# Patient Record
Sex: Male | Born: 1940
Health system: Southern US, Community
[De-identification: ages and names within clinical notes are randomized; demographics above are authoritative.]

## PROBLEM LIST (undated history)

## (undated) DIAGNOSIS — N201 Calculus of ureter: Secondary | ICD-10-CM

## (undated) DIAGNOSIS — N529 Male erectile dysfunction, unspecified: Secondary | ICD-10-CM

## (undated) DIAGNOSIS — Z87442 Personal history of urinary calculi: Secondary | ICD-10-CM

## (undated) DIAGNOSIS — M1990 Unspecified osteoarthritis, unspecified site: Secondary | ICD-10-CM

## (undated) DIAGNOSIS — E785 Hyperlipidemia, unspecified: Secondary | ICD-10-CM

## (undated) DIAGNOSIS — Z8719 Personal history of other diseases of the digestive system: Secondary | ICD-10-CM

## (undated) DIAGNOSIS — N390 Urinary tract infection, site not specified: Secondary | ICD-10-CM

## (undated) DIAGNOSIS — Z8639 Personal history of other endocrine, nutritional and metabolic disease: Secondary | ICD-10-CM

## (undated) DIAGNOSIS — I10 Essential (primary) hypertension: Secondary | ICD-10-CM

## (undated) DIAGNOSIS — Z860100 Personal history of colon polyps, unspecified: Secondary | ICD-10-CM

## (undated) DIAGNOSIS — A419 Sepsis, unspecified organism: Secondary | ICD-10-CM

## (undated) DIAGNOSIS — K219 Gastro-esophageal reflux disease without esophagitis: Secondary | ICD-10-CM

## (undated) DIAGNOSIS — K567 Ileus, unspecified: Secondary | ICD-10-CM

## (undated) DIAGNOSIS — R7303 Prediabetes: Secondary | ICD-10-CM

## (undated) DIAGNOSIS — N401 Enlarged prostate with lower urinary tract symptoms: Secondary | ICD-10-CM

## (undated) HISTORY — DX: Urinary tract infection, site not specified: N39.0

## (undated) HISTORY — DX: Ileus, unspecified: K56.7

## (undated) HISTORY — DX: Unspecified osteoarthritis, unspecified site: M19.90

## (undated) HISTORY — DX: Hyperlipidemia, unspecified: E78.5

## (undated) HISTORY — DX: Essential (primary) hypertension: I10

## (undated) HISTORY — PX: OTHER SURGICAL HISTORY: SHX169

## (undated) HISTORY — PX: ESOPHAGOGASTRODUODENOSCOPY (EGD) WITH ESOPHAGEAL DILATION: SHX5812

## (undated) HISTORY — DX: Gastro-esophageal reflux disease without esophagitis: K21.9

## (undated) HISTORY — DX: Sepsis, unspecified organism: A41.9

## (undated) HISTORY — PX: INGUINAL HERNIA REPAIR: SUR1180

---

## 1999-03-05 ENCOUNTER — Encounter: Payer: Self-pay | Admitting: *Deleted

## 1999-03-05 ENCOUNTER — Ambulatory Visit (HOSPITAL_COMMUNITY): Admission: RE | Admit: 1999-03-05 | Discharge: 1999-03-05 | Payer: Self-pay | Admitting: Pediatrics

## 1999-03-10 ENCOUNTER — Encounter: Payer: Self-pay | Admitting: *Deleted

## 1999-03-10 ENCOUNTER — Ambulatory Visit (HOSPITAL_COMMUNITY): Admission: RE | Admit: 1999-03-10 | Discharge: 1999-03-10 | Payer: Self-pay | Admitting: *Deleted

## 1999-03-10 ENCOUNTER — Encounter (INDEPENDENT_AMBULATORY_CARE_PROVIDER_SITE_OTHER): Payer: Self-pay

## 1999-11-24 ENCOUNTER — Encounter: Payer: Self-pay | Admitting: Neurosurgery

## 1999-11-24 ENCOUNTER — Encounter: Admission: RE | Admit: 1999-11-24 | Discharge: 1999-11-24 | Payer: Self-pay | Admitting: Neurosurgery

## 2004-04-28 ENCOUNTER — Ambulatory Visit: Payer: Self-pay

## 2005-01-14 ENCOUNTER — Ambulatory Visit: Payer: Self-pay | Admitting: Gastroenterology

## 2005-01-26 ENCOUNTER — Ambulatory Visit (HOSPITAL_BASED_OUTPATIENT_CLINIC_OR_DEPARTMENT_OTHER): Admission: RE | Admit: 2005-01-26 | Discharge: 2005-01-26 | Payer: Self-pay | Admitting: Urology

## 2005-01-26 ENCOUNTER — Ambulatory Visit (HOSPITAL_COMMUNITY): Admission: RE | Admit: 2005-01-26 | Discharge: 2005-01-26 | Payer: Self-pay | Admitting: Urology

## 2005-01-26 HISTORY — PX: OTHER SURGICAL HISTORY: SHX169

## 2005-01-29 ENCOUNTER — Ambulatory Visit (HOSPITAL_COMMUNITY): Admission: RE | Admit: 2005-01-29 | Discharge: 2005-01-29 | Payer: Self-pay | Admitting: Urology

## 2005-02-16 ENCOUNTER — Ambulatory Visit (HOSPITAL_COMMUNITY): Admission: RE | Admit: 2005-02-16 | Discharge: 2005-02-16 | Payer: Self-pay | Admitting: Urology

## 2005-02-17 ENCOUNTER — Ambulatory Visit: Payer: Self-pay | Admitting: Gastroenterology

## 2005-02-17 ENCOUNTER — Encounter (INDEPENDENT_AMBULATORY_CARE_PROVIDER_SITE_OTHER): Payer: Self-pay | Admitting: Specialist

## 2005-02-17 HISTORY — PX: COLONOSCOPY: SHX174

## 2005-05-04 ENCOUNTER — Ambulatory Visit (HOSPITAL_BASED_OUTPATIENT_CLINIC_OR_DEPARTMENT_OTHER): Admission: RE | Admit: 2005-05-04 | Discharge: 2005-05-04 | Payer: Self-pay | Admitting: Urology

## 2005-05-04 HISTORY — PX: CYSTOSCOPY W/ URETEROSCOPY W/ LITHOTRIPSY: SUR380

## 2007-06-13 ENCOUNTER — Ambulatory Visit: Payer: Self-pay | Admitting: Gastroenterology

## 2007-07-01 ENCOUNTER — Encounter: Payer: Self-pay | Admitting: Gastroenterology

## 2007-07-01 ENCOUNTER — Ambulatory Visit: Payer: Self-pay | Admitting: Gastroenterology

## 2008-04-16 ENCOUNTER — Ambulatory Visit (HOSPITAL_COMMUNITY): Admission: RE | Admit: 2008-04-16 | Discharge: 2008-04-16 | Payer: Self-pay | Admitting: Internal Medicine

## 2008-09-18 ENCOUNTER — Encounter: Payer: Self-pay | Admitting: Gastroenterology

## 2008-10-18 ENCOUNTER — Ambulatory Visit: Payer: Self-pay | Admitting: Gastroenterology

## 2008-10-18 DIAGNOSIS — K222 Esophageal obstruction: Secondary | ICD-10-CM

## 2008-10-18 HISTORY — DX: Esophageal obstruction: K22.2

## 2008-11-07 ENCOUNTER — Ambulatory Visit: Payer: Self-pay | Admitting: Gastroenterology

## 2009-12-25 ENCOUNTER — Ambulatory Visit (HOSPITAL_COMMUNITY): Admission: RE | Admit: 2009-12-25 | Discharge: 2009-12-25 | Payer: Self-pay | Admitting: Internal Medicine

## 2010-01-28 ENCOUNTER — Encounter: Payer: Self-pay | Admitting: Gastroenterology

## 2010-04-01 NOTE — Procedures (Signed)
Summary: Colonoscopy   Colonoscopy  Procedure date:  02/17/2005  Findings:      Results: Diverticulosis.       Pathology:  Hyperplastic polyp.     Location:  [facility].    Comments:      Repeat colonoscopy in 5 years.  Patient Name: [name] [name], [name] [name]. MRN:  Procedure Procedures: Colonoscopy CPT: [phone].    with Hot Biopsy(s)CPT: [id number].  Colorectal cancer screening, average risk CPT: G0121.  Personnel: Endoscopist: Barbette Hair. Arlyce Dice, MD.  Patient Consent: Procedure, Alternatives, Risks and Benefits discussed, consent obtained, from patient.  Indications  Average Risk Screening Routine.  History  Current Medications: Patient is not currently taking Coumadin.  Pre-Exam Physical: Performed Feb 17, 2005. Cardio-pulmonary exam, HEENT exam , Abdominal exam, Mental status exam WNL.  Comments: Patient history reviewed/updated, physical performed prior to initiation of sedation? Exam Exam: Extent of exam reached: Cecum, extent intended: Cecum.  The cecum was identified by IC valve. Colon retroflexion performed. ASA Classification: II. Tolerance: good.  Monitoring: Pulse and BP monitoring, Oximetry used. Supplemental O2 given. at 2 Liters.  Colon Prep Used Miralax for colon prep. Prep results: good.  Sedation Meds: Patient assessed and found to be appropriate for moderate (conscious) sedation. Sedation was managed by the Endoscopist. Fentanyl 100 mcg. given IV. Versed 10 mg. given IV.  Findings - DIVERTICULOSIS: Descending Colon to Sigmoid Colon. ICD9: Diverticulosis: 562.10. Comments: Diffuse diverticulosis.  NORMAL EXAM: Cecum.  - NORMAL EXAM: Cecum to Descending Colon.  POLYP: Rectum, Maximum size: 2 mm. Distance from Anus 4 cm. Procedure:  hot biopsy, ICD9: Colon Polyps: 211.3.  NORMAL EXAM: Rectum.   Assessment Abnormal examination, see findings above.  Diagnoses: 562.10: Diverticulosis.  211.3: Colon Polyps.   Events  Unplanned  Interventions: No intervention was required.  Unplanned Events: There were no complications. Plans  Post Exam Instructions: Post sedation instructions given.  Patient Education: Patient given standard instructions for: Polyps. Diverticulosis.  Disposition: After procedure patient sent to recovery. After recovery patient sent home.  Scheduling/Referral: Colonoscopy, to Barbette Hair. Arlyce Dice, MD, If adenoma, around Feb 17, 2010.    This report was created from the original endoscopy report, which was reviewed and signed by the above listed endoscopist.    cc. Shela Nevin

## 2010-04-01 NOTE — Letter (Signed)
Summary: Colonoscopy Date Change Letter  [facility]  [address] [place], [place] [address]   Phone: [phone]  Fax: [phone]      January 28, 2010 MRN: [id number]   BRYCETON HANTZ [address] [place], [place]  [address]   Dear Mr. FIDALGO,   Previously you were recommended to have a repeat colonoscopy around this time. Your chart was recently reviewed by Dr. Arlyce Dice of [facility]. Follow up colonoscopy is now recommended in 01-2015. This revised recommendation is based on current, nationally recognized guidelines for colorectal cancer screening and polyp surveillance. These guidelines are endorsed by the American Cancer Society, [employer] on Colorectal Cancer as well as numerous other major medical organizations.  Please understand that our recommendation assumes that you do not have any new symptoms such as bleeding, a change in bowel habits, anemia, or significant abdominal discomfort. If you do have any concerning GI symptoms or want to discuss the guideline recommendations, please call to arrange an office visit at your earliest convenience. Otherwise we will keep you in our reminder system and contact you 1-2 months prior to the date listed above to schedule your next colonoscopy.  Thank you,  Barbette Hair. Arlyce Dice, M.D.  [facility] [phone]

## 2010-06-11 ENCOUNTER — Other Ambulatory Visit (HOSPITAL_COMMUNITY): Payer: Self-pay | Admitting: Internal Medicine

## 2010-06-23 ENCOUNTER — Encounter (HOSPITAL_COMMUNITY)
Admission: RE | Admit: 2010-06-23 | Discharge: 2010-06-23 | Disposition: A | Discharge status: Home / Self Care | Payer: Medicare Other | Source: Ambulatory Visit | Attending: Internal Medicine | Admitting: Internal Medicine

## 2010-06-23 MED ORDER — TECHNETIUM TC 99M MEDRONATE IV KIT
25.0000 | PACK | Freq: Once | INTRAVENOUS | Status: AC | PRN
  Administered 2010-06-23: 26.5 via INTRAVENOUS

## 2010-07-08 ENCOUNTER — Telehealth: Payer: Self-pay | Admitting: Gastroenterology

## 2010-07-08 NOTE — Telephone Encounter (Signed)
Pts wife states that the pt is having difficulty swallowing. She would like to schedule him for an EGD with dil. Does pt need an OV or just a direct procedure. Please advise.

## 2010-07-09 ENCOUNTER — Encounter: Payer: Self-pay | Admitting: Gastroenterology

## 2010-07-09 NOTE — Telephone Encounter (Signed)
If no intervening medical problems he can be directly scheduled

## 2010-07-09 NOTE — Telephone Encounter (Signed)
Pt scheduled for previsit for 07/11/10@3 :30pm, EGD with dil scheduled for 07/21/10@1 :30pm. Pt aware of appt dates and times.

## 2010-07-11 ENCOUNTER — Ambulatory Visit (AMBULATORY_SURGERY_CENTER): Payer: Medicare Other | Admitting: *Deleted

## 2010-07-11 ENCOUNTER — Encounter: Payer: Self-pay | Admitting: Gastroenterology

## 2010-07-11 DIAGNOSIS — R1310 Dysphagia, unspecified: Secondary | ICD-10-CM

## 2010-07-15 NOTE — Letter (Signed)
June 13, 2007    Lucky Cowboy, M.D.  [address]  [place], [place] [address]   RE:  [name], [name]  MRN:  [id number]  /  DOB:  1940/12/17   Dear Dr. Oneta Rack:   Upon your kind referral, I had the pleasure of evaluating your patient  and I am pleased to offer my findings.  I saw [name] [name] in the office  today.  Enclosed is a copy of my progress note that details my findings  and recommendations.   Thank you for the opportunity to participate in your patient's care.    Sincerely,      Barbette Hair. Arlyce Dice, MD,FACG  Electronically Signed    RDK/MedQ  DD: 06/13/2007  DT: 06/13/2007  Job #: [id number]

## 2010-07-15 NOTE — Assessment & Plan Note (Signed)
[facility]                         GASTROENTEROLOGY OFFICE NOTE   [name] [name], [name] [name]                        MRN:          [id number]  DATE:06/13/2007                            DOB:          07-Feb-1941    REFERRING PHYSICIAN:  Lucky Cowboy, M.D.   PROBLEM:  Dysphagia.   [name] [name] is a pleasant 70 year old white male referred through the  courtesy of Dr. Oneta Rack for evaluation.  He has a long history of mild  pyrosis.  Over the past 4-5 months, he has been complaining of  intermittent dysphagia to solids.  He denies odynophagia.  Symptoms are  improved with Protonix, though they remain.  He tends to have a cough in  the mornings.   PAST MEDICAL HISTORY:  1. Hypertension.  2. Hypercholesterolemia.  3. He has a history of kidney stones for which he has undergone      lithotripsy therapy.  4. He has a history of colon polyps and diverticulosis.   FAMILY HISTORY:  Pertinent for a father with heart disease and a sister  with diabetes.   MEDICATIONS:  Baby aspirin, atenolol, and Protonix.   HE IS ALLERGIC TO IVP DYE.   He does not smoke.  He drinks about 1 drink a day.  He is married and  works in [employer], political party.   REVIEW OF SYSTEMS:  Reviewed and is positive for back pain and vision  changes, otherwise it is negative.   PHYSICAL EXAMINATION:  Pulse 56, blood pressure 122/84, weight 260.  HEENT: EOMI.  PERRLA.  Sclerae are anicteric.  Conjunctivae are pink.  NECK:  Supple without thyromegaly, adenopathy or carotid bruits.  CHEST:  Clear to auscultation and percussion without adventitious  sounds.  CARDIAC:  Regular rhythm; normal S1 S2.  There are no murmurs, gallops  or rubs.  ABDOMEN:  Bowel sounds are normoactive.  Abdomen is soft, nontender and  nondistended.  There are no abdominal masses, tenderness, splenic  enlargement or hepatomegaly.  EXTREMITIES:  Full range of motion.  No cyanosis, clubbing or edema.  RECTAL:  Deferred.  SKULL:  Exam shows no deformities.  NEUROLOGIC:  No focal abnormalities.   IMPRESSION:  1. Dysphagia - rule out early esophageal stricture.  2. Gastroesophageal reflux disease.  3. History of colon polyps and diverticulosis.  4. Hypertension.   RECOMMENDATION:  Upper endoscopy with dilatation as indicated.     [name] [name]. Arlyce Dice, MD,FACG  Electronically Signed    RDK/MedQ  DD: 06/13/2007  DT: 06/13/2007  Job #: [id number]   cc:   Lucky Cowboy, M.D.

## 2010-07-18 NOTE — Op Note (Signed)
NAME:  [name] [name], [name] [name] NO.:  [id number]   MEDICAL RECORD NO.:  [id number]          PATIENT TYPE:  AMB   LOCATION:  [facility]                         FACILITY:  [facility]   PHYSICIAN:  Bertram Millard. Dahlstedt, M.D.DATE OF BIRTH:  1940-07-18   DATE OF PROCEDURE:  01/26/2005  DATE OF DISCHARGE:                                 OPERATIVE REPORT   PREOPERATIVE DIAGNOSIS:  Large left ureteropelvic junction stone.   POSTOPERATIVE DIAGNOSIS:  Large left ureteropelvic junction stone.   PRINCIPAL PROCEDURE:  Cystoscopy, left retrograde ureteral pyelogram, double-  J stent placement on the left.   SURGEON:  Bertram Millard. Dahlstedt, M.D.   ANESTHESIA:  General with LMA.   COMPLICATIONS:  None.   BRIEF HISTORY:  A 70 year old male with a history of hematuria. He was  recently evaluated and found to have a large left ureteral stone at the UPJ.  This was approximately 2 cm in length. It is a slender stone, however.   It was recommended when this diagnosis was made that the patient undergo  either cystoscopy with double-J stent followed by lithotripsy versus  lithotripsy in situ. I have recommended, due to the stone bulk, that he have  a double-J stent placed beforehand. He desires to proceed with this  recommendation.   DESCRIPTION OF PROCEDURE:  The patient was administered general anesthetic  using the LMA. He was placed in the dorsal lithotomy position. Genitalia and  perineum were prepped and draped. A 22-French panendoscope was advanced into  the bladder which was found to be normal. The left ureteral orifice was  cannulated with a 6-French open-end catheter and a retrograde ureteral  pyelogram performed.   The retrograde ureteral pyelogram revealed a normal ureter. There was a  filling defect in the proximal ureter. There was moderate hydronephrosis  proximal to this.   A guidewire was then advanced through the open-ended catheter and guided by  the stone. The catheter was  removed and a double-J stent was placed. The  stent size was 6 [place] x 26 cm. Good proximal and distal curls were seen  using fluoroscopic and cystoscopic guidance. The bladder was drained and the  scope was removed.   The patient tolerated the procedure well. A B&O suppository was placed.   He was discharged on Bactrim DS 1 p.o. b.i.d. for 3 days and oxybutynin 5 mg  1 p.o. q.8 h p.r.n. bladder frequency. He will follow-up with lithotripsy in  approximately 3 days.      Bertram Millard. Dahlstedt, M.D.  Electronically Signed     SMD/MEDQ  D:  01/26/2005  T:  01/26/2005  Job:  [id number]

## 2010-07-18 NOTE — Op Note (Signed)
NAME:  [name] [name], [name] [name] NO.:  [id number]   MEDICAL RECORD NO.:  [id number]          PATIENT TYPE:  AMB   LOCATION:  [facility]                         FACILITY:  [facility]   PHYSICIAN:  Bertram Millard. Dahlstedt, M.D.DATE OF BIRTH:  06/07/1940   DATE OF PROCEDURE:  05/04/2005  DATE OF DISCHARGE:                                 OPERATIVE REPORT   PREOPERATIVE DIAGNOSIS:  Ureteral calculi.   POSTOPERATIVE DIAGNOSIS:  Ureteral calculi.   PROCEDURES:  1.  Cytoscopy.  2.  Left ureteroscopy.  3.  Holmium laser of ureteral calculi.  4.  Extraction of left ureteral calculi.  5.  Double-J stent placement.   SURGEON:  Bertram Millard. Dahlstedt, M.D.   ANESTHESIA:  General.   COMPLICATIONS:  None.   SPECIMENS:  Ureteral stone.   BRIEF HISTORY:  This nice 70 year old gentleman is status post 2 prior  lithotripsies for an obstructing left upper ureteral stone. He has had  fragmentation, but insufficient passage of the calculi. He was recently  seen; and he has persistent stones in his left kidney, but does not have  significant symptomatology.   With the patient having these stones for several months, it was recommended  that he undergo left ureteroscopy and stone extraction, with the possible  use of the holmium laser. He accepts the procedures as well as the risks and  complications and desires to proceed.   DESCRIPTION OF PROCEDURE:  The patient was administered preoperative IV  antibiotics. He was identified; and the surgical side marked in the holding  area. He was brought to the operating room where a general anesthetic was  administered. He was placed in the dorsal lithotomy position. Genitalia and  the perineum were prepped and draped. A 22-French panendoscope was advanced  into his bladder. Prostate was not obstructive. Left ureteral orifice was  identified; and there was a stone peeking through it. A sensor tip guidewire  was then passed up into the left renal pelvis using  fluoroscopic guidance.   The 6-French, short ureteroscope was passed. The distal ureteral stone was  extracted. The ureteroscope was then, again, advanced up to the stones in  the mid ureter at the pelvic brim. The several small 3-4 mm fragments were  grasped with the nitinol basket and extracted. There was an approximate 8-10  mm stone that was farther up. This was fragmented using the holmium laser  into several smaller fragments. With some difficulty, these were then  basketed and extracted. Several minimal/dusk-like fragments were left. These  were not extracted. Altogether, approximately a dozen fragments were removed  from the left ureter.   No significant ureteral trauma was identified. I then placed a 26 cm x 5-  French Polaris stent over the guidewire which had been left indwelling. The  guidewire was removed. A good proximal curl was seen. The small fronds were  seen coming through the left ureteral orifice. The bladder was drained. The  scope was removed, and the procedure terminated.   The patient tolerated the procedure well. He was awakened, extubated, and  taken to PACU in stable condition.  Bertram Millard. Dahlstedt, M.D.  Electronically Signed     SMD/MEDQ  D:  05/04/2005  T:  05/04/2005  Job:  [id number]

## 2010-07-21 ENCOUNTER — Ambulatory Visit (AMBULATORY_SURGERY_CENTER): Payer: Medicare Other | Admitting: Gastroenterology

## 2010-07-21 ENCOUNTER — Encounter: Payer: Self-pay | Admitting: Gastroenterology

## 2010-07-21 VITALS — BP 131/87 | HR 69 | Temp 98.6°F | Resp 16 | Ht 71.0 in | Wt 260.0 lb

## 2010-07-21 DIAGNOSIS — K222 Esophageal obstruction: Secondary | ICD-10-CM

## 2010-07-21 DIAGNOSIS — R1310 Dysphagia, unspecified: Secondary | ICD-10-CM

## 2010-07-21 MED ORDER — SODIUM CHLORIDE 0.9 % IV SOLN: 500.0000 mL | INTRAVENOUS | Status: DC

## 2010-07-21 NOTE — Patient Instructions (Signed)
Dilation diet given to pt and care partner  Handout given on strictures  Continue medicines as directed  Call as needed for problems

## 2010-07-22 ENCOUNTER — Telehealth: Payer: Self-pay | Admitting: *Deleted

## 2010-07-22 NOTE — Telephone Encounter (Signed)

## 2011-06-01 ENCOUNTER — Other Ambulatory Visit: Payer: Self-pay | Admitting: Internal Medicine

## 2011-06-01 DIAGNOSIS — N6452 Nipple discharge: Secondary | ICD-10-CM

## 2011-06-04 ENCOUNTER — Ambulatory Visit
Admission: RE | Admit: 2011-06-04 | Discharge: 2011-06-04 | Disposition: A | Discharge status: Home / Self Care | Payer: Medicare Other | Source: Ambulatory Visit | Attending: Internal Medicine | Admitting: Internal Medicine

## 2011-06-04 DIAGNOSIS — N6452 Nipple discharge: Secondary | ICD-10-CM

## 2012-12-27 ENCOUNTER — Other Ambulatory Visit: Payer: Self-pay | Admitting: Emergency Medicine

## 2012-12-27 ENCOUNTER — Ambulatory Visit (HOSPITAL_COMMUNITY)
Admission: RE | Admit: 2012-12-27 | Discharge: 2012-12-27 | Disposition: A | Discharge status: Home / Self Care | Payer: Medicare Other | Source: Ambulatory Visit | Attending: Emergency Medicine | Admitting: Emergency Medicine

## 2012-12-27 DIAGNOSIS — IMO0001 Reserved for inherently not codable concepts without codable children: Secondary | ICD-10-CM | POA: Insufficient documentation

## 2012-12-27 DIAGNOSIS — M259 Joint disorder, unspecified: Secondary | ICD-10-CM | POA: Insufficient documentation

## 2012-12-27 DIAGNOSIS — M25552 Pain in left hip: Secondary | ICD-10-CM

## 2012-12-27 DIAGNOSIS — M79609 Pain in unspecified limb: Secondary | ICD-10-CM | POA: Insufficient documentation

## 2013-01-28 ENCOUNTER — Other Ambulatory Visit: Payer: Self-pay | Admitting: Internal Medicine

## 2013-02-19 ENCOUNTER — Other Ambulatory Visit: Payer: Self-pay | Admitting: Internal Medicine

## 2013-03-12 ENCOUNTER — Encounter: Payer: Self-pay | Admitting: *Deleted

## 2013-03-16 ENCOUNTER — Encounter: Payer: Self-pay | Admitting: Emergency Medicine

## 2013-03-16 ENCOUNTER — Ambulatory Visit (INDEPENDENT_AMBULATORY_CARE_PROVIDER_SITE_OTHER): Payer: Medicare Other | Admitting: Emergency Medicine

## 2013-03-16 VITALS — BP 124/62 | HR 64 | Temp 98.2°F | Resp 18 | Ht 71.5 in | Wt 264.0 lb

## 2013-03-16 DIAGNOSIS — R7309 Other abnormal glucose: Secondary | ICD-10-CM

## 2013-03-16 DIAGNOSIS — E669 Obesity, unspecified: Secondary | ICD-10-CM

## 2013-03-16 DIAGNOSIS — E785 Hyperlipidemia, unspecified: Secondary | ICD-10-CM

## 2013-03-16 DIAGNOSIS — N200 Calculus of kidney: Secondary | ICD-10-CM

## 2013-03-16 DIAGNOSIS — K219 Gastro-esophageal reflux disease without esophagitis: Secondary | ICD-10-CM

## 2013-03-16 DIAGNOSIS — M1990 Unspecified osteoarthritis, unspecified site: Secondary | ICD-10-CM

## 2013-03-16 DIAGNOSIS — E782 Mixed hyperlipidemia: Secondary | ICD-10-CM

## 2013-03-16 DIAGNOSIS — I10 Essential (primary) hypertension: Secondary | ICD-10-CM

## 2013-03-16 LAB — CBC WITH DIFFERENTIAL/PLATELET
Basophils Absolute: 0 10*3/uL (ref 0.0–0.1)
Basophils Relative: 1 % (ref 0–1)
Eosinophils Absolute: 0.5 10*3/uL (ref 0.0–0.7)
Eosinophils Relative: 6 % — ABNORMAL HIGH (ref 0–5)
HCT: 46.4 % (ref 39.0–52.0)
Hemoglobin: 16.4 g/dL (ref 13.0–17.0)
Lymphocytes Relative: 25 % (ref 12–46)
Lymphs Abs: 2.1 10*3/uL (ref 0.7–4.0)
MCH: 33.1 pg (ref 26.0–34.0)
MCHC: 35.3 g/dL (ref 30.0–36.0)
MCV: 93.5 fL (ref 78.0–100.0)
Monocytes Absolute: 0.8 10*3/uL (ref 0.1–1.0)
Monocytes Relative: 10 % (ref 3–12)
Neutro Abs: 4.8 10*3/uL (ref 1.7–7.7)
Neutrophils Relative %: 58 % (ref 43–77)
Platelets: 298 10*3/uL (ref 150–400)
RBC: 4.96 MIL/uL (ref 4.22–5.81)
RDW: 13.1 % (ref 11.5–15.5)
WBC: 8.1 10*3/uL (ref 4.0–10.5)

## 2013-03-16 NOTE — Patient Instructions (Signed)

## 2013-03-16 NOTE — Progress Notes (Signed)
Subjective:    Patient ID: [name] [name], male    DOB: Jan 30, 1941, 73 y.o.   MRN: [id number]  HPI Comments: 73 yo Obese male presents for 3 month F/U for HTN, Cholesterol, Pre-Dm, D. Deficient. He notes he eats healthy except for his wine intake. He is not exercising. He notes BP at home is good. LAST LABS T 188 TG 152 L 103 A1C 6.1  L 70 MAG 1.7 INSULIN 43 HE WANTS RECOMMENDATIONS ON PERSONAL TRAINERS TO HELP WITH WT LOSS.  He notes a long history of bilateral nipple sensitivity since falling in icy water. He notes he has had neg mammo of both Breasts. He notes symptoms are worse when cold outside and very hard to wear clothing due to pain. He notes no change in symptoms x several years.  Hypertension  Hyperlipidemia  Gastrophageal Reflux   Current Outpatient Prescriptions on File Prior to Visit  Medication Sig Dispense Refill  . aspirin 81 MG tablet Take 81 mg by mouth daily.        . B Complex Vitamins (VITAMIN-B COMPLEX PO) Take 1 capsule by mouth daily.        . bisoprolol-hydrochlorothiazide (ZIAC) 5-6.25 MG per tablet TAKE 1 TABLET BY MOUTH EVERY MORNING FOR BLOODPRESSURE  90 tablet  1  . fenofibrate micronized (LOFIBRA) 134 MG capsule TAKE ONE CAPSULE BY MOUTH EVERY DAY  30 capsule  6  . fish oil-omega-3 fatty acids 1000 MG capsule Take by mouth daily.        . Flaxseed, Linseed, (FLAXSEED OIL PO) Take 1 capsule by mouth daily.        Marland Kitchen losartan (COZAAR) 100 MG tablet Take 1 capsule by mouth Daily.      Marland Kitchen NEXIUM 40 MG capsule Take 40 mg by mouth Daily.       Current Facility-Administered Medications on File Prior to Visit  Medication Dose Route Frequency Provider Last Rate Last Dose  . 0.9 %  sodium chloride infusion  500 mL Intravenous Continuous Inda Castle, MD       ALLERGIES Iohexol and Ace inhibitors  Past Medical History  Diagnosis Date  . Hyperlipidemia   . Hypertension   . GERD (gastroesophageal reflux disease)   . Kidney stones   . Arthritis        Review of Systems  All other systems reviewed and are negative.   BP 124/62  Pulse 64  Temp(Src) 98.2 F (36.8 C) (Temporal)  Resp 18  Ht 5' 11.5" (1.816 m)  Wt 264 lb (119.75 kg)  BMI 36.31 kg/m2     Objective:   Physical Exam  Nursing note and vitals reviewed. Constitutional: He is oriented to person, place, and time. He appears well-developed and well-nourished.  Obese  HENT:  Head: Normocephalic and atraumatic.  Right Ear: External ear normal.  Left Ear: External ear normal.  Nose: Nose normal.  Eyes: Conjunctivae and EOM are normal.  Neck: Normal range of motion. Neck supple. No JVD present. No thyromegaly present.  Cardiovascular: Normal rate, regular rhythm, normal heart sounds and intact distal pulses.   Pulmonary/Chest: Effort normal and breath sounds normal.  Abdominal: Soft. Bowel sounds are normal. He exhibits no distension and no mass. There is no tenderness. There is no rebound and no guarding.  Genitourinary:  No obvious breast mass or tenderness  Musculoskeletal: Normal range of motion. He exhibits no edema and no tenderness.  Lymphadenopathy:    He has no cervical adenopathy.  Neurological: He is  alert and oriented to person, place, and time. He has normal reflexes. No cranial nerve deficit. Coordination normal.  Skin: Skin is warm and dry.  Psychiatric: He has a normal mood and affect. His behavior is normal. Judgment and thought content normal.          Assessment & Plan:  1.  3 month F/U for Obesity, HTN, Cholesterol, Pre-Dm, D. Deficient. Needs healthy diet, cardio QD and obtain healthy weight. Check Labs, Check BP if >130/80 call office. W/C WITH LIST OF PERSONAL TRAINERS FOR WT LOSS 2. Lyrica 50 SX#21  for ? Nipple sensitvity/ ?nerve damage. W/c with results

## 2013-03-17 LAB — BASIC METABOLIC PANEL WITH GFR
BUN: 17 mg/dL (ref 6–23)
CO2: 24 mEq/L (ref 19–32)
Calcium: 11.1 mg/dL — ABNORMAL HIGH (ref 8.4–10.5)
Chloride: 101 mEq/L (ref 96–112)
Creat: 1.25 mg/dL (ref 0.50–1.35)
GFR, Est African American: 67 mL/min
GFR, Est Non African American: 58 mL/min — ABNORMAL LOW
Glucose, Bld: 122 mg/dL — ABNORMAL HIGH (ref 70–99)
Potassium: 4.4 mEq/L (ref 3.5–5.3)
Sodium: 137 mEq/L (ref 135–145)

## 2013-03-17 LAB — LIPID PANEL
Cholesterol: 197 mg/dL (ref 0–200)
HDL: 49 mg/dL (ref >39)
LDL Cholesterol: 105 mg/dL — ABNORMAL HIGH (ref 0–99)
Total CHOL/HDL Ratio: 4 Ratio
Triglycerides: 213 mg/dL — ABNORMAL HIGH (ref <150)
VLDL: 43 mg/dL — ABNORMAL HIGH (ref 0–40)

## 2013-03-17 LAB — INSULIN, FASTING: Insulin fasting, serum: 90 u[IU]/mL — ABNORMAL HIGH (ref 3–28)

## 2013-03-17 LAB — HEPATIC FUNCTION PANEL
ALT: 35 U/L (ref 0–53)
AST: 31 U/L (ref 0–37)
Albumin: 4.2 g/dL (ref 3.5–5.2)
Alkaline Phosphatase: 45 U/L (ref 39–117)
Bilirubin, Direct: 0.2 mg/dL (ref 0.0–0.3)
Indirect Bilirubin: 0.6 mg/dL (ref 0.0–0.9)
Total Bilirubin: 0.8 mg/dL (ref 0.3–1.2)
Total Protein: 6.9 g/dL (ref 6.0–8.3)

## 2013-03-20 ENCOUNTER — Encounter: Payer: Self-pay | Admitting: Emergency Medicine

## 2013-03-20 ENCOUNTER — Telehealth: Payer: Self-pay | Admitting: *Deleted

## 2013-03-20 DIAGNOSIS — N200 Calculus of kidney: Secondary | ICD-10-CM | POA: Insufficient documentation

## 2013-03-20 DIAGNOSIS — E785 Hyperlipidemia, unspecified: Secondary | ICD-10-CM | POA: Insufficient documentation

## 2013-03-20 DIAGNOSIS — E669 Obesity, unspecified: Secondary | ICD-10-CM | POA: Insufficient documentation

## 2013-03-20 DIAGNOSIS — I10 Essential (primary) hypertension: Secondary | ICD-10-CM | POA: Insufficient documentation

## 2013-03-20 DIAGNOSIS — K219 Gastro-esophageal reflux disease without esophagitis: Secondary | ICD-10-CM | POA: Insufficient documentation

## 2013-03-20 NOTE — Telephone Encounter (Signed)
Message copied by Oneita Hurt on Mon Mar 20, 2013  2:59 PM ------      Message from: [place], [place] R      Created: Mon Mar 20, 2013  7:43 AM       Triglycerides, Cholesterol, Insulin  elevated need better/ healthier diet, cardio and weight loss. Needs decrease Carbohydrates including alcohol and fried foods. Add Fish oil 1000mg . Can add metformin 500 mg to help with insulin level and wt loss. Rx sent to pharmacy if desires.             ------

## 2013-03-23 NOTE — Telephone Encounter (Signed)
Spoke with patient about recent lab results and instructions.

## 2013-04-24 ENCOUNTER — Telehealth: Payer: Self-pay | Admitting: *Deleted

## 2013-04-24 NOTE — Telephone Encounter (Signed)
Spouse called regarding Lamisil RX .  Patient states he felt dizzy after taking med and asked about a different RX. Per DR Melford Aase, very unlikely due to med and suggested try it again.  Spouse aware.

## 2013-05-16 ENCOUNTER — Other Ambulatory Visit: Payer: Self-pay | Admitting: Internal Medicine

## 2013-06-02 ENCOUNTER — Encounter: Payer: Self-pay | Admitting: Physician Assistant

## 2013-06-02 ENCOUNTER — Ambulatory Visit (INDEPENDENT_AMBULATORY_CARE_PROVIDER_SITE_OTHER): Payer: Medicare Other | Admitting: Physician Assistant

## 2013-06-02 VITALS — BP 138/80 | HR 68 | Temp 98.4°F | Resp 16 | Ht 71.5 in | Wt 260.0 lb

## 2013-06-02 DIAGNOSIS — Z1331 Encounter for screening for depression: Secondary | ICD-10-CM

## 2013-06-02 DIAGNOSIS — E669 Obesity, unspecified: Secondary | ICD-10-CM

## 2013-06-02 DIAGNOSIS — Z0000 Encounter for general adult medical examination without abnormal findings: Secondary | ICD-10-CM

## 2013-06-02 DIAGNOSIS — J329 Chronic sinusitis, unspecified: Secondary | ICD-10-CM

## 2013-06-02 DIAGNOSIS — E785 Hyperlipidemia, unspecified: Secondary | ICD-10-CM

## 2013-06-02 MED ORDER — PREDNISONE 20 MG PO TABS: ORAL_TABLET | ORAL | Status: DC

## 2013-06-02 MED ORDER — AZITHROMYCIN 250 MG PO TABS: ORAL_TABLET | ORAL | Status: DC

## 2013-06-02 NOTE — Progress Notes (Signed)
Subjective:  [name] [name] is a 73 y.o. male who presents for Medicare Annual Wellness Visit and sick visit. Date of last medicare wellness visit was is unknown.  His blood pressure has been controlled at home, today their BP is BP: 138/80 mmHg He does workout, walks with his wife. He denies chest pain, shortness of breath, dizziness.  Last 1-2 weeks he has been having cough, states [place] with air [place] and has developed a cough, clear/yellow mucus, wheezing, denies fever, chills, PND, orthopnea, edema.   Names of Other Physician/Practitioners you currently use: 1. [facility] here for primary care 2. Dr. Katy Fitch, eye doctor, last visit 06/2012 3. Dr. Philipp Ovens, dentist, last visit 10/2012 Patient Care Team: Unk Pinto, MD as PCP - General (Internal Medicine) Newt Minion, MD as Consulting Physician (Orthopedic Surgery) Inda Castle, MD as Consulting Physician (Gastroenterology) Franchot Gallo, MD as Consulting Physician (Urology)  Medication Review: Current Outpatient Prescriptions on File Prior to Visit  Medication Sig Dispense Refill  . aspirin 81 MG tablet Take 81 mg by mouth daily.        . B Complex Vitamins (VITAMIN-B COMPLEX PO) Take 1 capsule by mouth daily.        . bisoprolol-hydrochlorothiazide (ZIAC) 5-6.25 MG per tablet TAKE 1 TABLET BY MOUTH EVERY MORNING FOR BLOODPRESSURE  90 tablet  1  . Cholecalciferol (VITAMIN D) 2000 UNITS tablet Take 10,000 Units by mouth daily.      . fenofibrate micronized (LOFIBRA) 134 MG capsule TAKE ONE CAPSULE BY MOUTH EVERY DAY  30 capsule  6  . fish oil-omega-3 fatty acids 1000 MG capsule Take by mouth daily.        . Flaxseed, Linseed, (FLAXSEED OIL PO) Take 1 capsule by mouth daily.        Marland Kitchen losartan (COZAAR) 100 MG tablet TAKE 1 TABLET BY MOUTH EVERY DAY FOR BLOOD PRESSURE  90 tablet  2  . Magnesium 250 MG TABS Take 250 mg by mouth daily.      Marland Kitchen NEXIUM 40 MG capsule  Take 40 mg by mouth Daily.       Current Facility-Administered Medications on File Prior to Visit  Medication Dose Route Frequency Provider Last Rate Last Dose  . 0.9 %  sodium chloride infusion  500 mL Intravenous Continuous Inda Castle, MD        Current Problems (verified) Patient Active Problem List   Diagnosis Date Noted  . Hyperlipidemia   . Hypertension   . GERD (gastroesophageal reflux disease)   . Kidney stones   . Arthritis   . Obese   . STRICTURE AND STENOSIS OF ESOPHAGUS 10/18/2008  . COUGH 10/18/2008    Screening Tests Health Maintenance  Topic Date Due  . Colonoscopy  02/07/1993  . Influenza Vaccine  09/30/2013  . Tetanus/tdap  03/03/2015  . Pneumococcal Polysaccharide Vaccine Age 77 And Over  Completed  . Zostavax  Completed    Immunization History  Administered Date(s) Administered  . Influenza Split 12/27/2012  . Pneumococcal Polysaccharide-23 03/02/2008  . Td 03/02/2005  . Zoster 03/02/2006    Preventative care: Last colonoscopy: 2008 due 5 years EGD- every 2 years, last time was 2012 with dilitation  Prior vaccinations: TD or Tdap: 2007  Influenza: 11/2012  Pneumococcal: 2010 Shingles/Zostavax: 2008  History reviewed: allergies, current medications, past family history, past medical history, past social history, past surgical history and problem list   Risk Factors: Tobacco History  Substance Use Topics  . Smoking status: Former Smoker    Quit date: 03/02/1974  . Smokeless tobacco: Not on file  . Alcohol Use: 4.2 oz/week    7 Glasses of wine per week   He does not smoke.  Patient is a former smoker. Are there smokers in your home (other than you)?  No  Alcohol Current alcohol use: glass of wine with dinner  Caffeine Current caffeine use: coffee 1-2 /day  Exercise Current exercise habits: walking  Current exercise: walking  Nutrition/Diet Current diet: in general, a "healthy" diet    Cardiac risk factors: advanced age  (older than 35 for men, 61 for women), dyslipidemia, family history of premature cardiovascular disease, hypertension, male gender, obesity (BMI >= 30 kg/m2) and sedentary lifestyle.  Depression Screen (Note: if answer to either of the following is "Yes", a more complete depression screening is indicated)   Q1: Over the past two weeks, have you felt down, depressed or hopeless? No  Q2: Over the past two weeks, have you felt little interest or pleasure in doing things? No  Have you lost interest or pleasure in daily life? No  Do you often feel hopeless? No  Do you cry easily over simple problems? No  Activities of Daily Living In your present state of health, do you have any difficulty performing the following activities?:  Driving? No Managing money?  No Feeding yourself? No Getting from bed to chair? No Climbing a flight of stairs? No Preparing food and eating?: No Bathing or showering? No Getting dressed: No Getting to the toilet? No Using the toilet:No Moving around from place to place: No In the past year have you fallen or had a near fall?:No   Are you sexually active?  No  Do you have more than one partner?  No  Vision Difficulties: No  Hearing Difficulties: No Do you often ask people to speak up or repeat themselves? No Do you experience ringing or noises in your ears? No Do you have difficulty understanding soft or whispered voices? No  Cognition  Do you feel that you have a problem with memory?No  Do you often misplace items? No  Do you feel safe at home?  Yes  Advanced directives Does patient have a Justice? Yes Does patient have a Living Will? Yes   Objective:   Blood pressure 138/80, pulse 68, temperature 98.4 F (36.9 C), resp. rate 16, height 5' 11.5" (1.816 m), weight 260 lb (117.935 kg). Body mass index is 35.76 kg/(m^2).  General appearance: alert, no distress, WD/WN, male Cognitive Testing  Alert? Yes  Normal  Appearance?Yes  Oriented to person? Yes  Place? Yes   Time? Yes  Recall of three objects?  Yes  Can perform simple calculations? Yes  Displays appropriate judgment?Yes  Can read the correct time from a watch face?Yes  HEENT: normocephalic, sclerae anicteric, TMs pearly, nares patent, no discharge or erythema, pharynx normal Oral cavity: MMM, no lesions Neck: supple, no lymphadenopathy, no thyromegaly, no masses Heart: RRR, normal S1, S2, no murmurs Lungs: CTA bilaterally, no wheezes, rhonchi, or rales Abdomen: +bs, soft, obese, non tender, non distended, no masses, no hepatomegaly, no splenomegaly Musculoskeletal: nontender, no swelling, no obvious deformity Extremities: no edema, no cyanosis, no clubbing Pulses: 2+ symmetric, upper and lower extremities, normal cap refill Neurological: alert, oriented x 3, CN2-12 intact, strength normal upper extremities and lower extremities, sensation normal throughout, DTRs 2+ throughout, no cerebellar signs, gait normal Psychiatric: normal affect,  behavior normal, pleasant   Assessment:   Bronchitis- zpak, prednisone 20mg  #10.  Obesity- discussed bringing in a diary, diet, exercise, discussed diet medications such as phentermine PreDM- will think about getting on Metformin, information given to him.   Plan:   During the course of the visit the patient was educated and counseled about appropriate screening and preventive services including:    Pneumococcal vaccine   Influenza vaccine  Td vaccine  Screening electrocardiogram  Colorectal cancer screening  Diabetes screening  Glaucoma screening  Nutrition counseling   Screening recommendations, referrals: Vaccinations: Tdap vaccine not indicated Influenza vaccine will get in Aug/Sept Pneumococcal vaccine not indicated Shingles vaccine not indicated Hep B vaccine not indicated  Nutrition assessed and recommended  Colonoscopy requested Recommended yearly ophthalmology/optometry  visit for glaucoma screening and checkup Recommended yearly dental visit for hygiene and checkup Advanced directives - has them done  Conditions/risks identified: BMI: Discussed weight loss, diet, and increase physical activity.  Increase physical activity: AHA recommends 150 minutes of physical activity a week.  Medications reviewed Diabetes is not at goal, ACE/ARB therapy: Yes. Urinary Incontinence is not an issue: discussed non pharmacology and pharmacology options.  Fall risk: low- discussed PT, home fall assessment, medications.    Medicare Attestation I have personally reviewed: The patient's medical and social history Their use of alcohol, tobacco or illicit drugs Their current medications and supplements The patient's functional ability including ADLs,fall risks, home safety risks, cognitive, and hearing and visual impairment Diet and physical activities Evidence for depression or mood disorders  The patient's weight, height, BMI, and visual acuity have been recorded in the chart.  I have made referrals, counseling, and provided education to the patient based on review of the above and I have provided the patient with a written personalized care plan for preventive services.     Vicie Mutters, PA-C   06/02/2013

## 2013-06-02 NOTE — Patient Instructions (Addendum)
Call Dr. [name]   Your A1C is in the prediabetic range at 6.1 with 6.5 or higher being diabetic but your insulin is also very high. This can make it hard to lose weight, crave carbs but when you eat carbs you crash.  Please make sure you decrease bad carbs like white bread, white rice, potatoes, corn, soft drinks, pasta, cereals, refined sugars, sweet tea, dried fruits, and fruit juice. Good carbs are okay to eat in moderation like sweet potatoes, brown rice, whole grain pasta/bread, most fruit (expect dried fruit) and you can eat as many veggies as you want. Also sometimes we will put patients on metformin for elevated insulin. We can discuss at next visit but here is some information about metformin. We start Metformin to prevent or treat diabetes. Metformin does not cause low blood sugars. In order to create energy your cells need insulin and sugar but sometime your cells do not accept the insulin and this can cause increased sugars and decreased energy. The Metformin helps your cells accept insulin and the sugar to give you more energy.   The two most common side effects are nausea and diarrhea, follow these rules to avoid it! You can take imodium per box instructions when starting metformin if needed.   Rules of metformin: 1) start out slow with only one pill daily. Our goal for you is 4 pills a day or 2000mg  total.  2) take with your largest meal. 3) Take with least amount of carbs.     Bad carbs also include fruit juice, alcohol, and sweet tea. These are empty calories that do not signal to your brain that you are full.   Please remember the good carbs are still carbs which convert into sugar. So please measure them out no more than 1/2-1 cup of rice, oatmeal, pasta, and beans.  Veggies are however free foods! Pile them on.   I like lean protein at every meal such as chicken, [place], pork chops, cottage cheese, etc. Just do not fry these meats and please center your meal around vegetable,  the meats should be a side dish.   No all fruit is created equal. Please see the list below, the fruit at the bottom is higher in sugars than the fruit at the top     The majority of colds are caused by viruses and do not require antibiotics. Please read the rest of this hand out to learn more about the common cold and what you can do to help yourself as well as help prevent the over use of antibiotics.   COMMON COLD SIGNS AND SYMPTOMS - The common cold usually causes nasal congestion, runny nose, and sneezing. A sore throat may be present on the first day but usually resolves quickly. If a cough occurs, it generally develops on about the fourth or fifth day of symptoms, typically when congestion and runny nose are resolving  COMMON COLD COMPLICATIONS - In most cases, colds do not cause serious illness or complications. Most colds last for three to seven days, although many people continue to have symptoms (coughing, sneezing, congestion) for up to two weeks.  One of the more common complications is sinusitis, which is usually caused by viruses and rarely (about 2 percent of the time) by bacteria. Having thick or yellow to green-colored nasal discharge does not mean that bacterial sinusitis has developed; discolored nasal discharge is a normal phase of the common cold.  Lower respiratory infections, such as pneumonia or bronchitis, may develop  following a cold.  Infection of the middle ear, or otitis media, can accompany or follow a cold.  COMMON COLD TREATMENT - There is no specific treatment for the viruses that cause the common cold. Most treatments are aimed at relieving some of the symptoms of the cold, but do not shorten or cure the cold. Antibiotics are not useful for treating the common cold; antibiotics are only used to treat illnesses caused by bacteria, not viruses. Unnecessary use of antibiotics for the treatment of the common cold can cause allergic reactions, diarrhea, or other  gastrointestinal symptoms in some patients.  The symptoms of a cold will resolve over time, even without any treatment. People with underlying medical conditions and those who use other over-the-counter or prescription medications should speak with their healthcare provider or pharmacist to ensure that it is safe to use these treatments. The following are treatments that may reduce the symptoms caused by the common cold.  Nasal congestion - Decongestants are good for nasal congestion- if you feel very stuffy but no mucus is coming out, this is the medication that will help you the most.  Pseudoephedrine is a decongestant that can improve nasal congestion. Although a prescription is not required, drugstores in the [place] keep pseudoephedrine behind the counter, so it must be requested from a pharmacist. If you have a heart condition or high blood pressure please use Coricidin BPH instead.   Runny nose - Antihistamines such as diphenhydramine (Benadryl), certazine (Zyrtec) which are best taking at night because they can make you tired OR loratadine (Claritin),  fexafinadine (Allegra) help with a runny nose.   Nasal sprays such an oxymetazoline (Afrin and others) may also give temporary relief of nasal congestion. However, these sprays should never be used for more than two to three days; use for more than three days use can worsen congestion.  Nasocort is now over the counter and can help decrease a runny nose. Please stop the medication if you have blurry vision or nose bleeds.   Sore throat and headache - Sore throat and headache are best treated with a mild pain reliever such as acetaminophen (Tylenol) or a non-steroidal anti-inflammatory agent such as ibuprofen or naproxen (Motrin or Aleve). These medications should be taken with food to prevent stomach problems. As well as gargling with warm water and salt.   Cough - Common cough medicine ingredients include guaifenesin and dextromethorphan;  these are often combined with other medications in over-the-counter cold formulas. Often a cough is worse at night or first in the morning due to post nasal drip from you nose. You can try to sleep at an angle to decrease a cough.   Alternative treatments - Heated, humidified air can improve symptoms of nasal congestion and runny nose, and causes few to no side effects. A number of alternative products, including vitamin C, doubling up on your vitamin D and herbal products such as echinacea, may help. Certain products, such as nasal gels that contain zinc (eg, Zicam), have been associated with a permanent loss of smell.  Antibiotics - Antibiotics should not be used to treat an uncomplicated common cold. As noted above, colds are caused by viruses. Antibiotics treat bacterial, not viral infections. Some viruses that cause the common cold can also depress the immune system or cause swelling in the lining of the nose or airways; this can, in turn, lead to a bacterial infection. Often you need to give your body 7 days to fight off a common cold while  treating the symptoms with the medications listed above. If after 7 days your symptoms are not improving, you are getting worse, you have shortness of breath, chest pain, a fever of over 103 you should seek medical help immediately.   PREVENTION IS THE BEST MEDICINE - Hand washing is an essential and highly effective way to prevent the spread of infection.  Alcohol-based hand rubs are a good alternative for disinfecting hands if a sink is not available.  Hands should be washed before preparing food and eating and after coughing, blowing the nose, or sneezing. While it is not always possible to limit contact with people who may be infected with a cold, touching the eyes, nose, or mouth after direct contact should be avoided when possible. Sneezing/coughing into the sleeve of one's clothing (at the inner elbow) is another means of containing sprays of saliva and  secretions and does not contaminate the hands.

## 2013-06-06 ENCOUNTER — Ambulatory Visit: Payer: Self-pay | Admitting: Physician Assistant

## 2013-06-09 ENCOUNTER — Telehealth: Payer: Self-pay

## 2013-06-09 NOTE — Telephone Encounter (Signed)
Pt's wife called and states pt has had no relief w/ ABX. Pt still has dry cough and sore throat. Pt is advised to come in for OV to recheck. Pt's wife aware.

## 2013-06-13 ENCOUNTER — Ambulatory Visit (INDEPENDENT_AMBULATORY_CARE_PROVIDER_SITE_OTHER): Payer: Medicare Other | Admitting: Internal Medicine

## 2013-06-13 ENCOUNTER — Encounter: Payer: Self-pay | Admitting: Internal Medicine

## 2013-06-13 VITALS — BP 128/70 | HR 60 | Temp 97.7°F | Resp 16 | Ht 71.75 in | Wt 258.6 lb

## 2013-06-13 DIAGNOSIS — I10 Essential (primary) hypertension: Secondary | ICD-10-CM

## 2013-06-13 DIAGNOSIS — R7309 Other abnormal glucose: Secondary | ICD-10-CM

## 2013-06-13 DIAGNOSIS — Z0000 Encounter for general adult medical examination without abnormal findings: Secondary | ICD-10-CM

## 2013-06-13 DIAGNOSIS — Z1331 Encounter for screening for depression: Secondary | ICD-10-CM

## 2013-06-13 DIAGNOSIS — Z1212 Encounter for screening for malignant neoplasm of rectum: Secondary | ICD-10-CM

## 2013-06-13 DIAGNOSIS — Z79899 Other long term (current) drug therapy: Secondary | ICD-10-CM

## 2013-06-13 DIAGNOSIS — Z125 Encounter for screening for malignant neoplasm of prostate: Secondary | ICD-10-CM

## 2013-06-13 DIAGNOSIS — R7303 Prediabetes: Secondary | ICD-10-CM

## 2013-06-13 DIAGNOSIS — E559 Vitamin D deficiency, unspecified: Secondary | ICD-10-CM

## 2013-06-13 DIAGNOSIS — Z789 Other specified health status: Secondary | ICD-10-CM

## 2013-06-13 DIAGNOSIS — E785 Hyperlipidemia, unspecified: Secondary | ICD-10-CM

## 2013-06-13 LAB — CBC WITH DIFFERENTIAL/PLATELET
Basophils Absolute: 0 10*3/uL (ref 0.0–0.1)
Basophils Relative: 0 % (ref 0–1)
Eosinophils Absolute: 0.3 10*3/uL (ref 0.0–0.7)
Eosinophils Relative: 3 % (ref 0–5)
HCT: 45 % (ref 39.0–52.0)
Hemoglobin: 16 g/dL (ref 13.0–17.0)
Lymphocytes Relative: 31 % (ref 12–46)
Lymphs Abs: 2.7 10*3/uL (ref 0.7–4.0)
MCH: 31.6 pg (ref 26.0–34.0)
MCHC: 35.6 g/dL (ref 30.0–36.0)
MCV: 88.8 fL (ref 78.0–100.0)
Monocytes Absolute: 0.9 10*3/uL (ref 0.1–1.0)
Monocytes Relative: 10 % (ref 3–12)
Neutro Abs: 4.9 10*3/uL (ref 1.7–7.7)
Neutrophils Relative %: 56 % (ref 43–77)
Platelets: 320 10*3/uL (ref 150–400)
RBC: 5.07 MIL/uL (ref 4.22–5.81)
RDW: 13 % (ref 11.5–15.5)
WBC: 8.7 10*3/uL (ref 4.0–10.5)

## 2013-06-13 NOTE — Patient Instructions (Signed)

## 2013-06-13 NOTE — Progress Notes (Signed)
Patient ID: [name] [name], male   DOB: [birth year], 73 y.o.   MRN: [id number]   Annual Screening Comprehensive Examination  This very nice 73 y.o.  MWM presents for complete physical.  Patient has been followed for HTN, Diabetes  Prediabetes, Hyperlipidemia, and Vitamin D Deficiency.   HTN predates since  2002. Patient's BP has been controlled at home.Today's BP: 128/70 mmHg. Patient denies any cardiac symptoms as chest pain, palpitations, shortness of breath, dizziness or ankle swelling.   Patient's hyperlipidemia is controlled with diet and medications. Patient denies myalgias or other medication SE's. Last cholesterol last visit was 197, triglycerides  213, HDL 49 and LDL 105 in Jan - near goal.     Patient has Morbid Obesity (BMI 38) and consequent prediabetes with  A1c 6.2% in Mar 2012 and with last A1c 6.4% and elevated insulin 124 in Sept 2014. Patient denies reactive hypoglycemic symptoms, visual blurring, diabetic polys, or paresthesias.    Finally, patient has history of Vitamin D Deficiency of 31 in 2009 with last vitamin D of 70 in Sept 2014.  Medication Sig  . aspirin 81 MG tablet Take 81 mg by mouth daily.    . B Complex Vitamins (VITAMIN-B COMPLEX PO) Take 1 capsule by mouth daily.    . bisoprolol-hydrochlorothiazide (ZIAC) 5-6.25  TAKE 1 TABLET BY MOUTH EVERY MORNING FOR BLOODPRESSURE  . Cholecalciferol (VITAMIN D) 2000 UNITS tablet Take 10,000 Units by mouth daily.  . fenofibrate micronized (LOFIBRA) 134 MG  TAKE ONE CAPSULE BY MOUTH EVERY DAY  . fish oil-omega-3 fatty acids 1000 MG capsule Take 1 g by mouth 2 (two) times daily.   . Flaxseed, Linseed, (FLAXSEED OIL PO) Take 1 capsule by mouth daily.    Marland Kitchen losartan (COZAAR) 100 MG tablet TAKE 1 TABLET BY MOUTH EVERY DAY FOR BLOOD PRESSURE  . Magnesium 250 MG TABS Take 250 mg by mouth daily.  Marland Kitchen NEXIUM 40 MG capsule Take 40 mg by mouth Daily.    Allergies  Allergen Reactions  . Iohexol Shortness Of Breath  . Ace Inhibitors      cough    Past Medical History  Diagnosis Date  . Hyperlipidemia   . Hypertension   . GERD (gastroesophageal reflux disease)   . Kidney stones   . Arthritis   . Obese     Past Surgical History  Procedure Laterality Date  . Tumor ear    . Cystoscopy w/ ureteroscopy w/ lithotripsy    . Inguinal hernia repair      left  . Colonoscopy    . Upper gastrointestinal endoscopy      No family history on file.  History   Social History  . Marital Status: Married    Spouse Name: N/A    Number of Children: N/A  . Years of Education: N/A   Occupational History  . Not on file.   Social History Main Topics  . Smoking status: Former Smoker    Quit date: 03/02/1974  . Smokeless tobacco: Not on file  . Alcohol Use: 4.2 oz/week    7 Glasses of wine per week  . Drug Use: No  . Sexual Activity: Not on file   Other Topics Concern  . Not on file   Social History Narrative  . No narrative on file    ROS Constitutional: Denies fever, chills, weight loss/gain, headaches, insomnia, fatigue, night sweats, and change in appetite. Eyes: Denies redness, blurred vision, diplopia, discharge, itchy, watery eyes.  ENT: Denies discharge, congestion, post  nasal drip, epistaxis, sore throat, earache, hearing loss, dental pain, Tinnitus, Vertigo, Sinus pain, snoring.  Cardio: Denies chest pain, palpitations, irregular heartbeat, syncope, dyspnea, diaphoresis, orthopnea, PND, claudication, edema Respiratory: denies cough, dyspnea, DOE, pleurisy, hoarseness, laryngitis, wheezing.  Gastrointestinal: Denies dysphagia, heartburn, reflux, water brash, pain, cramps, nausea, vomiting, bloating, diarrhea, constipation, hematemesis, melena, hematochezia, jaundice, hemorrhoids Genitourinary: Denies dysuria, frequency, urgency, nocturia, hesitancy, discharge, hematuria, flank pain Musculoskeletal: Denies arthralgia, myalgia, stiffness, Jt. Swelling, pain, limp, and strain/sprain. Skin: Denies puritis,  rash, hives, warts, acne, eczema, changing in skin lesion Neuro: No weakness, tremor, incoordination, spasms, paresthesia, pain Psychiatric: Denies confusion, memory loss, sensory loss Endocrine: Denies change in weight, skin, hair change, nocturia, and paresthesia, diabetic polys, visual blurring, hyper / hypo glycemic episodes.  Heme/Lymph: No excessive bleeding, bruising, or elarged lymph nodes.  Physical Exam  BP 128/70  Pulse 60  Temp 97.7 F   Resp 16  Ht 5' 11.75"  Wt 258 lb 9.6 oz   BMI 35.33 kg/m2  General Appearance: Well nourished, in no apparent distress. Eyes: PERRLA, EOMs, conjunctiva no swelling or erythema, normal fundi and vessels. Sinuses: No frontal/maxillary tenderness ENT/Mouth: EACs patent / TMs  nl. Nares clear without erythema, swelling, mucoid exudates. Oral hygiene is good. No erythema, swelling, or exudate. Tongue normal, non-obstructing. Tonsils not swollen or erythematous. Hearing normal.  Neck: Supple, thyroid normal. No bruits, nodes or JVD. Respiratory: Respiratory effort normal.  BS equal and clear bilateral without rales, rhonci, wheezing or stridor. Cardio: Heart sounds are normal with regular rate and rhythm and no murmurs, rubs or gallops. Peripheral pulses are normal and equal bilaterally without edema. No aortic or femoral bruits. Chest: symmetric with normal excursions and percussion.  Abdomen: Flat, soft, with bowl sounds. Nontender, no guarding, rebound, hernias, masses, or organomegaly.  Lymphatics: Non tender without lymphadenopathy.  Genitourinary: No hernias.Testes nl. DRE - prostate nl for age - smooth & firm w/o nodules. Musculoskeletal: Full ROM all peripheral extremities, joint stability, 5/5 strength, and normal gait. Skin: Warm and dry without rashes, lesions, cyanosis, clubbing or  ecchymosis.  Neuro: Cranial nerves intact, reflexes equal bilaterally. Normal muscle tone, no cerebellar symptoms. Sensation intact.  Pysch: Awake and  oriented X 3, normal affect, insight and judgment appropriate.   Assessment and Plan  1. Annual Screening Examination 2. Hypertension  3. Hyperlipidemia 4. Pre Diabetes 5. Vitamin D Deficiency 6. Morbid Obesity (BMI 38)  Continue prudent diet as discussed, weight control, BP monitoring, regular exercise, and medications as discussed.  Discussed med effects and SE's. Routine screening labs and tests as requested with regular follow-up as recommended.

## 2013-06-14 LAB — LIPID PANEL
Cholesterol: 179 mg/dL (ref 0–200)
HDL: 47 mg/dL (ref >39)
LDL Cholesterol: 106 mg/dL — ABNORMAL HIGH (ref 0–99)
Total CHOL/HDL Ratio: 3.8 Ratio
Triglycerides: 129 mg/dL (ref <150)
VLDL: 26 mg/dL (ref 0–40)

## 2013-06-14 LAB — HEMOGLOBIN A1C
Hgb A1c MFr Bld: 6 % — ABNORMAL HIGH (ref <5.7)
Mean Plasma Glucose: 126 mg/dL — ABNORMAL HIGH (ref <117)

## 2013-06-14 LAB — URINALYSIS, MICROSCOPIC ONLY
Bacteria, UA: NONE SEEN
Casts: NONE SEEN
Crystals: NONE SEEN
Squamous Epithelial / LPF: NONE SEEN

## 2013-06-14 LAB — HEPATIC FUNCTION PANEL
ALT: 28 U/L (ref 0–53)
AST: 26 U/L (ref 0–37)
Albumin: 4 g/dL (ref 3.5–5.2)
Alkaline Phosphatase: 39 U/L (ref 39–117)
Bilirubin, Direct: 0.2 mg/dL (ref 0.0–0.3)
Indirect Bilirubin: 0.5 mg/dL (ref 0.2–1.2)
Total Bilirubin: 0.7 mg/dL (ref 0.2–1.2)
Total Protein: 6.6 g/dL (ref 6.0–8.3)

## 2013-06-14 LAB — MICROALBUMIN / CREATININE URINE RATIO
Creatinine, Urine: 76.4 mg/dL
Microalb Creat Ratio: 32.9 mg/g — ABNORMAL HIGH (ref 0.0–30.0)
Microalb, Ur: 2.51 mg/dL — ABNORMAL HIGH (ref 0.00–1.89)

## 2013-06-14 LAB — MAGNESIUM: Magnesium: 1.7 mg/dL (ref 1.5–2.5)

## 2013-06-14 LAB — BASIC METABOLIC PANEL WITH GFR
BUN: 21 mg/dL (ref 6–23)
CO2: 28 mEq/L (ref 19–32)
Calcium: 10.8 mg/dL — ABNORMAL HIGH (ref 8.4–10.5)
Chloride: 102 mEq/L (ref 96–112)
Creat: 1.34 mg/dL (ref 0.50–1.35)
GFR, Est African American: 62 mL/min
GFR, Est Non African American: 53 mL/min — ABNORMAL LOW
Glucose, Bld: 83 mg/dL (ref 70–99)
Potassium: 4.6 mEq/L (ref 3.5–5.3)
Sodium: 137 mEq/L (ref 135–145)

## 2013-06-14 LAB — INSULIN, FASTING: Insulin fasting, serum: 14 u[IU]/mL (ref 3–28)

## 2013-06-14 LAB — VITAMIN D 25 HYDROXY (VIT D DEFICIENCY, FRACTURES): Vit D, 25-Hydroxy: 70 ng/mL (ref 30–89)

## 2013-06-14 LAB — TSH: TSH: 2.216 u[IU]/mL (ref 0.350–4.500)

## 2013-06-15 ENCOUNTER — Other Ambulatory Visit: Payer: Self-pay | Admitting: *Deleted

## 2013-06-15 ENCOUNTER — Telehealth: Payer: Self-pay | Admitting: *Deleted

## 2013-06-15 MED ORDER — BENZONATATE 100 MG PO CAPS: ORAL_CAPSULE | ORAL | Status: DC

## 2013-06-15 NOTE — Telephone Encounter (Signed)
Patient called. States cough no better on antibiotic and prednisone.  RX Tessalon Perles called to [facility] per Dr Melford Aase.  Left message to inform patient.

## 2013-07-05 ENCOUNTER — Other Ambulatory Visit (INDEPENDENT_AMBULATORY_CARE_PROVIDER_SITE_OTHER): Payer: Medicare Other | Admitting: *Deleted

## 2013-07-05 DIAGNOSIS — Z1212 Encounter for screening for malignant neoplasm of rectum: Secondary | ICD-10-CM

## 2013-07-05 LAB — POC HEMOCCULT BLD/STL (HOME/3-CARD/SCREEN)
Card #2 Fecal Occult Blod, POC: NEGATIVE
Card #3 Fecal Occult Blood, POC: NEGATIVE
Fecal Occult Blood, POC: NEGATIVE

## 2013-08-09 ENCOUNTER — Other Ambulatory Visit: Payer: Self-pay | Admitting: *Deleted

## 2013-08-09 MED ORDER — OMEPRAZOLE 40 MG PO CPDR: 40.0000 mg | DELAYED_RELEASE_CAPSULE | Freq: Every day | ORAL | Status: DC

## 2013-08-12 ENCOUNTER — Other Ambulatory Visit: Payer: Self-pay | Admitting: Physician Assistant

## 2013-08-14 ENCOUNTER — Other Ambulatory Visit: Payer: Self-pay | Admitting: *Deleted

## 2013-08-14 MED ORDER — MELOXICAM 15 MG PO TABS: 15.0000 mg | ORAL_TABLET | Freq: Every day | ORAL | Status: DC

## 2013-08-21 ENCOUNTER — Other Ambulatory Visit: Payer: Self-pay | Admitting: *Deleted

## 2013-08-21 MED ORDER — BISOPROLOL-HYDROCHLOROTHIAZIDE 5-6.25 MG PO TABS: ORAL_TABLET | ORAL | Status: DC

## 2013-09-04 ENCOUNTER — Other Ambulatory Visit: Payer: Self-pay | Admitting: Internal Medicine

## 2013-09-12 ENCOUNTER — Ambulatory Visit (INDEPENDENT_AMBULATORY_CARE_PROVIDER_SITE_OTHER): Payer: Medicare Other | Admitting: Physician Assistant

## 2013-09-12 ENCOUNTER — Encounter: Payer: Self-pay | Admitting: Physician Assistant

## 2013-09-12 VITALS — BP 110/78 | HR 60 | Temp 97.7°F | Resp 16 | Wt 245.0 lb

## 2013-09-12 DIAGNOSIS — E669 Obesity, unspecified: Secondary | ICD-10-CM

## 2013-09-12 DIAGNOSIS — E559 Vitamin D deficiency, unspecified: Secondary | ICD-10-CM

## 2013-09-12 DIAGNOSIS — Z79899 Other long term (current) drug therapy: Secondary | ICD-10-CM

## 2013-09-12 DIAGNOSIS — I10 Essential (primary) hypertension: Secondary | ICD-10-CM

## 2013-09-12 DIAGNOSIS — R7303 Prediabetes: Secondary | ICD-10-CM

## 2013-09-12 DIAGNOSIS — E785 Hyperlipidemia, unspecified: Secondary | ICD-10-CM

## 2013-09-12 DIAGNOSIS — R7309 Other abnormal glucose: Secondary | ICD-10-CM

## 2013-09-12 LAB — CBC WITH DIFFERENTIAL/PLATELET
Basophils Absolute: 0 10*3/uL (ref 0.0–0.1)
Basophils Relative: 0 % (ref 0–1)
Eosinophils Absolute: 0.2 10*3/uL (ref 0.0–0.7)
Eosinophils Relative: 3 % (ref 0–5)
HCT: 44.9 % (ref 39.0–52.0)
Hemoglobin: 15.8 g/dL (ref 13.0–17.0)
Lymphocytes Relative: 26 % (ref 12–46)
Lymphs Abs: 1.7 10*3/uL (ref 0.7–4.0)
MCH: 32 pg (ref 26.0–34.0)
MCHC: 35.2 g/dL (ref 30.0–36.0)
MCV: 90.9 fL (ref 78.0–100.0)
Monocytes Absolute: 0.8 10*3/uL (ref 0.1–1.0)
Monocytes Relative: 12 % (ref 3–12)
Neutro Abs: 4 10*3/uL (ref 1.7–7.7)
Neutrophils Relative %: 59 % (ref 43–77)
Platelets: 255 10*3/uL (ref 150–400)
RBC: 4.94 MIL/uL (ref 4.22–5.81)
RDW: 13 % (ref 11.5–15.5)
WBC: 6.7 10*3/uL (ref 4.0–10.5)

## 2013-09-12 LAB — HEMOGLOBIN A1C
Hgb A1c MFr Bld: 5.6 % (ref <5.7)
Mean Plasma Glucose: 114 mg/dL (ref <117)

## 2013-09-12 NOTE — Progress Notes (Signed)
Assessment and Plan:  Hypertension: Continue medication, monitor blood pressure at home. Continue DASH diet. Cholesterol: Continue diet and exercise. Check cholesterol.  Pre-diabetes-Continue diet and exercise. Check A1C Vitamin D Def- check level and continue medications.  Obesity with co morbidities- long discussion about weight loss, diet, and exercise   Continue diet and meds as discussed. Further disposition pending results of labs.  HPI 73 y.o. male  presents for 3 month follow up with hypertension, hyperlipidemia, prediabetes and vitamin D. His blood pressure has been controlled at home, today their BP is BP: 110/78 mmHg He does workout. He denies chest pain, shortness of breath, dizziness.  He is on cholesterol medication, fenofibrate and denies myalgias. His cholesterol is at goal. The cholesterol last visit was:   Lab Results  Component Value Date   CHOL 179 06/13/2013   HDL 47 06/13/2013   LDLCALC 106* 06/13/2013   TRIG 129 06/13/2013   CHOLHDL 3.8 06/13/2013   He has been working on diet and exercise for prediabetes, and denies nausea, paresthesia of the feet, polydipsia and polyuria. Last A1C in the office was:  Lab Results  Component Value Date   HGBA1C 6.0* 06/13/2013   Patient is on Vitamin D supplement.   Lab Results  Component Value Date   VD25OH 70 06/13/2013     He BMI is Body mass index is 33.48 kg/(m^2)., she is working on diet and exercise and has done well.  Wt Readings from Last 3 Encounters:  09/12/13 245 lb (111.131 kg)  06/13/13 258 lb 9.6 oz (117.3 kg)  06/02/13 260 lb (117.935 kg)    Current Medications:  Current Outpatient Prescriptions on File Prior to Visit  Medication Sig Dispense Refill  . aspirin 81 MG tablet Take 81 mg by mouth daily.        . B Complex Vitamins (VITAMIN-B COMPLEX PO) Take 1 capsule by mouth daily.        . benzonatate (TESSALON PERLES) 100 MG capsule 1-2 perles TID to prevent cough  90 capsule  0  .  bisoprolol-hydrochlorothiazide (ZIAC) 5-6.25 MG per tablet TAKE 1 TABLET BY MOUTH EVERY MORNING FOR BLOOD PRESSURE  90 tablet  3  . Cholecalciferol (VITAMIN D) 2000 UNITS tablet Take 10,000 Units by mouth daily.      . fenofibrate micronized (LOFIBRA) 134 MG capsule TAKE ONE CAPSULE BY MOUTH EVERY DAY  30 capsule  6  . fish oil-omega-3 fatty acids 1000 MG capsule Take 1 g by mouth 2 (two) times daily.       . Flaxseed, Linseed, (FLAXSEED OIL PO) Take 1 capsule by mouth daily.        Marland Kitchen losartan (COZAAR) 100 MG tablet TAKE 1 TABLET BY MOUTH EVERY DAY FOR BLOOD PRESSURE  90 tablet  2  . Magnesium 250 MG TABS Take 250 mg by mouth daily.      . meloxicam (MOBIC) 15 MG tablet Take 1 tablet (15 mg total) by mouth daily.  90 tablet  0  . omeprazole (PRILOSEC) 40 MG capsule Take 1 capsule (40 mg total) by mouth daily.  90 capsule  3   No current facility-administered medications on file prior to visit.   Medical History:  Past Medical History  Diagnosis Date  . Hyperlipidemia   . Hypertension   . GERD (gastroesophageal reflux disease)   . Kidney stones   . Arthritis   . Obese    Allergies:  Allergies  Allergen Reactions  . Iohexol Shortness Of Breath  .  Ace Inhibitors     cough     Review of Systems: [X]  = complains of  [ ]  = denies  General: Fatigue [ ]  Fever [ ]  Chills [ ]  Weakness [ ]   Insomnia [ ]  Eyes: Redness [ ]  Blurred vision [ ]  Diplopia [ ]   ENT: Congestion [ ]  Sinus Pain [ ]  Post Nasal Drip [ ]  Sore Throat [ ]  Earache [ ]   Cardiac: Chest pain/pressure [ ]  SOB [ ]  Orthopnea [ ]   Palpitations [ ]   Paroxysmal nocturnal dyspnea[ ]  Claudication [ ]  Edema [ ]   Pulmonary: Cough [ ]  Wheezing[ ]   SOB [ ]   Snoring [ ]   GI: Nausea [ ]  Vomiting[ ]  Dysphagia[ ]  Heartburn[ ]  Abdominal pain [ ]  Constipation [ ] ; Diarrhea [ ] ; BRBPR [ ]  Melena[ ]  GU: Hematuria[ ]  Dysuria [ ]  Nocturia[ ]  Urgency [ ]   Hesitancy [ ]  Discharge [ ]  Neuro: Headaches[ ]  Vertigo[ ]  Paresthesias[ ]  Spasm [ ]  Speech  changes [ ]  Incoordination [ ]   Ortho: Arthritis [ ]  Joint pain [ ]  Muscle pain [ ]  Joint swelling [ ]  Back Pain [ ]  Skin:  Rash [ ]   Pruritis [ ]  Change in skin lesion [ ]   Psych: Depression[ ]  Anxiety[ ]  Confusion [ ]  Memory loss [ ]   Heme/Lypmh: Bleeding [ ]  Bruising [ ]  Enlarged lymph nodes [ ]   Endocrine: Visual blurring [ ]  Paresthesia [ ]  Polyuria [ ]  Polydypsea [ ]    Heat/cold intolerance [ ]  Hypoglycemia [ ]   Family history- Review and unchanged Social history- Review and unchanged Physical Exam: BP 110/78  Pulse 60  Temp(Src) 97.7 F (36.5 C)  Resp 16  Wt 245 lb (111.131 kg) Wt Readings from Last 3 Encounters:  09/12/13 245 lb (111.131 kg)  06/13/13 258 lb 9.6 oz (117.3 kg)  06/02/13 260 lb (117.935 kg)  Body mass index is 33.48 kg/(m^2).  General Appearance: Well nourished, in no apparent distress. Eyes: PERRLA, EOMs, conjunctiva no swelling or erythema Sinuses: No Frontal/maxillary tenderness ENT/Mouth: Ext aud canals clear, TMs without erythema, bulging. No erythema, swelling, or exudate on post pharynx.  Tonsils not swollen or erythematous. Hearing normal.  Neck: Supple, thyroid normal.  Respiratory: Respiratory effort normal, BS equal bilaterally without rales, rhonchi, wheezing or stridor.  Cardio: RRR with no MRGs. Brisk peripheral pulses without edema.  Abdomen: Soft, + BS.  Non tender, no guarding, rebound, hernias, masses. Lymphatics: Non tender without lymphadenopathy.  Musculoskeletal: Full ROM, 5/5 strength, normal gait.  Skin: Warm, dry without rashes, lesions, ecchymosis.  Neuro: Cranial nerves intact. Normal muscle tone, no cerebellar symptoms. Sensation intact.  Psych: Awake and oriented X 3, normal affect, Insight and Judgment appropriate.    Vicie Mutters 1:39 PM

## 2013-09-12 NOTE — Patient Instructions (Addendum)
Increase water, 64 oz a day Try chobani 100, oikos triple zero, or lite and fit greek yogurt.  Try to add more veggies Bubba cup look it up  We want weight loss that will last so you should lose 1-2 pounds a week.  THAT IS IT! Please pick THREE things a month to change. Once it is a habit check off the item. Then pick another three items off the list to become habits.  If you are already doing a habit on the list GREAT!  Cross that item off! o Don't drink your calories. Ie, alcohol, soda, fruit juice, and sweet tea.  o Drink more water. Drink a glass when you feel hungry or before each meal.  o Eat breakfast - Complex carb and protein (likeDannon light and fit yogurt, oatmeal, fruit, eggs, [place] bacon). o Measure your cereal.  Eat no more than one cup a day. (ie [place]) o Eat an apple a day. o Add a vegetable a day. o Try a new vegetable a month. o Use Pam! Stop using oil or butter to cook. o Don't finish your plate or use smaller plates. o Share your dessert. o Eat sugar free Jello for dessert or frozen grapes. o Don't eat 2-3 hours before bed. o Switch to whole wheat bread, pasta, and brown rice. o Make healthier choices when you eat out. No fries! o Pick baked chicken, NOT fried. o Don't forget to SLOW DOWN when you eat. It is not going anywhere.  o Take the stairs. o Park far away in the parking lot o [employer] (or weights) for 10 minutes while watching TV. o Walk at work for 10 minutes during break. o Walk outside 1 time a week with your friend, kids, dog, or significant other. o Start a walking group at [place] the mall as much as you can tolerate.  o Keep a food diary. o Weigh yourself daily. o Walk for 15 minutes 3 days per week. o Cook at home more often and eat out less.  If life happens and you go back to old habits, it is okay.  Just start over. You can do it!   If you experience chest pain, get short of breath, or tired during the exercise, please stop  immediately and inform your doctor.

## 2013-09-13 LAB — HEPATIC FUNCTION PANEL
ALT: 25 U/L (ref 0–53)
AST: 26 U/L (ref 0–37)
Albumin: 4.6 g/dL (ref 3.5–5.2)
Alkaline Phosphatase: 42 U/L (ref 39–117)
Bilirubin, Direct: 0.2 mg/dL (ref 0.0–0.3)
Indirect Bilirubin: 0.7 mg/dL (ref 0.2–1.2)
Total Bilirubin: 0.9 mg/dL (ref 0.2–1.2)
Total Protein: 6.8 g/dL (ref 6.0–8.3)

## 2013-09-13 LAB — MAGNESIUM: Magnesium: 1.8 mg/dL (ref 1.5–2.5)

## 2013-09-13 LAB — BASIC METABOLIC PANEL WITH GFR
BUN: 21 mg/dL (ref 6–23)
CO2: 24 mEq/L (ref 19–32)
Calcium: 11.2 mg/dL — ABNORMAL HIGH (ref 8.4–10.5)
Chloride: 102 mEq/L (ref 96–112)
Creat: 1.3 mg/dL (ref 0.50–1.35)
GFR, Est African American: 64 mL/min
GFR, Est Non African American: 55 mL/min — ABNORMAL LOW
Glucose, Bld: 101 mg/dL — ABNORMAL HIGH (ref 70–99)
Potassium: 3.9 mEq/L (ref 3.5–5.3)
Sodium: 136 mEq/L (ref 135–145)

## 2013-09-13 LAB — INSULIN, FASTING: Insulin fasting, serum: 47 u[IU]/mL — ABNORMAL HIGH (ref 3–28)

## 2013-09-13 LAB — LIPID PANEL
Cholesterol: 173 mg/dL (ref 0–200)
HDL: 49 mg/dL (ref >39)
LDL Cholesterol: 90 mg/dL (ref 0–99)
Total CHOL/HDL Ratio: 3.5 Ratio
Triglycerides: 170 mg/dL — ABNORMAL HIGH (ref <150)
VLDL: 34 mg/dL (ref 0–40)

## 2013-09-13 LAB — VITAMIN D 25 HYDROXY (VIT D DEFICIENCY, FRACTURES): Vit D, 25-Hydroxy: 88 ng/mL (ref 30–89)

## 2013-09-13 LAB — TSH: TSH: 1.676 u[IU]/mL (ref 0.350–4.500)

## 2013-10-30 ENCOUNTER — Other Ambulatory Visit: Payer: Self-pay | Admitting: Emergency Medicine

## 2013-12-01 ENCOUNTER — Other Ambulatory Visit: Payer: Self-pay | Admitting: *Deleted

## 2013-12-01 MED ORDER — MELOXICAM 15 MG PO TABS: 15.0000 mg | ORAL_TABLET | Freq: Every day | ORAL | Status: DC

## 2013-12-14 ENCOUNTER — Ambulatory Visit: Payer: Self-pay | Admitting: Internal Medicine

## 2013-12-19 ENCOUNTER — Telehealth: Payer: Self-pay | Admitting: *Deleted

## 2013-12-19 MED ORDER — AZITHROMYCIN 250 MG PO TABS: ORAL_TABLET | ORAL | Status: DC

## 2013-12-19 NOTE — Telephone Encounter (Signed)
Spouse called and states patient is in [place] and has a cough and sore throat.  OK to send in a Z-pak per Dr Melford Aase. Z-pak  #1 called to [facility] in [place] [phone].

## 2014-01-01 ENCOUNTER — Other Ambulatory Visit: Payer: Self-pay | Admitting: Urology

## 2014-01-01 DIAGNOSIS — N200 Calculus of kidney: Secondary | ICD-10-CM

## 2014-01-15 ENCOUNTER — Other Ambulatory Visit: Payer: Self-pay

## 2014-01-15 MED ORDER — FENOFIBRATE MICRONIZED 134 MG PO CAPS: ORAL_CAPSULE | ORAL | Status: DC

## 2014-01-16 ENCOUNTER — Other Ambulatory Visit: Payer: Self-pay | Admitting: *Deleted

## 2014-01-16 MED ORDER — FENOFIBRATE MICRONIZED 134 MG PO CAPS: ORAL_CAPSULE | ORAL | Status: DC

## 2014-01-19 ENCOUNTER — Encounter (HOSPITAL_COMMUNITY)
Admission: RE | Admit: 2014-01-19 | Discharge: 2014-01-19 | Disposition: A | Discharge status: Home / Self Care | Payer: Medicare Other | Source: Ambulatory Visit | Attending: Urology | Admitting: Urology

## 2014-01-19 ENCOUNTER — Ambulatory Visit (HOSPITAL_COMMUNITY)
Admission: RE | Admit: 2014-01-19 | Discharge: 2014-01-19 | Disposition: A | Discharge status: Home / Self Care | Payer: Medicare Other | Source: Ambulatory Visit | Attending: Anesthesiology | Admitting: Anesthesiology

## 2014-01-19 ENCOUNTER — Encounter (HOSPITAL_COMMUNITY): Payer: Self-pay

## 2014-01-19 DIAGNOSIS — Z7952 Long term (current) use of systemic steroids: Secondary | ICD-10-CM | POA: Diagnosis not present

## 2014-01-19 DIAGNOSIS — Z87891 Personal history of nicotine dependence: Secondary | ICD-10-CM | POA: Diagnosis not present

## 2014-01-19 DIAGNOSIS — N200 Calculus of kidney: Secondary | ICD-10-CM | POA: Insufficient documentation

## 2014-01-19 DIAGNOSIS — Z01818 Encounter for other preprocedural examination: Secondary | ICD-10-CM | POA: Insufficient documentation

## 2014-01-19 DIAGNOSIS — M19071 Primary osteoarthritis, right ankle and foot: Secondary | ICD-10-CM | POA: Diagnosis not present

## 2014-01-19 DIAGNOSIS — K219 Gastro-esophageal reflux disease without esophagitis: Secondary | ICD-10-CM | POA: Diagnosis not present

## 2014-01-19 DIAGNOSIS — E669 Obesity, unspecified: Secondary | ICD-10-CM | POA: Diagnosis not present

## 2014-01-19 DIAGNOSIS — Z794 Long term (current) use of insulin: Secondary | ICD-10-CM | POA: Diagnosis not present

## 2014-01-19 DIAGNOSIS — Z888 Allergy status to other drugs, medicaments and biological substances status: Secondary | ICD-10-CM | POA: Diagnosis not present

## 2014-01-19 DIAGNOSIS — J449 Chronic obstructive pulmonary disease, unspecified: Secondary | ICD-10-CM | POA: Insufficient documentation

## 2014-01-19 DIAGNOSIS — I10 Essential (primary) hypertension: Secondary | ICD-10-CM | POA: Diagnosis not present

## 2014-01-19 DIAGNOSIS — Z87442 Personal history of urinary calculi: Secondary | ICD-10-CM | POA: Diagnosis not present

## 2014-01-19 DIAGNOSIS — E785 Hyperlipidemia, unspecified: Secondary | ICD-10-CM | POA: Diagnosis not present

## 2014-01-19 DIAGNOSIS — Z7982 Long term (current) use of aspirin: Secondary | ICD-10-CM | POA: Diagnosis not present

## 2014-01-19 HISTORY — DX: Personal history of urinary calculi: Z87.442

## 2014-01-19 LAB — BASIC METABOLIC PANEL
Anion gap: 11 (ref 5–15)
BUN: 21 mg/dL (ref 6–23)
CO2: 25 mEq/L (ref 19–32)
Calcium: 11.2 mg/dL — ABNORMAL HIGH (ref 8.4–10.5)
Chloride: 99 mEq/L (ref 96–112)
Creatinine, Ser: 1.22 mg/dL (ref 0.50–1.35)
GFR calc Af Amer: 68 mL/min — ABNORMAL LOW (ref >90)
GFR calc non Af Amer: 58 mL/min — ABNORMAL LOW (ref >90)
Glucose, Bld: 100 mg/dL — ABNORMAL HIGH (ref 70–99)
Potassium: 4.5 mEq/L (ref 3.7–5.3)
Sodium: 135 mEq/L — ABNORMAL LOW (ref 137–147)

## 2014-01-19 LAB — CBC
HCT: 45.9 % (ref 39.0–52.0)
Hemoglobin: 15.9 g/dL (ref 13.0–17.0)
MCH: 31.7 pg (ref 26.0–34.0)
MCHC: 34.6 g/dL (ref 30.0–36.0)
MCV: 91.4 fL (ref 78.0–100.0)
Platelets: 235 10*3/uL (ref 150–400)
RBC: 5.02 MIL/uL (ref 4.22–5.81)
RDW: 12.7 % (ref 11.5–15.5)
WBC: 7.1 10*3/uL (ref 4.0–10.5)

## 2014-01-19 NOTE — Pre-Procedure Instructions (Addendum)
01-19-14 EKG 4'15 Epic. CXR done today. 01-19-14 0830 AM call to "Tiffany" in IVR , states radiologist orders will be entered 2 days prior to procedure, Short Stay will do AM of.

## 2014-01-19 NOTE — Progress Notes (Signed)
Your Pt has screened with an elevated risk for obstructive sleep apnea using the Stop-Bang tool during a presurgical  Visit. A score of four or greater is an elevated risk. 

## 2014-01-19 NOTE — Patient Instructions (Addendum)
20 [name] [name]  01/19/2014   Your procedure is scheduled on:   02-01-2014 Thursday.  Enter through [facility] Entrance and follow signs to Radiology. Arrive at    0830    AM. Then you will be taken to [facility] for preparation of Interventional Radiology procedure..  Call this number if you have problems the morning of surgery: [phone]  Or Presurgical Testing [phone].   For Living Will and/or Health Care Power Attorney Forms: please provide copy for your medical record,may bring AM of surgery(Forms should be already notarized -we do not provide this service).(01-19-14 Yes/ will bring copy AM of procedure day ).  Remember: Follow any bowel prep instructions per MD office.    Do not eat food/ or drink: After Midnight.     Take these medicines the morning of surgery with A SIP OF WATER: Omeprazole. Fenofibrate. On arrival to Short Stay(they will give Bisoprolol 5mg  orally-).   Do not wear jewelry, make-up or nail polish.  Do not wear deodorant, lotions, powders, or perfumes.   Do not shave legs and under arms- 48 hours(2 days) prior to first CHG shower.(Shaving face and neck okay.)  Do not bring valuables to the hospital.(Hospital is not responsible for lost valuables).  Contacts, dentures or removable bridgework, body piercing, hair pins may not be worn into surgery.  Leave suitcase in the car. After surgery it may be brought to your room.  For patients admitted to the hospital, checkout time is 11:00 AM the day of discharge.(Restricted visitors-Any Persons displaying flu-like symptoms or illness).    Patients discharged the day of surgery will not be allowed to drive home. Must have responsible person with you x 24 hours once discharged.  Name and phone number of your driver:      Please read over the following fact sheets that you were given:  CHG(Chlorhexidine Gluconate 4% Surgical Soap) use. Remember : Type/Screen "Blue armbands" - may not be removed once  applied(would result in being retested AM of surgery, if removed).         [facility] - Preparing for Surgery Before surgery, you can play an important role.  Because skin is not sterile, your skin needs to be as free of germs as possible.  You can reduce the number of germs on your skin by washing with CHG (chlorahexidine gluconate) soap before surgery.  CHG is an antiseptic cleaner which kills germs and bonds with the skin to continue killing germs even after washing. Please DO NOT use if you have an allergy to CHG or antibacterial soaps.  If your skin becomes reddened/irritated stop using the CHG and inform your nurse when you arrive at Short Stay. Do not shave (including legs and underarms) for at least 48 hours prior to the first CHG shower.  You may shave your face/neck. Please follow these instructions carefully:  1.  Shower with CHG Soap the night before surgery and the  morning of Surgery.  2.  If you choose to wash your hair, wash your hair first as usual with your  normal  shampoo.  3.  After you shampoo, rinse your hair and body thoroughly to remove the  shampoo.                           4.  Use CHG as you would any other liquid soap.  You can apply chg directly  to the skin and wash  Gently with a scrungie or clean washcloth.  5.  Apply the CHG Soap to your body ONLY FROM THE NECK DOWN.   Do not use on face/ open                           Wound or open sores. Avoid contact with eyes, ears mouth and genitals (private parts).                       Wash face,  Genitals (private parts) with your normal soap.             6.  Wash thoroughly, paying special attention to the area where your surgery  will be performed.  7.  Thoroughly rinse your body with warm water from the neck down.  8.  DO NOT shower/wash with your normal soap after using and rinsing off  the CHG Soap.                9.  Pat yourself dry with a clean towel.            10.  Wear clean pajamas.             11.  Place clean sheets on your bed the night of your first shower and do not  sleep with pets. Day of Surgery : Do not apply any lotions/deodorants the morning of surgery.  Please wear clean clothes to the hospital/surgery center.  FAILURE TO FOLLOW THESE INSTRUCTIONS MAY RESULT IN THE CANCELLATION OF YOUR SURGERY PATIENT SIGNATURE_________________________________  NURSE SIGNATURE__________________________________  ________________________________________________________________________

## 2014-01-19 NOTE — Pre-Procedure Instructions (Signed)
01-19-14 Note to PCP, Stop/Bang score 5 with PAT visit today.

## 2014-01-31 ENCOUNTER — Ambulatory Visit (INDEPENDENT_AMBULATORY_CARE_PROVIDER_SITE_OTHER): Payer: Medicare Other | Admitting: *Deleted

## 2014-01-31 ENCOUNTER — Other Ambulatory Visit: Payer: Self-pay | Admitting: Radiology

## 2014-01-31 DIAGNOSIS — Z23 Encounter for immunization: Secondary | ICD-10-CM

## 2014-01-31 NOTE — Anesthesia Preprocedure Evaluation (Addendum)
Anesthesia Evaluation  Patient identified by MRN, date of birth, ID band Patient awake    Reviewed: Allergy & Precautions, H&P , NPO status , Patient's Chart, lab work & pertinent test results  History of Anesthesia Complications Negative for: history of anesthetic complications  Airway Mallampati: II  TM Distance: >3 FB Neck ROM: Full    Dental no notable dental hx. (+) Dental Advisory Given, Caps   Pulmonary asthma (reports asthma as a child) , former smoker,  breath sounds clear to auscultation  Pulmonary exam normal       Cardiovascular Exercise Tolerance: Good hypertension, Pt. on medications and Pt. on home beta blockers Rhythm:Regular Rate:Normal  Reports that he walks 4 miles a day without symptoms    Neuro/Psych negative neurological ROS  negative psych ROS   GI/Hepatic negative GI ROS, Neg liver ROS, GERD-  Medicated and Controlled,  Endo/Other  negative endocrine ROS  Renal/GU Renal disease  negative genitourinary   Musculoskeletal  (+) Arthritis -, Osteoarthritis,    Abdominal   Peds negative pediatric ROS (+)  Hematology negative hematology ROS (+)   Anesthesia Other Findings   Reproductive/Obstetrics negative OB ROS                            Anesthesia Physical Anesthesia Plan  ASA: II  Anesthesia Plan: General   Post-op Pain Management:    Induction: Intravenous  Airway Management Planned: Oral ETT  Additional Equipment:   Intra-op Plan:   Post-operative Plan: Extubation in OR  Informed Consent: I have reviewed the patients History and Physical, chart, labs and discussed the procedure including the risks, benefits and alternatives for the proposed anesthesia with the patient or authorized representative who has indicated his/her understanding and acceptance.   Dental advisory given  Plan Discussed with: CRNA  Anesthesia Plan Comments:          Anesthesia Quick Evaluation

## 2014-02-01 ENCOUNTER — Ambulatory Visit (HOSPITAL_COMMUNITY): Payer: Medicare Other | Admitting: Anesthesiology

## 2014-02-01 ENCOUNTER — Encounter (HOSPITAL_COMMUNITY): Payer: Self-pay | Admitting: *Deleted

## 2014-02-01 ENCOUNTER — Encounter (HOSPITAL_COMMUNITY): Payer: Self-pay

## 2014-02-01 ENCOUNTER — Ambulatory Visit (HOSPITAL_COMMUNITY): Payer: Medicare Other

## 2014-02-01 ENCOUNTER — Encounter (HOSPITAL_COMMUNITY)
Admission: RE | Disposition: A | Discharge status: Home / Self Care | Payer: Self-pay | Source: Ambulatory Visit | Attending: Urology

## 2014-02-01 ENCOUNTER — Observation Stay (HOSPITAL_COMMUNITY)
Admission: RE | Admit: 2014-02-01 | Discharge: 2014-02-02 | Disposition: A | Discharge status: Home / Self Care | Payer: Medicare Other | Source: Ambulatory Visit | Attending: Urology | Admitting: Urology

## 2014-02-01 ENCOUNTER — Ambulatory Visit (HOSPITAL_COMMUNITY)
Admission: RE | Admit: 2014-02-01 | Discharge: 2014-02-01 | Disposition: A | Discharge status: Home / Self Care | Payer: Medicare Other | Source: Ambulatory Visit | Attending: Urology | Admitting: Urology

## 2014-02-01 DIAGNOSIS — E785 Hyperlipidemia, unspecified: Secondary | ICD-10-CM | POA: Diagnosis not present

## 2014-02-01 DIAGNOSIS — K219 Gastro-esophageal reflux disease without esophagitis: Secondary | ICD-10-CM | POA: Insufficient documentation

## 2014-02-01 DIAGNOSIS — Z7952 Long term (current) use of systemic steroids: Secondary | ICD-10-CM | POA: Insufficient documentation

## 2014-02-01 DIAGNOSIS — Z794 Long term (current) use of insulin: Secondary | ICD-10-CM | POA: Insufficient documentation

## 2014-02-01 DIAGNOSIS — I10 Essential (primary) hypertension: Secondary | ICD-10-CM | POA: Diagnosis not present

## 2014-02-01 DIAGNOSIS — N200 Calculus of kidney: Secondary | ICD-10-CM | POA: Diagnosis not present

## 2014-02-01 DIAGNOSIS — Z888 Allergy status to other drugs, medicaments and biological substances status: Secondary | ICD-10-CM | POA: Insufficient documentation

## 2014-02-01 DIAGNOSIS — Z87442 Personal history of urinary calculi: Secondary | ICD-10-CM | POA: Insufficient documentation

## 2014-02-01 DIAGNOSIS — Z87891 Personal history of nicotine dependence: Secondary | ICD-10-CM | POA: Insufficient documentation

## 2014-02-01 DIAGNOSIS — Z7982 Long term (current) use of aspirin: Secondary | ICD-10-CM | POA: Insufficient documentation

## 2014-02-01 DIAGNOSIS — E669 Obesity, unspecified: Secondary | ICD-10-CM | POA: Insufficient documentation

## 2014-02-01 DIAGNOSIS — M19071 Primary osteoarthritis, right ankle and foot: Secondary | ICD-10-CM | POA: Insufficient documentation

## 2014-02-01 HISTORY — PX: NEPHROLITHOTOMY: SHX5134

## 2014-02-01 LAB — BASIC METABOLIC PANEL
Anion gap: 18 — ABNORMAL HIGH (ref 5–15)
BUN: 18 mg/dL (ref 6–23)
CO2: 21 mEq/L (ref 19–32)
Calcium: 11.2 mg/dL — ABNORMAL HIGH (ref 8.4–10.5)
Chloride: 99 mEq/L (ref 96–112)
Creatinine, Ser: 1.08 mg/dL (ref 0.50–1.35)
GFR calc Af Amer: 78 mL/min — ABNORMAL LOW (ref >90)
GFR calc non Af Amer: 68 mL/min — ABNORMAL LOW (ref >90)
Glucose, Bld: 142 mg/dL — ABNORMAL HIGH (ref 70–99)
Potassium: 4.2 mEq/L (ref 3.7–5.3)
Sodium: 138 mEq/L (ref 137–147)

## 2014-02-01 LAB — CBC WITH DIFFERENTIAL/PLATELET
Basophils Absolute: 0 10*3/uL (ref 0.0–0.1)
Basophils Relative: 0 % (ref 0–1)
Eosinophils Absolute: 0 10*3/uL (ref 0.0–0.7)
Eosinophils Relative: 0 % (ref 0–5)
HCT: 45 % (ref 39.0–52.0)
Hemoglobin: 16.1 g/dL (ref 13.0–17.0)
Lymphocytes Relative: 5 % — ABNORMAL LOW (ref 12–46)
Lymphs Abs: 0.6 10*3/uL — ABNORMAL LOW (ref 0.7–4.0)
MCH: 32.1 pg (ref 26.0–34.0)
MCHC: 35.8 g/dL (ref 30.0–36.0)
MCV: 89.6 fL (ref 78.0–100.0)
Monocytes Absolute: 0.3 10*3/uL (ref 0.1–1.0)
Monocytes Relative: 2 % — ABNORMAL LOW (ref 3–12)
Neutro Abs: 11.6 10*3/uL — ABNORMAL HIGH (ref 1.7–7.7)
Neutrophils Relative %: 93 % — ABNORMAL HIGH (ref 43–77)
Platelets: 260 10*3/uL (ref 150–400)
RBC: 5.02 MIL/uL (ref 4.22–5.81)
RDW: 12.4 % (ref 11.5–15.5)
WBC: 12.5 10*3/uL — ABNORMAL HIGH (ref 4.0–10.5)

## 2014-02-01 LAB — PROTIME-INR
INR: 1.04 (ref 0.00–1.49)
Prothrombin Time: 13.7 seconds (ref 11.6–15.2)

## 2014-02-01 LAB — APTT: aPTT: 23 seconds — ABNORMAL LOW (ref 24–37)

## 2014-02-01 SURGERY — NEPHROLITHOTOMY PERCUTANEOUS
Anesthesia: General | Laterality: Left

## 2014-02-01 MED ORDER — PROPOFOL 10 MG/ML IV BOLUS
INTRAVENOUS | Status: DC | PRN
  Administered 2014-02-01: 150 mg via INTRAVENOUS

## 2014-02-01 MED ORDER — BISOPROLOL-HYDROCHLOROTHIAZIDE 5-6.25 MG PO TABS: 1.0000 | ORAL_TABLET | Freq: Every day | ORAL | Status: DC

## 2014-02-01 MED ORDER — AZITHROMYCIN 250 MG PO TABS
250.0000 mg | ORAL_TABLET | Freq: Every day | ORAL | Status: DC
Start: 2014-02-01 — End: 2014-02-01

## 2014-02-01 MED ORDER — CEFAZOLIN SODIUM-DEXTROSE 2-3 GM-% IV SOLR
INTRAVENOUS | Status: AC
  Filled 2014-02-01: qty 50

## 2014-02-01 MED ORDER — GLYCOPYRROLATE 0.2 MG/ML IJ SOLN
INTRAMUSCULAR | Status: DC | PRN
  Administered 2014-02-01: .8 mg via INTRAVENOUS

## 2014-02-01 MED ORDER — SUCCINYLCHOLINE CHLORIDE 20 MG/ML IJ SOLN
INTRAMUSCULAR | Status: DC | PRN
  Administered 2014-02-01: 100 mg via INTRAVENOUS

## 2014-02-01 MED ORDER — IOHEXOL 300 MG/ML  SOLN
INTRAMUSCULAR | Status: DC | PRN
  Administered 2014-02-01: 10 mL

## 2014-02-01 MED ORDER — GLYCOPYRROLATE 0.2 MG/ML IJ SOLN
INTRAMUSCULAR | Status: AC
  Filled 2014-02-01: qty 3

## 2014-02-01 MED ORDER — PROPOFOL 10 MG/ML IV BOLUS
INTRAVENOUS | Status: AC
  Filled 2014-02-01: qty 20

## 2014-02-01 MED ORDER — LOSARTAN POTASSIUM 50 MG PO TABS
100.0000 mg | ORAL_TABLET | Freq: Every day | ORAL | Status: DC
  Administered 2014-02-02: 100 mg via ORAL
  Filled 2014-02-01: qty 2

## 2014-02-01 MED ORDER — ONDANSETRON HCL 4 MG/2ML IJ SOLN: 4.0000 mg | Freq: Once | INTRAMUSCULAR | Status: DC | PRN

## 2014-02-01 MED ORDER — CISATRACURIUM BESYLATE (PF) 10 MG/5ML IV SOLN
INTRAVENOUS | Status: DC | PRN
  Administered 2014-02-01: 2 mg via INTRAVENOUS
  Administered 2014-02-01: 6 mg via INTRAVENOUS

## 2014-02-01 MED ORDER — SODIUM CHLORIDE 0.9 % IR SOLN
Status: DC | PRN
  Administered 2014-02-01 (×3): 3000 mL

## 2014-02-01 MED ORDER — FENOFIBRATE 160 MG PO TABS
160.0000 mg | ORAL_TABLET | Freq: Every day | ORAL | Status: DC
  Administered 2014-02-02: 160 mg via ORAL
  Filled 2014-02-01: qty 1

## 2014-02-01 MED ORDER — DEXAMETHASONE SODIUM PHOSPHATE 10 MG/ML IJ SOLN
INTRAMUSCULAR | Status: AC
  Filled 2014-02-01: qty 1

## 2014-02-01 MED ORDER — IOHEXOL 300 MG/ML  SOLN
20.0000 mL | Freq: Once | INTRAMUSCULAR | Status: AC | PRN
  Administered 2014-02-01: 1 mL

## 2014-02-01 MED ORDER — ROCURONIUM BROMIDE 100 MG/10ML IV SOLN
INTRAVENOUS | Status: AC
  Filled 2014-02-01: qty 1

## 2014-02-01 MED ORDER — CEPHALEXIN 500 MG PO CAPS
500.0000 mg | ORAL_CAPSULE | Freq: Three times a day (TID) | ORAL | Status: DC
  Administered 2014-02-01 – 2014-02-02 (×3): 500 mg via ORAL
  Filled 2014-02-01 (×4): qty 1

## 2014-02-01 MED ORDER — FENTANYL CITRATE 0.05 MG/ML IJ SOLN: 25.0000 ug | INTRAMUSCULAR | Status: DC | PRN

## 2014-02-01 MED ORDER — MIDAZOLAM HCL 2 MG/2ML IJ SOLN
INTRAMUSCULAR | Status: AC
  Filled 2014-02-01: qty 2

## 2014-02-01 MED ORDER — PANTOPRAZOLE SODIUM 40 MG PO TBEC
40.0000 mg | DELAYED_RELEASE_TABLET | Freq: Every day | ORAL | Status: DC
  Administered 2014-02-02: 40 mg via ORAL
  Filled 2014-02-01: qty 1

## 2014-02-01 MED ORDER — ZOLPIDEM TARTRATE 5 MG PO TABS: 5.0000 mg | ORAL_TABLET | Freq: Every evening | ORAL | Status: DC | PRN

## 2014-02-01 MED ORDER — PREDNISONE 50 MG PO TABS
50.0000 mg | ORAL_TABLET | Freq: Two times a day (BID) | ORAL | Status: DC
  Administered 2014-02-01 – 2014-02-02 (×2): 50 mg via ORAL
  Filled 2014-02-01 (×3): qty 1

## 2014-02-01 MED ORDER — FENTANYL CITRATE 0.05 MG/ML IJ SOLN
INTRAMUSCULAR | Status: AC | PRN
  Administered 2014-02-01: 100 ug via INTRAVENOUS
  Administered 2014-02-01 (×2): 50 ug via INTRAVENOUS

## 2014-02-01 MED ORDER — GLYCOPYRROLATE 0.2 MG/ML IJ SOLN
INTRAMUSCULAR | Status: AC
  Filled 2014-02-01: qty 1

## 2014-02-01 MED ORDER — ONDANSETRON HCL 4 MG/2ML IJ SOLN: 4.0000 mg | INTRAMUSCULAR | Status: DC | PRN

## 2014-02-01 MED ORDER — DOCUSATE SODIUM 100 MG PO CAPS
100.0000 mg | ORAL_CAPSULE | Freq: Two times a day (BID) | ORAL | Status: DC
  Administered 2014-02-01 – 2014-02-02 (×2): 100 mg via ORAL
  Filled 2014-02-01 (×2): qty 1

## 2014-02-01 MED ORDER — LOSARTAN POTASSIUM 50 MG PO TABS: 100.0000 mg | ORAL_TABLET | Freq: Every morning | ORAL | Status: DC

## 2014-02-01 MED ORDER — ONDANSETRON HCL 4 MG/2ML IJ SOLN
INTRAMUSCULAR | Status: AC
  Filled 2014-02-01: qty 2

## 2014-02-01 MED ORDER — FENTANYL CITRATE 0.05 MG/ML IJ SOLN
INTRAMUSCULAR | Status: AC
  Filled 2014-02-01: qty 5

## 2014-02-01 MED ORDER — DEXAMETHASONE SODIUM PHOSPHATE 10 MG/ML IJ SOLN
INTRAMUSCULAR | Status: DC | PRN
  Administered 2014-02-01: 10 mg via INTRAVENOUS

## 2014-02-01 MED ORDER — MIDAZOLAM HCL 2 MG/2ML IJ SOLN
INTRAMUSCULAR | Status: AC
  Filled 2014-02-01: qty 6

## 2014-02-01 MED ORDER — FENOFIBRATE 54 MG PO TABS: 54.0000 mg | ORAL_TABLET | Freq: Every day | ORAL | Status: DC

## 2014-02-01 MED ORDER — 0.9 % SODIUM CHLORIDE (POUR BTL) OPTIME
TOPICAL | Status: DC | PRN
  Administered 2014-02-01: 1000 mL

## 2014-02-01 MED ORDER — SODIUM CHLORIDE 0.9 % IV SOLN
INTRAVENOUS | Status: DC
  Administered 2014-02-01: 09:00:00 via INTRAVENOUS

## 2014-02-01 MED ORDER — CIPROFLOXACIN IN D5W 400 MG/200ML IV SOLN: 400.0000 mg | INTRAVENOUS | Status: DC

## 2014-02-01 MED ORDER — LIDOCAINE HCL (CARDIAC) 20 MG/ML IV SOLN
INTRAVENOUS | Status: AC
  Filled 2014-02-01: qty 5

## 2014-02-01 MED ORDER — LACTATED RINGERS IV SOLN
INTRAVENOUS | Status: DC | PRN
  Administered 2014-02-01 (×2): via INTRAVENOUS

## 2014-02-01 MED ORDER — FENTANYL CITRATE 0.05 MG/ML IJ SOLN
INTRAMUSCULAR | Status: AC
  Filled 2014-02-01: qty 6

## 2014-02-01 MED ORDER — ONDANSETRON HCL 4 MG/2ML IJ SOLN
INTRAMUSCULAR | Status: DC | PRN
  Administered 2014-02-01: 4 mg via INTRAVENOUS

## 2014-02-01 MED ORDER — BENZONATATE 100 MG PO CAPS: 200.0000 mg | ORAL_CAPSULE | Freq: Three times a day (TID) | ORAL | Status: DC | PRN

## 2014-02-01 MED ORDER — BISOPROLOL FUMARATE 5 MG PO TABS
5.0000 mg | ORAL_TABLET | Freq: Once | ORAL | Status: AC
  Administered 2014-02-01: 5 mg via ORAL
  Filled 2014-02-01: qty 1

## 2014-02-01 MED ORDER — FENTANYL CITRATE 0.05 MG/ML IJ SOLN
INTRAMUSCULAR | Status: DC | PRN
  Administered 2014-02-01 (×2): 25 ug via INTRAVENOUS

## 2014-02-01 MED ORDER — HYDROMORPHONE HCL 1 MG/ML IJ SOLN
0.5000 mg | INTRAMUSCULAR | Status: DC | PRN
  Administered 2014-02-01 (×2): 1 mg via INTRAVENOUS
  Filled 2014-02-01 (×2): qty 1

## 2014-02-01 MED ORDER — CEFAZOLIN SODIUM-DEXTROSE 2-3 GM-% IV SOLR
2.0000 g | Freq: Once | INTRAVENOUS | Status: AC
  Administered 2014-02-01: 2 g via INTRAVENOUS

## 2014-02-01 MED ORDER — ACETAMINOPHEN 325 MG PO TABS: 650.0000 mg | ORAL_TABLET | ORAL | Status: DC | PRN

## 2014-02-01 MED ORDER — LIDOCAINE HCL 1 % IJ SOLN
INTRAMUSCULAR | Status: AC
  Filled 2014-02-01: qty 20

## 2014-02-01 MED ORDER — BISOPROLOL-HYDROCHLOROTHIAZIDE 5-6.25 MG PO TABS
1.0000 | ORAL_TABLET | Freq: Every day | ORAL | Status: DC
  Administered 2014-02-01: 1 via ORAL
  Filled 2014-02-01: qty 1

## 2014-02-01 MED ORDER — MIDAZOLAM HCL 2 MG/2ML IJ SOLN
INTRAMUSCULAR | Status: AC | PRN
  Administered 2014-02-01: 1 mg via INTRAVENOUS
  Administered 2014-02-01: 2 mg via INTRAVENOUS

## 2014-02-01 MED ORDER — DIPHENHYDRAMINE HCL 50 MG PO CAPS
50.0000 mg | ORAL_CAPSULE | Freq: Two times a day (BID) | ORAL | Status: DC
  Administered 2014-02-01: 50 mg via ORAL
  Filled 2014-02-01 (×4): qty 1

## 2014-02-01 MED ORDER — DEXTROSE-NACL 5-0.45 % IV SOLN
INTRAVENOUS | Status: DC
  Administered 2014-02-01 – 2014-02-02 (×2): via INTRAVENOUS

## 2014-02-01 MED ORDER — CEPHALEXIN 250 MG PO CAPS: 250.0000 mg | ORAL_CAPSULE | Freq: Two times a day (BID) | ORAL | Status: DC

## 2014-02-01 MED ORDER — NEOSTIGMINE METHYLSULFATE 10 MG/10ML IV SOLN
INTRAVENOUS | Status: DC | PRN
  Administered 2014-02-01: 4 mg via INTRAVENOUS

## 2014-02-01 MED ORDER — CISATRACURIUM BESYLATE 20 MG/10ML IV SOLN
INTRAVENOUS | Status: AC
  Filled 2014-02-01: qty 10

## 2014-02-01 MED ORDER — OXYCODONE HCL 5 MG PO TABS
5.0000 mg | ORAL_TABLET | ORAL | Status: DC | PRN
  Administered 2014-02-01 – 2014-02-02 (×4): 5 mg via ORAL
  Filled 2014-02-01 (×4): qty 1

## 2014-02-01 SURGICAL SUPPLY — 49 items
APL SKNCLS STERI-STRIP NONHPOA (GAUZE/BANDAGES/DRESSINGS) ×2
BAG URINE DRAINAGE (UROLOGICAL SUPPLIES) ×2 IMPLANT
BASKET STONE NCOMPASS (UROLOGICAL SUPPLIES) ×1 IMPLANT
BASKET ZERO TIP NITINOL 2.4FR (BASKET) ×2 IMPLANT
BENZOIN TINCTURE PRP APPL 2/3 (GAUZE/BANDAGES/DRESSINGS) ×4 IMPLANT
BLADE SURG 15 STRL LF DISP TIS (BLADE) ×1 IMPLANT
BLADE SURG 15 STRL SS (BLADE) ×2
BSKT STON RTRVL ZERO TP 2.4FR (BASKET) ×1
CARTRIDGE STONEBREAK CO2 KIDNE (ELECTROSURGICAL) ×1 IMPLANT
CATH FOLEY 2W COUNCIL 20FR 5CC (CATHETERS) IMPLANT
CATH FOLEY 2W COUNCIL 5CC 18FR (CATHETERS) ×1 IMPLANT
CATH ROBINSON RED A/P 20FR (CATHETERS) IMPLANT
CATH X-FORCE N30 NEPHROSTOMY (TUBING) ×2 IMPLANT
COVER SURGICAL LIGHT HANDLE (MISCELLANEOUS) ×2 IMPLANT
DRAPE C-ARM 42X120 X-RAY (DRAPES) ×2 IMPLANT
DRAPE CAMERA CLOSED 9X96 (DRAPES) ×2 IMPLANT
DRAPE LINGEMAN PERC (DRAPES) ×2 IMPLANT
DRAPE SURG IRRIG POUCH 19X23 (DRAPES) ×2 IMPLANT
DRSG PAD ABDOMINAL 8X10 ST (GAUZE/BANDAGES/DRESSINGS) ×4 IMPLANT
DRSG TEGADERM 8X12 (GAUZE/BANDAGES/DRESSINGS) ×2 IMPLANT
FIBER LASER FLEXIVA 1000 (UROLOGICAL SUPPLIES) ×1 IMPLANT
FIBER LASER FLEXIVA 200 (UROLOGICAL SUPPLIES) ×1 IMPLANT
FIBER LASER FLEXIVA 365 (UROLOGICAL SUPPLIES) ×1 IMPLANT
FIBER LASER FLEXIVA 550 (UROLOGICAL SUPPLIES) ×1 IMPLANT
FIBER LASER TRAC TIP (UROLOGICAL SUPPLIES) ×1 IMPLANT
GAUZE SPONGE 4X4 12PLY STRL (GAUZE/BANDAGES/DRESSINGS) ×2 IMPLANT
GLOVE BIOGEL M 8.0 STRL (GLOVE) ×2 IMPLANT
GOWN STRL REUS W/TWL XL LVL3 (GOWN DISPOSABLE) ×2 IMPLANT
GUIDEWIRE AMPLAZ .035X145 (WIRE) ×2 IMPLANT
KIT BASIN OR (CUSTOM PROCEDURE TRAY) ×2 IMPLANT
MANIFOLD NEPTUNE II (INSTRUMENTS) ×2 IMPLANT
NS IRRIG 1000ML POUR BTL (IV SOLUTION) ×2 IMPLANT
PACK BASIC VI WITH GOWN DISP (CUSTOM PROCEDURE TRAY) ×2 IMPLANT
PROBE KIDNEY STONEBRKR 2.0X425 (ELECTROSURGICAL) ×2 IMPLANT
PROBE LITHOCLAST ULTRA 3.8X403 (UROLOGICAL SUPPLIES) ×1 IMPLANT
PROBE PNEUMATIC 1.0MMX570MM (UROLOGICAL SUPPLIES) ×2 IMPLANT
SET IRRIG Y TYPE TUR BLADDER L (SET/KITS/TRAYS/PACK) ×2 IMPLANT
SET WARMING FLUID IRRIGATION (MISCELLANEOUS) ×1 IMPLANT
SPONGE LAP 4X18 X RAY DECT (DISPOSABLE) ×2 IMPLANT
STONE CATCHER W/TUBE ADAPTER (UROLOGICAL SUPPLIES) ×2 IMPLANT
SUT SILK 2 0 30  PSL (SUTURE) ×1
SUT SILK 2 0 30 PSL (SUTURE) ×1 IMPLANT
SYR 20CC LL (SYRINGE) ×4 IMPLANT
SYRINGE 10CC LL (SYRINGE) ×2 IMPLANT
TRAY FOLEY BAG SILVER LF 14FR (CATHETERS) ×2 IMPLANT
TRAY FOLEY CATH 14FRSI W/METER (CATHETERS) ×1 IMPLANT
TRAY FOLEY CATH 16FR SILVER (SET/KITS/TRAYS/PACK) ×1 IMPLANT
TUBING CONNECTING 10 (TUBING) ×6 IMPLANT
WATER STERILE IRR 1500ML POUR (IV SOLUTION) ×2 IMPLANT

## 2014-02-01 NOTE — Procedures (Signed)
Successful placement of left nephroureteral catheter from a lower pole approach.  Catheter advanced into urinary bladder.  Large staghorn calculus in renal pelvis with smaller stones in lower pole collecting system.  No immediate complication.   Patient will go to OR for stone removal.  See dictated Radiology report.

## 2014-02-01 NOTE — H&P (Signed)
  Urology History and Physical Exam  CC: Kidney stone  HPI: 73 year old male presents for percutaneous management of a 4 x 2.5 cm symptomatic left renal calculus. Because of the size, it was recommended patient undergo percutaneous management. The procedure, as well as risks and complications were discussed with the patient. He understands these and desires to proceed.  PMH: Past Medical History  Diagnosis Date  . Hyperlipidemia   . Hypertension   . GERD (gastroesophageal reflux disease)   . Obese   . History of kidney stones     hx. multiple kidney stones  . Asthma     childhood yrs  only  . Arthritis     right foot    PSH: Past Surgical History  Procedure Laterality Date  . Tumor ear Left     age 59 topical growth behind left ear.  . Cystoscopy w/ ureteroscopy w/ lithotripsy    . Inguinal hernia repair      left  . Colonoscopy    . Upper gastrointestinal endoscopy      Allergies: Allergies  Allergen Reactions  . Iohexol Shortness Of Breath  . Ace Inhibitors     cough    Medications: No prescriptions prior to admission     Social History: History   Social History  . Marital Status: Married    Spouse Name: N/A    Number of Children: N/A  . Years of Education: N/A   Occupational History  . Not on file.   Social History Main Topics  . Smoking status: Former Smoker    Quit date: 03/02/1974  . Smokeless tobacco: Not on file  . Alcohol Use: 4.2 oz/week    7 Glasses of wine per week  . Drug Use: No  . Sexual Activity: Yes   Other Topics Concern  . Not on file   Social History Narrative    Family History: No family history on file.  Review of Systems: Positive: Occasional flank pain Negative: A further 10 point review of systems was negative except what is listed in the HPI.                  Physical Exam: @VITALS2 @ General: No acute distress.  Awake. Head:  Normocephalic.  Atraumatic. ENT:  EOMI.  Mucous membranes moist Neck:  Supple.  No  lymphadenopathy. CV:  S1 present. S2 present. Regular rate. Pulmonary: Equal effort bilaterally.  Clear to auscultation bilaterally. Abdomen: Soft.  Non- tender to palpation. Skin:  Normal turgor.  No visible rash. Extremity: No gross deformity of bilateral upper extremities.  No gross deformity of                             lower extremities. Neurologic: Alert. Appropriate mood.  Penis:  circumcised.  No lesions. Urethra: Orthotopic meatus. Scrotum: No lesions.  No ecchymosis.  No erythema. Testicles: Descended bilaterally.  No masses bilaterally. Epididymis: Palpable bilaterally. Nontender to palpation.  Studies:  No results for input(s): HGB, WBC, PLT in the last 72 hours.  No results for input(s): NA, K, CL, CO2, BUN, CREATININE, CALCIUM, GFRNONAA, GFRAA in the last 72 hours.  Invalid input(s): MAGNESIUM   No results for input(s): INR, APTT in the last 72 hours.  Invalid input(s): PT   Invalid input(s): ABG    Assessment:  Large branched left renal stone A  Plan:  Left percutaneous nephrolithotomy

## 2014-02-01 NOTE — Plan of Care (Signed)
Problem: Consults Goal: General Surgical Patient Education (See Patient Education module for education specifics)  Outcome: Progressing  Problem: Phase I Progression Outcomes Goal: Pain controlled with appropriate interventions Outcome: Progressing Goal: OOB as tolerated unless otherwise ordered Outcome: Progressing Goal: Incision/dressings dry and intact Outcome: Completed/Met Date Met:  02/01/14 Goal: Sutures/staples intact Outcome: Completed/Met Date Met:  02/01/14 Goal: Tubes/drains patent Outcome: Completed/Met Date Met:  02/01/14 Goal: Initial discharge plan identified Outcome: Completed/Met Date Met:  02/01/14 Goal: Voiding-avoid urinary catheter unless indicated Outcome: Progressing Goal: Vital signs/hemodynamically stable Outcome: Completed/Met Date Met:  02/01/14 Goal: Other Phase I Outcomes/Goals Outcome: Not Applicable Date Met:  88/41/66  Problem: Phase II Progression Outcomes Goal: Progressing with IS, TCDB Outcome: Completed/Met Date Met:  02/01/14

## 2014-02-01 NOTE — Op Note (Signed)
Preoperative diagnosis: 4 cm left renal calculus  Postoperative diagnosis: Same   Procedure: Left percutaneous nephrolithotomy    Surgeon: Lillette Boxer. Etha Stambaugh, M.D.   Anesthesia: Gen.   Complications: None  Specimen(s): Stone fragments, the patient's family  Drain(s): 28 [place] council catheter  Indications: 73 year old male with a greater than 4 cm left renal stone. He has minimal symptomatology but occasional pain, more on the right than the left actually. He has been prepared for surgical management of this. I discussed management including lithotripsy, ureteroscopy with holmium laser and extraction as well as percutaneous management. Due to the size of the stone, I have strongly recommended we proceed with percutaneous management. Risks and complications of the procedure have been discussed with the patient. He understands these and desires to proceed.    Technique and findings: The patient was properly identified and marked in the holding area. He received IV antibiotics in the interventional radiology suite. He was then taken to the operating room where general tracheal anesthetic was administered. His bladder was catheterized with a 16 French Foley catheter and hooked to dependent drainage. The patient was then placed in the prone position. We took great care in positioning the patient, with minimal torsion on either arm or shoulder. All pressure points were padded appropriately. His left flank was then prepped and draped. Proper timeout was performed.  The patient had an indwelling Kumpe catheter. I passed a guidewire through this, and fluoroscopically guided it into the patient's bladder. The Kumpe catheter was removed, and I incised the patient's percutaneous site, for approximate 1/2 cm with a knife. I then, over the guidewire, passed a 12 French peel-away sheath over the guidewire. The core was removed, and a second guidewire was passed into the bladder using fluoroscopic guidance.  I then removed the peel-away sheath. A NephroMax balloon was then passed over the working guidewire, with the distal end guided up to the renal pelvic stone. The lumen was inflated to 16 atm of pressure. The nephrostomy working sheath was then passed over the nephrostomy balloon up to the stone using fluoroscopic guidance. The balloon was deflated, and the balloon apparatus was removed.  I then passed the nephroscope up to the stone, through the sheath. The stone was easily encountered. I first started fragmenting the stone with the [facility]. The stone was quite fragile, and easily fragmented. Of the easy fragments were removed with the graspers. I then passed the lithoclast, and using the ultrasonographic lithotrite and the suction, easily fragmented and aspirated all remaining stone. The pelvis was quite dilated, and it was quite easy to inspect the entire pelvis and upper pole calyces. Even the tiniest fragments were easily aspirated through the system. Following use of a lithotrite, I passed the flexible cystoscope through the nephrostomy sheath. I inspected the upper pole calyces as well as the pelvis. No further stone fragments were seen. Several small fragments were retrieved from the ureter, which was easily access with the cystoscope. Following extraction of all visible stones, the cystoscope was removed. I then passed an 34 [place] council tip catheter over the guidewire, through the nephrostomy sheath. This was positioned using fluoroscopic guidance, and injection of Omnipaque. The tip of the catheter was approximately at the UPJ. The balloon was filled with 10 mL of 50-50 solution of water and Omnipaque. Following filling of the balloon, the catheter was again injected. There was movement of the contrast distally down the ureter. No extravasation was seen. No filling defects were seen. At this point, the  nephrostomy sheath was removed, and both guidewires. The incision was then reapproximated using  2-0 silk placed in the vertical mattress fashion. The catheter was sewn with the same 2-0 silk. Dry sterile dressing was placed and the nephrostomy/council tip catheter placed to bag drainage.  The patient tolerated procedure well. Estimated blood loss was approximately 100 mL. He was taken to the PACU in stable condition.

## 2014-02-01 NOTE — Transfer of Care (Signed)
Immediate Anesthesia Transfer of Care Note  Patient: [name] [name]  Procedure(s) Performed: Procedure(s): NEPHROLITHOTOMY PERCUTANEOUS (Left)  Patient Location: PACU  Anesthesia Type:General  Level of Consciousness: awake and patient cooperative  Airway & Oxygen Therapy: Patient Spontanous Breathing and Patient connected to face mask oxygen  Post-op Assessment: Report given to PACU RN and Post -op Vital signs reviewed and stable  Post vital signs: Reviewed and stable  Complications: No apparent anesthesia complications

## 2014-02-01 NOTE — Anesthesia Postprocedure Evaluation (Signed)
  Anesthesia Post-op Note  Patient: [name] [name]  Procedure(s) Performed: Procedure(s) (LRB): NEPHROLITHOTOMY PERCUTANEOUS (Left)  Patient Location: PACU  Anesthesia Type: General  Level of Consciousness: awake and alert   Airway and Oxygen Therapy: Patient Spontanous Breathing  Post-op Pain: mild  Post-op Assessment: Post-op Vital signs reviewed, Patient's Cardiovascular Status Stable, Respiratory Function Stable, Patent Airway and No signs of Nausea or vomiting  Last Vitals:  Filed Vitals:   02/01/14 1400  BP: 125/75  Pulse: 79  Temp: 36.4 C  Resp: 7    Post-op Vital Signs: stable   Complications: No apparent anesthesia complications

## 2014-02-01 NOTE — H&P (Signed)
Chief Complaint: "I am here for a procedure for my kidney stone."  Referring Physician(s): Dahlstedt,Stephen M  History of Present Illness: [name] [name] is a 73 y.o. male with history of kidney stones. He now presents with a large left sided kidney stone and has been seen in the office by Dr. Diona Fanti. He is scheduled today for left PCN in IR and OR for PCNL to follow. He denies any chest pain, shortness of breath or palpitations. He denies any active signs of bleeding or excessive bruising. He denies any recent fever or chills. The patient denies any history of sleep apnea, but has not been tested. He has previously tolerated sedation without complications.    Past Medical History  Diagnosis Date  . Hyperlipidemia   . Hypertension   . GERD (gastroesophageal reflux disease)   . Obese   . History of kidney stones     hx. multiple kidney stones  . Asthma     childhood yrs  only  . Arthritis     right foot    Past Surgical History  Procedure Laterality Date  . Tumor ear Left     age 68 topical growth behind left ear.  . Cystoscopy w/ ureteroscopy w/ lithotripsy    . Inguinal hernia repair      left  . Colonoscopy    . Upper gastrointestinal endoscopy      Allergies: Iohexol and Ace inhibitors  Medications: Prior to Admission medications   Medication Sig Start Date End Date Taking? Authorizing Provider  aspirin 81 MG tablet Take 81 mg by mouth every evening.     Historical Provider, MD  azithromycin (ZITHROMAX) 250 MG tablet Take as directed Patient not taking: Reported on 01/19/2014 12/19/13   Unk Pinto, MD  B Complex Vitamins (VITAMIN-B COMPLEX PO) Take 1 capsule by mouth every evening. Vegetable formula    Historical Provider, MD  benzonatate (TESSALON PERLES) 100 MG capsule 1-2 perles TID to prevent cough Patient not taking: Reported on 01/19/2014 06/15/13   Unk Pinto, MD  bisoprolol-hydrochlorothiazide [facility]) 5-6.25 MG per tablet TAKE 1 TABLET BY  MOUTH EVERY MORNING FOR BLOOD PRESSURE 08/21/13   Unk Pinto, MD  bisoprolol-hydrochlorothiazide [facility]) 5-6.25 MG per tablet Take 1 tablet by mouth at bedtime.    Historical Provider, MD  Cholecalciferol (VITAMIN D) 2000 UNITS tablet Take 4,000-6,000 Units by mouth daily. Three capsules in the morning = 6000 units and two capsules in the evening= 4000 units for a total of 10,000 mg units.    Historical Provider, MD  diphenhydrAMINE (BENADRYL) 25 mg capsule Take 50 mg by mouth every 6 (six) hours as needed.    Historical Provider, MD  fenofibrate micronized (LOFIBRA) 134 MG capsule TAKE 1 CAPSULE BY MOUTH EVERY DAY. 01/16/14   Unk Pinto, MD  fenofibrate micronized (LOFIBRA) 134 MG capsule Take 134 mg by mouth daily before breakfast.    Historical Provider, MD  fish oil-omega-3 fatty acids 1000 MG capsule Take 1 g by mouth 2 (two) times daily. Total of 2400 mg daily.    Historical Provider, MD  Flaxseed, Linseed, (FLAXSEED OIL PO) Take 1 capsule by mouth every morning. 1000mg     Historical Provider, MD  losartan (COZAAR) 100 MG tablet TAKE 1 TABLET BY MOUTH EVERY DAY FOR BLOOD PRESSURE 09/04/13   Unk Pinto, MD  losartan (COZAAR) 100 MG tablet Take 100 mg by mouth every morning. Takes bedtime    Historical Provider, MD  Magnesium 250 MG TABS Take 250 mg  by mouth every morning.     Historical Provider, MD  meloxicam (MOBIC) 15 MG tablet Take 1 tablet (15 mg total) by mouth daily. Patient taking differently: Take 15 mg by mouth every morning.  12/01/13   Unk Pinto, MD  omeprazole (PRILOSEC) 40 MG capsule Take 1 capsule (40 mg total) by mouth daily. Patient taking differently: Take 40 mg by mouth every morning.  08/09/13   Unk Pinto, MD  predniSONE (DELTASONE) 50 MG tablet Take 50 mg by mouth daily with breakfast.    Historical Provider, MD    History reviewed. No pertinent family history.  History   Social History  . Marital Status: Married    Spouse Name: N/A    Number of  Children: N/A  . Years of Education: N/A   Social History Main Topics  . Smoking status: Former Smoker    Quit date: 03/02/1974  . Smokeless tobacco: None  . Alcohol Use: 4.2 oz/week    7 Glasses of wine per week  . Drug Use: No  . Sexual Activity: Yes   Other Topics Concern  . None   Social History Narrative    Review of Systems: A 12 point ROS discussed and pertinent positives are indicated in the HPI above.  All other systems are negative.  Review of Systems  Vital Signs: T: 97.27F, HR: 61bpm, BP: 144/88 mmHg, O2: 98% RA  Physical Exam  Constitutional: He is oriented to person, place, and time. No distress.  HENT:  Head: Normocephalic and atraumatic.  Neck: No tracheal deviation present.  Cardiovascular: Normal rate and regular rhythm.  Exam reveals no gallop and no friction rub.   No murmur heard. Pulmonary/Chest: Effort normal and breath sounds normal. No respiratory distress. He has no wheezes. He has no rales.  Abdominal: Soft. Bowel sounds are normal. He exhibits no distension. There is no tenderness.  Neurological: He is alert and oriented to person, place, and time.  Skin: He is not diaphoretic.  Psychiatric: He has a normal mood and affect. His behavior is normal. Thought content normal.   Imaging: Dg Chest 2 View  01/19/2014   CLINICAL DATA:  Preop nephrolithiasis  EXAM: CHEST  2 VIEW  COMPARISON:  12/25/2009  FINDINGS: COPD with pulmonary hyperinflation. Negative for pneumonia or mass. Negative for heart failure. Heart size is normal.  IMPRESSION: No active cardiopulmonary disease.   Electronically Signed   By: Franchot Gallo M.D.   On: 01/19/2014 11:32    Labs:  CBC:  Recent Labs  06/13/13 1504 09/12/13 1404 01/19/14 1000 02/01/14 0900  WBC 8.7 6.7 7.1 12.5*  HGB 16.0 15.8 15.9 16.1  HCT 45.0 44.9 45.9 45.0  PLT 320 255 235 260    COAGS:  Recent Labs  02/01/14 0900  INR 1.04  APTT 23*    BMP:  Recent Labs  03/16/13 1419  06/13/13 1504 09/12/13 1404 01/19/14 1000  NA 137 137 136 135*  K 4.4 4.6 3.9 4.5  CL 101 102 102 99  CO2 24 28 24 25   GLUCOSE 122* 83 101* 100*  BUN 17 21 21 21   CALCIUM 11.1* 10.8* 11.2* 11.2*  CREATININE 1.25 1.34 1.30 1.22  GFRNONAA 58* 53* 55* 58*  GFRAA 67 62 64 68*    LIVER FUNCTION TESTS:  Recent Labs  03/16/13 1419 06/13/13 1504 09/12/13 1404  BILITOT 0.8 0.7 0.9  AST 31 26 26   ALT 35 28 25  ALKPHOS 45 39 42  PROT 6.9 6.6 6.8  ALBUMIN  4.2 4.0 4.6   Assessment and Plan: Left nephrolithiasis Scheduled today for left PCN in IR with moderate sedation then OR for PCNL Patient has been NPO, no blood thinners taken, labs reviewed Risks and Benefits discussed with the patient. All of the patient's questions were answered, patient is agreeable to proceed. Consent signed and in chart.    SignedHedy Jacob 02/01/2014, 10:00 AM

## 2014-02-02 ENCOUNTER — Encounter (HOSPITAL_COMMUNITY): Payer: Self-pay | Admitting: Urology

## 2014-02-02 DIAGNOSIS — N200 Calculus of kidney: Secondary | ICD-10-CM | POA: Diagnosis not present

## 2014-02-02 LAB — HEMOGLOBIN AND HEMATOCRIT, BLOOD
HCT: 43.7 % (ref 39.0–52.0)
Hemoglobin: 14.9 g/dL (ref 13.0–17.0)

## 2014-02-02 MED ORDER — CEPHALEXIN 500 MG PO CAPS: 500.0000 mg | ORAL_CAPSULE | Freq: Two times a day (BID) | ORAL | Status: DC

## 2014-02-02 MED ORDER — OXYCODONE HCL 5 MG PO TABS: 5.0000 mg | ORAL_TABLET | ORAL | Status: DC | PRN

## 2014-02-02 NOTE — Progress Notes (Signed)
Went over all discharge information with pt; all questions answered. MD paged and made aware of slight swelling and hardness at site that drain was taken out.  MD unconcerned with the assessment findings.  Prescriptions given to pt.  VSS.  Pt wheeled out by NT.

## 2014-02-02 NOTE — Progress Notes (Signed)
02/02/14 D/C HOME NO NEEDS OR ORDERS.

## 2014-02-02 NOTE — Discharge Instructions (Signed)
DISCHARGE INSTRUCTIONS FOR PERCUTANEOUS STONE SURGERY °MEDICATIONS:  °1. DO NOT RESUME YOUR IBUPROFEN, or any other medicines like aspirin, motrin, excedrin, advil, aleve, vitamin E, fish oil as these can all cause bleeding x 10 days.  °2. Resume all your other meds from home - except do not take any other pain meds that you may have at home. ° °ACTIVITY °1. No strenuous activity, sexual activity, or lifting greater than 10 pounds for 2 weeks. °2. No driving while on narcotic pain medications °3. Drink plenty of water °4. Continue to walk at home - you can still get blood clots when you are at home, so keep active, but don't over do it. °5. May return to work in 1 week (but not heavy or strenuous activity).  ° °BATHING °You can shower.  Cover your wound with a dressing and remove the dressing immediately after the shower.  Do not submerge wound under water.  ° °WOUND CARE °Your wound will drain bloody fluid and may do so for 7-14 days. You have 2 options for dressings:  °1. You may use gauze and tape to dress your wound.  If you choose this method, then change the dressing as it becomes soaked.  Change it at least once daily until it stops draining. You may switch to a Band Aid once drainage stops. °2. If drainage is copious, you may use an ostomy device.  This is a bag with an andhesive circle.  The circle has a hole in the middle of it and you cut the hole to the size needed to fit the wound.  This will collect the drainage in the bag and allow you to drain the bag as needed.  ° °SIGNS/SYMPTOMS TO CALL: °1. Please call us if you have a fever greater than 101.5, uncontrolled nausea/vomiting, uncontrolled pain, dizziness, unable to urinate, bloody urine, chest pain, shortness of breath, leg swelling, leg pain, redness around wound, drainage from wound, or any other concerns or questions. °2. You can reach us at [phone]. °FOLLOW-UP ° ° °

## 2014-02-12 NOTE — Discharge Summary (Signed)
Patient ID: [name] [name] MRN: [id number] DOB/AGE: [birth year] 73 y.o.  Admit date: 02/01/2014 Discharge date: 02/12/2014  Primary Care Physician:  Alesia Richards, MD  Discharge Diagnoses:   Left renal stone  Consults:  None   Discharge Medications:   Medication List    STOP taking these medications        aspirin 81 MG tablet     diphenhydrAMINE 25 mg capsule  Commonly known as:  BENADRYL     predniSONE 50 MG tablet  Commonly known as:  DELTASONE      TAKE these medications        azithromycin 250 MG tablet  Commonly known as:  ZITHROMAX  Take as directed     benzonatate 100 MG capsule  Commonly known as:  TESSALON PERLES  1-2 perles TID to prevent cough     bisoprolol-hydrochlorothiazide 5-6.25 MG per tablet  Commonly known as:  ZIAC  Take 1 tablet by mouth at bedtime.     bisoprolol-hydrochlorothiazide 5-6.25 MG per tablet  Commonly known as:  ZIAC  TAKE 1 TABLET BY MOUTH EVERY MORNING FOR BLOOD PRESSURE     cephALEXin 250 MG capsule  Commonly known as:  KEFLEX  Take 1 capsule (250 mg total) by mouth 2 (two) times daily.     cephALEXin 500 MG capsule  Commonly known as:  KEFLEX  Take 1 capsule (500 mg total) by mouth 2 (two) times daily.     fenofibrate micronized 134 MG capsule  Commonly known as:  LOFIBRA  Take 134 mg by mouth daily before breakfast.     fenofibrate micronized 134 MG capsule  Commonly known as:  LOFIBRA  TAKE 1 CAPSULE BY MOUTH EVERY DAY.     fish oil-omega-3 fatty acids 1000 MG capsule  Take 1 g by mouth 2 (two) times daily. Total of 2400 mg daily.     FLAXSEED OIL PO  Take 1 capsule by mouth every morning. 1000mg      losartan 100 MG tablet  Commonly known as:  COZAAR  Take 100 mg by mouth every morning. Takes bedtime     losartan 100 MG tablet  Commonly known as:  COZAAR  TAKE 1 TABLET BY MOUTH EVERY DAY FOR BLOOD PRESSURE     Magnesium 250 MG Tabs  Take 250 mg by mouth every morning.     meloxicam 15 MG  tablet  Commonly known as:  MOBIC  Take 1 tablet (15 mg total) by mouth daily.     omeprazole 40 MG capsule  Commonly known as:  PRILOSEC  Take 1 capsule (40 mg total) by mouth daily.     oxyCODONE 5 MG immediate release tablet  Commonly known as:  Oxy IR/ROXICODONE  Take 1 tablet (5 mg total) by mouth every 4 (four) hours as needed for moderate pain.     Vitamin D 2000 UNITS tablet  Take 4,000-6,000 Units by mouth daily. Three capsules in the morning = 6000 units and two capsules in the evening= 4000 units for a total of 10,000 mg units.     VITAMIN-B COMPLEX PO  Take 1 capsule by mouth every evening. Vegetable formula         Significant Diagnostic Studies:  Dg C-arm 61-120 Min-no Report  02/01/2014   CLINICAL DATA: left Perc   C-ARM 61-120 MINUTES  Fluoroscopy was utilized by the requesting physician.  No radiographic  interpretation.    Ir Oris Drone Cath Perc Left  02/01/2014   CLINICAL DATA:  73 year old  with a left kidney staghorn calculus and scheduled for percutaneous nephrolithotomy procedure. Percutaneous access is needed.  EXAM: PLACEMENT OF LEFT NEPHROURETERAL CATHETER WITH FLUOROSCOPIC GUIDANCE  Physician: Stephan Minister. Henn, MD  FLUOROSCOPY TIME:  19 min and 24 seconds.  676 mGy  MEDICATIONS: 2 g Ancef. 1.5 mg Versed, 200 mcg fentanyl. A radiology nurse monitored the patient for moderate sedation.  ANESTHESIA/SEDATION: Moderate sedation time: 50 min  PROCEDURE: The procedure was explained to the patient. The risks and benefits of the procedure were discussed and the patient's questions were addressed. Informed consent was obtained from the patient. Patient was placed prone. Left flank was prepped and draped in sterile fashion. Left kidney was evaluated with ultrasound and no significant hydronephrosis. A large cyst in the left kidney was identified. 1% lidocaine was used for local anesthetic. A 22 gauge needle was directed onto a lower pole stone. Contrast injection confirmed  placement in the lower pole calyx. A 0.018 wire was eventually advanced into the renal collecting system and the tract was dilated. A 5 French Kumpe catheter was placed. A Glidewire was successfully manipulated into the renal pelvis and eventually the left ureter. Kumpe catheter was exchanged for 5 [place] Glide catheter. The Glide catheter was advanced into the urinary bladder. Catheter was sutured to the skin. Bandage was placed over the catheter.  FINDINGS: Large staghorn calculus involving the renal pelvis. Additional stones in the left lower pole collecting system. A stone within a lower pole calyx was targeted with fluoroscopy. Access was obtained from a lower pole calyx.  COMPLICATIONS: None  IMPRESSION: Successful placement of a left nephroureteral catheter with fluoroscopy.   Electronically Signed   By: Markus Daft M.D.   On: 02/01/2014 17:10    Brief H and P: For complete details please refer to admission H and P, but in brief the patient was admitted for percutaneous management of a large left renal calculus.  Hospital Course:  Active Problems:   Renal calculus or stone  The patient underwent an uncomplicated left or cutaneous nephrolithotomy. He had no intraoperative or postoperative complications. His nephrostomy tube was removed on postoperative day #1. At that time he was discharged to home. Day of Discharge BP 150/73 mmHg  Pulse 51  Temp(Src) 97.5 F (36.4 C) (Oral)  Resp 20  Ht 5\' 11"  (1.803 m)  Wt 107.4 kg (236 lb 12.4 oz)  BMI 33.04 kg/m2  SpO2 99%  No results found for this or any previous visit (from the past 24 hour(s)).  Physical Exam: General: Alert and awake oriented x3 not in any acute distress. HEENT: anicteric sclera, pupils reactive to light and accommodation CVS: S1-S2 clear no murmur rubs or gallops Chest: clear to auscultation bilaterally, no wheezing rales or rhonchi Abdomen: soft nontender, nondistended, normal bowel sounds, no organomegaly Extremities:  no cyanosis, clubbing or edema noted bilaterally Neuro: Cranial nerves II-XII intact, no focal neurological deficits  Disposition:  Home  Diet:  No restrictions  Activity:  Activities discussed with patient   Disposition and Follow-up:     Discharge Instructions    Discharge patient    Complete by:  As directed             Given the patient  TESTS THAT NEED FOLLOW-UP  None  DISCHARGE FOLLOW-UP Follow-up Information    Follow up with Jorja Loa, MD.   Specialty:  Urology   Why:  as scheduled   Contact information:   [address] [place] [address] [phone]  Time spent on Discharge:  10 minutes  Signed: Jorja Loa 02/12/2014, 10:10 AM

## 2014-03-19 DIAGNOSIS — N400 Enlarged prostate without lower urinary tract symptoms: Secondary | ICD-10-CM | POA: Diagnosis not present

## 2014-03-19 DIAGNOSIS — N200 Calculus of kidney: Secondary | ICD-10-CM | POA: Diagnosis not present

## 2014-03-19 DIAGNOSIS — Z125 Encounter for screening for malignant neoplasm of prostate: Secondary | ICD-10-CM | POA: Diagnosis not present

## 2014-03-20 ENCOUNTER — Other Ambulatory Visit: Payer: Self-pay | Admitting: Internal Medicine

## 2014-03-29 ENCOUNTER — Ambulatory Visit: Payer: Self-pay | Admitting: Internal Medicine

## 2014-04-25 ENCOUNTER — Encounter: Payer: Self-pay | Admitting: Internal Medicine

## 2014-04-25 ENCOUNTER — Ambulatory Visit (INDEPENDENT_AMBULATORY_CARE_PROVIDER_SITE_OTHER): Payer: Medicare Other | Admitting: Internal Medicine

## 2014-04-25 VITALS — BP 138/80 | HR 50 | Temp 98.2°F | Resp 16 | Ht 71.5 in | Wt 247.0 lb

## 2014-04-25 DIAGNOSIS — I10 Essential (primary) hypertension: Secondary | ICD-10-CM | POA: Diagnosis not present

## 2014-04-25 DIAGNOSIS — E559 Vitamin D deficiency, unspecified: Secondary | ICD-10-CM | POA: Diagnosis not present

## 2014-04-25 DIAGNOSIS — E669 Obesity, unspecified: Secondary | ICD-10-CM

## 2014-04-25 DIAGNOSIS — Z9181 History of falling: Secondary | ICD-10-CM

## 2014-04-25 DIAGNOSIS — N200 Calculus of kidney: Secondary | ICD-10-CM

## 2014-04-25 DIAGNOSIS — K222 Esophageal obstruction: Secondary | ICD-10-CM

## 2014-04-25 DIAGNOSIS — Z1331 Encounter for screening for depression: Secondary | ICD-10-CM

## 2014-04-25 DIAGNOSIS — R7309 Other abnormal glucose: Secondary | ICD-10-CM | POA: Diagnosis not present

## 2014-04-25 DIAGNOSIS — Z0000 Encounter for general adult medical examination without abnormal findings: Secondary | ICD-10-CM

## 2014-04-25 DIAGNOSIS — Z125 Encounter for screening for malignant neoplasm of prostate: Secondary | ICD-10-CM

## 2014-04-25 DIAGNOSIS — E785 Hyperlipidemia, unspecified: Secondary | ICD-10-CM

## 2014-04-25 DIAGNOSIS — Z79899 Other long term (current) drug therapy: Secondary | ICD-10-CM

## 2014-04-25 DIAGNOSIS — R7303 Prediabetes: Secondary | ICD-10-CM

## 2014-04-25 DIAGNOSIS — K219 Gastro-esophageal reflux disease without esophagitis: Secondary | ICD-10-CM

## 2014-04-25 DIAGNOSIS — Z1212 Encounter for screening for malignant neoplasm of rectum: Secondary | ICD-10-CM

## 2014-04-25 LAB — CBC WITH DIFFERENTIAL/PLATELET
Basophils Absolute: 0 10*3/uL (ref 0.0–0.1)
Basophils Relative: 0 % (ref 0–1)
Eosinophils Absolute: 0.2 10*3/uL (ref 0.0–0.7)
Eosinophils Relative: 3 % (ref 0–5)
HCT: 46.7 % (ref 39.0–52.0)
Hemoglobin: 16.3 g/dL (ref 13.0–17.0)
Lymphocytes Relative: 26 % (ref 12–46)
Lymphs Abs: 2 10*3/uL (ref 0.7–4.0)
MCH: 31.7 pg (ref 26.0–34.0)
MCHC: 34.9 g/dL (ref 30.0–36.0)
MCV: 90.9 fL (ref 78.0–100.0)
MPV: 10.3 fL (ref 8.6–12.4)
Monocytes Absolute: 0.9 10*3/uL (ref 0.1–1.0)
Monocytes Relative: 12 % (ref 3–12)
Neutro Abs: 4.5 10*3/uL (ref 1.7–7.7)
Neutrophils Relative %: 59 % (ref 43–77)
Platelets: 278 10*3/uL (ref 150–400)
RBC: 5.14 MIL/uL (ref 4.22–5.81)
RDW: 13.2 % (ref 11.5–15.5)
WBC: 7.6 10*3/uL (ref 4.0–10.5)

## 2014-04-25 NOTE — Patient Instructions (Signed)
Recommend the book "The END of DIETING" by Dr Excell Seltzer   & the book "The END of DIABETES " by Dr Excell Seltzer  At [email or web address] - get book & Audio CD's      Being diabetic has a  300% increased risk for heart attack, stroke, cancer, and alzheimer- type vascular dementia. It is very important that you work harder with diet by avoiding all foods that are white. Avoid white rice (brown & wild rice is OK), white potatoes (sweetpotatoes in moderation is OK), White bread or wheat bread or anything made out of white flour like bagels, donuts, rolls, buns, biscuits, cakes, pastries, cookies, pizza crust, and pasta (made from white flour & egg whites) - vegetarian pasta or spinach or wheat pasta is OK. Multigrain breads like Arnold's or [employer], or multigrain sandwich thins or flatbreads.  Diet, exercise and weight loss can reverse and cure diabetes in the early stages.  Diet, exercise and weight loss is very important in the control and prevention of complications of diabetes which affects every system in your body, ie. Brain - dementia/stroke, eyes - glaucoma/blindness, heart - heart attack/heart failure, kidneys - dialysis, stomach - gastric paralysis, intestines - malabsorption, nerves - severe painful neuritis, circulation - gangrene & loss of a leg(s), and finally cancer and Alzheimers.    I recommend avoid fried & greasy foods,  sweets/candy, white rice (brown or wild rice or Quinoa is OK), white potatoes (sweet potatoes are OK) - anything made from white flour - bagels, doughnuts, rolls, buns, biscuits,white and wheat breads, pizza crust and traditional pasta made of white flour & egg white(vegetarian pasta or spinach or wheat pasta is OK).  Multi-grain bread is OK - like multi-grain flat bread or sandwich thins. Avoid alcohol in excess. Exercise is also important.    Eat all the vegetables you want - avoid meat, especially red meat and dairy - especially cheese.  Cheese is the most  concentrated form of trans-fats which is the worst thing to clog up our arteries. Veggie cheese is OK which can be found in the fresh produce section at [employer] or [employer] or [employer]  Preventive Care for Adults A healthy lifestyle and preventive care can promote health and wellness. Preventive health guidelines for women include the following key practices.  A routine yearly physical is a good way to check with your health care provider about your health and preventive screening. It is a chance to share any concerns and updates on your health and to receive a thorough exam.  Visit your dentist for a routine exam and preventive care every 6 months. Brush your teeth twice a day and floss once a day. Good oral hygiene prevents tooth decay and gum disease.  The frequency of eye exams is based on your age, health, family medical history, use of contact lenses, and other factors. Follow your health care provider's recommendations for frequency of eye exams.  Eat a healthy diet. Foods like vegetables, fruits, whole grains, low-fat dairy products, and lean protein foods contain the nutrients you need without too many calories. Decrease your intake of foods high in solid fats, added sugars, and salt. Eat the right amount of calories for you.Get information about a proper diet from your health care provider, if necessary.  Regular physical exercise is one of the most important things you can do for your health. Most adults should get at least 150 minutes of moderate-intensity exercise (any activity that increases your heart rate and  causes you to sweat) each week. In addition, most adults need muscle-strengthening exercises on 2 or more days a week.  Maintain a healthy weight. The body mass index (BMI) is a screening tool to identify possible weight problems. It provides an estimate of body fat based on height and weight. Your health care provider can find your BMI and can help you achieve or  maintain a healthy weight.For adults 20 years and older:  A BMI below 18.5 is considered underweight.  A BMI of 18.5 to 24.9 is normal.  A BMI of 25 to 29.9 is considered overweight.  A BMI of 30 and above is considered obese.  Maintain normal blood lipids and cholesterol levels by exercising and minimizing your intake of saturated fat. Eat a balanced diet with plenty of fruit and vegetables. If your lipid or cholesterol levels are high, you are over 50, or you are at high risk for heart disease, you may need your cholesterol levels checked more frequently.Ongoing high lipid and cholesterol levels should be treated with medicines if diet and exercise are not working.  If you smoke, find out from your health care provider how to quit. If you do not use tobacco, do not start.  Lung cancer screening is recommended for adults aged 55-80 years who are at high risk for developing lung cancer because of a history of smoking. A yearly low-dose CT scan of the lungs is recommended for people who have at least a 30-pack-year history of smoking and are a current smoker or have quit within the past 15 years. A pack year of smoking is smoking an average of 1 pack of cigarettes a day for 1 year (for example: 1 pack a day for 30 years or 2 packs a day for 15 years). Yearly screening should continue until the smoker has stopped smoking for at least 15 years. Yearly screening should be stopped for people who develop a health problem that would prevent them from having lung cancer treatment.  Avoid use of street drugs. Do not share needles with anyone. Ask for help if you need support or instructions about stopping the use of drugs.  High blood pressure causes heart disease and increases the risk of stroke.  Ongoing high blood pressure should be treated with medicines if weight loss and exercise do not work.  If you are 55-79 years old, ask your health care provider if you should take aspirin to prevent  strokes.  Diabetes screening involves taking a blood sample to check your fasting blood sugar level. This should be done once every 3 years, after age 45, if you are within normal weight and without risk factors for diabetes. Testing should be considered at a younger age or be carried out more frequently if you are overweight and have at least 1 risk factor for diabetes.  Breast cancer screening is essential preventive care for women. You should practice "breast self-awareness." This means understanding the normal appearance and feel of your breasts and may include breast self-examination. Any changes detected, no matter how small, should be reported to a health care provider. Women in their 20s and 30s should have a clinical breast exam (CBE) by a health care provider as part of a regular health exam every 1 to 3 years. After age 40, women should have a CBE every year. Starting at age 40, women should consider having a mammogram (breast X-ray test) every year. Women who have a family history of breast cancer should talk to their health   care provider about genetic screening. Women at a high risk of breast cancer should talk to their health care providers about having an MRI and a mammogram every year.  Breast cancer gene (BRCA)-related cancer risk assessment is recommended for women who have family members with BRCA-related cancers. BRCA-related cancers include breast, ovarian, tubal, and peritoneal cancers. Having family members with these cancers may be associated with an increased risk for harmful changes (mutations) in the breast cancer genes BRCA1 and BRCA2. Results of the assessment will determine the need for genetic counseling and BRCA1 and BRCA2 testing.  Routine pelvic exams to screen for cancer are no longer recommended for nonpregnant women who are considered low risk for cancer of the pelvic organs (ovaries, uterus, and vagina) and who do not have symptoms. Ask your health care provider if a  screening pelvic exam is right for you.  If you have had past treatment for cervical cancer or a condition that could lead to cancer, you need Pap tests and screening for cancer for at least 20 years after your treatment. If Pap tests have been discontinued, your risk factors (such as having a new sexual partner) need to be reassessed to determine if screening should be resumed. Some women have medical problems that increase the chance of getting cervical cancer. In these cases, your health care provider may recommend more frequent screening and Pap tests.    Colorectal cancer can be detected and often prevented. Most routine colorectal cancer screening begins at the age of 100 years and continues through age 42 years. However, your health care provider may recommend screening at an earlier age if you have risk factors for colon cancer. On a yearly basis, your health care provider may provide home test kits to check for hidden blood in the stool. Use of a small camera at the end of a tube, to directly examine the colon (sigmoidoscopy or colonoscopy), can detect the earliest forms of colorectal cancer. Talk to your health care provider about this at age 8, when routine screening begins. Direct exam of the colon should be repeated every 5-10 years through age 30 years, unless early forms of pre-cancerous polyps or small growths are found.  Osteoporosis is a disease in which the bones lose minerals and strength with aging. This can result in serious bone fractures or breaks. The risk of osteoporosis can be identified using a bone density scan. Women ages 53 years and over and women at risk for fractures or osteoporosis should discuss screening with their health care providers. Ask your health care provider whether you should take a calcium supplement or vitamin D to reduce the rate of osteoporosis.  Menopause can be associated with physical symptoms and risks. Hormone replacement therapy is available to  decrease symptoms and risks. You should talk to your health care provider about whether hormone replacement therapy is right for you.  Use sunscreen. Apply sunscreen liberally and repeatedly throughout the day. You should seek shade when your shadow is shorter than you. Protect yourself by wearing long sleeves, pants, a wide-brimmed hat, and sunglasses year round, whenever you are outdoors.  Once a month, do a whole body skin exam, using a mirror to look at the skin on your back. Tell your health care provider of new moles, moles that have irregular borders, moles that are larger than a pencil eraser, or moles that have changed in shape or color.  Stay current with required vaccines (immunizations).  Influenza vaccine. All adults should be immunized every  year.  Tetanus, diphtheria, and acellular pertussis (Td, Tdap) vaccine. Pregnant women should receive 1 dose of Tdap vaccine during each pregnancy. The dose should be obtained regardless of the length of time since the last dose. Immunization is preferred during the 27th-36th week of gestation. An adult who has not previously received Tdap or who does not know her vaccine status should receive 1 dose of Tdap. This initial dose should be followed by tetanus and diphtheria toxoids (Td) booster doses every 10 years. Adults with an unknown or incomplete history of completing a 3-dose immunization series with Td-containing vaccines should begin or complete a primary immunization series including a Tdap dose. Adults should receive a Td booster every 10 years.    Zoster vaccine. One dose is recommended for adults aged 56 years or older unless certain conditions are present.    Pneumococcal 13-valent conjugate (PCV13) vaccine. When indicated, a person who is uncertain of her immunization history and has no record of immunization should receive the PCV13 vaccine. An adult aged 32 years or older who has certain medical conditions and has not been previously  immunized should receive 1 dose of PCV13 vaccine. This PCV13 should be followed with a dose of pneumococcal polysaccharide (PPSV23) vaccine. The PPSV23 vaccine dose should be obtained at least 8 weeks after the dose of PCV13 vaccine. An adult aged 42 years or older who has certain medical conditions and previously received 1 or more doses of PPSV23 vaccine should receive 1 dose of PCV13. The PCV13 vaccine dose should be obtained 1 or more years after the last PPSV23 vaccine dose.    Pneumococcal polysaccharide (PPSV23) vaccine. When PCV13 is also indicated, PCV13 should be obtained first. All adults aged 48 years and older should be immunized. An adult younger than age 59 years who has certain medical conditions should be immunized. Any person who resides in a nursing home or long-term care facility should be immunized. An adult smoker should be immunized. People with an immunocompromised condition and certain other conditions should receive both PCV13 and PPSV23 vaccines. People with human immunodeficiency virus (HIV) infection should be immunized as soon as possible after diagnosis. Immunization during chemotherapy or radiation therapy should be avoided. Routine use of PPSV23 vaccine is not recommended for American Indians, [place] Natives, or people younger than 65 years unless there are medical conditions that require PPSV23 vaccine. When indicated, people who have unknown immunization and have no record of immunization should receive PPSV23 vaccine. One-time revaccination 5 years after the first dose of PPSV23 is recommended for people aged 19-64 years who have chronic kidney failure, nephrotic syndrome, asplenia, or immunocompromised conditions. People who received 1-2 doses of PPSV23 before age 50 years should receive another dose of PPSV23 vaccine at age 76 years or later if at least 5 years have passed since the previous dose. Doses of PPSV23 are not needed for people immunized with PPSV23 at or after  age 46 years.   Preventive Services / Frequency  Ages 82 years and over  Blood pressure check.  Lipid and cholesterol check.  Lung cancer screening. / Every year if you are aged 21-80 years and have a 30-pack-year history of smoking and currently smoke or have quit within the past 15 years. Yearly screening is stopped once you have quit smoking for at least 15 years or develop a health problem that would prevent you from having lung cancer treatment.  Clinical breast exam.** / Every year after age 41 years.  BRCA-related cancer risk assessment.** /  For women who have family members with a BRCA-related cancer (breast, ovarian, tubal, or peritoneal cancers).  Mammogram.** / Every year beginning at age 40 years and continuing for as long as you are in good health. Consult with your health care provider.  Pap test.** / Every 3 years starting at age 30 years through age 65 or 70 years with 3 consecutive normal Pap tests. Testing can be stopped between 65 and 70 years with 3 consecutive normal Pap tests and no abnormal Pap or HPV tests in the past 10 years.  Fecal occult blood test (FOBT) of stool. / Every year beginning at age 50 years and continuing until age 75 years. You may not need to do this test if you get a colonoscopy every 10 years.  Flexible sigmoidoscopy or colonoscopy.** / Every 5 years for a flexible sigmoidoscopy or every 10 years for a colonoscopy beginning at age 50 years and continuing until age 75 years.  Hepatitis C blood test.** / For all people born from 1945 through 1965 and any individual with known risks for hepatitis C.  Osteoporosis screening.** / A one-time screening for women ages 65 years and over and women at risk for fractures or osteoporosis.  Skin self-exam. / Monthly.  Influenza vaccine. / Every year.  Tetanus, diphtheria, and acellular pertussis (Tdap/Td) vaccine.** / 1 dose of Td every 10 years.  Zoster vaccine.** / 1 dose for adults aged 60 years  or older.  Pneumococcal 13-valent conjugate (PCV13) vaccine.** / Consult your health care provider.  Pneumococcal polysaccharide (PPSV23) vaccine.** / 1 dose for all adults aged 65 years and older. Screening for abdominal aortic aneurysm (AAA)  by ultrasound is recommended for people who have history of high blood pressure or who are current or former smokers. 

## 2014-04-25 NOTE — Progress Notes (Signed)
Patient ID: [name] [name], male   DOB: Oct 28, 1940, 74 y.o.   MRN: [id number]  MEDICARE ANNUAL WELLNESS VISIT AND CPE  Assessment:   1. Essential hypertension  - Microalbumin / creatinine urine ratio - EKG 12-Lead - [place], RETROPERITNL ABD,  LTD - TSH  2. Hyperlipidemia  - Lipid panel  3. Obese   4. Prediabetes  - Hemoglobin A1c - Insulin, fasting  5. Vitamin D deficiency  - Vit D  25 hydroxy (rtn osteoporosis monitoring)  6. Gastroesophageal reflux disease, esophagitis presence not specified   7. Stricture and stenosis of esophagus   8. Kidney stones   9. Screening for rectal cancer  - POC Hemoccult Bld/Stl (3-Cd Home Screen); Future  10. Prostate cancer screening   11. At low risk for fall   12. Depression screen   13. Routine general medical examination at a health care facility   14. Medication management  - Urine Microscopic - CBC with Differential/Platelet - BASIC METABOLIC PANEL WITH GFR - Hepatic function panel - Magnesium  Plan:   During the course of the visit the patient was educated and counseled about appropriate screening and preventive services including:    Pneumococcal vaccine   Influenza vaccine  Td vaccine  Screening electrocardiogram  Bone densitometry screening  Colorectal cancer screening  Diabetes screening  Glaucoma screening  Nutrition counseling   Advanced directives: requested  Screening recommendations, referrals: Vaccinations: Immunization History  Administered Date(s) Administered  . Influenza Split 12/27/2012  . Influenza, High Dose Seasonal PF 01/31/2014  . Pneumococcal Polysaccharide-23 03/02/2008  . Td 03/02/2005  . Zoster 03/02/2006  Prevnar vaccine undecided Hep B vaccine not indicated  Nutrition assessed and recommended  Colonoscopy 2008 - recc 10 yr f/u - Dr [name] Recommended yearly ophthalmology/optometry visit for glaucoma screening and checkup Recommended yearly dental visit for  hygiene and checkup Advanced directives - yes  Conditions/risks identified: BMI: Discussed weight loss, diet, and increase physical activity.  Increase physical activity: AHA recommends 150 minutes of physical activity a week.  Medications reviewed PreDiabetes is at goal, ACE/ARB therapy: Yes. Urinary Incontinence is not an issue: discussed non pharmacology and pharmacology options.  Fall risk: low- discussed PT, home fall assessment, medications.   Subjective:  [name] [name]  presents for Medicare Annual Wellness Visit and complete physical.  Date of last medicare wellness visit was 06/02/2013. This very nice 74 y.o. MWM  has been followed for HTN,  Prediabetes, Hyperlipidemia, and Vitamin D Deficiency. Patient has recently seen Dr Diona Fanti for kidney stones and had retrograde stone extraction and also reports that he had a PSA checked and a DRE.    HTN predates since  2002. Patient's BP has been controlled at home.Today's BP is 138/80 . Patient denies any cardiac symptoms as chest pain, palpitations, shortness of breath, dizziness or ankle swelling.   Patient's hyperlipidemia is controlled with diet and medications. Patient denies myalgias or other medication SE's. Last lipids were at goal - Total  Chol 173; HDL 49; LDL 90; Trig 170 on 09/12/2013.   Patient has  prediabetes since Sept 2010 with A1c 6.0% and elevated insulin 124   and patient denies reactive hypoglycemic symptoms, visual blurring, diabetic polys or paresthesias. Last A1c was  5.6% on 09/12/2013.   Finally, patient has history of Vitamin D Deficiency of 36 in 2009 and last vitamin D was  88 on  09/12/2013.  Names of Other Physician/Practitioners you currently use: 1. [facility] here for primary care 2.  Dr Katy Fitch, eye doctor, last visit 06/2012 3. Dr Zachery Dakins, [place], dentist, last visit 10/2013  Patient Care Team: Unk Pinto, MD as PCP - General (Internal Medicine) Newt Minion, MD as  Consulting Physician (Orthopedic Surgery) Inda Castle, MD as Consulting Physician (Gastroenterology) Jorja Loa, MD as Consulting Physician (Urology)  Medication Review: Medication Sig  . B Complex Vitamins (VITAMIN-B COMPLEX PO) Take 1 capsule by mouth every evening. Vegetable formula  . bisoprolol-hydrochlorothiazide (ZIAC) 5-6.25 MG per tablet TAKE 1 TABLET BY MOUTH EVERY MORNING FOR BLOOD PRESSURE  . Cholecalciferol (VITAMIN D) 2000 UNITS tablet Take 4,000-6,000 Units by mouth daily. Three capsules in the morning = 6000 units and two capsules in the evening= 4000 units for a total of 10,000 mg units.  . fenofibrate (TRICOR) 145 MG tablet Take 1 tablet daily for triglycerides / blood fats  . fish oil-omega-3 fatty acids 1000 MG capsule Take 1 g by mouth 2 (two) times daily. Total of 2400 mg daily.  . Flaxseed, Linseed, (FLAXSEED OIL PO) Take 1 capsule by mouth every morning. 1000mg   . losartan (COZAAR) 100 MG tablet Take 100 mg by mouth every morning. Takes bedtime  . Magnesium 250 MG TABS Take 250 mg by mouth every morning.   . meloxicam (MOBIC) 15 MG tablet Take 1 tablet (15 mg total) by mouth daily. (Patient taking differently: Take 15 mg by mouth every morning. )  . omeprazole (PRILOSEC) 40 MG capsule Take 1 capsule (40 mg total) by mouth daily. (Patient taking differently: Take 40 mg by mouth every morning. )   Current Problems (verified) Patient Active Problem List   Diagnosis Date Noted  . Vitamin D deficiency 06/13/2013  . Prediabetes 06/13/2013  . Medication management 06/13/2013  . Hyperlipidemia   . Hypertension   . GERD (gastroesophageal reflux disease)   . Kidney stones   . Obese   . Stricture and stenosis of esophagus 10/18/2008   Screening Tests Health Maintenance  Topic Date Due  . COLONOSCOPY  02/07/1993  . INFLUENZA VACCINE  10/01/2014  . TETANUS/TDAP  03/03/2015  . PNEUMOCOCCAL POLYSACCHARIDE VACCINE AGE 36 AND OVER  Completed  . ZOSTAVAX   Completed   Immunization History  Administered Date(s) Administered  . Influenza Split 12/27/2012  . Influenza, High Dose Seasonal PF 01/31/2014  . Pneumococcal Polysaccharide-23 03/02/2008  . Td 03/02/2005  . Zoster 03/02/2006   Preventative care: Last colonoscopy:2008 - recc 10 yr f/u by Dr [name]  History reviewed: allergies, current medications, past family history, past medical history, past social history, past surgical history and problem list  Risk Factors: Tobacco History  Substance Use Topics  . Smoking status: Former Smoker    Quit date: 03/02/1974  . Smokeless tobacco: Not on file  . Alcohol Use: 4.2 oz/week    7 Glasses of wine per week   He does not smoke.  Patient is a former smoker. Are there smokers in your home (other than you)?  No  Alcohol Current alcohol use: glass of wine with dinner, 1 liquor drinks per day(s)  Caffeine Current caffeine use: coffee 1-2 cups /day  Exercise Current exercise: cardiovascular workout on exercise equipment and walking  Nutrition/Diet Current diet: in general, a "healthy" diet    Cardiac risk factors: advanced age (older than 27 for men, 65 for women), dyslipidemia, hypertension, male gender, obesity (BMI >= 30 kg/m2), sedentary lifestyle and smoking/ tobacco exposure.  Depression Screen (Note: if answer to either of the following is "Yes", a more complete depression  screening is indicated)   Q1: Over the past two weeks, have you felt down, depressed or hopeless? No  Q2: Over the past two weeks, have you felt little interest or pleasure in doing things? No  Have you lost interest or pleasure in daily life? No  Do you often feel hopeless? No  Do you cry easily over simple problems? No  Activities of Daily Living In your present state of health, do you have any difficulty performing the following activities?:  Driving? No Managing money?  No Feeding yourself? No Getting from bed to chair? No Climbing a flight of  stairs? No Preparing food and eating?: No Bathing or showering? No Getting dressed: No Getting to the toilet? No Using the toilet:No Moving around from place to place: No In the past year have you fallen or had a near fall?:No   Are you sexually active?  Yes  Do you have more than one partner?  No  Vision Difficulties: No  Hearing Difficulties: No Do you often ask people to speak up or repeat themselves? No Do you experience ringing or noises in your ears? No Do you have difficulty understanding soft or whispered voices? No  Cognition  Do you feel that you have a problem with memory?No  Do you often misplace items? No  Do you feel safe at home?  Yes  Advanced directives Does patient have a [place]? Yes Does patient have a Living Will? Yes  Past Medical History  Diagnosis Date  . Hyperlipidemia   . Hypertension   . GERD (gastroesophageal reflux disease)   . Obese   . History of kidney stones     hx. multiple kidney stones  . Asthma     childhood yrs  only  . Arthritis     right foot   Past Surgical History  Procedure Laterality Date  . Tumor ear Left     age 71 topical growth behind left ear.  . Cystoscopy w/ ureteroscopy w/ lithotripsy    . Inguinal hernia repair      left  . Colonoscopy    . Upper gastrointestinal endoscopy    . Nephrolithotomy Left 02/01/2014    Procedure: NEPHROLITHOTOMY PERCUTANEOUS;  Surgeon: Jorja Loa, MD;  Location: [facility];  Service: Urology;  Laterality: Left;   ROS: Constitutional: Denies fever, chills, weight loss/gain, headaches, insomnia, fatigue, night sweats or change in appetite. Eyes: Denies redness, blurred vision, diplopia, discharge, itchy or watery eyes.  ENT: Denies discharge, congestion, post nasal drip, epistaxis, sore throat, earache, hearing loss, dental pain, Tinnitus, Vertigo, Sinus pain or snoring.  Cardio: Denies chest pain, palpitations, irregular heartbeat, syncope, dyspnea,  diaphoresis, orthopnea, PND, claudication or edema Respiratory: denies cough, dyspnea, DOE, pleurisy, hoarseness, laryngitis or wheezing.  Gastrointestinal: Denies dysphagia, heartburn, reflux, water brash, pain, cramps, nausea, vomiting, bloating, diarrhea, constipation, hematemesis, melena, hematochezia, jaundice or hemorrhoids Genitourinary: Denies dysuria, frequency, urgency, nocturia, hesitancy, discharge, hematuria or flank pain Musculoskeletal: Denies arthralgia, myalgia, stiffness, Jt. Swelling, pain, limp or strain/sprain. Denies Falls. Skin: Denies puritis, rash, hives, warts, acne, eczema or change in skin lesion Neuro: No weakness, tremor, incoordination, spasms, paresthesia or pain Psychiatric: Denies confusion, memory loss or sensory loss. Denies Depression. Endocrine: Denies change in weight, skin, hair change, nocturia, and paresthesia, diabetic polys, visual blurring or hyper / hypo glycemic episodes.  Heme/Lymph: No excessive bleeding, bruising or enlarged lymph nodes.  Objective:     BP 138/80   Pulse 50  Temp 98.2 F  Resp 16  Ht 5' 11.5"   Wt 247 lb     BMI 33.97   General Appearance:  Alert  WD/WN, male , in no apparent distress. Eyes: PERRLA, EOMs, conjunctiva no swelling or erythema, normal fundi and vessels. Sinuses: No frontal/maxillary tenderness ENT/Mouth: EACs patent / TMs  nl. Nares clear without erythema, swelling, mucoid exudates. Oral hygiene is good. No erythema, swelling, or exudate. Tongue normal, non-obstructing. Tonsils not swollen or erythematous. Hearing normal.  Neck: Supple, thyroid normal. No bruits, nodes or JVD. Respiratory: Respiratory effort normal.  BS equal and clear bilateral without rales, rhonci, wheezing or stridor. Cardio: Heart sounds are normal with regular rate and rhythm and no murmurs, rubs or gallops. Peripheral pulses are normal and equal bilaterally without edema. No aortic or femoral bruits. Chest: symmetric with normal  excursions and percussion.  Abdomen: Flat, soft, with nl bowel sounds. Nontender, no guarding, rebound, hernias, masses, or organomegaly.  Lymphatics: Non tender without lymphadenopathy.  Genitourinary: No hernias.Testes nl. DRE - prostate nl for age - smooth & firm w/o nodules. Musculoskeletal: Full ROM all peripheral extremities, joint stability, 5/5 strength, and normal gait. Skin: Warm and dry without rashes, lesions, cyanosis, clubbing or  ecchymosis.  Neuro: Cranial nerves intact, reflexes equal bilaterally. Normal muscle tone, no cerebellar symptoms. Sensation intact.  Pysch: Awake and oriented X 3 with normal affect, insight and judgment appropriate.   Cognitive Testing  Alert? Yes  Normal Appearance? Yes  Oriented to person? Yes  Place? Yes   Time? Yes  Recall of three objects?  Yes  Can perform simple calculations? Yes  Displays appropriate judgment? Yes  Can read the correct time from a watch/clock? Yes  Medicare Attestation I have personally reviewed: The patient's medical and social history Their use of alcohol, tobacco or illicit drugs Their current medications and supplements The patient's functional ability including ADLs,fall risks, home safety risks, cognitive, and hearing and visual impairment Diet and physical activities Evidence for depression or mood disorders  The patient's weight, height, BMI, and visual acuity have been recorded in the chart.  I have made referrals, counseling, and provided education to the patient based on review of the above and I have provided the patient with a written personalized care plan for preventive services.    Caellum Mancil DAVID, MD   04/25/2014

## 2014-04-26 LAB — LIPID PANEL
Cholesterol: 196 mg/dL (ref 0–200)
HDL: 62 mg/dL (ref >=40)
LDL Cholesterol: 109 mg/dL — ABNORMAL HIGH (ref 0–99)
Total CHOL/HDL Ratio: 3.2 Ratio
Triglycerides: 124 mg/dL (ref <150)
VLDL: 25 mg/dL (ref 0–40)

## 2014-04-26 LAB — BASIC METABOLIC PANEL WITH GFR
BUN: 23 mg/dL (ref 6–23)
CO2: 24 mEq/L (ref 19–32)
Calcium: 11.4 mg/dL — ABNORMAL HIGH (ref 8.4–10.5)
Chloride: 104 mEq/L (ref 96–112)
Creat: 1.25 mg/dL (ref 0.50–1.35)
GFR, Est African American: 67 mL/min
GFR, Est Non African American: 58 mL/min — ABNORMAL LOW
Glucose, Bld: 77 mg/dL (ref 70–99)
Potassium: 4.4 mEq/L (ref 3.5–5.3)
Sodium: 138 mEq/L (ref 135–145)

## 2014-04-26 LAB — URINALYSIS, MICROSCOPIC ONLY
Casts: NONE SEEN
Crystals: NONE SEEN
Squamous Epithelial / LPF: NONE SEEN

## 2014-04-26 LAB — HEPATIC FUNCTION PANEL
ALT: 34 U/L (ref 0–53)
AST: 33 U/L (ref 0–37)
Albumin: 4.6 g/dL (ref 3.5–5.2)
Alkaline Phosphatase: 39 U/L (ref 39–117)
Bilirubin, Direct: 0.2 mg/dL (ref 0.0–0.3)
Indirect Bilirubin: 0.8 mg/dL (ref 0.2–1.2)
Total Bilirubin: 1 mg/dL (ref 0.2–1.2)
Total Protein: 7.1 g/dL (ref 6.0–8.3)

## 2014-04-26 LAB — HEMOGLOBIN A1C
Hgb A1c MFr Bld: 6 % — ABNORMAL HIGH (ref <5.7)
Mean Plasma Glucose: 126 mg/dL — ABNORMAL HIGH (ref <117)

## 2014-04-26 LAB — VITAMIN D 25 HYDROXY (VIT D DEFICIENCY, FRACTURES): Vit D, 25-Hydroxy: 59 ng/mL (ref 30–100)

## 2014-04-26 LAB — MICROALBUMIN / CREATININE URINE RATIO
Creatinine, Urine: 110.5 mg/dL
Microalb Creat Ratio: 5.4 mg/g (ref 0.0–30.0)
Microalb, Ur: 0.6 mg/dL (ref <2.0)

## 2014-04-26 LAB — MAGNESIUM: Magnesium: 1.7 mg/dL (ref 1.5–2.5)

## 2014-04-26 LAB — INSULIN, FASTING: Insulin fasting, serum: 21.6 u[IU]/mL — ABNORMAL HIGH (ref 2.0–19.6)

## 2014-04-26 LAB — TSH: TSH: 2.175 u[IU]/mL (ref 0.350–4.500)

## 2014-04-27 ENCOUNTER — Encounter: Payer: Self-pay | Admitting: Internal Medicine

## 2014-04-27 ENCOUNTER — Ambulatory Visit: Payer: Self-pay | Admitting: Internal Medicine

## 2014-04-27 ENCOUNTER — Ambulatory Visit (INDEPENDENT_AMBULATORY_CARE_PROVIDER_SITE_OTHER): Payer: Medicare Other | Admitting: Internal Medicine

## 2014-04-27 LAB — PHOSPHORUS: Phosphorus: 2 mg/dL — ABNORMAL LOW (ref 2.3–4.6)

## 2014-04-27 NOTE — Progress Notes (Signed)
Patient ID: RYKIN ROUTE, male   DOB: 11-19-1940, 74 y.o.   MRN: [id number]   HPI  [name] [name] is a 74 y.o.-year-old male, referred by Dr. Diona Fanti (urology), for evaluation for hypercalcemia.  Pt was dx with hypercalcemia in 03/2013. I reviewed pt's pertinent labs: 03/21/2014: Ca 10.8 (8.4-10.5); iPTH 47 Lab Results  Component Value Date   CALCIUM 11.4* 04/25/2014   CALCIUM 11.2* 02/01/2014   CALCIUM 11.2* 01/19/2014   CALCIUM 11.2* 09/12/2013   CALCIUM 10.8* 06/13/2013   CALCIUM 11.1* 03/16/2013   No fractures or falls.   Pt has a h/o multiple kidney stones over the years for which he is seeing urology. He had L percutaneous nephrolithotomy in 02/01/2014 for a 4 cm L renal stone (stag[place]). He has had lithotripsy in the past.  + h/o mild CKD. Last BUN/Cr: Lab Results  Component Value Date   BUN 23 04/25/2014   CREATININE 1.25 04/25/2014   Pt is on HCTZ 6.25 mg.  Magnesium levels normal: Component     Latest Ref Rng 06/13/2013 09/12/2013 04/25/2014  Magnesium     1.5 - 2.5 mg/dL 1.7 1.8 1.7   + h/o vitamin D deficiency. He is on vitamin D 10,000 IU daily. Last vit D level was: Component     Latest Ref Rng 06/13/2013 09/12/2013 04/25/2014  Vit D, 25-Hydroxy     30 - 100 ng/mL 70 88 59   Pt is not on calcium supplements; he eats dairy and green, leafy, vegetables.  He denies constipation, has some generalized pain (unclear if from statins or exercise), no AP, but has some dysphoria.  Pt does not have a FH of hypercalcemia, pituitary tumors, thyroid cancer, or osteoporosis.   I reviewed his chart and he also has a history of prediabetes, HL, GERD.  ROS: Constitutional: + weight loss (exercise), + fatigue, no subjective hyperthermia/hypothermia, + poor sleep Eyes: no blurry vision, no xerophthalmia ENT: no sore throat, no nodules palpated in throat, no dysphagia/odynophagia, no hoarseness Cardiovascular: no CP/SOB/palpitations/leg swelling Respiratory: + morning  cough/no SOB Gastrointestinal: no N/V/D/C/+ acid reflux Musculoskeletal:+ muscle/+ joint aches Skin: no rashes Neurological: no tremors/numbness/tingling/dizziness Psychiatric: no depression/anxiety + difficulty with erections  Past Medical History  Diagnosis Date  . Hyperlipidemia   . Hypertension   . GERD (gastroesophageal reflux disease)   . Obese   . History of kidney stones     hx. multiple kidney stones  . Asthma     childhood yrs  only  . Arthritis     right foot   Past Surgical History  Procedure Laterality Date  . Tumor ear Left     age 74 topical growth behind left ear.  . Cystoscopy w/ ureteroscopy w/ lithotripsy    . Inguinal hernia repair      left  . Colonoscopy    . Upper gastrointestinal endoscopy    . Nephrolithotomy Left 02/01/2014    Procedure: NEPHROLITHOTOMY PERCUTANEOUS;  Surgeon: Jorja Loa, MD;  Location: [facility];  Service: Urology;  Laterality: Left;   History   Social History  . Marital Status: Married    Spouse Name: N/A  . Number of Children: 2   Occupational History  . Real estate agent   Social History Main Topics  . Smoking status: Former Smoker    Quit date: 03/02/1974  . Smokeless tobacco: Not on file  . Alcohol Use: 4.2 oz/week    7 Glasses of wine per week  . Drug Use: No   Current  Outpatient Prescriptions on File Prior to Visit  Medication Sig Dispense Refill  . aspirin 81 MG tablet Take 81 mg by mouth daily.    . B Complex Vitamins (VITAMIN-B COMPLEX PO) Take 1 capsule by mouth every evening. Vegetable formula    . bisoprolol-hydrochlorothiazide (ZIAC) 5-6.25 MG per tablet TAKE 1 TABLET BY MOUTH EVERY MORNING FOR BLOOD PRESSURE 90 tablet 3  . Cholecalciferol (VITAMIN D) 2000 UNITS tablet Take 4,000-6,000 Units by mouth daily. Three capsules in the morning = 6000 units and two capsules in the evening= 4000 units for a total of 10,000 mg units.    . fenofibrate (TRICOR) 145 MG tablet Take 1 tablet daily for  triglycerides / blood fats 90 tablet 99  . fish oil-omega-3 fatty acids 1000 MG capsule Take 1 g by mouth 2 (two) times daily. Total of 2400 mg daily.    . Flaxseed, Linseed, (FLAXSEED OIL PO) Take 1 capsule by mouth every morning. 1000mg     . losartan (COZAAR) 100 MG tablet Take 100 mg by mouth every morning. Takes bedtime    . Magnesium 250 MG TABS Take 250 mg by mouth every morning.     . meloxicam (MOBIC) 15 MG tablet Take 1 tablet (15 mg total) by mouth daily. (Patient taking differently: Take 15 mg by mouth every morning. ) 90 tablet 11  . omeprazole (PRILOSEC) 40 MG capsule Take 1 capsule (40 mg total) by mouth daily. (Patient taking differently: Take 40 mg by mouth every morning. ) 90 capsule 3   No current facility-administered medications on file prior to visit.   Allergies  Allergen Reactions  . Iohexol Shortness Of Breath  . Ace Inhibitors     cough   FH: - DM in sister - also see HPI  PE: BP 112/64 mmHg  Pulse 76  Temp(Src) 97.8 F (36.6 C) (Oral)  Resp 12  Ht 5\' 11"  (1.803 m)  Wt 246 lb (111.585 kg)  BMI 34.33 kg/m2  SpO2 97% Wt Readings from Last 3 Encounters:  04/27/14 246 lb (111.585 kg)  04/25/14 247 lb (112.038 kg)  02/01/14 236 lb 12.4 oz (107.4 kg)   Constitutional: overweight, in NAD. No kyphosis. Eyes: PERRLA, EOMI, no exophthalmos ENT: moist mucous membranes, no thyromegaly, no cervical lymphadenopathy Cardiovascular: RRR, No MRG Respiratory: CTA B Gastrointestinal: abdomen soft, NT, ND, BS+ Musculoskeletal: no deformities, strength intact in all 4 Skin: moist, warm, no rashes Neurological: no tremor with outstretched hands, DTR normal in all 4  Assessment: 1. Hypercalcemia/hyperparathyroidism  Plan: Patient has had several instances of elevated calcium, with the highest level being at 11.4. An intact PTH level was not suppressed, at 47 (however, associated calcium was 10.8).  No vitamin D deficiency. No apparent complications from  hypercalcemia other than nephrolithiasis: no osteoporosis, no fractures. No abdominal pain, depression. - I discussed with the patient about the physiology of calcium and parathyroid hormone, and possible side effects from increased PTH, including kidney stones, osteoporosis, abdominal pain, etc.  - We discussed that we need to check whether his hyperparathyroidism is primary (Familial hypercalcemic hypocalciuria or parathyroid adenoma) or secondary (to conditions like: vitamin D deficiency, calcium malabsorption, hypercalciuria, renal insufficiency, etc.). - If he has a parathyroid adenoma, I will sugest surgery >> he agrees.  - We discussed possible consequences of hyperparathyroidism: ~1/3 pts will develop complications over 15 years (OP, nephrolithiasis - he already has this).  - criteria for parathyroid surgery are:  Increased calcium by more than 1 mg/dL above the upper  limit of normal  Kidney ds.  Osteoporosis (or Vb fx) Age <55 years old New (2013): High UCa >400 mg/d and increased stone risk by biochemical stone risk analysis Presence of nephrolithiasis or nephrocalcinosis Pt's preference!  - he does meet the criteria (nephrolithiasis). - I will also check: calcium level intact PTH ([facility]) Phosphorus vitamin D 1,25 HO  24h urinary calcium/creatinine ratio - pt given instructions for urine collection and the jug - of note, he is on a low dose of HCTZ - I will not stop this since it can also help with his kidney stones, and I doubt this will have a major effect on his tests - I will see the patient back in 4 months, I will discuss through my chart or by phone  - time spent with the patient: 1 hour, of which >50% was spent in obtaining information about his symptoms, reviewing hisw previous labs, evaluations, and treatments, counseling her about his condition (please see the discussed topics above), and developing a plan to further investigate it.   Office Visit on 04/27/2014   Component Date Value Ref Range Status  . Phosphorus 04/27/2014 2.0* 2.3 - 4.6 mg/dL Final  . Calcium 04/27/2014 11.6* 8.6 - 10.2 mg/dL Final  . PTH 04/27/2014 23  15 - 65 pg/mL Final  . PTH 04/27/2014 Comment   Final   Comment: Interpretation                 Intact PTH    Calcium                                 (pg/mL)      (mg/dL) Normal                          15 - 65     8.6 - 10.2 Primary Hyperparathyroidism         >65          >10.2 Secondary Hyperparathyroidism       >65          <10.2 Non-Parathyroid Hypercalcemia       <65          >10.2 Hypoparathyroidism                  <15          < 8.6 Non-Parathyroid Hypocalcemia    15 - 65          < 8.6    Component     Latest Ref Rng 04/30/2014  Calcium, Ur      22  Calcium, 24 hour urine     100 - 250 mg/day 352 (H)  Creatinine, Urine      122.6  Creatinine, 24H Ur     800 - 2000 mg/day 1961   High serum and urinary calcium with inappropriately normal PTH. Low phosphorus. I believe he has primary hyperparathyroidism and will refer to surgery.

## 2014-04-27 NOTE — Patient Instructions (Signed)
Please stop at the lab.  Patient information (Up-to-Date): Collection of a 24-hour urine specimen   - You should collect every drop of urine during each 24-hour period. It does not matter how much or little urine is passed each time, as long as every drop is collected. - Begin the urine collection in the morning after you wake up, after you have emptied your bladder for the first time. - Urinate (empty the bladder) for the first time and flush it down the toilet. Note the exact time (eg, 6:15 AM). You will begin the urine collection at this time. - Collect every drop of urine during the day and night in an empty collection bottle. Store the bottle at room temperature or in the refrigerator. - If you need to have a bowel movement, any urine passed with the bowel movement should be collected. Try not to include feces with the urine collection. If feces does get mixed in, do not try to remove the feces from the urine collection bottle. - Finish by collecting the first urine passed the next morning, adding it to the collection bottle. This should be within ten minutes before or after the time of the first morning void on the first day (which was flushed). In this example, you would try to void between 6:05 and 6:25 on the second day. - If you need to urinate one hour before the final collection time, drink a full glass of water so that you can void again at the appropriate time. If you have to urinate 20 minutes before, try to hold the urine until the proper time. - Please note the exact time of the final collection, even if it is not the same time as when collection began on day 1. - The bottle(s) may be kept at room temperature for a day or two, but should be kept cool or refrigerated for longer periods of time.  Please come back for a follow-up appointment in 4 months.

## 2014-04-28 LAB — PTH, INTACT AND CALCIUM
Calcium: 11.6 mg/dL — ABNORMAL HIGH (ref 8.6–10.2)
PTH: 23 pg/mL (ref 15–65)

## 2014-04-30 ENCOUNTER — Other Ambulatory Visit: Payer: Self-pay

## 2014-05-01 LAB — CALCIUM, URINE, 24 HOUR
Calcium, 24 hour urine: 352 mg/d — ABNORMAL HIGH (ref 100–250)
Calcium, Ur: 22 mg/dL

## 2014-05-01 LAB — CREATININE, URINE, 24 HOUR
Creatinine, 24H Ur: 1961 mg/d (ref 800–2000)
Creatinine, Urine: 122.6 mg/dL

## 2014-05-03 LAB — VITAMIN D 1,25 DIHYDROXY
Vitamin D 1, 25 (OH)2 Total: 67 pg/mL (ref 18–72)
Vitamin D2 1, 25 (OH)2: <8 pg/mL
Vitamin D3 1, 25 (OH)2: 67 pg/mL

## 2014-05-04 DIAGNOSIS — L821 Other seborrheic keratosis: Secondary | ICD-10-CM | POA: Diagnosis not present

## 2014-05-04 DIAGNOSIS — Z85828 Personal history of other malignant neoplasm of skin: Secondary | ICD-10-CM | POA: Diagnosis not present

## 2014-05-04 DIAGNOSIS — D485 Neoplasm of uncertain behavior of skin: Secondary | ICD-10-CM | POA: Diagnosis not present

## 2014-05-04 DIAGNOSIS — L918 Other hypertrophic disorders of the skin: Secondary | ICD-10-CM | POA: Diagnosis not present

## 2014-05-04 DIAGNOSIS — L570 Actinic keratosis: Secondary | ICD-10-CM | POA: Diagnosis not present

## 2014-05-04 DIAGNOSIS — C44619 Basal cell carcinoma of skin of left upper limb, including shoulder: Secondary | ICD-10-CM | POA: Diagnosis not present

## 2014-05-18 ENCOUNTER — Other Ambulatory Visit: Payer: Self-pay | Admitting: Surgery

## 2014-05-18 DIAGNOSIS — E210 Primary hyperparathyroidism: Secondary | ICD-10-CM | POA: Diagnosis not present

## 2014-05-20 ENCOUNTER — Encounter: Payer: Self-pay | Admitting: *Deleted

## 2014-05-21 ENCOUNTER — Other Ambulatory Visit: Payer: Self-pay | Admitting: Surgery

## 2014-05-22 ENCOUNTER — Other Ambulatory Visit: Payer: Self-pay | Admitting: Surgery

## 2014-06-05 ENCOUNTER — Encounter (HOSPITAL_COMMUNITY)
Admission: RE | Admit: 2014-06-05 | Discharge: 2014-06-05 | Disposition: A | Discharge status: Home / Self Care | Payer: Medicare Other | Source: Ambulatory Visit | Attending: Surgery | Admitting: Surgery

## 2014-06-05 ENCOUNTER — Ambulatory Visit (HOSPITAL_COMMUNITY)
Admission: RE | Admit: 2014-06-05 | Discharge: 2014-06-05 | Disposition: A | Discharge status: Home / Self Care | Payer: Medicare Other | Source: Ambulatory Visit | Attending: Surgery | Admitting: Surgery

## 2014-06-05 DIAGNOSIS — D351 Benign neoplasm of parathyroid gland: Secondary | ICD-10-CM | POA: Diagnosis not present

## 2014-06-05 MED ORDER — TECHNETIUM TC 99M SESTAMIBI GENERIC - CARDIOLITE: 25.0000 | Freq: Once | INTRAVENOUS | Status: AC | PRN

## 2014-06-07 ENCOUNTER — Other Ambulatory Visit (INDEPENDENT_AMBULATORY_CARE_PROVIDER_SITE_OTHER): Payer: Medicare Other

## 2014-06-07 DIAGNOSIS — Z1212 Encounter for screening for malignant neoplasm of rectum: Secondary | ICD-10-CM | POA: Diagnosis not present

## 2014-06-07 LAB — POC HEMOCCULT BLD/STL (HOME/3-CARD/SCREEN)
Card #2 Fecal Occult Blod, POC: NEGATIVE
Card #3 Fecal Occult Blood, POC: NEGATIVE
Fecal Occult Blood, POC: NEGATIVE

## 2014-06-11 ENCOUNTER — Ambulatory Visit: Payer: Self-pay | Admitting: Surgery

## 2014-06-14 ENCOUNTER — Encounter: Payer: Self-pay | Admitting: Internal Medicine

## 2014-06-19 DIAGNOSIS — B351 Tinea unguium: Secondary | ICD-10-CM | POA: Diagnosis not present

## 2014-07-02 ENCOUNTER — Encounter: Payer: Self-pay | Admitting: Internal Medicine

## 2014-07-02 ENCOUNTER — Ambulatory Visit (INDEPENDENT_AMBULATORY_CARE_PROVIDER_SITE_OTHER): Payer: Medicare Other | Admitting: Internal Medicine

## 2014-07-02 VITALS — BP 138/74 | HR 64 | Temp 98.0°F | Resp 18 | Ht 71.5 in | Wt 248.0 lb

## 2014-07-02 DIAGNOSIS — J069 Acute upper respiratory infection, unspecified: Secondary | ICD-10-CM | POA: Diagnosis not present

## 2014-07-02 MED ORDER — BENZONATATE 100 MG PO CAPS: 100.0000 mg | ORAL_CAPSULE | Freq: Four times a day (QID) | ORAL | Status: DC | PRN

## 2014-07-02 MED ORDER — AZITHROMYCIN 250 MG PO TABS: ORAL_TABLET | ORAL | Status: DC

## 2014-07-02 MED ORDER — PREDNISONE 20 MG PO TABS: ORAL_TABLET | ORAL | Status: DC

## 2014-07-02 NOTE — Progress Notes (Signed)
Patient ID: [name] [name], male   DOB: 12-29-1940, 74 y.o.   MRN: [id number]  HPI  Patient presents to the office for evaluation of cough.  It has been going on for 4 days.  Patient reports night > day, no apparent relationship to exercise, worse with lying down.  They also endorse change in voice, postnasal drip, wheezing and nasal congestion, rhinorrhea, sore throat, itchy watery eyes and sneezing.  .  They have tried antitussives or mucinex.  They report that nothing has worked.  They denies other sick contacts.  Review of Systems  Constitutional: Negative for fever, chills and malaise/fatigue.  HENT: Positive for congestion and sore throat. Negative for ear pain.   Respiratory: Positive for cough and wheezing. Negative for sputum production and shortness of breath.   Cardiovascular: Negative for chest pain, palpitations and leg swelling.  Neurological: Negative for headaches.    PE:  General:  Alert and non-toxic, WDWN, NAD HEENT: NCAT, PERLA, EOM normal, no occular discharge or erythema.  Nasal mucosal edema with sinus tenderness to palpation.  Oropharynx clear with minimal oropharyngeal edema and erythema.  Mucous membranes moist and pink. Neck:  Cervical adenopathy Chest:  RRR no MRGs.  Lungs clear to auscultation A&P with no wheezes rhonchi or rales.  Abdomen: +BS x 4 quadrants, soft, non-tender, no guarding, rigidity, or rebound. Skin: warm and dry no rash Neuro: A&Ox4, CN II-XII grossly intact  Filed Vitals:   07/02/14 1418  BP: 138/74  Pulse: 64  Temp: 98 F (36.7 C)  Resp: 18   Assessment and Plan:   1. Acute URI Likely allergic rhinitis vs. Viral uri.  Less likely bacterial infection.  Given upcoming surgery will give  Zpak.  Tessalon to help with coughing.  Suggesting claritin/zyrtec and nasal saline.  Prednisone given.  Will route note to surgeon.  Doubt CAP or bacterial infection given exam and vitals.    - benzonatate (TESSALON PERLES) 100 MG capsule; Take 1  capsule (100 mg total) by mouth every 6 (six) hours as needed for cough.  Dispense: 30 capsule; Refill: 1 - azithromycin (ZITHROMAX Z-PAK) 250 MG tablet; 2 po day one, then 1 daily x 4 days  Dispense: 5 tablet; Refill: 0 - predniSONE (DELTASONE) 20 MG tablet; 3 tabs po day one, then 2 tabs daily x 4 days  Dispense: 11 tablet; Refill: 0

## 2014-07-02 NOTE — Patient Instructions (Signed)

## 2014-07-03 ENCOUNTER — Encounter (HOSPITAL_COMMUNITY)
Admission: RE | Admit: 2014-07-03 | Discharge: 2014-07-03 | Disposition: A | Discharge status: Home / Self Care | Payer: Medicare Other | Source: Ambulatory Visit | Attending: Surgery | Admitting: Surgery

## 2014-07-03 ENCOUNTER — Encounter (HOSPITAL_COMMUNITY): Payer: Self-pay

## 2014-07-03 DIAGNOSIS — J449 Chronic obstructive pulmonary disease, unspecified: Secondary | ICD-10-CM | POA: Diagnosis not present

## 2014-07-03 DIAGNOSIS — Z87442 Personal history of urinary calculi: Secondary | ICD-10-CM | POA: Diagnosis not present

## 2014-07-03 DIAGNOSIS — K219 Gastro-esophageal reflux disease without esophagitis: Secondary | ICD-10-CM | POA: Diagnosis not present

## 2014-07-03 DIAGNOSIS — D351 Benign neoplasm of parathyroid gland: Secondary | ICD-10-CM | POA: Diagnosis not present

## 2014-07-03 DIAGNOSIS — E785 Hyperlipidemia, unspecified: Secondary | ICD-10-CM | POA: Diagnosis not present

## 2014-07-03 DIAGNOSIS — I10 Essential (primary) hypertension: Secondary | ICD-10-CM | POA: Diagnosis not present

## 2014-07-03 DIAGNOSIS — E669 Obesity, unspecified: Secondary | ICD-10-CM | POA: Diagnosis not present

## 2014-07-03 DIAGNOSIS — Z888 Allergy status to other drugs, medicaments and biological substances status: Secondary | ICD-10-CM | POA: Diagnosis not present

## 2014-07-03 DIAGNOSIS — E210 Primary hyperparathyroidism: Secondary | ICD-10-CM | POA: Diagnosis not present

## 2014-07-03 DIAGNOSIS — Z87891 Personal history of nicotine dependence: Secondary | ICD-10-CM | POA: Diagnosis not present

## 2014-07-03 DIAGNOSIS — Z7982 Long term (current) use of aspirin: Secondary | ICD-10-CM | POA: Diagnosis not present

## 2014-07-03 DIAGNOSIS — M1990 Unspecified osteoarthritis, unspecified site: Secondary | ICD-10-CM | POA: Diagnosis not present

## 2014-07-03 DIAGNOSIS — Z6833 Body mass index (BMI) 33.0-33.9, adult: Secondary | ICD-10-CM | POA: Diagnosis not present

## 2014-07-03 LAB — BASIC METABOLIC PANEL
Anion gap: 11 (ref 5–15)
BUN: 21 mg/dL — ABNORMAL HIGH (ref 6–20)
CO2: 24 mmol/L (ref 22–32)
Calcium: 11.4 mg/dL — ABNORMAL HIGH (ref 8.9–10.3)
Chloride: 99 mmol/L — ABNORMAL LOW (ref 101–111)
Creatinine, Ser: 1.42 mg/dL — ABNORMAL HIGH (ref 0.61–1.24)
GFR calc Af Amer: 56 mL/min — ABNORMAL LOW (ref >60)
GFR calc non Af Amer: 48 mL/min — ABNORMAL LOW (ref >60)
Glucose, Bld: 127 mg/dL — ABNORMAL HIGH (ref 70–99)
Potassium: 4 mmol/L (ref 3.5–5.1)
Sodium: 134 mmol/L — ABNORMAL LOW (ref 135–145)

## 2014-07-03 LAB — CBC
HCT: 46 % (ref 39.0–52.0)
Hemoglobin: 16.3 g/dL (ref 13.0–17.0)
MCH: 32.5 pg (ref 26.0–34.0)
MCHC: 35.4 g/dL (ref 30.0–36.0)
MCV: 91.6 fL (ref 78.0–100.0)
Platelets: 247 10*3/uL (ref 150–400)
RBC: 5.02 MIL/uL (ref 4.22–5.81)
RDW: 13 % (ref 11.5–15.5)
WBC: 13.9 10*3/uL — ABNORMAL HIGH (ref 4.0–10.5)

## 2014-07-03 NOTE — Progress Notes (Signed)
   07/03/14 1019  OBSTRUCTIVE SLEEP APNEA  Have you ever been diagnosed with sleep apnea through a sleep study? No  Do you snore loudly (loud enough to be heard through closed doors)?  0  Do you often feel tired, fatigued, or sleepy during the daytime? 0  Has anyone observed you stop breathing during your sleep? 0  Do you have, or are you being treated for high blood pressure? 1  BMI more than 35 kg/m2? 0  Age over 72 years old? 1  Neck circumference greater than 40 cm/16 inches? 1 (18)  Gender: 1

## 2014-07-03 NOTE — Progress Notes (Signed)
STOP-Bang score 4; results faxed to Dr Melford Aase

## 2014-07-03 NOTE — Progress Notes (Signed)
Quick Note:  These results are acceptable for scheduled surgery.  Sahana Boyland M. Mateusz Neilan, MD, FACS [facility] Office: [phone]   ______ 

## 2014-07-03 NOTE — Pre-Procedure Instructions (Signed)
[name] [name]  07/03/2014   Your procedure is scheduled on:  Tuesday, May 10 @ 0930 AM  Report to [facility] Admitting at 0730 AM.  Call this number if you have problems the morning of surgery: [phone]   Remember:   Do not take aspirin, ibuprofen, Mobic,   Herbal supplements or teas, fish oil 7 days before surgery.                     Do not eat food or drink liquids after midnight.    Take these medicines the morning of surgery with A SIP OF WATER: omeprazole (Prilosec), Ziac   Do not wear jewelry.  Do not wear lotions, powders, or perfumes. You may not wear deodorant.  Do not shave 48 hours prior to surgery. Men may shave face and neck.  Do not bring valuables to the hospital.  [facility] is not responsible   for any belongings or valuables.               Contacts, dentures or bridgework may not be worn into surgery.  Leave suitcase in the car. After surgery it may be brought to your room.  For patients admitted to the hospital, discharge time is determined by your    treatment team.               Patients discharged the day of surgery will not be allowed to drive  home.     Special Instructions: [place] - Preparing for Surgery  Before surgery, you can play an important role.  Because skin is not sterile, your skin needs to be as free of germs as possible.  You can reduce the number of germs on you skin by washing with CHG (chlorahexidine gluconate) soap before surgery.  CHG is an antiseptic cleaner which kills germs and bonds with the skin to continue killing germs even after washing.  Please DO NOT use if you have an allergy to CHG or antibacterial soaps.  If your skin becomes reddened/irritated stop using the CHG and inform your nurse when you arrive at Short Stay.  Do not shave (including legs and underarms) for at least 48 hours prior to the first CHG shower.  You may shave your face.  Please follow these instructions carefully:   1.  Shower with CHG  Soap the night before surgery and the    morning of Surgery.  2.  If you choose to wash your hair, wash your hair first as usual with your    normal shampoo.  3.  After you shampoo, rinse your hair and body thoroughly to remove the  Shampoo.  4.  Use CHG as you would any other liquid soap.  You can apply chg directly   to the skin and wash gently with scrungie or a clean washcloth.  5.  Apply the CHG Soap to your body ONLY FROM THE NECK DOWN.    Do not use on open wounds or open sores.  Avoid contact with your eyes,   ears, mouth and genitals (private parts).  Wash genitals (private parts)   with your normal soap.  6.  Wash thoroughly, paying special attention to the area where your surgery will be performed.  7.  Thoroughly rinse your body with warm water from the neck down.  8.  DO NOT shower/wash with your normal soap after using and rinsing off   the CHG Soap.  9.  Pat yourself dry  with a clean towel.            10.  Wear clean pajamas.            11.  Place clean sheets on your bed the night of your first shower and do not sleep with pets.  Day of Surgery  Do not apply any lotions/deoderants the morning of surgery.  Please wear clean clothes to the hospital/surgery center.     Please read over the following fact sheets that you were given: Pain Booklet, Coughing and Deep Breathing and Surgical Site Infection Prevention

## 2014-07-09 ENCOUNTER — Encounter (HOSPITAL_COMMUNITY): Payer: Self-pay | Admitting: Surgery

## 2014-07-09 DIAGNOSIS — E210 Primary hyperparathyroidism: Secondary | ICD-10-CM | POA: Diagnosis present

## 2014-07-09 MED ORDER — CEFAZOLIN SODIUM-DEXTROSE 2-3 GM-% IV SOLR
2.0000 g | INTRAVENOUS | Status: AC
  Administered 2014-07-10: 2 g via INTRAVENOUS
  Filled 2014-07-09: qty 50

## 2014-07-09 NOTE — H&P (Signed)
General Surgery [facility]  [name] [name]. [name] [name] DOB: 1940-07-06 Married / Language: English / Race: White Male  History of Present Illness Patient words: hypercalcemia.  The patient is a 74 year old male who presents with a parathyroid neoplasm. Patient is referred by Dr. Philemon [name] for evaluation of suspected primary hyperparathyroidism. Patient had been noted with hypercalcemia. He has a long history of nephrolithiasis. He was recently referred by his urologist, Dr. Preston [name], to endocrinology for evaluation. Laboratory studies show elevated serum calcium levels ranging from 10.8-11.4. Intact PTH level is inappropriately normal at 47. 25-hydroxy vitamin D level was normal at 59. 24-hour urine collection for calcium was moderately elevated at 352. Patient is now referred for evaluation for possible primary hyperparathyroidism. Patient has had no prior surgery on the head or neck. He does note chronic fatigue. He has had multiple kidney stones. There is no family history of parathyroid disease and no history of other endocrine neoplasms.   Other Problems  High blood pressure Hypercholesterolemia Inguinal Hernia Kidney Stone  Past Surgical History  Colon Polyp Removal - Colonoscopy Colon Polyp Removal - Open Oral Surgery Ventral / Umbilical Hernia Surgery Left.  Diagnostic Studies History Colonoscopy 5-10 years ago  Allergies Iohexol *DIAGNOSTIC PRODUCTS* ACE Inhibitors  Medication History Bisoprolol-Hydrochlorothiazide (5-6.25MG  Tablet, Oral) Active. Fenofibrate Micronized (134MG  Capsule, Oral) Active. Losartan Potassium (100MG  Tablet, Oral) Active. Meloxicam (15MG  Tablet, Oral) Active. Omeprazole (40MG  Capsule DR, Oral) Active. Complete Allergy Medicine (25MG  Capsule, Oral) Active. Flax Seed Oil (1000MG  Capsule, Oral) Active. Fish Oil Active. Terbinafine HCl (250MG  Tablet, Oral) Active. Medications  Reconciled  Social History  Alcohol use Occasional alcohol use. Caffeine use Coffee, Tea. No drug use Tobacco use Former smoker.  Family History Diabetes Mellitus Sister. Heart Disease Father. Hypertension Father. Prostate Cancer Father.  Review of Systems General Not Present- Appetite Loss, Chills, Fatigue, Fever, Night Sweats, Weight Gain and Weight Loss. Skin Not Present- Change in Wart/Mole, Dryness, Hives, Jaundice, New Lesions, Non-Healing Wounds, Rash and Ulcer. HEENT Present- Wears glasses/contact lenses. Not Present- Earache, Hearing Loss, Hoarseness, Nose Bleed, Oral Ulcers, Ringing in the Ears, Seasonal Allergies, Sinus Pain, Sore Throat, Visual Disturbances and Yellow Eyes. Respiratory Not Present- Bloody sputum, Chronic Cough, Difficulty Breathing, Snoring and Wheezing. Breast Present- Breast Pain. Not Present- Breast Mass, Nipple Discharge and Skin Changes. Cardiovascular Not Present- Chest Pain, Difficulty Breathing Lying Down, Leg Cramps, Palpitations, Rapid Heart Rate, Shortness of Breath and Swelling of Extremities. Gastrointestinal Not Present- Abdominal Pain, Bloating, Bloody Stool, Change in Bowel Habits, Chronic diarrhea, Constipation, Difficulty Swallowing, Excessive gas, Gets full quickly at meals, Hemorrhoids, Indigestion, Nausea, Rectal Pain and Vomiting. Male Genitourinary Present- Impotence and Nocturia. Not Present- Blood in Urine, Change in Urinary Stream, Frequency, Painful Urination, Urgency and Urine Leakage. Musculoskeletal Present- Joint Pain. Not Present- Back Pain, Joint Stiffness, Muscle Pain, Muscle Weakness and Swelling of Extremities. Neurological Not Present- Decreased Memory, Fainting, Headaches, Numbness, Seizures, Tingling, Tremor, Trouble walking and Weakness. Psychiatric Present- Change in Sleep Pattern. Not Present- Anxiety, Bipolar, Depression, Fearful and Frequent crying. Endocrine Not Present- Cold Intolerance, Excessive Hunger,  Hair Changes, Heat Intolerance, Hot flashes and New Diabetes. Hematology Not Present- Easy Bruising, Excessive bleeding, Gland problems, HIV and Persistent Infections.   Vitals Weight: 241 lb Height: 70in Body Surface Area: 2.32 m Body Mass Index: 34.58 kg/m Temp.: 97.41F(Temporal)  Pulse: 77 (Regular)  BP: 122/80 (Sitting, Left Arm, Standard)    Physical Exam  General - appears comfortable, no distress; not diaphorectic  HEENT -  normocephalic; sclerae clear, gaze conjugate; mucous membranes moist, dentition good; voice normal  Neck - symmetric on extension; no palpable anterior or posterior cervical adenopathy; no palpable masses in the thyroid bed  Chest - clear bilaterally with rhonchi, rales, or wheeze  Cor - regular rhythm with normal rate; no significant murmur  Ext - non-tender without significant edema or lymphedema  Neuro - grossly intact; no tremor    Assessment & Plan  PRIMARY HYPERPARATHYROIDISM (252.01  E21.0)  Patient has hypercalcemia worrisome for primary hyperparathyroidism. I provided him with written literature on parathyroid disease to review at home.  I have recommended proceeding with nuclear medicine parathyroid scan in hopes of identifying a parathyroid adenoma and confirming his diagnosis of primary hyperparathyroidism. The patient and I have discussed this diagnosis at length. We've discussed the scan. There is approximately 80% chance of successful identification of a parathyroid adenoma. If the scan is negative, we will consider high resolution ultrasound examination of the neck.  If the patient has a parathyroid adenoma identified, I believe he would be a good candidate for minimally invasive surgery. We have discussed the procedure. We have discussed the likelihood of success. We have discussed the 5% possibility of a second gland adenoma. He understands and wishes to proceed with further evaluation.  Nuclear med parathyroid scan  localizes an adenoma to the upper left position.  Recommend minimally invasive parathyroidectomy.  The risks and benefits of the procedure have been discussed at length with the patient.  The patient understands the proposed procedure, potential alternative treatments, and the course of recovery to be expected.  All of the patient's questions have been answered at this time.  The patient wishes to proceed with surgery.  Earnstine Regal, MD, [facility] Office: [phone]

## 2014-07-10 ENCOUNTER — Encounter (HOSPITAL_COMMUNITY): Payer: Self-pay | Admitting: *Deleted

## 2014-07-10 ENCOUNTER — Ambulatory Visit (HOSPITAL_COMMUNITY): Payer: Medicare Other | Admitting: Anesthesiology

## 2014-07-10 ENCOUNTER — Ambulatory Visit (HOSPITAL_COMMUNITY)
Admission: RE | Admit: 2014-07-10 | Discharge: 2014-07-10 | Disposition: A | Discharge status: Home / Self Care | Payer: Medicare Other | Source: Ambulatory Visit | Attending: Surgery | Admitting: Surgery

## 2014-07-10 ENCOUNTER — Encounter (HOSPITAL_COMMUNITY)
Admission: RE | Disposition: A | Discharge status: Home / Self Care | Payer: Self-pay | Source: Ambulatory Visit | Attending: Surgery

## 2014-07-10 DIAGNOSIS — M1990 Unspecified osteoarthritis, unspecified site: Secondary | ICD-10-CM | POA: Diagnosis not present

## 2014-07-10 DIAGNOSIS — E785 Hyperlipidemia, unspecified: Secondary | ICD-10-CM | POA: Diagnosis not present

## 2014-07-10 DIAGNOSIS — E669 Obesity, unspecified: Secondary | ICD-10-CM | POA: Diagnosis not present

## 2014-07-10 DIAGNOSIS — D351 Benign neoplasm of parathyroid gland: Secondary | ICD-10-CM | POA: Diagnosis not present

## 2014-07-10 DIAGNOSIS — J449 Chronic obstructive pulmonary disease, unspecified: Secondary | ICD-10-CM | POA: Diagnosis not present

## 2014-07-10 DIAGNOSIS — K219 Gastro-esophageal reflux disease without esophagitis: Secondary | ICD-10-CM | POA: Insufficient documentation

## 2014-07-10 DIAGNOSIS — E210 Primary hyperparathyroidism: Secondary | ICD-10-CM | POA: Diagnosis not present

## 2014-07-10 DIAGNOSIS — Z87891 Personal history of nicotine dependence: Secondary | ICD-10-CM | POA: Insufficient documentation

## 2014-07-10 DIAGNOSIS — Z7982 Long term (current) use of aspirin: Secondary | ICD-10-CM | POA: Diagnosis not present

## 2014-07-10 DIAGNOSIS — I10 Essential (primary) hypertension: Secondary | ICD-10-CM | POA: Diagnosis not present

## 2014-07-10 DIAGNOSIS — Z888 Allergy status to other drugs, medicaments and biological substances status: Secondary | ICD-10-CM | POA: Insufficient documentation

## 2014-07-10 DIAGNOSIS — Z87442 Personal history of urinary calculi: Secondary | ICD-10-CM | POA: Insufficient documentation

## 2014-07-10 DIAGNOSIS — Z6833 Body mass index (BMI) 33.0-33.9, adult: Secondary | ICD-10-CM | POA: Insufficient documentation

## 2014-07-10 HISTORY — PX: PARATHYROIDECTOMY: SHX19

## 2014-07-10 SURGERY — PARATHYROIDECTOMY
Anesthesia: General | Site: Neck | Laterality: Left

## 2014-07-10 MED ORDER — OXYCODONE HCL 5 MG PO TABS: 5.0000 mg | ORAL_TABLET | Freq: Once | ORAL | Status: DC | PRN

## 2014-07-10 MED ORDER — EPHEDRINE SULFATE 50 MG/ML IJ SOLN
INTRAMUSCULAR | Status: DC | PRN
  Administered 2014-07-10 (×3): 10 mg via INTRAVENOUS

## 2014-07-10 MED ORDER — FENTANYL CITRATE (PF) 250 MCG/5ML IJ SOLN
INTRAMUSCULAR | Status: AC
  Filled 2014-07-10: qty 5

## 2014-07-10 MED ORDER — BUPIVACAINE HCL (PF) 0.25 % IJ SOLN
INTRAMUSCULAR | Status: AC
  Filled 2014-07-10: qty 30

## 2014-07-10 MED ORDER — NEOSTIGMINE METHYLSULFATE 10 MG/10ML IV SOLN
INTRAVENOUS | Status: DC | PRN
  Administered 2014-07-10: 3 mg via INTRAVENOUS

## 2014-07-10 MED ORDER — FENTANYL CITRATE (PF) 100 MCG/2ML IJ SOLN
25.0000 ug | INTRAMUSCULAR | Status: DC | PRN
  Administered 2014-07-10: 50 ug via INTRAVENOUS

## 2014-07-10 MED ORDER — FENTANYL CITRATE (PF) 100 MCG/2ML IJ SOLN
INTRAMUSCULAR | Status: AC
  Filled 2014-07-10: qty 2

## 2014-07-10 MED ORDER — ONDANSETRON HCL 4 MG/2ML IJ SOLN
INTRAMUSCULAR | Status: AC
  Filled 2014-07-10: qty 2

## 2014-07-10 MED ORDER — ROCURONIUM BROMIDE 100 MG/10ML IV SOLN
INTRAVENOUS | Status: DC | PRN
  Administered 2014-07-10: 40 mg via INTRAVENOUS

## 2014-07-10 MED ORDER — OXYCODONE HCL 5 MG PO TABS: 5.0000 mg | ORAL_TABLET | ORAL | Status: DC | PRN

## 2014-07-10 MED ORDER — 0.9 % SODIUM CHLORIDE (POUR BTL) OPTIME
TOPICAL | Status: DC | PRN
  Administered 2014-07-10: 1000 mL

## 2014-07-10 MED ORDER — SODIUM CHLORIDE 0.9 % IV SOLN
10.0000 mg | INTRAVENOUS | Status: DC | PRN
  Administered 2014-07-10: 10 ug/min via INTRAVENOUS

## 2014-07-10 MED ORDER — LIDOCAINE HCL (CARDIAC) 20 MG/ML IV SOLN
INTRAVENOUS | Status: AC
  Filled 2014-07-10: qty 5

## 2014-07-10 MED ORDER — ROCURONIUM BROMIDE 50 MG/5ML IV SOLN
INTRAVENOUS | Status: AC
  Filled 2014-07-10: qty 1

## 2014-07-10 MED ORDER — MIDAZOLAM HCL 2 MG/2ML IJ SOLN
INTRAMUSCULAR | Status: AC
  Filled 2014-07-10: qty 2

## 2014-07-10 MED ORDER — FENTANYL CITRATE (PF) 100 MCG/2ML IJ SOLN
INTRAMUSCULAR | Status: DC | PRN
  Administered 2014-07-10: 50 ug via INTRAVENOUS
  Administered 2014-07-10: 100 ug via INTRAVENOUS

## 2014-07-10 MED ORDER — OXYCODONE HCL 5 MG/5ML PO SOLN: 5.0000 mg | Freq: Once | ORAL | Status: DC | PRN

## 2014-07-10 MED ORDER — LACTATED RINGERS IV SOLN
INTRAVENOUS | Status: DC
  Administered 2014-07-10: 08:00:00 via INTRAVENOUS

## 2014-07-10 MED ORDER — PROPOFOL 10 MG/ML IV BOLUS
INTRAVENOUS | Status: AC
  Filled 2014-07-10: qty 20

## 2014-07-10 MED ORDER — HEMOSTATIC AGENTS (NO CHARGE) OPTIME
TOPICAL | Status: DC | PRN
  Administered 2014-07-10: 1 via TOPICAL

## 2014-07-10 MED ORDER — MIDAZOLAM HCL 5 MG/5ML IJ SOLN
INTRAMUSCULAR | Status: DC | PRN
  Administered 2014-07-10: 2 mg via INTRAVENOUS

## 2014-07-10 MED ORDER — PROPOFOL 10 MG/ML IV BOLUS
INTRAVENOUS | Status: DC | PRN
  Administered 2014-07-10: 200 mg via INTRAVENOUS

## 2014-07-10 MED ORDER — ONDANSETRON HCL 4 MG/2ML IJ SOLN
INTRAMUSCULAR | Status: DC | PRN
  Administered 2014-07-10: 4 mg via INTRAVENOUS

## 2014-07-10 MED ORDER — LIDOCAINE HCL (CARDIAC) 20 MG/ML IV SOLN
INTRAVENOUS | Status: DC | PRN
  Administered 2014-07-10: 50 mg via INTRAVENOUS

## 2014-07-10 MED ORDER — ACETAMINOPHEN 325 MG PO TABS
325.0000 mg | ORAL_TABLET | ORAL | Status: DC | PRN
Start: 2014-07-10 — End: 2014-07-10

## 2014-07-10 MED ORDER — BUPIVACAINE HCL (PF) 0.25 % IJ SOLN
INTRAMUSCULAR | Status: DC | PRN
  Administered 2014-07-10: 10 mL

## 2014-07-10 MED ORDER — GLYCOPYRROLATE 0.2 MG/ML IJ SOLN
INTRAMUSCULAR | Status: DC | PRN
  Administered 2014-07-10: 0.4 mg via INTRAVENOUS

## 2014-07-10 MED ORDER — ACETAMINOPHEN 160 MG/5ML PO SOLN
325.0000 mg | ORAL | Status: DC | PRN
Start: 2014-07-10 — End: 2014-07-10
  Filled 2014-07-10: qty 20.3

## 2014-07-10 SURGICAL SUPPLY — 54 items
APL SKNCLS STERI-STRIP NONHPOA (GAUZE/BANDAGES/DRESSINGS) ×1
ATTRACTOMAT 16X20 MAGNETIC DRP (DRAPES) ×2 IMPLANT
BENZOIN TINCTURE PRP APPL 2/3 (GAUZE/BANDAGES/DRESSINGS) ×2 IMPLANT
BLADE SURG 10 STRL SS (BLADE) ×2 IMPLANT
BLADE SURG 15 STRL LF DISP TIS (BLADE) ×1 IMPLANT
BLADE SURG 15 STRL SS (BLADE) ×2
CANISTER SUCTION 2500CC (MISCELLANEOUS) ×2 IMPLANT
CHLORAPREP W/TINT 10.5 ML (MISCELLANEOUS) ×2 IMPLANT
CLIP TI MEDIUM 6 (CLIP) ×2 IMPLANT
CLIP TI WIDE RED SMALL 6 (CLIP) ×2 IMPLANT
CONT SPEC 4OZ CLIKSEAL STRL BL (MISCELLANEOUS) ×2 IMPLANT
COVER SURGICAL LIGHT HANDLE (MISCELLANEOUS) ×2 IMPLANT
DRAPE PED LAPAROTOMY (DRAPES) ×2 IMPLANT
DRAPE UTILITY XL STRL (DRAPES) ×4 IMPLANT
ELECT CAUTERY BLADE 6.4 (BLADE) ×2 IMPLANT
ELECT REM PT RETURN 9FT ADLT (ELECTROSURGICAL) ×2
ELECTRODE REM PT RTRN 9FT ADLT (ELECTROSURGICAL) ×1 IMPLANT
GAUZE SPONGE 2X2 8PLY STRL LF (GAUZE/BANDAGES/DRESSINGS) ×1 IMPLANT
GAUZE SPONGE 4X4 16PLY XRAY LF (GAUZE/BANDAGES/DRESSINGS) ×2 IMPLANT
GLOVE BIO SURGEON STRL SZ7.5 (GLOVE) ×1 IMPLANT
GLOVE BIOGEL PI IND STRL 7.0 (GLOVE) IMPLANT
GLOVE BIOGEL PI IND STRL 7.5 (GLOVE) IMPLANT
GLOVE BIOGEL PI INDICATOR 7.0 (GLOVE) ×1
GLOVE BIOGEL PI INDICATOR 7.5 (GLOVE) ×1
GLOVE SURG ORTHO 8.0 STRL STRW (GLOVE) ×2 IMPLANT
GLOVE SURG SS PI 7.0 STRL IVOR (GLOVE) ×1 IMPLANT
GLOVE SURG SS PI 7.5 STRL IVOR (GLOVE) ×1 IMPLANT
GOWN STRL REUS W/ TWL LRG LVL3 (GOWN DISPOSABLE) ×2 IMPLANT
GOWN STRL REUS W/ TWL XL LVL3 (GOWN DISPOSABLE) ×1 IMPLANT
GOWN STRL REUS W/TWL LRG LVL3 (GOWN DISPOSABLE) ×4
GOWN STRL REUS W/TWL XL LVL3 (GOWN DISPOSABLE) ×2
HEMOSTAT SURGICEL 2X4 FIBR (HEMOSTASIS) ×2 IMPLANT
KIT BASIN OR (CUSTOM PROCEDURE TRAY) ×2 IMPLANT
KIT ROOM TURNOVER OR (KITS) ×2 IMPLANT
NDL HYPO 25GX1X1/2 BEV (NEEDLE) IMPLANT
NEEDLE HYPO 25GX1X1/2 BEV (NEEDLE) IMPLANT
NS IRRIG 1000ML POUR BTL (IV SOLUTION) ×2 IMPLANT
PACK SURGICAL SETUP 50X90 (CUSTOM PROCEDURE TRAY) ×2 IMPLANT
PAD ARMBOARD 7.5X6 YLW CONV (MISCELLANEOUS) ×4 IMPLANT
PENCIL BUTTON HOLSTER BLD 10FT (ELECTRODE) ×2 IMPLANT
SPONGE GAUZE 2X2 STER 10/PKG (GAUZE/BANDAGES/DRESSINGS) ×1
SPONGE INTESTINAL PEANUT (DISPOSABLE) IMPLANT
STRIP CLOSURE SKIN 1/2X4 (GAUZE/BANDAGES/DRESSINGS) ×2 IMPLANT
SUT MNCRL AB 4-0 PS2 18 (SUTURE) ×2 IMPLANT
SUT SILK 2 0 (SUTURE)
SUT SILK 2-0 18XBRD TIE 12 (SUTURE) IMPLANT
SUT SILK 3 0 (SUTURE)
SUT SILK 3-0 18XBRD TIE 12 (SUTURE) IMPLANT
SUT VIC AB 3-0 SH 18 (SUTURE) ×2 IMPLANT
SYR BULB 3OZ (MISCELLANEOUS) ×2 IMPLANT
SYR CONTROL 10ML LL (SYRINGE) IMPLANT
TOWEL OR 17X24 6PK STRL BLUE (TOWEL DISPOSABLE) ×2 IMPLANT
TOWEL OR 17X26 10 PK STRL BLUE (TOWEL DISPOSABLE) ×2 IMPLANT
TUBE CONNECTING 12X1/4 (SUCTIONS) ×2 IMPLANT

## 2014-07-10 NOTE — Anesthesia Procedure Notes (Signed)
Procedure Name: Intubation Date/Time: 07/10/2014 9:21 AM Performed by: Kyung Rudd Pre-anesthesia Checklist: Patient identified, Emergency Drugs available, Suction available, Patient being monitored and Timeout performed Patient Re-evaluated:Patient Re-evaluated prior to inductionOxygen Delivery Method: Circle system utilized Preoxygenation: Pre-oxygenation with 100% oxygen Intubation Type: IV induction Ventilation: Mask ventilation without difficulty and Oral airway inserted - appropriate to patient size Laryngoscope Size: Mac and 4 Grade View: Grade II Tube type: Oral Tube size: 7.5 mm Placement Confirmation: ETT inserted through vocal cords under direct vision,  positive ETCO2 and breath sounds checked- equal and bilateral Secured at: 22 cm Tube secured with: Tape Dental Injury: Teeth and Oropharynx as per pre-operative assessment

## 2014-07-10 NOTE — Interval H&P Note (Signed)
History and Physical Interval Note:  07/10/2014 8:48 AM  [name] [name]  has presented today for surgery, with the diagnosis of PRIMARY HYPERPARATHYROIDISM.  The various methods of treatment have been discussed with the patient and family. After consideration of risks, benefits and other options for treatment, the patient has consented to    Procedure(s): PARATHYROIDECTOMY (N/A) as a surgical intervention .    The patient's history has been reviewed, patient examined, no change in status, stable for surgery.  I have reviewed the patient's chart and labs.  Questions were answered to the patient's satisfaction.    Earnstine Regal, MD, [facility] Office: [place]

## 2014-07-10 NOTE — Op Note (Signed)
OPERATIVE REPORT - PARATHYROIDECTOMY  Preoperative diagnosis: Primary hyperparathyroidism  Postop diagnosis: Same  Procedure: Left superior minimally invasive parathyroidectomy  Surgeon:  Earnstine Regal, MD, FACS  Anesthesia: Gen. endotracheal  Estimated blood loss: Minimal  Preparation: ChloraPrep  Indications: The patient is a 74 year old male who presents with a parathyroid neoplasm. Patient is referred by Dr. Philemon Kingdom for evaluation of suspected primary hyperparathyroidism. Patient had been noted with hypercalcemia. He has a long history of nephrolithiasis. He was recently referred by his urologist, Dr. Preston Fleeting, to endocrinology for evaluation. Laboratory studies show elevated serum calcium levels ranging from 10.8-11.4. Intact PTH level is inappropriately normal at 47. 25-hydroxy vitamin D level was normal at 59. 24-hour urine collection for calcium was moderately elevated at 352. Nuclear scan localizes an adenoma to the left superior position.  Procedure: Patient was prepared in the holding area. He was brought to operating room and placed in a supine position on the operating room table. Following administration of general anesthesia, the patient was positioned and then prepped and draped in the usual strict aseptic fashion. After ascertaining that an adequate level of anesthesia been achieved, a neck incision was made with a #15 blade. Dissection was carried through subcutaneous tissues and platysma. Hemostasis was obtained with the electrocautery. Skin flaps were developed circumferentially and a Weitlander retractor was placed for exposure.  Strap muscles were incised in the midline. Strap muscles were reflected exposing the thyroid lobe. With gentle blunt dissection the thyroid lobe was mobilized.  Dissection was carried through adipose tissue and an enlarged parathyroid gland was identified. It was gently mobilized. Vascular structures were divided between  small and medium ligaclips. Care was taken to avoid the recurrent laryngeal nerve and the esophagus. The parathyroid gland was completely excised. It was submitted to pathology where frozen section confirmed parathyroid tissue consistent with adenoma.  Neck was irrigated with warm saline and good hemostasis was noted. Fibrillar was placed in the operative field. Strap muscles were reapproximated in the midline with interrupted 3-0 Vicryl sutures. Platysma was closed with interrupted 3-0 Vicryl sutures. Skin was closed with a running 4-0 Monocryl subcuticular suture. Marcaine was infiltrated circumferentially. Wound was washed and dried and benzoin and Steri-Strips were applied. Sterile gauze dressings were applied. Patient was awakened from anesthesia and brought to the recovery room. The patient tolerated the procedure well.   Earnstine Regal, MD, [facility]

## 2014-07-10 NOTE — Anesthesia Preprocedure Evaluation (Addendum)
Anesthesia Evaluation  Patient identified by MRN, date of birth, ID band Patient awake    Reviewed: Allergy & Precautions, NPO status , Patient's Chart, lab work & pertinent test results  History of Anesthesia Complications Negative for: history of anesthetic complications  Airway Mallampati: II  TM Distance: >3 FB Neck ROM: Full    Dental  (+) Teeth Intact   Pulmonary neg shortness of breath, neg sleep apnea, neg COPDneg recent URI, former smoker,  breath sounds clear to auscultation        Cardiovascular hypertension, Pt. on medications - angina- Past MI and - CHF Rhythm:Regular     Neuro/Psych negative neurological ROS  negative psych ROS   GI/Hepatic Neg liver ROS, GERD-  Medicated and Controlled,  Endo/Other  negative endocrine ROS  Renal/GU Renal InsufficiencyRenal disease     Musculoskeletal  (+) Arthritis -,   Abdominal   Peds  Hematology negative hematology ROS (+)   Anesthesia Other Findings   Reproductive/Obstetrics                            Anesthesia Physical Anesthesia Plan  ASA: III  Anesthesia Plan: General   Post-op Pain Management:    Induction: Intravenous  Airway Management Planned: Oral ETT  Additional Equipment: None  Intra-op Plan:   Post-operative Plan: Extubation in OR  Informed Consent: I have reviewed the patients History and Physical, chart, labs and discussed the procedure including the risks, benefits and alternatives for the proposed anesthesia with the patient or authorized representative who has indicated his/her understanding and acceptance.   Dental advisory given  Plan Discussed with: CRNA and Surgeon  Anesthesia Plan Comments:        Anesthesia Quick Evaluation

## 2014-07-10 NOTE — Transfer of Care (Signed)
Immediate Anesthesia Transfer of Care Note  Patient: [name] [name]  Procedure(s) Performed: Procedure(s): LEFT SUPERIOR PARATHYROIDECTOMY (Left)  Patient Location: PACU  Anesthesia Type:General  Level of Consciousness: awake, alert  and oriented  Airway & Oxygen Therapy: Patient Spontanous Breathing and Patient connected to face mask oxygen  Post-op Assessment: Report given to RN, Post -op Vital signs reviewed and stable and Patient moving all extremities  Post vital signs: Reviewed and stable  Last Vitals:  Filed Vitals:   07/10/14 1020  BP:   Pulse:   Temp: 36.4 C  Resp:     Complications: No apparent anesthesia complications

## 2014-07-10 NOTE — Anesthesia Postprocedure Evaluation (Signed)
  Anesthesia Post-op Note  Patient: [name] [name]  Procedure(s) Performed: Procedure(s): LEFT SUPERIOR PARATHYROIDECTOMY (Left)  Patient Location: PACU  Anesthesia Type:General  Level of Consciousness: awake  Airway and Oxygen Therapy: Patient Spontanous Breathing  Post-op Pain: none  Post-op Assessment: Post-op Vital signs reviewed, Patient's Cardiovascular Status Stable, Respiratory Function Stable, Patent Airway, No signs of Nausea or vomiting and Pain level controlled  Post-op Vital Signs: Reviewed and stable  Last Vitals:  Filed Vitals:   07/10/14 1144  BP: 118/80  Pulse: 72  Temp:   Resp: 16    Complications: No apparent anesthesia complications

## 2014-07-11 ENCOUNTER — Encounter (HOSPITAL_COMMUNITY): Payer: Self-pay | Admitting: Surgery

## 2014-07-12 DIAGNOSIS — N9989 Other postprocedural complications and disorders of genitourinary system: Secondary | ICD-10-CM | POA: Diagnosis not present

## 2014-07-17 DIAGNOSIS — R339 Retention of urine, unspecified: Secondary | ICD-10-CM | POA: Diagnosis not present

## 2014-07-18 DIAGNOSIS — R339 Retention of urine, unspecified: Secondary | ICD-10-CM | POA: Diagnosis not present

## 2014-07-19 DIAGNOSIS — E210 Primary hyperparathyroidism: Secondary | ICD-10-CM | POA: Diagnosis not present

## 2014-07-25 DIAGNOSIS — R339 Retention of urine, unspecified: Secondary | ICD-10-CM | POA: Diagnosis not present

## 2014-07-26 ENCOUNTER — Ambulatory Visit: Payer: Self-pay | Admitting: Physician Assistant

## 2014-07-26 ENCOUNTER — Telehealth: Payer: Self-pay | Admitting: *Deleted

## 2014-07-26 NOTE — Telephone Encounter (Signed)
Patient called and states he had parathyroid surgery and is not able to urinate.  He has a foley catheter inserted and is seeing Dr Dahlstedt's office regarding the problem.  The patient requested an RX to reduce his prostate from Dr Melford Aase.  Per Dr Melford Aase, patient needs to discuss with his urologist.  Patient aware.

## 2014-08-02 DIAGNOSIS — R339 Retention of urine, unspecified: Secondary | ICD-10-CM | POA: Diagnosis not present

## 2014-08-02 DIAGNOSIS — N200 Calculus of kidney: Secondary | ICD-10-CM | POA: Diagnosis not present

## 2014-08-03 ENCOUNTER — Other Ambulatory Visit: Payer: Self-pay | Admitting: Internal Medicine

## 2014-09-03 ENCOUNTER — Encounter: Payer: Self-pay | Admitting: *Deleted

## 2014-09-10 ENCOUNTER — Encounter: Payer: Self-pay | Admitting: Physician Assistant

## 2014-09-10 ENCOUNTER — Ambulatory Visit (INDEPENDENT_AMBULATORY_CARE_PROVIDER_SITE_OTHER): Payer: Medicare Other | Admitting: Physician Assistant

## 2014-09-10 VITALS — BP 120/70 | HR 56 | Temp 97.7°F | Resp 16 | Ht 71.5 in | Wt 248.0 lb

## 2014-09-10 DIAGNOSIS — H6592 Unspecified nonsuppurative otitis media, left ear: Secondary | ICD-10-CM

## 2014-09-10 DIAGNOSIS — E669 Obesity, unspecified: Secondary | ICD-10-CM | POA: Diagnosis not present

## 2014-09-10 DIAGNOSIS — G4733 Obstructive sleep apnea (adult) (pediatric): Secondary | ICD-10-CM

## 2014-09-10 DIAGNOSIS — H9202 Otalgia, left ear: Secondary | ICD-10-CM | POA: Diagnosis not present

## 2014-09-10 MED ORDER — AZITHROMYCIN 250 MG PO TABS: ORAL_TABLET | ORAL | Status: AC

## 2014-09-10 MED ORDER — FLUTICASONE PROPIONATE 50 MCG/ACT NA SUSP
2.0000 | Freq: Every day | NASAL | Status: DC
Start: 2014-09-10 — End: 2014-11-27

## 2014-09-10 NOTE — Patient Instructions (Signed)
Your ears and sinuses are connected by the eustachian tube. When your sinuses are inflamed, this can close off the tube and cause fluid to collect in your middle ear. This can then cause dizziness, popping, clicking, ringing, and echoing in your ears. This is often NOT an infection and does NOT require antibiotics, it is caused by inflammation so the treatments help the inflammation. This can take a long time to get better so please be patient.  Here are things you can do to help with this: - Try the Flonase or Nasonex. Remember to spray each nostril twice towards the outer part of your eye.  Do not sniff but instead pinch your nose and tilt your head back to help the medicine get into your sinuses.  The best time to do this is at bedtime.Stop if you get blurred vision or nose bleeds.  -While drinking fluids, pinch and hold nose close and swallow, to help open eustachian tubes to drain fluid behind ear drums. -Please pick one of the over the counter allergy medications below and take it once daily for allergies.  It will also help with fluid behind ear drums. Claritin or loratadine cheapest but likely the weakest  Zyrtec or certizine at night because it can make you sleepy The strongest is allegra or fexafinadine  Cheapest at [employer], [employer], [employer] -can use decongestant over the counter, please do not use if you have high blood pressure or certain heart conditions.   if worsening HA, changes vision/speech, imbalance, weakness go to the ER   I think it is possible that you have sleep apnea. It can cause interrupted sleep, headaches, frequent awakenings, fatigue, dry mouth, fast/slow heart beats, memory issues, anxiety/depression, swelling, numbness tingling hands/feet, weight gain, shortness of breath, and the list goes on. Sleep apnea needs to be ruled out because if it is left untreated it does eventually lead to abnormal heart beats, lung failure or heart failure as well as increasing the risk of  heart attack and stroke. There are masks you can wear OR a mouth piece that I can give you information about. Often times though people feel MUCH better after getting treatment.   Sleep Apnea  Sleep apnea is a sleep disorder characterized by abnormal pauses in breathing while you sleep. When your breathing pauses, the level of oxygen in your blood decreases. This causes you to move out of deep sleep and into light sleep. As a result, your quality of sleep is poor, and the system that carries your blood throughout your body (cardiovascular system) experiences stress. If sleep apnea remains untreated, the following conditions can develop:  High blood pressure (hypertension).  Coronary artery disease.  Inability to achieve or maintain an erection (impotence).  Impairment of your thought process (cognitive dysfunction). There are three types of sleep apnea: 1. Obstructive sleep apnea--Pauses in breathing during sleep because of a blocked airway. 2. Central sleep apnea--Pauses in breathing during sleep because the area of the brain that controls your breathing does not send the correct signals to the muscles that control breathing. 3. Mixed sleep apnea--A combination of both obstructive and central sleep apnea.  RISK FACTORS The following risk factors can increase your risk of developing sleep apnea:  Being overweight.  Smoking.  Having narrow passages in your nose and throat.  Being of older age.  Being male.  Alcohol use.  Sedative and tranquilizer use.  Ethnicity. Among individuals younger than 35 years, African Americans are at increased risk of sleep apnea. SYMPTOMS  Difficulty staying asleep.  Daytime sleepiness and fatigue.  Loss of energy.  Irritability.  Loud, heavy snoring.  Morning headaches.  Trouble concentrating.  Forgetfulness.  Decreased interest in sex. DIAGNOSIS  In order to diagnose sleep apnea, your caregiver will perform a physical examination.  Your caregiver may suggest that you take a home sleep test. Your caregiver may also recommend that you spend the night in a sleep lab. In the sleep lab, several monitors record information about your heart, lungs, and brain while you sleep. Your leg and arm movements and blood oxygen level are also recorded. TREATMENT The following actions may help to resolve mild sleep apnea:  Sleeping on your side.   Using a decongestant if you have nasal congestion.   Avoiding the use of depressants, including alcohol, sedatives, and narcotics.   Losing weight and modifying your diet if you are overweight. There also are devices and treatments to help open your airway:  Oral appliances. These are custom-made mouthpieces that shift your lower jaw forward and slightly open your bite. This opens your airway.  Devices that create positive airway pressure. This positive pressure "splints" your airway open to help you breathe better during sleep. The following devices create positive airway pressure:  Continuous positive airway pressure (CPAP) device. The CPAP device creates a continuous level of air pressure with an air pump. The air is delivered to your airway through a mask while you sleep. This continuous pressure keeps your airway open.  Nasal expiratory positive airway pressure (EPAP) device. The EPAP device creates positive air pressure as you exhale. The device consists of single-use valves, which are inserted into each nostril and held in place by adhesive. The valves create very little resistance when you inhale but create much more resistance when you exhale. That increased resistance creates the positive airway pressure. This positive pressure while you exhale keeps your airway open, making it easier to breath when you inhale again.  Bilevel positive airway pressure (BPAP) device. The BPAP device is used mainly in patients with central sleep apnea. This device is similar to the CPAP device because it  also uses an air pump to deliver continuous air pressure through a mask. However, with the BPAP machine, the pressure is set at two different levels. The pressure when you exhale is lower than the pressure when you inhale.  Surgery. Typically, surgery is only done if you cannot comply with less invasive treatments or if the less invasive treatments do not improve your condition. Surgery involves removing excess tissue in your airway to create a wider passage way. Document Released: 02/06/2002 Document Revised: 06/13/2012 Document Reviewed: 06/25/2011 [facility] Patient Information 2015 [place], [place]. This information is not intended to replace advice given to you by your health care provider. Make sure you discuss any questions you have with your health care provider.    Can call Dr. Toy Cookey to get evaluated for dental sleep appliance. # [phone]  OR you can try Dr. Clearence Ped in [facility] # [phone]  We will send notes. Call and get price quote on both.

## 2014-09-10 NOTE — Progress Notes (Signed)
Subjective:    Patient ID: [name] [name], male    DOB: 1941-01-01, 74 y.o.   MRN: [id number]  HPI 74 y.o. obese WM with history of HTN, GERD, PreDM, hyperparathyroidism presents with left ear tinnitus and fullness x 1-2 weeks getting worse. Has some sinus post nasal drip, no fever, chills, headaches, dizziness.   Blood pressure 120/70, pulse 56, temperature 97.7 F (36.5 C), resp. rate 16, height 5' 11.5" (1.816 m), weight 248 lb (112.492 kg).  Current Outpatient Prescriptions on File Prior to Visit  Medication Sig Dispense Refill  . aspirin 81 MG tablet Take 81 mg by mouth daily.    . B Complex Vitamins (VITAMIN-B COMPLEX PO) Take 1 capsule by mouth every evening. Vegetable formula    . bisoprolol-hydrochlorothiazide (ZIAC) 5-6.25 MG per tablet TAKE 1 TABLET BY MOUTH EVERY MORNING FOR BLOOD PRESSURE 90 tablet 3  . Cholecalciferol (VITAMIN D) 2000 UNITS tablet Take 4,000-6,000 Units by mouth daily. Three capsules in the morning = 6000 units and two capsules in the evening= 4000 units for a total of 10,000 mg units.    . fenofibrate (TRICOR) 145 MG tablet Take 1 tablet daily for triglycerides / blood fats 90 tablet 99  . Flaxseed, Linseed, (FLAXSEED OIL PO) Take 1 capsule by mouth every morning. 1000mg     . losartan (COZAAR) 100 MG tablet TAKE 1 TABLET BY MOUTH DAILY FOR BLOOD PRESSURE. 90 tablet 1  . Magnesium 250 MG TABS Take 250 mg by mouth every morning.     . meloxicam (MOBIC) 15 MG tablet Take 1 tablet (15 mg total) by mouth daily. (Patient taking differently: Take 15 mg by mouth every morning. ) 90 tablet 11  . Omega-3 Fatty Acids (OMEGA-3 FISH OIL) 1200 MG CAPS Take 1,200 mg by mouth 2 (two) times daily.    Marland Kitchen omeprazole (PRILOSEC) 40 MG capsule Take 1 capsule (40 mg total) by mouth daily. (Patient taking differently: Take 40 mg by mouth every morning. ) 90 capsule 3  . oxyCODONE (OXY IR/ROXICODONE) 5 MG immediate release tablet Take 1-2 tablets (5-10 mg total) by mouth every 4 (four)  hours as needed for moderate pain. 20 tablet 0  . terbinafine (LAMISIL) 250 MG tablet Take 250 mg by mouth daily.     No current facility-administered medications on file prior to visit.   Past Medical History  Diagnosis Date  . Hyperlipidemia   . Hypertension   . GERD (gastroesophageal reflux disease)   . Obese   . History of kidney stones     hx. multiple kidney stones  . Asthma     childhood yrs  only  . Arthritis     right foot  . COPD (chronic obstructive pulmonary disease)   . Hypoparathyroidism      Review of Systems  Constitutional: Positive for fatigue. Negative for fever, chills, diaphoresis, activity change, appetite change and unexpected weight change.  HENT: Positive for congestion, ear pain, postnasal drip, rhinorrhea, sinus pressure and tinnitus. Negative for dental problem, drooling, ear discharge, facial swelling, hearing loss, mouth sores, nosebleeds, sneezing, sore throat, trouble swallowing and voice change.        + snoring  Respiratory: Negative.   Cardiovascular: Negative.   Gastrointestinal: Negative.   Genitourinary: Negative.   Neurological: Negative.   Hematological: Negative.        Objective:   Physical Exam  Constitutional: He appears well-developed and well-nourished.  HENT:  Right Ear: Hearing, external ear and ear canal normal. No mastoid tenderness.  Tympanic membrane is not perforated and not erythematous. A middle ear effusion is present.  Left Ear: Hearing, external ear and ear canal normal. No mastoid tenderness. Tympanic membrane is not perforated and not erythematous. A middle ear effusion is present.  Crowded mouth  Eyes: Conjunctivae are normal. Pupils are equal, round, and reactive to light.  Neck: Normal range of motion. Neck supple.  Cardiovascular: Normal rate and regular rhythm.   Pulmonary/Chest: Effort normal and breath sounds normal. He has no wheezes.  Lymphadenopathy:    He has no cervical adenopathy.      Assessment  & Plan:  Left ear pain- + otitis effusion-no fever, chills- zpak, flonase, autoinflation Snoring- Probable sleep apnea- get sleep study, weight loss advised.

## 2014-09-17 DIAGNOSIS — N200 Calculus of kidney: Secondary | ICD-10-CM | POA: Diagnosis not present

## 2014-09-17 DIAGNOSIS — N400 Enlarged prostate without lower urinary tract symptoms: Secondary | ICD-10-CM | POA: Diagnosis not present

## 2014-09-25 ENCOUNTER — Ambulatory Visit: Payer: Self-pay | Admitting: Internal Medicine

## 2014-09-27 ENCOUNTER — Other Ambulatory Visit: Payer: Self-pay | Admitting: Internal Medicine

## 2014-10-08 DIAGNOSIS — N200 Calculus of kidney: Secondary | ICD-10-CM | POA: Diagnosis not present

## 2014-10-24 ENCOUNTER — Other Ambulatory Visit: Payer: Self-pay | Admitting: Internal Medicine

## 2014-11-12 DIAGNOSIS — L570 Actinic keratosis: Secondary | ICD-10-CM | POA: Diagnosis not present

## 2014-11-12 DIAGNOSIS — Z85828 Personal history of other malignant neoplasm of skin: Secondary | ICD-10-CM | POA: Diagnosis not present

## 2014-11-12 DIAGNOSIS — L821 Other seborrheic keratosis: Secondary | ICD-10-CM | POA: Diagnosis not present

## 2014-11-12 DIAGNOSIS — L812 Freckles: Secondary | ICD-10-CM | POA: Diagnosis not present

## 2014-11-12 DIAGNOSIS — L738 Other specified follicular disorders: Secondary | ICD-10-CM | POA: Diagnosis not present

## 2014-11-27 ENCOUNTER — Ambulatory Visit (INDEPENDENT_AMBULATORY_CARE_PROVIDER_SITE_OTHER): Payer: Medicare Other | Admitting: Internal Medicine

## 2014-11-27 ENCOUNTER — Encounter: Payer: Self-pay | Admitting: Internal Medicine

## 2014-11-27 VITALS — BP 134/78 | HR 56 | Temp 97.7°F | Resp 16 | Ht 71.5 in | Wt 250.0 lb

## 2014-11-27 DIAGNOSIS — E785 Hyperlipidemia, unspecified: Secondary | ICD-10-CM | POA: Diagnosis not present

## 2014-11-27 DIAGNOSIS — K219 Gastro-esophageal reflux disease without esophagitis: Secondary | ICD-10-CM

## 2014-11-27 DIAGNOSIS — I10 Essential (primary) hypertension: Secondary | ICD-10-CM | POA: Diagnosis not present

## 2014-11-27 DIAGNOSIS — Z6834 Body mass index (BMI) 34.0-34.9, adult: Secondary | ICD-10-CM | POA: Diagnosis not present

## 2014-11-27 DIAGNOSIS — Z79899 Other long term (current) drug therapy: Secondary | ICD-10-CM | POA: Diagnosis not present

## 2014-11-27 DIAGNOSIS — E559 Vitamin D deficiency, unspecified: Secondary | ICD-10-CM

## 2014-11-27 DIAGNOSIS — R7309 Other abnormal glucose: Secondary | ICD-10-CM | POA: Diagnosis not present

## 2014-11-27 DIAGNOSIS — R7303 Prediabetes: Secondary | ICD-10-CM

## 2014-11-27 DIAGNOSIS — Z23 Encounter for immunization: Secondary | ICD-10-CM | POA: Diagnosis not present

## 2014-11-27 DIAGNOSIS — Z0000 Encounter for general adult medical examination without abnormal findings: Secondary | ICD-10-CM | POA: Insufficient documentation

## 2014-11-27 LAB — CBC WITH DIFFERENTIAL/PLATELET
Basophils Absolute: 0.1 10*3/uL (ref 0.0–0.1)
Basophils Relative: 1 % (ref 0–1)
Eosinophils Absolute: 0.3 10*3/uL (ref 0.0–0.7)
Eosinophils Relative: 5 % (ref 0–5)
HCT: 45.8 % (ref 39.0–52.0)
Hemoglobin: 16.1 g/dL (ref 13.0–17.0)
Lymphocytes Relative: 32 % (ref 12–46)
Lymphs Abs: 1.7 10*3/uL (ref 0.7–4.0)
MCH: 31.9 pg (ref 26.0–34.0)
MCHC: 35.2 g/dL (ref 30.0–36.0)
MCV: 90.9 fL (ref 78.0–100.0)
MPV: 10 fL (ref 8.6–12.4)
Monocytes Absolute: 0.5 10*3/uL (ref 0.1–1.0)
Monocytes Relative: 10 % (ref 3–12)
Neutro Abs: 2.8 10*3/uL (ref 1.7–7.7)
Neutrophils Relative %: 52 % (ref 43–77)
Platelets: 238 10*3/uL (ref 150–400)
RBC: 5.04 MIL/uL (ref 4.22–5.81)
RDW: 13.3 % (ref 11.5–15.5)
WBC: 5.3 10*3/uL (ref 4.0–10.5)

## 2014-11-27 LAB — BASIC METABOLIC PANEL WITH GFR
BUN: 22 mg/dL (ref 7–25)
CO2: 26 mmol/L (ref 20–31)
Calcium: 10.2 mg/dL (ref 8.6–10.3)
Chloride: 102 mmol/L (ref 98–110)
Creat: 1.22 mg/dL — ABNORMAL HIGH (ref 0.70–1.18)
GFR, Est African American: 69 mL/min (ref >=60)
GFR, Est Non African American: 59 mL/min — ABNORMAL LOW (ref >=60)
Glucose, Bld: 115 mg/dL — ABNORMAL HIGH (ref 65–99)
Potassium: 4.3 mmol/L (ref 3.5–5.3)
Sodium: 139 mmol/L (ref 135–146)

## 2014-11-27 LAB — LIPID PANEL
Cholesterol: 195 mg/dL (ref 125–200)
HDL: 52 mg/dL (ref >=40)
LDL Cholesterol: 119 mg/dL (ref <130)
Total CHOL/HDL Ratio: 3.8 Ratio (ref <=5.0)
Triglycerides: 120 mg/dL (ref <150)
VLDL: 24 mg/dL (ref <30)

## 2014-11-27 LAB — HEPATIC FUNCTION PANEL
ALT: 27 U/L (ref 9–46)
AST: 26 U/L (ref 10–35)
Albumin: 4.5 g/dL (ref 3.6–5.1)
Alkaline Phosphatase: 32 U/L — ABNORMAL LOW (ref 40–115)
Bilirubin, Direct: 0.2 mg/dL (ref <=0.2)
Indirect Bilirubin: 0.8 mg/dL (ref 0.2–1.2)
Total Bilirubin: 1 mg/dL (ref 0.2–1.2)
Total Protein: 7.2 g/dL (ref 6.1–8.1)

## 2014-11-27 LAB — MAGNESIUM: Magnesium: 1.7 mg/dL (ref 1.5–2.5)

## 2014-11-27 NOTE — Progress Notes (Signed)
Patient ID: [name] [name], male   DOB: 04-Apr-1940, 74 y.o.   MRN: [id number]     This very nice 74 y.o. MWM presents for 3 month follow up with Hypertension, Hyperlipidemia, Pre-Diabetes and Vitamin D Deficiency. Patient recently underwent parathyroidectomy for hyperparathyroidism / hypercalcemia with concomitant hx/o recurrent calcium kidney stones.      Patient is treated for HTN since 2002  BP has been controlled at home. Today's BP: 134/78 mmHg. Patient has had no complaints of any cardiac type chest pain, palpitations, dyspnea/orthopnea/PND, dizziness, claudication, or dependent edema.     Hyperlipidemia is controlled with diet & meds. Patient denies myalgias or other med SE's. Last Lipids were not at goal -  Cholesterol 196; HDL 62; LDL 109*; Triglycerides 124 on 04/25/2014.     Also, the patient has history of Morbid Obesity (BMI 34.39) and consequent  PreDiabetes since 2010 with A1c 6.4% & elevated insulin 124. He has had no symptoms of reactive hypoglycemia, diabetic polys, paresthesias or visual blurring.  Last A1c was  6.0% on 04/25/2014.     Further, the patient also has history of Vitamin D Deficiency of 32 in 2009  and supplements vitamin D without any suspected side-effects. Last vitamin D was  59 on 04/25/2014.  Medication Sig  . aspirin 81 MG tablet Take  daily.  Marland Kitchen VITAMIN-B COMPLEX  Take 1 cap every evening  . bisoprolol-hctz (ZIAC) 5-6.25 MG TAKE 1 TAB EVERY MORNING   . VITAMIN D 2000 UNITS  Take 10,000 mg units/da  . fenofibrate 145 MG tablet Take 1 tab daily for triglycerides / blood fats  . FLAXSEED OIL 1000mg  Take 1 cap every morning.   Marland Kitchen losartan100 MG tablet TAKE 1 TAB DAILY  . Magnesium 250 MG TABS Take every morning.   . meloxicam  15 MG tablet Take 1 tablet  daily  .  FISH OIL 1200 MG CAPS Take  2  times daily.  Marland Kitchen omeprazole  40 MG capsule TAKE 1 CAP DAILY.  Marland Kitchen terbinafine (LAMISIL) 250 MG tablet Take 250 mg  daily.  Marland Kitchen FLONASE nasal spray Place 2 sprays into both  nostrils at bedtime.   Allergies  Allergen Reactions  . Iohexol Shortness Of Breath  . Ace Inhibitors     cough   PMHx:   Past Medical History  Diagnosis Date  . Hyperlipidemia   . Hypertension   . GERD (gastroesophageal reflux disease)   . Obese   . History of kidney stones     hx. multiple kidney stones  . Asthma     childhood yrs  only  . Arthritis     right foot  . COPD (chronic obstructive pulmonary disease)   . Hypoparathyroidism    Immunization History  Administered Date(s) Administered  . Influenza Split 12/27/2012  . Influenza, High Dose Seasonal PF 01/31/2014, 11/27/2014  . Pneumococcal Polysaccharide-23 03/02/2008  . Td 03/02/2005  . Zoster 03/02/2006   Past Surgical History  Procedure Laterality Date  . Tumor ear Left     age 89 topical growth behind left ear.  . Cystoscopy w/ ureteroscopy w/ lithotripsy    . Inguinal hernia repair      left  . Colonoscopy    . Upper gastrointestinal endoscopy    . Nephrolithotomy Left 02/01/2014    Procedure: NEPHROLITHOTOMY PERCUTANEOUS;  Surgeon: Jorja Loa, MD;  Location: [facility];  Service: Urology;  Laterality: Left;  . Parathyroidectomy Left 07/10/2014    Procedure: LEFT SUPERIOR PARATHYROIDECTOMY;  Surgeon: Sherren Mocha  Gerkin, MD;  Location: [facility];  Service: General;  Laterality: Left;   FHx:    Reviewed / unchanged  SHx:    Reviewed / unchanged  Systems Review:  Constitutional: Denies fever, chills, wt changes, headaches, insomnia, fatigue, night sweats, change in appetite. Eyes: Denies redness, blurred vision, diplopia, discharge, itchy, watery eyes.  ENT: Denies discharge, congestion, post nasal drip, epistaxis, sore throat, earache, hearing loss, dental pain, tinnitus, vertigo, sinus pain, snoring.  CV: Denies chest pain, palpitations, irregular heartbeat, syncope, dyspnea, diaphoresis, orthopnea, PND, claudication or edema. Respiratory: denies cough, dyspnea, DOE, pleurisy, hoarseness, laryngitis, wheezing.   Gastrointestinal: Denies dysphagia, odynophagia, heartburn, reflux, water brash, abdominal pain or cramps, nausea, vomiting, bloating, diarrhea, constipation, hematemesis, melena, hematochezia  or hemorrhoids. Genitourinary: Denies dysuria, frequency, urgency, nocturia, hesitancy, discharge, hematuria or flank pain. Musculoskeletal: Denies arthralgias, myalgias, stiffness, jt. swelling, pain, limping or strain/sprain.  Skin: Denies pruritus, rash, hives, warts, acne, eczema or change in skin lesion(s). Neuro: No weakness, tremor, incoordination, spasms, paresthesia or pain. Psychiatric: Denies confusion, memory loss or sensory loss. Endo: Denies change in weight, skin or hair change.  Heme/Lymph: No excessive bleeding, bruising or enlarged lymph nodes.  Physical Exam  BP 134/78 mmHg  Pulse 56  Temp(Src) 97.7 F (36.5 C)  Resp 16  Ht 5' 11.5" (1.816 m)  Wt 250 lb (113.399 kg)  BMI 34.39 kg/m2  Appears well nourished and in no distress. Eyes: PERRLA, EOMs, conjunctiva no swelling or erythema. Sinuses: No frontal/maxillary tenderness ENT/Mouth: EAC's clear, TM's nl w/o erythema, bulging. Nares clear w/o erythema, swelling, exudates. Oropharynx clear without erythema or exudates. Oral hygiene is good. Tongue normal, non obstructing. Hearing intact.  Neck: Supple. Thyroid nl. Car 2+/2+ without bruits, nodes or JVD. Chest: Respirations nl with BS clear & equal w/o rales, rhonchi, wheezing or stridor.  Cor: Heart sounds normal w/ regular rate and rhythm without sig. murmurs, gallops, clicks, or rubs. Peripheral pulses normal and equal  without edema.  Abdomen: Soft & bowel sounds normal. Non-tender w/o guarding, rebound, hernias, masses, or organomegaly.  Lymphatics: Unremarkable.  Musculoskeletal: Full ROM all peripheral extremities, joint stability, 5/5 strength, and normal gait.  Skin: Warm, dry without exposed rashes, lesions or ecchymosis apparent.  Neuro: Cranial nerves intact,  reflexes equal bilaterally. Sensory-motor testing grossly intact. Tendon reflexes grossly intact.  Pysch: Alert & oriented x 3.  Insight and judgement nl & appropriate. No ideations.  Assessment and Plan:  1. Essential hypertension  - TSH  2. Hyperlipidemia  - Lipid panel  3. Prediabetes  - Hemoglobin A1c - Insulin, random  4. Vitamin D deficiency  - Vit D  25 hydroxy  5. Gastroesophageal reflux disease   6. Medication management  - CBC with Differential/Platelet - BASIC METABOLIC PANEL WITH GFR - Hepatic function panel - Magnesium  7. BMI 34.0-34.9,adult   8. Need for prophylactic vaccination and inoculation against influenza  - Flu vaccine HIGH DOSE PF (Fluzone High dose)   Recommended regular exercise, BP monitoring, weight control, and discussed med and SE's. Recommended labs to assess and monitor clinical status. Further disposition pending results of labs. Over 30 minutes of exam, counseling, chart review was performed

## 2014-11-27 NOTE — Patient Instructions (Signed)

## 2014-11-28 LAB — VITAMIN D 25 HYDROXY (VIT D DEFICIENCY, FRACTURES): Vit D, 25-Hydroxy: 64 ng/mL (ref 30–100)

## 2014-11-28 LAB — TSH: TSH: 2.205 u[IU]/mL (ref 0.350–4.500)

## 2014-11-28 LAB — HEMOGLOBIN A1C
Hgb A1c MFr Bld: 5.8 % — ABNORMAL HIGH (ref <5.7)
Mean Plasma Glucose: 120 mg/dL — ABNORMAL HIGH (ref <117)

## 2014-11-28 LAB — INSULIN, RANDOM: Insulin: 45.2 u[IU]/mL — ABNORMAL HIGH (ref 2.0–19.6)

## 2014-12-19 DIAGNOSIS — H2513 Age-related nuclear cataract, bilateral: Secondary | ICD-10-CM | POA: Diagnosis not present

## 2014-12-19 DIAGNOSIS — H0289 Other specified disorders of eyelid: Secondary | ICD-10-CM | POA: Diagnosis not present

## 2014-12-19 DIAGNOSIS — D3132 Benign neoplasm of left choroid: Secondary | ICD-10-CM | POA: Diagnosis not present

## 2015-01-03 ENCOUNTER — Other Ambulatory Visit (HOSPITAL_COMMUNITY): Payer: Medicare Other

## 2015-01-17 ENCOUNTER — Other Ambulatory Visit: Payer: Self-pay | Admitting: Internal Medicine

## 2015-03-04 ENCOUNTER — Other Ambulatory Visit: Payer: Self-pay | Admitting: Internal Medicine

## 2015-03-06 ENCOUNTER — Ambulatory Visit: Payer: Self-pay | Admitting: Physician Assistant

## 2015-03-18 DIAGNOSIS — N400 Enlarged prostate without lower urinary tract symptoms: Secondary | ICD-10-CM | POA: Diagnosis not present

## 2015-03-18 DIAGNOSIS — N200 Calculus of kidney: Secondary | ICD-10-CM | POA: Diagnosis not present

## 2015-03-18 DIAGNOSIS — Z0000 Encounter for general adult medical examination without abnormal findings: Secondary | ICD-10-CM | POA: Diagnosis not present

## 2015-03-25 ENCOUNTER — Encounter: Payer: Self-pay | Admitting: Physician Assistant

## 2015-03-25 ENCOUNTER — Ambulatory Visit (INDEPENDENT_AMBULATORY_CARE_PROVIDER_SITE_OTHER): Payer: Medicare Other | Admitting: Physician Assistant

## 2015-03-25 VITALS — BP 126/74 | HR 74 | Temp 98.6°F | Resp 16 | Ht 71.0 in | Wt 255.0 lb

## 2015-03-25 DIAGNOSIS — K219 Gastro-esophageal reflux disease without esophagitis: Secondary | ICD-10-CM

## 2015-03-25 DIAGNOSIS — R6889 Other general symptoms and signs: Secondary | ICD-10-CM | POA: Diagnosis not present

## 2015-03-25 DIAGNOSIS — Z0000 Encounter for general adult medical examination without abnormal findings: Secondary | ICD-10-CM

## 2015-03-25 DIAGNOSIS — R7309 Other abnormal glucose: Secondary | ICD-10-CM | POA: Diagnosis not present

## 2015-03-25 DIAGNOSIS — N200 Calculus of kidney: Secondary | ICD-10-CM

## 2015-03-25 DIAGNOSIS — I10 Essential (primary) hypertension: Secondary | ICD-10-CM | POA: Diagnosis not present

## 2015-03-25 DIAGNOSIS — N183 Chronic kidney disease, stage 3 unspecified: Secondary | ICD-10-CM

## 2015-03-25 DIAGNOSIS — Z23 Encounter for immunization: Secondary | ICD-10-CM

## 2015-03-25 DIAGNOSIS — E785 Hyperlipidemia, unspecified: Secondary | ICD-10-CM

## 2015-03-25 DIAGNOSIS — E559 Vitamin D deficiency, unspecified: Secondary | ICD-10-CM

## 2015-03-25 DIAGNOSIS — K222 Esophageal obstruction: Secondary | ICD-10-CM

## 2015-03-25 DIAGNOSIS — R0683 Snoring: Secondary | ICD-10-CM

## 2015-03-25 DIAGNOSIS — R7303 Prediabetes: Secondary | ICD-10-CM | POA: Diagnosis not present

## 2015-03-25 DIAGNOSIS — E210 Primary hyperparathyroidism: Secondary | ICD-10-CM

## 2015-03-25 DIAGNOSIS — Z0001 Encounter for general adult medical examination with abnormal findings: Secondary | ICD-10-CM | POA: Diagnosis not present

## 2015-03-25 DIAGNOSIS — Z79899 Other long term (current) drug therapy: Secondary | ICD-10-CM | POA: Diagnosis not present

## 2015-03-25 LAB — CBC WITH DIFFERENTIAL/PLATELET
Basophils Absolute: 0 10*3/uL (ref 0.0–0.1)
Basophils Relative: 0 % (ref 0–1)
Eosinophils Absolute: 0.5 10*3/uL (ref 0.0–0.7)
Eosinophils Relative: 7 % — ABNORMAL HIGH (ref 0–5)
HCT: 46.2 % (ref 39.0–52.0)
Hemoglobin: 15.8 g/dL (ref 13.0–17.0)
Lymphocytes Relative: 25 % (ref 12–46)
Lymphs Abs: 1.9 10*3/uL (ref 0.7–4.0)
MCH: 31.5 pg (ref 26.0–34.0)
MCHC: 34.2 g/dL (ref 30.0–36.0)
MCV: 92.2 fL (ref 78.0–100.0)
MPV: 10.1 fL (ref 8.6–12.4)
Monocytes Absolute: 0.9 10*3/uL (ref 0.1–1.0)
Monocytes Relative: 12 % (ref 3–12)
Neutro Abs: 4.2 10*3/uL (ref 1.7–7.7)
Neutrophils Relative %: 56 % (ref 43–77)
Platelets: 305 10*3/uL (ref 150–400)
RBC: 5.01 MIL/uL (ref 4.22–5.81)
RDW: 12.8 % (ref 11.5–15.5)
WBC: 7.5 10*3/uL (ref 4.0–10.5)

## 2015-03-25 LAB — HEMOGLOBIN A1C
Hgb A1c MFr Bld: 6 % — ABNORMAL HIGH (ref <5.7)
Mean Plasma Glucose: 126 mg/dL — ABNORMAL HIGH (ref <117)

## 2015-03-25 LAB — BASIC METABOLIC PANEL WITH GFR
BUN: 25 mg/dL (ref 7–25)
CO2: 23 mmol/L (ref 20–31)
Calcium: 9.8 mg/dL (ref 8.6–10.3)
Chloride: 101 mmol/L (ref 98–110)
Creat: 1.53 mg/dL — ABNORMAL HIGH (ref 0.70–1.18)
GFR, Est African American: 52 mL/min — ABNORMAL LOW (ref >=60)
GFR, Est Non African American: 45 mL/min — ABNORMAL LOW (ref >=60)
Glucose, Bld: 90 mg/dL (ref 65–99)
Potassium: 4.4 mmol/L (ref 3.5–5.3)
Sodium: 136 mmol/L (ref 135–146)

## 2015-03-25 LAB — HEPATIC FUNCTION PANEL
ALT: 24 U/L (ref 9–46)
AST: 23 U/L (ref 10–35)
Albumin: 4.4 g/dL (ref 3.6–5.1)
Alkaline Phosphatase: 25 U/L — ABNORMAL LOW (ref 40–115)
Bilirubin, Direct: 0.2 mg/dL (ref <=0.2)
Indirect Bilirubin: 0.5 mg/dL (ref 0.2–1.2)
Total Bilirubin: 0.7 mg/dL (ref 0.2–1.2)
Total Protein: 7.1 g/dL (ref 6.1–8.1)

## 2015-03-25 LAB — MAGNESIUM: Magnesium: 1.9 mg/dL (ref 1.5–2.5)

## 2015-03-25 LAB — LIPID PANEL
Cholesterol: 177 mg/dL (ref 125–200)
HDL: 50 mg/dL (ref >=40)
LDL Cholesterol: 101 mg/dL (ref <130)
Total CHOL/HDL Ratio: 3.5 Ratio (ref <=5.0)
Triglycerides: 132 mg/dL (ref <150)
VLDL: 26 mg/dL (ref <30)

## 2015-03-25 LAB — TSH: TSH: 2.863 u[IU]/mL (ref 0.350–4.500)

## 2015-03-25 NOTE — Progress Notes (Signed)
MEDICARE ANNUAL WELLNESS VISIT AND FOLLOW UP Assessment:   1. Essential hypertension - continue medications, DASH diet, exercise and monitor at home. Call if greater than 130/80.  - CBC with Differential/Platelet - BASIC METABOLIC PANEL WITH GFR - TSH - Hepatic function panel  2. Morbid obesity, unspecified obesity type ([place]) Obesity with co morbidities- long discussion about weight loss, diet, and exercise - Ambulatory referral to Sleep Studies  3. Hyperparathyroidism, primary ([place]) - BASIC METABOLIC PANEL WITH GFR  4. Prediabetes - Hemoglobin A1c  5. Hyperlipidemia - Lipid panel  6. Medicare annual wellness visit, subsequent Will need tetanus next OV  7. Medication management - Magnesium  8. Vitamin D deficiency - VITAMIN D 25 Hydroxy (Vit-D Deficiency, Fractures)  9. Kidney stones - BASIC METABOLIC PANEL WITH GFR  10. Stricture and stenosis of esophagus Continue PPI  11. Gastroesophageal reflux disease, esophagitis presence not specified Continue PPI  12. CKD (chronic kidney disease) stage 3, GFR 30-59 ml/min Increase fluids, avoid NSAIDS, monitor sugars, will monitor - BASIC METABOLIC PANEL WITH GFR  13. Snoring Obese, fatigue, snoring, likely sleep apnea, wants in [place] - Ambulatory referral to Sleep Studies  14. Need for prophylactic vaccination against Streptococcus pneumoniae (pneumococcus) - Pneumococcal conjugate vaccine 13-valent IM  Over 30 minutes of exam, counseling, chart review, and critical decision making was performed  Plan:   During the course of the visit the patient was educated and counseled about appropriate screening and preventive services including:    Pneumococcal vaccine   Influenza vaccine  Prevnar 13  Td vaccine  Screening electrocardiogram  Colorectal cancer screening  Diabetes screening  Glaucoma screening  Nutrition counseling   Conditions/risks identified: BMI: Discussed weight loss, diet, and increase  physical activity.  Increase physical activity: AHA recommends 150 minutes of physical activity a week.  Medications reviewed Diabetes is at goal, ACE/ARB therapy: No, Reason not on Ace Inhibitor/ARB therapy:  only PreDM Urinary Incontinence is not an issue: discussed non pharmacology and pharmacology options.  Fall risk: low- discussed PT, home fall assessment, medications.    Subjective:  [name] [name] is a 75 y.o. male who presents for Medicare Annual Wellness Visit and 3 month follow up for HTN, hyperlipidemia, prediabetes, and vitamin D Def.  Date of last medicare wellness visit was 04/2014  His blood pressure has been controlled at home, today their BP is BP: 126/74 mmHg He does not workout. He denies chest pain, shortness of breath, dizziness.  Has chronic GERD, on PPI, has had esophagus stretched 3 times.  He is on cholesterol medication and denies myalgias. His cholesterol is at goal. The cholesterol last visit was:   Lab Results  Component Value Date   CHOL 195 11/27/2014   HDL 52 11/27/2014   LDLCALC 119 11/27/2014   TRIG 120 11/27/2014   CHOLHDL 3.8 11/27/2014   He has been working on diet and exercise for prediabetes, and denies paresthesia of the feet, polydipsia, polyuria and visual disturbances. Last A1C in the office was:  Lab Results  Component Value Date   HGBA1C 5.8* 11/27/2014  Last GFR  Lab Results  Component Value Date   GFRNONAA 20* 11/27/2014  Patient is on Vitamin D supplement.   Lab Results  Component Value Date   VD25OH 64 11/27/2014     BMI is Body mass index is 35.58 kg/(m^2)., he is working on diet and exercise. Has fatigue in the morning, has snoring, wants to get sleep study in [place], will need sleep medication Wt Readings  from Last 3 Encounters:  03/25/15 255 lb (115.667 kg)  11/27/14 250 lb (113.399 kg)  09/10/14 248 lb (112.492 kg)     Medication Review: Current Outpatient Prescriptions on File Prior to Visit  Medication Sig Dispense  Refill  . aspirin 81 MG tablet Take 81 mg by mouth daily.    . B Complex Vitamins (VITAMIN-B COMPLEX PO) Take 1 capsule by mouth every evening. Vegetable formula    . bisoprolol-hydrochlorothiazide (ZIAC) 5-6.25 MG per tablet TAKE 1 TABLET BY MOUTH EVERY MORNING FOR BLOOD PRESSURE 90 tablet 3  . Cholecalciferol (VITAMIN D) 2000 UNITS tablet Take 4,000-6,000 Units by mouth daily. Three capsules in the morning = 6000 units and two capsules in the evening= 4000 units for a total of 10,000 mg units.    . Flaxseed, Linseed, (FLAXSEED OIL PO) Take 1 capsule by mouth every morning. 1000mg     . losartan (COZAAR) 100 MG tablet TAKE 1 TABLET BY MOUTH DAILY FOR BLOOD PRESSURE. 90 tablet 1  . Magnesium 250 MG TABS Take 250 mg by mouth every morning.     . meloxicam (MOBIC) 15 MG tablet TAKE 1 TABLET BY MOUTH DAILY. 90 tablet 11  . Omega-3 Fatty Acids (OMEGA-3 FISH OIL) 1200 MG CAPS Take 1,200 mg by mouth 2 (two) times daily.    Marland Kitchen omeprazole (PRILOSEC) 40 MG capsule TAKE 1 CAPSULE BY MOUTH DAILY. 90 capsule 3  . tamsulosin (FLOMAX) 0.4 MG CAPS capsule   3  . terbinafine (LAMISIL) 250 MG tablet Take 250 mg by mouth daily.    . fenofibrate (TRICOR) 145 MG tablet Take 1 tablet daily for triglycerides / blood fats 90 tablet 99   No current facility-administered medications on file prior to visit.    Current Problems (verified) Patient Active Problem List   Diagnosis Date Noted  . CKD (chronic kidney disease) stage 3, GFR 30-59 ml/min 03/25/2015  . Snoring 03/25/2015  . Medicare annual wellness visit, subsequent 11/27/2014  . Hyperparathyroidism, primary ([place]) 07/09/2014  . Vitamin D deficiency 06/13/2013  . Prediabetes 06/13/2013  . Medication management 06/13/2013  . Hyperlipidemia   . Hypertension   . GERD (gastroesophageal reflux disease)   . Kidney stones   . Morbidly obese (34.39)   . Stricture and stenosis of esophagus 10/18/2008    Screening Tests Immunization History  Administered  Date(s) Administered  . Influenza Split 12/27/2012  . Influenza, High Dose Seasonal PF 01/31/2014, 11/27/2014  . Pneumococcal Conjugate-13 03/25/2015  . Pneumococcal Polysaccharide-23 03/02/2008  . Td 03/02/2005  . Zoster 03/02/2006   Preventative care: Last colonoscopy: 2008  Prior vaccinations: TD or Tdap: 2007 DUE, get next OV  Influenza: 2016  Pneumococcal: 2010 Prevnar13: DUE Shingles/Zostavax: 2008  Names of Other Physician/Practitioners you currently use: 1. [facility] here for primary care 2. Dr. Katy Fitch, eye doctor, last visit 11/2014 3. Dr. Zachery Dakins, dentist, last visit 01/2015 Patient Care Team: Unk Pinto, MD as PCP - General (Internal Medicine) Newt Minion, MD as Consulting Physician (Orthopedic Surgery) Inda Castle, MD as Consulting Physician (Gastroenterology) Franchot Gallo, MD as Consulting Physician (Urology)  Past Surgical History  Procedure Laterality Date  . Tumor ear Left     age 46 topical growth behind left ear.  . Cystoscopy w/ ureteroscopy w/ lithotripsy    . Inguinal hernia repair      left  . Colonoscopy    . Upper gastrointestinal endoscopy    . Nephrolithotomy Left 02/01/2014    Procedure: NEPHROLITHOTOMY PERCUTANEOUS;  Surgeon: Jorja Loa, MD;  Location: [facility];  Service: Urology;  Laterality: Left;  . Parathyroidectomy Left 07/10/2014    Procedure: LEFT SUPERIOR PARATHYROIDECTOMY;  Surgeon: Armandina Gemma, MD;  Location: [facility];  Service: General;  Laterality: Left;   No family history on file. Social History  Substance Use Topics  . Smoking status: Former Smoker    Quit date: 03/02/1974  . Smokeless tobacco: None  . Alcohol Use: 4.2 oz/week    7 Glasses of wine per week    MEDICARE WELLNESS OBJECTIVES: Tobacco use: He does not smoke.  Patient is a former smoker. If yes, counseling given Alcohol Current alcohol use: social drinker Osteoporosis: low risk History of fracture in  the past year: no Fall risk: Low Risk Diet: in general, a "healthy" diet   Physical activity: Current Exercise Habits:: Home exercise routine, Type of exercise: walking, Time (Minutes): 20, Frequency (Times/Week): 2, Weekly Exercise (Minutes/Week): 40, Intensity: Mild Cardiac risk factors: Cardiac Risk Factors include: advanced age (>28men, >93 women);dyslipidemia;male gender;hypertension;obesity (BMI >30kg/m2);sedentary lifestyle Depression/mood screen:   Depression screen [facility] 2/9 03/25/2015  Decreased Interest 0  Down, Depressed, Hopeless 0  PHQ - 2 Score 0    ADLs:  In your present state of health, do you have any difficulty performing the following activities: 03/25/2015 07/03/2014  Hearing? N N  Vision? Y N  Difficulty concentrating or making decisions? N N  Walking or climbing stairs? N N  Dressing or bathing? N N  Doing errands, shopping? N N  Preparing Food and eating ? N -  Using the Toilet? N -  In the past six months, have you accidently leaked urine? N -  Do you have problems with loss of bowel control? N -  Managing your Medications? N -  Managing your Finances? N -  Housekeeping or managing your Housekeeping? N -     Cognitive Testing  Alert? Yes  Normal Appearance?Yes  Oriented to person? Yes  Place? Yes   Time? Yes  Recall of three objects?  Yes  Can perform simple calculations? Yes  Displays appropriate judgment?Yes  Can read the correct time from a watch face?Yes  EOL planning: Does patient have an advance directive?: Yes Type of Advance Directive: Healthcare Power of Attorney, Living will Does patient want to make changes to advanced directive?: No - Patient declined Copy of advanced directive(s) in chart?: No - copy requested   Objective:   Today's Vitals   03/25/15 1149  BP: 126/74  Pulse: 74  Temp: 98.6 F (37 C)  TempSrc: Temporal  Resp: 16  Height: 5\' 11"  (1.803 m)  Weight: 255 lb (115.667 kg)  SpO2: 94%  PainSc: 0-No pain   Body mass  index is 35.58 kg/(m^2).  General appearance: alert, no distress, WD/WN, male HEENT: normocephalic, sclerae anicteric, TMs pearly, nares patent, no discharge or erythema, pharynx normal Oral cavity: MMM, no lesions Neck: supple, no lymphadenopathy, no thyromegaly, no masses Heart: RRR, normal S1, S2, no murmurs Lungs: CTA bilaterally, no wheezes, rhonchi, or rales Abdomen: +bs, soft, non tender, non distended, no masses, no hepatomegaly, no splenomegaly Musculoskeletal: nontender, no swelling, no obvious deformity Extremities: no edema, no cyanosis, no clubbing Pulses: 2+ symmetric, upper and lower extremities, normal cap refill Neurological: alert, oriented x 3, CN2-12 intact, strength normal upper extremities and lower extremities, sensation normal throughout, DTRs 2+ throughout, no cerebellar signs, gait normal Psychiatric: normal affect, behavior normal, pleasant   Medicare Attestation I have personally reviewed: The patient's medical and  social history Their use of alcohol, tobacco or illicit drugs Their current medications and supplements The patient's functional ability including ADLs,fall risks, home safety risks, cognitive, and hearing and visual impairment Diet and physical activities Evidence for depression or mood disorders  The patient's weight, height, BMI, and visual acuity have been recorded in the chart.  I have made referrals, counseling, and provided education to the patient based on review of the above and I have provided the patient with a written personalized care plan for preventive services.     Vicie Mutters, PA-C   03/25/2015

## 2015-03-25 NOTE — Patient Instructions (Signed)
We want weight loss that will last so you should lose 1-2 pounds a week.  THAT IS IT! Please pick THREE things a month to change. Once it is a habit check off the item. Then pick another three items off the list to become habits.  If you are already doing a habit on the list GREAT!  Cross that item off! o Don't drink your calories. Ie, alcohol, soda, fruit juice, and sweet tea.  o Drink more water. Drink a glass when you feel hungry or before each meal.  o Eat breakfast - Complex carb and protein (likeDannon light and fit yogurt, oatmeal, fruit, eggs, [place] bacon).  Yogurt to try, dannon light and fit greek, oikos triple zero, chobani 100  o Measure your cereal.  Eat no more than one cup a day. (ie [place]) o Eat an apple a day. o Add a vegetable a day. o Try a new vegetable a month. o Use Pam! Stop using oil or butter to cook. o Don't finish your plate or use smaller plates. o Share your dessert. o Eat sugar free Jello for dessert or frozen grapes. o Don't eat 2-3 hours before bed. o Switch to whole wheat bread, pasta, and brown rice. o Make healthier choices when you eat out. No fries! o Pick baked chicken, NOT fried. o Don't forget to SLOW DOWN when you eat. It is not going anywhere.  o Take the stairs. o Park far away in the parking lot o [employer] (or weights) for 10 minutes while watching TV. o Walk at work for 10 minutes during break. o Walk outside 1 time a week with your friend, kids, dog, or significant other. o Start a walking group at [place] the mall as much as you can tolerate.  o Keep a food diary. o Weigh yourself daily. o Walk for 15 minutes 3 days per week. o Cook at home more often and eat out less.  If life happens and you go back to old habits, it is okay.  Just start over. You can do it!   If you experience chest pain, get short of breath, or tired during the exercise, please stop immediately and inform your doctor.    I think it is possible that  you have sleep apnea. It can cause interrupted sleep, headaches, frequent awakenings, fatigue, dry mouth, fast/slow heart beats, memory issues, anxiety/depression, swelling, numbness tingling hands/feet, weight gain, shortness of breath, and the list goes on. Sleep apnea needs to be ruled out because if it is left untreated it does eventually lead to abnormal heart beats, lung failure or heart failure as well as increasing the risk of heart attack and stroke. There are masks you can wear OR a mouth piece that I can give you information about. Often times though people feel MUCH better after getting treatment.   Sleep Apnea  Sleep apnea is a sleep disorder characterized by abnormal pauses in breathing while you sleep. When your breathing pauses, the level of oxygen in your blood decreases. This causes you to move out of deep sleep and into light sleep. As a result, your quality of sleep is poor, and the system that carries your blood throughout your body (cardiovascular system) experiences stress. If sleep apnea remains untreated, the following conditions can develop:  High blood pressure (hypertension).  Coronary artery disease.  Inability to achieve or maintain an erection (impotence).  Impairment of your thought process (cognitive dysfunction). There are three types of sleep  apnea: 1. Obstructive sleep apnea--Pauses in breathing during sleep because of a blocked airway. 2. Central sleep apnea--Pauses in breathing during sleep because the area of the brain that controls your breathing does not send the correct signals to the muscles that control breathing. 3. Mixed sleep apnea--A combination of both obstructive and central sleep apnea.  RISK FACTORS The following risk factors can increase your risk of developing sleep apnea:  Being overweight.  Smoking.  Having narrow passages in your nose and throat.  Being of older age.  Being male.  Alcohol use.  Sedative and tranquilizer use.   Ethnicity. Among individuals younger than 35 years, African Americans are at increased risk of sleep apnea. SYMPTOMS   Difficulty staying asleep.  Daytime sleepiness and fatigue.  Loss of energy.  Irritability.  Loud, heavy snoring.  Morning headaches.  Trouble concentrating.  Forgetfulness.  Decreased interest in sex. DIAGNOSIS  In order to diagnose sleep apnea, your caregiver will perform a physical examination. Your caregiver may suggest that you take a home sleep test. Your caregiver may also recommend that you spend the night in a sleep lab. In the sleep lab, several monitors record information about your heart, lungs, and brain while you sleep. Your leg and arm movements and blood oxygen level are also recorded. TREATMENT The following actions may help to resolve mild sleep apnea:  Sleeping on your side.   Using a decongestant if you have nasal congestion.   Avoiding the use of depressants, including alcohol, sedatives, and narcotics.   Losing weight and modifying your diet if you are overweight. There also are devices and treatments to help open your airway:  Oral appliances. These are custom-made mouthpieces that shift your lower jaw forward and slightly open your bite. This opens your airway.  Devices that create positive airway pressure. This positive pressure "splints" your airway open to help you breathe better during sleep. The following devices create positive airway pressure:  Continuous positive airway pressure (CPAP) device. The CPAP device creates a continuous level of air pressure with an air pump. The air is delivered to your airway through a mask while you sleep. This continuous pressure keeps your airway open.  Nasal expiratory positive airway pressure (EPAP) device. The EPAP device creates positive air pressure as you exhale. The device consists of single-use valves, which are inserted into each nostril and held in place by adhesive. The valves create  very little resistance when you inhale but create much more resistance when you exhale. That increased resistance creates the positive airway pressure. This positive pressure while you exhale keeps your airway open, making it easier to breath when you inhale again.  Bilevel positive airway pressure (BPAP) device. The BPAP device is used mainly in patients with central sleep apnea. This device is similar to the CPAP device because it also uses an air pump to deliver continuous air pressure through a mask. However, with the BPAP machine, the pressure is set at two different levels. The pressure when you exhale is lower than the pressure when you inhale.  Surgery. Typically, surgery is only done if you cannot comply with less invasive treatments or if the less invasive treatments do not improve your condition. Surgery involves removing excess tissue in your airway to create a wider passage way. Document Released: 02/06/2002 Document Revised: 06/13/2012 Document Reviewed: 06/25/2011 [facility] Patient Information 2015 [place], [place]. This information is not intended to replace advice given to you by your health care provider. Make sure you discuss any questions  you have with your health care provider.

## 2015-03-26 LAB — VITAMIN D 25 HYDROXY (VIT D DEFICIENCY, FRACTURES): Vit D, 25-Hydroxy: 69 ng/mL (ref 30–100)

## 2015-04-08 ENCOUNTER — Institutional Professional Consult (permissible substitution): Payer: Medicare Other | Admitting: Neurology

## 2015-04-22 ENCOUNTER — Other Ambulatory Visit: Payer: Self-pay | Admitting: Internal Medicine

## 2015-04-29 DIAGNOSIS — L821 Other seborrheic keratosis: Secondary | ICD-10-CM | POA: Diagnosis not present

## 2015-05-03 ENCOUNTER — Encounter: Payer: Self-pay | Admitting: Internal Medicine

## 2015-06-06 ENCOUNTER — Encounter: Payer: Self-pay | Admitting: Internal Medicine

## 2015-06-27 ENCOUNTER — Ambulatory Visit (INDEPENDENT_AMBULATORY_CARE_PROVIDER_SITE_OTHER): Payer: Medicare Other | Admitting: Internal Medicine

## 2015-06-27 ENCOUNTER — Encounter: Payer: Self-pay | Admitting: Internal Medicine

## 2015-06-27 VITALS — BP 132/74 | HR 60 | Temp 97.3°F | Resp 16 | Ht 71.0 in | Wt 254.2 lb

## 2015-06-27 DIAGNOSIS — R7303 Prediabetes: Secondary | ICD-10-CM

## 2015-06-27 DIAGNOSIS — Z0000 Encounter for general adult medical examination without abnormal findings: Secondary | ICD-10-CM | POA: Diagnosis not present

## 2015-06-27 DIAGNOSIS — K219 Gastro-esophageal reflux disease without esophagitis: Secondary | ICD-10-CM

## 2015-06-27 DIAGNOSIS — R7309 Other abnormal glucose: Secondary | ICD-10-CM | POA: Diagnosis not present

## 2015-06-27 DIAGNOSIS — Z136 Encounter for screening for cardiovascular disorders: Secondary | ICD-10-CM

## 2015-06-27 DIAGNOSIS — E559 Vitamin D deficiency, unspecified: Secondary | ICD-10-CM | POA: Diagnosis not present

## 2015-06-27 DIAGNOSIS — Z0001 Encounter for general adult medical examination with abnormal findings: Secondary | ICD-10-CM

## 2015-06-27 DIAGNOSIS — Z79899 Other long term (current) drug therapy: Secondary | ICD-10-CM | POA: Diagnosis not present

## 2015-06-27 DIAGNOSIS — I10 Essential (primary) hypertension: Secondary | ICD-10-CM

## 2015-06-27 DIAGNOSIS — Z125 Encounter for screening for malignant neoplasm of prostate: Secondary | ICD-10-CM

## 2015-06-27 DIAGNOSIS — E785 Hyperlipidemia, unspecified: Secondary | ICD-10-CM | POA: Diagnosis not present

## 2015-06-27 DIAGNOSIS — R6889 Other general symptoms and signs: Secondary | ICD-10-CM | POA: Diagnosis not present

## 2015-06-27 DIAGNOSIS — Z1212 Encounter for screening for malignant neoplasm of rectum: Secondary | ICD-10-CM

## 2015-06-27 LAB — CBC WITH DIFFERENTIAL/PLATELET
Basophils Absolute: 0 cells/uL (ref 0–200)
Basophils Relative: 0 %
Eosinophils Absolute: 225 cells/uL (ref 15–500)
Eosinophils Relative: 3 %
HCT: 47.1 % (ref 38.5–50.0)
Hemoglobin: 16.3 g/dL (ref 13.2–17.1)
Lymphocytes Relative: 26 %
Lymphs Abs: 1950 cells/uL (ref 850–3900)
MCH: 31.8 pg (ref 27.0–33.0)
MCHC: 34.6 g/dL (ref 32.0–36.0)
MCV: 92 fL (ref 80.0–100.0)
MPV: 10 fL (ref 7.5–12.5)
Monocytes Absolute: 675 cells/uL (ref 200–950)
Monocytes Relative: 9 %
Neutro Abs: 4650 cells/uL (ref 1500–7800)
Neutrophils Relative %: 62 %
Platelets: 261 10*3/uL (ref 140–400)
RBC: 5.12 MIL/uL (ref 4.20–5.80)
RDW: 13.2 % (ref 11.0–15.0)
WBC: 7.5 10*3/uL (ref 3.8–10.8)

## 2015-06-27 LAB — BASIC METABOLIC PANEL WITH GFR
BUN: 25 mg/dL (ref 7–25)
CO2: 23 mmol/L (ref 20–31)
Calcium: 10.1 mg/dL (ref 8.6–10.3)
Chloride: 102 mmol/L (ref 98–110)
Creat: 1.61 mg/dL — ABNORMAL HIGH (ref 0.70–1.18)
GFR, Est African American: 49 mL/min — ABNORMAL LOW (ref >=60)
GFR, Est Non African American: 42 mL/min — ABNORMAL LOW (ref >=60)
Glucose, Bld: 89 mg/dL (ref 65–99)
Potassium: 4.5 mmol/L (ref 3.5–5.3)
Sodium: 136 mmol/L (ref 135–146)

## 2015-06-27 LAB — HEPATIC FUNCTION PANEL
ALT: 30 U/L (ref 9–46)
AST: 30 U/L (ref 10–35)
Albumin: 4.5 g/dL (ref 3.6–5.1)
Alkaline Phosphatase: 25 U/L — ABNORMAL LOW (ref 40–115)
Bilirubin, Direct: 0.2 mg/dL (ref <=0.2)
Indirect Bilirubin: 0.7 mg/dL (ref 0.2–1.2)
Total Bilirubin: 0.9 mg/dL (ref 0.2–1.2)
Total Protein: 7.1 g/dL (ref 6.1–8.1)

## 2015-06-27 LAB — LIPID PANEL
Cholesterol: 193 mg/dL (ref 125–200)
HDL: 55 mg/dL (ref >=40)
LDL Cholesterol: 111 mg/dL (ref <130)
Total CHOL/HDL Ratio: 3.5 Ratio (ref <=5.0)
Triglycerides: 136 mg/dL (ref <150)
VLDL: 27 mg/dL (ref <30)

## 2015-06-27 LAB — HEMOGLOBIN A1C
Hgb A1c MFr Bld: 5.8 % — ABNORMAL HIGH (ref <5.7)
Mean Plasma Glucose: 120 mg/dL

## 2015-06-27 LAB — MAGNESIUM: Magnesium: 1.8 mg/dL (ref 1.5–2.5)

## 2015-06-27 LAB — TSH: TSH: 2.33 mIU/L (ref 0.40–4.50)

## 2015-06-27 NOTE — Patient Instructions (Signed)

## 2015-06-27 NOTE — Progress Notes (Signed)
Patient ID: [name] [name], male   DOB: Aug 09, 1940, 75 y.o.   MRN: [id number]  Annual  Screening/Preventative Visit And Comprehensive Evaluation & Examination  This very nice 75 y.o. MWM presents for a Wellness/Preventative Visit & comprehensive evaluation and management of multiple medical co-morbidities.  Patient has been followed for HTN, Prediabetes, Hyperlipidemia and Vitamin D Deficiency.   HTN predates since 2002. Patient's BP has been controlled at home.Today's BP: 132/74 mmHg. Patient denies any cardiac symptoms as chest pain, palpitations, shortness of breath, dizziness or ankle swelling. In May 2016, patient underwent a L Superior Parathyroidectomy.    Patient's hyperlipidemia is controlled with diet and medications. Patient denies myalgias or other medication SE's. Last lipids were at goal with Cholesterol 177; HDL 50; LDL 101; Triglycerides 132 on 03/25/2015.   Patient has Morbid Obesity (BMI 35+) and consequent prediabetes since 2010 with A1c 6.4% and elevated Insulin 124.  Patient denies reactive hypoglycemic symptoms, visual blurring, diabetic polys or paresthesias. Last A1c was 6.0% on 03/25/2015   Finally, patient has history of Vitamin D Deficiency of of "36" in 2009 and last vitamin D was 69 on 03/25/2015.  Medication Sig  . aspirin 81 MG tablet Take 81 mg by mouth daily.  Marland Kitchen VITAMIN-B COMPLEX  Take 1 capsule by mouth every evening. Vegetable formula  . bisoprolol-hctz 5-6.25 MG  TAKE 1 TABLET BY MOUTH EVERY MORNING FOR BLOOD PRESSURE  . VITAMIN D 2000 UNITS tablet Take 4,000-6,000 Units by mouth daily. Three capsules in the morning = 6000 units and two capsules in the evening= 4000 units for a total of 10,000 mg units.  . fenofibrate  145 MG tablet TAKE 1 TABLET DAILY FOR TRIGLYCERIDES / BLOOD FATS  . FLAXSEED OIL  Take 1 capsule by mouth every morning. 1000mg   . losartan  100 MG tablet TAKE 1 TABLET BY MOUTH DAILY FOR BLOOD PRESSURE.  . Magnesium 250 MG TABS Take 250 mg by mouth  every morning.   . meloxicam  15 MG tablet TAKE 1 TABLET BY MOUTH DAILY.  Marland Kitchen OMEGA-3 FISH OIL 1200 MG  Take 1,200 mg by mouth 2 (two) times daily.  Marland Kitchen omeprazole  40 MG capsule TAKE 1 CAPSULE BY MOUTH DAILY.  . tamsulosin  0.4 MG CAPS    . terbinafine  250 MG tablet Take 250 mg by mouth daily.   Allergies  Allergen Reactions  . Iohexol Shortness Of Breath  . Ace Inhibitors     cough   Past Medical History  Diagnosis Date  . Hyperlipidemia   . Hypertension   . GERD (gastroesophageal reflux disease)   . Obese   . History of kidney stones     hx. multiple kidney stones  . Asthma     childhood yrs  only  . Arthritis     right foot  . COPD (chronic obstructive pulmonary disease) ([place])   . Hypoparathyroidism [facility])    Health Maintenance  Topic Date Due  . TETANUS/TDAP  03/03/2015  . INFLUENZA VACCINE  10/01/2015  . COLONOSCOPY  12/15/2016  . ZOSTAVAX  Completed  . PNA vac Low Risk Adult  Completed   Immunization History  Administered Date(s) Administered  . Influenza Split 12/27/2012  . Influenza, High Dose Seasonal PF 01/31/2014, 11/27/2014  . Pneumococcal Conjugate-13 03/25/2015  . Pneumococcal Polysaccharide-23 03/02/2008  . Td 03/02/2005  . Zoster 03/02/2006   Past Surgical History  Procedure Laterality Date  . Tumor earage 4 topical growth behind left ear. Left   . Cystoscopy  w/ ureteroscopy w/ lithotripsy    . Inguinal hernia repair, left    . Colonoscopy    . Upper gastrointestinal endoscopy    . Nephrolithotomy Left 02/01/2014    Left; NEPHROLITHOTOMY PERCUTANEOUS;   Jorja Loa, MD   . Parathyroidectomy Left 07/10/2014     LEFT SUPERIOR PARATHYROIDECTOMY;   Armandina Gemma, MD   History reviewed. No pertinent family history.  Social History   Social History  . Marital Status: Married    Spouse Name: N/A  . Number of Children: N/A  . Years of Education: N/A   Occupational History  . Not on file.   Social History Main Topics  . Smoking status:  Former Smoker    Quit date: 03/02/1974  . Smokeless tobacco: Not on file  . Alcohol Use: 4.2 oz/week    7 Glasses of wine per week  . Drug Use: No  . Sexual Activity: Yes    ROS Constitutional: Denies fever, chills, weight loss/gain, headaches, insomnia,  night sweats or change in appetite. Does c/o fatigue. Eyes: Denies redness, blurred vision, diplopia, discharge, itchy or watery eyes.  ENT: Denies discharge, congestion, post nasal drip, epistaxis, sore throat, earache, hearing loss, dental pain, Tinnitus, Vertigo, Sinus pain or snoring.  Cardio: Denies chest pain, palpitations, irregular heartbeat, syncope, dyspnea, diaphoresis, orthopnea, PND, claudication or edema Respiratory: denies cough, dyspnea, DOE, pleurisy, hoarseness, laryngitis or wheezing.  Gastrointestinal: Denies dysphagia, heartburn, reflux, water brash, pain, cramps, nausea, vomiting, bloating, diarrhea, constipation, hematemesis, melena, hematochezia, jaundice or hemorrhoids Genitourinary: Denies dysuria, frequency, urgency, nocturia, hesitancy, discharge, hematuria or flank pain Musculoskeletal: Denies arthralgia, myalgia, stiffness, Jt. Swelling, pain, limp or strain/sprain. Denies Falls. Skin: Denies puritis, rash, hives, warts, acne, eczema or change in skin lesion Neuro: No weakness, tremor, incoordination, spasms, paresthesia or pain Psychiatric: Denies confusion, memory loss or sensory loss. Denies Depression. Endocrine: Denies change in weight, skin, hair change, nocturia, and paresthesia, diabetic polys, visual blurring or hyper / hypo glycemic episodes.  Heme/Lymph: No excessive bleeding, bruising or enlarged lymph nodes.  Physical Exam  BP 132/74 mmHg  Pulse 60  Temp(Src) 97.3 F (36.3 C)  Resp 16  Ht 5\' 11"  (1.803 m)  Wt 254 lb 3.2 oz (115.304 kg)  BMI 35.47 kg/m2  SpO2 96%  General Appearance: Well nourished, in no apparent distress. Eyes: PERRLA, EOMs, conjunctiva no swelling or erythema, normal  fundi and vessels. Sinuses: No frontal/maxillary tenderness ENT/Mouth: EACs patent / TMs  nl. Nares clear without erythema, swelling, mucoid exudates. Oral hygiene is good. No erythema, swelling, or exudate. Tongue normal, non-obstructing. Tonsils not swollen or erythematous. Hearing normal.  Neck: Supple, thyroid normal. No bruits, nodes or JVD. Respiratory: Respiratory effort normal.  BS equal and clear bilateral without rales, rhonci, wheezing or stridor. Cardio: Heart sounds are normal with regular rate and rhythm and no murmurs, rubs or gallops. Peripheral pulses are normal and equal bilaterally without edema. No aortic or femoral bruits. Chest: symmetric with normal excursions and percussion.  Abdomen: Soft, with Nl bowel sounds. Nontender, no guarding, rebound, hernias, masses, or organomegaly.  Lymphatics: Non tender without lymphadenopathy.  Genitourinary: Deferred to Dr Eulogio Ditch Musculoskeletal: Full ROM all peripheral extremities, joint stability, 5/5 strength, and normal gait. Skin: Warm and dry without rashes, lesions, cyanosis, clubbing or  ecchymosis.  Neuro: Cranial nerves intact, reflexes equal bilaterally. Normal muscle tone, no cerebellar symptoms. Sensation intact.  Pysch: Alert and oriented X 3 with normal affect, insight and judgment appropriate.   Assessment and Plan  1.  Annual Preventative/Screening Exam   - Microalbumin / creatinine urine ratio - EKG 12-Lead - [place], RETROPERITNL ABD,  LTD - POC Hemoccult Bld/Stl  - Urinalysis, Routine w reflex microscopic  - PSA - CBC with Differential/Platelet - BASIC METABOLIC PANEL WITH GFR - Hepatic function panel - Magnesium - Lipid panel - TSH - Hemoglobin A1c - Insulin, random - VITAMIN D 25 Hydroxy   2. Essential hypertension  - Microalbumin / creatinine urine ratio - EKG 12-Lead - [place], RETROPERITNL ABD,  LTD - TSH  3. Hyperlipidemia  - Lipid panel - TSH  4. Prediabetes  - Hemoglobin A1c - Insulin,  random  5. Vitamin D deficiency  - VITAMIN D 25 Hydroxy  6. Morbid obesity due to excess calories ([place])   7. Gastroesophageal reflux disease   8. Screening for rectal cancer  - POC Hemoccult Bld/Stl   9. Prostate cancer screening  - PSA  10. Medication management  - CBC with Differential/Platelet - BASIC METABOLIC PANEL WITH GFR - Hepatic function panel - Magnesium  11. Screening for AAA (aortic abdominal aneurysm)  - Ao U/S  12. Screening for ischemic heart disease  - EKG   Continue prudent diet as discussed, weight control, BP monitoring, regular exercise, and medications as discussed.  Discussed med effects and SE's. Routine screening labs and tests as requested with regular follow-up as recommended. Over 40 minutes of exam, counseling, chart review and high complex critical decision making was performed

## 2015-06-28 ENCOUNTER — Other Ambulatory Visit: Payer: Self-pay | Admitting: *Deleted

## 2015-06-28 DIAGNOSIS — H10413 Chronic giant papillary conjunctivitis, bilateral: Secondary | ICD-10-CM | POA: Diagnosis not present

## 2015-06-28 DIAGNOSIS — H01022 Squamous blepharitis right lower eyelid: Secondary | ICD-10-CM | POA: Diagnosis not present

## 2015-06-28 DIAGNOSIS — H01021 Squamous blepharitis right upper eyelid: Secondary | ICD-10-CM | POA: Diagnosis not present

## 2015-06-28 DIAGNOSIS — H01024 Squamous blepharitis left upper eyelid: Secondary | ICD-10-CM | POA: Diagnosis not present

## 2015-06-28 DIAGNOSIS — H0289 Other specified disorders of eyelid: Secondary | ICD-10-CM | POA: Diagnosis not present

## 2015-06-28 LAB — VITAMIN D 25 HYDROXY (VIT D DEFICIENCY, FRACTURES): Vit D, 25-Hydroxy: 64 ng/mL (ref 30–100)

## 2015-06-28 LAB — MICROALBUMIN / CREATININE URINE RATIO
Creatinine, Urine: 120 mg/dL (ref 20–370)
Microalb Creat Ratio: 3 mcg/mg creat (ref <30)
Microalb, Ur: 0.4 mg/dL

## 2015-06-28 LAB — INSULIN, RANDOM: Insulin: 10.5 u[IU]/mL (ref 2.0–19.6)

## 2015-06-28 LAB — PSA: PSA: 3.22 ng/mL (ref <=4.00)

## 2015-06-28 MED ORDER — ONETOUCH ULTRASOFT LANCETS MISC: Status: DC

## 2015-06-28 MED ORDER — ONETOUCH VERIO W/DEVICE KIT: 1.0000 | PACK | Freq: Every day | Status: DC

## 2015-06-28 MED ORDER — GLUCOSE BLOOD VI STRP: ORAL_STRIP | Status: DC

## 2015-08-05 ENCOUNTER — Other Ambulatory Visit: Payer: Self-pay | Admitting: *Deleted

## 2015-08-05 DIAGNOSIS — Z0001 Encounter for general adult medical examination with abnormal findings: Secondary | ICD-10-CM

## 2015-08-05 DIAGNOSIS — Z1212 Encounter for screening for malignant neoplasm of rectum: Secondary | ICD-10-CM

## 2015-08-05 LAB — POC HEMOCCULT BLD/STL (HOME/3-CARD/SCREEN)
Card #2 Fecal Occult Blod, POC: NEGATIVE
Card #3 Fecal Occult Blood, POC: NEGATIVE
Fecal Occult Blood, POC: NEGATIVE

## 2015-08-12 ENCOUNTER — Ambulatory Visit: Payer: Medicare Other | Admitting: *Deleted

## 2015-08-13 NOTE — Progress Notes (Signed)
Patient ID: [name] [name], male   DOB: 07-27-1940, 75 y.o.   MRN: [id number] Patient was scheduled as a NV in error.

## 2015-09-24 ENCOUNTER — Ambulatory Visit (INDEPENDENT_AMBULATORY_CARE_PROVIDER_SITE_OTHER): Payer: Medicare Other | Admitting: Internal Medicine

## 2015-09-24 ENCOUNTER — Encounter: Payer: Self-pay | Admitting: Internal Medicine

## 2015-09-24 VITALS — BP 116/72 | HR 60 | Temp 97.4°F | Resp 16 | Ht 71.0 in | Wt 254.0 lb

## 2015-09-24 DIAGNOSIS — M5442 Lumbago with sciatica, left side: Secondary | ICD-10-CM

## 2015-09-24 DIAGNOSIS — Z23 Encounter for immunization: Secondary | ICD-10-CM

## 2015-09-24 MED ORDER — PREDNISONE 20 MG PO TABS: ORAL_TABLET | ORAL | 0 refills | Status: DC

## 2015-09-24 NOTE — Patient Instructions (Signed)

## 2015-09-24 NOTE — Progress Notes (Signed)
Subjective:    Patient ID: [name] [name], male    DOB: [birth year], 75 y.o.   MRN: [id number]  HPI This nice 75 yo MWM presents with LBP asymmetric to the Left and Left sciatica Denies recent injury or changes w/bowel or bladder.   Medication Sig  . aspirin 81 MG tablet Take 81 mg by mouth daily.  . B Complex Vitamins (VITAMIN-B COMPLEX PO) Take 1 capsule by mouth every evening. Vegetable formula  . bisoprolol-hydrochlorothiazide (ZIAC) 5-6.25 MG per tablet TAKE 1 TABLET BY MOUTH EVERY MORNING FOR BLOOD PRESSURE  . Blood Glucose Monitoring Suppl (ONETOUCH VERIO) w/Device KIT 1 kit by Does not apply route daily. Check blood sugar 1 time  Daily-DX-R73.03  . Cholecalciferol (VITAMIN D) 2000 UNITS tablet Take 4,000-6,000 Units by mouth daily. Three capsules in the morning = 6000 units and two capsules in the evening= 4000 units for a total of 10,000 mg units.  . fenofibrate (TRICOR) 145 MG tablet TAKE 1 TABLET DAILY FOR TRIGLYCERIDES / BLOOD FATS  . Flaxseed, Linseed, (FLAXSEED OIL PO) Take 1 capsule by mouth every morning. 1046m  . glucose blood (ONETOUCH VERIO) test strip check blood sugar 1 time daily-DX-R73.03  . Lancets (ONETOUCH ULTRASOFT) lancets Check blood sugar 1 time daily-DX-R73.03  . losartan (COZAAR) 100 MG tablet TAKE 1 TABLET BY MOUTH DAILY FOR BLOOD PRESSURE.  . Magnesium 250 MG TABS Take 250 mg by mouth every morning.   . meloxicam (MOBIC) 15 MG tablet TAKE 1 TABLET BY MOUTH DAILY.  .Marland KitchenOmega-3 Fatty Acids (OMEGA-3 FISH OIL) 1200 MG CAPS Take 1,200 mg by mouth 2 (two) times daily.  .Marland Kitchenomeprazole (PRILOSEC) 40 MG capsule TAKE 1 CAPSULE BY MOUTH DAILY.  . tamsulosin (FLOMAX) 0.4 MG CAPS capsule    Allergies  Allergen Reactions  . Iohexol Shortness Of Breath  . Ace Inhibitors     cough   Past Medical History:  Diagnosis Date  . Arthritis    right foot  . Asthma    childhood yrs  only  . COPD (chronic obstructive pulmonary disease) ([place]   . GERD (gastroesophageal reflux  disease)   . History of kidney stones    hx. multiple kidney stones  . Hyperlipidemia   . Hypertension   . [place]   . Obese    Review of Systems  10 point systems review negative except as above.    Objective:   Physical Exam  BP 116/72   Pulse 60   Temp 97.4 F (36.3 C)   Resp 16   Ht 5' 11"  (1.803 m)   Wt 254 lb (115.2 kg)   BMI 35.43 kg/m   HEENT - Eac's patent. TM's Nl. EOM's full. PERRLA. NasoOroPharynx clear. Neck - supple. Nl Thyroid. Carotids 2+ & No bruits, nodes, JVD Chest - Clear equal BS w/o Rales, rhonchi, wheezes. Cor - Nl HS. RRR w/o sig MGR. PP 1(+). No edema. Abd - Soft , benign. MS- FROM w/o deformities. Muscle power, tone and bulk Nl. Gait Nl. (+) SLR on the Left.  Neuro - No obvious Cr N abnormalities. Sensory, motor and Cerebellar functions appear Nl w/o focal abnormalities.    Assessment & Plan:   1. Lumbago with sciatica, left side  - predniSONE (DELTASONE) 20 MG tablet; 1 tab 3 x day for 3 days, then 1 tab 2 x day for 3 days, then 1 tab 1 x day for 5 days  Dispense: 20 tablet; Refill: 0  - discussed meds/SE's and stretching exercises.  2. Need for prophylactic vaccination with tetanus-diphtheria (TD)  - Td : Tetanus/diphtheria >7yo Preservative  free

## 2015-10-09 ENCOUNTER — Encounter: Payer: Self-pay | Admitting: Internal Medicine

## 2015-10-09 ENCOUNTER — Ambulatory Visit (INDEPENDENT_AMBULATORY_CARE_PROVIDER_SITE_OTHER): Payer: Medicare Other | Admitting: Internal Medicine

## 2015-10-09 VITALS — BP 102/60 | HR 52 | Temp 98.2°F | Resp 16 | Ht 71.0 in | Wt 252.0 lb

## 2015-10-09 DIAGNOSIS — E559 Vitamin D deficiency, unspecified: Secondary | ICD-10-CM

## 2015-10-09 DIAGNOSIS — Z79899 Other long term (current) drug therapy: Secondary | ICD-10-CM | POA: Diagnosis not present

## 2015-10-09 DIAGNOSIS — N183 Chronic kidney disease, stage 3 unspecified: Secondary | ICD-10-CM

## 2015-10-09 DIAGNOSIS — I10 Essential (primary) hypertension: Secondary | ICD-10-CM | POA: Diagnosis not present

## 2015-10-09 DIAGNOSIS — E785 Hyperlipidemia, unspecified: Secondary | ICD-10-CM

## 2015-10-09 DIAGNOSIS — E210 Primary hyperparathyroidism: Secondary | ICD-10-CM | POA: Diagnosis not present

## 2015-10-09 DIAGNOSIS — R7303 Prediabetes: Secondary | ICD-10-CM

## 2015-10-09 DIAGNOSIS — M25552 Pain in left hip: Secondary | ICD-10-CM

## 2015-10-09 DIAGNOSIS — N200 Calculus of kidney: Secondary | ICD-10-CM | POA: Diagnosis not present

## 2015-10-09 LAB — CBC WITH DIFFERENTIAL/PLATELET
Basophils Absolute: 86 cells/uL (ref 0–200)
Basophils Relative: 1 %
Eosinophils Absolute: 344 cells/uL (ref 15–500)
Eosinophils Relative: 4 %
HCT: 47.1 % (ref 38.5–50.0)
Hemoglobin: 16.2 g/dL (ref 13.2–17.1)
Lymphocytes Relative: 17 %
Lymphs Abs: 1462 cells/uL (ref 850–3900)
MCH: 32 pg (ref 27.0–33.0)
MCHC: 34.4 g/dL (ref 32.0–36.0)
MCV: 93.1 fL (ref 80.0–100.0)
MPV: 10.3 fL (ref 7.5–12.5)
Monocytes Absolute: 946 cells/uL (ref 200–950)
Monocytes Relative: 11 %
Neutro Abs: 5762 cells/uL (ref 1500–7800)
Neutrophils Relative %: 67 %
Platelets: 234 10*3/uL (ref 140–400)
RBC: 5.06 MIL/uL (ref 4.20–5.80)
RDW: 13.3 % (ref 11.0–15.0)
WBC: 8.6 10*3/uL (ref 3.8–10.8)

## 2015-10-09 LAB — TSH: TSH: 1.98 mIU/L (ref 0.40–4.50)

## 2015-10-09 MED ORDER — GABAPENTIN 100 MG PO CAPS: 100.0000 mg | ORAL_CAPSULE | Freq: Three times a day (TID) | ORAL | 0 refills | Status: DC

## 2015-10-09 NOTE — Progress Notes (Signed)
Assessment and Plan:  Hypertension:  -Continue medication,  -monitor blood pressure at home.  -Continue DASH diet.   -Reminder to go to the ER if any CP, SOB, nausea, dizziness, severe HA, changes vision/speech, left arm numbness and tingling, and jaw pain.  Cholesterol: -Continue diet and exercise.  -Check cholesterol.   Pre-diabetes: -Continue diet and exercise.  -Check A1C  Vitamin D Def: -continue medications.   Morbid obesity -cont diet and exercise  Hyperparathyroidism -cont TSH -cont Vit D  Left Hip pain -gabapentin -cont mobic -cont likely hip arthritis -referral to ortho  Continue diet and meds as discussed. Further disposition pending results of labs. HPI 75 y.o. male  presents for 3 month follow up with hypertension, hyperlipidemia, prediabetes and vitamin D.   His blood pressure has been controlled at home, today their BP is BP: 102/60.   He does workout. He denies chest pain, shortness of breath, dizziness.     He is on cholesterol medication and denies myalgias. His cholesterol is at goal. The cholesterol last visit was:   Lab Results  Component Value Date   CHOL 193 06/27/2015   HDL 55 06/27/2015   LDLCALC 111 06/27/2015   TRIG 136 06/27/2015   CHOLHDL 3.5 06/27/2015     He has been working on diet and exercise for prediabetes, and denies foot ulcerations, hyperglycemia, hypoglycemia , increased appetite, nausea, paresthesia of the feet, polydipsia, polyuria, visual disturbances, vomiting and weight loss. Last A1C in the office was:   Lab Results  Component Value Date   HGBA1C 5.8 (H) 06/27/2015    Patient is on Vitamin D supplement.  Lab Results  Component Value Date   VD25OH 25 06/27/2015      He reports that he was recently here for sciatica.  He reports that he started taking prednisone and it ran his blood sugars up.  He reports that his left groin bothers him and radiates to her left buttock.  He does not have numbness or weakness.   Prednisone helped for 3 days but then wasn't helping anymore.    Current Medications:  Current Outpatient Prescriptions on File Prior to Visit  Medication Sig Dispense Refill  . aspirin 81 MG tablet Take 81 mg by mouth daily.    . B Complex Vitamins (VITAMIN-B COMPLEX PO) Take 1 capsule by mouth every evening. Vegetable formula    . bisoprolol-hydrochlorothiazide (ZIAC) 5-6.25 MG per tablet TAKE 1 TABLET BY MOUTH EVERY MORNING FOR BLOOD PRESSURE 90 tablet 3  . Blood Glucose Monitoring Suppl (ONETOUCH VERIO) w/Device KIT 1 kit by Does not apply route daily. Check blood sugar 1 time  Daily-DX-R73.03 1 kit 0  . Cholecalciferol (VITAMIN D) 2000 UNITS tablet Take 4,000-6,000 Units by mouth daily. Three capsules in the morning = 6000 units and two capsules in the evening= 4000 units for a total of 10,000 mg units.    . fenofibrate (TRICOR) 145 MG tablet TAKE 1 TABLET DAILY FOR TRIGLYCERIDES / BLOOD FATS 90 tablet 2  . Flaxseed, Linseed, (FLAXSEED OIL PO) Take 1 capsule by mouth every morning. 1011m    . glucose blood (ONETOUCH VERIO) test strip check blood sugar 1 time daily-DX-R73.03 100 each 6  . Lancets (ONETOUCH ULTRASOFT) lancets Check blood sugar 1 time daily-DX-R73.03 100 each 6  . losartan (COZAAR) 100 MG tablet TAKE 1 TABLET BY MOUTH DAILY FOR BLOOD PRESSURE. 90 tablet 1  . Magnesium 250 MG TABS Take 250 mg by mouth every morning.     . meloxicam (  MOBIC) 15 MG tablet TAKE 1 TABLET BY MOUTH DAILY. 90 tablet 11  . Omega-3 Fatty Acids (OMEGA-3 FISH OIL) 1200 MG CAPS Take 1,200 mg by mouth 2 (two) times daily.    Marland Kitchen omeprazole (PRILOSEC) 40 MG capsule TAKE 1 CAPSULE BY MOUTH DAILY. 90 capsule 3  . tamsulosin (FLOMAX) 0.4 MG CAPS capsule   3   No current facility-administered medications on file prior to visit.     Medical History:  Past Medical History:  Diagnosis Date  . Arthritis    right foot  . Asthma    childhood yrs  only  . COPD (chronic obstructive pulmonary disease) ([place])   .  GERD (gastroesophageal reflux disease)   . History of kidney stones    hx. multiple kidney stones  . Hyperlipidemia   . Hypertension   . Hypoparathyroidism ([place])   . Obese     Allergies:  Allergies  Allergen Reactions  . Iohexol Shortness Of Breath  . Ace Inhibitors     cough     Review of Systems:  Review of Systems  Constitutional: Negative for chills, fever and malaise/fatigue.  HENT: Negative for congestion, ear pain and sore throat.   Eyes: Negative.   Respiratory: Negative for cough, shortness of breath and wheezing.   Cardiovascular: Negative for chest pain, palpitations and leg swelling.  Gastrointestinal: Negative for abdominal pain, blood in stool, constipation, diarrhea, heartburn and melena.  Genitourinary: Negative.   Musculoskeletal: Positive for joint pain. Negative for back pain.  Skin: Negative.   Neurological: Negative for dizziness, sensory change, loss of consciousness and headaches.  Psychiatric/Behavioral: Negative for depression. The patient is not nervous/anxious and does not have insomnia.     Family history- Review and unchanged  Social history- Review and unchanged  Physical Exam: BP 102/60   Pulse (!) 52   Temp 98.2 F (36.8 C) (Temporal)   Resp 16   Ht _0  (1.803 m)   Wt 252 lb (114.3 kg)   BMI 35.15 kg/m  Wt Readings from Last 3 Encounters:  10/09/15 252 lb (114.3 kg)  09/24/15 254 lb (115.2 kg)  06/27/15 254 lb 3.2 oz (115.3 kg)    General Appearance: Well nourished well developed, in no apparent distress. Eyes: PERRLA, EOMs, conjunctiva no swelling or erythema ENT/Mouth: Ear canals normal without obstruction, swelling, erythma, discharge.  TMs normal bilaterally.  Oropharynx moist, clear, without exudate, or postoropharyngeal swelling. Neck: Supple, thyroid normal,no cervical adenopathy  Respiratory: Respiratory effort normal, Breath sounds clear A&P without rhonchi, wheeze, or rale.  No retractions, no accessory  usage. Cardio: RRR with no MRGs. Brisk peripheral pulses without edema.  Abdomen: Soft, + BS,  Non tender, no guarding, rebound, hernias, masses. Musculoskeletal: Full ROM, 5/5 strength, Normal gait irritability of the left hip.   Skin: Warm, dry without rashes, lesions, ecchymosis.  Neuro: Awake and oriented X 3, Cranial nerves intact. Normal muscle tone, no cerebellar symptoms. Psych: Normal affect, Insight and Judgment appropriate.    Starlyn Skeans, PA-C 10:52 AM [facility]

## 2015-10-09 NOTE — Patient Instructions (Signed)
Gabapentin capsules or tablets What is this medicine? GABAPENTIN (GA ba pen tin) is used to control partial seizures in adults with epilepsy. It is also used to treat certain types of nerve pain. This medicine may be used for other purposes; ask your health care provider or pharmacist if you have questions. What should I tell my health care provider before I take this medicine? They need to know if you have any of these conditions: -kidney disease -suicidal thoughts, plans, or attempt; a previous suicide attempt by you or a family member -an unusual or allergic reaction to gabapentin, other medicines, foods, dyes, or preservatives -pregnant or trying to get pregnant -breast-feeding How should I use this medicine? Take this medicine by mouth with a glass of water. Follow the directions on the prescription label. You can take it with or without food. If it upsets your stomach, take it with food.Take your medicine at regular intervals. Do not take it more often than directed. Do not stop taking except on your doctor's advice. If you are directed to break the 600 or 800 mg tablets in half as part of your dose, the extra half tablet should be used for the next dose. If you have not used the extra half tablet within 28 days, it should be thrown away. A special MedGuide will be given to you by the pharmacist with each prescription and refill. Be sure to read this information carefully each time. Talk to your pediatrician regarding the use of this medicine in children. Special care may be needed. Overdosage: If you think you have taken too much of this medicine contact a poison control center or emergency room at once. NOTE: This medicine is only for you. Do not share this medicine with others. What if I miss a dose? If you miss a dose, take it as soon as you can. If it is almost time for your next dose, take only that dose. Do not take double or extra doses. What may interact with this medicine? Do not  take this medicine with any of the following medications: -other gabapentin products This medicine may also interact with the following medications: -alcohol -antacids -antihistamines for allergy, cough and cold -certain medicines for anxiety or sleep -certain medicines for depression or psychotic disturbances -homatropine; hydrocodone -naproxen -narcotic medicines (opiates) for pain -phenothiazines like chlorpromazine, mesoridazine, prochlorperazine, thioridazine This list may not describe all possible interactions. Give your health care provider a list of all the medicines, herbs, non-prescription drugs, or dietary supplements you use. Also tell them if you smoke, drink alcohol, or use illegal drugs. Some items may interact with your medicine. What should I watch for while using this medicine? Visit your doctor or health care professional for regular checks on your progress. You may want to keep a record at home of how you feel your condition is responding to treatment. You may want to share this information with your doctor or health care professional at each visit. You should contact your doctor or health care professional if your seizures get worse or if you have any new types of seizures. Do not stop taking this medicine or any of your seizure medicines unless instructed by your doctor or health care professional. Stopping your medicine suddenly can increase your seizures or their severity. Wear a medical identification bracelet or chain if you are taking this medicine for seizures, and carry a card that lists all your medications. You may get drowsy, dizzy, or have blurred vision. Do not drive, use   machinery, or do anything that needs mental alertness until you know how this medicine affects you. To reduce dizzy or fainting spells, do not sit or stand up quickly, especially if you are an older patient. Alcohol can increase drowsiness and dizziness. Avoid alcoholic drinks. Your mouth may get  dry. Chewing sugarless gum or sucking hard candy, and drinking plenty of water will help. The use of this medicine may increase the chance of suicidal thoughts or actions. Pay special attention to how you are responding while on this medicine. Any worsening of mood, or thoughts of suicide or dying should be reported to your health care professional right away. Women who become pregnant while using this medicine may enroll in the [place] Pregnancy Registry by calling [phone]. This registry collects information about the safety of antiepileptic drug use during pregnancy. What side effects may I notice from receiving this medicine? Side effects that you should report to your doctor or health care professional as soon as possible: -allergic reactions like skin rash, itching or hives, swelling of the face, lips, or tongue -worsening of mood, thoughts or actions of suicide or dying Side effects that usually do not require medical attention (report to your doctor or health care professional if they continue or are bothersome): -constipation -difficulty walking or controlling muscle movements -dizziness -nausea -slurred speech -tiredness -tremors -weight gain This list may not describe all possible side effects. Call your doctor for medical advice about side effects. You may report side effects to FDA at 1-800-FDA-1088. Where should I keep my medicine? Keep out of reach of children. This medicine may cause accidental overdose and death if it taken by other adults, children, or pets. Mix any unused medicine with a substance like cat litter or coffee grounds. Then throw the medicine away in a sealed container like a sealed bag or a coffee can with a lid. Do not use the medicine after the expiration date. Store at room temperature between 15 and 30 degrees C (59 and 86 degrees F). NOTE: This sheet is a summary. It may not cover all possible information. If you have  questions about this medicine, talk to your doctor, pharmacist, or health care provider.    2016, Elsevier/Gold Standard. (2013-04-14 15:26:50)  Please cut your bisoprolol HCTZ in half.  Your target blood pressure range is 120-140/60-85.  If you are outside this range please call me and let me know.

## 2015-10-10 ENCOUNTER — Other Ambulatory Visit: Payer: Self-pay | Admitting: Urology

## 2015-10-10 LAB — LIPID PANEL
Cholesterol: 176 mg/dL (ref 125–200)
HDL: 60 mg/dL (ref >=40)
LDL Cholesterol: 88 mg/dL (ref <130)
Total CHOL/HDL Ratio: 2.9 Ratio (ref <=5.0)
Triglycerides: 141 mg/dL (ref <150)
VLDL: 28 mg/dL (ref <30)

## 2015-10-10 LAB — HEMOGLOBIN A1C
Hgb A1c MFr Bld: 5.7 % — ABNORMAL HIGH (ref <5.7)
Mean Plasma Glucose: 117 mg/dL

## 2015-10-10 LAB — BASIC METABOLIC PANEL WITH GFR
BUN: 25 mg/dL (ref 7–25)
CO2: 22 mmol/L (ref 20–31)
Calcium: 9.7 mg/dL (ref 8.6–10.3)
Chloride: 102 mmol/L (ref 98–110)
Creat: 1.58 mg/dL — ABNORMAL HIGH (ref 0.70–1.18)
GFR, Est African American: 50 mL/min — ABNORMAL LOW (ref >=60)
GFR, Est Non African American: 43 mL/min — ABNORMAL LOW (ref >=60)
Glucose, Bld: 89 mg/dL (ref 65–99)
Potassium: 4.5 mmol/L (ref 3.5–5.3)
Sodium: 137 mmol/L (ref 135–146)

## 2015-10-10 LAB — HEPATIC FUNCTION PANEL
ALT: 25 U/L (ref 9–46)
AST: 23 U/L (ref 10–35)
Albumin: 4.2 g/dL (ref 3.6–5.1)
Alkaline Phosphatase: 27 U/L — ABNORMAL LOW (ref 40–115)
Bilirubin, Direct: 0.2 mg/dL (ref <=0.2)
Indirect Bilirubin: 0.9 mg/dL (ref 0.2–1.2)
Total Bilirubin: 1.1 mg/dL (ref 0.2–1.2)
Total Protein: 6.6 g/dL (ref 6.1–8.1)

## 2015-10-11 DIAGNOSIS — M25552 Pain in left hip: Secondary | ICD-10-CM | POA: Diagnosis not present

## 2015-10-14 ENCOUNTER — Other Ambulatory Visit: Payer: Self-pay | Admitting: Urology

## 2015-10-19 ENCOUNTER — Other Ambulatory Visit: Payer: Self-pay | Admitting: Internal Medicine

## 2015-10-21 DIAGNOSIS — M1612 Unilateral primary osteoarthritis, left hip: Secondary | ICD-10-CM | POA: Diagnosis not present

## 2015-10-22 ENCOUNTER — Ambulatory Visit (INDEPENDENT_AMBULATORY_CARE_PROVIDER_SITE_OTHER): Payer: Medicare Other | Admitting: Internal Medicine

## 2015-10-22 ENCOUNTER — Encounter: Payer: Self-pay | Admitting: Internal Medicine

## 2015-10-22 VITALS — BP 118/66 | HR 54 | Temp 98.2°F | Resp 18 | Ht 71.0 in | Wt 253.0 lb

## 2015-10-22 DIAGNOSIS — J069 Acute upper respiratory infection, unspecified: Secondary | ICD-10-CM | POA: Diagnosis not present

## 2015-10-22 MED ORDER — TRAMADOL HCL 50 MG PO TABS: 100.0000 mg | ORAL_TABLET | Freq: Four times a day (QID) | ORAL | 0 refills | Status: DC | PRN

## 2015-10-22 MED ORDER — FLUTICASONE PROPIONATE 50 MCG/ACT NA SUSP: 2.0000 | Freq: Every day | NASAL | 0 refills | Status: DC

## 2015-10-22 MED ORDER — ALBUTEROL SULFATE HFA 108 (90 BASE) MCG/ACT IN AERS: 2.0000 | INHALATION_SPRAY | RESPIRATORY_TRACT | 0 refills | Status: DC | PRN

## 2015-10-22 NOTE — Patient Instructions (Signed)
Please take the albuterol every 6 hours AS NEEDED for coughing spells and shortness of breath.    Please take claritin, zyrtec, or allegra once daily to help dry up congestion.  Please take norel AD up to 3 times per day as needed to help with congestion.  Please use the inhaler once daily and make sure your rinse your mouth out after using.    Please use flonase 2 sprays per nostril right before bedtime.  Please take the ultram 2 tablets up to every 6 hours as needed for hip pain.

## 2015-10-22 NOTE — Progress Notes (Signed)
HPI  Patient presents to the office for evaluation of cough and congestion.  It has been going on for 3 days.  Patient reports all the time, wet, clear production.  They also endorse change in voice, postnasal drip, shortness of breath, wheezing and sinus congestion, sore throat and fatigue. .  They have tried mucinex and advil cold and sinus.  They report that nothing has worked.  They admits to other sick contacts.  His wife has similar symptoms.     Review of Systems  Constitutional: Positive for malaise/fatigue. Negative for chills and fever.  HENT: Positive for congestion, ear pain, hearing loss and sore throat.   Respiratory: Positive for cough. Negative for sputum production, shortness of breath and wheezing.   Cardiovascular: Negative for chest pain, palpitations and leg swelling.  Neurological: Positive for headaches.    PE:  Vitals:   10/22/15 1517  BP: 118/66  Pulse: (!) 54  Resp: 18  Temp: 98.2 F (36.8 C)    General:  Alert and non-toxic, WDWN, NAD HEENT: NCAT, PERLA, EOM normal, no occular discharge or erythema.  Nasal mucosal edema with sinus tenderness to palpation.  Oropharynx clear with minimal oropharyngeal edema and erythema.  Mucous membranes moist and pink. Neck:  Cervical adenopathy Chest:  RRR no MRGs.  Lungs clear to auscultation A&P with no wheezes rhonchi or rales.   Abdomen: +BS x 4 quadrants, soft, non-tender, no guarding, rigidity, or rebound. Skin: warm and dry no rash Neuro: A&Ox4, CN II-XII grossly intact  Assessment and Plan:  -albuterol -symbicort -spacer given -antihistamine -flonase -ultram

## 2015-10-29 DIAGNOSIS — M1612 Unilateral primary osteoarthritis, left hip: Secondary | ICD-10-CM | POA: Diagnosis not present

## 2015-11-11 DIAGNOSIS — L820 Inflamed seborrheic keratosis: Secondary | ICD-10-CM | POA: Diagnosis not present

## 2015-11-11 DIAGNOSIS — L821 Other seborrheic keratosis: Secondary | ICD-10-CM | POA: Diagnosis not present

## 2015-11-11 DIAGNOSIS — D1801 Hemangioma of skin and subcutaneous tissue: Secondary | ICD-10-CM | POA: Diagnosis not present

## 2015-11-11 DIAGNOSIS — Z85828 Personal history of other malignant neoplasm of skin: Secondary | ICD-10-CM | POA: Diagnosis not present

## 2015-11-25 DIAGNOSIS — L820 Inflamed seborrheic keratosis: Secondary | ICD-10-CM | POA: Diagnosis not present

## 2015-11-25 DIAGNOSIS — L570 Actinic keratosis: Secondary | ICD-10-CM | POA: Diagnosis not present

## 2015-11-27 ENCOUNTER — Other Ambulatory Visit: Payer: Self-pay | Admitting: Internal Medicine

## 2015-12-13 DIAGNOSIS — M1612 Unilateral primary osteoarthritis, left hip: Secondary | ICD-10-CM | POA: Diagnosis not present

## 2015-12-24 ENCOUNTER — Encounter (HOSPITAL_BASED_OUTPATIENT_CLINIC_OR_DEPARTMENT_OTHER): Payer: Self-pay | Admitting: *Deleted

## 2015-12-24 NOTE — Progress Notes (Signed)
NPO AFTER MN.  ARRIVE AT 0715.  NEEDS ISTAT 8.  CURRENT EKG IN CHART AND EPIC.  WILL TAKE PRILOSEC AM DOS W/ SIPS OF WATER.

## 2015-12-25 NOTE — Progress Notes (Signed)
Pt called w question regarding herbal supplements.  Pt informed to stop all herbal supplements and mvi.  Ok to take vitamin b.  Reminded of prior instructions to take prilosec w sip of water DOS.  Pt verbalized his understanding.

## 2015-12-30 ENCOUNTER — Encounter (HOSPITAL_BASED_OUTPATIENT_CLINIC_OR_DEPARTMENT_OTHER): Payer: Self-pay

## 2015-12-30 ENCOUNTER — Encounter (HOSPITAL_BASED_OUTPATIENT_CLINIC_OR_DEPARTMENT_OTHER)
Admission: RE | Disposition: A | Discharge status: Home / Self Care | Payer: Self-pay | Source: Ambulatory Visit | Attending: Urology

## 2015-12-30 ENCOUNTER — Ambulatory Visit (HOSPITAL_BASED_OUTPATIENT_CLINIC_OR_DEPARTMENT_OTHER): Payer: Medicare Other | Admitting: Anesthesiology

## 2015-12-30 ENCOUNTER — Ambulatory Visit (HOSPITAL_BASED_OUTPATIENT_CLINIC_OR_DEPARTMENT_OTHER)
Admission: RE | Admit: 2015-12-30 | Discharge: 2015-12-30 | Disposition: A | Discharge status: Home / Self Care | Payer: Medicare Other | Source: Ambulatory Visit | Attending: Urology | Admitting: Urology

## 2015-12-30 DIAGNOSIS — I10 Essential (primary) hypertension: Secondary | ICD-10-CM | POA: Insufficient documentation

## 2015-12-30 DIAGNOSIS — Z791 Long term (current) use of non-steroidal anti-inflammatories (NSAID): Secondary | ICD-10-CM | POA: Diagnosis not present

## 2015-12-30 DIAGNOSIS — M1990 Unspecified osteoarthritis, unspecified site: Secondary | ICD-10-CM | POA: Insufficient documentation

## 2015-12-30 DIAGNOSIS — N202 Calculus of kidney with calculus of ureter: Secondary | ICD-10-CM | POA: Diagnosis not present

## 2015-12-30 DIAGNOSIS — K219 Gastro-esophageal reflux disease without esophagitis: Secondary | ICD-10-CM | POA: Insufficient documentation

## 2015-12-30 DIAGNOSIS — Z79899 Other long term (current) drug therapy: Secondary | ICD-10-CM | POA: Diagnosis not present

## 2015-12-30 DIAGNOSIS — Z7951 Long term (current) use of inhaled steroids: Secondary | ICD-10-CM | POA: Insufficient documentation

## 2015-12-30 DIAGNOSIS — E669 Obesity, unspecified: Secondary | ICD-10-CM | POA: Insufficient documentation

## 2015-12-30 DIAGNOSIS — N201 Calculus of ureter: Secondary | ICD-10-CM | POA: Diagnosis not present

## 2015-12-30 DIAGNOSIS — Z87891 Personal history of nicotine dependence: Secondary | ICD-10-CM | POA: Diagnosis not present

## 2015-12-30 DIAGNOSIS — J449 Chronic obstructive pulmonary disease, unspecified: Secondary | ICD-10-CM | POA: Insufficient documentation

## 2015-12-30 DIAGNOSIS — N134 Hydroureter: Secondary | ICD-10-CM | POA: Insufficient documentation

## 2015-12-30 DIAGNOSIS — N200 Calculus of kidney: Secondary | ICD-10-CM | POA: Diagnosis not present

## 2015-12-30 DIAGNOSIS — Z7982 Long term (current) use of aspirin: Secondary | ICD-10-CM | POA: Diagnosis not present

## 2015-12-30 DIAGNOSIS — Z6834 Body mass index (BMI) 34.0-34.9, adult: Secondary | ICD-10-CM | POA: Diagnosis not present

## 2015-12-30 HISTORY — DX: Personal history of other diseases of the digestive system: Z87.19

## 2015-12-30 HISTORY — DX: Benign prostatic hyperplasia with lower urinary tract symptoms: N40.1

## 2015-12-30 HISTORY — DX: Personal history of colonic polyps: Z86.010

## 2015-12-30 HISTORY — PX: CYSTOSCOPY WITH STENT PLACEMENT: SHX5790

## 2015-12-30 HISTORY — DX: Prediabetes: R73.03

## 2015-12-30 HISTORY — DX: Calculus of ureter: N20.1

## 2015-12-30 HISTORY — PX: HOLMIUM LASER APPLICATION: SHX5852

## 2015-12-30 HISTORY — DX: Personal history of colon polyps, unspecified: Z86.0100

## 2015-12-30 HISTORY — PX: CYSTOSCOPY/RETROGRADE/URETEROSCOPY/STONE EXTRACTION WITH BASKET: SHX5317

## 2015-12-30 HISTORY — DX: Personal history of other endocrine, nutritional and metabolic disease: Z86.39

## 2015-12-30 HISTORY — DX: Male erectile dysfunction, unspecified: N52.9

## 2015-12-30 LAB — POCT I-STAT, CHEM 8
BUN: 26 mg/dL — ABNORMAL HIGH (ref 6–20)
Calcium, Ion: 1.23 mmol/L (ref 1.15–1.40)
Chloride: 104 mmol/L (ref 101–111)
Creatinine, Ser: 1.3 mg/dL — ABNORMAL HIGH (ref 0.61–1.24)
Glucose, Bld: 122 mg/dL — ABNORMAL HIGH (ref 65–99)
HCT: 44 % (ref 39.0–52.0)
Hemoglobin: 15 g/dL (ref 13.0–17.0)
Potassium: 4 mmol/L (ref 3.5–5.1)
Sodium: 139 mmol/L (ref 135–145)
TCO2: 22 mmol/L (ref 0–100)

## 2015-12-30 SURGERY — CYSTOSCOPY, WITH CALCULUS REMOVAL USING BASKET
Anesthesia: General | Laterality: Left

## 2015-12-30 MED ORDER — 0.9 % SODIUM CHLORIDE (POUR BTL) OPTIME
TOPICAL | Status: DC | PRN
  Administered 2015-12-30: 500 mL

## 2015-12-30 MED ORDER — SODIUM CHLORIDE 0.9 % IR SOLN
Status: DC | PRN
  Administered 2015-12-30 (×3): 3000 mL

## 2015-12-30 MED ORDER — PHENYLEPHRINE HCL 10 MG/ML IJ SOLN
INTRAMUSCULAR | Status: AC
  Filled 2015-12-30: qty 1

## 2015-12-30 MED ORDER — PROMETHAZINE HCL 25 MG/ML IJ SOLN
6.2500 mg | INTRAMUSCULAR | Status: DC | PRN
  Filled 2015-12-30: qty 1

## 2015-12-30 MED ORDER — MIDAZOLAM HCL 2 MG/2ML IJ SOLN
INTRAMUSCULAR | Status: AC
  Filled 2015-12-30: qty 2

## 2015-12-30 MED ORDER — ONDANSETRON HCL 4 MG/2ML IJ SOLN
INTRAMUSCULAR | Status: DC | PRN
  Administered 2015-12-30: 4 mg via INTRAVENOUS

## 2015-12-30 MED ORDER — SULFAMETHOXAZOLE-TRIMETHOPRIM 800-160 MG PO TABS: 1.0000 | ORAL_TABLET | Freq: Two times a day (BID) | ORAL | 0 refills | Status: DC

## 2015-12-30 MED ORDER — CEFAZOLIN SODIUM-DEXTROSE 2-4 GM/100ML-% IV SOLN
2.0000 g | INTRAVENOUS | Status: AC
  Administered 2015-12-30: 2 g via INTRAVENOUS
  Filled 2015-12-30: qty 100

## 2015-12-30 MED ORDER — ONDANSETRON HCL 4 MG/2ML IJ SOLN
INTRAMUSCULAR | Status: AC
  Filled 2015-12-30: qty 2

## 2015-12-30 MED ORDER — FENTANYL CITRATE (PF) 100 MCG/2ML IJ SOLN
25.0000 ug | INTRAMUSCULAR | Status: DC | PRN
  Filled 2015-12-30: qty 1

## 2015-12-30 MED ORDER — OXYBUTYNIN CHLORIDE 5 MG PO TABS: 5.0000 mg | ORAL_TABLET | Freq: Three times a day (TID) | ORAL | 1 refills | Status: DC

## 2015-12-30 MED ORDER — SUGAMMADEX SODIUM 200 MG/2ML IV SOLN
INTRAVENOUS | Status: DC | PRN
  Administered 2015-12-30: 400 mg via INTRAVENOUS

## 2015-12-30 MED ORDER — CEFAZOLIN SODIUM-DEXTROSE 2-4 GM/100ML-% IV SOLN
INTRAVENOUS | Status: AC
  Filled 2015-12-30: qty 100

## 2015-12-30 MED ORDER — SUCCINYLCHOLINE CHLORIDE 200 MG/10ML IV SOSY
PREFILLED_SYRINGE | INTRAVENOUS | Status: DC | PRN
  Administered 2015-12-30: 185 mg via INTRAVENOUS

## 2015-12-30 MED ORDER — SUGAMMADEX SODIUM 200 MG/2ML IV SOLN
INTRAVENOUS | Status: AC
  Filled 2015-12-30: qty 2

## 2015-12-30 MED ORDER — FENTANYL CITRATE (PF) 100 MCG/2ML IJ SOLN
INTRAMUSCULAR | Status: DC | PRN
  Administered 2015-12-30: 50 ug via INTRAVENOUS

## 2015-12-30 MED ORDER — ROCURONIUM BROMIDE 100 MG/10ML IV SOLN
INTRAVENOUS | Status: DC | PRN
  Administered 2015-12-30: 35 mg via INTRAVENOUS
  Administered 2015-12-30: 15 mg via INTRAVENOUS

## 2015-12-30 MED ORDER — DEXAMETHASONE SODIUM PHOSPHATE 10 MG/ML IJ SOLN
INTRAMUSCULAR | Status: AC
  Filled 2015-12-30: qty 1

## 2015-12-30 MED ORDER — IOHEXOL 300 MG/ML  SOLN
INTRAMUSCULAR | Status: DC | PRN
  Administered 2015-12-30: 10 mL via INTRAVENOUS

## 2015-12-30 MED ORDER — FENTANYL CITRATE (PF) 100 MCG/2ML IJ SOLN
INTRAMUSCULAR | Status: AC
  Filled 2015-12-30: qty 2

## 2015-12-30 MED ORDER — PHENYLEPHRINE 40 MCG/ML (10ML) SYRINGE FOR IV PUSH (FOR BLOOD PRESSURE SUPPORT)
PREFILLED_SYRINGE | INTRAVENOUS | Status: AC
  Filled 2015-12-30: qty 10

## 2015-12-30 MED ORDER — BELLADONNA ALKALOIDS-OPIUM 16.2-60 MG RE SUPP
RECTAL | Status: DC | PRN
  Administered 2015-12-30: 1 via RECTAL

## 2015-12-30 MED ORDER — PHENYLEPHRINE HCL 10 MG/ML IJ SOLN
INTRAVENOUS | Status: DC | PRN
  Administered 2015-12-30: 100 ug/min via INTRAVENOUS

## 2015-12-30 MED ORDER — PHENYLEPHRINE HCL 10 MG/ML IJ SOLN
INTRAMUSCULAR | Status: DC | PRN
  Administered 2015-12-30 (×4): 80 ug via INTRAVENOUS

## 2015-12-30 MED ORDER — PROPOFOL 10 MG/ML IV BOLUS
INTRAVENOUS | Status: DC | PRN
  Administered 2015-12-30: 200 mg via INTRAVENOUS
  Administered 2015-12-30: 50 mg via INTRAVENOUS

## 2015-12-30 MED ORDER — SUCCINYLCHOLINE CHLORIDE 20 MG/ML IJ SOLN
INTRAMUSCULAR | Status: AC
  Filled 2015-12-30: qty 1

## 2015-12-30 MED ORDER — LIDOCAINE HCL 2 % EX GEL
CUTANEOUS | Status: AC
  Filled 2015-12-30: qty 5

## 2015-12-30 MED ORDER — BELLADONNA ALKALOIDS-OPIUM 16.2-60 MG RE SUPP
RECTAL | Status: AC
  Filled 2015-12-30: qty 1

## 2015-12-30 MED ORDER — CEFAZOLIN IN D5W 1 GM/50ML IV SOLN
1.0000 g | INTRAVENOUS | Status: DC
  Filled 2015-12-30: qty 50

## 2015-12-30 MED ORDER — STERILE WATER FOR IRRIGATION IR SOLN
Status: DC | PRN
  Administered 2015-12-30: 3000 mL

## 2015-12-30 MED ORDER — LACTATED RINGERS IV SOLN
INTRAVENOUS | Status: DC
  Administered 2015-12-30 (×2): via INTRAVENOUS
  Filled 2015-12-30: qty 1000

## 2015-12-30 MED ORDER — LIDOCAINE 2% (20 MG/ML) 5 ML SYRINGE
INTRAMUSCULAR | Status: DC | PRN
  Administered 2015-12-30: 100 mg via INTRAVENOUS

## 2015-12-30 MED ORDER — DEXAMETHASONE SODIUM PHOSPHATE 4 MG/ML IJ SOLN
INTRAMUSCULAR | Status: DC | PRN
  Administered 2015-12-30: 10 mg via INTRAVENOUS

## 2015-12-30 SURGICAL SUPPLY — 33 items
BAG DRAIN URO-CYSTO SKYTR STRL (DRAIN) ×2 IMPLANT
BAG DRN UROCATH (DRAIN) ×1
BASKET DAKOTA 1.9FR 11X120 (BASKET) ×2 IMPLANT
BASKET STNLS GEMINI 4WIRE 3FR (BASKET) IMPLANT
BASKET ZERO TIP NITINOL 2.4FR (BASKET) IMPLANT
BSKT STON RTRVL GEM 120X11 3FR (BASKET)
BSKT STON RTRVL ZERO TP 2.4FR (BASKET)
CATH INTERMIT  6FR 70CM (CATHETERS) IMPLANT
CATH URET 5FR 28IN CONE TIP (BALLOONS)
CATH URET 5FR 28IN OPEN ENDED (CATHETERS) IMPLANT
CATH URET 5FR 70CM CONE TIP (BALLOONS) IMPLANT
CLOTH BEACON ORANGE TIMEOUT ST (SAFETY) ×2 IMPLANT
FIBER LASER FLEXIVA 200 (UROLOGICAL SUPPLIES) ×1 IMPLANT
FIBER LASER FLEXIVA 365 (UROLOGICAL SUPPLIES) IMPLANT
FIBER LASER TRAC TIP (UROLOGICAL SUPPLIES) IMPLANT
GLOVE BIO SURGEON STRL SZ8 (GLOVE) ×2 IMPLANT
GOWN STRL REUS W/ TWL LRG LVL3 (GOWN DISPOSABLE) ×1 IMPLANT
GOWN STRL REUS W/ TWL XL LVL3 (GOWN DISPOSABLE) ×1 IMPLANT
GOWN STRL REUS W/TWL LRG LVL3 (GOWN DISPOSABLE) ×2
GOWN STRL REUS W/TWL XL LVL3 (GOWN DISPOSABLE) ×2
GUIDEWIRE 0.038 PTFE COATED (WIRE) IMPLANT
GUIDEWIRE ANG ZIPWIRE 038X150 (WIRE) IMPLANT
GUIDEWIRE STR DUAL SENSOR (WIRE) IMPLANT
IV NS IRRIG 3000ML ARTHROMATIC (IV SOLUTION) ×2 IMPLANT
KIT ROOM TURNOVER WOR (KITS) ×2 IMPLANT
LASER FIBER DISP (UROLOGICAL SUPPLIES) IMPLANT
MANIFOLD NEPTUNE II (INSTRUMENTS) IMPLANT
NS IRRIG 500ML POUR BTL (IV SOLUTION) IMPLANT
PACK CYSTO (CUSTOM PROCEDURE TRAY) ×2 IMPLANT
SHEATH ACCESS URETERAL 38CM (SHEATH) ×1 IMPLANT
STENT URET 6FRX24 CONTOUR (STENTS) ×1 IMPLANT
SYRINGE IRR TOOMEY STRL 70CC (SYRINGE) ×1 IMPLANT
TUBE CONNECTING 12X1/4 (SUCTIONS) IMPLANT

## 2015-12-30 NOTE — Anesthesia Procedure Notes (Signed)
Procedure Name: Intubation Date/Time: 12/30/2015 9:05 AM Performed by: Wanita Chamberlain Pre-anesthesia Checklist: Patient identified, Timeout performed, Emergency Drugs available, Suction available and Patient being monitored Patient Re-evaluated:Patient Re-evaluated prior to inductionOxygen Delivery Method: Circle system utilized Preoxygenation: Pre-oxygenation with 100% oxygen Intubation Type: IV induction and Cricoid Pressure applied Ventilation: Mask ventilation without difficulty Laryngoscope Size: Mac and 4 Grade View: Grade I Tube type: Oral Number of attempts: 2 Airway Equipment and Method: Stylet Placement Confirmation: ETT inserted through vocal cords under direct vision,  positive ETCO2 and breath sounds checked- equal and bilateral Secured at: 22 cm Tube secured with: Tape Dental Injury: Teeth and Oropharynx as per pre-operative assessment

## 2015-12-30 NOTE — Anesthesia Preprocedure Evaluation (Addendum)
Anesthesia Evaluation  Patient identified by MRN, date of birth, ID band Patient awake    Reviewed: Allergy & Precautions, NPO status , Patient's Chart, lab work & pertinent test results  Airway Mallampati: II  TM Distance: >3 FB Neck ROM: Full    Dental no notable dental hx.    Pulmonary former smoker,  Patient denies COPD. Does not use inhalers.   Pulmonary exam normal breath sounds clear to auscultation       Cardiovascular hypertension, negative cardio ROS Normal cardiovascular exam Rhythm:Regular Rate:Normal     Neuro/Psych negative neurological ROS  negative psych ROS   GI/Hepatic Neg liver ROS, GERD  ,  Endo/Other  negative endocrine ROS  Renal/GU Renal disease  negative genitourinary   Musculoskeletal  (+) Arthritis ,   Abdominal (+) + obese,   Peds negative pediatric ROS (+)  Hematology negative hematology ROS (+)   Anesthesia Other Findings   Reproductive/Obstetrics negative OB ROS                            Anesthesia Physical Anesthesia Plan  ASA: II  Anesthesia Plan: General   Post-op Pain Management:    Induction: Intravenous  Airway Management Planned: Oral ETT  Additional Equipment:   Intra-op Plan:   Post-operative Plan: Extubation in OR  Informed Consent: I have reviewed the patients History and Physical, chart, labs and discussed the procedure including the risks, benefits and alternatives for the proposed anesthesia with the patient or authorized representative who has indicated his/her understanding and acceptance.   Dental advisory given  Plan Discussed with: CRNA  Anesthesia Plan Comments:        Anesthesia Quick Evaluation

## 2015-12-30 NOTE — Anesthesia Postprocedure Evaluation (Signed)
Anesthesia Post Note  Patient: [name] [name]  Procedure(s) Performed: Procedure(s) (LRB): CYSTOSCOPY/RETROGRADE/URETEROSCOPY/STONE EXTRACTION WITH BASKET (Left) CYSTOSCOPY WITH STENT PLACEMENT (Left) HOLMIUM LASER APPLICATION (Left)  Patient location during evaluation: PACU Anesthesia Type: General Level of consciousness: awake and alert Pain management: pain level controlled Vital Signs Assessment: post-procedure vital signs reviewed and stable Respiratory status: spontaneous breathing, nonlabored ventilation, respiratory function stable and patient connected to nasal cannula oxygen Cardiovascular status: blood pressure returned to baseline and stable Postop Assessment: no signs of nausea or vomiting Anesthetic complications: no    Last Vitals:  Vitals:   12/30/15 1115 12/30/15 1130  BP: 115/64 136/83  Pulse: (!) 56 66  Resp: 14 16  Temp:      Last Pain:  Vitals:   12/30/15 1035  TempSrc:   PainSc: Asleep                 Santoria Chason J

## 2015-12-30 NOTE — Op Note (Signed)
Preoperative diagnosis: Left ureteral and renal calculi  Postoperative diagnosis: Same  Principal procedure: Cystoscopy, retrograde ureteropyelogram on left with fluoroscopic interpretation, semirigid and flexible ureterorenoscopy, holmium laser and extraction of multiple left ureteral and renal calculi, placement of 6 French by 24 centimeter contour double-J stent with string  Surgeon: Reeve Mallo  Anesthesia: Gen. with LMA  Complications: None  Specimens: Multiple stones and fragments, to the patient's wife  Drains: 6 [place] by 24 centimeter contour double-J stent with thread  Estimated blood loss: None  Indications: 75 year old male with recurrent urolithiasis, last seen a couple of months ago in the office with left flank and lower quadrant pain.  KUB revealed a 9 by 11 millimeter calcification in the area of the left mid ureter, as well as multiple fragments/stones in the left lower pole calyceal system.  The patient presents at this time for ureteroscopic management of these stones to render him stone free on the left side.  Procedure of ureteroscopy, holmium laser, extraction, double-J stent placement was discussed in detail with the patient.  Risks and complications were also reviewed-ureteral injury, infection, bleeding, anesthetic complications, ureteral strictures, among others.  The patient understands and desires to proceed.  Findings: Cystoscopy revealed a normal bladder with normal urothelium, ureteral orifices were normal in configuration and location.  Retrograde ureteropyelogram on the left revealed a filling defect in the lower ureter, just proximal to the UVJ.  This was consistent with a left distal ureteral stone.  Several other filling defects were seen just proximal to this.  There was minimal hydroureter.  Pyelocalyceal system was normal except for some filling defects in the lower pole calyx.  Description of procedure: The patient was properly identified in the holding  area and his left side was marked.  He received preoperative IV antibiotics.  He was taken to the operating room where general anesthetic was administered with the LMA.  He was placed in the dorsolithotomy position.  Genitalia and perineum were prepped and draped, time out was performed.  A 23 French panendoscope was advanced under direct vision through his urethra.  Urethra was normal without stricture.  Prostate was minimally obstructive with bilobar hypertrophy.  The bladder was inspected circumferentially.  It was found to be normal.  The left ureteral orifice was cannulated with a 6 [place] open-ended catheter.  Approximately 7 mL of Omnipaque was used to perform a left retrograde ureteropyelogram.  The findings were discussed above.  Following the retrograde ureteropyelogram, a 0.038 inch sensor-tip guidewire was advanced to the open-ended catheter.  This was advanced up into the upper pole calyceal system.  The cystoscope, and the open-ended catheter was then removed with the guidewire left in place.  The distal ureter was then dilated first with the inner core and then with the entire 12/14 ureteral access catheter.  Following this, the access catheter was removed over top the guidewire.  A 6 French dual-lumen ureteroscope was then advanced under direct vision through his urethra and up into the distal ureter with a large stone was encountered, with multiple, perhaps 6-7 smaller stones, just behind it.  The smaller stones were extracted using the engage basket.  These could be easily dropped into the bladder.  The larger stone would not pass with the basket, and was then fragmented using 200 micron fiber, with the powered eventually increased to 1 joule and 20 hertz.  Once this was adequately fragmented, the fragments were then removed and extracted into the bladder where there were dropped.  No further ureteral  calculi were noted.  There was minimal ureteral trauma.  The ureteroscope was then removed,  and the access sheath was advanced into the proximal ureter over the guidewire.  The guidewire was then removed, and the core of the access catheter removed as well.  I then passed the flexible ureteroscope through the ureteral access catheter and into the pelvis of the left kidney.  Upper pole and interpolar calyces were inspected and found to be stone free.  Several stones were identified in the lower pole calyceal system.  Most of these were grasped and extracted through the ureteral access catheter.  There was a large stone that was densely adherent to the papilla of the lower pole calyx.  This would not come free with significant traction on the stone.  I then used the laser fiber to apply laser energy and fragment the stone into multiple smaller stones which were then easily extracted.  1-2 small fragments were unable to the grasp in this lower pole calyx, despite significant torquing of the ureteroscope.  These were left alone, as a were quite small.  Remaining calyceal inspection was then performed, using the scope, looking into the lower pole calyces, interpolar and upper pole calyces.  No further stones were seen here in the renal pelvis.  At this point, the ureteroscope was removed.  I then advanced the sensor-tip guidewire through the ureteral access catheter into the upper pole calyces  The access catheter was then removed.  I then passed the cystoscope into the bladder.  All the stone fragments were then irrigated free and saved.  Once all stones were irrigated from the bladder, I passed a 24 centimeter by 6 French contour double-J stent, with the string left on.  This was deployed in the ureter with excellent proximal and distal Coil seen fluoroscopically and cystoscopically, respectively.  The string was left on and brought through the patient's urethra and taped to his penis.  The bladder was then drained and the scope removed.    The patient was then awakened and taken to PACU in stable  condition.  He tolerated the procedure well.

## 2015-12-30 NOTE — H&P (Signed)
Urology History and Physical Exam  CC: Left sided stones  HPI: 75 year old male presents for ureteroscopic mgmt of left ureteral and left lower pole stones. He presented 2 mos ago with left flank pain and radiographic evaluation revealed 1 9x10 mm left midurteral and left lower pole stones.  PMH: Past Medical History:  Diagnosis Date  . Arthritis   . COPD (chronic obstructive pulmonary disease) ([name] [name])   . ED (erectile dysfunction)   . Enlarged prostate with lower urinary tract symptoms (LUTS)   . GERD (gastroesophageal reflux disease)   . History of chronic gastritis   . History of colon polyps    hyperplastic 2006  . History of esophageal stricture    S/P  DILATATION 2009; 2010; 2010 2012  . History of kidney stones    hx. multiple kidney stones  . History of kidney stones   . History of primary hyperparathyroidism    s/p  left superior parathyroidectomy 07-10-2014  . Hyperlipidemia   . Hypertension   . Left ureteral stone   . Nephrolithiasis    left side   . Pre-diabetes     PSH: Past Surgical History:  Procedure Laterality Date  . COLONOSCOPY  02/17/2005  . CYSTO/  LEFT RETROGRADE PYELOGRAM/ STENT PLACEMENT  01/26/2005  . CYSTOSCOPY W/ URETEROSCOPY W/ LITHOTRIPSY Left 05/04/2005  . ESOPHAGOGASTRODUODENOSCOPY (EGD) WITH ESOPHAGEAL DILATION  x4  last one 07-21-2010  . INGUINAL HERNIA REPAIR Left yrs ago  . NEPHROLITHOTOMY Left 02/01/2014   Procedure: NEPHROLITHOTOMY PERCUTANEOUS;  Surgeon: [name] Loa, MD;  Location: [facility];  Service: Urology;  Laterality: Left;  . PARATHYROIDECTOMY Left 07/10/2014   Procedure: LEFT SUPERIOR PARATHYROIDECTOMY;  Surgeon: [name] Gemma, MD;  Location: [place];  Service: General;  Laterality: Left;  . tumor ear Left age 55    topical growth behind left ear.    Allergies: Allergies  Allergen Reactions  . Iohexol Shortness Of Breath  . Ace Inhibitors     cough    Medications: No prescriptions prior to admission.     Social  History: Social History   Social History  . Marital status: Married    Spouse name: N/A  . Number of children: N/A  . Years of education: N/A   Occupational History  . Not on file.   Social History Main Topics  . Smoking status: Former Smoker    Years: 8.00    Quit date: 03/02/1974  . Smokeless tobacco: Never Used  . Alcohol use 4.2 oz/week    7 Glasses of wine per week  . Drug use: No  . Sexual activity: Yes   Other Topics Concern  . Not on file   Social History Narrative  . No narrative on file    Family History: History reviewed. No pertinent family history.  Review of Systems: Positive: Left flank pain Negative:   A further 10 point review of systems was negative except what is listed in the HPI.                  Physical Exam: @VITALS2 @ General: No acute distress.  Awake. Head:  Normocephalic.  Atraumatic. ENT:  EOMI.  Mucous membranes moist Neck:  Supple.  No lymphadenopathy. CV:  S1 present. S2 present. Regular rate. Pulmonary: Equal effort bilaterally.  Clear to auscultation bilaterally. Abdomen: Soft.  nontender to palpation. Skin:  Normal turgor.  No visible rash. Extremity: No gross deformity of bilateral upper extremities.  No gross deformity of  lower extremities. Neurologic: Alert. Appropriate mood.   Studies:  No results for input(s): HGB, WBC, PLT in the last 72 hours.  No results for input(s): NA, K, CL, CO2, BUN, CREATININE, CALCIUM, GFRNONAA, GFRAA in the last 72 hours.  Invalid input(s): MAGNESIUM   No results for input(s): INR, APTT in the last 72 hours.  Invalid input(s): PT   Invalid input(s): ABG    Assessment:  Left ureteral and lower pole calculi  Plan: Cysto, left RGP, left ureteroscopy and HLL of stones, extraction and possible J2 stent

## 2015-12-30 NOTE — Transfer of Care (Signed)
Immediate Anesthesia Transfer of Care Note  Patient: [name] [name]  Procedure(s) Performed: Procedure(s): CYSTOSCOPY/RETROGRADE/URETEROSCOPY/STONE EXTRACTION WITH BASKET (Left) CYSTOSCOPY WITH STENT PLACEMENT (Left) HOLMIUM LASER APPLICATION (Left)  Patient Location: PACU  Anesthesia Type:General  Level of Consciousness: awake, alert , oriented and patient cooperative  Airway & Oxygen Therapy: Patient Spontanous Breathing and Patient connected to nasal cannula oxygen  Post-op Assessment: Report given to RN and Post -op Vital signs reviewed and stable  Post vital signs: Reviewed and stable  Last Vitals:  Vitals:   12/30/15 0716 12/30/15 1035  BP: 140/77   Pulse: 61 63  Resp: 16 17  Temp: 36.4 C 36.3 C    Last Pain:  Vitals:   12/30/15 0741  TempSrc:   PainSc: 4          Complications: No apparent anesthesia complications

## 2015-12-30 NOTE — Discharge Instructions (Signed)
Post Anesthesia Home Care Instructions  Activity: Get plenty of rest for the remainder of the day. A responsible adult should stay with you for 24 hours following the procedure.  For the next 24 hours, DO NOT: -Drive a car -Paediatric nurse -Drink alcoholic beverages -Take any medication unless instructed by your physician -Make any legal decisions or sign important papers.  Meals: Start with liquid foods such as gelatin or soup. Progress to regular foods as tolerated. Avoid greasy, spicy, heavy foods. If nausea and/or vomiting occur, drink only clear liquids until the nausea and/or vomiting subsides. Call your physician if vomiting continues.  Special Instructions/Symptoms: Your throat may feel dry or sore from the anesthesia or the breathing tube placed in your throat during surgery. If this causes discomfort, gargle with warm salt water. The discomfort should disappear within 24 hours.  If you had a scopolamine patch placed behind your ear for the management of post- operative nausea and/or vomiting:  1. The medication in the patch is effective for 72 hours, after which it should be removed.  Wrap patch in a tissue and discard in the trash. Wash hands thoroughly with soap and water. 2. You may remove the patch earlier than 72 hours if you experience unpleasant side effects which may include dry mouth, dizziness or visual disturbances. 3. Avoid touching the patch. Wash your hands with soap and water after contact with the patch.   POSTOPERATIVE CARE AFTER URETEROSCOPY  Stent management  *Stents are often left in after ureteroscopy and stone treatment. If left in, they often cause urinary frequency, urgency, occasional blood in the urine, as well as flank discomfort with urination. These are all expected issues, and should resolve after the stent is removed. *Often times, a small thread is left on the end of the stent, and brought out through the urethra. If so, this is used to remove  the stent, making it unnecessary to look in the bladder with a scope in the office to remove the stent. If a thread is left on, did not pull on it until instructed. * OK to pull string to remove the catheter on Thursday morning  Diet  Once you have adequately recovered from anesthesia, you may gradually advance your diet, as tolerated, to your regular diet.  Activities  You may gradually increase your activities to your normal unrestricted level the day following your procedure.  Medications  You should resume all preoperative medications. If you are on aspirin-like compounds, you should not resume these until the blood clears from your urine. If given an antibiotic by the surgeon, take these until they are completed. You may also be given, if you have a stent, medications to decrease the urinary frequency and urgency.  Pain  After ureteroscopy, there may be some pain on the side of the scope. Take your pain medicine for this. Usually, this pain resolves within a day or 2.  Fever  Please report any fever over 100 to the doctor.  GENERAL INSTRUCTIONS:  Despite the fact that no skin incisions were used, the area around the ureter and bladder is raw and irritated. The stent is a foreign body which will further irritate the bladder wall. This irritation is manifested by increased frequency of urination, both day and night, and by an increase in the urge to urinate. In some, the urge to urinate is present almost always. Sometimes the urge is strong enough that you may not be able to stop yourself from urinating. The only real cure  is to remove the stent and then give time for the bladder wall to heal which can't be done until the danger of the ureter swelling shut has passed, which varies.  You may see some blood in your urine while the stent is in place and a few days afterwards. Do not be alarmed, even if the urine was clear for a while. Get off your feet and drink lots of fluids until  clearing occurs. If you start to pass clots or don't improve, call us.  DIET: You may return to your normal diet immediately. Because of the raw surface of your bladder, alcohol, spicy foods, acid type foods and drinks with caffeine may cause irritation or frequency and should be used in moderation. To keep your urine flowing freely and to avoid constipation, drink plenty of fluids during the day ( 8-10 glasses ). Tip: Avoid cranberry juice because it is very acidic.  ACTIVITY: Your physical activity doesn't need to be restricted. However, if you are very active, you may see some blood in your urine. We suggest that you reduce your activity under these circumstances until the bleeding has stopped.  BOWELS: It is important to keep your bowels regular during the postoperative period. Straining with bowel movements can cause bleeding. A bowel movement every other day is reasonable. Use a mild laxative if needed, such as Milk of Magnesia 2-3 tablespoons, or 2 Dulcolax tablets. Call if you continue to have problems. If you have been taking narcotics for pain, before, during or after your surgery, you may be constipated. Take a laxative if necessary.   PROBLEMS YOU SHOULD REPORT TO [place]:  Fevers over 100 Fahrenheit.  Heavy bleeding, or clots ( See above notes about blood in urine ).  Inability to urinate.  Drug reactions ( hives, rash, nausea, vomiting, diarrhea ).  Severe burning or pain with urination that is not improving.

## 2015-12-31 ENCOUNTER — Encounter (HOSPITAL_BASED_OUTPATIENT_CLINIC_OR_DEPARTMENT_OTHER): Payer: Self-pay | Admitting: Urology

## 2016-01-01 ENCOUNTER — Other Ambulatory Visit: Payer: Self-pay | Admitting: Internal Medicine

## 2016-01-06 ENCOUNTER — Ambulatory Visit (INDEPENDENT_AMBULATORY_CARE_PROVIDER_SITE_OTHER): Payer: Medicare Other | Admitting: Internal Medicine

## 2016-01-06 ENCOUNTER — Encounter: Payer: Self-pay | Admitting: Internal Medicine

## 2016-01-06 VITALS — BP 110/72 | HR 60 | Temp 97.5°F | Resp 16 | Ht 71.0 in | Wt 256.6 lb

## 2016-01-06 DIAGNOSIS — E782 Mixed hyperlipidemia: Secondary | ICD-10-CM | POA: Diagnosis not present

## 2016-01-06 DIAGNOSIS — E559 Vitamin D deficiency, unspecified: Secondary | ICD-10-CM

## 2016-01-06 DIAGNOSIS — R7303 Prediabetes: Secondary | ICD-10-CM | POA: Diagnosis not present

## 2016-01-06 DIAGNOSIS — Z79899 Other long term (current) drug therapy: Secondary | ICD-10-CM | POA: Diagnosis not present

## 2016-01-06 DIAGNOSIS — I10 Essential (primary) hypertension: Secondary | ICD-10-CM | POA: Diagnosis not present

## 2016-01-06 DIAGNOSIS — K219 Gastro-esophageal reflux disease without esophagitis: Secondary | ICD-10-CM

## 2016-01-06 NOTE — Progress Notes (Signed)
[facility] Unk Pinto, M.D.        [name] [name]. Silverio Lay, P.A.-C       Starlyn Skeans, P.A.-C  [facility]                [address] [place] [id number] Telephone [phone] Telefax [phone] ______________________________________________________________________     This very nice 75 y.o. MWM presents for  6 month follow up with Hypertension, Hyperlipidemia, Pre-Diabetes and Vitamin D Deficiency. In May 2016, patient underwent a L Superior Parathyroidectomy. Recently he underwent lithotripsy for recurrent kidney stones.      Patient is treated for HTN (2002) & BP has been controlled at home. Today's BP is  110/72. Patient has had no complaints of any cardiac type chest pain, palpitations, dyspnea/orthopnea/PND, dizziness, claudication, or dependent edema.     Hyperlipidemia is controlled with diet & meds. Patient denies myalgias or other med SE's. Last Lipids were at goal: Lab Results  Component Value Date   CHOL 176 10/09/2015   HDL 60 10/09/2015   LDLCALC 88 10/09/2015   TRIG 141 10/09/2015   CHOLHDL 2.9 10/09/2015      Also, the patient has history of Morbid Obesity (BMI 35+) and PreDiabetes since 2010 wit A1c 6.4% and elevated insulin 124 and A1c 6.0% in Jan 2017 and has had no symptoms of reactive hypoglycemia, diabetic polys, paresthesias or visual blurring.  Last A1c was near goal: Lab Results  Component Value Date   HGBA1C 5.7 (H) 10/09/2015      Further, the patient also has history of Vitamin D Deficiency in 2009 of "36" and supplements vitamin D without any suspected side-effects. Last vitamin D was  At goal: Lab Results  Component Value Date   VD25OH 64 06/27/2015   Current Outpatient Prescriptions on File Prior to Visit  Medication Sig  . VITAMIN C 1000 MG  Take 1,000 mg by mouth daily.  Marland Kitchen aspirin 81 MG tablet Take 81 mg by mouth daily.  . B Complex Vitamins  Take 1  capsule by mouth every evening. Vegetable formula  . bisoprolol-hctz (Z 5-6.25  TAKE 1 TABLET BY MOUTH EVERY MORNING FOR BLOOD PRESSURE  . fenofibrate  145 MG  TAKE 1 TABLET DAILY FOR TRIGLYCERIDES / BLOOD FATS  . FLAXSEED OIL  Take 1 capsule by mouth every morning. 1000mg   . losartan  100 MG  TAKE 1 TABLET BY MOUTH DAILY FOR BLOOD PRESSURE.  . Magnesium 400 MG  Take 1 capsule by mouth daily.  . meloxicam  15 MG  TAKE 1 TABLET BY MOUTH DAILY.  Marland Kitchen Omega-3 FISH OIL 1200 MG  Take  2  times daily.  Marland Kitchen omeprazole  40 MG  TAKE 1 CAP DAILY.  . tamsulosin  0.4 MG  Take 0.4 mg by mouth daily after supper.    Allergies  Allergen Reactions  . Iohexol Shortness Of Breath  . Ace Inhibitors     cough   PMHx:   Past Medical History:  Diagnosis Date  . Arthritis   . COPD (chronic obstructive pulmonary disease) ([place])   . ED (erectile dysfunction)   . Enlarged prostate with lower urinary tract symptoms (LUTS)   . GERD (gastroesophageal reflux disease)   . History of chronic gastritis   . History of colon polyps    hyperplastic 2006  . History of esophageal stricture  S/P  DILATATION 2009; 2010; 2010 2012  . History of kidney stones    hx. multiple kidney stones  . History of kidney stones   . History of primary hyperparathyroidism    s/p  left superior parathyroidectomy 07-10-2014  . Hyperlipidemia   . Hypertension   . Left ureteral stone   . Nephrolithiasis    left side   . Pre-diabetes    Immunization History  Administered Date(s) Administered  . Influenza Split 12/27/2012  . Influenza, High Dose Seasonal PF 01/31/2014, 11/27/2014  . Pneumococcal Conjugate-13 03/25/2015  . Pneumococcal Polysaccharide-23 03/02/2008  . Td 03/02/2005, 09/24/2015  . Zoster 03/02/2006   Past Surgical History:  Procedure Laterality Date  . COLONOSCOPY  02/17/2005  . CYSTO/  LEFT RETROGRADE PYELOGRAM/ STENT PLACEMENT  01/26/2005  . CYSTOSCOPY W/ URETEROSCOPY W/ LITHOTRIPSY Left 05/04/2005  .  CYSTOSCOPY WITH STENT PLACEMENT Left 12/30/2015   Procedure: CYSTOSCOPY WITH STENT PLACEMENT;  Surgeon: Franchot Gallo, MD;  Location: [facility];  Service: Urology;  Laterality: Left;  . CYSTOSCOPY/RETROGRADE/URETEROSCOPY/STONE EXTRACTION WITH BASKET Left 12/30/2015   Procedure: CYSTOSCOPY/RETROGRADE/URETEROSCOPY/STONE EXTRACTION WITH BASKET;  Surgeon: Franchot Gallo, MD;  Location: [facility];  Service: Urology;  Laterality: Left;  . ESOPHAGOGASTRODUODENOSCOPY (EGD) WITH ESOPHAGEAL DILATION  x4  last one 07-21-2010  . HOLMIUM LASER APPLICATION Left [id number]   Procedure: HOLMIUM LASER APPLICATION;  Surgeon: Franchot Gallo, MD;  Location: [facility];  Service: Urology;  Laterality: Left;  . INGUINAL HERNIA REPAIR Left yrs ago  . NEPHROLITHOTOMY Left 02/01/2014   Procedure: NEPHROLITHOTOMY PERCUTANEOUS;  Surgeon: Jorja Loa, MD;  Location: [facility];  Service: Urology;  Laterality: Left;  . PARATHYROIDECTOMY Left 07/10/2014   Procedure: LEFT SUPERIOR PARATHYROIDECTOMY;  Surgeon: Armandina Gemma, MD;  Location: [facility];  Service: General;  Laterality: Left;  . tumor ear Left age 8    topical growth behind left ear.   FHx:    Reviewed / unchanged  SHx:    Reviewed / unchanged  Systems Review:  Constitutional: Denies fever, chills, wt changes, headaches, insomnia, fatigue, night sweats, change in appetite. Eyes: Denies redness, blurred vision, diplopia, discharge, itchy, watery eyes.  ENT: Denies discharge, congestion, post nasal drip, epistaxis, sore throat, earache, hearing loss, dental pain, tinnitus, vertigo, sinus pain, snoring.  CV: Denies chest pain, palpitations, irregular heartbeat, syncope, dyspnea, diaphoresis, orthopnea, PND, claudication or edema. Respiratory: denies cough, dyspnea, DOE, pleurisy, hoarseness, laryngitis, wheezing.  Gastrointestinal: Denies dysphagia, odynophagia, heartburn, reflux, water brash, abdominal pain  or cramps, nausea, vomiting, bloating, diarrhea, constipation, hematemesis, melena, hematochezia  or hemorrhoids. Genitourinary: Denies dysuria, frequency, urgency, nocturia, hesitancy, discharge, hematuria or flank pain. Musculoskeletal: Denies arthralgias, myalgias, stiffness, jt. swelling, pain, limping or strain/sprain.  Skin: Denies pruritus, rash, hives, warts, acne, eczema or change in skin lesion(s). Neuro: No weakness, tremor, incoordination, spasms, paresthesia or pain. Psychiatric: Denies confusion, memory loss or sensory loss. Endo: Denies change in weight, skin or hair change.  Heme/Lymph: No excessive bleeding, bruising or enlarged lymph nodes.  Physical Exam BP 110/72   Pulse 60   Temp 97.5 F (36.4 C)   Resp 16   Ht 5\' 11"  (1.803 m)   Wt 256 lb 9.6 oz (116.4 kg)   BMI 35.79 kg/m   Appears well nourished and in no distress.  Eyes: PERRLA, EOMs, conjunctiva no swelling or erythema. Sinuses: No frontal/maxillary tenderness ENT/Mouth: EAC's clear, TM's nl w/o erythema, bulging. Nares clear w/o erythema, swelling, exudates. Oropharynx clear without erythema or exudates. Oral  hygiene is good. Tongue normal, non obstructing. Hearing intact.  Neck: Supple. Thyroid nl. Car 2+/2+ without bruits, nodes or JVD. Chest: Respirations nl with BS clear & equal w/o rales, rhonchi, wheezing or stridor.  Cor: Heart sounds normal w/ regular rate and rhythm without sig. murmurs, gallops, clicks, or rubs. Peripheral pulses normal and equal  without edema.  Abdomen: Soft & bowel sounds normal. Non-tender w/o guarding, rebound, hernias, masses, or organomegaly.  Lymphatics: Unremarkable.  Musculoskeletal: Full ROM all peripheral extremities, joint stability, 5/5 strength, and normal gait.  Skin: Warm, dry without exposed rashes, lesions or ecchymosis apparent.  Neuro: Cranial nerves intact, reflexes equal bilaterally. Sensory-motor testing grossly intact. Tendon reflexes grossly intact.   Pysch: Alert & oriented x 3.  Insight and judgement nl & appropriate. No ideations.  Assessment and Plan:  1. Essential hypertension  - Continue medication, monitor blood pressure at home.  - Continue DASH diet.  - Reminder to go to the ER if any CP, SOB, nausea, dizziness,  severe HA, changes vision/speech, left arm numbness and tingling and jaw pain. - TSH  2. Mixed hyperlipidemia  - Continue diet/meds, exercise,& lifestyle modifications.  - Continue monitor periodic cholesterol/liver & renal functions  - Lipid panel - TSH  3. Prediabetes  - Continue diet, exercise, lifestyle modifications.  - Monitor appropriate labs. - Hemoglobin A1c - Insulin, random  4. Vitamin D deficiency  - Continue supplementation. - VITAMIN D 25 Hydroxy   5. Gastroesophageal reflux disease   6. Medication management  - CBC with Differential/Platelet - BASIC METABOLIC PANEL WITH GFR - Hepatic function panel - Magnesium      Recommended regular exercise, BP monitoring, weight control, and discussed med and SE's. Recommended labs to assess and monitor clinical status. Further disposition pending results of labs. Over 30 minutes of exam, counseling, chart review was performed

## 2016-01-06 NOTE — Patient Instructions (Signed)

## 2016-01-07 LAB — BASIC METABOLIC PANEL WITH GFR
BUN: 23 mg/dL (ref 7–25)
CO2: 22 mmol/L (ref 20–31)
Calcium: 10.1 mg/dL (ref 8.6–10.3)
Chloride: 101 mmol/L (ref 98–110)
Creat: 2.13 mg/dL — ABNORMAL HIGH (ref 0.70–1.18)
GFR, Est African American: 35 mL/min — ABNORMAL LOW (ref >=60)
GFR, Est Non African American: 30 mL/min — ABNORMAL LOW (ref >=60)
Glucose, Bld: 109 mg/dL — ABNORMAL HIGH (ref 65–99)
Potassium: 4.6 mmol/L (ref 3.5–5.3)
Sodium: 136 mmol/L (ref 135–146)

## 2016-01-07 LAB — HEMOGLOBIN A1C
Hgb A1c MFr Bld: 5.6 % (ref <5.7)
Mean Plasma Glucose: 114 mg/dL

## 2016-01-07 LAB — CBC WITH DIFFERENTIAL/PLATELET
Basophils Absolute: 0 cells/uL (ref 0–200)
Basophils Relative: 0 %
Eosinophils Absolute: 352 cells/uL (ref 15–500)
Eosinophils Relative: 4 %
HCT: 45.4 % (ref 38.5–50.0)
Hemoglobin: 15.8 g/dL (ref 13.2–17.1)
Lymphocytes Relative: 30 %
Lymphs Abs: 2640 cells/uL (ref 850–3900)
MCH: 32.2 pg (ref 27.0–33.0)
MCHC: 34.8 g/dL (ref 32.0–36.0)
MCV: 92.7 fL (ref 80.0–100.0)
MPV: 10.7 fL (ref 7.5–12.5)
Monocytes Absolute: 880 cells/uL (ref 200–950)
Monocytes Relative: 10 %
Neutro Abs: 4928 cells/uL (ref 1500–7800)
Neutrophils Relative %: 56 %
Platelets: 298 10*3/uL (ref 140–400)
RBC: 4.9 MIL/uL (ref 4.20–5.80)
RDW: 12.9 % (ref 11.0–15.0)
WBC: 8.8 10*3/uL (ref 3.8–10.8)

## 2016-01-07 LAB — HEPATIC FUNCTION PANEL
ALT: 23 U/L (ref 9–46)
AST: 24 U/L (ref 10–35)
Albumin: 4.4 g/dL (ref 3.6–5.1)
Alkaline Phosphatase: 25 U/L — ABNORMAL LOW (ref 40–115)
Bilirubin, Direct: 0.2 mg/dL (ref <=0.2)
Indirect Bilirubin: 0.6 mg/dL (ref 0.2–1.2)
Total Bilirubin: 0.8 mg/dL (ref 0.2–1.2)
Total Protein: 6.9 g/dL (ref 6.1–8.1)

## 2016-01-07 LAB — LIPID PANEL
Cholesterol: 199 mg/dL (ref <200)
HDL: 52 mg/dL (ref >40)
LDL Cholesterol: 108 mg/dL — ABNORMAL HIGH
Total CHOL/HDL Ratio: 3.8 Ratio (ref <5.0)
Triglycerides: 196 mg/dL — ABNORMAL HIGH (ref <150)
VLDL: 39 mg/dL — ABNORMAL HIGH (ref <30)

## 2016-01-07 LAB — INSULIN, RANDOM: Insulin: 41.9 u[IU]/mL — ABNORMAL HIGH (ref 2.0–19.6)

## 2016-01-07 LAB — VITAMIN D 25 HYDROXY (VIT D DEFICIENCY, FRACTURES): Vit D, 25-Hydroxy: 60 ng/mL (ref 30–100)

## 2016-01-07 LAB — MAGNESIUM: Magnesium: 2 mg/dL (ref 1.5–2.5)

## 2016-01-07 LAB — TSH: TSH: 2.85 mIU/L (ref 0.40–4.50)

## 2016-01-09 DIAGNOSIS — M1612 Unilateral primary osteoarthritis, left hip: Secondary | ICD-10-CM | POA: Diagnosis not present

## 2016-01-13 ENCOUNTER — Ambulatory Visit: Payer: Self-pay | Admitting: Internal Medicine

## 2016-01-13 DIAGNOSIS — N200 Calculus of kidney: Secondary | ICD-10-CM | POA: Diagnosis not present

## 2016-02-21 DIAGNOSIS — N1330 Unspecified hydronephrosis: Secondary | ICD-10-CM | POA: Diagnosis not present

## 2016-02-21 DIAGNOSIS — N200 Calculus of kidney: Secondary | ICD-10-CM | POA: Diagnosis not present

## 2016-02-26 ENCOUNTER — Other Ambulatory Visit: Payer: Self-pay | Admitting: Urology

## 2016-02-26 NOTE — Progress Notes (Signed)
Scheduling pre op appt- please add SURGICAL ORDERS IN EPIC  THANKS

## 2016-03-05 NOTE — Patient Instructions (Addendum)
[name] [name]  03/05/2016   Your procedure is scheduled on: 03-09-16  Report to [facility]  Entrance take [facility]  elevators to 3rd floor to  [place] at    0600 AM.  Call this number if you have problems the morning of surgery [phone]   Remember: ONLY 1 PERSON MAY GO WITH YOU TO SHORT STAY TO GET  READY MORNING OF [place].  Do not eat food or drink liquids :After Midnight.     Take these medicines the morning of surgery with A SIP OF WATER: Fenofibrate. Omeprazole. Tamsulosin.                                You may not have any metal on your body including hair pins and              piercings  Do not wear jewelry, make-up, lotions, powders or perfumes, deodorant             Do not wear nail polish.  Do not shave  48 hours prior to surgery.              Men may shave face and neck.   Do not bring valuables to the hospital. [place].  Contacts, dentures or bridgework may not be worn into surgery.  Leave suitcase in the car. After surgery it may be brought to your room.     Patients discharged the day of surgery will not be allowed to drive home.  Name and phone number of your driver: Helaine Chess (daughter) [phone] (m)              Please read over the following fact sheets you were given: _____________________________________________________________________             [facility] - Preparing for Surgery Before surgery, you can play an important role.  Because skin is not sterile, your skin needs to be as free of germs as possible.  You can reduce the number of germs on your skin by washing with CHG (chlorahexidine gluconate) soap before surgery.  CHG is an antiseptic cleaner which kills germs and bonds with the skin to continue killing germs even after washing. Please DO NOT use if you have an allergy to CHG or antibacterial soaps.  If your skin becomes reddened/irritated stop  using the CHG and inform your nurse when you arrive at Short Stay. Do not shave (including legs and underarms) for at least 48 hours prior to the first CHG shower.  You may shave your face/neck. Please follow these instructions carefully:  1.  Shower with CHG Soap the night before surgery and the  morning of Surgery.  2.  If you choose to wash your hair, wash your hair first as usual with your  normal  shampoo.  3.  After you shampoo, rinse your hair and body thoroughly to remove the  shampoo.                           4.  Use CHG as you would any other liquid soap.  You can apply chg directly  to the skin and wash  Gently with a scrungie or clean washcloth.  5.  Apply the CHG Soap to your body ONLY FROM THE NECK DOWN.   Do not use on face/ open                           Wound or open sores. Avoid contact with eyes, ears mouth and genitals (private parts).                       Wash face,  Genitals (private parts) with your normal soap.             6.  Wash thoroughly, paying special attention to the area where your surgery  will be performed.  7.  Thoroughly rinse your body with warm water from the neck down.  8.  DO NOT shower/wash with your normal soap after using and rinsing off  the CHG Soap.                9.  Pat yourself dry with a clean towel.            10.  Wear clean pajamas.            11.  Place clean sheets on your bed the night of your first shower and do not  sleep with pets. Day of Surgery : Do not apply any lotions/deodorants the morning of surgery.  Please wear clean clothes to the hospital/surgery center.  FAILURE TO FOLLOW THESE INSTRUCTIONS MAY RESULT IN THE CANCELLATION OF YOUR SURGERY PATIENT SIGNATURE_________________________________  NURSE SIGNATURE__________________________________  ________________________________________________________________________

## 2016-03-06 ENCOUNTER — Encounter (HOSPITAL_COMMUNITY)
Admission: RE | Admit: 2016-03-06 | Discharge: 2016-03-06 | Disposition: A | Discharge status: Home / Self Care | Payer: Medicare Other | Source: Ambulatory Visit | Attending: Urology | Admitting: Urology

## 2016-03-06 ENCOUNTER — Encounter (INDEPENDENT_AMBULATORY_CARE_PROVIDER_SITE_OTHER): Payer: Self-pay

## 2016-03-06 ENCOUNTER — Encounter (HOSPITAL_COMMUNITY): Payer: Self-pay

## 2016-03-06 DIAGNOSIS — Z791 Long term (current) use of non-steroidal anti-inflammatories (NSAID): Secondary | ICD-10-CM | POA: Diagnosis not present

## 2016-03-06 DIAGNOSIS — Z87891 Personal history of nicotine dependence: Secondary | ICD-10-CM | POA: Diagnosis not present

## 2016-03-06 DIAGNOSIS — I10 Essential (primary) hypertension: Secondary | ICD-10-CM | POA: Diagnosis not present

## 2016-03-06 DIAGNOSIS — Z7982 Long term (current) use of aspirin: Secondary | ICD-10-CM | POA: Diagnosis not present

## 2016-03-06 DIAGNOSIS — N132 Hydronephrosis with renal and ureteral calculous obstruction: Secondary | ICD-10-CM | POA: Diagnosis not present

## 2016-03-06 DIAGNOSIS — J449 Chronic obstructive pulmonary disease, unspecified: Secondary | ICD-10-CM | POA: Diagnosis not present

## 2016-03-06 DIAGNOSIS — N400 Enlarged prostate without lower urinary tract symptoms: Secondary | ICD-10-CM | POA: Diagnosis not present

## 2016-03-06 DIAGNOSIS — K219 Gastro-esophageal reflux disease without esophagitis: Secondary | ICD-10-CM | POA: Diagnosis not present

## 2016-03-06 DIAGNOSIS — E785 Hyperlipidemia, unspecified: Secondary | ICD-10-CM | POA: Diagnosis not present

## 2016-03-06 DIAGNOSIS — Z79899 Other long term (current) drug therapy: Secondary | ICD-10-CM | POA: Diagnosis not present

## 2016-03-06 LAB — BASIC METABOLIC PANEL
Anion gap: 10 (ref 5–15)
BUN: 29 mg/dL — ABNORMAL HIGH (ref 6–20)
CO2: 22 mmol/L (ref 22–32)
Calcium: 9.9 mg/dL (ref 8.9–10.3)
Chloride: 104 mmol/L (ref 101–111)
Creatinine, Ser: 2.14 mg/dL — ABNORMAL HIGH (ref 0.61–1.24)
GFR calc Af Amer: 34 mL/min — ABNORMAL LOW (ref >60)
GFR calc non Af Amer: 29 mL/min — ABNORMAL LOW (ref >60)
Glucose, Bld: 133 mg/dL — ABNORMAL HIGH (ref 65–99)
Potassium: 4.1 mmol/L (ref 3.5–5.1)
Sodium: 136 mmol/L (ref 135–145)

## 2016-03-06 LAB — CBC
HCT: 42.5 % (ref 39.0–52.0)
Hemoglobin: 15 g/dL (ref 13.0–17.0)
MCH: 32.2 pg (ref 26.0–34.0)
MCHC: 35.3 g/dL (ref 30.0–36.0)
MCV: 91.2 fL (ref 78.0–100.0)
Platelets: 285 10*3/uL (ref 150–400)
RBC: 4.66 MIL/uL (ref 4.22–5.81)
RDW: 12.4 % (ref 11.5–15.5)
WBC: 8.3 10*3/uL (ref 4.0–10.5)

## 2016-03-06 NOTE — Pre-Procedure Instructions (Signed)
EKG 06/27/15 epic

## 2016-03-06 NOTE — Progress Notes (Signed)
   03/06/16 0850  OBSTRUCTIVE SLEEP APNEA  Have you ever been diagnosed with sleep apnea through a sleep study? No  Do you snore loudly (loud enough to be heard through closed doors)?  1  Do you often feel tired, fatigued, or sleepy during the daytime (such as falling asleep during driving or talking to someone)? 0  Has anyone observed you stop breathing during your sleep? 0  Do you have, or are you being treated for high blood pressure? 1  BMI more than 35 kg/m2? 0  Age > 50 (1-yes) 1  Neck circumference greater than:Male 16 inches or larger, Male 17inches or larger? 1  Male Gender (Yes=1) 1  Obstructive Sleep Apnea Score 5

## 2016-03-07 LAB — HEMOGLOBIN A1C
Hgb A1c MFr Bld: 5.9 % — ABNORMAL HIGH (ref 4.8–5.6)
Mean Plasma Glucose: 123 mg/dL

## 2016-03-08 NOTE — Anesthesia Preprocedure Evaluation (Addendum)
Anesthesia Evaluation  Patient identified by MRN, date of birth, ID band Patient awake    Reviewed: Allergy & Precautions, NPO status , Patient's Chart, lab work & pertinent test results  History of Anesthesia Complications Negative for: history of anesthetic complications  Airway Mallampati: II  TM Distance: >3 FB Neck ROM: Full    Dental no notable dental hx. (+) Dental Advisory Given   Pulmonary COPD, former smoker,  Patient denies COPD. Does not use inhalers.   Pulmonary exam normal        Cardiovascular hypertension, negative cardio ROS Normal cardiovascular exam     Neuro/Psych negative neurological ROS  negative psych ROS   GI/Hepatic Neg liver ROS, GERD  ,  Endo/Other  negative endocrine ROS  Renal/GU Renal disease  negative genitourinary   Musculoskeletal  (+) Arthritis ,   Abdominal (+) + obese,   Peds negative pediatric ROS (+)  Hematology negative hematology ROS (+)   Anesthesia Other Findings   Reproductive/Obstetrics negative OB ROS                            Anesthesia Physical  Anesthesia Plan  ASA: III  Anesthesia Plan: General   Post-op Pain Management:    Induction: Intravenous  Airway Management Planned: LMA  Additional Equipment:   Intra-op Plan:   Post-operative Plan: Extubation in OR  Informed Consent: I have reviewed the patients History and Physical, chart, labs and discussed the procedure including the risks, benefits and alternatives for the proposed anesthesia with the patient or authorized representative who has indicated his/her understanding and acceptance.   Dental advisory given  Plan Discussed with: CRNA, Anesthesiologist and Surgeon  Anesthesia Plan Comments:        Anesthesia Quick Evaluation

## 2016-03-09 ENCOUNTER — Encounter (HOSPITAL_COMMUNITY)
Admission: RE | Disposition: A | Discharge status: Home / Self Care | Payer: Self-pay | Source: Ambulatory Visit | Attending: Urology

## 2016-03-09 ENCOUNTER — Ambulatory Visit (HOSPITAL_COMMUNITY): Payer: Medicare Other | Admitting: Anesthesiology

## 2016-03-09 ENCOUNTER — Encounter (HOSPITAL_COMMUNITY): Payer: Self-pay | Admitting: *Deleted

## 2016-03-09 ENCOUNTER — Ambulatory Visit (HOSPITAL_COMMUNITY)
Admission: RE | Admit: 2016-03-09 | Discharge: 2016-03-09 | Disposition: A | Discharge status: Home / Self Care | Payer: Medicare Other | Source: Ambulatory Visit | Attending: Urology | Admitting: Urology

## 2016-03-09 DIAGNOSIS — Z87891 Personal history of nicotine dependence: Secondary | ICD-10-CM | POA: Diagnosis not present

## 2016-03-09 DIAGNOSIS — N132 Hydronephrosis with renal and ureteral calculous obstruction: Secondary | ICD-10-CM | POA: Insufficient documentation

## 2016-03-09 DIAGNOSIS — J449 Chronic obstructive pulmonary disease, unspecified: Secondary | ICD-10-CM | POA: Diagnosis not present

## 2016-03-09 DIAGNOSIS — I10 Essential (primary) hypertension: Secondary | ICD-10-CM | POA: Diagnosis not present

## 2016-03-09 DIAGNOSIS — E785 Hyperlipidemia, unspecified: Secondary | ICD-10-CM | POA: Diagnosis not present

## 2016-03-09 DIAGNOSIS — Z79899 Other long term (current) drug therapy: Secondary | ICD-10-CM | POA: Insufficient documentation

## 2016-03-09 DIAGNOSIS — N400 Enlarged prostate without lower urinary tract symptoms: Secondary | ICD-10-CM | POA: Insufficient documentation

## 2016-03-09 DIAGNOSIS — K219 Gastro-esophageal reflux disease without esophagitis: Secondary | ICD-10-CM | POA: Insufficient documentation

## 2016-03-09 DIAGNOSIS — Z7982 Long term (current) use of aspirin: Secondary | ICD-10-CM | POA: Insufficient documentation

## 2016-03-09 DIAGNOSIS — Z791 Long term (current) use of non-steroidal anti-inflammatories (NSAID): Secondary | ICD-10-CM | POA: Diagnosis not present

## 2016-03-09 DIAGNOSIS — N201 Calculus of ureter: Secondary | ICD-10-CM | POA: Diagnosis not present

## 2016-03-09 DIAGNOSIS — N131 Hydronephrosis with ureteral stricture, not elsewhere classified: Secondary | ICD-10-CM | POA: Diagnosis not present

## 2016-03-09 HISTORY — PX: CYSTOSCOPY/URETEROSCOPY/HOLMIUM LASER/STENT PLACEMENT: SHX6546

## 2016-03-09 SURGERY — CYSTOSCOPY/URETEROSCOPY/HOLMIUM LASER/STENT PLACEMENT
Anesthesia: General | Laterality: Left

## 2016-03-09 MED ORDER — BISOPROLOL-HYDROCHLOROTHIAZIDE 5-6.25 MG PO TABS
1.0000 | ORAL_TABLET | Freq: Every day | ORAL | Status: DC
  Administered 2016-03-09: 1 via ORAL
  Filled 2016-03-09: qty 1

## 2016-03-09 MED ORDER — LACTATED RINGERS IV SOLN
INTRAVENOUS | Status: DC | PRN
  Administered 2016-03-09 (×2): via INTRAVENOUS

## 2016-03-09 MED ORDER — CEFAZOLIN SODIUM-DEXTROSE 2-4 GM/100ML-% IV SOLN
INTRAVENOUS | Status: AC
  Filled 2016-03-09: qty 100

## 2016-03-09 MED ORDER — EPHEDRINE SULFATE 50 MG/ML IJ SOLN
INTRAMUSCULAR | Status: DC | PRN
  Administered 2016-03-09: 5 mg via INTRAVENOUS

## 2016-03-09 MED ORDER — LIDOCAINE HCL (CARDIAC) 10 MG/ML IV SOLN
INTRAVENOUS | Status: DC | PRN
  Administered 2016-03-09: 100 mg via INTRAVENOUS

## 2016-03-09 MED ORDER — DEXAMETHASONE SODIUM PHOSPHATE 10 MG/ML IJ SOLN
INTRAMUSCULAR | Status: DC | PRN
  Administered 2016-03-09: 10 mg via INTRAVENOUS

## 2016-03-09 MED ORDER — MIDAZOLAM HCL 2 MG/2ML IJ SOLN
INTRAMUSCULAR | Status: AC
  Filled 2016-03-09: qty 2

## 2016-03-09 MED ORDER — CEPHALEXIN 500 MG PO CAPS: 500.0000 mg | ORAL_CAPSULE | Freq: Two times a day (BID) | ORAL | 0 refills | Status: DC

## 2016-03-09 MED ORDER — MIDAZOLAM HCL 5 MG/5ML IJ SOLN
INTRAMUSCULAR | Status: DC | PRN
  Administered 2016-03-09: 2 mg via INTRAVENOUS

## 2016-03-09 MED ORDER — FENTANYL CITRATE (PF) 100 MCG/2ML IJ SOLN
INTRAMUSCULAR | Status: AC
  Filled 2016-03-09: qty 2

## 2016-03-09 MED ORDER — DEXAMETHASONE SODIUM PHOSPHATE 10 MG/ML IJ SOLN
INTRAMUSCULAR | Status: AC
  Filled 2016-03-09: qty 1

## 2016-03-09 MED ORDER — ONDANSETRON HCL 4 MG/2ML IJ SOLN
INTRAMUSCULAR | Status: AC
  Filled 2016-03-09: qty 2

## 2016-03-09 MED ORDER — FENTANYL CITRATE (PF) 100 MCG/2ML IJ SOLN
INTRAMUSCULAR | Status: DC | PRN
  Administered 2016-03-09 (×2): 50 ug via INTRAVENOUS

## 2016-03-09 MED ORDER — PROMETHAZINE HCL 25 MG/ML IJ SOLN: 6.2500 mg | INTRAMUSCULAR | Status: DC | PRN

## 2016-03-09 MED ORDER — CEFAZOLIN SODIUM-DEXTROSE 2-4 GM/100ML-% IV SOLN
2.0000 g | INTRAVENOUS | Status: AC
  Administered 2016-03-09: 2 g via INTRAVENOUS

## 2016-03-09 MED ORDER — PHENYLEPHRINE HCL 10 MG/ML IJ SOLN
INTRAMUSCULAR | Status: AC
  Filled 2016-03-09: qty 1

## 2016-03-09 MED ORDER — OXYBUTYNIN CHLORIDE 5 MG PO TABS: 5.0000 mg | ORAL_TABLET | Freq: Three times a day (TID) | ORAL | 1 refills | Status: DC | PRN

## 2016-03-09 MED ORDER — PHENYLEPHRINE HCL 10 MG/ML IJ SOLN
INTRAVENOUS | Status: DC | PRN
  Administered 2016-03-09: 25 ug/min via INTRAVENOUS

## 2016-03-09 MED ORDER — ONDANSETRON HCL 4 MG/2ML IJ SOLN
INTRAMUSCULAR | Status: DC | PRN
Start: 2016-03-09 — End: 2016-03-09
  Administered 2016-03-09: 4 mg via INTRAVENOUS

## 2016-03-09 MED ORDER — SCOPOLAMINE 1 MG/3DAYS TD PT72
1.0000 | MEDICATED_PATCH | TRANSDERMAL | Status: DC
  Administered 2016-03-09: 1 via TRANSDERMAL

## 2016-03-09 MED ORDER — PHENYLEPHRINE HCL 10 MG/ML IJ SOLN
INTRAMUSCULAR | Status: DC | PRN
  Administered 2016-03-09 (×3): 120 ug via INTRAVENOUS

## 2016-03-09 MED ORDER — EPHEDRINE 5 MG/ML INJ
INTRAVENOUS | Status: AC
  Filled 2016-03-09: qty 10

## 2016-03-09 MED ORDER — IOPAMIDOL (ISOVUE-300) INJECTION 61%
INTRAVENOUS | Status: DC | PRN
  Administered 2016-03-09: 5 mL via URETHRAL

## 2016-03-09 MED ORDER — ACETAMINOPHEN 10 MG/ML IV SOLN
INTRAVENOUS | Status: DC | PRN
  Administered 2016-03-09: 1000 mg via INTRAVENOUS

## 2016-03-09 MED ORDER — ACETAMINOPHEN 10 MG/ML IV SOLN
INTRAVENOUS | Status: AC
  Filled 2016-03-09: qty 100

## 2016-03-09 MED ORDER — PHENYLEPHRINE 40 MCG/ML (10ML) SYRINGE FOR IV PUSH (FOR BLOOD PRESSURE SUPPORT)
PREFILLED_SYRINGE | INTRAVENOUS | Status: AC
  Filled 2016-03-09: qty 10

## 2016-03-09 MED ORDER — PROPOFOL 10 MG/ML IV BOLUS
INTRAVENOUS | Status: DC | PRN
  Administered 2016-03-09: 200 mg via INTRAVENOUS
  Administered 2016-03-09 (×2): 50 mg via INTRAVENOUS

## 2016-03-09 MED ORDER — HYDROMORPHONE HCL 1 MG/ML IJ SOLN: 0.2500 mg | INTRAMUSCULAR | Status: DC | PRN

## 2016-03-09 MED ORDER — OXYBUTYNIN CHLORIDE 5 MG PO TABS: 5.0000 mg | ORAL_TABLET | Freq: Three times a day (TID) | ORAL | Status: DC | PRN

## 2016-03-09 MED ORDER — SCOPOLAMINE 1 MG/3DAYS TD PT72
MEDICATED_PATCH | TRANSDERMAL | Status: AC
  Filled 2016-03-09: qty 1

## 2016-03-09 MED ORDER — PROPOFOL 10 MG/ML IV BOLUS
INTRAVENOUS | Status: AC
  Filled 2016-03-09: qty 40

## 2016-03-09 SURGICAL SUPPLY — 25 items
BAG URO CATCHER STRL LF (MISCELLANEOUS) ×2 IMPLANT
BASKET DAKOTA 1.9FR 11X120 (BASKET) ×1 IMPLANT
BASKET LASER NITINOL 1.9FR (BASKET) ×1 IMPLANT
BASKET ZERO TIP NITINOL 2.4FR (BASKET) IMPLANT
BSKT STON RTRVL 120 1.9FR (BASKET)
BSKT STON RTRVL ZERO TP 2.4FR (BASKET)
CATH INTERMIT  6FR 70CM (CATHETERS) IMPLANT
CLOTH BEACON ORANGE TIMEOUT ST (SAFETY) ×2 IMPLANT
FIBER LASER FLEXIVA 365 (UROLOGICAL SUPPLIES) IMPLANT
FIBER LASER TRAC TIP (UROLOGICAL SUPPLIES) IMPLANT
GLOVE BIOGEL M 8.0 STRL (GLOVE) ×2 IMPLANT
GOWN STRL REUS W/ TWL XL LVL3 (GOWN DISPOSABLE) ×1 IMPLANT
GOWN STRL REUS W/TWL LRG LVL3 (GOWN DISPOSABLE) ×2 IMPLANT
GOWN STRL REUS W/TWL XL LVL3 (GOWN DISPOSABLE) ×2
GUIDEWIRE ANG ZIPWIRE 038X150 (WIRE) ×2 IMPLANT
GUIDEWIRE STR DUAL SENSOR (WIRE) ×2 IMPLANT
IV NS 1000ML (IV SOLUTION) ×2
IV NS 1000ML BAXH (IV SOLUTION) ×1 IMPLANT
KIT BALLIN UROMAX 15FX10 (LABEL) IMPLANT
MANIFOLD NEPTUNE II (INSTRUMENTS) ×2 IMPLANT
PACK CYSTO (CUSTOM PROCEDURE TRAY) ×2 IMPLANT
SET HIGH PRES BAL DIL (LABEL) ×1
SHEATH ACCESS URETERAL 38CM (SHEATH) ×1 IMPLANT
STENT URET 6FRX24 CONTOUR (STENTS) ×1 IMPLANT
TUBING CONNECTING 10 (TUBING) ×2 IMPLANT

## 2016-03-09 NOTE — Discharge Instructions (Signed)
POSTOPERATIVE CARE AFTER URETEROSCOPY  Stent management  *Stents are often left in after ureteroscopy and stone treatment. If left in, they often cause urinary frequency, urgency, occasional blood in the urine, as well as flank discomfort with urination. These are all expected issues, and should resolve after the stent is removed.   Diet  Once you have adequately recovered from anesthesia, you may gradually advance your diet, as tolerated, to your regular diet.  Activities  You may gradually increase your activities to your normal unrestricted level the day following your procedure.  Medications  You should resume all preoperative medications. If you are on aspirin-like compounds, you should not resume these until the blood clears from your urine. If given an antibiotic by the surgeon, take these until they are completed. You may also be given, if you have a stent, medications to decrease the urinary frequency and urgency.  Pain  After ureteroscopy, there may be some pain on the side of the scope. Take your pain medicine for this. Usually, this pain resolves within a day or 2.  Fever  Please report any fever over 100 to the doctor.   General Anesthesia, Adult, Care After These instructions provide you with information about caring for yourself after your procedure. Your health care provider may also give you more specific instructions. Your treatment has been planned according to current medical practices, but problems sometimes occur. Call your health care provider if you have any problems or questions after your procedure. What can I expect after the procedure? After the procedure, it is common to have:  Vomiting.  A sore throat.  Mental slowness. It is common to feel:  Nauseous.  Cold or shivery.  Sleepy.  Tired.  Sore or achy, even in parts of your body where you did not have surgery. Follow these instructions at home: For at least 24 hours after the  procedure:  Do not:  Participate in activities where you could fall or become injured.  Drive.  Use heavy machinery.  Drink alcohol.  Take sleeping pills or medicines that cause drowsiness.  Make important decisions or sign legal documents.  Take care of children on your own.  Rest. Eating and drinking  If you vomit, drink water, juice, or soup when you can drink without vomiting.  Drink enough fluid to keep your urine clear or pale yellow.  Make sure you have little or no nausea before eating solid foods.  Follow the diet recommended by your health care provider. General instructions  Have a responsible adult stay with you until you are awake and alert.  Return to your normal activities as told by your health care provider. Ask your health care provider what activities are safe for you.  Take over-the-counter and prescription medicines only as told by your health care provider.  If you smoke, do not smoke without supervision.  Keep all follow-up visits as told by your health care provider. This is important. Contact a health care provider if:  You continue to have nausea or vomiting at home, and medicines are not helpful.  You cannot drink fluids or start eating again.  You cannot urinate after 8-12 hours.  You develop a skin rash.  You have fever.  You have increasing redness at the site of your procedure. Get help right away if:  You have difficulty breathing.  You have chest pain.  You have unexpected bleeding.  You feel that you are having a life-threatening or urgent problem. This information is not intended  to replace advice given to you by your health care provider. Make sure you discuss any questions you have with your health care provider. Document Released: 05/25/2000 Document Revised: 07/22/2015 Document Reviewed: 01/31/2015 Elsevier Interactive Patient Education  2017 [employer].

## 2016-03-09 NOTE — Transfer of Care (Signed)
Immediate Anesthesia Transfer of Care Note  Patient: ETHANMICHAEL FLORES  Procedure(s) Performed: Procedure(s): CYSTOSCOPY/RETROGRADE PYELOGRAM/URETEROSCOPY/BASKET STONE EXTRACTION/STENT PLACEMENT (Left)  Patient Location: PACU  Anesthesia Type:General  Level of Consciousness: awake, alert , oriented and patient cooperative  Airway & Oxygen Therapy: Patient Spontanous Breathing and Patient connected to face mask oxygen  Post-op Assessment: Report given to RN, Post -op Vital signs reviewed and stable and Patient moving all extremities X 4  Post vital signs: stable  Last Vitals:  Vitals:   03/09/16 0618 03/09/16 0900  BP: (!) 146/92 112/66  Pulse: 62 71  Resp: 20 (!) 21  Temp: 36.6 C     Last Pain:  Vitals:   03/09/16 0618  TempSrc: Oral      Patients Stated Pain Goal: 4 ([phone])  Complications: No apparent anesthesia complications

## 2016-03-09 NOTE — Anesthesia Postprocedure Evaluation (Signed)
Anesthesia Post Note  Patient: [name] [name]  Procedure(s) Performed: Procedure(s) (LRB): CYSTOSCOPY/RETROGRADE PYELOGRAM/URETEROSCOPY/BASKET STONE EXTRACTION/STENT PLACEMENT (Left)  Patient location during evaluation: PACU Anesthesia Type: General Level of consciousness: sedated Pain management: pain level controlled Vital Signs Assessment: post-procedure vital signs reviewed and stable Respiratory status: spontaneous breathing and respiratory function stable Cardiovascular status: stable Anesthetic complications: no       Last Vitals:  Vitals:   03/09/16 0941 03/09/16 1016  BP: 112/64 127/76  Pulse: (!) 58 61  Resp: 14 16  Temp: 36.4 C 36.5 C    Last Pain:  Vitals:   03/09/16 1016  TempSrc: Oral  PainSc: 5                  Dequita Schleicher DANIEL

## 2016-03-09 NOTE — H&P (Signed)
Urology History and Physical Exam  CC: Left sided stones, blockage  HPI: 76 year old male presents for ureteroscopic mgmt of left hydro and small ureteral stones seen on renal U/S and subsequent CT stone protocol from late Dec 2017. He underwent left URS with HLL and extraction of multiple left ureteral and renal calculi on 12/30/2015. He presents now for further treatment of 1 remaining lower pole stone as well as a 3x5 mm left mid/distal ureterrl stone.  PMH: Past Medical History:  Diagnosis Date  . Arthritis   . COPD (chronic obstructive pulmonary disease) ([place])   . ED (erectile dysfunction)   . Enlarged prostate with lower urinary tract symptoms (LUTS)   . GERD (gastroesophageal reflux disease)   . History of chronic gastritis   . History of colon polyps    hyperplastic 2006  . History of esophageal stricture    S/P  DILATATION 2009; 2010; 2010 2012  . History of kidney stones    hx. multiple kidney stones  . History of kidney stones   . History of primary hyperparathyroidism    s/p  left superior parathyroidectomy 07-10-2014  . Hyperlipidemia   . Hypertension   . Left ureteral stone   . Nephrolithiasis    left side   . Pre-diabetes     PSH: Past Surgical History:  Procedure Laterality Date  . COLONOSCOPY  02/17/2005  . CYSTO/  LEFT RETROGRADE PYELOGRAM/ STENT PLACEMENT  01/26/2005  . CYSTOSCOPY W/ URETEROSCOPY W/ LITHOTRIPSY Left 05/04/2005  . CYSTOSCOPY WITH STENT PLACEMENT Left 12/30/2015   Procedure: CYSTOSCOPY WITH STENT PLACEMENT;  Surgeon: Franchot Gallo, MD;  [facility];  Service: Urology;  Laterality: Left;  . CYSTOSCOPY/RETROGRADE/URETEROSCOPY/STONE EXTRACTION WITH BASKET Left 12/30/2015   Procedure: CYSTOSCOPY/RETROGRADE/URETEROSCOPY/STONE EXTRACTION WITH BASKET;  Surgeon: Franchot Gallo, MD;  Location: [facility];  Service: Urology;  Laterality: Left;  . ESOPHAGOGASTRODUODENOSCOPY (EGD) WITH ESOPHAGEAL DILATION   x4  last one 07-21-2010  . HOLMIUM LASER APPLICATION Left [id number]   Procedure: HOLMIUM LASER APPLICATION;  Surgeon: Franchot Gallo, MD;  Location: [facility];  Service: Urology;  Laterality: Left;  . INGUINAL HERNIA REPAIR Left yrs ago  . NEPHROLITHOTOMY Left 02/01/2014   Procedure: NEPHROLITHOTOMY PERCUTANEOUS;  Surgeon: Jorja Loa, MD;  Location: [facility];  Service: Urology;  Laterality: Left;  . PARATHYROIDECTOMY Left 07/10/2014   Procedure: LEFT SUPERIOR PARATHYROIDECTOMY;  Surgeon: Armandina Gemma, MD;  Location: [place];  Service: General;  Laterality: Left;  . tumor ear Left age 62    topical growth behind left ear.    Allergies: Allergies  Allergen Reactions  . Iohexol Shortness Of Breath  . Ace Inhibitors     cough    Medications: Prescriptions Prior to Admission  Medication Sig Dispense Refill Last Dose  . Ascorbic Acid (VITAMIN C) 1000 MG tablet Take 1,000 mg by mouth daily.   Taking  . aspirin 81 MG tablet Take 81 mg by mouth daily.   Taking  . B Complex Vitamins (VITAMIN-B COMPLEX PO) Take 1 capsule by mouth every evening. Vegetable formula   Taking  . bisoprolol-hydrochlorothiazide (ZIAC) 5-6.25 MG tablet TAKE 1 TABLET BY MOUTH EVERY MORNING FOR BLOOD PRESSURE 90 tablet 4 Taking  . Cholecalciferol (VITAMIN D-3) 5000 units TABS Take 5,000 Units by mouth 2 (two) times daily.     . fenofibrate (TRICOR) 145 MG tablet TAKE 1 TABLET DAILY FOR TRIGLYCERIDES / BLOOD FATS 90 tablet 2 Taking  . Flaxseed, Linseed, (FLAXSEED OIL PO) Take  1,000 mg by mouth every morning.    Taking  . ibuprofen (ADVIL,MOTRIN) 200 MG tablet Take 400 mg by mouth every 8 (eight) hours as needed (for pain.).     Marland Kitchen losartan (COZAAR) 100 MG tablet TAKE 1 TABLET BY MOUTH DAILY FOR BLOOD PRESSURE. 90 tablet 2 Taking  . magnesium oxide (MAG-OX) 400 MG tablet Take 400 mg by mouth daily.     . meloxicam (MOBIC) 15 MG tablet TAKE 1 TABLET BY MOUTH DAILY. (Patient taking differently: TAKE 1  TABLET (15 MG) DAILY IN THE MORNING.) 90 tablet 11 Taking  . Omega-3 Fatty Acids (OMEGA-3 FISH OIL) 1200 MG CAPS Take 1,200 mg by mouth 2 (two) times daily.   Taking  . omeprazole (PRILOSEC) 40 MG capsule TAKE 1 CAPSULE BY MOUTH DAILY. (Patient taking differently: TAKE 1 CAPSULE (40 MG) BY MOUTH DAILY IN THE MORNING.) 90 capsule 1 Taking  . tamsulosin (FLOMAX) 0.4 MG CAPS capsule Take 0.4 mg by mouth daily after supper.   3 Taking     Social History: Social History   Social History  . Marital status: Married    Spouse name: N/A  . Number of children: N/A  . Years of education: N/A   Occupational History  . Not on file.   Social History Main Topics  . Smoking status: Former Smoker    Years: 8.00    Quit date: 03/02/1974  . Smokeless tobacco: Never Used  . Alcohol use Yes     Comment: occasionally  . Drug use: No  . Sexual activity: Yes   Other Topics Concern  . Not on file   Social History Narrative  . No narrative on file    Family History: No family history on file.  Review of Systems: GU Review Male: Patient reports get up at night to urinate. Patient denies frequent urination, hard to postpone urination, burning/ pain with urination, leakage of urine, stream starts and stops, trouble starting your stream, have to strain to urinate , erection problems, and penile pain. Gastrointestinal (Upper): Patient denies nausea, vomiting, and indigestion/ heartburn. Gastrointestinal (Lower): Patient denies diarrhea and constipation. Constitutional: Patient denies fever, night sweats, weight loss, and fatigue. Skin: Patient denies skin rash/ lesion and itching. Eyes: Patient denies blurred vision and double vision. Ears/ Nose/ Throat: Patient denies sore throat and sinus problems. Hematologic/Lymphatic: Patient denies swollen glands and easy bruising. Cardiovascular: Patient denies leg swelling and chest pains. Respiratory: Patient reports cough. Patient denies shortness of breath.  Endocrine: Patient denies excessive thirst. Musculoskeletal: Patient reports back pain and joint pain. Neurological: Patient denies headaches and dizziness. Psychologic: Patient denies depression and anxiety                 Physical Exam: @VITALS2 @ General: No acute distress.  Awake. Head:  Normocephalic.  Atraumatic. ENT:  EOMI.  Mucous membranes moist Neck:  Supple.  No lymphadenopathy. CV:  S1 present. S2 present. Regular rate. Pulmonary: Equal effort bilaterally.  Clear to auscultation bilaterally. Abdomen: Soft.  Non tender to palpation. Obese. Skin:  Normal turgor.  No visible rash. Extremity: No gross deformity of bilateral upper extremities.  No gross deformity of                             lower extremities. Neurologic: Alert. Appropriate mood.    Studies:  Recent Labs     03/06/16  0903  HGB  15.0  WBC  8.3  PLT  285  Recent Labs     03/06/16  0903  NA  136  K  4.1  CL  104  CO2  22  BUN  29*  CREATININE  2.14*  CALCIUM  9.9  GFRNONAA  29*  GFRAA  34*     No results for input(s): INR, APTT in the last 72 hours.  Invalid input(s): PT   Invalid input(s): ABG    Assessment:  Left mid/dital ureteral, left lower pole stones, left hydronephrosis  Plan: Cysto, left RGP, left URS with extraction of renal/ureteral stones, stent placement

## 2016-03-09 NOTE — Op Note (Signed)
Preoperative diagnosis: Left ureteral stone, left ureteral stricture with hydronephrosis, left renal stone.  Postoperative diagnosis: Left ureteral stone, left ureteral stricture with hydronephrosis, no left renal stones seen.  Principal procedure: Cystoscopy, left retrograde ureteropyelogram with fluoroscopic interpretation, balloon dilation of left ureteral stricture, left ureteroscopy-rigid, flexible, left ureteral stone extraction, placement of 6 by 24 contour double-J stent (without tether) in left ureter  Surgeon: Rene Gonsoulin  Anesthesia: Gen. with LMA  Complications: None  Specimen: Small left ureteral stone, to the patient's wife  Drains: 24 centimeter by 6 French contour stent without tether  Estimated blood loss: None  Indications: 76 year old male status post left ureteroscopic stone extraction, left renal stone extraction in late October, 2017.  He had multiple left renal stones, as well as a long-standing left ureteral stone.  A stent was left in, and removed shortly after the procedure.  Follow-up in December of this year revealed left hydronephrosis with a small left ureteral stone just proximal to a probable left ureteral stricture.  There was a small left lower pole calculus seen.  Because of the patient's probable stricture, related to his left ureteral stone, it was recommended that he undergo cystoscopy, retrograde, possible balloon dilation of his left ureter, left ureteroscopic stone extraction, possible removal of the left renal stone.  The patient is aware of risks and complications of the procedure.  He has undergone prior ureteroscopy's.  He desires to proceed.  Findings: The prostate was minimally obstructive with trilobar hypertrophy.  Bladder revealed normal urothelium.  Minimal trabeculations present.  Ureteral orifices were normal.  The left ureter was evaluated with retrograde ureteropyelogram.  The distal 2-3 centimeters of the ureter was normal.  Proximal to that,  no contrast was able to be injected past the stricture.  Following balloon dilation, the proximal ureter was significantly widened but had no lesions.  One stone was seen in the distal ureter proximal to the stricture.  This was removed.  The kidney was normal with exception of significant pyelocaliectasis.  Randall's plaques were seen, but no discrete stones were seen in any of the calyces.  Description of procedure: The patient was properly identified and marked in the holding area.  He received preoperative IV antibiotics.  He was taken the operating room where general anesthesia was administered with the LMA.  He is placed in the dorsolithotomy position.  Genitalia and perineum were prepped and draped.  Proper timeout was performed.  A 21 French panendoscope was advanced to his urethra and into his bladder.  Above findings were noted.  The retrograde ureteropyelogram was performed using a 6 [place] open-ended catheter and Omnipaque.  There was a significant stricture, without passage of the contrast past 2-3 centimeters up the ureter.  Following retrograde, a guidewire was easily advanced past the stricture and up into the upper pole calyceal system.  I then advanced the open-ended catheter over top of the guidewire into the more proximal ureter.  The guidewire was removed.  Hydronephrotic drip was noted confirming adequate position of the guidewire and open-ended catheter.  The guidewire was replaced, and the open-ended catheter removed.  The cystoscope was also removed after the bladder was drained.  I advanced a 10 centimeter, 15 French balloon over top of the guidewire in the area of the distal ureter, both proximal and distal to the stricture.  I then inflated the balloon to 18 atmospheres of pressure for approximately 5 minutes.  Initially, there was a waist where the stricture was, which was easily dilated.  Once  the ureter had been dilated for approximately 5 minutes, the balloon was deflated and  removed over top the guidewire.  I then advanced a 6 French semirigid, dual-lumen ureteroscope up through the urethra and into the ureter.  The old dilated stricture was seen.  There was one tiny stone just proximal to this.  This was extracted using the engage basket.  No other ureteral calculi were visualized.  This go per was advanced up to the proximal ureter, at approximately the level of the UPJ.  No further stones were encountered.  The semirigid ureteroscope was then removed.  I then placed a 35 centimeter, 12/14 ureteral access catheter over top of the guidewire.  Once adequate position was seen fluoroscopically, the guidewire and the inner core of the sheath were removed.  I then passed the 6 French dual-lumen flexible ureteroscope through the access sheath and in the pyelo-calyceal system.  The entire pyelocalyceal system was carefully inspected, starting with the upper pole calyces, then sequentially down through the interpolar into the lower pole calyces.  Several Randall's plaques were seen.  Sediment/mucoid material was also seen floating within the calyceal systems, but I did not see any significant calculi that needed to be extracted.  Following careful inspection of the entire pyelocalyceal system 2, the flexible ureteroscope was removed.  I passed the sensor-tip guidewire through the access catheter, and remove the access catheter.  The guidewire was backloaded through the scope.  I then passed a 24 centimeter by 6 [place] contour double-J stent, with the string having been removed.  Once the guidewire was removed, the stent was deployed with excellent curl seen proximally and distally using fluoroscopic and cystoscopic visualization, respectively.  At this point, the bladder was drained.  The scope was removed.  The procedure was then terminated.  The patient was then awakened.  He was taken to the PACU in stable condition, having tolerated the procedure well.  There've the patient will  follow-up in approximately one week for urine check.  He was discharged on a 5 day course of Keflex as well as when necessary Ditropan.  The stent will be left in for approximately one month, at which time it can be removed and followed with ultrasound approximately 4-6 weeks later.

## 2016-03-09 NOTE — Anesthesia Procedure Notes (Signed)
Procedure Name: LMA Insertion Date/Time: 03/09/2016 8:10 AM Performed by: Enrigue Catena E Pre-anesthesia Checklist: Patient identified, Emergency Drugs available, Suction available and Patient being monitored Patient Re-evaluated:Patient Re-evaluated prior to inductionOxygen Delivery Method: Circle system utilized Preoxygenation: Pre-oxygenation with 100% oxygen Intubation Type: IV induction Ventilation: Mask ventilation without difficulty LMA: LMA with gastric port inserted LMA Size: 5.0 Tube type: Oral Number of attempts: 1 Airway Equipment and Method: Oral airway Placement Confirmation: positive ETCO2 Tube secured with: Tape Dental Injury: Teeth and Oropharynx as per pre-operative assessment

## 2016-03-13 DIAGNOSIS — L738 Other specified follicular disorders: Secondary | ICD-10-CM | POA: Diagnosis not present

## 2016-03-16 DIAGNOSIS — N200 Calculus of kidney: Secondary | ICD-10-CM | POA: Diagnosis not present

## 2016-03-25 ENCOUNTER — Other Ambulatory Visit: Payer: Self-pay | Admitting: Internal Medicine

## 2016-03-26 ENCOUNTER — Ambulatory Visit: Payer: Self-pay | Admitting: Orthopedic Surgery

## 2016-03-26 ENCOUNTER — Other Ambulatory Visit: Payer: Self-pay | Admitting: Internal Medicine

## 2016-03-26 NOTE — H&P (Signed)
[name] [name] DOB: [birth year] Married / Language: English / Race: White Male Date of Admission:  04/22/2016 CC:  Left Hip Pain History of Present Illness  The patient is a 76 year old male who comes in for a preoperative History and Physical. The patient is scheduled for a left total hip arthroplasty (anterior) to be performed by Dr. Dione [name]. [name], MD at [facility] on 04-22-2016. The patient is a 76 year old male who was seen for left hip problems including pain symptoms that have been present for 3 month(s). The symptoms began without any known injury. Symptoms reported include hip pain (lt buttock pain and lt thigh pain , he does have some low back pain too.), tenderness and catching The patient reports symptoms radiating to the: left groin, left thigh and lower back. The patient describes the hip problem as throbbing. Onset of symptoms was gradual.The symptoms are described as moderate in severity.The patient feels as if their symptoms are notes that they are unchanged. Symptoms are exacerbated by walking and lying on the affected side. The patient is not currently being treated for this problem. Prior to being seen the patient was previously evaluated by a primary physician (then [facility]). Previous workup for this problem has included hip x-rays. Previous treatment for this problem has included corticosteroid injection (in the lt groin with relief for 3-4 weeks) It hurts in the anterior groin around the side and occasionally into the buttock. It is worse with activities and better with rest. It is felt that he would benefit from ondergoing a left total hip replacement. They have been treated conservatively in the past for the above stated problem and despite conservative measures, they continue to have progressive pain and severe functional limitations and dysfunction. They have failed non-operative management including home exercise, medications, and injections. It is felt that  they would benefit from undergoing total joint replacement. Risks and benefits of the procedure have been discussed with the patient and they elect to proceed with surgery. There are no active contraindications to surgery such as ongoing infection or rapidly progressive neurological disease.  Problem List/Past Medical Primary localized osteoarthritis of left hip (M16.12)  Chronic Pain  Gastroesophageal Reflux Disease  High blood pressure  Kidney Stone  Asthma  childhood Parathyroid Tumor   Allergies Iodinated Contrast  Short of breath  Family History Diabetes Mellitus  Sister.  Social History  Current drinker  12/13/2015: Currently drinks wine less than 5 times per week Exercise  Exercises daily; does running / walking No history of drug/alcohol rehab  Not under pain contract  Number of flights of stairs before winded  2-3 Tobacco / smoke exposure  12/13/2015: no Tobacco use  Never smoker. 12/13/2015 Advance Directives  Living Will, Healthcare POA  Medication History  Omeprazole (40MG  Capsule DR, Oral) Active. Tamsulosin HCl (0.4MG  Capsule, Oral) Active. Meloxicam (15MG  Tablet, Oral) Active. Bisoprolol-Hydrochlorothiazide (5-6.25MG  Tablet, Oral) Active. Fenofibrate (145MG  Tablet, Oral) Active. Losartan Potassium (100MG  Tablet, Oral) Active. Fish Oil + D3 (Oral) Specific strength unknown - Active. Vit D-Vit E-Safflower Oil (External) Active. Magnesium (Oral) Specific strength unknown - Active. Vit Calcium Citrate + D (Oral) Specific strength unknown - Active. Flax Seed Oil (Oral) Specific strength unknown - Active. ASA Arthritis Strength/Antacid (Oral) Specific strength unknown - Active. Vit B6-Vit B12-FA-Ginger (Transdermal) Specific strength unknown - Active.  Past Surgical History  Sinus Surgery  Esophageal Dilatation  Parathyroid Surgery  Multiple Urology Procedures for Kidney Stones   Review of Systems General Not  Present- Chills,  Fatigue, Fever, Memory Loss, Night Sweats, Weight Gain and Weight Loss. Skin Not Present- Eczema, Hives, Itching, Lesions and Rash. HEENT Not Present- Dentures, Double Vision, Headache, Hearing Loss, Tinnitus and Visual Loss. Respiratory Present- Cough. Not Present- Allergies, Chronic Cough, Coughing up blood, Shortness of breath at rest and Shortness of breath with exertion. Cardiovascular Not Present- Chest Pain, Difficulty Breathing Lying Down, Murmur, Palpitations, Racing/skipping heartbeats and Swelling. Gastrointestinal Not Present- Abdominal Pain, Bloody Stool, Constipation, Diarrhea, Difficulty Swallowing, Heartburn, Jaundice, Loss of appetitie, Nausea and Vomiting. Male Genitourinary Present- Urinating at Night. Not Present- Blood in Urine, Discharge, Flank Pain, Incontinence, Painful Urination, Urgency, Urinary frequency, Urinary Retention and Weak urinary stream. Musculoskeletal Present- Back Pain, Joint Pain, Morning Stiffness and Muscle Pain. Not Present- Joint Swelling, Muscle Weakness and Spasms. Neurological Not Present- Blackout spells, Difficulty with balance, Dizziness, Paralysis, Tremor and Weakness. Psychiatric Not Present- Insomnia.  Vitals Weight: 253 lb Height: 71in Body Surface Area: 2.33 m Body Mass Index: 35.29 kg/m  Pulse: 56 (Regular)  BP: 116/58 (Sitting, Left Arm, Standard)  Physical Exam  General Mental Status -Alert, cooperative and good historian. General Appearance-pleasant, Not in acute distress. Orientation-Oriented X3. Build & Nutrition-Well nourished and Well developed.  Head and Neck Head-normocephalic, atraumatic . Neck Global Assessment - supple, no bruit auscultated on the right, no bruit auscultated on the left.  Eye Vision-Wears corrective lenses. Pupil - Bilateral-Regular and Round. Motion - Bilateral-EOMI.  Chest and Lung Exam Auscultation Breath sounds - clear at anterior chest wall and clear at posterior  chest wall. Adventitious sounds - No Adventitious sounds.  Cardiovascular Auscultation Rhythm - Regular rate and rhythm. Heart Sounds - S1 WNL and S2 WNL. Murmurs & Other Heart Sounds - Auscultation of the heart reveals - No Murmurs.  Abdomen Palpation/Percussion Tenderness - Abdomen is non-tender to palpation. Rigidity (guarding) - Abdomen is soft. Auscultation Auscultation of the abdomen reveals - Bowel sounds normal.  Male Genitourinary Note: Not done, not pertinent to present illness  Musculoskeletal Note: External rotation of 20 and internal rotation of 3 with pain in the groin. Sensation and circulation are intact.  RADIOGRAPHS X-rays reveal advanced osteoarthritic change with near bone-on-bone findings in the left hip, only early moderate changes in the right hip.  Assessment & Plan Primary localized osteoarthritis of left hip (M16.12)  Note:Surgical Plans: Left Total Hip Replacement - Anterior Approach  Disposition: Home with Wife  PCP: Dr. Anitra Lauth - Patient was given a verbal okay prior to surgery.  IV TXA  Anesthesia Issues: None  Patient was instructed on what medications to stop prior to surgery.  Signed electronically by Ok Edwards, III PA-C

## 2016-04-09 ENCOUNTER — Encounter: Payer: Self-pay | Admitting: Physician Assistant

## 2016-04-09 ENCOUNTER — Ambulatory Visit (INDEPENDENT_AMBULATORY_CARE_PROVIDER_SITE_OTHER): Payer: Medicare Other | Admitting: Physician Assistant

## 2016-04-09 VITALS — BP 118/76 | HR 60 | Temp 97.7°F | Resp 16 | Ht 71.0 in | Wt 254.4 lb

## 2016-04-09 DIAGNOSIS — R7303 Prediabetes: Secondary | ICD-10-CM

## 2016-04-09 DIAGNOSIS — Z0001 Encounter for general adult medical examination with abnormal findings: Secondary | ICD-10-CM | POA: Diagnosis not present

## 2016-04-09 DIAGNOSIS — R6889 Other general symptoms and signs: Secondary | ICD-10-CM

## 2016-04-09 DIAGNOSIS — K222 Esophageal obstruction: Secondary | ICD-10-CM | POA: Diagnosis not present

## 2016-04-09 DIAGNOSIS — N183 Chronic kidney disease, stage 3 unspecified: Secondary | ICD-10-CM

## 2016-04-09 DIAGNOSIS — N201 Calculus of ureter: Secondary | ICD-10-CM | POA: Diagnosis not present

## 2016-04-09 DIAGNOSIS — I10 Essential (primary) hypertension: Secondary | ICD-10-CM | POA: Diagnosis not present

## 2016-04-09 DIAGNOSIS — E210 Primary hyperparathyroidism: Secondary | ICD-10-CM | POA: Diagnosis not present

## 2016-04-09 DIAGNOSIS — N200 Calculus of kidney: Secondary | ICD-10-CM

## 2016-04-09 DIAGNOSIS — E559 Vitamin D deficiency, unspecified: Secondary | ICD-10-CM

## 2016-04-09 DIAGNOSIS — J069 Acute upper respiratory infection, unspecified: Secondary | ICD-10-CM

## 2016-04-09 DIAGNOSIS — K219 Gastro-esophageal reflux disease without esophagitis: Secondary | ICD-10-CM | POA: Diagnosis not present

## 2016-04-09 DIAGNOSIS — Z0000 Encounter for general adult medical examination without abnormal findings: Secondary | ICD-10-CM

## 2016-04-09 DIAGNOSIS — E782 Mixed hyperlipidemia: Secondary | ICD-10-CM

## 2016-04-09 DIAGNOSIS — Z79899 Other long term (current) drug therapy: Secondary | ICD-10-CM

## 2016-04-09 LAB — CBC WITH DIFFERENTIAL/PLATELET
Basophils Absolute: 0 cells/uL (ref 0–200)
Basophils Relative: 0 %
Eosinophils Absolute: 120 cells/uL (ref 15–500)
Eosinophils Relative: 2 %
HCT: 43.3 % (ref 38.5–50.0)
Hemoglobin: 14.7 g/dL (ref 13.2–17.1)
Lymphocytes Relative: 33 %
Lymphs Abs: 1980 cells/uL (ref 850–3900)
MCH: 31.1 pg (ref 27.0–33.0)
MCHC: 33.9 g/dL (ref 32.0–36.0)
MCV: 91.5 fL (ref 80.0–100.0)
MPV: 10.1 fL (ref 7.5–12.5)
Monocytes Absolute: 600 cells/uL (ref 200–950)
Monocytes Relative: 10 %
Neutro Abs: 3300 cells/uL (ref 1500–7800)
Neutrophils Relative %: 55 %
Platelets: 264 10*3/uL (ref 140–400)
RBC: 4.73 MIL/uL (ref 4.20–5.80)
RDW: 12.7 % (ref 11.0–15.0)
WBC: 6 10*3/uL (ref 3.8–10.8)

## 2016-04-09 LAB — BASIC METABOLIC PANEL WITH GFR
BUN: 31 mg/dL — ABNORMAL HIGH (ref 7–25)
CO2: 20 mmol/L (ref 20–31)
Calcium: 9.8 mg/dL (ref 8.6–10.3)
Chloride: 104 mmol/L (ref 98–110)
Creat: 1.67 mg/dL — ABNORMAL HIGH (ref 0.70–1.18)
GFR, Est African American: 46 mL/min — ABNORMAL LOW (ref >=60)
GFR, Est Non African American: 40 mL/min — ABNORMAL LOW (ref >=60)
Glucose, Bld: 118 mg/dL — ABNORMAL HIGH (ref 65–99)
Potassium: 4.1 mmol/L (ref 3.5–5.3)
Sodium: 138 mmol/L (ref 135–146)

## 2016-04-09 LAB — HEPATIC FUNCTION PANEL
ALT: 17 U/L (ref 9–46)
AST: 22 U/L (ref 10–35)
Albumin: 4.2 g/dL (ref 3.6–5.1)
Alkaline Phosphatase: 27 U/L — ABNORMAL LOW (ref 40–115)
Bilirubin, Direct: 0.2 mg/dL (ref <=0.2)
Indirect Bilirubin: 0.8 mg/dL (ref 0.2–1.2)
Total Bilirubin: 1 mg/dL (ref 0.2–1.2)
Total Protein: 7.1 g/dL (ref 6.1–8.1)

## 2016-04-09 LAB — LIPID PANEL
Cholesterol: 171 mg/dL (ref <200)
HDL: 41 mg/dL (ref >40)
LDL Cholesterol: 99 mg/dL (ref <100)
Total CHOL/HDL Ratio: 4.2 Ratio (ref <5.0)
Triglycerides: 156 mg/dL — ABNORMAL HIGH (ref <150)
VLDL: 31 mg/dL — ABNORMAL HIGH (ref <30)

## 2016-04-09 LAB — MAGNESIUM: Magnesium: 1.7 mg/dL (ref 1.5–2.5)

## 2016-04-09 LAB — TSH: TSH: 1.36 mIU/L (ref 0.40–4.50)

## 2016-04-09 NOTE — Patient Instructions (Addendum)
You are on a fenofibrate but you can stop for 1 week to see if this helps If it does help we can try you on Zetia.   Get on allergy pill over the counter Use nasal spray that courtney gave you at night And use Symbicort inhaler 2 x a day, wash your mouth afterwards to avoid yeast  Can do a steroid nasal spary 1-2 sparys at night each nostril. Remember to spray each nostril twice towards the outer part of your eye.  Do not sniff but instead pinch your nose and tilt your head back to help the medicine get into your sinuses.  The best time to do this is at bedtime. Stop if you get blurred vision or nose bleeds.   Please pick one of the over the counter allergy medications below and take it once daily for allergies.  Claritin or loratadine cheapest but likely the weakest  Zyrtec or certizine at night because it can make you sleepy The strongest is allegra or fexafinadine  Cheapest at [facility], [employer], [employer]  Can continue the tessalon pills to quiet cough and can use sugar free cough syrup  HOW TO TREAT VIRAL COUGH AND COLD SYMPTOMS:  -Symptoms usually last at least 1 week with the worst symptoms being around day 4.  - colds usually start with a sore throat and end with a cough, and the cough can take 2-4 weeks to get better.  -No antibiotics are needed for colds, flu, sore throats, cough, bronchitis UNLESS symptoms are longer than 7 days OR if you are getting better then get drastically worse.  -There are a lot of combination medications (Dayquil, Nyquil, Vicks 44, tyelnol cold and sinus, ETC). Please look at the ingredients on the back so that you are treating the correct symptoms and not doubling up on medications/ingredients.    Medicines you can use  Nasal congestion  - pseudoephedrine (Sudafed)- behind the counter, do not use if you have high blood pressure, medicine that have -D in them.  - phenylephrine (Sudafed PE) -Dextormethorphan + chlorpheniramine (Coridcidin HBP)- okay if you  have high blood pressure -Oxymetazoline (Afrin) nasal spray- LIMIT to 3 days -Saline nasal spray -Neti pot (used distilled or bottled water)  Ear pain/congestion  -pseudoephedrine (sudafed) - Nasonex/flonase nasal spray  Fever  -Acetaminophen (Tyelnol) -Ibuprofen (Advil, motrin, aleve)  Sore Throat  -Acetaminophen (Tyelnol) -Ibuprofen (Advil, motrin, aleve) -Drink a lot of water -Gargle with salt water - Rest your voice (don't talk) -Throat sprays -Cough drops  Body Aches  -Acetaminophen (Tyelnol) -Ibuprofen (Advil, motrin, aleve)  Headache  -Acetaminophen (Tyelnol) -Ibuprofen (Advil, motrin, aleve) - Exedrin, Exedrin Migraine  Allergy symptoms (cough, sneeze, runny nose, itchy eyes) -Claritin or loratadine cheapest but likely the weakest  -Zyrtec or certizine at night because it can make you sleepy -The strongest is allegra or fexafinadine  Cheapest at [employer], [employer], [employer]  Cough  -Dextromethorphan (Delsym)- medicine that has DM in it -Guafenesin (Mucinex/Robitussin) - cough drops - drink lots of water  Chest Congestion  -Guafenesin (Mucinex/Robitussin)  Red Itchy Eyes  - Naphcon-A  Upset Stomach  - Bland diet (nothing spicy, greasy, fried, and high acid foods like tomatoes, oranges, berries) -OKAY- cereal, bread, soup, crackers, rice -Eat smaller more frequent meals -reduce caffeine, no alcohol -Loperamide (Imodium-AD) if diarrhea -Prevacid for heart burn  General health when sick  -Hydration -wash your hands frequently -keep surfaces clean -change pillow cases and sheets often -Get fresh air but do not exercise strenuously -Vitamin D, double  up on it - Vitamin C -Zinc      Simple math prevails.    1st - exercise does not produce significant weight loss - at best one converts fat into muscle , "bulks up", loses inches, but usually stays "weight neutral"     2nd - think of your body weightas a check book: If you eat more calories than you  burn up - you save money or gain weight .... Or if you spend more money than you put in the check book, ie burn up more calories than you eat, then you lose weight     3rd - if you walk or run 1 mile, you burn up 100 calories - you have to burn up 3,500 calories to lose 1 pound, ie you have to walk/run 35 miles to lose 1 measly pound. So if you want to lose 10 #, then you have to walk/run 350 miles, so.... clearly exercise is not the solution.     4. So if you consume 1,500 calories, then you have to burn up the equivalent of 15 miles to stay weight neutral - It also stands to reason that if you consume 1,500 cal/day and don't lose weight, then you must be burning up about 1,500 cals/day to stay weight neutral.     5. If you really want to lose weight, you must cut your calorie intake 300 calories /day and at that rate you should lose about 1 # every 3 days.   6. Please purchase Dr Fara Olden Fuhrman's book(s) "The End of Dieting" & "Eat to Live" . It has some great concepts and recipes.

## 2016-04-09 NOTE — Progress Notes (Signed)
MEDICARE ANNUAL WELLNESS VISIT AND FOLLOW UP Assessment:    Essential hypertension - continue medications, DASH diet, exercise and monitor at home. Call if greater than 130/80.  - CBC with Differential/Platelet - BASIC METABOLIC PANEL WITH GFR - TSH - Hepatic function panel   Morbid obesity, unspecified obesity type ([place]) Obesity with co morbidities- long discussion about weight loss, diet, and exercise HAS NOT HAD SLEEP STUDY YET BUT VERY LIKELY HAS OSA GET SLEEP STUDY  Hyperparathyroidism, primary ([place]) - BASIC METABOLIC PANEL WITH GFR  Prediabetes - Hemoglobin A1c  Hyperlipidemia -continue medications, check lipids, decrease fatty foods, increase activity.  - Lipid panel   Medicare annual wellness visit, subsequent  Medication management - Magnesium  Vitamin D deficiency - VITAMIN D 25 Hydroxy (Vit-D Deficiency, Fractures)   Kidney stones - BASIC METABOLIC PANEL WITH GFR   Stricture and stenosis of esophagus Continue PPI  Gastroesophageal reflux disease, esophagitis presence not specified Continue PPI  CKD (chronic kidney disease) stage 3, GFR 30-59 ml/min Increase fluids, avoid NSAIDS, monitor sugars, will monitor - BASIC METABOLIC PANEL WITH GFR  Left hip OA preop No chest pain, some residual cough but no signs of infection, get CXR, take inhalers.  Surgical Clearance: We will send letter to the surgeon for surgical clearance.   Recommendations:  DVT prophylaxis, monitoring of blood sugars and blood pressure post operatively.  Patient will schedule an appointment in the office post operatively for follow up.  Acute URI Allergy pill, Symbicort, tessalon, albuterol, get CXR Patient states has preop and will get CXR with ortho Wants to "stay away from sick people" Call or go to Er if any SOB, CP  Over 30 minutes of exam, counseling, chart review, and critical decision making was performed  Future Appointments Date Time Provider [place]   07/28/2016 9:00 AM Unk Pinto, MD [facility] None     Plan:   During the course of the visit the patient was educated and counseled about appropriate screening and preventive services including:    Pneumococcal vaccine   Influenza vaccine  Prevnar 13  Td vaccine  Screening electrocardiogram  Colorectal cancer screening  Diabetes screening  Glaucoma screening  Nutrition counseling    Subjective:  [name] [name] is a 76 y.o. male who presents for Medicare Annual Wellness Visit and 3 month follow up for HTN, hyperlipidemia, prediabetes, and vitamin D Def.   His blood pressure has been controlled at home, today their BP is BP: 118/76 He does not workout. He denies chest pain, shortness of breath, dizziness.  Has chronic GERD, on PPI, has had esophagus stretched 3 times.  Had cystoscopy for stone/stent placement in Oct by Dr. Diona Fanti, then he had another procedure to remove the stent in the office but they were unable to retrieve it per patient and goes this afternoon for removal.  He is suppose to have left THR with Dr. Wynelle Link, 02/20 but he had URI last Wednesday, he is feeling better but he continues to have cough but denies fever, chills, SOB, CP.  He is on cholesterol medication and has some myalgias with fenofibrate. His cholesterol is at goal. The cholesterol last visit was:   Lab Results  Component Value Date   CHOL 199 01/06/2016   HDL 52 01/06/2016   LDLCALC 108 (H) 01/06/2016   TRIG 196 (H) 01/06/2016   CHOLHDL 3.8 01/06/2016   He has been working on diet and exercise for prediabetes, and denies paresthesia of the feet, polydipsia, polyuria and visual disturbances. Last  A1C in the office was:  Lab Results  Component Value Date   HGBA1C 5.9 (H) 03/06/2016  Last GFR  Lab Results  Component Value Date   GFRNONAA 29 (L) 03/06/2016  Patient is on Vitamin D supplement.   Lab Results  Component Value Date   VD25OH 60 01/06/2016     BMI is Body mass  index is 35.48 kg/m., he is working on diet and exercise.  Wt Readings from Last 3 Encounters:  04/09/16 254 lb 6.4 oz (115.4 kg)  03/09/16 250 lb (113.4 kg)  03/06/16 250 lb (113.4 kg)     Medication Review: Current Outpatient Prescriptions on File Prior to Visit  Medication Sig Dispense Refill  . Ascorbic Acid (VITAMIN C) 1000 MG tablet Take 1,000 mg by mouth daily.    Marland Kitchen aspirin 81 MG tablet Take 81 mg by mouth daily.    . B Complex Vitamins (VITAMIN-B COMPLEX PO) Take 1 capsule by mouth every evening. Vegetable formula    . bisoprolol-hydrochlorothiazide (ZIAC) 5-6.25 MG tablet TAKE 1 TABLET BY MOUTH EVERY MORNING FOR BLOOD PRESSURE 90 tablet 4  . Cholecalciferol (VITAMIN D-3) 5000 units TABS Take 5,000 Units by mouth 2 (two) times daily.    . fenofibrate (TRICOR) 145 MG tablet TAKE 1 TABLET DAILY FOR TRIGLYCERIDES / BLOOD FATS 90 tablet 2  . Flaxseed, Linseed, (FLAXSEED OIL PO) Take 1,000 mg by mouth every morning.     Marland Kitchen ibuprofen (ADVIL,MOTRIN) 200 MG tablet Take 400 mg by mouth every 8 (eight) hours as needed (for pain.).    Marland Kitchen losartan (COZAAR) 100 MG tablet TAKE 1 TABLET BY MOUTH DAILY FOR BLOOD PRESSURE. 90 tablet 2  . magnesium oxide (MAG-OX) 400 MG tablet Take 400 mg by mouth daily.    . meloxicam (MOBIC) 15 MG tablet TAKE 1 TABLET BY MOUTH DAILY. 90 tablet 1  . Omega-3 Fatty Acids (OMEGA-3 FISH OIL) 1200 MG CAPS Take 1,200 mg by mouth 2 (two) times daily.    Marland Kitchen omeprazole (PRILOSEC) 40 MG capsule TAKE 1 CAPSULE BY MOUTH DAILY. (Patient taking differently: TAKE 1 CAPSULE (40 MG) BY MOUTH DAILY IN THE MORNING.) 90 capsule 1  . oxybutynin (DITROPAN) 5 MG tablet Take 1 tablet (5 mg total) by mouth every 8 (eight) hours as needed for bladder spasms. 30 tablet 1  . tamsulosin (FLOMAX) 0.4 MG CAPS capsule Take 0.4 mg by mouth daily after supper.   3   No current facility-administered medications on file prior to visit.     Current Problems (verified) Patient Active Problem List    Diagnosis Date Noted  . CKD (chronic kidney disease) stage 3, GFR 30-59 ml/min 03/25/2015  . Snoring 03/25/2015  . Medicare annual wellness visit, subsequent 11/27/2014  . Hyperparathyroidism, primary ([place]) 07/09/2014  . Vitamin D deficiency 06/13/2013  . Prediabetes 06/13/2013  . Medication management 06/13/2013  . Hyperlipidemia   . Hypertension   . GERD (gastroesophageal reflux disease)   . Kidney stones   . Morbidly obese (34.39)   . Stricture and stenosis of esophagus 10/18/2008    Screening Tests Immunization History  Administered Date(s) Administered  . Influenza Split 12/27/2012  . Influenza, High Dose Seasonal PF 01/31/2014, 11/27/2014  . Pneumococcal Conjugate-13 03/25/2015  . Pneumococcal Polysaccharide-23 03/02/2008  . Td 03/02/2005, 09/24/2015  . Zoster 03/02/2006   Preventative care: Last colonoscopy: 2008 DUE THIS YEAR EGD 2012 [facility] 2013  Prior vaccinations: TD or Tdap: 2017 Influenza: 2017  Pneumococcal: 2010 Prevnar13: 2017 Shingles/Zostavax: 2008  Names of  Other Physician/Practitioners you currently use: 1. [facility] here for primary care 2. Dr. Katy Fitch, eye doctor, last visit 11/2014 3. Dr. Zachery Dakins, dentist, last visit 01/2016 Patient Care Team: Unk Pinto, MD as PCP - General (Internal Medicine) Newt Minion, MD as Consulting Physician (Orthopedic Surgery) Inda Castle, MD as Consulting Physician (Gastroenterology) Franchot Gallo, MD as Consulting Physician (Urology)  Allergies Allergies  Allergen Reactions  . Iohexol Shortness Of Breath  . Ace Inhibitors     cough    SURGICAL HISTORY He  has a past surgical history that includes tumor ear (Left, age 83); Cystoscopy w/ ureteroscopy w/ lithotripsy (Left, 05/04/2005); Colonoscopy (02/17/2005); Nephrolithotomy (Left, 02/01/2014); Parathyroidectomy (Left, 07/10/2014); CYSTO/  LEFT RETROGRADE PYELOGRAM/ STENT PLACEMENT (01/26/2005);  Esophagogastroduodenoscopy (egd) with esophageal dilation (x4  last one 07-21-2010); Inguinal hernia repair (Left, yrs ago); Cystoscopy/retrograde/ureteroscopy/stone extraction with basket (Left, 12/30/2015); Cystoscopy with stent placement (Left, 12/30/2015); Holmium laser application (Left, [id number]); and Cystoscopy/ureteroscopy/holmium laser/stent placement (Left, 03/09/2016). FAMILY HISTORY His family history is not on file. SOCIAL HISTORY He  reports that he quit smoking about 42 years ago. He quit after 8.00 years of use. He has never used smokeless tobacco. He reports that he drinks alcohol. He reports that he does not use drugs.  MEDICARE WELLNESS OBJECTIVES: Physical activity: Current Exercise Habits: The patient does not participate in regular exercise at present, Exercise limited by: orthopedic condition(s) (recent hip replacement) Cardiac risk factors: Cardiac Risk Factors include: advanced age (>43men, >75 women);dyslipidemia;hypertension;male gender;sedentary lifestyle;obesity (BMI >30kg/m2) Depression/mood screen:   Depression screen [facility] 2/9 04/09/2016  Decreased Interest 0  Down, Depressed, Hopeless 0  PHQ - 2 Score 0    ADLs:  In your present state of health, do you have any difficulty performing the following activities: 04/09/2016 03/06/2016  Hearing? N N  Vision? N N  Difficulty concentrating or making decisions? N N  Walking or climbing stairs? N N  Dressing or bathing? N N  Doing errands, shopping? N N  Some recent data might be hidden     Cognitive Testing  Alert? Yes  Normal Appearance?Yes  Oriented to person? Yes  Place? Yes   Time? Yes  Recall of three objects?  Yes  Can perform simple calculations? Yes  Displays appropriate judgment?Yes  Can read the correct time from a watch face?Yes  EOL planning: Does Patient Have a Medical Advance Directive?: Yes Type of Advance Directive: Healthcare Power of Attorney, Living will Copy of [place] in  Chart?: No - copy requested   Objective:   Today's Vitals   04/09/16 0838  BP: 118/76  Pulse: 60  Resp: 16  Temp: 97.7 F (36.5 C)  SpO2: 98%  Weight: 254 lb 6.4 oz (115.4 kg)  Height: 5\' 11"  (1.803 m)  PainSc: 0-No pain   Body mass index is 35.48 kg/m.  General appearance: alert, no distress, WD/WN, male HEENT: normocephalic, sclerae anicteric, TMs pearly, nares patent, no discharge or erythema, pharynx normal Oral cavity: MMM, no lesions, crowded mouth Neck: supple, large neck circumference, no lymphadenopathy, no thyromegaly, no masses Heart: RRR, normal S1, S2, no murmurs Lungs: CTA bilaterally, no wheezes, rhonchi, or rales Abdomen: +bs, soft, obese, non tender, non distended, no masses, no hepatomegaly, no splenomegaly Musculoskeletal: nontender, no swelling, no obvious deformity Extremities: no edema, no cyanosis, no clubbing Pulses: 2+ symmetric, upper and lower extremities, normal cap refill Neurological: alert, oriented x 3, CN2-12 intact, strength normal upper extremities and lower extremities, sensation normal throughout,  DTRs 2+ throughout, no cerebellar signs, gait antalgic Psychiatric: normal affect, behavior normal, pleasant   Medicare Attestation I have personally reviewed: The patient's medical and social history Their use of alcohol, tobacco or illicit drugs Their current medications and supplements The patient's functional ability including ADLs,fall risks, home safety risks, cognitive, and hearing and visual impairment Diet and physical activities Evidence for depression or mood disorders  The patient's weight, height, BMI, and visual acuity have been recorded in the chart.  I have made referrals, counseling, and provided education to the patient based on review of the above and I have provided the patient with a written personalized care plan for preventive services.     Vicie Mutters, PA-C   04/09/2016

## 2016-04-10 ENCOUNTER — Ambulatory Visit: Payer: Self-pay | Admitting: Orthopedic Surgery

## 2016-04-10 LAB — HEMOGLOBIN A1C
Hgb A1c MFr Bld: 6 % — ABNORMAL HIGH (ref <5.7)
Mean Plasma Glucose: 126 mg/dL

## 2016-04-10 LAB — VITAMIN D 25 HYDROXY (VIT D DEFICIENCY, FRACTURES): Vit D, 25-Hydroxy: 89 ng/mL (ref 30–100)

## 2016-04-10 NOTE — Progress Notes (Signed)
Surgery on 2/21.  Preop on 2/14.  Need orders in EPIC.  Thank You.

## 2016-04-13 NOTE — Progress Notes (Signed)
Pt's wife was made aware of lab results & voiced understanding of those results.

## 2016-04-15 ENCOUNTER — Inpatient Hospital Stay (HOSPITAL_COMMUNITY)
Admission: RE | Admit: 2016-04-15 | Discharge: 2016-04-15 | Disposition: A | Discharge status: Home / Self Care | Payer: Medicare Other | Source: Ambulatory Visit

## 2016-04-22 ENCOUNTER — Encounter (HOSPITAL_COMMUNITY): Admission: RE | Payer: Self-pay | Source: Ambulatory Visit

## 2016-04-22 ENCOUNTER — Inpatient Hospital Stay (HOSPITAL_COMMUNITY): Admission: RE | Admit: 2016-04-22 | Payer: Medicare Other | Source: Ambulatory Visit | Admitting: Orthopedic Surgery

## 2016-04-22 SURGERY — ARTHROPLASTY, HIP, TOTAL, ANTERIOR APPROACH
Anesthesia: Choice | Site: Hip | Laterality: Left

## 2016-04-23 ENCOUNTER — Ambulatory Visit: Payer: Self-pay | Admitting: Orthopedic Surgery

## 2016-04-28 ENCOUNTER — Other Ambulatory Visit: Payer: Self-pay | Admitting: Internal Medicine

## 2016-04-30 DIAGNOSIS — M1612 Unilateral primary osteoarthritis, left hip: Secondary | ICD-10-CM | POA: Diagnosis not present

## 2016-05-28 ENCOUNTER — Ambulatory Visit: Payer: Self-pay | Admitting: Orthopedic Surgery

## 2016-05-28 DIAGNOSIS — N1330 Unspecified hydronephrosis: Secondary | ICD-10-CM | POA: Diagnosis not present

## 2016-05-28 DIAGNOSIS — N200 Calculus of kidney: Secondary | ICD-10-CM | POA: Diagnosis not present

## 2016-05-28 NOTE — H&P (Signed)
[name] [name] DOB: [birth year] Married / Language: English / Race: White Male Date of Admission:  06/24/2016 CC:  Left hip pain History of Present Illness The patient is a 76 year old male who comes in for a preoperative History and Physical. The patient is scheduled for a left total hip arthroplasty (anterior) to be performed by Dr. Dione Plover. [name], MD at [facility] on 06-24-2016. The patient is a 76 year old male who was seen for left hip problems including pain symptoms that have been present for many month(s). The symptoms began without any known injury. Symptoms reported include hip pain (lt buttock pain and lt thigh pain , he does have some low back pain too.), tenderness and catching The patient reports symptoms radiating to the: left groin, left thigh and lower back. The patient describes the hip problem as throbbing. Onset of symptoms was gradual.The symptoms are described as moderate in severity.The patient feels as if their symptoms are notes that they are unchanged. Symptoms are exacerbated by walking and lying on the affected side. The patient is not currently being treated for this problem. Prior to being seen the patient was previously evaluated by a primary physician (then [facility]). Previous workup for this problem has included hip x-rays. Previous treatment for this problem has included corticosteroid injection (in the lt groin with relief for 3-4 weeks). It hurts in the anterior groin around the side and occasionally into the buttock. It is worse with activities and better with rest. It is felt that he would benefit from ondergoing a left total hip replacement. He was originally scheduled for hip replacement back in February of this year but contracted the flu. He has since recovered and is now ready to proceed with surgery at this time. They have been treated conservatively in the past for the above stated problem and despite conservative measures, they continue to have  progressive pain and severe functional limitations and dysfunction. They have failed non-operative management including home exercise, medications, and injections. It is felt that they would benefit from undergoing total joint replacement. Risks and benefits of the procedure have been discussed with the patient and they elect to proceed with surgery. There are no active contraindications to surgery such as ongoing infection or rapidly progressive neurological disease.  Problem List/Past Medical Primary localized osteoarthritis of left hip (M16.12)  Chronic Pain  Gastroesophageal Reflux Disease  High blood pressure  Kidney Stone  Asthma  childhood Parathyroid Tumor   Allergies  No Known Drug Allergies  Iodinated Contrast  Short of breath He IS able to use topical Betadine  Family History Diabetes Mellitus  Sister.  Social History  Marital status  Married. Children  2 Current occupation  Rental Property Current drinker  12/13/2015: Currently drinks wine less than 5 times per week Exercise  Exercises daily; does running / walking No history of drug/alcohol rehab  Not under pain contract  Number of flights of stairs before winded  2-3 Tobacco / smoke exposure  12/13/2015: no Tobacco use  Never smoker. 12/13/2015 Advance Directives  Living Will, Healthcare POA  Medication History Omeprazole (40MG  Capsule DR, Oral) Active. Tamsulosin HCl (0.4MG  Capsule, Oral) Active. Meloxicam (15MG  Tablet, Oral) Active. Bisoprolol-Hydrochlorothiazide (5-6.25MG  Tablet, Oral) Active. Fenofibrate (145MG  Tablet, Oral) Active. Losartan Potassium (100MG  Tablet, Oral) Active. Fish Oil + D3 (Oral) Specific strength unknown - Active. Vit D-Vit E-Safflower Oil (External) Active. Magnesium (Oral) Specific strength unknown - Active. Vit Calcium Citrate + D (Oral) Specific strength unknown -  Active. Flax Seed Oil (Oral) Specific strength unknown - Active. ASA Arthritis  Strength/Antacid (Oral) Specific strength unknown - Active. Vit B6-Vit B12-FA-Ginger (Transdermal) Specific strength unknown - Active.  Past Surgical History  Sinus Surgery  Esophageal Dilatation  Parathyroid Surgery  Multiple Urology Procedures for Kidney Stones    Review of Systems General Not Present- Chills, Fatigue, Fever, Memory Loss, Night Sweats, Weight Gain and Weight Loss. Skin Not Present- Eczema, Hives, Itching, Lesions and Rash. HEENT Not Present- Dentures, Double Vision, Headache, Hearing Loss, Tinnitus and Visual Loss. Respiratory Present- Cough. Not Present- Allergies, Chronic Cough, Coughing up blood, Shortness of breath at rest and Shortness of breath with exertion. Cardiovascular Not Present- Chest Pain, Difficulty Breathing Lying Down, Murmur, Palpitations, Racing/skipping heartbeats and Swelling. Gastrointestinal Not Present- Abdominal Pain, Bloody Stool, Constipation, Diarrhea, Difficulty Swallowing, Heartburn, Jaundice, Loss of appetitie, Nausea and Vomiting. Male Genitourinary Not Present- Blood in Urine, Discharge, Flank Pain, Incontinence, Painful Urination, Urgency, Urinary frequency, Urinary Retention, Urinating at Night and Weak urinary stream. Musculoskeletal Present- Back Pain and Morning Stiffness. Not Present- Joint Pain, Joint Swelling, Muscle Pain, Muscle Weakness and Spasms. Neurological Not Present- Blackout spells, Difficulty with balance, Dizziness, Paralysis, Tremor and Weakness. Psychiatric Not Present- Insomnia.  Vitals Weight: 250 lb Height: 71in Weight was reported by patient. Height was reported by patient. Body Surface Area: 2.32 m Body Mass Index: 34.87 kg/m  Pulse: 62 (Regular)  BP: 128/72 (Sitting, Right Arm, Standard)   Physical Exam General Mental Status -Alert, cooperative and good historian. General Appearance-pleasant, Not in acute distress. Orientation-Oriented X3. Build & Nutrition-Well nourished and  Well developed.  Head and Neck Head-normocephalic, atraumatic . Neck Global Assessment - supple, no bruit auscultated on the right, no bruit auscultated on the left.  Eye Vision-Wears corrective lenses. Pupil - Bilateral-Regular and Round. Motion - Bilateral-EOMI.  Chest and Lung Exam Auscultation Breath sounds - clear at anterior chest wall and clear at posterior chest wall. Adventitious sounds - No Adventitious sounds.  Cardiovascular Auscultation Rhythm - Regular rate and rhythm. Heart Sounds - S1 WNL and S2 WNL. Murmurs & Other Heart Sounds - Auscultation of the heart reveals - No Murmurs.  Abdomen Inspection Contour - Generalized mild distention. Palpation/Percussion Tenderness - Abdomen is non-tender to palpation. Rigidity (guarding) - Abdomen is soft. Auscultation Auscultation of the abdomen reveals - Bowel sounds normal.  Male Genitourinary Note: Not done, not pertinent to present illness   Musculoskeletal Note: External rotation of 20 and internal rotation of 30 with pain in the groin. Sensation and circulation are intact. Motor intact.  RADIOGRAPHS X-rays reveal advanced osteoarthritic change with near bone-on-bone findings in the left hip, only early moderate changes in the right hip.  Assessment & Plan  Primary localized osteoarthritis of left hip (M16.12)  Note:Surgical Plans: Left Total Hip Replacement - Anterior Approach  Disposition: Home  PCP: Dr. Unk Pinto - patient has been seen in the office and given a verbal okay to proceed.  IV TXA  Anesthesia Issues: None  Patient was instructed on what medications to stop prior to surgery.  Signed electronically by Joelene Millin, III PA-C

## 2016-05-28 NOTE — H&P (Signed)
Beverly Milch DOB: Feb 03, 1941 Married / Language: English / Race: White Male Date of Admission:  06/24/2016 CC:  Left hip pain History of Present Illness The patient is a 76 year old male who comes in for a preoperative History and Physical. The patient is scheduled for a left total hip arthroplasty (anterior) to be performed by Dr. Dione Plover. [name], MD at [facility] on 06-24-2016. The patient is a 76 year old male who was seen for left hip problems including pain symptoms that have been present for many month(s). The symptoms began without any known injury. Symptoms reported include hip pain (lt buttock pain and lt thigh pain , he does have some low back pain too.), tenderness and catching The patient reports symptoms radiating to the: left groin, left thigh and lower back. The patient describes the hip problem as throbbing. Onset of symptoms was gradual.The symptoms are described as moderate in severity.The patient feels as if their symptoms are notes that they are unchanged. Symptoms are exacerbated by walking and lying on the affected side. The patient is not currently being treated for this problem. Prior to being seen the patient was previously evaluated by a primary physician (then [facility]). Previous workup for this problem has included hip x-rays. Previous treatment for this problem has included corticosteroid injection (in the lt groin with relief for 3-4 weeks). It hurts in the anterior groin around the side and occasionally into the buttock. It is worse with activities and better with rest. It is felt that he would benefit from ondergoing a left total hip replacement. He was originally scheduled for hip replacement back in February of this year but contracted the flu. He has since recovered and is now ready to proceed with surgery at this time. They have been treated conservatively in the past for the above stated problem and despite conservative measures, they continue to have  progressive pain and severe functional limitations and dysfunction. They have failed non-operative management including home exercise, medications, and injections. It is felt that they would benefit from undergoing total joint replacement. Risks and benefits of the procedure have been discussed with the patient and they elect to proceed with surgery. There are no active contraindications to surgery such as ongoing infection or rapidly progressive neurological disease.  Problem List/Past Medical Primary localized osteoarthritis of left hip (M16.12)  Chronic Pain  Gastroesophageal Reflux Disease  High blood pressure  Kidney Stone  Asthma  childhood Parathyroid Tumor   Allergies  No Known Drug Allergies  Iodinated Contrast  Short of breath He IS able to use topical Betadine  Family History Diabetes Mellitus  Sister.  Social History  Marital status  Married. Children  2 Current occupation  Rental Property Current drinker  12/13/2015: Currently drinks wine less than 5 times per week Exercise  Exercises daily; does running / walking No history of drug/alcohol rehab  Not under pain contract  Number of flights of stairs before winded  2-3 Tobacco / smoke exposure  12/13/2015: no Tobacco use  Never smoker. 12/13/2015 Advance Directives  Living Will, Healthcare POA  Medication History Omeprazole (40MG  Capsule DR, Oral) Active. Tamsulosin HCl (0.4MG  Capsule, Oral) Active. Meloxicam (15MG  Tablet, Oral) Active. Bisoprolol-Hydrochlorothiazide (5-6.25MG  Tablet, Oral) Active. Fenofibrate (145MG  Tablet, Oral) Active. Losartan Potassium (100MG  Tablet, Oral) Active. Fish Oil + D3 (Oral) Specific strength unknown - Active. Vit D-Vit E-Safflower Oil (External) Active. Magnesium (Oral) Specific strength unknown - Active. Vit Calcium Citrate + D (Oral) Specific strength unknown -  Active. Flax Seed Oil (Oral) Specific strength unknown - Active. ASA Arthritis  Strength/Antacid (Oral) Specific strength unknown - Active. Vit B6-Vit B12-FA-Ginger (Transdermal) Specific strength unknown - Active.  Past Surgical History  Sinus Surgery  Esophageal Dilatation  Parathyroid Surgery  Multiple Urology Procedures for Kidney Stones    Review of Systems General Not Present- Chills, Fatigue, Fever, Memory Loss, Night Sweats, Weight Gain and Weight Loss. Skin Not Present- Eczema, Hives, Itching, Lesions and Rash. HEENT Not Present- Dentures, Double Vision, Headache, Hearing Loss, Tinnitus and Visual Loss. Respiratory Present- Cough. Not Present- Allergies, Chronic Cough, Coughing up blood, Shortness of breath at rest and Shortness of breath with exertion. Cardiovascular Not Present- Chest Pain, Difficulty Breathing Lying Down, Murmur, Palpitations, Racing/skipping heartbeats and Swelling. Gastrointestinal Not Present- Abdominal Pain, Bloody Stool, Constipation, Diarrhea, Difficulty Swallowing, Heartburn, Jaundice, Loss of appetitie, Nausea and Vomiting. Male Genitourinary Not Present- Blood in Urine, Discharge, Flank Pain, Incontinence, Painful Urination, Urgency, Urinary frequency, Urinary Retention, Urinating at Night and Weak urinary stream. Musculoskeletal Present- Back Pain and Morning Stiffness. Not Present- Joint Pain, Joint Swelling, Muscle Pain, Muscle Weakness and Spasms. Neurological Not Present- Blackout spells, Difficulty with balance, Dizziness, Paralysis, Tremor and Weakness. Psychiatric Not Present- Insomnia.  Vitals Weight: 250 lb Height: 71in Weight was reported by patient. Height was reported by patient. Body Surface Area: 2.32 m Body Mass Index: 34.87 kg/m  Pulse: 62 (Regular)  BP: 128/72 (Sitting, Right Arm, Standard)   Physical Exam General Mental Status -Alert, cooperative and good historian. General Appearance-pleasant, Not in acute distress. Orientation-Oriented X3. Build & Nutrition-Well  nourished and Well developed.  Head and Neck Head-normocephalic, atraumatic . Neck Global Assessment - supple, no bruit auscultated on the right, no bruit auscultated on the left.  Eye Vision-Wears corrective lenses. Pupil - Bilateral-Regular and Round. Motion - Bilateral-EOMI.  Chest and Lung Exam Auscultation Breath sounds - clear at anterior chest wall and clear at posterior chest wall. Adventitious sounds - No Adventitious sounds.  Cardiovascular Auscultation Rhythm - Regular rate and rhythm. Heart Sounds - S1 WNL and S2 WNL. Murmurs & Other Heart Sounds - Auscultation of the heart reveals - No Murmurs.  Abdomen Inspection Contour - Generalized mild distention. Palpation/Percussion Tenderness - Abdomen is non-tender to palpation. Rigidity (guarding) - Abdomen is soft. Auscultation Auscultation of the abdomen reveals - Bowel sounds normal.  Male Genitourinary Note: Not done, not pertinent to present illness   Musculoskeletal Note: External rotation of 20 and internal rotation of 30 with pain in the groin. Sensation and circulation are intact. Motor intact.  RADIOGRAPHS X-rays reveal advanced osteoarthritic change with near bone-on-bone findings in the left hip, only early moderate changes in the right hip.  Assessment & Plan  Primary localized osteoarthritis of left hip (M16.12)  Note:Surgical Plans: Left Total Hip Replacement - Anterior Approach  Disposition: Home  PCP: Dr. Unk Pinto - patient has been seen in the office and given a verbal okay to proceed.  IV TXA  Anesthesia Issues: None  Patient was instructed on what medications to stop prior to surgery.  Signed electronically by Joelene Millin, III PA-C

## 2016-06-01 ENCOUNTER — Other Ambulatory Visit: Payer: Self-pay | Admitting: Internal Medicine

## 2016-06-01 ENCOUNTER — Other Ambulatory Visit (HOSPITAL_COMMUNITY): Payer: Self-pay | Admitting: Emergency Medicine

## 2016-06-01 NOTE — Patient Instructions (Addendum)
[name] [name]  06/01/2016   Your procedure is scheduled on: 06-24-16  Report to [facility]  Entrance take [facility]  elevators to 3rd floor to  [place] at 3:30PM.  Call this number if you have problems the morning of surgery [phone]   Remember: ONLY 1 PERSON MAY GO WITH YOU TO SHORT STAY TO GET  READY MORNING OF [place].  Do not eat food After Midnight. YOU MAY HAVE CLEAR LIQUIDS FROM MIDNIGHT UNTIL 9:30AM DAY OF SURGERY. NOTHING BY MOUTH AFTER 9:30AM!!     Take these medicines the morning of surgery with A SIP OF WATER: FENOFIBRATE(TRICOR), OMEPRAZOLE(PRILOSEC), TAMSULOSIN(FLOMAX)                                You may not have any metal on your body including hair pins and              piercings  Do not wear jewelry, lotions, powders or perfumes, deodorant             Men may shave face and neck.   Do not bring valuables to the hospital. [place].  Contacts, dentures or bridgework may not be worn into surgery.  Leave suitcase in the car. After surgery it may be brought to your room.               Please read over the following fact sheets you were given: _____________________________________________________________________                CLEAR LIQUID DIET   Foods Allowed                                                                     Foods Excluded  Coffee and tea, regular and decaf                             liquids that you cannot  Plain Jell-O in any flavor                                             see through such as: Fruit ices (not with fruit pulp)                                     milk, soups, orange juice  Iced Popsicles                                    All solid food Carbonated beverages, regular and diet  Cranberry, grape and apple juices Sports drinks like Gatorade Lightly seasoned clear broth or consume(fat free) Sugar,  honey syrup  Sample Menu Breakfast                                Lunch                                     Supper Cranberry juice                    Beef broth                            Chicken broth Jell-O                                     Grape juice                           Apple juice Coffee or tea                        Jell-O                                      Popsicle                                                Coffee or tea                        Coffee or tea  _____________________________________________________________________  [facility] - Preparing for Surgery Before surgery, you can play an important role.  Because skin is not sterile, your skin needs to be as free of germs as possible.  You can reduce the number of germs on your skin by washing with CHG (chlorahexidine gluconate) soap before surgery.  CHG is an antiseptic cleaner which kills germs and bonds with the skin to continue killing germs even after washing. Please DO NOT use if you have an allergy to CHG or antibacterial soaps.  If your skin becomes reddened/irritated stop using the CHG and inform your nurse when you arrive at Short Stay. Do not shave (including legs and underarms) for at least 48 hours prior to the first CHG shower.  You may shave your face/neck. Please follow these instructions carefully:  1.  Shower with CHG Soap the night before surgery and the  morning of Surgery.  2.  If you choose to wash your hair, wash your hair first as usual with your  normal  shampoo.  3.  After you shampoo, rinse your hair and body thoroughly to remove the  shampoo.                           4.  Use CHG as you would any other liquid soap.  You can apply chg directly  to the skin and wash  Gently with a scrungie or clean washcloth.  5.  Apply the CHG Soap to your body ONLY FROM THE NECK DOWN.   Do not use on face/ open                           Wound or open sores. Avoid contact with eyes, ears  mouth and genitals (private parts).                       Wash face,  Genitals (private parts) with your normal soap.             6.  Wash thoroughly, paying special attention to the area where your surgery  will be performed.  7.  Thoroughly rinse your body with warm water from the neck down.  8.  DO NOT shower/wash with your normal soap after using and rinsing off  the CHG Soap.                9.  Pat yourself dry with a clean towel.            10.  Wear clean pajamas.            11.  Place clean sheets on your bed the night of your first shower and do not  sleep with pets. Day of Surgery : Do not apply any lotions/deodorants the morning of surgery.  Please wear clean clothes to the hospital/surgery center.  FAILURE TO FOLLOW THESE INSTRUCTIONS MAY RESULT IN THE CANCELLATION OF YOUR SURGERY PATIENT SIGNATURE_________________________________  NURSE SIGNATURE__________________________________  ________________________________________________________________________   [name] [name]  An incentive spirometer is a tool that can help keep your lungs clear and active. This tool measures how well you are filling your lungs with each breath. Taking long deep breaths may help reverse or decrease the chance of developing breathing (pulmonary) problems (especially infection) following:  A long period of time when you are unable to move or be active. BEFORE THE PROCEDURE   If the spirometer includes an indicator to show your best effort, your nurse or respiratory therapist will set it to a desired goal.  If possible, sit up straight or lean slightly forward. Try not to slouch.  Hold the incentive spirometer in an upright position. INSTRUCTIONS FOR USE  1. Sit on the edge of your bed if possible, or sit up as far as you can in bed or on a chair. 2. Hold the incentive spirometer in an upright position. 3. Breathe out normally. 4. Place the mouthpiece in your mouth and seal your lips  tightly around it. 5. Breathe in slowly and as deeply as possible, raising the piston or the ball toward the top of the column. 6. Hold your breath for 3-5 seconds or for as long as possible. Allow the piston or ball to fall to the bottom of the column. 7. Remove the mouthpiece from your mouth and breathe out normally. 8. Rest for a few seconds and repeat Steps 1 through 7 at least 10 times every 1-2 hours when you are awake. Take your time and take a few normal breaths between deep breaths. 9. The spirometer may include an indicator to show your best effort. Use the indicator as a goal to work toward during each repetition. 10. After each set of 10 deep breaths, practice coughing to be sure your lungs are clear. If you have an incision (the cut made at the time of  surgery), support your incision when coughing by placing a pillow or rolled up towels firmly against it. Once you are able to get out of bed, walk around indoors and cough well. You may stop using the incentive spirometer when instructed by your caregiver.  RISKS AND COMPLICATIONS  Take your time so you do not get dizzy or light-headed.  If you are in pain, you may need to take or ask for pain medication before doing incentive spirometry. It is harder to take a deep breath if you are having pain. AFTER USE  Rest and breathe slowly and easily.  It can be helpful to keep track of a log of your progress. Your caregiver can provide you with a simple table to help with this. If you are using the spirometer at home, follow these instructions: [place] IF:   You are having difficultly using the spirometer.  You have trouble using the spirometer as often as instructed.  Your pain medication is not giving enough relief while using the spirometer.  You develop fever of 100.5 F (38.1 C) or higher. SEEK IMMEDIATE MEDICAL CARE IF:   You cough up bloody sputum that had not been present before.  You develop fever of 102 F  (38.9 C) or greater.  You develop worsening pain at or near the incision site. MAKE SURE YOU:   Understand these instructions.  Will watch your condition.  Will get help right away if you are not doing well or get worse. Document Released: 06/29/2006 Document Revised: 05/11/2011 Document Reviewed: 08/30/2006 Exit[employer] Patient Information 2014 [employer], [place].   ________________________________________________________________________  WHAT IS A BLOOD TRANSFUSION? Blood Transfusion Information  A transfusion is the replacement of blood or some of its parts. Blood is made up of multiple cells which provide different functions.  Red blood cells carry oxygen and are used for blood loss replacement.  White blood cells fight against infection.  Platelets control bleeding.  Plasma helps clot blood.  Other blood products are available for specialized needs, such as hemophilia or other clotting disorders. BEFORE THE TRANSFUSION  Who gives blood for transfusions?   Healthy volunteers who are fully evaluated to make sure their blood is safe. This is blood bank blood. Transfusion therapy is the safest it has ever been in the practice of medicine. Before blood is taken from a donor, a complete history is taken to make sure that person has no history of diseases nor engages in risky social behavior (examples are intravenous drug use or sexual activity with multiple partners). The donor's travel history is screened to minimize risk of transmitting infections, such as malaria. The donated blood is tested for signs of infectious diseases, such as HIV and hepatitis. The blood is then tested to be sure it is compatible with you in order to minimize the chance of a transfusion reaction. If you or a relative donates blood, this is often done in anticipation of surgery and is not appropriate for emergency situations. It takes many days to process the donated blood. RISKS AND COMPLICATIONS Although  transfusion therapy is very safe and saves many lives, the main dangers of transfusion include:   Getting an infectious disease.  Developing a transfusion reaction. This is an allergic reaction to something in the blood you were given. Every precaution is taken to prevent this. The decision to have a blood transfusion has been considered carefully by your caregiver before blood is given. Blood is not given unless the benefits outweigh the risks. AFTER THE  TRANSFUSION  Right after receiving a blood transfusion, you will usually feel much better and more energetic. This is especially true if your red blood cells have gotten low (anemic). The transfusion raises the level of the red blood cells which carry oxygen, and this usually causes an energy increase.  The nurse administering the transfusion will monitor you carefully for complications. HOME CARE INSTRUCTIONS  No special instructions are needed after a transfusion. You may find your energy is better. Speak with your caregiver about any limitations on activity for underlying diseases you may have. SEEK MEDICAL CARE IF:   Your condition is not improving after your transfusion.  You develop redness or irritation at the intravenous (IV) site. SEEK IMMEDIATE MEDICAL CARE IF:  Any of the following symptoms occur over the next 12 hours:  Shaking chills.  You have a temperature by mouth above 102 F (38.9 C), not controlled by medicine.  Chest, back, or muscle pain.  People around you feel you are not acting correctly or are confused.  Shortness of breath or difficulty breathing.  Dizziness and fainting.  You get a rash or develop hives.  You have a decrease in urine output.  Your urine turns a dark color or changes to pink, red, or brown. Any of the following symptoms occur over the next 10 days:  You have a temperature by mouth above 102 F (38.9 C), not controlled by medicine.  Shortness of breath.  Weakness after normal  activity.  The white part of the eye turns yellow (jaundice).  You have a decrease in the amount of urine or are urinating less often.  Your urine turns a dark color or changes to pink, red, or brown. Document Released: 02/14/2000 Document Revised: 05/11/2011 Document Reviewed: 10/03/2007 [facility] Patient Information 2014 [place], [place].  _______________________________________________________________________

## 2016-06-01 NOTE — Progress Notes (Signed)
LOV Dr Melford Aase 01-06-16 epic     EKG 06-27-15  Epic

## 2016-06-02 ENCOUNTER — Encounter (HOSPITAL_COMMUNITY): Payer: Self-pay

## 2016-06-02 ENCOUNTER — Encounter (HOSPITAL_COMMUNITY)
Admission: RE | Admit: 2016-06-02 | Discharge: 2016-06-02 | Disposition: A | Discharge status: Home / Self Care | Payer: Medicare Other | Source: Ambulatory Visit | Attending: Orthopedic Surgery | Admitting: Orthopedic Surgery

## 2016-06-02 DIAGNOSIS — M1612 Unilateral primary osteoarthritis, left hip: Secondary | ICD-10-CM | POA: Insufficient documentation

## 2016-06-02 DIAGNOSIS — Z01812 Encounter for preprocedural laboratory examination: Secondary | ICD-10-CM | POA: Insufficient documentation

## 2016-06-02 LAB — COMPREHENSIVE METABOLIC PANEL
ALT: 20 U/L (ref 17–63)
AST: 31 U/L (ref 15–41)
Albumin: 4.2 g/dL (ref 3.5–5.0)
Alkaline Phosphatase: 28 U/L — ABNORMAL LOW (ref 38–126)
Anion gap: 8 (ref 5–15)
BUN: 24 mg/dL — ABNORMAL HIGH (ref 6–20)
CO2: 27 mmol/L (ref 22–32)
Calcium: 9.7 mg/dL (ref 8.9–10.3)
Chloride: 103 mmol/L (ref 101–111)
Creatinine, Ser: 1.64 mg/dL — ABNORMAL HIGH (ref 0.61–1.24)
GFR calc Af Amer: 46 mL/min — ABNORMAL LOW (ref >60)
GFR calc non Af Amer: 40 mL/min — ABNORMAL LOW (ref >60)
Glucose, Bld: 108 mg/dL — ABNORMAL HIGH (ref 65–99)
Potassium: 4.3 mmol/L (ref 3.5–5.1)
Sodium: 138 mmol/L (ref 135–145)
Total Bilirubin: 1 mg/dL (ref 0.3–1.2)
Total Protein: 7.1 g/dL (ref 6.5–8.1)

## 2016-06-02 LAB — CBC
HCT: 42 % (ref 39.0–52.0)
Hemoglobin: 14.5 g/dL (ref 13.0–17.0)
MCH: 30.9 pg (ref 26.0–34.0)
MCHC: 34.5 g/dL (ref 30.0–36.0)
MCV: 89.6 fL (ref 78.0–100.0)
Platelets: 266 10*3/uL (ref 150–400)
RBC: 4.69 MIL/uL (ref 4.22–5.81)
RDW: 12.8 % (ref 11.5–15.5)
WBC: 7.1 10*3/uL (ref 4.0–10.5)

## 2016-06-02 LAB — APTT: aPTT: 26 seconds (ref 24–36)

## 2016-06-02 LAB — SURGICAL PCR SCREEN
MRSA, PCR: NEGATIVE
Staphylococcus aureus: NEGATIVE

## 2016-06-02 LAB — PROTIME-INR
INR: 1.04
Prothrombin Time: 13.6 seconds (ref 11.4–15.2)

## 2016-06-02 NOTE — Progress Notes (Signed)
cmp results routed via epic to Dr Wynelle Link

## 2016-06-02 NOTE — Progress Notes (Signed)
MEDICAL CLEARANCE DR [name] 04-09-16 ON CHART

## 2016-06-03 LAB — HEMOGLOBIN A1C
Hgb A1c MFr Bld: 5.9 % — ABNORMAL HIGH (ref 4.8–5.6)
Mean Plasma Glucose: 123 mg/dL

## 2016-06-08 ENCOUNTER — Other Ambulatory Visit: Payer: Self-pay | Admitting: Orthopedic Surgery

## 2016-06-12 ENCOUNTER — Other Ambulatory Visit: Payer: Self-pay | Admitting: Orthopedic Surgery

## 2016-06-24 ENCOUNTER — Inpatient Hospital Stay (HOSPITAL_COMMUNITY): Payer: Medicare Other | Admitting: Certified Registered Nurse Anesthetist

## 2016-06-24 ENCOUNTER — Inpatient Hospital Stay (HOSPITAL_COMMUNITY): Payer: Medicare Other

## 2016-06-24 ENCOUNTER — Encounter (HOSPITAL_COMMUNITY)
Admission: RE | Disposition: A | Discharge status: Home / Self Care | Payer: Self-pay | Source: Ambulatory Visit | Attending: Orthopedic Surgery

## 2016-06-24 ENCOUNTER — Inpatient Hospital Stay (HOSPITAL_COMMUNITY)
Admission: RE | Admit: 2016-06-24 | Discharge: 2016-06-26 | DRG: 470 | Disposition: A | Discharge status: Home / Self Care | Payer: Medicare Other | Source: Ambulatory Visit | Attending: Orthopedic Surgery | Admitting: Orthopedic Surgery

## 2016-06-24 ENCOUNTER — Encounter (HOSPITAL_COMMUNITY): Payer: Self-pay | Admitting: Certified Registered Nurse Anesthetist

## 2016-06-24 DIAGNOSIS — Z96649 Presence of unspecified artificial hip joint: Secondary | ICD-10-CM

## 2016-06-24 DIAGNOSIS — Z86010 Personal history of colonic polyps: Secondary | ICD-10-CM

## 2016-06-24 DIAGNOSIS — N401 Enlarged prostate with lower urinary tract symptoms: Secondary | ICD-10-CM | POA: Diagnosis present

## 2016-06-24 DIAGNOSIS — R0683 Snoring: Secondary | ICD-10-CM | POA: Diagnosis not present

## 2016-06-24 DIAGNOSIS — Z79899 Other long term (current) drug therapy: Secondary | ICD-10-CM | POA: Diagnosis not present

## 2016-06-24 DIAGNOSIS — M1612 Unilateral primary osteoarthritis, left hip: Secondary | ICD-10-CM | POA: Diagnosis not present

## 2016-06-24 DIAGNOSIS — Z87891 Personal history of nicotine dependence: Secondary | ICD-10-CM | POA: Diagnosis not present

## 2016-06-24 DIAGNOSIS — Z91041 Radiographic dye allergy status: Secondary | ICD-10-CM

## 2016-06-24 DIAGNOSIS — E785 Hyperlipidemia, unspecified: Secondary | ICD-10-CM | POA: Diagnosis not present

## 2016-06-24 DIAGNOSIS — Z471 Aftercare following joint replacement surgery: Secondary | ICD-10-CM | POA: Diagnosis not present

## 2016-06-24 DIAGNOSIS — Z87442 Personal history of urinary calculi: Secondary | ICD-10-CM | POA: Diagnosis not present

## 2016-06-24 DIAGNOSIS — K219 Gastro-esophageal reflux disease without esophagitis: Secondary | ICD-10-CM | POA: Diagnosis present

## 2016-06-24 DIAGNOSIS — Z888 Allergy status to other drugs, medicaments and biological substances status: Secondary | ICD-10-CM | POA: Diagnosis not present

## 2016-06-24 DIAGNOSIS — M169 Osteoarthritis of hip, unspecified: Secondary | ICD-10-CM | POA: Diagnosis present

## 2016-06-24 DIAGNOSIS — I10 Essential (primary) hypertension: Secondary | ICD-10-CM | POA: Diagnosis not present

## 2016-06-24 DIAGNOSIS — Z96642 Presence of left artificial hip joint: Secondary | ICD-10-CM | POA: Diagnosis not present

## 2016-06-24 HISTORY — PX: TOTAL HIP ARTHROPLASTY: SHX124

## 2016-06-24 LAB — TYPE AND SCREEN
ABO/RH(D): A POS
Antibody Screen: NEGATIVE

## 2016-06-24 LAB — ABO/RH: ABO/RH(D): A POS

## 2016-06-24 SURGERY — ARTHROPLASTY, HIP, TOTAL, ANTERIOR APPROACH
Anesthesia: Spinal | Site: Hip | Laterality: Left

## 2016-06-24 MED ORDER — CEFAZOLIN SODIUM-DEXTROSE 2-4 GM/100ML-% IV SOLN
2.0000 g | INTRAVENOUS | Status: AC
  Administered 2016-06-24: 2 g via INTRAVENOUS

## 2016-06-24 MED ORDER — PHENOL 1.4 % MT LIQD: 1.0000 | OROMUCOSAL | Status: DC | PRN

## 2016-06-24 MED ORDER — OXYCODONE HCL 5 MG PO TABS
5.0000 mg | ORAL_TABLET | ORAL | Status: DC | PRN
  Administered 2016-06-24 (×2): 5 mg via ORAL
  Administered 2016-06-24 – 2016-06-26 (×8): 10 mg via ORAL
  Filled 2016-06-24: qty 1
  Filled 2016-06-24 (×6): qty 2
  Filled 2016-06-24 (×3): qty 1
  Filled 2016-06-24 (×2): qty 2

## 2016-06-24 MED ORDER — SODIUM CHLORIDE 0.9 % IV SOLN
INTRAVENOUS | Status: DC
  Administered 2016-06-24: 21:00:00 via INTRAVENOUS
  Administered 2016-06-24: 1000 mL via INTRAVENOUS

## 2016-06-24 MED ORDER — ONDANSETRON HCL 4 MG PO TABS: 4.0000 mg | ORAL_TABLET | Freq: Four times a day (QID) | ORAL | Status: DC | PRN

## 2016-06-24 MED ORDER — BUPIVACAINE HCL (PF) 0.5 % IJ SOLN
INTRAMUSCULAR | Status: DC | PRN
  Administered 2016-06-24: 3 mL via INTRATHECAL

## 2016-06-24 MED ORDER — LIDOCAINE 2% (20 MG/ML) 5 ML SYRINGE
INTRAMUSCULAR | Status: AC
  Filled 2016-06-24: qty 5

## 2016-06-24 MED ORDER — DIPHENHYDRAMINE HCL 12.5 MG/5ML PO ELIX: 12.5000 mg | ORAL_SOLUTION | ORAL | Status: DC | PRN

## 2016-06-24 MED ORDER — TRAMADOL HCL 50 MG PO TABS
50.0000 mg | ORAL_TABLET | Freq: Four times a day (QID) | ORAL | Status: DC | PRN
  Administered 2016-06-24: 18:00:00 100 mg via ORAL
  Filled 2016-06-24: qty 2

## 2016-06-24 MED ORDER — ONDANSETRON HCL 4 MG/2ML IJ SOLN
INTRAMUSCULAR | Status: AC
  Filled 2016-06-24: qty 2

## 2016-06-24 MED ORDER — ACETAMINOPHEN 10 MG/ML IV SOLN
INTRAVENOUS | Status: AC
  Filled 2016-06-24: qty 100

## 2016-06-24 MED ORDER — POLYETHYLENE GLYCOL 3350 17 G PO PACK: 17.0000 g | PACK | Freq: Every day | ORAL | Status: DC | PRN

## 2016-06-24 MED ORDER — ACETAMINOPHEN 325 MG PO TABS: 650.0000 mg | ORAL_TABLET | Freq: Four times a day (QID) | ORAL | Status: DC | PRN

## 2016-06-24 MED ORDER — METOCLOPRAMIDE HCL 5 MG/ML IJ SOLN: 5.0000 mg | Freq: Three times a day (TID) | INTRAMUSCULAR | Status: DC | PRN

## 2016-06-24 MED ORDER — PHENYLEPHRINE HCL 10 MG/ML IJ SOLN
INTRAVENOUS | Status: DC | PRN
  Administered 2016-06-24: 50 ug/min via INTRAVENOUS

## 2016-06-24 MED ORDER — PHENYLEPHRINE 40 MCG/ML (10ML) SYRINGE FOR IV PUSH (FOR BLOOD PRESSURE SUPPORT)
PREFILLED_SYRINGE | INTRAVENOUS | Status: AC
  Filled 2016-06-24: qty 10

## 2016-06-24 MED ORDER — DEXAMETHASONE SODIUM PHOSPHATE 10 MG/ML IJ SOLN: 10.0000 mg | Freq: Once | INTRAMUSCULAR | Status: DC

## 2016-06-24 MED ORDER — BISACODYL 10 MG RE SUPP: 10.0000 mg | Freq: Every day | RECTAL | Status: DC | PRN

## 2016-06-24 MED ORDER — DEXAMETHASONE SODIUM PHOSPHATE 4 MG/ML IJ SOLN
INTRAMUSCULAR | Status: DC | PRN
  Administered 2016-06-24: 10 mg via INTRAVENOUS

## 2016-06-24 MED ORDER — PHENYLEPHRINE HCL 10 MG/ML IJ SOLN
INTRAMUSCULAR | Status: DC | PRN
  Administered 2016-06-24 (×3): 80 ug via INTRAVENOUS

## 2016-06-24 MED ORDER — FENTANYL CITRATE (PF) 100 MCG/2ML IJ SOLN: 25.0000 ug | INTRAMUSCULAR | Status: DC | PRN

## 2016-06-24 MED ORDER — MIDAZOLAM HCL 2 MG/2ML IJ SOLN
INTRAMUSCULAR | Status: AC
  Filled 2016-06-24: qty 2

## 2016-06-24 MED ORDER — PANTOPRAZOLE SODIUM 40 MG PO TBEC: 80.0000 mg | DELAYED_RELEASE_TABLET | Freq: Every day | ORAL | Status: DC

## 2016-06-24 MED ORDER — EPHEDRINE 5 MG/ML INJ
INTRAVENOUS | Status: AC
  Filled 2016-06-24: qty 10

## 2016-06-24 MED ORDER — PROPOFOL 10 MG/ML IV BOLUS
INTRAVENOUS | Status: AC
  Filled 2016-06-24: qty 60

## 2016-06-24 MED ORDER — MIDAZOLAM HCL 5 MG/5ML IJ SOLN
INTRAMUSCULAR | Status: DC | PRN
  Administered 2016-06-24: 2 mg via INTRAVENOUS

## 2016-06-24 MED ORDER — MENTHOL 3 MG MT LOZG: 1.0000 | LOZENGE | OROMUCOSAL | Status: DC | PRN

## 2016-06-24 MED ORDER — OXYCODONE HCL 5 MG PO TABS: 5.0000 mg | ORAL_TABLET | Freq: Once | ORAL | Status: DC | PRN

## 2016-06-24 MED ORDER — EPHEDRINE SULFATE 50 MG/ML IJ SOLN
INTRAMUSCULAR | Status: DC | PRN
  Administered 2016-06-24 (×2): 10 mg via INTRAVENOUS

## 2016-06-24 MED ORDER — ACETAMINOPHEN 10 MG/ML IV SOLN
1000.0000 mg | Freq: Once | INTRAVENOUS | Status: AC
  Administered 2016-06-24: 1000 mg via INTRAVENOUS

## 2016-06-24 MED ORDER — DEXAMETHASONE SODIUM PHOSPHATE 10 MG/ML IJ SOLN
INTRAMUSCULAR | Status: AC
  Filled 2016-06-24: qty 1

## 2016-06-24 MED ORDER — DEXTROSE 5 % IV SOLN
500.0000 mg | Freq: Four times a day (QID) | INTRAVENOUS | Status: DC | PRN
  Filled 2016-06-24: qty 5

## 2016-06-24 MED ORDER — CEFAZOLIN SODIUM-DEXTROSE 2-4 GM/100ML-% IV SOLN
2.0000 g | Freq: Four times a day (QID) | INTRAVENOUS | Status: AC
  Administered 2016-06-24 (×2): 2 g via INTRAVENOUS
  Filled 2016-06-24 (×2): qty 100

## 2016-06-24 MED ORDER — ACETAMINOPHEN 500 MG PO TABS
1000.0000 mg | ORAL_TABLET | Freq: Four times a day (QID) | ORAL | Status: AC
  Administered 2016-06-24 – 2016-06-25 (×4): 1000 mg via ORAL
  Filled 2016-06-24 (×4): qty 2

## 2016-06-24 MED ORDER — DOCUSATE SODIUM 100 MG PO CAPS
100.0000 mg | ORAL_CAPSULE | Freq: Two times a day (BID) | ORAL | Status: DC
  Administered 2016-06-24 – 2016-06-26 (×4): 100 mg via ORAL
  Filled 2016-06-24 (×4): qty 1

## 2016-06-24 MED ORDER — CHLORHEXIDINE GLUCONATE 4 % EX LIQD: 60.0000 mL | Freq: Once | CUTANEOUS | Status: DC

## 2016-06-24 MED ORDER — ONDANSETRON HCL 4 MG/2ML IJ SOLN: 4.0000 mg | Freq: Four times a day (QID) | INTRAMUSCULAR | Status: DC | PRN

## 2016-06-24 MED ORDER — BUPIVACAINE HCL (PF) 0.25 % IJ SOLN
INTRAMUSCULAR | Status: DC | PRN
  Administered 2016-06-24: 30 mL

## 2016-06-24 MED ORDER — MAGNESIUM OXIDE 400 (241.3 MG) MG PO TABS
400.0000 mg | ORAL_TABLET | Freq: Two times a day (BID) | ORAL | Status: DC
  Administered 2016-06-24 – 2016-06-26 (×4): 400 mg via ORAL
  Filled 2016-06-24 (×4): qty 1

## 2016-06-24 MED ORDER — BISOPROLOL-HYDROCHLOROTHIAZIDE 5-6.25 MG PO TABS
1.0000 | ORAL_TABLET | Freq: Every day | ORAL | Status: DC
  Administered 2016-06-26: 1 via ORAL
  Filled 2016-06-24 (×2): qty 1

## 2016-06-24 MED ORDER — TRANEXAMIC ACID 1000 MG/10ML IV SOLN
1000.0000 mg | INTRAVENOUS | Status: AC
  Administered 2016-06-24: 1000 mg via INTRAVENOUS
  Filled 2016-06-24: qty 1100

## 2016-06-24 MED ORDER — CEFAZOLIN SODIUM-DEXTROSE 2-4 GM/100ML-% IV SOLN
INTRAVENOUS | Status: AC
  Filled 2016-06-24: qty 100

## 2016-06-24 MED ORDER — RIVAROXABAN 10 MG PO TABS
10.0000 mg | ORAL_TABLET | Freq: Every day | ORAL | Status: DC
  Administered 2016-06-25 – 2016-06-26 (×2): 10 mg via ORAL
  Filled 2016-06-24 (×2): qty 1

## 2016-06-24 MED ORDER — BUPIVACAINE HCL (PF) 0.25 % IJ SOLN
INTRAMUSCULAR | Status: AC
  Filled 2016-06-24: qty 30

## 2016-06-24 MED ORDER — DEXAMETHASONE SODIUM PHOSPHATE 10 MG/ML IJ SOLN
10.0000 mg | Freq: Once | INTRAMUSCULAR | Status: AC
  Administered 2016-06-25: 09:00:00 10 mg via INTRAVENOUS
  Filled 2016-06-24: qty 1

## 2016-06-24 MED ORDER — PHENYLEPHRINE HCL 10 MG/ML IJ SOLN
INTRAMUSCULAR | Status: AC
  Filled 2016-06-24: qty 1

## 2016-06-24 MED ORDER — FENTANYL CITRATE (PF) 100 MCG/2ML IJ SOLN
INTRAMUSCULAR | Status: AC
  Filled 2016-06-24: qty 2

## 2016-06-24 MED ORDER — ACETAMINOPHEN 650 MG RE SUPP: 650.0000 mg | Freq: Four times a day (QID) | RECTAL | Status: DC | PRN

## 2016-06-24 MED ORDER — LIDOCAINE HCL (CARDIAC) 20 MG/ML IV SOLN
INTRAVENOUS | Status: DC | PRN
  Administered 2016-06-24: 100 mg via INTRAVENOUS

## 2016-06-24 MED ORDER — TRANEXAMIC ACID 1000 MG/10ML IV SOLN
1000.0000 mg | Freq: Once | INTRAVENOUS | Status: AC
  Administered 2016-06-24: 14:00:00 1000 mg via INTRAVENOUS
  Filled 2016-06-24: qty 1100

## 2016-06-24 MED ORDER — ONDANSETRON HCL 4 MG/2ML IJ SOLN
INTRAMUSCULAR | Status: DC | PRN
  Administered 2016-06-24: 4 mg via INTRAVENOUS

## 2016-06-24 MED ORDER — LACTATED RINGERS IV SOLN
INTRAVENOUS | Status: DC
  Administered 2016-06-24: 07:00:00 via INTRAVENOUS

## 2016-06-24 MED ORDER — TAMSULOSIN HCL 0.4 MG PO CAPS
0.4000 mg | ORAL_CAPSULE | Freq: Every day | ORAL | Status: DC
  Administered 2016-06-25 – 2016-06-26 (×2): 0.4 mg via ORAL
  Filled 2016-06-24 (×2): qty 1

## 2016-06-24 MED ORDER — PROPOFOL 500 MG/50ML IV EMUL
INTRAVENOUS | Status: DC | PRN
  Administered 2016-06-24: 75 ug/kg/min via INTRAVENOUS

## 2016-06-24 MED ORDER — OXYCODONE HCL 5 MG/5ML PO SOLN
5.0000 mg | Freq: Once | ORAL | Status: DC | PRN
Start: 2016-06-24 — End: 2016-06-24

## 2016-06-24 MED ORDER — PROPOFOL 10 MG/ML IV BOLUS
INTRAVENOUS | Status: AC
  Filled 2016-06-24: qty 20

## 2016-06-24 MED ORDER — FENTANYL CITRATE (PF) 100 MCG/2ML IJ SOLN
INTRAMUSCULAR | Status: DC | PRN
  Administered 2016-06-24 (×2): 50 ug via INTRAVENOUS

## 2016-06-24 MED ORDER — GLYCOPYRROLATE 0.2 MG/ML IV SOSY
PREFILLED_SYRINGE | INTRAVENOUS | Status: AC
  Filled 2016-06-24: qty 5

## 2016-06-24 MED ORDER — METOCLOPRAMIDE HCL 5 MG PO TABS: 5.0000 mg | ORAL_TABLET | Freq: Three times a day (TID) | ORAL | Status: DC | PRN

## 2016-06-24 MED ORDER — LOSARTAN POTASSIUM 50 MG PO TABS
100.0000 mg | ORAL_TABLET | Freq: Every day | ORAL | Status: DC
  Administered 2016-06-26: 11:00:00 100 mg via ORAL
  Filled 2016-06-24: qty 2

## 2016-06-24 MED ORDER — GLYCOPYRROLATE 0.2 MG/ML IJ SOLN
INTRAMUSCULAR | Status: DC | PRN
  Administered 2016-06-24: 0.2 mg via INTRAVENOUS

## 2016-06-24 MED ORDER — FLEET ENEMA 7-19 GM/118ML RE ENEM: 1.0000 | ENEMA | Freq: Once | RECTAL | Status: DC | PRN

## 2016-06-24 MED ORDER — SODIUM CHLORIDE 0.9 % IR SOLN
Status: DC | PRN
  Administered 2016-06-24: 1000 mL

## 2016-06-24 MED ORDER — METHOCARBAMOL 500 MG PO TABS
500.0000 mg | ORAL_TABLET | Freq: Four times a day (QID) | ORAL | Status: DC | PRN
  Administered 2016-06-24 – 2016-06-26 (×4): 500 mg via ORAL
  Filled 2016-06-24 (×4): qty 1

## 2016-06-24 SURGICAL SUPPLY — 36 items
BAG DECANTER FOR FLEXI CONT (MISCELLANEOUS) ×2 IMPLANT
BAG SPEC THK2 15X12 ZIP CLS (MISCELLANEOUS)
BAG ZIPLOCK 12X15 (MISCELLANEOUS) IMPLANT
BLADE SAG 18X100X1.27 (BLADE) ×2 IMPLANT
CAPT HIP TOTAL 2 ×1 IMPLANT
CLOTH BEACON ORANGE TIMEOUT ST (SAFETY) ×2 IMPLANT
COVER PERINEAL POST (MISCELLANEOUS) ×2 IMPLANT
COVER SURGICAL LIGHT HANDLE (MISCELLANEOUS) ×2 IMPLANT
DECANTER SPIKE VIAL GLASS SM (MISCELLANEOUS) ×2 IMPLANT
DRAPE STERI IOBAN 125X83 (DRAPES) ×2 IMPLANT
DRAPE U-SHAPE 47X51 STRL (DRAPES) ×4 IMPLANT
DRSG ADAPTIC 3X8 NADH LF (GAUZE/BANDAGES/DRESSINGS) ×2 IMPLANT
DRSG MEPILEX BORDER 4X4 (GAUZE/BANDAGES/DRESSINGS) ×2 IMPLANT
DRSG MEPILEX BORDER 4X8 (GAUZE/BANDAGES/DRESSINGS) ×2 IMPLANT
DURAPREP 26ML APPLICATOR (WOUND CARE) ×2 IMPLANT
ELECT REM PT RETURN 15FT ADLT (MISCELLANEOUS) ×2 IMPLANT
EVACUATOR 1/8 PVC DRAIN (DRAIN) ×2 IMPLANT
GLOVE BIO SURGEON STRL SZ7.5 (GLOVE) ×2 IMPLANT
GLOVE BIO SURGEON STRL SZ8 (GLOVE) ×4 IMPLANT
GLOVE BIOGEL PI IND STRL 7.5 (GLOVE) IMPLANT
GLOVE BIOGEL PI IND STRL 8 (GLOVE) ×2 IMPLANT
GLOVE BIOGEL PI INDICATOR 7.5 (GLOVE) ×4
GLOVE BIOGEL PI INDICATOR 8 (GLOVE) ×2
GOWN STRL REUS W/TWL LRG LVL3 (GOWN DISPOSABLE) ×3 IMPLANT
GOWN STRL REUS W/TWL XL LVL3 (GOWN DISPOSABLE) ×3 IMPLANT
PACK ANTERIOR HIP CUSTOM (KITS) ×2 IMPLANT
STRIP CLOSURE SKIN 1/2X4 (GAUZE/BANDAGES/DRESSINGS) ×2 IMPLANT
SUT ETHIBOND NAB CT1 #1 30IN (SUTURE) ×2 IMPLANT
SUT MNCRL AB 4-0 PS2 18 (SUTURE) ×2 IMPLANT
SUT STRATAFIX 0 PDS 27 VIOLET (SUTURE) ×2
SUT VIC AB 2-0 CT1 27 (SUTURE) ×4
SUT VIC AB 2-0 CT1 TAPERPNT 27 (SUTURE) ×2 IMPLANT
SUTURE STRATFX 0 PDS 27 VIOLET (SUTURE) ×1 IMPLANT
SYR 50ML LL SCALE MARK (SYRINGE) IMPLANT
TRAY FOLEY W/METER SILVER 16FR (SET/KITS/TRAYS/PACK) ×2 IMPLANT
YANKAUER SUCT BULB TIP 10FT TU (MISCELLANEOUS) ×2 IMPLANT

## 2016-06-24 NOTE — Discharge Instructions (Addendum)
° °Dr. Frank [name] °Total Joint Specialist °[facility] °[address] °[place], [place] [address] °[phone] ° °ANTERIOR APPROACH TOTAL HIP REPLACEMENT POSTOPERATIVE DIRECTIONS ° ° °Hip Rehabilitation, Guidelines Following Surgery  °The results of a hip operation are greatly improved after range of motion and muscle strengthening exercises. Follow all safety measures which are given to protect your hip. If any of these exercises cause increased pain or swelling in your joint, decrease the amount until you are comfortable again. Then slowly increase the exercises. Call your caregiver if you have problems or questions.  ° °HOME CARE INSTRUCTIONS  °Remove items at home which could result in a fall. This includes throw rugs or furniture in walking pathways.  °· ICE to the affected hip every three hours for 30 minutes at a time and then as needed for pain and swelling.  Continue to use ice on the hip for pain and swelling from surgery. You may notice swelling that will progress down to the foot and ankle.  This is normal after surgery.  Elevate the leg when you are not up walking on it.   °· Continue to use the breathing machine which will help keep your temperature down.  It is common for your temperature to cycle up and down following surgery, especially at night when you are not up moving around and exerting yourself.  The breathing machine keeps your lungs expanded and your temperature down. ° ° °DIET °You may resume your previous home diet once your are discharged from the hospital. ° °DRESSING / WOUND CARE / SHOWERING °You may shower 3 days after surgery, but keep the wounds dry during showering.  You may use an occlusive plastic wrap (Press'n Seal for example), NO SOAKING/SUBMERGING IN THE BATHTUB.  If the bandage gets wet, change with a clean dry gauze.  If the incision gets wet, pat the wound dry with a clean towel. °You may start showering once you are discharged home but do not  submerge the incision under water. Just pat the incision dry and apply a dry gauze dressing on daily. °Change the surgical dressing daily and reapply a dry dressing each time. ° °ACTIVITY °Walk with your walker as instructed. °Use walker as long as suggested by your caregivers. °Avoid periods of inactivity such as sitting longer than an hour when not asleep. This helps prevent blood clots.  °You may resume a sexual relationship in one month or when given the OK by your doctor.  °You may return to work once you are cleared by your doctor.  °Do not drive a car for 6 weeks or until released by you surgeon.  °Do not drive while taking narcotics. ° °WEIGHT BEARING °Weight bearing as tolerated with assist device (walker, cane, etc) as directed, use it as long as suggested by your surgeon or therapist, typically at least 4-6 weeks. ° °POSTOPERATIVE CONSTIPATION PROTOCOL °Constipation - defined medically as fewer than three stools per week and severe constipation as less than one stool per week. ° °One of the most common issues patients have following surgery is constipation.  Even if you have a regular bowel pattern at home, your normal regimen is likely to be disrupted due to multiple reasons following surgery.  Combination of anesthesia, postoperative narcotics, change in appetite and fluid intake all can affect your bowels.  In order to avoid complications following surgery, here are some recommendations in order to help you during your recovery period. ° °Colace (docusate) - Pick up an over-the-counter   form of Colace or another stool softener and take twice a day as long as you are requiring postoperative pain medications.  Take with a full glass of water daily.  If you experience loose stools or diarrhea, hold the colace until you stool forms back up.  If your symptoms do not get better within 1 week or if they get worse, check with your doctor. ° °Dulcolax (bisacodyl) - Pick up over-the-counter and take as directed  by the product packaging as needed to assist with the movement of your bowels.  Take with a full glass of water.  Use this product as needed if not relieved by Colace only.  ° °MiraLax (polyethylene glycol) - Pick up over-the-counter to have on hand.  MiraLax is a solution that will increase the amount of water in your bowels to assist with bowel movements.  Take as directed and can mix with a glass of water, juice, soda, coffee, or tea.  Take if you go more than two days without a movement. °Do not use MiraLax more than once per day. Call your doctor if you are still constipated or irregular after using this medication for 7 days in a row. ° °If you continue to have problems with postoperative constipation, please contact the office for further assistance and recommendations.  If you experience "the worst abdominal pain ever" or develop nausea or vomiting, please contact the office immediatly for further recommendations for treatment. ° °ITCHING ° If you experience itching with your medications, try taking only a single pain pill, or even half a pain pill at a time.  You can also use Benadryl over the counter for itching or also to help with sleep.  ° °TED HOSE STOCKINGS °Wear the elastic stockings on both legs for three weeks following surgery during the day but you may remove then at night for sleeping. ° °MEDICATIONS °See your medication summary on the “After Visit Summary” that the nursing staff will review with you prior to discharge.  You may have some home medications which will be placed on hold until you complete the course of blood thinner medication.  It is important for you to complete the blood thinner medication as prescribed by your surgeon.  Continue your approved medications as instructed at time of discharge. ° °PRECAUTIONS °If you experience chest pain or shortness of breath - call 911 immediately for transfer to the hospital emergency department.  °If you develop a fever greater that 101 F,  purulent drainage from wound, increased redness or drainage from wound, foul odor from the wound/dressing, or calf pain - CONTACT YOUR SURGEON.   °                                                °FOLLOW-UP APPOINTMENTS °Make sure you keep all of your appointments after your operation with your surgeon and caregivers. You should call the office at the above phone number and make an appointment for approximately two weeks after the date of your surgery or on the date instructed by your surgeon outlined in the "After Visit Summary". ° °RANGE OF MOTION AND STRENGTHENING EXERCISES  °These exercises are designed to help you keep full movement of your hip joint. Follow your caregiver's or physical therapist's instructions. Perform all exercises about fifteen times, three times per day or as directed. Exercise both hips, even if you   have had only one joint replacement. These exercises can be done on a training (exercise) mat, on the floor, on a table or on a bed. Use whatever works the best and is most comfortable for you. Use music or television while you are exercising so that the exercises are a pleasant break in your day. This will make your life better with the exercises acting as a break in routine you can look forward to.  °Lying on your back, slowly slide your foot toward your buttocks, raising your knee up off the floor. Then slowly slide your foot back down until your leg is straight again.  °Lying on your back spread your legs as far apart as you can without causing discomfort.  °Lying on your side, raise your upper leg and foot straight up from the floor as far as is comfortable. Slowly lower the leg and repeat.  °Lying on your back, tighten up the muscle in the front of your thigh (quadriceps muscles). You can do this by keeping your leg straight and trying to raise your heel off the floor. This helps strengthen the largest muscle supporting your knee.  °Lying on your back, tighten up the muscles of your  buttocks both with the legs straight and with the knee bent at a comfortable angle while keeping your heel on the floor.  ° °IF YOU ARE TRANSFERRED TO A SKILLED REHAB FACILITY °If the patient is transferred to a skilled rehab facility following release from the hospital, a list of the current medications will be sent to the facility for the patient to continue.  When discharged from the skilled rehab facility, please have the facility set up the patient's Home Health Physical Therapy prior to being released. Also, the skilled facility will be responsible for providing the patient with their medications at time of release from the facility to include their pain medication, the muscle relaxants, and their blood thinner medication. If the patient is still at the rehab facility at time of the two week follow up appointment, the skilled rehab facility will also need to assist the patient in arranging follow up appointment in our office and any transportation needs. ° °MAKE SURE YOU:  °Understand these instructions.  °Get help right away if you are not doing well or get worse.  ° ° °Pick up stool softner and laxative for home use following surgery while on pain medications. °Do not submerge incision under water. °Please use good hand washing techniques while changing dressing each day. °May shower starting three days after surgery. °Please use a clean towel to pat the incision dry following showers. °Continue to use ice for pain and swelling after surgery. °Do not use any lotions or creams on the incision until instructed by your surgeon. ° °Take Xarelto for two and a half more weeks following discharge from the hospital, then discontinue Xarelto. °Once the patient has completed the Xarelto, they may resume the 81 mg Aspirin. ° ° ° °Information on my medicine - XARELTO® (Rivaroxaban) ° °This medication education was reviewed with me or my healthcare representative as part of my discharge preparation.  ° °Why was Xarelto®  prescribed for you? °Xarelto® was prescribed for you to reduce the risk of blood clots forming after orthopedic surgery. The medical term for these abnormal blood clots is venous thromboembolism (VTE). ° °What do you need to know about xarelto® ? °Take your Xarelto® ONCE DAILY at the same time every day. °You may take it either with or without food. ° °  If you have difficulty swallowing the tablet whole, you may crush it and mix in applesauce just prior to taking your dose. ° °Take Xarelto® exactly as prescribed by your doctor and DO NOT stop taking Xarelto® without talking to the doctor who prescribed the medication.  Stopping without other VTE prevention medication to take the place of Xarelto® may increase your risk of developing a clot. ° °After discharge, you should have regular check-up appointments with your healthcare provider that is prescribing your Xarelto®.   ° °What do you do if you miss a dose? °If you miss a dose, take it as soon as you remember on the same day then continue your regularly scheduled once daily regimen the next day. Do not take two doses of Xarelto® on the same day.  ° °Important Safety Information °A possible side effect of Xarelto® is bleeding. You should call your healthcare provider right away if you experience any of the following: °? Bleeding from an injury or your nose that does not stop. °? Unusual colored urine (red or dark brown) or unusual colored stools (red or black). °? Unusual bruising for unknown reasons. °? A serious fall or if you hit your head (even if there is no bleeding). ° °Some medicines may interact with Xarelto® and might increase your risk of bleeding while on Xarelto®. To help avoid this, consult your healthcare provider or pharmacist prior to using any new prescription or non-prescription medications, including herbals, vitamins, non-steroidal anti-inflammatory drugs (NSAIDs) and supplements. ° °This website has more information on Xarelto®:  [email or web address].xarelto.com. ° ° °

## 2016-06-24 NOTE — Evaluation (Signed)
Physical Therapy Evaluation Patient Details Name: [name] [name] MRN: [id number] DOB: May 22, 1940 Today's Date: 06/24/2016   History of Present Illness  Pt s/p L THR and with hx of lung lobectomy and fainting   Clinical Impression  Pt s/p L THR and presents with decreased L LE strength/ROM and post op pain limiting functional mobility.  Pt should progress to dc home with family assist.   Follow Up Recommendations Home health PT    Equipment Recommendations  None recommended by PT    Recommendations for Other Services OT consult     Precautions / Restrictions Precautions Precautions: Fall Restrictions Weight Bearing Restrictions: No Other Position/Activity Restrictions: WBAT      Mobility  Bed Mobility Overal bed mobility: Needs Assistance Bed Mobility: Supine to Sit     Supine to sit: Min assist     General bed mobility comments: cues for sequence and use of R LE to self assist  Transfers Overall transfer level: Needs assistance Equipment used: Rolling walker (2 wheeled) Transfers: Sit to/from Stand Sit to Stand: Min assist         General transfer comment: cues for LE management and use of UEs to self assist  Ambulation/Gait Ambulation/Gait assistance: Min assist Ambulation Distance (Feet): 25 Feet Assistive device: Rolling walker (2 wheeled) Gait Pattern/deviations: Step-to pattern;Decreased step length - right;Decreased step length - left;Shuffle;Trunk flexed Gait velocity: decr Gait velocity interpretation: Below normal speed for age/gender General Gait Details: cues for sequence, posture and position from [employer]            Wheelchair Mobility    Modified Rankin (Stroke Patients Only)       Balance                                             Pertinent Vitals/Pain Pain Assessment: 0-10 Pain Score: 7  Pain Location: L hip Pain Descriptors / Indicators: Aching;Sore;Burning Pain Intervention(s): Limited activity  within patient's tolerance;Monitored during session;Premedicated before session;Patient requesting pain meds-RN notified;Ice applied    Home Living Family/patient expects to be discharged to:: Private residence Living Arrangements: Spouse/significant other Available Help at Discharge: Family Type of Home: House Home Access: Stairs to enter Entrance Stairs-Rails: Right Entrance Stairs-Number of Steps: 4 Home Layout: [place]: Environmental consultant - 2 wheels;Cane - single point      Prior Function Level of Independence: Independent;Independent with assistive device(s)               Hand Dominance        Extremity/Trunk Assessment   Upper Extremity Assessment Upper Extremity Assessment: Overall WFL for tasks assessed    Lower Extremity Assessment Lower Extremity Assessment: LLE deficits/detail    Cervical / Trunk Assessment Cervical / Trunk Assessment: Normal  Communication   Communication: No difficulties  Cognition Arousal/Alertness: Awake/alert Behavior During Therapy: WFL for tasks assessed/performed Overall Cognitive Status: Within Functional Limits for tasks assessed                                        General Comments      Exercises Total Joint Exercises Ankle Circles/Pumps: AROM;Both;15 reps;Supine   Assessment/Plan    PT Assessment Patient needs continued PT services  PT Problem List Decreased strength;Decreased range of motion;Decreased activity tolerance;Decreased mobility;Decreased knowledge of  use of DME;Pain;Obesity       PT Treatment Interventions DME instruction;Gait training;Stair training;Functional mobility training;Therapeutic activities;Therapeutic exercise;Patient/family education    PT Goals (Current goals can be found in the Care Plan section)  Acute Rehab PT Goals Patient Stated Goal: Regain IND PT Goal Formulation: With patient Time For Goal Achievement: 06/27/16 Potential to Achieve Goals: Good     Frequency 7X/week   Barriers to discharge        Co-evaluation               End of Session Equipment Utilized During Treatment: Gait belt Activity Tolerance: Patient tolerated treatment well Patient left: in chair;with call bell/phone within reach Nurse Communication: Mobility status PT Visit Diagnosis: Unsteadiness on feet (R26.81);Difficulty in walking, not elsewhere classified (R26.2)    Time: 4037-5436 PT Time Calculation (min) (ACUTE ONLY): 26 min   Charges:   PT Evaluation $PT Eval Low Complexity: 1 Procedure PT Treatments $Gait Training: 8-22 mins   PT G Codes:        Pg [phone]   Letizia Hook 06/24/2016, 6:51 PM

## 2016-06-24 NOTE — Anesthesia Preprocedure Evaluation (Signed)
Anesthesia Evaluation  Patient identified by MRN, date of birth, ID band Patient awake    Reviewed: Allergy & Precautions, H&P , NPO status , Patient's Chart, lab work & pertinent test results  Airway Mallampati: II   Neck ROM: full    Dental   Pulmonary former smoker,    breath sounds clear to auscultation       Cardiovascular hypertension,  Rhythm:regular Rate:Normal     Neuro/Psych    GI/Hepatic GERD  ,  Endo/Other    Renal/GU Renal diseasestones     Musculoskeletal  (+) Arthritis ,   Abdominal   Peds  Hematology   Anesthesia Other Findings   Reproductive/Obstetrics                             Anesthesia Physical Anesthesia Plan  ASA: II  Anesthesia Plan: Spinal   Post-op Pain Management:    Induction: Intravenous  Airway Management Planned: Simple Face Mask  Additional Equipment:   Intra-op Plan:   Post-operative Plan:   Informed Consent: I have reviewed the patients History and Physical, chart, labs and discussed the procedure including the risks, benefits and alternatives for the proposed anesthesia with the patient or authorized representative who has indicated his/her understanding and acceptance.     Plan Discussed with: CRNA, Anesthesiologist and Surgeon  Anesthesia Plan Comments:         Anesthesia Quick Evaluation

## 2016-06-24 NOTE — Anesthesia Postprocedure Evaluation (Signed)
Anesthesia Post Note  Patient: [name] [name]  Procedure(s) Performed: Procedure(s) (LRB): LEFT TOTAL HIP ARTHROPLASTY ANTERIOR APPROACH (Left)  Patient location during evaluation: PACU Anesthesia Type: Spinal Level of consciousness: oriented and awake and alert Pain management: pain level controlled Vital Signs Assessment: post-procedure vital signs reviewed and stable Respiratory status: spontaneous breathing, respiratory function stable and patient connected to nasal cannula oxygen Cardiovascular status: blood pressure returned to baseline and stable Postop Assessment: no headache and no backache Anesthetic complications: no       Last Vitals:  Vitals:   06/24/16 1301 06/24/16 1408  BP: 130/84 111/66  Pulse: 61 78  Resp: 16 16  Temp: 36.3 C 36.4 C    Last Pain:  Vitals:   06/24/16 1408  TempSrc: Oral  PainSc:                  [name]

## 2016-06-24 NOTE — Transfer of Care (Signed)
Immediate Anesthesia Transfer of Care Note  Patient: [name] [name]  Procedure(s) Performed: Procedure(s): LEFT TOTAL HIP ARTHROPLASTY ANTERIOR APPROACH (Left)  Patient Location: PACU  Anesthesia Type:Spinal  Level of Consciousness:  sedated, patient cooperative and responds to stimulation  Airway & Oxygen Therapy:Patient Spontanous Breathing and Patient connected to face mask oxgen  Post-op Assessment:  Report given to PACU RN and Post -op Vital signs reviewed and stable  Post vital signs:  Reviewed and stable  Last Vitals:  Vitals:   06/24/16 0636  BP: (!) 157/94  Pulse: 65  Resp: 18  Temp: 75.1 C    Complications: No apparent anesthesia complications

## 2016-06-24 NOTE — Op Note (Signed)
OPERATIVE REPORT- TOTAL HIP ARTHROPLASTY   PREOPERATIVE DIAGNOSIS: Osteoarthritis of the Left hip.   POSTOPERATIVE DIAGNOSIS: Osteoarthritis of the Left  hip.   PROCEDURE: Left total hip arthroplasty, anterior approach.   SURGEON: Gaynelle Arabian, MD   ASSISTANT: Arlee Muslim, PA-C  ANESTHESIA:  Spinal  ESTIMATED BLOOD LOSS:-450 ml    DRAINS: Hemovac x1.   COMPLICATIONS: None   CONDITION: PACU - hemodynamically stable.   BRIEF CLINICAL NOTE: [name] [name] is a 76 y.o. male who has advanced end-  stage arthritis of their Left  hip with progressively worsening pain and  dysfunction.The patient has failed nonoperative management and presents for  total hip arthroplasty.   PROCEDURE IN DETAIL: After successful administration of spinal  anesthetic, the traction boots for the [facility] bed were placed on both  feet and the patient was placed onto the [facility] bed, boots placed into the leg  holders. The Left hip was then isolated from the perineum with plastic  drapes and prepped and draped in the usual sterile fashion. ASIS and  greater trochanter were marked and a oblique incision was made, starting  at about 1 cm lateral and 2 cm distal to the ASIS and coursing towards  the anterior cortex of the femur. The skin was cut with a 10 blade  through subcutaneous tissue to the level of the fascia overlying the  tensor fascia lata muscle. The fascia was then incised in line with the  incision at the junction of the anterior third and posterior 2/3rd. The  muscle was teased off the fascia and then the interval between the TFL  and the rectus was developed. The Hohmann retractor was then placed at  the top of the femoral neck over the capsule. The vessels overlying the  capsule were cauterized and the fat on top of the capsule was removed.  A Hohmann retractor was then placed anterior underneath the rectus  femoris to give exposure to the entire anterior capsule. A T-shaped  capsulotomy  was performed. The edges were tagged and the femoral head  was identified.       Osteophytes are removed off the superior acetabulum.  The femoral neck was then cut in situ with an oscillating saw. Traction  was then applied to the left lower extremity utilizing the [facility]  traction. The femoral head was then removed. Retractors were placed  around the acetabulum and then circumferential removal of the labrum was  performed. Osteophytes were also removed. Reaming starts at 47 mm to  medialize and  Increased in 2 mm increments to 53 mm. We reamed in  approximately 40 degrees of abduction, 20 degrees anteversion. A 54 mm  pinnacle acetabular shell was then impacted in anatomic position under  fluoroscopic guidance with excellent purchase. We did not need to place  any additional dome screws. A 36 mm Neutral + 4 marathon liner was then  placed into the acetabular shell.       The femoral lift was then placed along the lateral aspect of the femur  just distal to the vastus ridge. The leg was  externally rotated and capsule  was stripped off the inferior aspect of the femoral neck down to the  level of the lesser trochanter, this was done with electrocautery. The femur was lifted after this was performed. The  leg was then placed in an extended and adducted position essentially delivering the femur. We also removed the capsule superiorly and the piriformis from the piriformis  fossa to gain excellent exposure of the  proximal femur. Rongeur was used to remove some cancellous bone to get  into the lateral portion of the proximal femur for placement of the  initial starter reamer. The starter broaches was placed  the starter broach  and was shown to go down the center of the canal. Broaching  with the  Corail system was then performed starting at size 8, coursing  Up to size 11. A size 11 had excellent torsional and rotational  and axial stability. The trial high offset neck was then placed  with a 36  + 1.5 trial head. The hip was then reduced. We confirmed that  the stem was in the canal both on AP and lateral x-rays. It also has excellent sizing. The hip was reduced with outstanding stability through full extension and full external rotation.. AP pelvis was taken and the leg lengths were measured and found to be 5 mm short. Hip was then dislocated again and the femoral head and neck removed. The  femoral broach was removed. Size 11 Corail stem with a high offset collarless neck to maintain length was then impacted into the femur following native anteversion. Has  excellent purchase in the canal. Excellent torsional and rotational and  axial stability. It is confirmed to be in the canal on AP and lateral  fluoroscopic views. The 36 + 1.5 ceramic head was placed and the hip  reduced with outstanding stability. Again AP pelvis was taken and it  confirmed that the leg lengths were equal. The wound was then copiously  irrigated with saline solution and the capsule reattached and repaired  with Ethibond suture. 30 ml of .25% Bupivicaine was  injected into the capsule and into the edge of the tensor fascia lata as well as subcutaneous tissue. The fascia overlying the tensor fascia lata was then closed with a running #1 V-Loc. Subcu was closed with interrupted 2-0 Vicryl and subcuticular running 4-0 Monocryl. Incision was cleaned  and dried. Steri-Strips and a bulky sterile dressing applied. Hemovac  drain was hooked to suction and then the patient was awakened and transported to  recovery in stable condition.        Please note that a surgical assistant was a medical necessity for this procedure to perform it in a safe and expeditious manner. Assistant was necessary to provide appropriate retraction of vital neurovascular structures and to prevent femoral fracture and allow for anatomic placement of the prosthesis.  Gaynelle Arabian, M.D.

## 2016-06-24 NOTE — Interval H&P Note (Signed)
History and Physical Interval Note:  06/24/2016 7:05 AM  [name] [name]  has presented today for surgery, with the diagnosis of Osteoarthritis left hip  The various methods of treatment have been discussed with the patient and family. After consideration of risks, benefits and other options for treatment, the patient has consented to  Procedure(s): LEFT TOTAL HIP ARTHROPLASTY ANTERIOR APPROACH (Left) as a surgical intervention .  The patient's history has been reviewed, patient examined, no change in status, stable for surgery.  I have reviewed the patient's chart and labs.  Questions were answered to the patient's satisfaction.     [name]

## 2016-06-24 NOTE — Anesthesia Procedure Notes (Signed)
Spinal  Patient location during procedure: OR Start time: 06/24/2016 8:21 AM End time: 06/24/2016 8:26 AM Staffing Anesthesiologist: Marcie Bal, Cannie Muckle Performed: anesthesiologist  Preanesthetic Checklist Completed: patient identified, surgical consent, pre-op evaluation, timeout performed, IV checked, risks and benefits discussed and monitors and equipment checked Spinal Block Patient position: sitting Prep: DuraPrep Patient monitoring: cardiac monitor, continuous pulse ox and blood pressure Approach: midline Location: L3-4 Injection technique: single-shot Needle Needle type: Pencan  Needle gauge: 24 G Needle length: 9 cm Assessment Sensory level: T10 Additional Notes Functioning IV was confirmed and monitors were applied. Sterile prep and drape, including hand hygiene and sterile gloves were used. The patient was positioned and the spine was prepped. The skin was anesthetized with lidocaine.  Free flow of clear CSF was obtained prior to injecting local anesthetic into the CSF.  The spinal needle aspirated freely following injection.  The needle was carefully withdrawn.  The patient tolerated the procedure well.

## 2016-06-24 NOTE — Interval H&P Note (Signed)
History and Physical Interval Note:  06/24/2016 7:06 AM  [name] [name]  has presented today for surgery, with the diagnosis of Osteoarthritis left hip  The various methods of treatment have been discussed with the patient and family. After consideration of risks, benefits and other options for treatment, the patient has consented to  Procedure(s): LEFT TOTAL HIP ARTHROPLASTY ANTERIOR APPROACH (Left) as a surgical intervention .  The patient's history has been reviewed, patient examined, no change in status, stable for surgery.  I have reviewed the patient's chart and labs.  Questions were answered to the patient's satisfaction.     [name]

## 2016-06-24 NOTE — H&P (View-Only) (Signed)
[name] [name] DOB: September 13, 1940 Married / Language: English / Race: White Male Date of Admission:  06/24/2016 CC:  Left hip pain History of Present Illness The patient is a 76 year old male who comes in for a preoperative History and Physical. The patient is scheduled for a left total hip arthroplasty (anterior) to be performed by Dr. Dione Plover. [name], MD at [facility] on 06-24-2016. The patient is a 76 year old male who was seen for left hip problems including pain symptoms that have been present for many month(s). The symptoms began without any known injury. Symptoms reported include hip pain (lt buttock pain and lt thigh pain , he does have some low back pain too.), tenderness and catching The patient reports symptoms radiating to the: left groin, left thigh and lower back. The patient describes the hip problem as throbbing. Onset of symptoms was gradual.The symptoms are described as moderate in severity.The patient feels as if their symptoms are notes that they are unchanged. Symptoms are exacerbated by walking and lying on the affected side. The patient is not currently being treated for this problem. Prior to being seen the patient was previously evaluated by a primary physician (then [facility]). Previous workup for this problem has included hip x-rays. Previous treatment for this problem has included corticosteroid injection (in the lt groin with relief for 3-4 weeks). It hurts in the anterior groin around the side and occasionally into the buttock. It is worse with activities and better with rest. It is felt that he would benefit from ondergoing a left total hip replacement. He was originally scheduled for hip replacement back in February of this year but contracted the flu. He has since recovered and is now ready to proceed with surgery at this time. They have been treated conservatively in the past for the above stated problem and despite conservative measures, they continue to have  progressive pain and severe functional limitations and dysfunction. They have failed non-operative management including home exercise, medications, and injections. It is felt that they would benefit from undergoing total joint replacement. Risks and benefits of the procedure have been discussed with the patient and they elect to proceed with surgery. There are no active contraindications to surgery such as ongoing infection or rapidly progressive neurological disease.  Problem List/Past Medical Primary localized osteoarthritis of left hip (M16.12)  Chronic Pain  Gastroesophageal Reflux Disease  High blood pressure  Kidney Stone  Asthma  childhood Parathyroid Tumor   Allergies  No Known Drug Allergies  Iodinated Contrast  Short of breath He IS able to use topical Betadine  Family History Diabetes Mellitus  Sister.  Social History  Marital status  Married. Children  2 Current occupation  Rental Property Current drinker  12/13/2015: Currently drinks wine less than 5 times per week Exercise  Exercises daily; does running / walking No history of drug/alcohol rehab  Not under pain contract  Number of flights of stairs before winded  2-3 Tobacco / smoke exposure  12/13/2015: no Tobacco use  Never smoker. 12/13/2015 Advance Directives  Living Will, Healthcare POA  Medication History Omeprazole (40MG  Capsule DR, Oral) Active. Tamsulosin HCl (0.4MG  Capsule, Oral) Active. Meloxicam (15MG  Tablet, Oral) Active. Bisoprolol-Hydrochlorothiazide (5-6.25MG  Tablet, Oral) Active. Fenofibrate (145MG  Tablet, Oral) Active. Losartan Potassium (100MG  Tablet, Oral) Active. Fish Oil + D3 (Oral) Specific strength unknown - Active. Vit D-Vit E-Safflower Oil (External) Active. Magnesium (Oral) Specific strength unknown - Active. Vit Calcium Citrate + D (Oral) Specific strength unknown -  Active. Flax Seed Oil (Oral) Specific strength unknown - Active. ASA Arthritis  Strength/Antacid (Oral) Specific strength unknown - Active. Vit B6-Vit B12-FA-Ginger (Transdermal) Specific strength unknown - Active.  Past Surgical History  Sinus Surgery  Esophageal Dilatation  Parathyroid Surgery  Multiple Urology Procedures for Kidney Stones    Review of Systems General Not Present- Chills, Fatigue, Fever, Memory Loss, Night Sweats, Weight Gain and Weight Loss. Skin Not Present- Eczema, Hives, Itching, Lesions and Rash. HEENT Not Present- Dentures, Double Vision, Headache, Hearing Loss, Tinnitus and Visual Loss. Respiratory Present- Cough. Not Present- Allergies, Chronic Cough, Coughing up blood, Shortness of breath at rest and Shortness of breath with exertion. Cardiovascular Not Present- Chest Pain, Difficulty Breathing Lying Down, Murmur, Palpitations, Racing/skipping heartbeats and Swelling. Gastrointestinal Not Present- Abdominal Pain, Bloody Stool, Constipation, Diarrhea, Difficulty Swallowing, Heartburn, Jaundice, Loss of appetitie, Nausea and Vomiting. Male Genitourinary Not Present- Blood in Urine, Discharge, Flank Pain, Incontinence, Painful Urination, Urgency, Urinary frequency, Urinary Retention, Urinating at Night and Weak urinary stream. Musculoskeletal Present- Back Pain and Morning Stiffness. Not Present- Joint Pain, Joint Swelling, Muscle Pain, Muscle Weakness and Spasms. Neurological Not Present- Blackout spells, Difficulty with balance, Dizziness, Paralysis, Tremor and Weakness. Psychiatric Not Present- Insomnia.  Vitals Weight: 250 lb Height: 71in Weight was reported by patient. Height was reported by patient. Body Surface Area: 2.32 m Body Mass Index: 34.87 kg/m  Pulse: 62 (Regular)  BP: 128/72 (Sitting, Right Arm, Standard)   Physical Exam General Mental Status -Alert, cooperative and good historian. General Appearance-pleasant, Not in acute distress. Orientation-Oriented X3. Build & Nutrition-Well nourished and  Well developed.  Head and Neck Head-normocephalic, atraumatic . Neck Global Assessment - supple, no bruit auscultated on the right, no bruit auscultated on the left.  Eye Vision-Wears corrective lenses. Pupil - Bilateral-Regular and Round. Motion - Bilateral-EOMI.  Chest and Lung Exam Auscultation Breath sounds - clear at anterior chest wall and clear at posterior chest wall. Adventitious sounds - No Adventitious sounds.  Cardiovascular Auscultation Rhythm - Regular rate and rhythm. Heart Sounds - S1 WNL and S2 WNL. Murmurs & Other Heart Sounds - Auscultation of the heart reveals - No Murmurs.  Abdomen Inspection Contour - Generalized mild distention. Palpation/Percussion Tenderness - Abdomen is non-tender to palpation. Rigidity (guarding) - Abdomen is soft. Auscultation Auscultation of the abdomen reveals - Bowel sounds normal.  Male Genitourinary Note: Not done, not pertinent to present illness   Musculoskeletal Note: External rotation of 20 and internal rotation of 30 with pain in the groin. Sensation and circulation are intact. Motor intact.  RADIOGRAPHS X-rays reveal advanced osteoarthritic change with near bone-on-bone findings in the left hip, only early moderate changes in the right hip.  Assessment & Plan  Primary localized osteoarthritis of left hip (M16.12)  Note:Surgical Plans: Left Total Hip Replacement - Anterior Approach  Disposition: Home  PCP: Dr. Unk Pinto - patient has been seen in the office and given a verbal okay to proceed.  IV TXA  Anesthesia Issues: None  Patient was instructed on what medications to stop prior to surgery.  Signed electronically by Joelene Millin, III PA-C

## 2016-06-25 LAB — BASIC METABOLIC PANEL
Anion gap: 7 (ref 5–15)
BUN: 16 mg/dL (ref 6–20)
CO2: 23 mmol/L (ref 22–32)
Calcium: 8.7 mg/dL — ABNORMAL LOW (ref 8.9–10.3)
Chloride: 103 mmol/L (ref 101–111)
Creatinine, Ser: 1.33 mg/dL — ABNORMAL HIGH (ref 0.61–1.24)
GFR calc Af Amer: 60 mL/min — ABNORMAL LOW (ref >60)
GFR calc non Af Amer: 51 mL/min — ABNORMAL LOW (ref >60)
Glucose, Bld: 146 mg/dL — ABNORMAL HIGH (ref 65–99)
Potassium: 4.2 mmol/L (ref 3.5–5.1)
Sodium: 133 mmol/L — ABNORMAL LOW (ref 135–145)

## 2016-06-25 LAB — CBC
HCT: 36.4 % — ABNORMAL LOW (ref 39.0–52.0)
Hemoglobin: 12.7 g/dL — ABNORMAL LOW (ref 13.0–17.0)
MCH: 31.1 pg (ref 26.0–34.0)
MCHC: 34.9 g/dL (ref 30.0–36.0)
MCV: 89 fL (ref 78.0–100.0)
Platelets: 217 10*3/uL (ref 150–400)
RBC: 4.09 MIL/uL — ABNORMAL LOW (ref 4.22–5.81)
RDW: 12.6 % (ref 11.5–15.5)
WBC: 16.6 10*3/uL — ABNORMAL HIGH (ref 4.0–10.5)

## 2016-06-25 MED ORDER — OMEPRAZOLE 20 MG PO CPDR
40.0000 mg | DELAYED_RELEASE_CAPSULE | Freq: Every day | ORAL | Status: DC
  Administered 2016-06-25 – 2016-06-26 (×2): 40 mg via ORAL
  Filled 2016-06-25 (×2): qty 2

## 2016-06-25 MED ORDER — SODIUM CHLORIDE 0.9 % IV BOLUS (SEPSIS)
250.0000 mL | Freq: Once | INTRAVENOUS | Status: AC
  Administered 2016-06-25: 250 mL via INTRAVENOUS

## 2016-06-25 MED ORDER — NON FORMULARY: 40.0000 mg | Freq: Every day | Status: DC

## 2016-06-25 NOTE — Evaluation (Signed)
Occupational Therapy Evaluation Patient Details Name: [name] [name] MRN: [id number] DOB: 09/10/1940 Today's Date: 06/25/2016    History of Present Illness Pt s/p L THR and with hx of lung lobectomy and fainting    Clinical Impression   This 76 year old man was admitted for the above sx.  He will benefit from continued OT in acute setting.  Pt needs to be mostly mod I for home as wife cannot assist him much.  He was independent prior to admission and now needs min guard for mobility and min A for LB adls.    Follow Up Recommendations  Supervision/Assistance - 24 hour    Equipment Recommendations  None recommended by OT    Recommendations for Other Services       Precautions / Restrictions Precautions Precautions: Fall Restrictions Weight Bearing Restrictions: No Other Position/Activity Restrictions: WBAT      Mobility Bed Mobility         Supine to sit: Min assist     General bed mobility comments: assist for LLE  Transfers   Equipment used: Rolling walker (2 wheeled)   Sit to Stand: Min guard         General transfer comment: for safety. Cues for UE/LE placement    Balance                                           ADL either performed or assessed with clinical judgement   ADL Overall ADL's : Needs assistance/impaired     Grooming: Set up;Sitting   Upper Body Bathing: Set up;Sitting   Lower Body Bathing: Minimal assistance;Sit to/from stand   Upper Body Dressing : Set up;Sitting   Lower Body Dressing: Minimal assistance;Sit to/from stand   Toilet Transfer: Min guard;Ambulation;BSC;RW   Toileting- Water quality scientist and Hygiene: Min guard;Sit to/from stand   Tub/ Shower Transfer: Walk-in shower;Min guard;Ambulation;3 in 1;Rolling walker     General ADL Comments: pt may be able to have daughter assist with ted hose.  He feels comfortable with shower and toilet transfers.  He doesn't have anything to push up from.  Used  toilet seat and walker     Vision         Perception     Praxis      Pertinent Vitals/Pain Pain Score: 7  Pain Location: L hip Pain Descriptors / Indicators: Aching Pain Intervention(s): Limited activity within patient's tolerance;Monitored during session;Premedicated before session;Repositioned;Ice applied     Hand Dominance     Extremity/Trunk Assessment Upper Extremity Assessment Upper Extremity Assessment: Overall WFL for tasks assessed           Communication Communication Communication: No difficulties   Cognition Arousal/Alertness: Awake/alert Behavior During Therapy: WFL for tasks assessed/performed Overall Cognitive Status: Within Functional Limits for tasks assessed                                     General Comments       Exercises     Shoulder Instructions      Home Living Family/patient expects to be discharged to:: Private residence Living Arrangements: Spouse/significant other Available Help at Discharge: Family               Bathroom Shower/Tub: Walk-in Corporate treasurer Toilet: Handicapped height  Home Equipment: [place] - 2 wheels;Cane - single point;Shower seat   Additional Comments: new comfort height commodes      Prior Functioning/Environment Level of Independence: Independent;Independent with assistive device(s)        Comments: wife is unsteady and uses cane        OT Problem List: Decreased activity tolerance;Pain;Decreased knowledge of use of DME or AE      OT Treatment/Interventions: Self-care/ADL training;DME and/or AE instruction;Patient/family education    OT Goals(Current goals can be found in the care plan section) Acute Rehab OT Goals Patient Stated Goal: Regain IND OT Goal Formulation: With patient Time For Goal Achievement: 07/02/16 Potential to Achieve Goals: Good ADL Goals Pt Will Perform Lower Body Bathing: with modified independence;with adaptive equipment;sit to/from  stand Pt Will Perform Lower Body Dressing: with modified independence;with adaptive equipment;sit to/from stand Pt Will Transfer to Toilet: with modified independence;ambulating (high commode) Pt Will Perform Tub/Shower Transfer: Shower transfer;with supervision;ambulating;shower seat  OT Frequency: Min 2X/week   Barriers to D/C:            Co-evaluation              End of Session Nurse Communication: Patient requests pain meds  Activity Tolerance: Patient tolerated treatment well Patient left: in chair;with call bell/phone within reach  OT Visit Diagnosis: Pain Pain - Right/Left: Left Pain - part of body: Hip                Time: 1937-9024 OT Time Calculation (min): 29 min Charges:  OT General Charges $OT Visit: 1 Procedure OT Evaluation $OT Eval Low Complexity: 1 Procedure OT Treatments $Self Care/Home Management : 8-22 mins G-Codes:     [place], OTR/L [phone] 06/25/2016  Blayre Papania 06/25/2016, 10:46 AM

## 2016-06-25 NOTE — Progress Notes (Signed)
   Subjective: 1 Day Post-Op Procedure(s) (LRB): LEFT TOTAL HIP ARTHROPLASTY ANTERIOR APPROACH (Left) Patient reports pain as mild.   Patient seen in rounds by Dr. Wynelle Link. Patient is well, but has had some minor complaints of pain in the hip, requiring pain medications We will resume therapy today. Walked 25 feet yesterday. Plan is to go Home after hospital stay.  Objective: Vital signs in last 24 hours: Temp:  [97.4 F (36.3 C)-98.2 F (36.8 C)] 97.6 F (36.4 C) (04/26 0604) Pulse Rate:  [52-78] 52 (04/26 0604) Resp:  [7-20] 16 (04/26 0604) BP: (86-133)/(57-84) 104/57 (04/26 0604) SpO2:  [92 %-99 %] 97 % (04/26 0604) Weight:  [116.6 kg (257 lb)] 116.6 kg (257 lb) (04/25 1200)  Intake/Output from previous day:  Intake/Output Summary (Last 24 hours) at 06/25/16 0756 Last data filed at 06/25/16 0630  Gross per 24 hour  Intake             4130 ml  Output           3329.5 ml  Net            800.5 ml    Intake/Output this shift: No intake/output data recorded.  Labs:  Recent Labs  06/25/16 0513  HGB 12.7*    Recent Labs  06/25/16 0513  WBC 16.6*  RBC 4.09*  HCT 36.4*  PLT 217    Recent Labs  06/25/16 0513  NA 133*  K 4.2  CL 103  CO2 23  BUN 16  CREATININE 1.33*  GLUCOSE 146*  CALCIUM 8.7*   No results for input(s): LABPT, INR in the last 72 hours.  EXAM General - Patient is Alert, Appropriate and Oriented Extremity - Neurovascular intact Sensation intact distally Intact pulses distally Dorsiflexion/Plantar flexion intact Dressing - dressing C/D/I Motor Function - intact, moving foot and toes well on exam.  Hemovac pulled without difficulty.  Past Medical History:  Diagnosis Date  . Arthritis   . ED (erectile dysfunction)   . Enlarged prostate with lower urinary tract symptoms (LUTS)   . GERD (gastroesophageal reflux disease)   . History of chronic gastritis   . History of colon polyps    hyperplastic 2006  . History of esophageal  stricture    S/P  DILATATION 2009; 2010; 2010 2012  . History of kidney stones    hx. multiple kidney stones  . History of kidney stones   . History of primary hyperparathyroidism    s/p  left superior parathyroidectomy 07-10-2014  . Hyperlipidemia   . Hypertension   . Left ureteral stone   . Nephrolithiasis    left side   . Pre-diabetes     Assessment/Plan: 1 Day Post-Op Procedure(s) (LRB): LEFT TOTAL HIP ARTHROPLASTY ANTERIOR APPROACH (Left) Principal Problem:   OA (osteoarthritis) of hip  Estimated body mass index is 35.84 kg/m as calculated from the following:   Height as of this encounter: 5\' 11"  (1.803 m).   Weight as of this encounter: 116.6 kg (257 lb). Advance diet Up with therapy Plan for discharge tomorrow Discharge home with home health  DVT Prophylaxis - Xarelto Weight Bearing As Tolerated left Leg Hemovac Pulled Begin Therapy  Arlee Muslim, PA-C Orthopaedic Surgery 06/25/2016, 7:56 AM

## 2016-06-25 NOTE — Progress Notes (Signed)
Physical Therapy Treatment Patient Details Name: [name] [name] MRN: [id number] DOB: [birth year] Today's Date: 06/25/2016    History of Present Illness Pt s/p L THR and with hx of lung lobectomy and fainting     PT Comments    POD # 1 am session Assisted with amb in hallway then demonstrated and instructed how to use a belt to self assist L LE on bed.    Follow Up Recommendations  Home health PT     Equipment Recommendations  None recommended by PT    Recommendations for Other Services       Precautions / Restrictions Precautions Precautions: Fall Restrictions Weight Bearing Restrictions: No Other Position/Activity Restrictions: WBAT    Mobility  Bed Mobility    assisted back to bed MinGuard Assist using a belt to self assist L LE up onto bed           General bed mobility comments: OOB in recliner  Transfers Overall transfer level: Needs assistance Equipment used: Rolling walker (2 wheeled) Transfers: Sit to/from Stand Sit to Stand: Min guard         General transfer comment: for safety. Cues for UE/LE placement  Ambulation/Gait Ambulation/Gait assistance: Min guard;Min assist Ambulation Distance (Feet): 48 Feet Assistive device: Rolling walker (2 wheeled) Gait Pattern/deviations: Step-to pattern;Decreased step length - right;Decreased step length - left;Shuffle;Trunk flexed Gait velocity: decr   General Gait Details: < 25% cues for sequence, posture and position from [employer]            Wheelchair Mobility    Modified Rankin (Stroke Patients Only)       Balance                                            Cognition Arousal/Alertness: Awake/alert Behavior During Therapy: WFL for tasks assessed/performed Overall Cognitive Status: Within Functional Limits for tasks assessed                                        Exercises      General Comments        Pertinent Vitals/Pain Pain Assessment:  No/denies pain Pain Score: 6  Pain Location: L hip Pain Descriptors / Indicators: Aching;Operative site guarding;Sore;Tightness Pain Intervention(s): Monitored during session;Repositioned;Ice applied    Home Living                      Prior Function            PT Goals (current goals can now be found in the care plan section) Progress towards PT goals: Progressing toward goals    Frequency    7X/week      PT Plan      Co-evaluation             End of Session Equipment Utilized During Treatment: Gait belt Activity Tolerance: Patient tolerated treatment well Patient left: in chair;with call bell/phone within reach Nurse Communication: Mobility status PT Visit Diagnosis: Unsteadiness on feet (R26.81);Difficulty in walking, not elsewhere classified (R26.2)     Time: 9233-0076 PT Time Calculation (min) (ACUTE ONLY): 17 min  Charges:  $Gait Training: 8-22 mins                    G Codes:  Rica Koyanagi  PTA [facility]  Acute  Rehab Pager      [phone]

## 2016-06-25 NOTE — Progress Notes (Signed)
Physical Therapy Treatment Patient Details Name: [name] [name] MRN: [id number] DOB: 1940/04/18 Today's Date: 06/25/2016    History of Present Illness Pt s/p L THR and with hx of lung lobectomy and fainting     PT Comments    POD # 1 pm session Assisted to bathroom then amb a greater distance in hallway.  Performed some THR TE's following HEP handout.  Instructed on proper tech, freq and use of ICE.  Pt plans to D/C to home tomorrow.   Follow Up Recommendations  Home health PT     Equipment Recommendations  None recommended by PT    Recommendations for Other Services       Precautions / Restrictions Precautions Precautions: Fall Restrictions Weight Bearing Restrictions: No Other Position/Activity Restrictions: WBAT    Mobility  Bed Mobility               General bed mobility comments: OOB in recliner  Transfers Overall transfer level: Needs assistance Equipment used: Rolling walker (2 wheeled) Transfers: Sit to/from Stand Sit to Stand: Min guard         General transfer comment: for safety. Cues for UE/LE placement  Ambulation/Gait Ambulation/Gait assistance: Min guard;Min assist Ambulation Distance (Feet): 48 Feet Assistive device: Rolling walker (2 wheeled) Gait Pattern/deviations: Step-to pattern;Decreased step length - right;Decreased step length - left;Shuffle;Trunk flexed Gait velocity: decr   General Gait Details: < 25% cues for sequence, posture and position from [employer]            Wheelchair Mobility    Modified Rankin (Stroke Patients Only)       Balance                                            Cognition Arousal/Alertness: Awake/alert Behavior During Therapy: WFL for tasks assessed/performed Overall Cognitive Status: Within Functional Limits for tasks assessed                                        Exercises   Total Hip Replacement TE's 10 reps ankle pumps 10 reps knee  presses 10 reps heel slides 10 reps SAQ's 10 reps ABD Followed by ICE    General Comments        Pertinent Vitals/Pain Pain Assessment: No/denies pain Pain Score: 6  Pain Location: L hip Pain Descriptors / Indicators: Aching;Operative site guarding;Sore;Tightness Pain Intervention(s): Monitored during session;Repositioned;Ice applied    Home Living                      Prior Function            PT Goals (current goals can now be found in the care plan section) Progress towards PT goals: Progressing toward goals    Frequency    7X/week      PT Plan      Co-evaluation             End of Session Equipment Utilized During Treatment: Gait belt Activity Tolerance: Patient tolerated treatment well Patient left: in chair;with call bell/phone within reach Nurse Communication: Mobility status PT Visit Diagnosis: Unsteadiness on feet (R26.81);Difficulty in walking, not elsewhere classified (R26.2)     Time: 2683-4196 PT Time Calculation (min) (ACUTE ONLY): 25 min  Charges:  $Gait Training:  8-22 mins $Therapeutic Exercise: 8-22 mins                    G Codes:       Rica Koyanagi  PTA [facility]  Acute  Rehab Pager      [phone]

## 2016-06-25 NOTE — Progress Notes (Signed)
Discharge plan:  HHPT ordered by MD. When choice offered, pt chose [facility]. Referral called to [facility] rep. Pt states he has a 2 wheeled RW, shower seat and two "high toilets" at home. No other CM needs communicated. Marney Doctor RN,BSN,BSN [phone]

## 2016-06-26 LAB — CBC
HCT: 36.2 % — ABNORMAL LOW (ref 39.0–52.0)
Hemoglobin: 12.6 g/dL — ABNORMAL LOW (ref 13.0–17.0)
MCH: 31.9 pg (ref 26.0–34.0)
MCHC: 34.8 g/dL (ref 30.0–36.0)
MCV: 91.6 fL (ref 78.0–100.0)
Platelets: 228 10*3/uL (ref 150–400)
RBC: 3.95 MIL/uL — ABNORMAL LOW (ref 4.22–5.81)
RDW: 13 % (ref 11.5–15.5)
WBC: 13.6 10*3/uL — ABNORMAL HIGH (ref 4.0–10.5)

## 2016-06-26 LAB — BASIC METABOLIC PANEL
Anion gap: 8 (ref 5–15)
BUN: 22 mg/dL — ABNORMAL HIGH (ref 6–20)
CO2: 26 mmol/L (ref 22–32)
Calcium: 8.9 mg/dL (ref 8.9–10.3)
Chloride: 102 mmol/L (ref 101–111)
Creatinine, Ser: 1.36 mg/dL — ABNORMAL HIGH (ref 0.61–1.24)
GFR calc Af Amer: 58 mL/min — ABNORMAL LOW (ref >60)
GFR calc non Af Amer: 50 mL/min — ABNORMAL LOW (ref >60)
Glucose, Bld: 128 mg/dL — ABNORMAL HIGH (ref 65–99)
Potassium: 3.9 mmol/L (ref 3.5–5.1)
Sodium: 136 mmol/L (ref 135–145)

## 2016-06-26 MED ORDER — RIVAROXABAN 10 MG PO TABS: 10.0000 mg | ORAL_TABLET | Freq: Every day | ORAL | 0 refills | Status: DC

## 2016-06-26 MED ORDER — METHOCARBAMOL 500 MG PO TABS: 500.0000 mg | ORAL_TABLET | Freq: Four times a day (QID) | ORAL | 0 refills | Status: DC | PRN

## 2016-06-26 MED ORDER — TRAMADOL HCL 50 MG PO TABS: 50.0000 mg | ORAL_TABLET | Freq: Four times a day (QID) | ORAL | 0 refills | Status: DC | PRN

## 2016-06-26 MED ORDER — OXYCODONE HCL 5 MG PO TABS: 5.0000 mg | ORAL_TABLET | ORAL | 0 refills | Status: DC | PRN

## 2016-06-26 NOTE — Discharge Summary (Signed)
Physician Discharge Summary   Patient ID: [name] [name] MRN: [id number] DOB/AGE: 74-Nov-1942 74 y.o.  Admit date: 06/24/2016 Discharge date: 06-26-2016  Primary Diagnosis:  Osteoarthritis of the Left hip.   Admission Diagnoses:  Past Medical History:  Diagnosis Date  . Arthritis   . ED (erectile dysfunction)   . Enlarged prostate with lower urinary tract symptoms (LUTS)   . GERD (gastroesophageal reflux disease)   . History of chronic gastritis   . History of colon polyps    hyperplastic 2006  . History of esophageal stricture    S/P  DILATATION 2009; 2010; 2010 2012  . History of kidney stones    hx. multiple kidney stones  . History of kidney stones   . History of primary hyperparathyroidism    s/p  left superior parathyroidectomy 07-10-2014  . Hyperlipidemia   . Hypertension   . Left ureteral stone   . Nephrolithiasis    left side   . Pre-diabetes    Discharge Diagnoses:   Principal Problem:   OA (osteoarthritis) of hip  Estimated body mass index is 35.84 kg/m as calculated from the following:   Height as of this encounter: _0  (1.803 m).   Weight as of this encounter: 116.6 kg (257 lb).  Procedure(s) (LRB): LEFT TOTAL HIP ARTHROPLASTY ANTERIOR APPROACH (Left)   Consults: None  HPI: [name] [name] is a 76 y.o. male who has advanced end-  stage arthritis of their Left  hip with progressively worsening pain and  dysfunction.The patient has failed nonoperative management and presents for  total hip arthroplasty.   Laboratory Data: Admission on 06/24/2016  Component Date Value Ref Range Status  . ABO/RH(D) 06/24/2016 A POS   Final  . Antibody Screen 06/24/2016 NEG   Final  . Sample Expiration 06/24/2016 06/27/2016   Final  . ABO/RH(D) 06/24/2016 A POS   Final  . WBC 06/25/2016 16.6* 4.0 - 10.5 K/uL Final  . RBC 06/25/2016 4.09* 4.22 - 5.81 MIL/uL Final  . Hemoglobin 06/25/2016 12.7* 13.0 - 17.0 g/dL Final  . HCT 06/25/2016 36.4* 39.0 - 52.0 % Final  . MCV  06/25/2016 89.0  78.0 - 100.0 fL Final  . MCH 06/25/2016 31.1  26.0 - 34.0 pg Final  . MCHC 06/25/2016 34.9  30.0 - 36.0 g/dL Final  . RDW 06/25/2016 12.6  11.5 - 15.5 % Final  . Platelets 06/25/2016 217  150 - 400 K/uL Final  . Sodium 06/25/2016 133* 135 - 145 mmol/L Final  . Potassium 06/25/2016 4.2  3.5 - 5.1 mmol/L Final  . Chloride 06/25/2016 103  101 - 111 mmol/L Final  . CO2 06/25/2016 23  22 - 32 mmol/L Final  . Glucose, Bld 06/25/2016 146* 65 - 99 mg/dL Final  . BUN 06/25/2016 16  6 - 20 mg/dL Final  . Creatinine, Ser 06/25/2016 1.33* 0.61 - 1.24 mg/dL Final  . Calcium 06/25/2016 8.7* 8.9 - 10.3 mg/dL Final  . GFR calc non Af Amer 06/25/2016 51* >60 mL/min Final  . GFR calc Af Amer 06/25/2016 60* >60 mL/min Final   Comment: (NOTE) The eGFR has been calculated using the CKD EPI equation. This calculation has not been validated in all clinical situations. eGFR's persistently <60 mL/min signify possible Chronic Kidney Disease.   . Anion gap 06/25/2016 7  5 - 15 Final  . WBC 06/26/2016 13.6* 4.0 - 10.5 K/uL Final  . RBC 06/26/2016 3.95* 4.22 - 5.81 MIL/uL Final  . Hemoglobin 06/26/2016 12.6* 13.0 - 17.0 g/dL  Final  . HCT 06/26/2016 36.2* 39.0 - 52.0 % Final  . MCV 06/26/2016 91.6  78.0 - 100.0 fL Final  . MCH 06/26/2016 31.9  26.0 - 34.0 pg Final  . MCHC 06/26/2016 34.8  30.0 - 36.0 g/dL Final  . RDW 06/26/2016 13.0  11.5 - 15.5 % Final  . Platelets 06/26/2016 228  150 - 400 K/uL Final  . Sodium 06/26/2016 136  135 - 145 mmol/L Final  . Potassium 06/26/2016 3.9  3.5 - 5.1 mmol/L Final  . Chloride 06/26/2016 102  101 - 111 mmol/L Final  . CO2 06/26/2016 26  22 - 32 mmol/L Final  . Glucose, Bld 06/26/2016 128* 65 - 99 mg/dL Final  . BUN 06/26/2016 22* 6 - 20 mg/dL Final  . Creatinine, Ser 06/26/2016 1.36* 0.61 - 1.24 mg/dL Final  . Calcium 06/26/2016 8.9  8.9 - 10.3 mg/dL Final  . GFR calc non Af Amer 06/26/2016 50* >60 mL/min Final  . GFR calc Af Amer 06/26/2016 58* >60  mL/min Final   Comment: (NOTE) The eGFR has been calculated using the CKD EPI equation. This calculation has not been validated in all clinical situations. eGFR's persistently <60 mL/min signify possible Chronic Kidney Disease.   Georgiann Hahn gap 06/26/2016 8  5 - 15 Final  Hospital Outpatient Visit on 06/02/2016  Component Date Value Ref Range Status  . Hgb A1c MFr Bld 06/02/2016 5.9* 4.8 - 5.6 % Final   Comment: (NOTE)         Pre-diabetes: 5.7 - 6.4         Diabetes: >6.4         Glycemic control for adults with diabetes: <7.0   . Mean Plasma Glucose 06/02/2016 123  mg/dL Final   Comment: (NOTE) Performed At: [facility] [place], [place] [id number] Lindon Romp MD NI:[id number]   . aPTT 06/02/2016 26  24 - 36 seconds Final  . WBC 06/02/2016 7.1  4.0 - 10.5 K/uL Final  . RBC 06/02/2016 4.69  4.22 - 5.81 MIL/uL Final  . Hemoglobin 06/02/2016 14.5  13.0 - 17.0 g/dL Final  . HCT 06/02/2016 42.0  39.0 - 52.0 % Final  . MCV 06/02/2016 89.6  78.0 - 100.0 fL Final  . MCH 06/02/2016 30.9  26.0 - 34.0 pg Final  . MCHC 06/02/2016 34.5  30.0 - 36.0 g/dL Final  . RDW 06/02/2016 12.8  11.5 - 15.5 % Final  . Platelets 06/02/2016 266  150 - 400 K/uL Final  . Sodium 06/02/2016 138  135 - 145 mmol/L Final  . Potassium 06/02/2016 4.3  3.5 - 5.1 mmol/L Final  . Chloride 06/02/2016 103  101 - 111 mmol/L Final  . CO2 06/02/2016 27  22 - 32 mmol/L Final  . Glucose, Bld 06/02/2016 108* 65 - 99 mg/dL Final  . BUN 06/02/2016 24* 6 - 20 mg/dL Final  . Creatinine, Ser 06/02/2016 1.64* 0.61 - 1.24 mg/dL Final  . Calcium 06/02/2016 9.7  8.9 - 10.3 mg/dL Final  . Total Protein 06/02/2016 7.1  6.5 - 8.1 g/dL Final  . Albumin 06/02/2016 4.2  3.5 - 5.0 g/dL Final  . AST 06/02/2016 31  15 - 41 U/L Final  . ALT 06/02/2016 20  17 - 63 U/L Final  . Alkaline Phosphatase 06/02/2016 28* 38 - 126 U/L Final  . Total Bilirubin 06/02/2016 1.0  0.3 - 1.2 mg/dL Final  . GFR calc non Af Amer  06/02/2016 40* >60 mL/min Final  .  GFR calc Af Amer 06/02/2016 46* >60 mL/min Final   Comment: (NOTE) The eGFR has been calculated using the CKD EPI equation. This calculation has not been validated in all clinical situations. eGFR's persistently <60 mL/min signify possible Chronic Kidney Disease.   . Anion gap 06/02/2016 8  5 - 15 Final  . Prothrombin Time 06/02/2016 13.6  11.4 - 15.2 seconds Final  . INR 06/02/2016 1.04   Final  . MRSA, PCR 06/02/2016 NEGATIVE  NEGATIVE Final  . Staphylococcus aureus 06/02/2016 NEGATIVE  NEGATIVE Final   Comment:        The Xpert SA Assay (FDA approved for NASAL specimens in patients over 61 years of age), is one component of a comprehensive surveillance program.  Test performance has been validated by [facility] for patients greater than or equal to 73 year old. It is not intended to diagnose infection nor to guide or monitor treatment.      X-Rays:Dg Pelvis Portable  Result Date: 06/24/2016 CLINICAL DATA:  Postop left hip replacement. EXAM: PORTABLE PELVIS 1-2 VIEWS COMPARISON:  None. FINDINGS: Total left hip arthroplasty. Components appear well positioned. No radiographically detectable complication. Other bones of the pelvis are unremarkable. IMPRESSION: Good appearance following total left hip arthroplasty. Electronically Signed   By: Nelson Chimes M.D.   On: 06/24/2016 10:54   Dg C-arm 1-60 Min-no Report  Result Date: 06/24/2016 Fluoroscopy was utilized by the requesting physician.  No radiographic interpretation.    EKG: Orders placed or performed in visit on 06/27/15  . EKG 12-Lead     Hospital Course: Patient was admitted to [facility] and taken to the OR and underwent the above state procedure without complications.  Patient tolerated the procedure well and was later transferred to the recovery room and then to the orthopaedic floor for postoperative care.  They were given PO and IV analgesics for pain control following  their surgery.  They were given 24 hours of postoperative antibiotics of  Anti-infectives    Start     Dose/Rate Route Frequency Ordered Stop   06/24/16 1430  ceFAZolin (ANCEF) IVPB 2g/100 mL premix     2 g 200 mL/hr over 30 Minutes Intravenous Every 6 hours 06/24/16 1214 06/24/16 2142   06/24/16 0718  ceFAZolin (ANCEF) 2-4 GM/100ML-% IVPB    Comments:  Harvell, Gwendolyn  : cabinet override      06/24/16 0718 06/24/16 0829   06/24/16 0634  ceFAZolin (ANCEF) IVPB 2g/100 mL premix     2 g 200 mL/hr over 30 Minutes Intravenous On call to O.R. 06/24/16 1610 06/24/16 0839     and started on DVT prophylaxis in the form of Xarelto.   PT and OT were ordered for total hip protocol.  The patient was allowed to be WBAT with therapy. Discharge planning was consulted to help with postop disposition and equipment needs.  Patient had a decent night on the evening of surgery.  They started to get up OOB with therapy on day one.  Hemovac drain was pulled without difficulty.  Continued to work with therapy into day two walking twice.  Dressing was changed on day two and the incision was healing well. Patient was seen in rounds by Dr. Wynelle Link and was ready to go home later that same day.  Discharge home with home health Diet - Cardiac diet Follow up - in 2 weeks Activity - WBAT Disposition - Home Condition Upon Discharge - Good D/C Meds - See DC Summary DVT Prophylaxis -  Xarelto   Discharge Instructions    Call MD / Call 911    Complete by:  As directed    If you experience chest pain or shortness of breath, CALL 911 and be transported to the hospital emergency room.  If you develope a fever above 101 F, pus (white drainage) or increased drainage or redness at the wound, or calf pain, call your surgeon's office.   Change dressing    Complete by:  As directed    You may change your dressing dressing daily with sterile 4 x 4 inch gauze dressing and paper tape.  Do not submerge the incision under water.    Constipation Prevention    Complete by:  As directed    Drink plenty of fluids.  Prune juice may be helpful.  You may use a stool softener, such as Colace (over the counter) 100 mg twice a day.  Use MiraLax (over the counter) for constipation as needed.   Diet - low sodium heart healthy    Complete by:  As directed    Discharge instructions    Complete by:  As directed    Pick up stool softner and laxative for home use following surgery while on pain medications. Do not submerge incision under water. Please use good hand washing techniques while changing dressing each day. May shower starting three days after surgery. Please use a clean towel to pat the incision dry following showers. Continue to use ice for pain and swelling after surgery. Do not use any lotions or creams on the incision until instructed by your surgeon.  Wear both TED hose on both legs during the day every day for three weeks, but may have off at night at home.  Postoperative Constipation Protocol  Constipation - defined medically as fewer than three stools per week and severe constipation as less than one stool per week.  One of the most common issues patients have following surgery is constipation.  Even if you have a regular bowel pattern at home, your normal regimen is likely to be disrupted due to multiple reasons following surgery.  Combination of anesthesia, postoperative narcotics, change in appetite and fluid intake all can affect your bowels.  In order to avoid complications following surgery, here are some recommendations in order to help you during your recovery period.  Colace (docusate) - Pick up an over-the-counter form of Colace or another stool softener and take twice a day as long as you are requiring postoperative pain medications.  Take with a full glass of water daily.  If you experience loose stools or diarrhea, hold the colace until you stool forms back up.  If your symptoms do not get better within 1  week or if they get worse, check with your doctor.  Dulcolax (bisacodyl) - Pick up over-the-counter and take as directed by the product packaging as needed to assist with the movement of your bowels.  Take with a full glass of water.  Use this product as needed if not relieved by Colace only.   MiraLax (polyethylene glycol) - Pick up over-the-counter to have on hand.  MiraLax is a solution that will increase the amount of water in your bowels to assist with bowel movements.  Take as directed and can mix with a glass of water, juice, soda, coffee, or tea.  Take if you go more than two days without a movement. Do not use MiraLax more than once per day. Call your doctor if you are still constipated or  irregular after using this medication for 7 days in a row.  If you continue to have problems with postoperative constipation, please contact the office for further assistance and recommendations.  If you experience "the worst abdominal pain ever" or develop nausea or vomiting, please contact the office immediatly for further recommendations for treatment.   Take Xarelto for two and a half more weeks, then discontinue Xarelto. Once the patient has completed the Xarelto, they may resume the 81 mg Aspirin.    Do not sit on low chairs, stoools or toilet seats, as it may be difficult to get up from low surfaces    Complete by:  As directed    Driving restrictions    Complete by:  As directed    No driving until released by the physician.   Increase activity slowly as tolerated    Complete by:  As directed    Lifting restrictions    Complete by:  As directed    No lifting until released by the physician.   Patient may shower    Complete by:  As directed    You may shower without a dressing once there is no drainage.  Do not wash over the wound.  If drainage remains, do not shower until drainage stops.   TED hose    Complete by:  As directed    Use stockings (TED hose) for 3 weeks on both leg(s).  You  may remove them at night for sleeping.   Weight bearing as tolerated    Complete by:  As directed    Laterality:  left   Extremity:  Lower     Allergies as of 06/26/2016      Reactions   Iohexol Shortness Of Breath   Ace Inhibitors Cough      Medication List    STOP taking these medications   aspirin EC 81 MG tablet   FLAXSEED OIL PO   meloxicam 15 MG tablet Commonly known as:  MOBIC   Omega-3 Fish Oil 1200 MG Caps   Vitamin D-3 5000 units Tabs   VITAMIN-B COMPLEX PO     TAKE these medications   bisoprolol-hydrochlorothiazide 5-6.25 MG tablet Commonly known as:  ZIAC TAKE 1 TABLET BY MOUTH EVERY MORNING FOR BLOOD PRESSURE   fenofibrate 145 MG tablet Commonly known as:  TRICOR TAKE 1 TABLET BY MOUTH DAILY FOR TRIGLYCERIDES/BLOOD FATS   losartan 100 MG tablet Commonly known as:  COZAAR TAKE 1 TABLET BY MOUTH DAILY FOR BLOOD PRESSURE.   magnesium oxide 400 MG tablet Commonly known as:  MAG-OX Take 400 mg by mouth 2 (two) times daily.   methocarbamol 500 MG tablet Commonly known as:  ROBAXIN Take 1 tablet (500 mg total) by mouth every 6 (six) hours as needed for muscle spasms.   omeprazole 40 MG capsule Commonly known as:  PRILOSEC TAKE 1 CAPSULE BY MOUTH DAILY.   oxybutynin 5 MG tablet Commonly known as:  DITROPAN Take 1 tablet (5 mg total) by mouth every 8 (eight) hours as needed for bladder spasms.   oxyCODONE 5 MG immediate release tablet Commonly known as:  Oxy IR/ROXICODONE Take 1-2 tablets (5-10 mg total) by mouth every 4 (four) hours as needed for moderate pain or severe pain.   rivaroxaban 10 MG Tabs tablet Commonly known as:  XARELTO Take 1 tablet (10 mg total) by mouth daily with breakfast. Take Xarelto for two and a half more weeks following discharge from the hospital, then discontinue Xarelto. Once the patient  has completed the Xarelto, they may resume the 81 mg Aspirin.   tamsulosin 0.4 MG Caps capsule Commonly known as:  FLOMAX Take 0.4  mg by mouth daily after breakfast.   traMADol 50 MG tablet Commonly known as:  ULTRAM Take 1-2 tablets (50-100 mg total) by mouth every 6 (six) hours as needed for moderate pain.      Follow-up Information    [facility] Follow up.   Specialty:  [facility] Contact information: [place] [address] [phone]        [name], MD. Schedule an appointment as soon as possible for a visit on 07/07/2016.   Specialty:  Orthopedic Surgery Contact information: [address] [place] [address] [phone]           Signed: Arlee Muslim, PA-C Orthopaedic Surgery 06/26/2016, 7:32 AM

## 2016-06-26 NOTE — Progress Notes (Signed)
Occupational Therapy Treatment Patient Details Name: [name] [name] MRN: [id number] DOB: 08/20/1940 Today's Date: 06/26/2016    History of present illness Pt s/p L THR and with hx of lung lobectomy and fainting    OT comments  Completed adl this am.  All education completed.  Pt steady walking:  Cues to extend leg prior to sitting  Follow Up Recommendations  Supervision/Assistance - 24 hour    Equipment Recommendations  None recommended by OT    Recommendations for Other Services      Precautions / Restrictions Precautions Precautions: Fall Restrictions Weight Bearing Restrictions: No Other Position/Activity Restrictions: WBAT       Mobility Bed Mobility               General bed mobility comments: OOB in recliner  Transfers   Equipment used: Rolling walker (2 wheeled)   Sit to Stand: Supervision         General transfer comment: cues to extend leg before sitting    Balance                                           ADL either performed or assessed with clinical judgement   ADL               Lower Body Bathing: Minimal assistance;Sit to/from stand       Lower Body Dressing: Supervision/safety;Sit to/from stand (except ted hose)   Toilet Transfer: Supervision/safety;Ambulation;Comfort height toilet             General ADL Comments: Pt bathed and got dressed. He has a long sponge and long shoehorn at home. He was able to don pants without AE, but pants were stretchy nylon and extra time and effort needed due to them catching on ted hose.  Showed him he could gather material and put at end of sock aide at home, if he wears these type of pants.  Tried new sock aide to don ted hose with partial success. Pt feels he will have his daughter come over and assist him.  Pt did not feel he needed to practice shower transfer again     Vision       Perception     Praxis      Cognition Arousal/Alertness: Awake/alert Behavior  During Therapy: [facility] for tasks assessed/performed Overall Cognitive Status: Within Functional Limits for tasks assessed                                          Exercises     Shoulder Instructions       General Comments      Pertinent Vitals/ Pain       Pain Score: 4  Pain Location: L hip Pain Descriptors / Indicators: Aching;Sore Pain Intervention(s): Limited activity within patient's tolerance;Monitored during session;Premedicated before session;Repositioned;Ice applied  Home Living                                          Prior Functioning/Environment              Frequency           Progress Toward Goals  OT Goals(current goals can  now be found in the care plan section)  Progress towards OT goals: Progressing toward goals  Acute Rehab OT Goals Patient Stated Goal: Regain IND  Plan      Co-evaluation                 End of Session    OT Visit Diagnosis: Pain Pain - Right/Left: Left Pain - part of body: Hip   Activity Tolerance Patient tolerated treatment well   Patient Left in chair;with call bell/phone within reach   Nurse Communication          Time: 3825-0539 OT Time Calculation (min): 26 min  Charges: OT General Charges $OT Visit: 1 Procedure OT Treatments $Self Care/Home Management : 23-37 mins  Lesle Chris, OTR/L [phone] 06/26/2016   [name] [name] 06/26/2016, 10:05 AM

## 2016-06-26 NOTE — Progress Notes (Signed)
RN reviewed discharge instructions with patient. All questions answered.   Paperwork and prescriptions given.   NT rolled patient down with all belongings to family car.  

## 2016-06-26 NOTE — Progress Notes (Signed)
   Subjective: 2 Days Post-Op Procedure(s) (LRB): LEFT TOTAL HIP ARTHROPLASTY ANTERIOR APPROACH (Left) Patient reports pain as mild.   Patient seen in rounds by Dr. Wynelle Link. Patient is well, but has had some minor complaints of pain in the hip, requiring pain medications Patient is ready to go home today following therapy.  Objective: Vital signs in last 24 hours: Temp:  [97.5 F (36.4 C)-98.3 F (36.8 C)] 98.2 F (36.8 C) (04/27 0558) Pulse Rate:  [60-78] 78 (04/27 0558) Resp:  [16-18] 18 (04/27 0558) BP: (123-158)/(67-89) 158/89 (04/27 0558) SpO2:  [98 %-100 %] 100 % (04/27 0558)  Intake/Output from previous day:  Intake/Output Summary (Last 24 hours) at 06/26/16 0726 Last data filed at 06/26/16 0647  Gross per 24 hour  Intake             1140 ml  Output             1925 ml  Net             -785 ml    Intake/Output this shift: No intake/output data recorded.  Labs:  Recent Labs  06/25/16 0513 06/26/16 0510  HGB 12.7* 12.6*    Recent Labs  06/25/16 0513 06/26/16 0510  WBC 16.6* 13.6*  RBC 4.09* 3.95*  HCT 36.4* 36.2*  PLT 217 228    Recent Labs  06/25/16 0513 06/26/16 0510  NA 133* 136  K 4.2 3.9  CL 103 102  CO2 23 26  BUN 16 22*  CREATININE 1.33* 1.36*  GLUCOSE 146* 128*  CALCIUM 8.7* 8.9   No results for input(s): LABPT, INR in the last 72 hours.  EXAM: General - Patient is Alert, Appropriate and Oriented Extremity - Neurovascular intact Sensation intact distally Intact pulses distally Dorsiflexion/Plantar flexion intact Incision - clean, dry, no drainage Motor Function - intact, moving foot and toes well on exam.   Assessment/Plan: 2 Days Post-Op Procedure(s) (LRB): LEFT TOTAL HIP ARTHROPLASTY ANTERIOR APPROACH (Left) Procedure(s) (LRB): LEFT TOTAL HIP ARTHROPLASTY ANTERIOR APPROACH (Left) Past Medical History:  Diagnosis Date  . Arthritis   . ED (erectile dysfunction)   . Enlarged prostate with lower urinary tract symptoms  (LUTS)   . GERD (gastroesophageal reflux disease)   . History of chronic gastritis   . History of colon polyps    hyperplastic 2006  . History of esophageal stricture    S/P  DILATATION 2009; 2010; 2010 2012  . History of kidney stones    hx. multiple kidney stones  . History of kidney stones   . History of primary hyperparathyroidism    s/p  left superior parathyroidectomy 07-10-2014  . Hyperlipidemia   . Hypertension   . Left ureteral stone   . Nephrolithiasis    left side   . Pre-diabetes    Principal Problem:   OA (osteoarthritis) of hip  Estimated body mass index is 35.84 kg/m as calculated from the following:   Height as of this encounter: 5\' 11"  (1.803 m).   Weight as of this encounter: 116.6 kg (257 lb). Up with therapy Discharge home with home health Diet - Cardiac diet Follow up - in 2 weeks Activity - WBAT Disposition - Home Condition Upon Discharge - Good D/C Meds - See DC Summary DVT Prophylaxis - Xarelto  Arlee Muslim, PA-C Orthopaedic Surgery 06/26/2016, 7:26 AM

## 2016-06-26 NOTE — Progress Notes (Signed)
Physical Therapy Treatment Patient Details Name: [name] [name] MRN: [id number] DOB: [birth year] Today's Date: 06/26/2016    History of Present Illness Pt s/p L THR and with hx of lung lobectomy and fainting     PT Comments    POD # 2 Assisted with amb a great distance in [place], practice stairs then performed all THR TE's following HEP handout.  Instructed on proper tech, freq as well as use of ICE.   Follow Up Recommendations  Home health PT     Equipment Recommendations  None recommended by PT    Recommendations for Other Services       Precautions / Restrictions Precautions Precautions: Fall Restrictions Weight Bearing Restrictions: No Other Position/Activity Restrictions: WBAT    Mobility  Bed Mobility               General bed mobility comments: OOB in recliner  Transfers Overall transfer level: Needs assistance Equipment used: Rolling walker (2 wheeled) Transfers: Sit to/from Stand Sit to Stand: Supervision         General transfer comment: good use of hands and one VC safety with turns  Ambulation/Gait Ambulation/Gait assistance: Supervision;Min guard Ambulation Distance (Feet): 84 Feet Assistive device: Rolling walker (2 wheeled) Gait Pattern/deviations: Step-to pattern;Decreased step length - right;Decreased step length - left;Shuffle;Trunk flexed Gait velocity: decr   General Gait Details: < 25% cues for sequence, posture and position from RW   Stairs Stairs: Yes   Stair Management: One rail Left;With cane;Step to pattern;Forwards Number of Stairs: 4 General stair comments: 25% VC's on proper tech and safety  Wheelchair Mobility    Modified Rankin (Stroke Patients Only)       Balance                                            Cognition Arousal/Alertness: Awake/alert Behavior During Therapy: WFL for tasks assessed/performed Overall Cognitive Status: Within Functional Limits for tasks assessed                                         Exercises   Total Hip Replacement TE's 10 reps ankle pumps 10 reps knee presses 10 reps heel slides 10 reps SAQ's 10 reps ABD Followed by ICE     General Comments        Pertinent Vitals/Pain Pain Assessment: 0-10 Pain Score: 4  Pain Descriptors / Indicators: Aching;Sore;Operative site guarding Pain Intervention(s): Monitored during session;Repositioned;Ice applied    Home Living                      Prior Function            PT Goals (current goals can now be found in the care plan section) Progress towards PT goals: Progressing toward goals    Frequency    7X/week      PT Plan Current plan remains appropriate    Co-evaluation             End of Session Equipment Utilized During Treatment: Gait belt Activity Tolerance: Patient tolerated treatment well Patient left: in chair;with call bell/phone within reach Nurse Communication: Mobility status (pt ready for D/C to home) PT Visit Diagnosis: Unsteadiness on feet (R26.81);Difficulty in walking, not elsewhere classified (R26.2)     Time:  3779-3968 PT Time Calculation (min) (ACUTE ONLY): 29 min  Charges:  $Gait Training: 8-22 mins $Therapeutic Activity: 8-22 mins                    G Codes:       Rica Koyanagi  PTA [facility]  Acute  Rehab Pager      [phone]

## 2016-06-27 DIAGNOSIS — Z96642 Presence of left artificial hip joint: Secondary | ICD-10-CM | POA: Diagnosis not present

## 2016-06-27 DIAGNOSIS — Z7901 Long term (current) use of anticoagulants: Secondary | ICD-10-CM | POA: Diagnosis not present

## 2016-06-27 DIAGNOSIS — Z87891 Personal history of nicotine dependence: Secondary | ICD-10-CM | POA: Diagnosis not present

## 2016-06-27 DIAGNOSIS — I129 Hypertensive chronic kidney disease with stage 1 through stage 4 chronic kidney disease, or unspecified chronic kidney disease: Secondary | ICD-10-CM | POA: Diagnosis not present

## 2016-06-27 DIAGNOSIS — Z471 Aftercare following joint replacement surgery: Secondary | ICD-10-CM | POA: Diagnosis not present

## 2016-06-27 DIAGNOSIS — R7303 Prediabetes: Secondary | ICD-10-CM | POA: Diagnosis not present

## 2016-06-27 DIAGNOSIS — N183 Chronic kidney disease, stage 3 (moderate): Secondary | ICD-10-CM | POA: Diagnosis not present

## 2016-06-27 DIAGNOSIS — Z79891 Long term (current) use of opiate analgesic: Secondary | ICD-10-CM | POA: Diagnosis not present

## 2016-06-27 DIAGNOSIS — J45909 Unspecified asthma, uncomplicated: Secondary | ICD-10-CM | POA: Diagnosis not present

## 2016-06-27 DIAGNOSIS — Z87442 Personal history of urinary calculi: Secondary | ICD-10-CM | POA: Diagnosis not present

## 2016-06-29 DIAGNOSIS — Z471 Aftercare following joint replacement surgery: Secondary | ICD-10-CM | POA: Diagnosis not present

## 2016-06-29 DIAGNOSIS — J45909 Unspecified asthma, uncomplicated: Secondary | ICD-10-CM | POA: Diagnosis not present

## 2016-06-29 DIAGNOSIS — N183 Chronic kidney disease, stage 3 (moderate): Secondary | ICD-10-CM | POA: Diagnosis not present

## 2016-06-29 DIAGNOSIS — Z79891 Long term (current) use of opiate analgesic: Secondary | ICD-10-CM | POA: Diagnosis not present

## 2016-06-29 DIAGNOSIS — I129 Hypertensive chronic kidney disease with stage 1 through stage 4 chronic kidney disease, or unspecified chronic kidney disease: Secondary | ICD-10-CM | POA: Diagnosis not present

## 2016-06-29 DIAGNOSIS — Z96642 Presence of left artificial hip joint: Secondary | ICD-10-CM | POA: Diagnosis not present

## 2016-06-29 DIAGNOSIS — Z7901 Long term (current) use of anticoagulants: Secondary | ICD-10-CM | POA: Diagnosis not present

## 2016-06-29 DIAGNOSIS — Z87442 Personal history of urinary calculi: Secondary | ICD-10-CM | POA: Diagnosis not present

## 2016-06-29 DIAGNOSIS — R7303 Prediabetes: Secondary | ICD-10-CM | POA: Diagnosis not present

## 2016-06-29 DIAGNOSIS — Z87891 Personal history of nicotine dependence: Secondary | ICD-10-CM | POA: Diagnosis not present

## 2016-07-01 DIAGNOSIS — Z87891 Personal history of nicotine dependence: Secondary | ICD-10-CM | POA: Diagnosis not present

## 2016-07-01 DIAGNOSIS — Z471 Aftercare following joint replacement surgery: Secondary | ICD-10-CM | POA: Diagnosis not present

## 2016-07-01 DIAGNOSIS — R7303 Prediabetes: Secondary | ICD-10-CM | POA: Diagnosis not present

## 2016-07-01 DIAGNOSIS — Z87442 Personal history of urinary calculi: Secondary | ICD-10-CM | POA: Diagnosis not present

## 2016-07-01 DIAGNOSIS — J45909 Unspecified asthma, uncomplicated: Secondary | ICD-10-CM | POA: Diagnosis not present

## 2016-07-01 DIAGNOSIS — Z96642 Presence of left artificial hip joint: Secondary | ICD-10-CM | POA: Diagnosis not present

## 2016-07-01 DIAGNOSIS — Z7901 Long term (current) use of anticoagulants: Secondary | ICD-10-CM | POA: Diagnosis not present

## 2016-07-01 DIAGNOSIS — Z79891 Long term (current) use of opiate analgesic: Secondary | ICD-10-CM | POA: Diagnosis not present

## 2016-07-01 DIAGNOSIS — N183 Chronic kidney disease, stage 3 (moderate): Secondary | ICD-10-CM | POA: Diagnosis not present

## 2016-07-01 DIAGNOSIS — I129 Hypertensive chronic kidney disease with stage 1 through stage 4 chronic kidney disease, or unspecified chronic kidney disease: Secondary | ICD-10-CM | POA: Diagnosis not present

## 2016-07-03 DIAGNOSIS — Z87442 Personal history of urinary calculi: Secondary | ICD-10-CM | POA: Diagnosis not present

## 2016-07-03 DIAGNOSIS — R7303 Prediabetes: Secondary | ICD-10-CM | POA: Diagnosis not present

## 2016-07-03 DIAGNOSIS — Z471 Aftercare following joint replacement surgery: Secondary | ICD-10-CM | POA: Diagnosis not present

## 2016-07-03 DIAGNOSIS — Z87891 Personal history of nicotine dependence: Secondary | ICD-10-CM | POA: Diagnosis not present

## 2016-07-03 DIAGNOSIS — Z79891 Long term (current) use of opiate analgesic: Secondary | ICD-10-CM | POA: Diagnosis not present

## 2016-07-03 DIAGNOSIS — Z96642 Presence of left artificial hip joint: Secondary | ICD-10-CM | POA: Diagnosis not present

## 2016-07-03 DIAGNOSIS — J45909 Unspecified asthma, uncomplicated: Secondary | ICD-10-CM | POA: Diagnosis not present

## 2016-07-03 DIAGNOSIS — I129 Hypertensive chronic kidney disease with stage 1 through stage 4 chronic kidney disease, or unspecified chronic kidney disease: Secondary | ICD-10-CM | POA: Diagnosis not present

## 2016-07-03 DIAGNOSIS — Z7901 Long term (current) use of anticoagulants: Secondary | ICD-10-CM | POA: Diagnosis not present

## 2016-07-03 DIAGNOSIS — N183 Chronic kidney disease, stage 3 (moderate): Secondary | ICD-10-CM | POA: Diagnosis not present

## 2016-07-06 DIAGNOSIS — Z7901 Long term (current) use of anticoagulants: Secondary | ICD-10-CM | POA: Diagnosis not present

## 2016-07-06 DIAGNOSIS — J45909 Unspecified asthma, uncomplicated: Secondary | ICD-10-CM | POA: Diagnosis not present

## 2016-07-06 DIAGNOSIS — Z96642 Presence of left artificial hip joint: Secondary | ICD-10-CM | POA: Diagnosis not present

## 2016-07-06 DIAGNOSIS — Z87891 Personal history of nicotine dependence: Secondary | ICD-10-CM | POA: Diagnosis not present

## 2016-07-06 DIAGNOSIS — Z79891 Long term (current) use of opiate analgesic: Secondary | ICD-10-CM | POA: Diagnosis not present

## 2016-07-06 DIAGNOSIS — R7303 Prediabetes: Secondary | ICD-10-CM | POA: Diagnosis not present

## 2016-07-06 DIAGNOSIS — I129 Hypertensive chronic kidney disease with stage 1 through stage 4 chronic kidney disease, or unspecified chronic kidney disease: Secondary | ICD-10-CM | POA: Diagnosis not present

## 2016-07-06 DIAGNOSIS — Z87442 Personal history of urinary calculi: Secondary | ICD-10-CM | POA: Diagnosis not present

## 2016-07-06 DIAGNOSIS — Z471 Aftercare following joint replacement surgery: Secondary | ICD-10-CM | POA: Diagnosis not present

## 2016-07-06 DIAGNOSIS — N183 Chronic kidney disease, stage 3 (moderate): Secondary | ICD-10-CM | POA: Diagnosis not present

## 2016-07-08 DIAGNOSIS — Z96642 Presence of left artificial hip joint: Secondary | ICD-10-CM | POA: Diagnosis not present

## 2016-07-08 DIAGNOSIS — Z87891 Personal history of nicotine dependence: Secondary | ICD-10-CM | POA: Diagnosis not present

## 2016-07-08 DIAGNOSIS — Z79891 Long term (current) use of opiate analgesic: Secondary | ICD-10-CM | POA: Diagnosis not present

## 2016-07-08 DIAGNOSIS — N183 Chronic kidney disease, stage 3 (moderate): Secondary | ICD-10-CM | POA: Diagnosis not present

## 2016-07-08 DIAGNOSIS — Z7901 Long term (current) use of anticoagulants: Secondary | ICD-10-CM | POA: Diagnosis not present

## 2016-07-08 DIAGNOSIS — R7303 Prediabetes: Secondary | ICD-10-CM | POA: Diagnosis not present

## 2016-07-08 DIAGNOSIS — I129 Hypertensive chronic kidney disease with stage 1 through stage 4 chronic kidney disease, or unspecified chronic kidney disease: Secondary | ICD-10-CM | POA: Diagnosis not present

## 2016-07-08 DIAGNOSIS — J45909 Unspecified asthma, uncomplicated: Secondary | ICD-10-CM | POA: Diagnosis not present

## 2016-07-08 DIAGNOSIS — Z87442 Personal history of urinary calculi: Secondary | ICD-10-CM | POA: Diagnosis not present

## 2016-07-08 DIAGNOSIS — Z471 Aftercare following joint replacement surgery: Secondary | ICD-10-CM | POA: Diagnosis not present

## 2016-07-10 DIAGNOSIS — N183 Chronic kidney disease, stage 3 (moderate): Secondary | ICD-10-CM | POA: Diagnosis not present

## 2016-07-10 DIAGNOSIS — Z87442 Personal history of urinary calculi: Secondary | ICD-10-CM | POA: Diagnosis not present

## 2016-07-10 DIAGNOSIS — R7303 Prediabetes: Secondary | ICD-10-CM | POA: Diagnosis not present

## 2016-07-10 DIAGNOSIS — I129 Hypertensive chronic kidney disease with stage 1 through stage 4 chronic kidney disease, or unspecified chronic kidney disease: Secondary | ICD-10-CM | POA: Diagnosis not present

## 2016-07-10 DIAGNOSIS — Z96642 Presence of left artificial hip joint: Secondary | ICD-10-CM | POA: Diagnosis not present

## 2016-07-10 DIAGNOSIS — Z471 Aftercare following joint replacement surgery: Secondary | ICD-10-CM | POA: Diagnosis not present

## 2016-07-10 DIAGNOSIS — Z7901 Long term (current) use of anticoagulants: Secondary | ICD-10-CM | POA: Diagnosis not present

## 2016-07-10 DIAGNOSIS — J45909 Unspecified asthma, uncomplicated: Secondary | ICD-10-CM | POA: Diagnosis not present

## 2016-07-10 DIAGNOSIS — Z87891 Personal history of nicotine dependence: Secondary | ICD-10-CM | POA: Diagnosis not present

## 2016-07-10 DIAGNOSIS — Z79891 Long term (current) use of opiate analgesic: Secondary | ICD-10-CM | POA: Diagnosis not present

## 2016-07-13 DIAGNOSIS — J45909 Unspecified asthma, uncomplicated: Secondary | ICD-10-CM | POA: Diagnosis not present

## 2016-07-13 DIAGNOSIS — Z87442 Personal history of urinary calculi: Secondary | ICD-10-CM | POA: Diagnosis not present

## 2016-07-13 DIAGNOSIS — I129 Hypertensive chronic kidney disease with stage 1 through stage 4 chronic kidney disease, or unspecified chronic kidney disease: Secondary | ICD-10-CM | POA: Diagnosis not present

## 2016-07-13 DIAGNOSIS — Z7901 Long term (current) use of anticoagulants: Secondary | ICD-10-CM | POA: Diagnosis not present

## 2016-07-13 DIAGNOSIS — R7303 Prediabetes: Secondary | ICD-10-CM | POA: Diagnosis not present

## 2016-07-13 DIAGNOSIS — N183 Chronic kidney disease, stage 3 (moderate): Secondary | ICD-10-CM | POA: Diagnosis not present

## 2016-07-13 DIAGNOSIS — Z79891 Long term (current) use of opiate analgesic: Secondary | ICD-10-CM | POA: Diagnosis not present

## 2016-07-13 DIAGNOSIS — Z87891 Personal history of nicotine dependence: Secondary | ICD-10-CM | POA: Diagnosis not present

## 2016-07-13 DIAGNOSIS — Z471 Aftercare following joint replacement surgery: Secondary | ICD-10-CM | POA: Diagnosis not present

## 2016-07-13 DIAGNOSIS — Z96642 Presence of left artificial hip joint: Secondary | ICD-10-CM | POA: Diagnosis not present

## 2016-07-14 DIAGNOSIS — R339 Retention of urine, unspecified: Secondary | ICD-10-CM | POA: Diagnosis not present

## 2016-07-15 DIAGNOSIS — Z96642 Presence of left artificial hip joint: Secondary | ICD-10-CM | POA: Diagnosis not present

## 2016-07-15 DIAGNOSIS — R7303 Prediabetes: Secondary | ICD-10-CM | POA: Diagnosis not present

## 2016-07-15 DIAGNOSIS — Z87891 Personal history of nicotine dependence: Secondary | ICD-10-CM | POA: Diagnosis not present

## 2016-07-15 DIAGNOSIS — Z87442 Personal history of urinary calculi: Secondary | ICD-10-CM | POA: Diagnosis not present

## 2016-07-15 DIAGNOSIS — Z471 Aftercare following joint replacement surgery: Secondary | ICD-10-CM | POA: Diagnosis not present

## 2016-07-15 DIAGNOSIS — Z7901 Long term (current) use of anticoagulants: Secondary | ICD-10-CM | POA: Diagnosis not present

## 2016-07-15 DIAGNOSIS — Z79891 Long term (current) use of opiate analgesic: Secondary | ICD-10-CM | POA: Diagnosis not present

## 2016-07-15 DIAGNOSIS — J45909 Unspecified asthma, uncomplicated: Secondary | ICD-10-CM | POA: Diagnosis not present

## 2016-07-15 DIAGNOSIS — N183 Chronic kidney disease, stage 3 (moderate): Secondary | ICD-10-CM | POA: Diagnosis not present

## 2016-07-15 DIAGNOSIS — I129 Hypertensive chronic kidney disease with stage 1 through stage 4 chronic kidney disease, or unspecified chronic kidney disease: Secondary | ICD-10-CM | POA: Diagnosis not present

## 2016-07-17 ENCOUNTER — Other Ambulatory Visit: Payer: Self-pay | Admitting: Internal Medicine

## 2016-07-17 DIAGNOSIS — K649 Unspecified hemorrhoids: Secondary | ICD-10-CM

## 2016-07-17 MED ORDER — HYDROCORTISONE ACETATE 25 MG RE SUPP: 25.0000 mg | Freq: Two times a day (BID) | RECTAL | 1 refills | Status: DC

## 2016-07-17 MED ORDER — HYDROCORTISONE 2.5 % RE CREA: TOPICAL_CREAM | RECTAL | 1 refills | Status: DC

## 2016-07-23 DIAGNOSIS — R3914 Feeling of incomplete bladder emptying: Secondary | ICD-10-CM | POA: Diagnosis not present

## 2016-07-23 DIAGNOSIS — R300 Dysuria: Secondary | ICD-10-CM | POA: Diagnosis not present

## 2016-07-24 DIAGNOSIS — R339 Retention of urine, unspecified: Secondary | ICD-10-CM | POA: Diagnosis not present

## 2016-07-28 ENCOUNTER — Encounter: Payer: Self-pay | Admitting: Internal Medicine

## 2016-07-28 DIAGNOSIS — M1612 Unilateral primary osteoarthritis, left hip: Secondary | ICD-10-CM | POA: Diagnosis not present

## 2016-07-29 DIAGNOSIS — N401 Enlarged prostate with lower urinary tract symptoms: Secondary | ICD-10-CM | POA: Diagnosis not present

## 2016-07-30 DIAGNOSIS — R300 Dysuria: Secondary | ICD-10-CM | POA: Diagnosis not present

## 2016-08-04 ENCOUNTER — Encounter: Payer: Self-pay | Admitting: Internal Medicine

## 2016-08-12 DIAGNOSIS — N201 Calculus of ureter: Secondary | ICD-10-CM | POA: Diagnosis not present

## 2016-08-27 ENCOUNTER — Other Ambulatory Visit: Payer: Self-pay | Admitting: *Deleted

## 2016-08-27 ENCOUNTER — Telehealth: Payer: Self-pay | Admitting: *Deleted

## 2016-08-27 DIAGNOSIS — K649 Unspecified hemorrhoids: Secondary | ICD-10-CM

## 2016-08-27 MED ORDER — HYDROCORTISONE 2.5 % RE CREA: TOPICAL_CREAM | RECTAL | 1 refills | Status: DC

## 2016-08-27 NOTE — Telephone Encounter (Signed)
Patient called and states his hemorrhoids are improved but not gone.  Per Dr Melford Aase, a refill for Anusol HC cream was sent to [facility] in [place], [place].  The patient will call back if problem persist.

## 2016-09-08 ENCOUNTER — Encounter: Payer: Self-pay | Admitting: Internal Medicine

## 2016-09-08 DIAGNOSIS — M1612 Unilateral primary osteoarthritis, left hip: Secondary | ICD-10-CM | POA: Diagnosis not present

## 2016-09-15 DIAGNOSIS — D3132 Benign neoplasm of left choroid: Secondary | ICD-10-CM | POA: Diagnosis not present

## 2016-09-15 DIAGNOSIS — H2513 Age-related nuclear cataract, bilateral: Secondary | ICD-10-CM | POA: Diagnosis not present

## 2016-09-18 DIAGNOSIS — M25552 Pain in left hip: Secondary | ICD-10-CM | POA: Diagnosis not present

## 2016-09-21 DIAGNOSIS — M25552 Pain in left hip: Secondary | ICD-10-CM | POA: Diagnosis not present

## 2016-09-24 DIAGNOSIS — M25552 Pain in left hip: Secondary | ICD-10-CM | POA: Diagnosis not present

## 2016-09-28 ENCOUNTER — Encounter: Payer: Self-pay | Admitting: Internal Medicine

## 2016-09-28 ENCOUNTER — Ambulatory Visit (INDEPENDENT_AMBULATORY_CARE_PROVIDER_SITE_OTHER): Payer: Medicare Other | Admitting: Internal Medicine

## 2016-09-28 VITALS — BP 136/82 | HR 52 | Temp 97.5°F | Resp 16 | Ht 71.25 in | Wt 250.8 lb

## 2016-09-28 DIAGNOSIS — R3914 Feeling of incomplete bladder emptying: Secondary | ICD-10-CM | POA: Diagnosis not present

## 2016-09-28 DIAGNOSIS — Z1212 Encounter for screening for malignant neoplasm of rectum: Secondary | ICD-10-CM

## 2016-09-28 DIAGNOSIS — R7303 Prediabetes: Secondary | ICD-10-CM

## 2016-09-28 DIAGNOSIS — Z0001 Encounter for general adult medical examination with abnormal findings: Secondary | ICD-10-CM

## 2016-09-28 DIAGNOSIS — R1312 Dysphagia, oropharyngeal phase: Secondary | ICD-10-CM

## 2016-09-28 DIAGNOSIS — Z0000 Encounter for general adult medical examination without abnormal findings: Secondary | ICD-10-CM | POA: Diagnosis not present

## 2016-09-28 DIAGNOSIS — E559 Vitamin D deficiency, unspecified: Secondary | ICD-10-CM

## 2016-09-28 DIAGNOSIS — I10 Essential (primary) hypertension: Secondary | ICD-10-CM | POA: Diagnosis not present

## 2016-09-28 DIAGNOSIS — Z87442 Personal history of urinary calculi: Secondary | ICD-10-CM | POA: Diagnosis not present

## 2016-09-28 DIAGNOSIS — Z79899 Other long term (current) drug therapy: Secondary | ICD-10-CM

## 2016-09-28 DIAGNOSIS — K219 Gastro-esophageal reflux disease without esophagitis: Secondary | ICD-10-CM

## 2016-09-28 DIAGNOSIS — Z1211 Encounter for screening for malignant neoplasm of colon: Secondary | ICD-10-CM | POA: Diagnosis not present

## 2016-09-28 DIAGNOSIS — Z125 Encounter for screening for malignant neoplasm of prostate: Secondary | ICD-10-CM | POA: Diagnosis not present

## 2016-09-28 DIAGNOSIS — E782 Mixed hyperlipidemia: Secondary | ICD-10-CM

## 2016-09-28 DIAGNOSIS — Z136 Encounter for screening for cardiovascular disorders: Secondary | ICD-10-CM

## 2016-09-28 DIAGNOSIS — N401 Enlarged prostate with lower urinary tract symptoms: Secondary | ICD-10-CM | POA: Diagnosis not present

## 2016-09-28 DIAGNOSIS — M25552 Pain in left hip: Secondary | ICD-10-CM | POA: Diagnosis not present

## 2016-09-28 LAB — CBC WITH DIFFERENTIAL/PLATELET
Basophils Absolute: 0 cells/uL (ref 0–200)
Basophils Relative: 0 %
Eosinophils Absolute: 350 cells/uL (ref 15–500)
Eosinophils Relative: 5 %
HCT: 42.5 % (ref 38.5–50.0)
Hemoglobin: 14.3 g/dL (ref 13.2–17.1)
Lymphocytes Relative: 29 %
Lymphs Abs: 2030 cells/uL (ref 850–3900)
MCH: 30.5 pg (ref 27.0–33.0)
MCHC: 33.6 g/dL (ref 32.0–36.0)
MCV: 90.6 fL (ref 80.0–100.0)
MPV: 10.3 fL (ref 7.5–12.5)
Monocytes Absolute: 700 cells/uL (ref 200–950)
Monocytes Relative: 10 %
Neutro Abs: 3920 cells/uL (ref 1500–7800)
Neutrophils Relative %: 56 %
Platelets: 280 10*3/uL (ref 140–400)
RBC: 4.69 MIL/uL (ref 4.20–5.80)
RDW: 14.6 % (ref 11.0–15.0)
WBC: 7 10*3/uL (ref 3.8–10.8)

## 2016-09-28 NOTE — Progress Notes (Signed)
[facility]   Unk Pinto, M.D.      Uvaldo Bristle. Silverio Lay, P.A.-C [facility]                [address] [place] [address] Telephone [phone] Telefax [phone] Annual  Screening/Preventative Visit  & Comprehensive Evaluation & Examination     This very nice 76 y.o. MWM presents for a Screening/Preventative Visit & comprehensive evaluation and management of multiple medical co-morbidities.  Patient has been followed for HTN, Prediabetes, Hyperlipidemia and Vitamin D Deficiency. In May 2016, he underwent a L Superior Parathyroidectomy and also  lithotripsy for recurrent kidney stones.     Patient has hx/o GERD and last EGD w/Dilation was in 2012 and he's more recently been having oropharyngeal and proximal esophageal dysphagia.  And he's overdue for  Colonoscopy - last was was in 2006.     Patient recently underwent Lt Hip arthroplasty in April 2018 by Dr Wynelle Link.      HTN predates since 2002. Patient's BP has been controlled at home.  Today's BP is at goal - 136/82. Patient denies any cardiac symptoms as chest pain, palpitations, shortness of breath, dizziness or ankle swelling.     Patient's hyperlipidemia is controlled with diet and medications. Patient denies myalgias or other medication SE's. Last lipids were at goal: Lab Results  Component Value Date   CHOL 171 04/09/2016   HDL 41 04/09/2016   LDLCALC 99 04/09/2016   TRIG 156 (H) 04/09/2016   CHOLHDL 4.2 04/09/2016      Patient has prediabetes/Insulin Resistance  (A1c 6.4% /Insulin 124 in 2010) and patient denies reactive hypoglycemic symptoms, visual blurring, diabetic polys or paresthesias. Last A1c was near goal: Lab Results  Component Value Date   HGBA1C 5.9 (H) 06/02/2016       Finally, patient has history of Vitamin D Deficiency  ("36" in 2009)  and last vitamin D was at goal: Lab Results  Component Value Date   VD25OH  89 04/09/2016   Current Outpatient Prescriptions on File Prior to Visit  Medication Sig  . bisoprolol-hctz 5-6.25 MG tablet TAKE 1 TABEVERY MORNING   . fenofibrate (145 MG tablet TAKE 1 TAB DAILY  . ANUSOL-HC 2.5 % rectal crm Apply rectally 2 times daily  . ANUSOL-HC 25 MG supp Place 1 supp rectally every 12  hours.  Marland Kitchen losartan (100 MG tablet TAKE 1 TAB DAILY FOR BLOOD PRESSURE.  . magnesium  400 MG tablet Take 400 mg by mouth 2 (two) times daily.   . methocarbamol500 MG tablet Take 1 tab every 6  hrs as needed for muscle spasms.  Marland Kitchen omeprazole 40 MG capsule TAKE 1 CAPSULE BY MOUTH DAILY.  Marland Kitchen oxybutynin  5 MG tablet Take 1 tab every 8 hrs as needed for bladder spasms.  Marland Kitchen oxyCODONE  5 MG  IR Take 1-2 tab every 4  hrs as needed  . tamsulosin  0.4 MG CAPS capsule Take 0.4 mg by mouth daily after breakfast.   . traMADol (ULTRAM) 50 MG tablet Take 1-2 tab every 6 (six) hours as needed    Allergies  Allergen Reactions  . Iohexol Shortness Of Breath  . Ace Inhibitors Cough   Past Medical History:  Diagnosis Date  . Arthritis   . ED (erectile dysfunction)   . Enlarged prostate with lower urinary tract symptoms (LUTS)   .  GERD (gastroesophageal reflux disease)   . History of chronic gastritis   . History of colon polyps    hyperplastic 2006  . History of esophageal stricture    S/P  DILATATION 2009; 2010; 2010 2012  . History of kidney stones    hx. multiple kidney stones  . History of kidney stones   . History of primary hyperparathyroidism    s/p  left superior parathyroidectomy 07-10-2014  . Hyperlipidemia   . Hypertension   . Left ureteral stone   . Nephrolithiasis    left side   . Pre-diabetes    Health Maintenance  Topic Date Due  . INFLUENZA VACCINE  09/30/2016  . COLONOSCOPY  12/15/2016  . TETANUS/TDAP  09/23/2025  . PNA vac Low Risk Adult  Completed   Immunization History  Administered Date(s) Administered  . Influenza Split 12/27/2012  . Influenza, High Dose  Seasonal PF 01/31/2014, 11/27/2014  . Pneumococcal Conjugate-13 03/25/2015  . Pneumococcal Polysaccharide-23 03/02/2008  . Td 03/02/2005, 09/24/2015  . Zoster 03/02/2006   Past Surgical History:  Procedure Laterality Date  . COLONOSCOPY  02/17/2005  . CYSTO/  LEFT RETROGRADE PYELOGRAM/ STENT PLACEMENT  01/26/2005  . CYSTOSCOPY W/ URETEROSCOPY W/ LITHOTRIPSY Left 05/04/2005  . CYSTOSCOPY WITH STENT PLACEMENT Left 12/30/2015   Procedure: CYSTOSCOPY WITH STENT PLACEMENT;  Surgeon: Franchot Gallo, MD;  Location[facility];  Service: Urology;  Laterality: Left;  . CYSTOSCOPY/RETROGRADE/URETEROSCOPY/STONE EXTRACTION WITH BASKET Left 12/30/2015   Procedure: CYSTOSCOPY/RETROGRADE/URETEROSCOPY/STONE EXTRACTION WITH BASKET;  Surgeon: Franchot Gallo, MD;  Location: [facility];  Service: Urology;  Laterality: Left;  . CYSTOSCOPY/URETEROSCOPY/HOLMIUM LASER/STENT PLACEMENT Left 03/09/2016   Procedure: CYSTOSCOPY/RETROGRADE PYELOGRAM/URETEROSCOPY/BASKET STONE EXTRACTION/STENT PLACEMENT;  Surgeon: Franchot Gallo, MD;  Location: [facility];  Service: Urology;  Laterality: Left;  . ESOPHAGOGASTRODUODENOSCOPY (EGD) WITH ESOPHAGEAL DILATION  x4  last one 07-21-2010  . HOLMIUM LASER APPLICATION Left 09/62/8366   Procedure: HOLMIUM LASER APPLICATION;  Surgeon: Franchot Gallo, MD;  Location: [facility];  Service: Urology;  Laterality: Left;  . INGUINAL HERNIA REPAIR Left yrs ago  . NEPHROLITHOTOMY Left 02/01/2014   Procedure: NEPHROLITHOTOMY PERCUTANEOUS;  Surgeon: Jorja Loa, MD;  Location: [facility];  Service: Urology;  Laterality: Left;  . PARATHYROIDECTOMY Left 07/10/2014   Procedure: LEFT SUPERIOR PARATHYROIDECTOMY;  Surgeon: Armandina Gemma, MD;  Location: [facility];  Service: General;  Laterality: Left;  . TOTAL HIP ARTHROPLASTY Left 06/24/2016   Procedure: LEFT TOTAL HIP ARTHROPLASTY ANTERIOR APPROACH;  Surgeon: Gaynelle Arabian, MD;  Location: [facility];   Service: Orthopedics;  Laterality: Left;  . tumor ear Left age 60    topical growth behind left ear.   No family history on file. Social History   Social History  . Marital status: Married    Spouse name: N/A  . Number of children: N/A  . Years of education: N/A   Occupational History   Semi-retired Publishing rights manager   Social History Main Topics  . Smoking status: Former Smoker    Years: 8.00    Quit date: 03/02/1974  . Smokeless tobacco: Never Used  . Alcohol use 0.6 - 1.2 oz/week    1 - 2 Glasses of wine per week     Comment: q afternoon  . Drug use: No  . Sexual activity: Yes    ROS Constitutional: Denies fever, chills, weight loss/gain, headaches, insomnia,  night sweats or change in appetite. Does c/o fatigue. Eyes: Denies redness, blurred vision, diplopia, discharge, itchy or watery eyes.  ENT: Denies  discharge, congestion, post nasal drip, epistaxis, sore throat, earache, hearing loss, dental pain, Tinnitus, Vertigo, Sinus pain or snoring.  Cardio: Denies chest pain, palpitations, irregular heartbeat, syncope, dyspnea, diaphoresis, orthopnea, PND, claudication or edema Respiratory: denies cough, dyspnea, DOE, pleurisy, hoarseness, laryngitis or wheezing.  Gastrointestinal: Denies dysphagia, heartburn, reflux, water brash, pain, cramps, nausea, vomiting, bloating, diarrhea, constipation, hematemesis, melena, hematochezia, jaundice or hemorrhoids Genitourinary: Denies dysuria, frequency, urgency, nocturia, hesitancy, discharge, hematuria or flank pain Musculoskeletal: Denies arthralgia, myalgia, stiffness, Jt. Swelling, pain, limp or strain/sprain. Denies Falls. Skin: Denies puritis, rash, hives, warts, acne, eczema or change in skin lesion Neuro: No weakness, tremor, incoordination, spasms, paresthesia or pain Psychiatric: Denies confusion, memory loss or sensory loss. Denies Depression. Endocrine: Denies change in weight, skin, hair change, nocturia, and paresthesia,  diabetic polys, visual blurring or hyper / hypo glycemic episodes.  Heme/Lymph: No excessive bleeding, bruising or enlarged lymph nodes.  Physical Exam  BP 136/82   Pulse (!) 52   Temp (!) 97.5 F (36.4 C)   Resp 16   Ht 5' 11.25" (1.81 m)   Wt 250 lb 12.8 oz (113.8 kg)   BMI 34.73 kg/m   General Appearance: Well nourished and well groomed and in no apparent distress.  Eyes: PERRLA, EOMs, conjunctiva no swelling or erythema, normal fundi and vessels. Sinuses: No frontal/maxillary tenderness ENT/Mouth: EACs patent / TMs  nl. Nares clear without erythema, swelling, mucoid exudates. Oral hygiene is good. No erythema, swelling, or exudate. Tongue normal, non-obstructing. Tonsils not swollen or erythematous. Hearing normal.  Neck: Supple, thyroid normal. No bruits, nodes or JVD. Respiratory: Respiratory effort normal.  BS equal and clear bilateral without rales, rhonci, wheezing or stridor. Cardio: Heart sounds are normal with regular rate and rhythm and no murmurs, rubs or gallops. Peripheral pulses are normal and equal bilaterally without edema. No aortic or femoral bruits. Chest: symmetric with normal excursions and percussion.  Abdomen: Soft, with Nl bowel sounds. Nontender, no guarding, rebound, hernias, masses, or organomegaly.  Lymphatics: Non tender without lymphadenopathy.  Genitourinary: No hernias.Testes nl. DRE - prostate nl for age - smooth & firm w/o nodules. Musculoskeletal: Full ROM all peripheral extremities, joint stability, 5/5 strength an sl Lt sided limp in his gait. Skin: Warm and dry without rashes, lesions, cyanosis, clubbing or  ecchymosis.  Neuro: Cranial nerves intact, reflexes equal bilaterally. Normal muscle tone, no cerebellar symptoms. Sensation intact.  Pysch: Alert and oriented X 3 with normal affect, insight and judgment appropriate.   Assessment and Plan  1. Annual Preventative/Screening Exam   2. Essential hypertension  - EKG 12-Lead - [place],  RETROPERITNL ABD,  LTD - Urinalysis, Routine w reflex microscopic - CBC with Differential/Platelet - BASIC METABOLIC PANEL WITH GFR - Magnesium - TSH - Microalbumin / creatinine urine ratio  3. Hyperlipidemia, mixed  - EKG 12-Lead - [place], RETROPERITNL ABD,  LTD - Hepatic function panel - Lipid panel - TSH  4. Prediabetes  - EKG 12-Lead - [place], RETROPERITNL ABD,  LTD - Hemoglobin A1c - Insulin, random  5. Vitamin D deficiency  - VITAMIN D 25 Hydroxy   6. Gastroesophageal reflux disease, esophagitis presence not specified  - Ambulatory referral to Gastroenterology  7. Oropharyngeal dysphagia  - Ambulatory referral to Gastroenterology  8. Screening for rectal cancer  - POC Hemoccult Bld/Stl  - Ambulatory referral to Gastroenterology  9. Prostate cancer screening  - PSA  10. Screening for ischemic heart disease  - EKG 12-Lead  11. Screening for AAA (aortic abdominal aneurysm)  -  [place], RETROPERITNL ABD,  LTD  12. Medication management  - Urinalysis, Routine w reflex microscopic - CBC with Differential/Platelet - BASIC METABOLIC PANEL WITH GFR - Hepatic function panel - Magnesium - Lipid panel - TSH - Hemoglobin A1c - Insulin, random - VITAMIN D 25 Hydroxy  - Microalbumin / creatinine urine ratio       Patient was counseled in prudent diet, weight control to achieve/maintain BMI less than 25, BP monitoring, regular exercise and medications as discussed.  Discussed med effects and SE's. Routine screening labs and tests as requested with regular follow-up as recommended. Over 40 minutes of exam, counseling, chart review and high complex critical decision making was performed

## 2016-09-28 NOTE — Patient Instructions (Signed)

## 2016-09-29 ENCOUNTER — Encounter: Payer: Self-pay | Admitting: Gastroenterology

## 2016-09-29 LAB — HEPATIC FUNCTION PANEL
ALT: 21 U/L (ref 9–46)
AST: 25 U/L (ref 10–35)
Albumin: 4.2 g/dL (ref 3.6–5.1)
Alkaline Phosphatase: 34 U/L — ABNORMAL LOW (ref 40–115)
Bilirubin, Direct: 0.1 mg/dL (ref <=0.2)
Indirect Bilirubin: 0.4 mg/dL (ref 0.2–1.2)
Total Bilirubin: 0.5 mg/dL (ref 0.2–1.2)
Total Protein: 6.7 g/dL (ref 6.1–8.1)

## 2016-09-29 LAB — URINALYSIS, ROUTINE W REFLEX MICROSCOPIC

## 2016-09-29 LAB — LIPID PANEL
Cholesterol: 188 mg/dL (ref <200)
HDL: 50 mg/dL (ref >40)
LDL Cholesterol: 104 mg/dL — ABNORMAL HIGH (ref <100)
Total CHOL/HDL Ratio: 3.8 Ratio (ref <5.0)
Triglycerides: 170 mg/dL — ABNORMAL HIGH (ref <150)
VLDL: 34 mg/dL — ABNORMAL HIGH (ref <30)

## 2016-09-29 LAB — INSULIN, RANDOM: Insulin: 13.9 u[IU]/mL (ref 2.0–19.6)

## 2016-09-29 LAB — BASIC METABOLIC PANEL WITH GFR
BUN: 22 mg/dL (ref 7–25)
CO2: 20 mmol/L (ref 20–31)
Calcium: 9.5 mg/dL (ref 8.6–10.3)
Chloride: 103 mmol/L (ref 98–110)
Creat: 1.66 mg/dL — ABNORMAL HIGH (ref 0.70–1.18)
GFR, Est African American: 47 mL/min — ABNORMAL LOW (ref >=60)
GFR, Est Non African American: 40 mL/min — ABNORMAL LOW (ref >=60)
Glucose, Bld: 98 mg/dL (ref 65–99)
Potassium: 4.5 mmol/L (ref 3.5–5.3)
Sodium: 137 mmol/L (ref 135–146)

## 2016-09-29 LAB — MICROALBUMIN / CREATININE URINE RATIO

## 2016-09-29 LAB — TSH: TSH: 2.5 mIU/L (ref 0.40–4.50)

## 2016-09-29 LAB — MAGNESIUM: Magnesium: 1.7 mg/dL (ref 1.5–2.5)

## 2016-09-29 LAB — HEMOGLOBIN A1C
Hgb A1c MFr Bld: 5.7 % — ABNORMAL HIGH (ref <5.7)
Mean Plasma Glucose: 117 mg/dL

## 2016-09-29 LAB — PSA: PSA: 1.7 ng/mL (ref <=4.0)

## 2016-09-29 LAB — VITAMIN D 25 HYDROXY (VIT D DEFICIENCY, FRACTURES): Vit D, 25-Hydroxy: 81 ng/mL (ref 30–100)

## 2016-10-01 ENCOUNTER — Encounter: Payer: Self-pay | Admitting: Internal Medicine

## 2016-10-01 DIAGNOSIS — M25552 Pain in left hip: Secondary | ICD-10-CM | POA: Diagnosis not present

## 2016-10-05 DIAGNOSIS — M25552 Pain in left hip: Secondary | ICD-10-CM | POA: Diagnosis not present

## 2016-10-08 DIAGNOSIS — M25552 Pain in left hip: Secondary | ICD-10-CM | POA: Diagnosis not present

## 2016-10-12 DIAGNOSIS — M25552 Pain in left hip: Secondary | ICD-10-CM | POA: Diagnosis not present

## 2016-10-14 DIAGNOSIS — M25552 Pain in left hip: Secondary | ICD-10-CM | POA: Diagnosis not present

## 2016-10-28 ENCOUNTER — Other Ambulatory Visit: Payer: Self-pay | Admitting: *Deleted

## 2016-10-28 DIAGNOSIS — Z1212 Encounter for screening for malignant neoplasm of rectum: Secondary | ICD-10-CM

## 2016-10-28 LAB — POC HEMOCCULT BLD/STL (HOME/3-CARD/SCREEN)
Card #2 Fecal Occult Blod, POC: NEGATIVE
Card #3 Fecal Occult Blood, POC: NEGATIVE
Fecal Occult Blood, POC: NEGATIVE

## 2016-11-05 DIAGNOSIS — Z96642 Presence of left artificial hip joint: Secondary | ICD-10-CM | POA: Diagnosis not present

## 2016-11-05 DIAGNOSIS — Z471 Aftercare following joint replacement surgery: Secondary | ICD-10-CM | POA: Diagnosis not present

## 2016-11-05 DIAGNOSIS — M1612 Unilateral primary osteoarthritis, left hip: Secondary | ICD-10-CM | POA: Diagnosis not present

## 2016-11-10 ENCOUNTER — Other Ambulatory Visit (HOSPITAL_COMMUNITY): Payer: Self-pay | Admitting: *Deleted

## 2016-11-10 ENCOUNTER — Ambulatory Visit: Payer: Self-pay | Admitting: Orthopedic Surgery

## 2016-11-10 NOTE — Progress Notes (Signed)
Please place orders in EPIC as patient is being scheduled for a pre-op appointment! Thank you! 

## 2016-11-10 NOTE — Patient Instructions (Signed)
[name] [name]  11/10/2016   Your procedure is scheduled on: 11-18-16  Report to [facility] Main  Entrance  Report to admitting at 230 PM  Call this number if you have problems the morning of surgery  [phone]   Remember: ONLY 1 PERSON MAY GO WITH YOU TO SHORT STAY TO GET  READY MORNING OF YOUR SURGERY.  Do not eat food :After Midnight.MAY HAVE CLEAR LIQUIDS FROM MIDNIGHT UNTIL 110 AM DAY OF SURGERY- NOTHING BY MOUTH AFTER 1100 AM DAY OF SURGERY.      Take these medicines the morning of surgery with A SIP OF WATER: FINASTERIDE (PROSCAR), OMEPRAZOLE (PRILOSEC), OXYCODONE IF NEEDED                                You may not have any metal on your body including hair pins and              piercings  Do not wear jewelry, make-up, lotions, powders or perfumes, deodorant             Do not wear nail polish.  Do not shave  48 hours prior to surgery.              Men may shave face and neck.   Do not bring valuables to the hospital. [place].  Contacts, dentures or bridgework may not be worn into surgery.  Leave suitcase in the car. After surgery it may be brought to your room.                  Please read over the following fact sheets you were given: _____________________________________________________________________                CLEAR LIQUID DIET   Foods Allowed                                                                     Foods Excluded  Coffee and tea, regular and decaf                             liquids that you cannot  Plain Jell-O in any flavor                                             see through such as: Fruit ices (not with fruit pulp)                                     milk, soups, orange juice  Iced Popsicles                                    All solid  food Carbonated beverages, regular and diet                                    Cranberry, grape and apple juices Sports  drinks like Gatorade Lightly seasoned clear broth or consume(fat free) Sugar, honey syrup  Sample Menu Breakfast                                Lunch                                     Supper Cranberry juice                    Beef broth                            Chicken broth Jell-O                                     Grape juice                           Apple juice Coffee or tea                        Jell-O                                      Popsicle                                                Coffee or tea                        Coffee or tea  _____________________________________________________________________  [facility] - Preparing for Surgery Before surgery, you can play an important role.  Because skin is not sterile, your skin needs to be as free of germs as possible.  You can reduce the number of germs on your skin by washing with CHG (chlorahexidine gluconate) soap before surgery.  CHG is an antiseptic cleaner which kills germs and bonds with the skin to continue killing germs even after washing. Please DO NOT use if you have an allergy to CHG or antibacterial soaps.  If your skin becomes reddened/irritated stop using the CHG and inform your nurse when you arrive at Short Stay. Do not shave (including legs and underarms) for at least 48 hours prior to the first CHG shower.  You may shave your face/neck. Please follow these instructions carefully:  1.  Shower with CHG Soap the night before surgery and the  morning of Surgery.  2.  If you choose to wash your hair, wash your hair first as usual with your  normal  shampoo.  3.  After you shampoo, rinse your hair and body thoroughly to remove the  shampoo.  4.  Use CHG as you would any other liquid soap.  You can apply chg directly  to the skin and wash                       Gently with a scrungie or clean washcloth.  5.  Apply the CHG Soap to your body ONLY FROM THE NECK DOWN.   Do not use on face/  open                           Wound or open sores. Avoid contact with eyes, ears mouth and genitals (private parts).                       Wash face,  Genitals (private parts) with your normal soap.             6.  Wash thoroughly, paying special attention to the area where your surgery  will be performed.  7.  Thoroughly rinse your body with warm water from the neck down.  8.  DO NOT shower/wash with your normal soap after using and rinsing off  the CHG Soap.                9.  Pat yourself dry with a clean towel.            10.  Wear clean pajamas.            11.  Place clean sheets on your bed the night of your first shower and do not  sleep with pets. Day of Surgery : Do not apply any lotions/deodorants the morning of surgery.  Please wear clean clothes to the hospital/surgery center.  FAILURE TO FOLLOW THESE INSTRUCTIONS MAY RESULT IN THE CANCELLATION OF YOUR SURGERY PATIENT SIGNATURE_________________________________  NURSE SIGNATURE__________________________________  ________________________________________________________________________   [name] [name]  An incentive spirometer is a tool that can help keep your lungs clear and active. This tool measures how well you are filling your lungs with each breath. Taking long deep breaths may help reverse or decrease the chance of developing breathing (pulmonary) problems (especially infection) following:  A long period of time when you are unable to move or be active. BEFORE THE PROCEDURE   If the spirometer includes an indicator to show your best effort, your nurse or respiratory therapist will set it to a desired goal.  If possible, sit up straight or lean slightly forward. Try not to slouch.  Hold the incentive spirometer in an upright position. INSTRUCTIONS FOR USE  1. Sit on the edge of your bed if possible, or sit up as far as you can in bed or on a chair. 2. Hold the incentive spirometer in an upright  position. 3. Breathe out normally. 4. Place the mouthpiece in your mouth and seal your lips tightly around it. 5. Breathe in slowly and as deeply as possible, raising the piston or the ball toward the top of the column. 6. Hold your breath for 3-5 seconds or for as long as possible. Allow the piston or ball to fall to the bottom of the column. 7. Remove the mouthpiece from your mouth and breathe out normally. 8. Rest for a few seconds and repeat Steps 1 through 7 at least 10 times every 1-2 hours when you are awake. Take your time and take a few normal breaths between deep breaths. 9. The spirometer may include an indicator to  show your best effort. Use the indicator as a goal to work toward during each repetition. 10. After each set of 10 deep breaths, practice coughing to be sure your lungs are clear. If you have an incision (the cut made at the time of surgery), support your incision when coughing by placing a pillow or rolled up towels firmly against it. Once you are able to get out of bed, walk around indoors and cough well. You may stop using the incentive spirometer when instructed by your caregiver.  RISKS AND COMPLICATIONS  Take your time so you do not get dizzy or light-headed.  If you are in pain, you may need to take or ask for pain medication before doing incentive spirometry. It is harder to take a deep breath if you are having pain. AFTER USE  Rest and breathe slowly and easily.  It can be helpful to keep track of a log of your progress. Your caregiver can provide you with a simple table to help with this. If you are using the spirometer at home, follow these instructions: [place] IF:   You are having difficultly using the spirometer.  You have trouble using the spirometer as often as instructed.  Your pain medication is not giving enough relief while using the spirometer.  You develop fever of 100.5 F (38.1 C) or higher. SEEK IMMEDIATE MEDICAL CARE IF:    You cough up bloody sputum that had not been present before.  You develop fever of 102 F (38.9 C) or greater.  You develop worsening pain at or near the incision site. MAKE SURE YOU:   Understand these instructions.  Will watch your condition.  Will get help right away if you are not doing well or get worse. Document Released: 06/29/2006 Document Revised: 05/11/2011 Document Reviewed: 08/30/2006 Exit[employer] Patient Information 2014 ExitCare, [place].   ________________________________________________________________________  WHAT IS A BLOOD TRANSFUSION? Blood Transfusion Information  A transfusion is the replacement of blood or some of its parts. Blood is made up of multiple cells which provide different functions.  Red blood cells carry oxygen and are used for blood loss replacement.  White blood cells fight against infection.  Platelets control bleeding.  Plasma helps clot blood.  Other blood products are available for specialized needs, such as hemophilia or other clotting disorders. BEFORE THE TRANSFUSION  Who gives blood for transfusions?   Healthy volunteers who are fully evaluated to make sure their blood is safe. This is blood bank blood. Transfusion therapy is the safest it has ever been in the practice of medicine. Before blood is taken from a donor, a complete history is taken to make sure that person has no history of diseases nor engages in risky social behavior (examples are intravenous drug use or sexual activity with multiple partners). The donor's travel history is screened to minimize risk of transmitting infections, such as malaria. The donated blood is tested for signs of infectious diseases, such as HIV and hepatitis. The blood is then tested to be sure it is compatible with you in order to minimize the chance of a transfusion reaction. If you or a relative donates blood, this is often done in anticipation of surgery and is not appropriate for emergency  situations. It takes many days to process the donated blood. RISKS AND COMPLICATIONS Although transfusion therapy is very safe and saves many lives, the main dangers of transfusion include:   Getting an infectious disease.  Developing a transfusion reaction. This is an allergic reaction  to something in the blood you were given. Every precaution is taken to prevent this. The decision to have a blood transfusion has been considered carefully by your caregiver before blood is given. Blood is not given unless the benefits outweigh the risks. AFTER THE TRANSFUSION  Right after receiving a blood transfusion, you will usually feel much better and more energetic. This is especially true if your red blood cells have gotten low (anemic). The transfusion raises the level of the red blood cells which carry oxygen, and this usually causes an energy increase.  The nurse administering the transfusion will monitor you carefully for complications. HOME CARE INSTRUCTIONS  No special instructions are needed after a transfusion. You may find your energy is better. Speak with your caregiver about any limitations on activity for underlying diseases you may have. SEEK MEDICAL CARE IF:   Your condition is not improving after your transfusion.  You develop redness or irritation at the intravenous (IV) site. SEEK IMMEDIATE MEDICAL CARE IF:  Any of the following symptoms occur over the next 12 hours:  Shaking chills.  You have a temperature by mouth above 102 F (38.9 C), not controlled by medicine.  Chest, back, or muscle pain.  People around you feel you are not acting correctly or are confused.  Shortness of breath or difficulty breathing.  Dizziness and fainting.  You get a rash or develop hives.  You have a decrease in urine output.  Your urine turns a dark color or changes to pink, red, or brown. Any of the following symptoms occur over the next 10 days:  You have a temperature by mouth above  102 F (38.9 C), not controlled by medicine.  Shortness of breath.  Weakness after normal activity.  The white part of the eye turns yellow (jaundice).  You have a decrease in the amount of urine or are urinating less often.  Your urine turns a dark color or changes to pink, red, or brown. Document Released: 02/14/2000 Document Revised: 05/11/2011 Document Reviewed: 10/03/2007 [facility] Patient Information 2014 [place], [place].  _______________________________________________________________________

## 2016-11-10 NOTE — Progress Notes (Signed)
EKG 09-28-16 EPIC  HEAMGLOBIN A 1C 09-28-16 EPIC

## 2016-11-11 ENCOUNTER — Encounter (HOSPITAL_COMMUNITY): Payer: Self-pay

## 2016-11-11 ENCOUNTER — Other Ambulatory Visit: Payer: Self-pay | Admitting: Physician Assistant

## 2016-11-11 ENCOUNTER — Inpatient Hospital Stay (HOSPITAL_COMMUNITY)
Admission: RE | Admit: 2016-11-11 | Discharge: 2016-11-11 | Disposition: A | Discharge status: Home / Self Care | Payer: Medicare Other | Source: Ambulatory Visit

## 2016-11-11 ENCOUNTER — Encounter (HOSPITAL_COMMUNITY)
Admission: RE | Admit: 2016-11-11 | Discharge: 2016-11-11 | Disposition: A | Discharge status: Home / Self Care | Payer: Medicare Other | Source: Ambulatory Visit | Attending: Orthopedic Surgery | Admitting: Orthopedic Surgery

## 2016-11-11 DIAGNOSIS — Z01818 Encounter for other preprocedural examination: Secondary | ICD-10-CM | POA: Diagnosis not present

## 2016-11-11 DIAGNOSIS — M1612 Unilateral primary osteoarthritis, left hip: Secondary | ICD-10-CM | POA: Insufficient documentation

## 2016-11-11 DIAGNOSIS — Z96642 Presence of left artificial hip joint: Secondary | ICD-10-CM | POA: Diagnosis not present

## 2016-11-11 LAB — COMPREHENSIVE METABOLIC PANEL
ALT: 27 U/L (ref 17–63)
AST: 32 U/L (ref 15–41)
Albumin: 4.1 g/dL (ref 3.5–5.0)
Alkaline Phosphatase: 31 U/L — ABNORMAL LOW (ref 38–126)
Anion gap: 9 (ref 5–15)
BUN: 26 mg/dL — ABNORMAL HIGH (ref 6–20)
CO2: 24 mmol/L (ref 22–32)
Calcium: 9.8 mg/dL (ref 8.9–10.3)
Chloride: 107 mmol/L (ref 101–111)
Creatinine, Ser: 1.49 mg/dL — ABNORMAL HIGH (ref 0.61–1.24)
GFR calc Af Amer: 52 mL/min — ABNORMAL LOW (ref >60)
GFR calc non Af Amer: 45 mL/min — ABNORMAL LOW (ref >60)
Glucose, Bld: 88 mg/dL (ref 65–99)
Potassium: 4.2 mmol/L (ref 3.5–5.1)
Sodium: 140 mmol/L (ref 135–145)
Total Bilirubin: 1.1 mg/dL (ref 0.3–1.2)
Total Protein: 7.5 g/dL (ref 6.5–8.1)

## 2016-11-11 LAB — SURGICAL PCR SCREEN
MRSA, PCR: NEGATIVE
Staphylococcus aureus: NEGATIVE

## 2016-11-11 LAB — CBC
HCT: 44.9 % (ref 39.0–52.0)
Hemoglobin: 15.4 g/dL (ref 13.0–17.0)
MCH: 30.7 pg (ref 26.0–34.0)
MCHC: 34.3 g/dL (ref 30.0–36.0)
MCV: 89.4 fL (ref 78.0–100.0)
Platelets: 275 10*3/uL (ref 150–400)
RBC: 5.02 MIL/uL (ref 4.22–5.81)
RDW: 13.9 % (ref 11.5–15.5)
WBC: 7.1 10*3/uL (ref 4.0–10.5)

## 2016-11-11 LAB — HEMOGLOBIN A1C
Hgb A1c MFr Bld: 6 % — ABNORMAL HIGH (ref 4.8–5.6)
Mean Plasma Glucose: 125.5 mg/dL

## 2016-11-11 LAB — PROTIME-INR
INR: 1
Prothrombin Time: 13.1 seconds (ref 11.4–15.2)

## 2016-11-11 LAB — APTT: aPTT: 28 seconds (ref 24–36)

## 2016-11-11 NOTE — Patient Instructions (Signed)
[name] [name]  11/11/2016   Your procedure is scheduled on: 11-18-16  Report to [facility] Main  Entrance  Report to admitting at 230 pm  Call this number if you have problems the morning of surgery  [phone]   Remember: ONLY 1 PERSON MAY GO WITH YOU TO SHORT STAY TO GET  READY MORNING OF YOUR SURGERY.  Do not eat food :After Midnight.clear liquids from midnight until 1100 am day of surgery- nothing by mouth after 1100 am day of surgery.      Take these medicines the morning of surgery with A SIP OF WATER: FINASTERIDE, OMEPRAZOLE (PRILOSEC), FENOFIBRATE, FLOMAX                               You may not have any metal on your body including hair pins and              piercings  Do not wear jewelry, make-up, lotions, powders or perfumes, deodorant             Do not wear nail polish.  Do not shave  48 hours prior to surgery.              Men may shave face and neck.   Do not bring valuables to the hospital. [place].  Contacts, dentures or bridgework may not be worn into surgery.  Leave suitcase in the car. After surgery it may be brought to your room.                  Please read over the following fact sheets you were given: _____________________________________________________________________                CLEAR LIQUID DIET   Foods Allowed                                                                     Foods Excluded  Coffee and tea, regular and decaf                             liquids that you cannot  Plain Jell-O in any flavor                                             see through such as: Fruit ices (not with fruit pulp)                                     milk, soups, orange juice  Iced Popsicles                                    All solid food Carbonated beverages, regular and  diet                                    Cranberry, grape and apple juices Sports drinks like  Gatorade Lightly seasoned clear broth or consume(fat free) Sugar, honey syrup  Sample Menu Breakfast                                Lunch                                     Supper Cranberry juice                    Beef broth                            Chicken broth Jell-O                                     Grape juice                           Apple juice Coffee or tea                        Jell-O                                      Popsicle                                                Coffee or tea                        Coffee or tea  _____________________________________________________________________  [facility] - Preparing for Surgery Before surgery, you can play an important role.  Because skin is not sterile, your skin needs to be as free of germs as possible.  You can reduce the number of germs on your skin by washing with CHG (chlorahexidine gluconate) soap before surgery.  CHG is an antiseptic cleaner which kills germs and bonds with the skin to continue killing germs even after washing. Please DO NOT use if you have an allergy to CHG or antibacterial soaps.  If your skin becomes reddened/irritated stop using the CHG and inform your nurse when you arrive at Short Stay. Do not shave (including legs and underarms) for at least 48 hours prior to the first CHG shower.  You may shave your face/neck. Please follow these instructions carefully:  1.  Shower with CHG Soap the night before surgery and the  morning of Surgery.  2.  If you choose to wash your hair, wash your hair first as usual with your  normal  shampoo.  3.  After you shampoo, rinse your hair and body thoroughly to remove the  shampoo.  4.  Use CHG as you would any other liquid soap.  You can apply chg directly  to the skin and wash                       Gently with a scrungie or clean washcloth.  5.  Apply the CHG Soap to your body ONLY FROM THE NECK DOWN.   Do not use on face/ open                            Wound or open sores. Avoid contact with eyes, ears mouth and genitals (private parts).                       Wash face,  Genitals (private parts) with your normal soap.             6.  Wash thoroughly, paying special attention to the area where your surgery  will be performed.  7.  Thoroughly rinse your body with warm water from the neck down.  8.  DO NOT shower/wash with your normal soap after using and rinsing off  the CHG Soap.                9.  Pat yourself dry with a clean towel.            10.  Wear clean pajamas.            11.  Place clean sheets on your bed the night of your first shower and do not  sleep with pets. Day of Surgery : Do not apply any lotions/deodorants the morning of surgery.  Please wear clean clothes to the hospital/surgery center.  FAILURE TO FOLLOW THESE INSTRUCTIONS MAY RESULT IN THE CANCELLATION OF YOUR SURGERY PATIENT SIGNATURE_________________________________  NURSE SIGNATURE__________________________________  ________________________________________________________________________   [name]  An incentive spirometer is a tool that can help keep your lungs clear and active. This tool measures how well you are filling your lungs with each breath. Taking long deep breaths may help reverse or decrease the chance of developing breathing (pulmonary) problems (especially infection) following:  A long period of time when you are unable to move or be active. BEFORE THE PROCEDURE   If the spirometer includes an indicator to show your best effort, your nurse or respiratory therapist will set it to a desired goal.  If possible, sit up straight or lean slightly forward. Try not to slouch.  Hold the incentive spirometer in an upright position. INSTRUCTIONS FOR USE  1. Sit on the edge of your bed if possible, or sit up as far as you can in bed or on a chair. 2. Hold the incentive spirometer in an upright position. 3. Breathe out  normally. 4. Place the mouthpiece in your mouth and seal your lips tightly around it. 5. Breathe in slowly and as deeply as possible, raising the piston or the ball toward the top of the column. 6. Hold your breath for 3-5 seconds or for as long as possible. Allow the piston or ball to fall to the bottom of the column. 7. Remove the mouthpiece from your mouth and breathe out normally. 8. Rest for a few seconds and repeat Steps 1 through 7 at least 10 times every 1-2 hours when you are awake. Take your time and take a few normal breaths between deep breaths. 9. The spirometer may include an indicator to  show your best effort. Use the indicator as a goal to work toward during each repetition. 10. After each set of 10 deep breaths, practice coughing to be sure your lungs are clear. If you have an incision (the cut made at the time of surgery), support your incision when coughing by placing a pillow or rolled up towels firmly against it. Once you are able to get out of bed, walk around indoors and cough well. You may stop using the incentive spirometer when instructed by your caregiver.  RISKS AND COMPLICATIONS  Take your time so you do not get dizzy or light-headed.  If you are in pain, you may need to take or ask for pain medication before doing incentive spirometry. It is harder to take a deep breath if you are having pain. AFTER USE  Rest and breathe slowly and easily.  It can be helpful to keep track of a log of your progress. Your caregiver can provide you with a simple table to help with this. If you are using the spirometer at home, follow these instructions: [place] IF:   You are having difficultly using the spirometer.  You have trouble using the spirometer as often as instructed.  Your pain medication is not giving enough relief while using the spirometer.  You develop fever of 100.5 F (38.1 C) or higher. SEEK IMMEDIATE MEDICAL CARE IF:   You cough up bloody sputum  that had not been present before.  You develop fever of 102 F (38.9 C) or greater.  You develop worsening pain at or near the incision site. MAKE SURE YOU:   Understand these instructions.  Will watch your condition.  Will get help right away if you are not doing well or get worse. Document Released: 06/29/2006 Document Revised: 05/11/2011 Document Reviewed: 08/30/2006 [employer] Patient Information 2014 [employer], [place].   ________________________________________________________________________  WHAT IS A BLOOD TRANSFUSION? Blood Transfusion Information  A transfusion is the replacement of blood or some of its parts. Blood is made up of multiple cells which provide different functions.  Red blood cells carry oxygen and are used for blood loss replacement.  White blood cells fight against infection.  Platelets control bleeding.  Plasma helps clot blood.  Other blood products are available for specialized needs, such as hemophilia or other clotting disorders. BEFORE THE TRANSFUSION  Who gives blood for transfusions?   Healthy volunteers who are fully evaluated to make sure their blood is safe. This is blood bank blood. Transfusion therapy is the safest it has ever been in the practice of medicine. Before blood is taken from a donor, a complete history is taken to make sure that person has no history of diseases nor engages in risky social behavior (examples are intravenous drug use or sexual activity with multiple partners). The donor's travel history is screened to minimize risk of transmitting infections, such as malaria. The donated blood is tested for signs of infectious diseases, such as HIV and hepatitis. The blood is then tested to be sure it is compatible with you in order to minimize the chance of a transfusion reaction. If you or a relative donates blood, this is often done in anticipation of surgery and is not appropriate for emergency situations. It takes many days to  process the donated blood. RISKS AND COMPLICATIONS Although transfusion therapy is very safe and saves many lives, the main dangers of transfusion include:   Getting an infectious disease.  Developing a transfusion reaction. This is an allergic reaction  to something in the blood you were given. Every precaution is taken to prevent this. The decision to have a blood transfusion has been considered carefully by your caregiver before blood is given. Blood is not given unless the benefits outweigh the risks. AFTER THE TRANSFUSION  Right after receiving a blood transfusion, you will usually feel much better and more energetic. This is especially true if your red blood cells have gotten low (anemic). The transfusion raises the level of the red blood cells which carry oxygen, and this usually causes an energy increase.  The nurse administering the transfusion will monitor you carefully for complications. HOME CARE INSTRUCTIONS  No special instructions are needed after a transfusion. You may find your energy is better. Speak with your caregiver about any limitations on activity for underlying diseases you may have. SEEK MEDICAL CARE IF:   Your condition is not improving after your transfusion.  You develop redness or irritation at the intravenous (IV) site. SEEK IMMEDIATE MEDICAL CARE IF:  Any of the following symptoms occur over the next 12 hours:  Shaking chills.  You have a temperature by mouth above 102 F (38.9 C), not controlled by medicine.  Chest, back, or muscle pain.  People around you feel you are not acting correctly or are confused.  Shortness of breath or difficulty breathing.  Dizziness and fainting.  You get a rash or develop hives.  You have a decrease in urine output.  Your urine turns a dark color or changes to pink, red, or brown. Any of the following symptoms occur over the next 10 days:  You have a temperature by mouth above 102 F (38.9 C), not controlled by  medicine.  Shortness of breath.  Weakness after normal activity.  The white part of the eye turns yellow (jaundice).  You have a decrease in the amount of urine or are urinating less often.  Your urine turns a dark color or changes to pink, red, or brown. Document Released: 02/14/2000 Document Revised: 05/11/2011 Document Reviewed: 10/03/2007 [facility] Patient Information 2014 [place], [place].  _______________________________________________________________________

## 2016-11-12 NOTE — Progress Notes (Signed)
cmet results afxed to dr Wynelle Link by epic

## 2016-11-18 ENCOUNTER — Inpatient Hospital Stay (HOSPITAL_COMMUNITY): Payer: Medicare Other

## 2016-11-18 ENCOUNTER — Inpatient Hospital Stay (HOSPITAL_COMMUNITY)
Admission: RE | Admit: 2016-11-18 | Discharge: 2016-11-21 | DRG: 468 | Disposition: A | Discharge status: Skilled Nursing Facility | Payer: Medicare Other | Source: Ambulatory Visit | Attending: Orthopedic Surgery | Admitting: Orthopedic Surgery

## 2016-11-18 ENCOUNTER — Inpatient Hospital Stay (HOSPITAL_COMMUNITY): Payer: Medicare Other | Admitting: Certified Registered Nurse Anesthetist

## 2016-11-18 ENCOUNTER — Encounter (HOSPITAL_COMMUNITY)
Admission: RE | Disposition: A | Discharge status: Skilled Nursing Facility | Payer: Self-pay | Source: Ambulatory Visit | Attending: Orthopedic Surgery

## 2016-11-18 ENCOUNTER — Encounter (HOSPITAL_COMMUNITY): Payer: Self-pay | Admitting: *Deleted

## 2016-11-18 DIAGNOSIS — Z7982 Long term (current) use of aspirin: Secondary | ICD-10-CM | POA: Diagnosis not present

## 2016-11-18 DIAGNOSIS — Y793 Surgical instruments, materials and orthopedic devices (including sutures) associated with adverse incidents: Secondary | ICD-10-CM | POA: Diagnosis present

## 2016-11-18 DIAGNOSIS — I10 Essential (primary) hypertension: Secondary | ICD-10-CM | POA: Diagnosis present

## 2016-11-18 DIAGNOSIS — K219 Gastro-esophageal reflux disease without esophagitis: Secondary | ICD-10-CM | POA: Diagnosis not present

## 2016-11-18 DIAGNOSIS — Z87891 Personal history of nicotine dependence: Secondary | ICD-10-CM | POA: Diagnosis not present

## 2016-11-18 DIAGNOSIS — E785 Hyperlipidemia, unspecified: Secondary | ICD-10-CM | POA: Diagnosis present

## 2016-11-18 DIAGNOSIS — Z96642 Presence of left artificial hip joint: Secondary | ICD-10-CM | POA: Diagnosis present

## 2016-11-18 DIAGNOSIS — R2681 Unsteadiness on feet: Secondary | ICD-10-CM | POA: Diagnosis not present

## 2016-11-18 DIAGNOSIS — E669 Obesity, unspecified: Secondary | ICD-10-CM | POA: Diagnosis present

## 2016-11-18 DIAGNOSIS — T84031A Mechanical loosening of internal left hip prosthetic joint, initial encounter: Principal | ICD-10-CM | POA: Diagnosis present

## 2016-11-18 DIAGNOSIS — Z87442 Personal history of urinary calculi: Secondary | ICD-10-CM | POA: Diagnosis not present

## 2016-11-18 DIAGNOSIS — M6281 Muscle weakness (generalized): Secondary | ICD-10-CM | POA: Diagnosis not present

## 2016-11-18 DIAGNOSIS — Z791 Long term (current) use of non-steroidal anti-inflammatories (NSAID): Secondary | ICD-10-CM

## 2016-11-18 DIAGNOSIS — R7303 Prediabetes: Secondary | ICD-10-CM | POA: Diagnosis not present

## 2016-11-18 DIAGNOSIS — I129 Hypertensive chronic kidney disease with stage 1 through stage 4 chronic kidney disease, or unspecified chronic kidney disease: Secondary | ICD-10-CM | POA: Diagnosis not present

## 2016-11-18 DIAGNOSIS — Z96649 Presence of unspecified artificial hip joint: Secondary | ICD-10-CM

## 2016-11-18 DIAGNOSIS — R278 Other lack of coordination: Secondary | ICD-10-CM | POA: Diagnosis not present

## 2016-11-18 DIAGNOSIS — Z6834 Body mass index (BMI) 34.0-34.9, adult: Secondary | ICD-10-CM

## 2016-11-18 DIAGNOSIS — M25552 Pain in left hip: Secondary | ICD-10-CM | POA: Diagnosis not present

## 2016-11-18 DIAGNOSIS — N200 Calculus of kidney: Secondary | ICD-10-CM | POA: Diagnosis not present

## 2016-11-18 DIAGNOSIS — T84018A Broken internal joint prosthesis, other site, initial encounter: Secondary | ICD-10-CM

## 2016-11-18 DIAGNOSIS — Z86010 Personal history of colonic polyps: Secondary | ICD-10-CM | POA: Diagnosis not present

## 2016-11-18 DIAGNOSIS — S7221XA Displaced subtrochanteric fracture of right femur, initial encounter for closed fracture: Secondary | ICD-10-CM | POA: Diagnosis not present

## 2016-11-18 DIAGNOSIS — N183 Chronic kidney disease, stage 3 (moderate): Secondary | ICD-10-CM | POA: Diagnosis not present

## 2016-11-18 HISTORY — PX: TOTAL HIP REVISION: SHX763

## 2016-11-18 LAB — TYPE AND SCREEN
ABO/RH(D): A POS
Antibody Screen: NEGATIVE

## 2016-11-18 SURGERY — TOTAL HIP REVISION
Anesthesia: General | Site: Hip | Laterality: Left

## 2016-11-18 MED ORDER — BISOPROLOL FUMARATE 5 MG PO TABS
5.0000 mg | ORAL_TABLET | Freq: Once | ORAL | Status: AC
  Administered 2016-11-18: 5 mg via ORAL
  Filled 2016-11-18: qty 1

## 2016-11-18 MED ORDER — PANTOPRAZOLE SODIUM 40 MG PO TBEC: 80.0000 mg | DELAYED_RELEASE_TABLET | Freq: Every day | ORAL | Status: DC

## 2016-11-18 MED ORDER — DEXAMETHASONE SODIUM PHOSPHATE 10 MG/ML IJ SOLN
INTRAMUSCULAR | Status: AC
  Filled 2016-11-18: qty 1

## 2016-11-18 MED ORDER — DEXAMETHASONE SODIUM PHOSPHATE 10 MG/ML IJ SOLN
10.0000 mg | Freq: Once | INTRAMUSCULAR | Status: AC
  Administered 2016-11-18: 10 mg via INTRAVENOUS

## 2016-11-18 MED ORDER — DOCUSATE SODIUM 100 MG PO CAPS
100.0000 mg | ORAL_CAPSULE | Freq: Two times a day (BID) | ORAL | Status: DC
  Administered 2016-11-19 – 2016-11-21 (×4): 100 mg via ORAL
  Filled 2016-11-18 (×5): qty 1

## 2016-11-18 MED ORDER — ONDANSETRON HCL 4 MG/2ML IJ SOLN
INTRAMUSCULAR | Status: DC | PRN
  Administered 2016-11-18: 4 mg via INTRAVENOUS

## 2016-11-18 MED ORDER — BISACODYL 10 MG RE SUPP: 10.0000 mg | Freq: Every day | RECTAL | Status: DC | PRN

## 2016-11-18 MED ORDER — PROPOFOL 10 MG/ML IV BOLUS
INTRAVENOUS | Status: DC | PRN
  Administered 2016-11-18: 200 mg via INTRAVENOUS

## 2016-11-18 MED ORDER — TRAMADOL HCL 50 MG PO TABS: 50.0000 mg | ORAL_TABLET | Freq: Four times a day (QID) | ORAL | Status: DC | PRN

## 2016-11-18 MED ORDER — DIPHENHYDRAMINE HCL 12.5 MG/5ML PO ELIX: 12.5000 mg | ORAL_SOLUTION | ORAL | Status: DC | PRN

## 2016-11-18 MED ORDER — RIVAROXABAN 10 MG PO TABS
10.0000 mg | ORAL_TABLET | Freq: Every day | ORAL | Status: DC
  Administered 2016-11-19 – 2016-11-21 (×3): 10 mg via ORAL
  Filled 2016-11-18 (×3): qty 1

## 2016-11-18 MED ORDER — SUGAMMADEX SODIUM 200 MG/2ML IV SOLN
INTRAVENOUS | Status: AC
  Filled 2016-11-18: qty 2

## 2016-11-18 MED ORDER — METOCLOPRAMIDE HCL 5 MG/ML IJ SOLN: 5.0000 mg | Freq: Three times a day (TID) | INTRAMUSCULAR | Status: DC | PRN

## 2016-11-18 MED ORDER — SODIUM CHLORIDE 0.9 % IV SOLN
INTRAVENOUS | Status: DC
  Administered 2016-11-18: 22:00:00 via INTRAVENOUS

## 2016-11-18 MED ORDER — PHENYLEPHRINE 40 MCG/ML (10ML) SYRINGE FOR IV PUSH (FOR BLOOD PRESSURE SUPPORT)
PREFILLED_SYRINGE | INTRAVENOUS | Status: AC
  Filled 2016-11-18: qty 10

## 2016-11-18 MED ORDER — EPHEDRINE 5 MG/ML INJ
INTRAVENOUS | Status: AC
  Filled 2016-11-18: qty 10

## 2016-11-18 MED ORDER — MEPERIDINE HCL 50 MG/ML IJ SOLN: 6.2500 mg | INTRAMUSCULAR | Status: DC | PRN

## 2016-11-18 MED ORDER — ACETAMINOPHEN 650 MG RE SUPP: 650.0000 mg | Freq: Four times a day (QID) | RECTAL | Status: DC | PRN

## 2016-11-18 MED ORDER — FENTANYL CITRATE (PF) 100 MCG/2ML IJ SOLN
INTRAMUSCULAR | Status: DC | PRN
  Administered 2016-11-18 (×5): 50 ug via INTRAVENOUS

## 2016-11-18 MED ORDER — HYDROMORPHONE HCL 1 MG/ML IJ SOLN
INTRAMUSCULAR | Status: DC | PRN
  Administered 2016-11-18 (×2): 1 mg via INTRAVENOUS

## 2016-11-18 MED ORDER — ONDANSETRON HCL 4 MG/2ML IJ SOLN: 4.0000 mg | Freq: Four times a day (QID) | INTRAMUSCULAR | Status: DC | PRN

## 2016-11-18 MED ORDER — PROMETHAZINE HCL 25 MG/ML IJ SOLN: 6.2500 mg | INTRAMUSCULAR | Status: DC | PRN

## 2016-11-18 MED ORDER — ONDANSETRON HCL 4 MG PO TABS: 4.0000 mg | ORAL_TABLET | Freq: Four times a day (QID) | ORAL | Status: DC | PRN

## 2016-11-18 MED ORDER — LIDOCAINE 2% (20 MG/ML) 5 ML SYRINGE
INTRAMUSCULAR | Status: AC
  Filled 2016-11-18: qty 5

## 2016-11-18 MED ORDER — OXYCODONE HCL 5 MG PO TABS
5.0000 mg | ORAL_TABLET | ORAL | Status: DC | PRN
  Administered 2016-11-18: 5 mg via ORAL
  Administered 2016-11-19: 10:00:00 10 mg via ORAL
  Administered 2016-11-19: 5 mg via ORAL
  Administered 2016-11-19 – 2016-11-20 (×4): 10 mg via ORAL
  Administered 2016-11-20 – 2016-11-21 (×3): 5 mg via ORAL
  Administered 2016-11-21: 10 mg via ORAL
  Filled 2016-11-18 (×2): qty 1
  Filled 2016-11-18: qty 2
  Filled 2016-11-18: qty 1
  Filled 2016-11-18 (×5): qty 2
  Filled 2016-11-18: qty 1
  Filled 2016-11-18 (×2): qty 2

## 2016-11-18 MED ORDER — ACETAMINOPHEN 500 MG PO TABS
1000.0000 mg | ORAL_TABLET | Freq: Four times a day (QID) | ORAL | Status: AC
  Administered 2016-11-18 – 2016-11-19 (×4): 1000 mg via ORAL
  Filled 2016-11-18 (×4): qty 2

## 2016-11-18 MED ORDER — ROCURONIUM BROMIDE 50 MG/5ML IV SOSY
PREFILLED_SYRINGE | INTRAVENOUS | Status: AC
  Filled 2016-11-18: qty 5

## 2016-11-18 MED ORDER — FENTANYL CITRATE (PF) 250 MCG/5ML IJ SOLN
INTRAMUSCULAR | Status: AC
  Filled 2016-11-18: qty 5

## 2016-11-18 MED ORDER — EPHEDRINE SULFATE-NACL 50-0.9 MG/10ML-% IV SOSY
PREFILLED_SYRINGE | INTRAVENOUS | Status: DC | PRN
  Administered 2016-11-18 (×4): 10 mg via INTRAVENOUS
  Administered 2016-11-18 (×2): 15 mg via INTRAVENOUS

## 2016-11-18 MED ORDER — PROPOFOL 10 MG/ML IV BOLUS
INTRAVENOUS | Status: AC
  Filled 2016-11-18: qty 20

## 2016-11-18 MED ORDER — SODIUM CHLORIDE 0.9 % IR SOLN
Status: DC | PRN
  Administered 2016-11-18: 1000 mL

## 2016-11-18 MED ORDER — PHENYLEPHRINE 40 MCG/ML (10ML) SYRINGE FOR IV PUSH (FOR BLOOD PRESSURE SUPPORT)
PREFILLED_SYRINGE | INTRAVENOUS | Status: DC | PRN
  Administered 2016-11-18: 40 ug via INTRAVENOUS
  Administered 2016-11-18 (×2): 80 ug via INTRAVENOUS
  Administered 2016-11-18: 120 ug via INTRAVENOUS
  Administered 2016-11-18: 80 ug via INTRAVENOUS

## 2016-11-18 MED ORDER — METOCLOPRAMIDE HCL 5 MG PO TABS: 5.0000 mg | ORAL_TABLET | Freq: Three times a day (TID) | ORAL | Status: DC | PRN

## 2016-11-18 MED ORDER — METHOCARBAMOL 1000 MG/10ML IJ SOLN
500.0000 mg | Freq: Four times a day (QID) | INTRAVENOUS | Status: DC | PRN
  Administered 2016-11-18: 500 mg via INTRAVENOUS
  Filled 2016-11-18: qty 550

## 2016-11-18 MED ORDER — POLYETHYLENE GLYCOL 3350 17 G PO PACK
17.0000 g | PACK | Freq: Every day | ORAL | Status: DC | PRN
  Filled 2016-11-18: qty 1

## 2016-11-18 MED ORDER — ACETAMINOPHEN 10 MG/ML IV SOLN
1000.0000 mg | Freq: Once | INTRAVENOUS | Status: AC
  Administered 2016-11-18: 1000 mg via INTRAVENOUS

## 2016-11-18 MED ORDER — MENTHOL 3 MG MT LOZG: 1.0000 | LOZENGE | OROMUCOSAL | Status: DC | PRN

## 2016-11-18 MED ORDER — DEXAMETHASONE SODIUM PHOSPHATE 10 MG/ML IJ SOLN
10.0000 mg | Freq: Once | INTRAMUSCULAR | Status: AC
  Administered 2016-11-19: 10 mg via INTRAVENOUS
  Filled 2016-11-18: qty 1

## 2016-11-18 MED ORDER — TAMSULOSIN HCL 0.4 MG PO CAPS
0.4000 mg | ORAL_CAPSULE | Freq: Every day | ORAL | Status: DC
Start: 2016-11-19 — End: 2016-11-21
  Administered 2016-11-19 – 2016-11-21 (×3): 0.4 mg via ORAL
  Filled 2016-11-18 (×3): qty 1

## 2016-11-18 MED ORDER — BISOPROLOL-HYDROCHLOROTHIAZIDE 5-6.25 MG PO TABS
1.0000 | ORAL_TABLET | Freq: Every day | ORAL | Status: DC
  Administered 2016-11-19 – 2016-11-20 (×2): 1 via ORAL
  Filled 2016-11-18 (×3): qty 1

## 2016-11-18 MED ORDER — PHENOL 1.4 % MT LIQD
1.0000 | OROMUCOSAL | Status: DC | PRN
  Filled 2016-11-18: qty 177

## 2016-11-18 MED ORDER — CEFAZOLIN SODIUM-DEXTROSE 2-4 GM/100ML-% IV SOLN
INTRAVENOUS | Status: AC
  Filled 2016-11-18: qty 100

## 2016-11-18 MED ORDER — SUGAMMADEX SODIUM 500 MG/5ML IV SOLN
INTRAVENOUS | Status: AC
  Filled 2016-11-18: qty 5

## 2016-11-18 MED ORDER — ACETAMINOPHEN 10 MG/ML IV SOLN
INTRAVENOUS | Status: AC
  Filled 2016-11-18: qty 100

## 2016-11-18 MED ORDER — BUPIVACAINE HCL 0.25 % IJ SOLN
INTRAMUSCULAR | Status: AC
  Filled 2016-11-18: qty 1

## 2016-11-18 MED ORDER — HYDROMORPHONE HCL-NACL 0.5-0.9 MG/ML-% IV SOSY: 0.2500 mg | PREFILLED_SYRINGE | INTRAVENOUS | Status: DC | PRN

## 2016-11-18 MED ORDER — SODIUM CHLORIDE 0.9 % IJ SOLN
INTRAMUSCULAR | Status: AC
  Filled 2016-11-18: qty 50

## 2016-11-18 MED ORDER — FLEET ENEMA 7-19 GM/118ML RE ENEM: 1.0000 | ENEMA | Freq: Once | RECTAL | Status: DC | PRN

## 2016-11-18 MED ORDER — ONDANSETRON HCL 4 MG/2ML IJ SOLN
INTRAMUSCULAR | Status: AC
  Filled 2016-11-18: qty 2

## 2016-11-18 MED ORDER — SUGAMMADEX SODIUM 500 MG/5ML IV SOLN
INTRAVENOUS | Status: DC | PRN
  Administered 2016-11-18: 200 mg via INTRAVENOUS

## 2016-11-18 MED ORDER — LOSARTAN POTASSIUM 50 MG PO TABS: 100.0000 mg | ORAL_TABLET | Freq: Every day | ORAL | Status: DC

## 2016-11-18 MED ORDER — DEXAMETHASONE SODIUM PHOSPHATE 10 MG/ML IJ SOLN: INTRAMUSCULAR | Status: DC | PRN

## 2016-11-18 MED ORDER — HYDROMORPHONE HCL 2 MG/ML IJ SOLN
INTRAMUSCULAR | Status: AC
  Filled 2016-11-18: qty 1

## 2016-11-18 MED ORDER — OXYBUTYNIN CHLORIDE 5 MG PO TABS: 5.0000 mg | ORAL_TABLET | Freq: Three times a day (TID) | ORAL | Status: DC | PRN

## 2016-11-18 MED ORDER — MAGNESIUM OXIDE 400 (241.3 MG) MG PO TABS
400.0000 mg | ORAL_TABLET | Freq: Every day | ORAL | Status: DC
  Administered 2016-11-19 – 2016-11-21 (×3): 400 mg via ORAL
  Filled 2016-11-18 (×3): qty 1

## 2016-11-18 MED ORDER — FINASTERIDE 5 MG PO TABS
5.0000 mg | ORAL_TABLET | Freq: Every day | ORAL | Status: DC
  Administered 2016-11-19 – 2016-11-21 (×3): 5 mg via ORAL
  Filled 2016-11-18 (×3): qty 1

## 2016-11-18 MED ORDER — LACTATED RINGERS IV SOLN
INTRAVENOUS | Status: DC
  Administered 2016-11-18 (×3): via INTRAVENOUS

## 2016-11-18 MED ORDER — METHOCARBAMOL 500 MG PO TABS
500.0000 mg | ORAL_TABLET | Freq: Four times a day (QID) | ORAL | Status: DC | PRN
  Administered 2016-11-19: 21:00:00 500 mg via ORAL
  Filled 2016-11-18: qty 1

## 2016-11-18 MED ORDER — ACETAMINOPHEN 325 MG PO TABS
650.0000 mg | ORAL_TABLET | Freq: Four times a day (QID) | ORAL | Status: DC | PRN
  Administered 2016-11-21: 10:00:00 650 mg via ORAL
  Filled 2016-11-18: qty 2

## 2016-11-18 MED ORDER — CEFAZOLIN SODIUM-DEXTROSE 2-4 GM/100ML-% IV SOLN
2.0000 g | Freq: Four times a day (QID) | INTRAVENOUS | Status: AC
  Administered 2016-11-18 – 2016-11-19 (×2): 2 g via INTRAVENOUS
  Filled 2016-11-18 (×2): qty 100

## 2016-11-18 MED ORDER — CEFAZOLIN SODIUM-DEXTROSE 2-4 GM/100ML-% IV SOLN
2.0000 g | INTRAVENOUS | Status: AC
  Administered 2016-11-18: 2 g via INTRAVENOUS

## 2016-11-18 MED ORDER — ROCURONIUM BROMIDE 50 MG/5ML IV SOSY
PREFILLED_SYRINGE | INTRAVENOUS | Status: DC | PRN
  Administered 2016-11-18: 50 mg via INTRAVENOUS
  Administered 2016-11-18: 20 mg via INTRAVENOUS

## 2016-11-18 MED ORDER — LIDOCAINE 2% (20 MG/ML) 5 ML SYRINGE
INTRAMUSCULAR | Status: DC | PRN
  Administered 2016-11-18: 100 mg via INTRAVENOUS

## 2016-11-18 SURGICAL SUPPLY — 75 items
BAG DECANTER FOR FLEXI CONT (MISCELLANEOUS) ×2 IMPLANT
BAG SPEC THK2 15X12 ZIP CLS (MISCELLANEOUS) ×1
BAG ZIPLOCK 12X15 (MISCELLANEOUS) ×4 IMPLANT
BIT DRILL 2.8X128 (BIT) ×2 IMPLANT
BLADE EXTENDED COATED 6.5IN (ELECTRODE) ×2 IMPLANT
BLADE SAW SAG 73X25 THK (BLADE) ×1
BLADE SAW SGTL 18X1.27X75 (BLADE) ×1 IMPLANT
BLADE SAW SGTL 73X25 THK (BLADE) ×1 IMPLANT
BRUSH FEMORAL CANAL (MISCELLANEOUS) IMPLANT
CABLE ASSY CERCLAGE SST 1.8X55 (Orthopedic Implant) ×6 IMPLANT
COVER SURGICAL LIGHT HANDLE (MISCELLANEOUS) ×2 IMPLANT
DRAPE INCISE IOBAN 66X45 STRL (DRAPES) ×2 IMPLANT
DRAPE ORTHO SPLIT 77X108 STRL (DRAPES) ×4
DRAPE POUCH INSTRU U-SHP 10X18 (DRAPES) ×2 IMPLANT
DRAPE SURG ORHT 6 SPLT 77X108 (DRAPES) ×2 IMPLANT
DRAPE U-SHAPE 47X51 STRL (DRAPES) ×2 IMPLANT
DRSG EMULSION OIL 3X16 NADH (GAUZE/BANDAGES/DRESSINGS) ×1 IMPLANT
DRSG MEPILEX BORDER 4X12 (GAUZE/BANDAGES/DRESSINGS) ×1 IMPLANT
DRSG MEPILEX BORDER 4X4 (GAUZE/BANDAGES/DRESSINGS) ×3 IMPLANT
DRSG MEPILEX BORDER 4X8 (GAUZE/BANDAGES/DRESSINGS) ×2 IMPLANT
DURAPREP 26ML APPLICATOR (WOUND CARE) ×2 IMPLANT
ELECT REM PT RETURN 15FT ADLT (MISCELLANEOUS) ×2 IMPLANT
EVACUATOR 1/8 PVC DRAIN (DRAIN) ×2 IMPLANT
FACESHIELD WRAPAROUND (MASK) ×10 IMPLANT
FACESHIELD WRAPAROUND OR TEAM (MASK) ×4 IMPLANT
GAUZE SPONGE 4X4 12PLY STRL (GAUZE/BANDAGES/DRESSINGS) ×2 IMPLANT
GLOVE BIO SURGEON STRL SZ7.5 (GLOVE) ×2 IMPLANT
GLOVE BIO SURGEON STRL SZ8 (GLOVE) ×3 IMPLANT
GLOVE BIOGEL PI IND STRL 8 (GLOVE) ×3 IMPLANT
GLOVE BIOGEL PI INDICATOR 8 (GLOVE) ×2
GLOVE SURG SS PI 6.5 STRL IVOR (GLOVE) ×2 IMPLANT
GOWN STRL REUS W/TWL LRG LVL3 (GOWN DISPOSABLE) ×2 IMPLANT
GOWN STRL REUS W/TWL XL LVL3 (GOWN DISPOSABLE) ×2 IMPLANT
GUIDEWIRE BALL NOSE 100CM (WIRE) ×2 IMPLANT
HANDPIECE INTERPULSE COAX TIP (DISPOSABLE)
HEAD CERAMIC BIOLOX 36 +3 (Hips) ×1 IMPLANT
IMMOBILIZER KNEE 20 (SOFTGOODS) ×2
IMMOBILIZER KNEE 20 THIGH 36 (SOFTGOODS) IMPLANT
MANIFOLD NEPTUNE II (INSTRUMENTS) ×2 IMPLANT
MARKER SKIN DUAL TIP RULER LAB (MISCELLANEOUS) ×1 IMPLANT
NDL SAFETY ECLIPSE 18X1.5 (NEEDLE) ×1 IMPLANT
NEEDLE HYPO 18GX1.5 SHARP (NEEDLE) ×2
NS IRRIG 1000ML POUR BTL (IV SOLUTION) ×2 IMPLANT
PADDING CAST COTTON 6X4 STRL (CAST SUPPLIES) ×2 IMPLANT
PASSER SUT SWANSON 36MM LOOP (INSTRUMENTS) ×2 IMPLANT
POSITIONER SURGICAL ARM (MISCELLANEOUS) ×2 IMPLANT
PRESSURIZER FEMORAL UNIV (MISCELLANEOUS) IMPLANT
SET HNDPC FAN SPRY TIP SCT (DISPOSABLE) IMPLANT
SLEEVE DEPUY 18F XXL (Hips) ×1 IMPLANT
SPONGE LAP 18X18 X RAY DECT (DISPOSABLE) ×1 IMPLANT
SROM STM LNG 36P8L 18X13X215L ×2 IMPLANT
STAPLER VISISTAT 35W (STAPLE) ×1 IMPLANT
STEM LNG SROM 36P8L 18X13X215L IMPLANT
STRIP CLOSURE SKIN 1/2X4 (GAUZE/BANDAGES/DRESSINGS) ×2 IMPLANT
SUCTION FRAZIER HANDLE 10FR (MISCELLANEOUS) ×1
SUCTION TUBE FRAZIER 10FR DISP (MISCELLANEOUS) ×1 IMPLANT
SUT ETHIBOND NAB CT1 #1 30IN (SUTURE) ×4 IMPLANT
SUT MON AB-0 CT1 36 (SUTURE) ×1 IMPLANT
SUT STRATAFIX 0 PDS 27 VIOLET (SUTURE) ×2
SUT VIC AB 1 CT1 27 (SUTURE) ×6
SUT VIC AB 1 CT1 27XBRD ANTBC (SUTURE) ×3 IMPLANT
SUT VIC AB 1 CT1 36 (SUTURE) ×1 IMPLANT
SUT VIC AB 2-0 CT1 27 (SUTURE) ×6
SUT VIC AB 2-0 CT1 TAPERPNT 27 (SUTURE) ×3 IMPLANT
SUT VLOC 180 0 24IN GS25 (SUTURE) ×4 IMPLANT
SUTURE STRATFX 0 PDS 27 VIOLET (SUTURE) IMPLANT
SWAB COLLECTION DEVICE MRSA (MISCELLANEOUS) IMPLANT
SWAB CULTURE ESWAB REG 1ML (MISCELLANEOUS) IMPLANT
SYR 50ML LL SCALE MARK (SYRINGE) ×2 IMPLANT
TOWEL OR 17X26 10 PK STRL BLUE (TOWEL DISPOSABLE) ×4 IMPLANT
TOWER CARTRIDGE SMART MIX (DISPOSABLE) IMPLANT
TRAY FOLEY W/METER SILVER 16FR (SET/KITS/TRAYS/PACK) ×2 IMPLANT
TUBE KAMVAC SUCTION (TUBING) IMPLANT
WATER STERILE IRR 1500ML POUR (IV SOLUTION) ×3 IMPLANT
YANKAUER SUCT BULB TIP 10FT TU (MISCELLANEOUS) ×2 IMPLANT

## 2016-11-18 NOTE — H&P (Signed)
CC- [name] [name] is a 76 y.o. male who presents with left hip pain  Hip Pain: Patient complains of left hip pain. Onset of the symptoms was several months ago. Inciting event: He had a left total hip arthroplasty done several months ago and has had progressive pain and persistent limp over the past 2-3 months. He has also noticed a leg length discrepancy with shortening of the left leg. Radiographs show that the femoral stem has subsided compared to initial post-op films. He presents now for left femoral revision.  Past Medical History:  Diagnosis Date  . Arthritis   . ED (erectile dysfunction)   . Enlarged prostate with lower urinary tract symptoms (LUTS)   . GERD (gastroesophageal reflux disease)   . History of chronic gastritis   . History of colon polyps    hyperplastic 2006  . History of esophageal stricture    S/P  DILATATION 2009; 2010; 2010 2012  . History of kidney stones    hx. multiple kidney stones  . History of kidney stones   . History of primary hyperparathyroidism    s/p  left superior parathyroidectomy 07-10-2014  . Hyperlipidemia   . Hypertension   . Left ureteral stone   . Nephrolithiasis    left side   . Pre-diabetes     Past Surgical History:  Procedure Laterality Date  . COLONOSCOPY  02/17/2005  . CYSTO/  LEFT RETROGRADE PYELOGRAM/ STENT PLACEMENT  01/26/2005  . CYSTOSCOPY W/ URETEROSCOPY W/ LITHOTRIPSY Left 05/04/2005  . CYSTOSCOPY WITH STENT PLACEMENT Left 12/30/2015   Procedure: CYSTOSCOPY WITH STENT PLACEMENT;  Surgeon: Franchot Gallo, MD;  Location: [facility];  Service: Urology;  Laterality: Left;  . CYSTOSCOPY/RETROGRADE/URETEROSCOPY/STONE EXTRACTION WITH BASKET Left 12/30/2015   Procedure: CYSTOSCOPY/RETROGRADE/URETEROSCOPY/STONE EXTRACTION WITH BASKET;  Surgeon: Franchot Gallo, MD;  Location: [facility];  Service: Urology;  Laterality: Left;  . CYSTOSCOPY/URETEROSCOPY/HOLMIUM LASER/STENT PLACEMENT Left 03/09/2016    Procedure: CYSTOSCOPY/RETROGRADE PYELOGRAM/URETEROSCOPY/BASKET STONE EXTRACTION/STENT PLACEMENT;  Surgeon: Franchot Gallo, MD;  Location: [facility];  Service: Urology;  Laterality: Left;  . ESOPHAGOGASTRODUODENOSCOPY (EGD) WITH ESOPHAGEAL DILATION  x4  last one 07-21-2010  . HOLMIUM LASER APPLICATION Left 76/19/5093   Procedure: HOLMIUM LASER APPLICATION;  Surgeon: Franchot Gallo, MD;  Location: [facility];  Service: Urology;  Laterality: Left;  . INGUINAL HERNIA REPAIR Left yrs ago  . NEPHROLITHOTOMY Left 02/01/2014   Procedure: NEPHROLITHOTOMY PERCUTANEOUS;  Surgeon: Jorja Loa, MD;  Location: [facility];  Service: Urology;  Laterality: Left;  . PARATHYROIDECTOMY Left 07/10/2014   Procedure: LEFT SUPERIOR PARATHYROIDECTOMY;  Surgeon: Armandina Gemma, MD;  Location: [place];  Service: General;  Laterality: Left;  . TOTAL HIP ARTHROPLASTY Left 06/24/2016   Procedure: LEFT TOTAL HIP ARTHROPLASTY ANTERIOR APPROACH;  Surgeon: Gaynelle Arabian, MD;  Location: [facility];  Service: Orthopedics;  Laterality: Left;  . tumor ear Left age 23    topical growth behind left ear.    Prior to Admission medications   Medication Sig Start Date End Date Taking? Authorizing Provider  bisoprolol-hydrochlorothiazide (ZIAC) 5-6.25 MG tablet TAKE 1 TABLET BY MOUTH EVERY MORNING FOR BLOOD PRESSURE 11/27/15  Yes Vicie Mutters, PA-C  fenofibrate (TRICOR) 145 MG tablet TAKE 1 TABLET BY MOUTH DAILY FOR TRIGLYCERIDES/BLOOD FATS 06/01/16  Yes Unk Pinto, MD  finasteride (PROSCAR) 5 MG tablet Take 5 mg by mouth daily.   Yes [provider]  losartan (COZAAR) 100 MG tablet TAKE 1 TABLET BY MOUTH DAILY FOR BLOOD PRESSURE. 01/01/16  Yes Melford Aase,  [name], MD  omeprazole (PRILOSEC) 40 MG capsule TAKE 1 CAPSULE BY MOUTH DAILY. 11/11/16  Yes Unk Pinto, MD  tamsulosin [facility]) 0.4 MG CAPS capsule Take 0.4 mg by mouth daily after breakfast.  11/06/14  Yes [provider]  aspirin EC 81 MG tablet  Take 81 mg by mouth daily.    [provider]  B Complex-C-Folic Acid (SM B SUPER VITAMIN COMPLEX PO) Take by mouth every evening.    [provider]  Flaxseed, Linseed, (HM FLAXSEED OIL) 1000 MG CAPS Take by mouth daily.    [provider]  hydrocortisone (ANUSOL-HC) 2.5 % rectal cream Apply rectally 2 times daily 08/27/16 08/27/17  Unk Pinto, MD  hydrocortisone (ANUSOL-HC) 25 MG suppository Place 1 suppository (25 mg total) rectally every 12 (twelve) hours. 07/17/16 07/17/17  Unk Pinto, MD  magnesium oxide (MAG-OX) 400 MG tablet Take 400 mg by mouth daily.     [provider]  meloxicam (MOBIC) 15 MG tablet TAKE 1 TABLET BY MOUTH DAILY. 11/11/16   Unk Pinto, MD  methocarbamol (ROBAXIN) 500 MG tablet Take 1 tablet (500 mg total) by mouth every 6 (six) hours as needed for muscle spasms. 06/26/16   Perkins, Alexzandrew L, PA-C  Omega-3 Fatty Acids (FISH OIL) 1200 MG CAPS Take by mouth 2 (two) times daily.    [provider]  oxybutynin (DITROPAN) 5 MG tablet Take 1 tablet (5 mg total) by mouth every 8 (eight) hours as needed for bladder spasms. 03/09/16   Franchot Gallo, MD  oxyCODONE (OXY IR/ROXICODONE) 5 MG immediate release tablet Take 1-2 tablets (5-10 mg total) by mouth every 4 (four) hours as needed for moderate pain or severe pain. 06/26/16   Perkins, Alexzandrew L, PA-C  traMADol (ULTRAM) 50 MG tablet Take 1-2 tablets (50-100 mg total) by mouth every 6 (six) hours as needed for moderate pain. 06/26/16   Perkins, Alexzandrew L, PA-C  UNABLE TO FIND Vitamin d 3 5000 bid    [provider]    Physical Examination: General appearance - alert, well appearing, and in no distress Mental status - alert, oriented to person, place, and time Chest - clear to auscultation, no wheezes, rales or rhonchi, symmetric air entry Heart - normal rate, regular rhythm, normal S1, S2, no murmurs, rubs, clicks or gallops Abdomen - soft, nontender,  nondistended, no masses or organomegaly Neurological - alert, oriented, normal speech, no focal findings or movement disorder noted Left lower extremity is 1/2 inch shorter than the right lower extremity. He has pain on rotational motion of the left hip. Pulses, sensation and motor are intact. He has an antalgic gait pattern on the left.  ASSESSMENT: Loosening of left femoral component of Total Hip Arthroplasty  Plan - Left femoral vs. THA revision. Discussed procedure, risks and potential complications with the patient who elects to proceed  Dione Plover. Lisle Skillman, MD    11/18/2016, 4:38 PM

## 2016-11-18 NOTE — Anesthesia Preprocedure Evaluation (Signed)
Anesthesia Evaluation  Patient identified by MRN, date of birth, ID band Patient awake    Reviewed: Allergy & Precautions, NPO status , Patient's Chart, lab work & pertinent test results, reviewed documented beta blocker date and time   Airway Mallampati: III  TM Distance: >3 FB Neck ROM: Full    Dental no notable dental hx. (+) Teeth Intact, Caps   Pulmonary former smoker,  Snores Probable OSA   Pulmonary exam normal breath sounds clear to auscultation       Cardiovascular hypertension, Pt. on medications and Pt. on home beta blockers Normal cardiovascular exam Rhythm:Regular Rate:Normal     Neuro/Psych Numbness left groin negative psych ROS   GI/Hepatic Neg liver ROS, GERD  Medicated and Controlled,Hx/o Esophageal stenosis and stricture   Endo/Other  Obesity Hyperparathyroidism  Renal/GU Renal diseaseLeft renal calculus   BPH ED    Musculoskeletal  (+) Arthritis , Osteoarthritis,  Loose femoral component L THR   Abdominal (+) + obese,   Peds  Hematology negative hematology ROS (+)   Anesthesia Other Findings   Reproductive/Obstetrics                             Anesthesia Physical Anesthesia Plan  ASA: III  Anesthesia Plan: General   Post-op Pain Management:    Induction: Intravenous  PONV Risk Score and Plan: 3 and Ondansetron, Dexamethasone, Propofol infusion, Promethazine and Treatment may vary due to age or medical condition  Airway Management Planned: Oral ETT  Additional Equipment:   Intra-op Plan:   Post-operative Plan: Extubation in OR  Informed Consent: I have reviewed the patients History and Physical, chart, labs and discussed the procedure including the risks, benefits and alternatives for the proposed anesthesia with the patient or authorized representative who has indicated his/her understanding and acceptance.   Dental advisory given  Plan Discussed  with: Anesthesiologist, CRNA and Surgeon  Anesthesia Plan Comments:         Anesthesia Quick Evaluation

## 2016-11-18 NOTE — Anesthesia Procedure Notes (Signed)
Procedure Name: Intubation Date/Time: 11/18/2016 5:05 PM Performed by: Montel Clock Pre-anesthesia Checklist: Patient identified, Emergency Drugs available, Suction available, Patient being monitored and Timeout performed Patient Re-evaluated:Patient Re-evaluated prior to induction Oxygen Delivery Method: Circle system utilized Preoxygenation: Pre-oxygenation with 100% oxygen Induction Type: IV induction Ventilation: Mask ventilation without difficulty, Oral airway inserted - appropriate to patient size and Two handed mask ventilation required Laryngoscope Size: Mac and 3 Grade View: Grade II Tube type: Oral Tube size: 7.5 mm Number of attempts: 1 Airway Equipment and Method: Stylet Placement Confirmation: ETT inserted through vocal cords under direct vision,  positive ETCO2 and breath sounds checked- equal and bilateral Secured at: 23 cm Tube secured with: Tape Dental Injury: Teeth and Oropharynx as per pre-operative assessment  Comments: Extra hand needed for mask seal.

## 2016-11-18 NOTE — Brief Op Note (Signed)
OPerative note- A left femoral revision was performed. EBL 600 ml. There were no complications. See  Dictation [id number]) for details

## 2016-11-18 NOTE — Transfer of Care (Signed)
Immediate Anesthesia Transfer of Care Note  Patient: [name] [name]  Procedure(s) Performed: Procedure(s): Left femoral revision - posterior approach (Left)  Patient Location: PACU  Anesthesia Type:General  Level of Consciousness: awake, alert  and oriented  Airway & Oxygen Therapy: Patient Spontanous Breathing and Patient connected to face mask oxygen  Post-op Assessment: Report given to RN and Post -op Vital signs reviewed and stable  Post vital signs: Reviewed and stable  Last Vitals:  Vitals:   11/18/16 1440 11/18/16 2000  BP: 117/76 (!) 149/86  Pulse: 64 88  Resp: 18 14  Temp: 36.7 C 37 C  SpO2: 97% 96%    Last Pain:  Vitals:   11/18/16 1440  TempSrc: Oral         Complications: No apparent anesthesia complications

## 2016-11-19 ENCOUNTER — Encounter (HOSPITAL_COMMUNITY): Payer: Self-pay | Admitting: Orthopedic Surgery

## 2016-11-19 LAB — BASIC METABOLIC PANEL
Anion gap: 11 (ref 5–15)
BUN: 25 mg/dL — ABNORMAL HIGH (ref 6–20)
CO2: 21 mmol/L — ABNORMAL LOW (ref 22–32)
Calcium: 8.5 mg/dL — ABNORMAL LOW (ref 8.9–10.3)
Chloride: 101 mmol/L (ref 101–111)
Creatinine, Ser: 1.55 mg/dL — ABNORMAL HIGH (ref 0.61–1.24)
GFR calc Af Amer: 50 mL/min — ABNORMAL LOW (ref >60)
GFR calc non Af Amer: 43 mL/min — ABNORMAL LOW (ref >60)
Glucose, Bld: 176 mg/dL — ABNORMAL HIGH (ref 65–99)
Potassium: 4.1 mmol/L (ref 3.5–5.1)
Sodium: 133 mmol/L — ABNORMAL LOW (ref 135–145)

## 2016-11-19 LAB — CBC
HCT: 36.3 % — ABNORMAL LOW (ref 39.0–52.0)
Hemoglobin: 12.5 g/dL — ABNORMAL LOW (ref 13.0–17.0)
MCH: 31.2 pg (ref 26.0–34.0)
MCHC: 34.4 g/dL (ref 30.0–36.0)
MCV: 90.5 fL (ref 78.0–100.0)
Platelets: 240 10*3/uL (ref 150–400)
RBC: 4.01 MIL/uL — ABNORMAL LOW (ref 4.22–5.81)
RDW: 13.3 % (ref 11.5–15.5)
WBC: 12.4 10*3/uL — ABNORMAL HIGH (ref 4.0–10.5)

## 2016-11-19 MED ORDER — LOSARTAN POTASSIUM 50 MG PO TABS
100.0000 mg | ORAL_TABLET | Freq: Every day | ORAL | Status: DC
  Administered 2016-11-20 – 2016-11-21 (×2): 100 mg via ORAL
  Filled 2016-11-19 (×2): qty 2

## 2016-11-19 MED ORDER — OMEPRAZOLE 20 MG PO CPDR
40.0000 mg | DELAYED_RELEASE_CAPSULE | Freq: Every day | ORAL | Status: DC
  Administered 2016-11-19 – 2016-11-21 (×3): 40 mg via ORAL
  Filled 2016-11-19 (×3): qty 2

## 2016-11-19 NOTE — NC FL2 (Signed)
[place] LEVEL OF CARE SCREENING TOOL     IDENTIFICATION  Patient Name: [name] [name] Birthdate: Oct 29, 1940 Sex: male Admission Date (Current Location): 11/18/2016  [facility] and [place] Number:  Herbalist and Address:  [facility],  [place] [address] [place]      Provider Number: [id number]  Attending Physician Name and Address:  Gaynelle Arabian, MD  Relative Name and Phone Number:       Current Level of Care: Hospital Recommended Level of Care: [place] Prior Approval Number:    Date Approved/Denied:   PASRR Number:   [id number]   Discharge Plan: SNF    Current Diagnoses: Patient Active Problem List   Diagnosis Date Noted  . Failed total hip arthroplasty ([place]) 11/18/2016  . OA (osteoarthritis) of hip 06/24/2016  . CKD (chronic kidney disease) stage 3, GFR 30-59 ml/min 03/25/2015  . Snoring 03/25/2015  . Medicare annual wellness visit, subsequent 11/27/2014  . Hyperparathyroidism, primary ([place]) 07/09/2014  . Vitamin D deficiency 06/13/2013  . Prediabetes 06/13/2013  . Medication management 06/13/2013  . Hyperlipidemia   . Hypertension   . GERD (gastroesophageal reflux disease)   . Kidney stones   . Morbidly obese (34.39)   . Stricture and stenosis of esophagus 10/18/2008    Orientation RESPIRATION BLADDER Height & Weight     Self, Time, Situation, Place  Normal Continent Weight: 250 lb (113.4 kg) Height:  5\' 11"  (180.3 cm)  BEHAVIORAL SYMPTOMS/MOOD NEUROLOGICAL BOWEL NUTRITION STATUS      Continent Diet (Regular)  AMBULATORY STATUS COMMUNICATION OF NEEDS Skin   Extensive Assist Verbally Surgical wounds                       Personal Care Assistance Level of Assistance  Bathing, Feeding, Dressing Bathing Assistance: Limited assistance Feeding assistance: Independent Dressing Assistance: Limited assistance     Functional Limitations Info  Sight, Hearing, Speech Sight Info:  Adequate Hearing Info: Adequate Speech Info: Adequate    SPECIAL CARE FACTORS FREQUENCY  PT (By licensed PT), OT (By licensed OT)     PT Frequency: 7X/week OT Frequency: 7X/week            Contractures Contractures Info: Not present    Additional Factors Info  Allergies, Code Status Code Status Info: FULLCODE Allergies Info: Iohexol, Ace Inhibitors           Current Medications (11/19/2016):  This is the current hospital active medication list Current Facility-Administered Medications  Medication Dose Route Frequency Provider Last Rate Last Dose  . 0.9 %  sodium chloride infusion   Intravenous Continuous Perkins, Alexzandrew L, PA-C 50 mL/hr at 11/19/16 0932    . acetaminophen (TYLENOL) tablet 650 mg  650 mg Oral Q6H PRN [name], Pilar Plate, MD       Or  . acetaminophen (TYLENOL) suppository 650 mg  650 mg Rectal Q6H PRN [name], Pilar Plate, MD      . acetaminophen (TYLENOL) tablet 1,000 mg  1,000 mg Oral Q6H [name], Pilar Plate, MD   1,000 mg at 11/19/16 1343  . bisacodyl (DULCOLAX) suppository 10 mg  10 mg Rectal Daily PRN Gaynelle Arabian, MD      . bisoprolol-hydrochlorothiazide [facility]) 5-6.25 MG per tablet 1 tablet  1 tablet Oral Daily Gaynelle Arabian, MD   1 tablet at 11/19/16 1347  . diphenhydrAMINE (BENADRYL) 12.5 MG/5ML elixir 12.5-25 mg  12.5-25 mg Oral Q4H PRN Gaynelle Arabian, MD      . docusate  sodium (COLACE) capsule 100 mg  100 mg Oral BID Gaynelle Arabian, MD   100 mg at 11/19/16 0933  . finasteride (PROSCAR) tablet 5 mg  5 mg Oral Daily Gaynelle Arabian, MD   5 mg at 11/19/16 0932  . losartan (COZAAR) tablet 100 mg  100 mg Oral Daily Perkins, Alexzandrew L, PA-C      . magnesium oxide (MAG-OX) tablet 400 mg  400 mg Oral Daily Gaynelle Arabian, MD   400 mg at 11/19/16 0932  . menthol-cetylpyridinium (CEPACOL) lozenge 3 mg  1 lozenge Oral PRN Gaynelle Arabian, MD       Or  . phenol (CHLORASEPTIC) mouth spray 1 spray  1 spray Mouth/Throat PRN [name], Pilar Plate, MD      . methocarbamol  (ROBAXIN) tablet 500 mg  500 mg Oral Q6H PRN Gaynelle Arabian, MD       Or  . methocarbamol (ROBAXIN) 500 mg in dextrose 5 % 50 mL IVPB  500 mg Intravenous Q6H PRN Gaynelle Arabian, MD   Stopped at 11/18/16 2101  . metoCLOPramide (REGLAN) tablet 5-10 mg  5-10 mg Oral Q8H PRN Gaynelle Arabian, MD       Or  . metoCLOPramide (REGLAN) injection 5-10 mg  5-10 mg Intravenous Q8H PRN [name], Pilar Plate, MD      . omeprazole (PRILOSEC) capsule 40 mg  40 mg Oral Daily Perkins, Alexzandrew L, PA-C   40 mg at 11/19/16 0932  . ondansetron (ZOFRAN) tablet 4 mg  4 mg Oral Q6H PRN Gaynelle Arabian, MD       Or  . ondansetron (ZOFRAN) injection 4 mg  4 mg Intravenous Q6H PRN [name], Pilar Plate, MD      . oxybutynin (DITROPAN) tablet 5 mg  5 mg Oral Q8H PRN [name], Pilar Plate, MD      . oxyCODONE (Oxy IR/ROXICODONE) immediate release tablet 5-10 mg  5-10 mg Oral Q3H PRN Gaynelle Arabian, MD   10 mg at 11/19/16 1343  . polyethylene glycol (MIRALAX / GLYCOLAX) packet 17 g  17 g Oral Daily PRN [name], Pilar Plate, MD      . rivaroxaban [facility]) tablet 10 mg  10 mg Oral Q breakfast Gaynelle Arabian, MD   10 mg at 11/19/16 0932  . sodium phosphate (FLEET) 7-19 GM/118ML enema 1 enema  1 enema Rectal Once PRN [name], Pilar Plate, MD      . tamsulosin (FLOMAX) capsule 0.4 mg  0.4 mg Oral QPC breakfast Gaynelle Arabian, MD   0.4 mg at 11/19/16 0932  . traMADol (ULTRAM) tablet 50-100 mg  50-100 mg Oral Q6H PRN Gaynelle Arabian, MD         Discharge Medications: Please see discharge summary for a list of discharge medications.  Relevant Imaging Results:  Relevant Lab Results:   Additional Information ssn:[id number]  Lia Hopping, LCSW

## 2016-11-19 NOTE — Discharge Instructions (Addendum)
Dr. Gaynelle Arabian Total Joint Specialist [facility] [address] [place], [place] [address] [phone]   POSTERIOR TOTAL HIP REPLACEMENT POSTOPERATIVE DIRECTIONS  Hip Rehabilitation, Guidelines Following Surgery  The results of a hip operation are greatly improved after range of motion and muscle strengthening exercises. Follow all safety measures which are given to protect your hip. If any of these exercises cause increased pain or swelling in your joint, decrease the amount until you are comfortable again. Then slowly increase the exercises. Call your caregiver if you have problems or questions.   HOME CARE INSTRUCTIONS  Remove items at home which could result in a fall. This includes throw rugs or furniture in walking pathways.   ICE to the affected hip every three hours for 30 minutes at a time and then as needed for pain and swelling.  Continue to use ice on the hip for pain and swelling from surgery. You may notice swelling that will progress down to the foot and ankle.  This is normal after surgery.  Elevate the leg when you are not up walking on it.    Continue to use the breathing machine which will help keep your temperature down.  It is common for your temperature to cycle up and down following surgery, especially at night when you are not up moving around and exerting yourself.  The breathing machine keeps your lungs expanded and your temperature down.  DIET You may resume your previous home diet once your are discharged from the hospital.  DRESSING / WOUND CARE / SHOWERING You may shower 3 days after surgery, but keep the wounds dry during showering.  You may use an occlusive plastic wrap (Press'n Seal for example), NO SOAKING/SUBMERGING IN THE BATHTUB.  If the bandage gets wet, change with a clean dry gauze.  If the incision gets wet, pat the wound dry with a clean towel. You may start showering once you are discharged home but do not submerge  the incision under water. Just pat the incision dry and apply a dry gauze dressing on daily. Change the surgical dressing daily and reapply a dry dressing each time.    ACTIVITY Walk with your walker as instructed. Use walker as long as suggested by your caregivers. Avoid periods of inactivity such as sitting longer than an hour when not asleep. This helps prevent blood clots.  You may resume a sexual relationship in one month or when given the OK by your doctor.  You may return to work once you are cleared by your doctor.  Do not drive a car for 6 weeks or until released by you surgeon.  Do not drive while taking narcotics.  WEIGHT BEARING Weight bearing as tolerated with assist device (walker, cane, etc) as directed, use it as long as suggested by your surgeon or therapist, typically at least 4-6 weeks.  POSTOPERATIVE CONSTIPATION PROTOCOL Constipation - defined medically as fewer than three stools per week and severe constipation as less than one stool per week.  One of the most common issues patients have following surgery is constipation.  Even if you have a regular bowel pattern at home, your normal regimen is likely to be disrupted due to multiple reasons following surgery.  Combination of anesthesia, postoperative narcotics, change in appetite and fluid intake all can affect your bowels.  In order to avoid complications following surgery, here are some recommendations in order to help you during your recovery period.  Colace (docusate) - Pick up an over-the-counter  form of Colace or another stool softener and take twice a day as long as you are requiring postoperative pain medications.  Take with a full glass of water daily.  If you experience loose stools or diarrhea, hold the colace until you stool forms back up.  If your symptoms do not get better within 1 week or if they get worse, check with your doctor.  Dulcolax (bisacodyl) - Pick up over-the-counter and take as directed by the  product packaging as needed to assist with the movement of your bowels.  Take with a full glass of water.  Use this product as needed if not relieved by Colace only.   MiraLax (polyethylene glycol) - Pick up over-the-counter to have on hand.  MiraLax is a solution that will increase the amount of water in your bowels to assist with bowel movements.  Take as directed and can mix with a glass of water, juice, soda, coffee, or tea.  Take if you go more than two days without a movement. Do not use MiraLax more than once per day. Call your doctor if you are still constipated or irregular after using this medication for 7 days in a row.  If you continue to have problems with postoperative constipation, please contact the office for further assistance and recommendations.  If you experience "the worst abdominal pain ever" or develop nausea or vomiting, please contact the office immediatly for further recommendations for treatment.  ITCHING  If you experience itching with your medications, try taking only a single pain pill, or even half a pain pill at a time.  You can also use Benadryl over the counter for itching or also to help with sleep.   TED HOSE STOCKINGS Wear the elastic stockings on both legs for three weeks following surgery during the day but you may remove then at night for sleeping.  MEDICATIONS See your medication summary on the After Visit Summary that the nursing staff will review with you prior to discharge.  You may have some home medications which will be placed on hold until you complete the course of blood thinner medication.  It is important for you to complete the blood thinner medication as prescribed by your surgeon.  Continue your approved medications as instructed at time of discharge.  PRECAUTIONS If you experience chest pain or shortness of breath - call 911 immediately for transfer to the hospital emergency department.  If you develop a fever greater that 101 F, purulent  drainage from wound, increased redness or drainage from wound, foul odor from the wound/dressing, or calf pain - CONTACT YOUR SURGEON.                                                   FOLLOW-UP APPOINTMENTS Make sure you keep all of your appointments after your operation with your surgeon and caregivers. You should call the office at the above phone number and make an appointment for approximately two weeks after the date of your surgery or on the date instructed by your surgeon outlined in the "After Visit Summary".  RANGE OF MOTION AND STRENGTHENING EXERCISES  These exercises are designed to help you keep full movement of your hip joint. Follow your caregiver's or physical therapist's instructions. Perform all exercises about fifteen times, three times per day or as directed. Exercise both hips, even if you  have had only one joint replacement. These exercises can be done on a training (exercise) mat, on the floor, on a table or on a bed. Use whatever works the best and is most comfortable for you. Use music or television while you are exercising so that the exercises are a pleasant break in your day. This will make your life better with the exercises acting as a break in routine you can look forward to.  Lying on your back, slowly slide your foot toward your buttocks, raising your knee up off the floor. Then slowly slide your foot back down until your leg is straight again.  Lying on your back spread your legs as far apart as you can without causing discomfort.  Lying on your side, raise your upper leg and foot straight up from the floor as far as is comfortable. Slowly lower the leg and repeat.  Lying on your back, tighten up the muscle in the front of your thigh (quadriceps muscles). You can do this by keeping your leg straight and trying to raise your heel off the floor. This helps strengthen the largest muscle supporting your knee.  Lying on your back, tighten up the muscles of your buttocks both  with the legs straight and with the knee bent at a comfortable angle while keeping your heel on the floor.      IF YOU ARE TRANSFERRED TO A SKILLED REHAB FACILITY If the patient is transferred to a skilled rehab facility following release from the hospital, a list of the current medications will be sent to the facility for the patient to continue.  When discharged from the skilled rehab facility, please have the facility set up the patient's [place] prior to being released. Also, the skilled facility will be responsible for providing the patient with their medications at time of release from the facility to include their pain medication, the muscle relaxants, and their blood thinner medication. If the patient is still at the rehab facility at time of the two week follow up appointment, the skilled rehab facility will also need to assist the patient in arranging follow up appointment in our office and any transportation needs.  MAKE SURE YOU:  Understand these instructions.  Get help right away if you are not doing well or get worse.    Pick up stool softner and laxative for home use following surgery while on pain medications. Do not submerge incision under water. Please use good hand washing techniques while changing dressing each day. May shower starting three days after surgery. Please use a clean towel to pat the incision dry following showers. Continue to use ice for pain and swelling after surgery. Do not use any lotions or creams on the incision until instructed by your surgeon.  Take Xarelto for two and a half more weeks following discharge from the hospital, then discontinue Xarelto. Once the patient has completed the Xarelto, they may resume the 81 mg Aspirin.    Information on my medicine - XARELTO (Rivaroxaban)  Why was Xarelto prescribed for you? Xarelto was prescribed for you to reduce the risk of blood clots forming after orthopedic surgery. The  medical term for these abnormal blood clots is venous thromboembolism (VTE).  What do you need to know about xarelto ? Take your Xarelto ONCE DAILY at the same time every day. You may take it either with or without food.  If you have difficulty swallowing the tablet whole, you may crush it and mix in  applesauce just prior to taking your dose.  Take Xarelto exactly as prescribed by your doctor and DO NOT stop taking Xarelto without talking to the doctor who prescribed the medication.  Stopping without other VTE prevention medication to take the place of Xarelto may increase your risk of developing a clot.  After discharge, you should have regular check-up appointments with your healthcare provider that is prescribing your Xarelto.    What do you do if you miss a dose? If you miss a dose, take it as soon as you remember on the same day then continue your regularly scheduled once daily regimen the next day. Do not take two doses of Xarelto on the same day.   Important Safety Information A possible side effect of Xarelto is bleeding. You should call your healthcare provider right away if you experience any of the following: ? Bleeding from an injury or your nose that does not stop. ? Unusual colored urine (red or dark brown) or unusual colored stools (red or black). ? Unusual bruising for unknown reasons. ? A serious fall or if you hit your head (even if there is no bleeding).  Some medicines may interact with Xarelto and might increase your risk of bleeding while on Xarelto. To help avoid this, consult your healthcare provider or pharmacist prior to using any new prescription or non-prescription medications, including herbals, vitamins, non-steroidal anti-inflammatory drugs (NSAIDs) and supplements.  This website has more information on Xarelto: [email or web address]

## 2016-11-19 NOTE — Progress Notes (Signed)
   Subjective: 1 Day Post-Op Procedure(s) (LRB): Left femoral revision - posterior approach (Left) Patient reports pain as mild.   Patient seen in rounds by Dr. Wynelle Link. Patient is well, but has had some minor complaints of pain in the hip, requiring pain medications We will start therapy today.  Plan is to go SNF after hospital stay.  Objective: Vital signs in last 24 hours: Temp:  [97.5 F (36.4 C)-98.6 F (37 C)] 97.6 F (36.4 C) (09/20 0540) Pulse Rate:  [62-95] 67 (09/20 0540) Resp:  [8-19] 16 (09/20 0540) BP: (103-155)/(62-99) 120/78 (09/20 0540) SpO2:  [89 %-100 %] 98 % (09/20 0540) Weight:  [113.4 kg (250 lb)] 113.4 kg (250 lb) (09/19 1504)  Intake/Output from previous day:  Intake/Output Summary (Last 24 hours) at 11/19/16 0827 Last data filed at 11/19/16 0609  Gross per 24 hour  Intake          4230.83 ml  Output             1715 ml  Net          2515.83 ml    Intake/Output this shift: No intake/output data recorded.  Labs:  Recent Labs  11/19/16 0527  HGB 12.5*    Recent Labs  11/19/16 0527  WBC 12.4*  RBC 4.01*  HCT 36.3*  PLT 240    Recent Labs  11/19/16 0527  NA 133*  K 4.1  CL 101  CO2 21*  BUN 25*  CREATININE 1.55*  GLUCOSE 176*  CALCIUM 8.5*   No results for input(s): LABPT, INR in the last 72 hours.  EXAM General - Patient is Alert and Appropriate Extremity - Neurovascular intact Sensation intact distally Dressing - dressing C/D/I Motor Function - intact, moving foot and toes well on exam.  Hemovac pulled without difficulty.  Past Medical History:  Diagnosis Date  . Arthritis   . ED (erectile dysfunction)   . Enlarged prostate with lower urinary tract symptoms (LUTS)   . GERD (gastroesophageal reflux disease)   . History of chronic gastritis   . History of colon polyps    hyperplastic 2006  . History of esophageal stricture    S/P  DILATATION 2009; 2010; 2010 2012  . History of kidney stones    hx. multiple kidney  stones  . History of kidney stones   . History of primary hyperparathyroidism    s/p  left superior parathyroidectomy 07-10-2014  . Hyperlipidemia   . Hypertension   . Left ureteral stone   . Nephrolithiasis    left side   . Pre-diabetes     Assessment/Plan: 1 Day Post-Op Procedure(s) (LRB): Left femoral revision - posterior approach (Left) Principal Problem:   Failed total hip arthroplasty (HCC)  Estimated body mass index is 34.87 kg/m as calculated from the following:   Height as of this encounter: 5\' 11"  (1.803 m).   Weight as of this encounter: 113.4 kg (250 lb). Advance diet Up with therapy Plan for discharge tomorrow Discharge to SNF  DVT Prophylaxis - Xarelto Weight Bearing As Tolerated left Leg D/C Knee Immobilizer Hemovac Pulled Begin Therapy Hip Preacutions  Arlee Muslim, PA-C Orthopaedic Surgery 11/19/2016, 8:27 AM

## 2016-11-19 NOTE — Clinical Social Work Note (Signed)
Clinical Social Work Assessment  Patient Details  Name: [name] [name] MRN: [id number] Date of Birth: [birth year]  Date of referral:  11/19/16               Reason for consult:  Facility Placement                Permission sought to share information with:  Family Supports, Chartered certified accountant granted to share information::  Yes, Verbal Permission Granted  Name::        Agency::  [facility]  Relationship::  Spouse and Adult Children  Contact Information:     Housing/Transportation Living arrangements for the past 2 months:  Single Family Home Source of Information:  Patient Patient Interpreter Needed:  None Criminal Activity/Legal Involvement Pertinent to Current Situation/Hospitalization:  No - Comment as needed Significant Relationships:  Adult Children, Spouse Lives with:  Spouse Do you feel safe going back to the place where you live?  Yes Need for family participation in patient care:  Yes (Comment)  Care giving concerns:  SNF Placement for short term rehab   Social Worker assessment / plan:  CSW met with patient at bedside, explain role and reason for visit-assist with SNF placement. Patient and family have decided on [facility] for short rehab then later going to community PT. Patient reports he is hoping for a quick turn and ready to be active again. Patient reports this is his second procedure and is familiar with the process. Patient reports his spouse has been in contact w/ liaison at facility.  PLAN: Complete fl2/faxout and provide offers.    Employment status:  Aeronautical engineer:    PT Recommendations:  [place] / Referral to community resources:  [place]  Patient/Family's Response to care: Agreeable and very involved in care. Patient appreciative of CSW services.   Patient/Family's Understanding of and Emotional Response to Diagnosis, Current Treatment, and Prognosis:  " The  medication is helping with the pain, I just hop things continue to go smoothly at SNF. My family keeps calling the front desk to check on me." Patient reports wife and adult children involved in care and will assist with his transition to rehab then home.   Emotional Assessment Appearance:    Attitude/Demeanor/Rapport:    Affect (typically observed):  Accepting, Pleasant Orientation:  Oriented to Self, Oriented to Place, Oriented to  Time, Oriented to Situation Alcohol / Substance use:  Not Applicable Psych involvement (Current and /or in the community):  No (Comment)  Discharge Needs  Concerns to be addressed:  Discharge Planning Concerns, Care Coordination Readmission within the last 30 days:    Current discharge risk:  None Barriers to Discharge:  Continued Medical Work up   [employer], [place] 11/19/2016, 2:03 PM

## 2016-11-19 NOTE — Evaluation (Signed)
Physical Therapy Evaluation Patient Details Name: [name] [name] MRN: [id number] DOB: [birth year] Today's Date: 11/19/2016   History of Present Illness  Pt s/p revision of femoral component of recent THR (4/18)  Clinical Impression  Pt s/p L THR revision and presents with decreased L LE strength/ROM, post op pain, and posterior THP limiting functional mobility.  Pt would benefit from follow up rehab at SNF level to maximize IND and safety prior to return home with ltd assist.    Follow Up Recommendations SNF    Equipment Recommendations  None recommended by PT    Recommendations for Other Services OT consult     Precautions / Restrictions Precautions Precautions: Fall;Posterior Hip Precaution Booklet Issued: Yes (comment) Restrictions Weight Bearing Restrictions: No Other Position/Activity Restrictions: WBAT      Mobility  Bed Mobility Overal bed mobility: Needs Assistance Bed Mobility: Supine to Sit     Supine to sit: Mod assist     General bed mobility comments: cues for sequence and adherence to THP.  Physical assist to manage L LE and to bring trunk to upright  Transfers Overall transfer level: Needs assistance Equipment used: Rolling walker (2 wheeled) Transfers: Sit to/from Stand Sit to Stand: Min assist;From elevated surface         General transfer comment: cues for LE management and use of UEs to self assist  Ambulation/Gait Ambulation/Gait assistance: Min assist Ambulation Distance (Feet): 65 Feet Assistive device: Rolling walker (2 wheeled) Gait Pattern/deviations: Step-to pattern;Decreased step length - right;Decreased step length - left;Shuffle;Trunk flexed Gait velocity: decr Gait velocity interpretation: Below normal speed for age/gender General Gait Details: cues for sequence, posture, position from RW and ER on L  Stairs            Wheelchair Mobility    Modified Rankin (Stroke Patients Only)       Balance                                              Pertinent Vitals/Pain Pain Assessment: 0-10 Pain Score: 6  Pain Location: L hip Pain Descriptors / Indicators: Aching;Sore Pain Intervention(s): Limited activity within patient's tolerance;Monitored during session;Premedicated before session;Ice applied    Home Living Family/patient expects to be discharged to:: Skilled nursing facility                      Prior Function Level of Independence: Independent;Independent with assistive device(s)         Comments: wife is unsteady and uses cane     Hand Dominance        Extremity/Trunk Assessment   Upper Extremity Assessment Upper Extremity Assessment: Overall WFL for tasks assessed    Lower Extremity Assessment Lower Extremity Assessment: LLE deficits/detail LLE Deficits / Details: Strength at hip 2+/5 with AAROM at hip to 75 flex and 15 abd    Cervical / Trunk Assessment Cervical / Trunk Assessment: Normal  Communication   Communication: No difficulties  Cognition Arousal/Alertness: Awake/alert Behavior During Therapy: WFL for tasks assessed/performed Overall Cognitive Status: Within Functional Limits for tasks assessed                                        General Comments      Exercises Total Joint Exercises Ankle  Circles/Pumps: AROM;Both;15 reps;Supine Quad Sets: AROM;Both;10 reps;Supine Heel Slides: AAROM;Left;20 reps;Supine Hip ABduction/ADduction: AAROM;Left;15 reps;Supine   Assessment/Plan    PT Assessment Patient needs continued PT services  PT Problem List Decreased strength;Decreased range of motion;Decreased activity tolerance;Decreased mobility;Decreased knowledge of use of DME;Pain;Obesity       PT Treatment Interventions DME instruction;Gait training;Stair training;Functional mobility training;Therapeutic activities;Therapeutic exercise;Patient/family education    PT Goals (Current goals can be found in the Care Plan  section)  Acute Rehab PT Goals Patient Stated Goal: Rehab to regain strength and IND  PT Goal Formulation: With patient Time For Goal Achievement: 11/23/16 Potential to Achieve Goals: Good    Frequency 7X/week   Barriers to discharge        Co-evaluation               AM-PAC PT "6 Clicks" Daily Activity  Outcome Measure Difficulty turning over in bed (including adjusting bedclothes, sheets and blankets)?: Unable Difficulty moving from lying on back to sitting on the side of the bed? : Unable Difficulty sitting down on and standing up from a chair with arms (e.g., wheelchair, bedside commode, etc,.)?: Unable Help needed moving to and from a bed to chair (including a wheelchair)?: A Lot Help needed walking in hospital room?: A Little Help needed climbing 3-5 steps with a railing? : A Lot 6 Click Score: 10    End of Session Equipment Utilized During Treatment: Gait belt Activity Tolerance: Patient tolerated treatment well;Patient limited by pain Patient left: in chair;with call bell/phone within reach Nurse Communication: Mobility status PT Visit Diagnosis: Unsteadiness on feet (R26.81);Difficulty in walking, not elsewhere classified (R26.2)    Time: 4970-2637 PT Time Calculation (min) (ACUTE ONLY): 38 min   Charges:     PT Treatments $Gait Training: 8-22 mins $Therapeutic Exercise: 8-22 mins   PT G Codes:        Pg [phone]   [name] [name] 11/19/2016, 12:46 PM

## 2016-11-19 NOTE — Op Note (Signed)
NAME:  [name] [name], HAYASHI NO.:  [id number]  MEDICAL RECORD NO.:  [id number]  LOCATION:                                 FACILITY:  PHYSICIAN:  Gaynelle Arabian, M.D.         DATE OF BIRTH:  DATE OF PROCEDURE:  11/18/2016 DATE OF DISCHARGE:                              OPERATIVE REPORT   PREOPERATIVE DIAGNOSIS:  Loose left femoral component of total hip arthroplasty.  POSTOPERATIVE DIAGNOSIS:  Loose left femoral component of total hip arthroplasty.  PROCEDURE:  Left femoral revision.  SURGEON:  Gaynelle Arabian, M.D.  ASSISTANT:  Alexzandrew L. Perkins, P.A.C.  ANESTHESIA:  Spinal.  ESTIMATED BLOOD LOSS:  600.  DRAIN:  Hemovac x1.  COMPLICATIONS:  None.  CONDITION:  Stable to recovery.  BRIEF CLINICAL NOTE:  Mr. Ishaq is a 76 year old male who had a left total hip arthroplasty done several months ago.  He had progressive pain and a limp.  We noted that his leg lengths had shortened.  An x-ray showed subsidence of the femoral stem.  He presents now for left femoral revision.  PROCEDURE IN DETAIL:  After successful administration of spinal anesthetic, the patient was placed in a right lateral decubitus position with the left side up and held with the hip positioner.  Left lower extremity was isolated from his perineum with plastic drapes and prepped and draped in the usual sterile fashion.  Posterolateral incision was made with a 10 blade through the subcutaneous tissue to the fascia lata which was incised in line with the skin incision.  Sciatic nerve was palpated and protected, and then the short rotators and capsule were isolated off the femur.  The hip was dislocated.  The stem had subsided close to an inch.  The femoral head was removed from the stem.  The loop extractor was placed around the femoral neck, and the stem was not moving at all.  I cleared out any soft tissue and bone proximally that potentially could have blocked it.  I then used flexible  osteotomes to disrupt the interface between the stem and bone proximally.  After about 45 minutes of doing this, I still was unable to budge the stem.  I felt as though I would have to make a cortical window in the lateral cortex of the femur in order to disrupt the interface distally.  I then incised the fascia of the vastus lateralis and I elevated the muscle to get to the lateral cortex of the femur.  A cortical window was then created, and I elevated up the window.  The femur was well-fixed distally.  I used flexible osteotomes to disrupt the interface between the component and femoral bone.  Eventually, we got to the point where I disrupted enough where I was able to extract the stem.  The femur did not fracture.  I replaced the cortical window and fixed it with 3 Zimmer cables.  The cables were tightened and then crimped.  The excess cable was cut and removed.  Proximally, we had excellent bone, so I felt this will be a perfect situation for a long S-ROM stem.  I placed a ball tipped  guidewire down the femoral canal and then started reaming at 10 mm, all the way up to 14.5 mm with placement of a 13 mm long stem.  Proximally reamed up to an 56F and machined the sleeve to an extra extra large.  The 56F extra extra large trial sleeve was placed with an 18 x 13 long bowed stem with a 36 +8 neck.  We were in about 20 degrees of anteversion.  A 36 +3 head was placed and the hip was reduced with outstanding stability.  There was full extension, full external rotation, 70 degrees of flexion, 40 degrees of adduction, and 90 degrees of internal rotation, 90 degrees of flexion, and 70 degrees of internal rotation.  By placing the left leg on top of the right, we equalized the leg lengths.  The hip was dislocated and the trials were removed.  The permanent 56F extra large sleeve was placed, then an 18 x 13 long stem with a 36 +8 neck was placed.  It impacted in about 20 degrees of  anteversion.  A 36 +3 ceramic head was placed and the hip was reduced with the same stability parameters.  The wound was copiously irrigated with saline solution and short rotators and capsule reattached to the femur through drill holes with Ethibond suture.  Fascia lata was closed over Hemovac drain after I closed the fascia of the vastus lateralis.  The fascia lata was closed with a running #1 V-Loc suture.  Deep subcu was closed with another running V-Loc suture, then subcu closed superficially with interrupted 2- 0 Vicryl and subcuticular was closed with a 4-0 Monocryl.  The incision was then cleaned and dried and Steri-Strips and a bulky sterile dressing applied.  The drain was hooked to suction, and the patient was then awakened and transported to recovery in stable condition.  Note that a surgical assistant was a medical necessity for this procedure.  Assistant was necessary for retraction of vital neurovascular structures and for proper positioning of the limb, for safe removal of the old prosthesis, and for safe and accurate placement of the new prosthesis.     Gaynelle Arabian, M.D.     FA/MEDQ  D:  11/18/2016  T:  11/19/2016  Job:  [id number]

## 2016-11-19 NOTE — Progress Notes (Signed)
Physical Therapy Treatment Patient Details Name: [name] [name] MRN: [id number] DOB: [birth year] Today's Date: 11/19/2016    History of Present Illness Pt s/p revision of femoral component of recent THR (4/18)    PT Comments    Pt progressing steadily with mobility and demonstrating good awareness of THP   Follow Up Recommendations  SNF     Equipment Recommendations  None recommended by PT    Recommendations for Other Services OT consult     Precautions / Restrictions Precautions Precautions: Fall;Posterior Hip Precaution Booklet Issued: Yes (comment) Precaution Comments: Pt recalls 2/3 THP without cues Restrictions Weight Bearing Restrictions: No Other Position/Activity Restrictions: WBAT    Mobility  Bed Mobility               General bed mobility comments: Pt OOB and requests back to chair  Transfers Overall transfer level: Needs assistance Equipment used: Rolling walker (2 wheeled) Transfers: Sit to/from Stand Sit to Stand: Min assist         General transfer comment: cues for LE management and use of UEs to self assist  Ambulation/Gait Ambulation/Gait assistance: Min assist Ambulation Distance (Feet): 65 Feet (twice) Assistive device: Rolling walker (2 wheeled) Gait Pattern/deviations: Step-to pattern;Decreased step length - right;Decreased step length - left;Shuffle;Trunk flexed Gait velocity: decr Gait velocity interpretation: Below normal speed for age/gender General Gait Details: cues for sequence, posture, position from RW and ER on L   Stairs            Wheelchair Mobility    Modified Rankin (Stroke Patients Only)       Balance                                            Cognition Arousal/Alertness: Awake/alert Behavior During Therapy: WFL for tasks assessed/performed Overall Cognitive Status: Within Functional Limits for tasks assessed                                        Exercises  Total Joint Exercises Ankle Circles/Pumps: AROM;Both;15 reps;Supine    General Comments        Pertinent Vitals/Pain Pain Assessment: 0-10 Pain Score: 6  Pain Location: L hip Pain Descriptors / Indicators: Aching;Sore Pain Intervention(s): Limited activity within patient's tolerance;Monitored during session;Premedicated before session;Ice applied    Home Living                      Prior Function            PT Goals (current goals can now be found in the care plan section) Acute Rehab PT Goals Patient Stated Goal: Rehab to regain strength and IND  PT Goal Formulation: With patient Time For Goal Achievement: 11/23/16 Potential to Achieve Goals: Good Progress towards PT goals: Progressing toward goals    Frequency    7X/week      PT Plan Current plan remains appropriate    Co-evaluation              AM-PAC PT "6 Clicks" Daily Activity  Outcome Measure  Difficulty turning over in bed (including adjusting bedclothes, sheets and blankets)?: Unable Difficulty moving from lying on back to sitting on the side of the bed? : Unable Difficulty sitting down on and standing up from a chair with  arms (e.g., wheelchair, bedside commode, etc,.)?: Unable Help needed moving to and from a bed to chair (including a wheelchair)?: A Lot Help needed walking in hospital room?: A Little Help needed climbing 3-5 steps with a railing? : A Lot 6 Click Score: 10    End of Session Equipment Utilized During Treatment: Gait belt Activity Tolerance: Patient tolerated treatment well;Patient limited by pain Patient left: in chair;with call bell/phone within reach Nurse Communication: Mobility status PT Visit Diagnosis: Unsteadiness on feet (R26.81);Difficulty in walking, not elsewhere classified (R26.2)     Time: 1102-1117 PT Time Calculation (min) (ACUTE ONLY): 25 min  Charges:  $Gait Training: 23-37 mins                    G Codes:       Pg [phone]  [phone]    Meade Hogeland 11/19/2016, 5:00 PM

## 2016-11-20 LAB — CBC
HCT: 34.6 % — ABNORMAL LOW (ref 39.0–52.0)
Hemoglobin: 12 g/dL — ABNORMAL LOW (ref 13.0–17.0)
MCH: 31.3 pg (ref 26.0–34.0)
MCHC: 34.7 g/dL (ref 30.0–36.0)
MCV: 90.3 fL (ref 78.0–100.0)
Platelets: 248 10*3/uL (ref 150–400)
RBC: 3.83 MIL/uL — ABNORMAL LOW (ref 4.22–5.81)
RDW: 13.4 % (ref 11.5–15.5)
WBC: 16.8 10*3/uL — ABNORMAL HIGH (ref 4.0–10.5)

## 2016-11-20 LAB — BASIC METABOLIC PANEL
Anion gap: 9 (ref 5–15)
BUN: 22 mg/dL — ABNORMAL HIGH (ref 6–20)
CO2: 25 mmol/L (ref 22–32)
Calcium: 9.4 mg/dL (ref 8.9–10.3)
Chloride: 103 mmol/L (ref 101–111)
Creatinine, Ser: 1.27 mg/dL — ABNORMAL HIGH (ref 0.61–1.24)
GFR calc Af Amer: >60 mL/min (ref >60)
GFR calc non Af Amer: 54 mL/min — ABNORMAL LOW (ref >60)
Glucose, Bld: 141 mg/dL — ABNORMAL HIGH (ref 65–99)
Potassium: 4.8 mmol/L (ref 3.5–5.1)
Sodium: 137 mmol/L (ref 135–145)

## 2016-11-20 MED ORDER — ACETAMINOPHEN 325 MG PO TABS: 650.0000 mg | ORAL_TABLET | Freq: Four times a day (QID) | ORAL | 0 refills | Status: DC | PRN

## 2016-11-20 MED ORDER — DIPHENHYDRAMINE HCL 12.5 MG/5ML PO ELIX: 12.5000 mg | ORAL_SOLUTION | ORAL | 0 refills | Status: DC | PRN

## 2016-11-20 MED ORDER — TRAMADOL HCL 50 MG PO TABS: 50.0000 mg | ORAL_TABLET | Freq: Four times a day (QID) | ORAL | 0 refills | Status: DC | PRN

## 2016-11-20 MED ORDER — POLYETHYLENE GLYCOL 3350 17 G PO PACK: 17.0000 g | PACK | Freq: Every day | ORAL | 0 refills | Status: DC | PRN

## 2016-11-20 MED ORDER — ONDANSETRON HCL 4 MG PO TABS: 4.0000 mg | ORAL_TABLET | Freq: Four times a day (QID) | ORAL | 0 refills | Status: DC | PRN

## 2016-11-20 MED ORDER — BISACODYL 10 MG RE SUPP: 10.0000 mg | Freq: Every day | RECTAL | 0 refills | Status: DC | PRN

## 2016-11-20 MED ORDER — RIVAROXABAN 10 MG PO TABS: 10.0000 mg | ORAL_TABLET | Freq: Every day | ORAL | 0 refills | Status: DC

## 2016-11-20 MED ORDER — OXYCODONE HCL 5 MG PO TABS: 5.0000 mg | ORAL_TABLET | ORAL | 0 refills | Status: DC | PRN

## 2016-11-20 MED ORDER — METHOCARBAMOL 500 MG PO TABS: 500.0000 mg | ORAL_TABLET | Freq: Four times a day (QID) | ORAL | 0 refills | Status: DC | PRN

## 2016-11-20 MED ORDER — DOCUSATE SODIUM 100 MG PO CAPS: 100.0000 mg | ORAL_CAPSULE | Freq: Two times a day (BID) | ORAL | 0 refills | Status: DC

## 2016-11-20 MED ORDER — FLEET ENEMA 7-19 GM/118ML RE ENEM: 1.0000 | ENEMA | Freq: Once | RECTAL | 0 refills | Status: DC | PRN

## 2016-11-20 NOTE — Anesthesia Postprocedure Evaluation (Signed)
Anesthesia Post Note  Patient: LEODAN BOLYARD  Procedure(s) Performed: Procedure(s) (LRB): Left femoral revision - posterior approach (Left)     Patient location during evaluation: PACU Anesthesia Type: General Level of consciousness: awake and alert Pain management: pain level controlled Vital Signs Assessment: post-procedure vital signs reviewed and stable Respiratory status: spontaneous breathing, nonlabored ventilation and respiratory function stable Cardiovascular status: blood pressure returned to baseline and stable Postop Assessment: no apparent nausea or vomiting Anesthetic complications: no    Last Vitals:  Vitals:   11/19/16 2043 11/20/16 0510  BP: 121/73 108/71  Pulse: 78 72  Resp: 17 16  Temp: 36.6 C 36.6 C  SpO2: 100% 100%    Last Pain:  Vitals:   11/20/16 0510  TempSrc: Oral  PainSc:                  Jansen Goodpasture,W. EDMOND

## 2016-11-20 NOTE — Progress Notes (Signed)
Physical Therapy Treatment Patient Details Name: [name] [name] MRN: [id number] DOB: [birth year] Today's Date: 11/20/2016    History of Present Illness Pt s/p revision of femoral component of recent THR (4/18)    PT Comments    Pt continues motivated and progressing steadily with mobility.  Pt eager for dc to rehab setting tomorrow.   Follow Up Recommendations  SNF     Equipment Recommendations  None recommended by PT    Recommendations for Other Services OT consult     Precautions / Restrictions Precautions Precautions: Fall;Posterior Hip Precaution Booklet Issued: Yes (comment) Precaution Comments: Pt recalls THP without cues Restrictions Weight Bearing Restrictions: No Other Position/Activity Restrictions: WBAT    Mobility  Bed Mobility               General bed mobility comments: Pt OOB and requests back to chair  Transfers Overall transfer level: Needs assistance Equipment used: Rolling walker (2 wheeled) Transfers: Sit to/from Stand Sit to Stand: Min guard         General transfer comment: cues for LE management and use of UEs to self assist  Ambulation/Gait Ambulation/Gait assistance: Min guard Ambulation Distance (Feet): 160 Feet (and 15' back from bathroom) Assistive device: Rolling walker (2 wheeled) Gait Pattern/deviations: Step-to pattern;Decreased step length - right;Decreased step length - left;Shuffle;Trunk flexed Gait velocity: decr Gait velocity interpretation: Below normal speed for age/gender General Gait Details: cues for posture, position from RW and ER on L   Stairs            Wheelchair Mobility    Modified Rankin (Stroke Patients Only)       Balance                                            Cognition Arousal/Alertness: Awake/alert Behavior During Therapy: WFL for tasks assessed/performed Overall Cognitive Status: Within Functional Limits for tasks assessed                                        Exercises Total Joint Exercises Ankle Circles/Pumps: AROM;Both;15 reps;Supine Quad Sets: AROM;Both;10 reps;Supine Heel Slides: AAROM;Left;20 reps;Supine Hip ABduction/ADduction: AAROM;Left;15 reps;Supine    General Comments        Pertinent Vitals/Pain Pain Assessment: 0-10 Pain Score: 6  Pain Location: L hip Pain Descriptors / Indicators: Aching;Sore Pain Intervention(s): Limited activity within patient's tolerance;Monitored during session;Premedicated before session;Ice applied    Home Living                      Prior Function            PT Goals (current goals can now be found in the care plan section) Acute Rehab PT Goals Patient Stated Goal: Rehab to regain strength and IND  PT Goal Formulation: With patient Time For Goal Achievement: 11/23/16 Potential to Achieve Goals: Good Progress towards PT goals: Progressing toward goals    Frequency    7X/week      PT Plan Current plan remains appropriate    Co-evaluation              AM-PAC PT "6 Clicks" Daily Activity  Outcome Measure  Difficulty turning over in bed (including adjusting bedclothes, sheets and blankets)?: Unable Difficulty moving from lying on back to sitting  on the side of the bed? : Unable Difficulty sitting down on and standing up from a chair with arms (e.g., wheelchair, bedside commode, etc,.)?: Unable Help needed moving to and from a bed to chair (including a wheelchair)?: A Little Help needed walking in hospital room?: A Little Help needed climbing 3-5 steps with a railing? : A Lot 6 Click Score: 11    End of Session Equipment Utilized During Treatment: Gait belt Activity Tolerance: Patient tolerated treatment well Patient left: in chair;with call bell/phone within reach Nurse Communication: Mobility status PT Visit Diagnosis: Unsteadiness on feet (R26.81);Difficulty in walking, not elsewhere classified (R26.2)     Time: 3704-8889 PT Time  Calculation (min) (ACUTE ONLY): 22 min  Charges:  $Gait Training: 8-22 mins $Therapeutic Exercise: 8-22 mins                    G Codes:       Pg [phone]    Cieanna Stormes 11/20/2016, 3:25 PM

## 2016-11-20 NOTE — Progress Notes (Signed)
Subjective: 2 Days Post-Op Procedure(s) (LRB): Left femoral revision - posterior approach (Left) Patient reports pain as mild.   Patient seen in rounds with Dr. Wynelle Link.  Sitting up in the chair already this morning. Plan for [facility] in [place].  Probably tomorrow. Patient is well, but has had some minor complaints of pain in the hip, requiring pain medications  Plan to SNF tomorrow.  Will be seen by [facility] Staff tomorrow and transferred if doing well and bed available.  Objective: Vital signs in last 24 hours: Temp:  [97.6 F (36.4 C)-98 F (36.7 C)] 97.8 F (36.6 C) (09/21 0510) Pulse Rate:  [60-80] 72 (09/21 0510) Resp:  [16-17] 16 (09/21 0510) BP: (105-127)/(65-81) 108/71 (09/21 0510) SpO2:  [93 %-100 %] 100 % (09/21 0510)  Intake/Output from previous day:  Intake/Output Summary (Last 24 hours) at 11/20/16 0759 Last data filed at 11/20/16 0730  Gross per 24 hour  Intake          2206.66 ml  Output             2950 ml  Net          -743.34 ml    Intake/Output this shift: Total I/O In: 240 [P.O.:240] Out: -   Labs:  Recent Labs  11/19/16 0527 11/20/16 0524  HGB 12.5* 12.0*    Recent Labs  11/19/16 0527 11/20/16 0524  WBC 12.4* 16.8*  RBC 4.01* 3.83*  HCT 36.3* 34.6*  PLT 240 248    Recent Labs  11/19/16 0527 11/20/16 0524  NA 133* 137  K 4.1 4.8  CL 101 103  CO2 21* 25  BUN 25* 22*  CREATININE 1.55* 1.27*  GLUCOSE 176* 141*  CALCIUM 8.5* 9.4   No results for input(s): LABPT, INR in the last 72 hours.  EXAM: General - Patient is Alert, Appropriate and Oriented Extremity - Neurovascular intact Sensation intact distally Intact pulses distally Dorsiflexion/Plantar flexion intact Incision - clean, dry, no drainage Motor Function - intact, moving foot and toes well on exam.   Assessment/Plan: 2 Days Post-Op Procedure(s) (LRB): Left femoral revision - posterior approach (Left) Procedure(s) (LRB): Left femoral revision -  posterior approach (Left) Past Medical History:  Diagnosis Date  . Arthritis   . ED (erectile dysfunction)   . Enlarged prostate with lower urinary tract symptoms (LUTS)   . GERD (gastroesophageal reflux disease)   . History of chronic gastritis   . History of colon polyps    hyperplastic 2006  . History of esophageal stricture    S/P  DILATATION 2009; 2010; 2010 2012  . History of kidney stones    hx. multiple kidney stones  . History of kidney stones   . History of primary hyperparathyroidism    s/p  left superior parathyroidectomy 07-10-2014  . Hyperlipidemia   . Hypertension   . Left ureteral stone   . Nephrolithiasis    left side   . Pre-diabetes    Principal Problem:   Failed total hip arthroplasty ([place])  Estimated body mass index is 34.87 kg/m as calculated from the following:   Height as of this encounter: 5\' 11"  (1.803 m).   Weight as of this encounter: 113.4 kg (250 lb). Up with therapy Plan for discharge tomorrow Discharge to SNF  Tentative plan for transfer tomorrow - Prepare summary today for transfer tomorrow Diet - Cardiac diet Follow up - in 2 weeks Activity - WBAT Disposition - Skilled nursing facility Condition Upon Discharge - pending D/C Meds -  See DC Summary DVT Prophylaxis - Xarelto  Arlee Muslim, PA-C Orthopaedic Surgery 11/20/2016, 7:59 AM

## 2016-11-20 NOTE — Discharge Summary (Signed)
Physician Discharge Summary   Patient ID: [name] [name] MRN: [id number] DOB/AGE: 03-15-1940 76 y.o.  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 Admission Diagnoses:  Past Medical History:  Diagnosis Date  . Arthritis   . ED (erectile dysfunction)   . Enlarged prostate with lower urinary tract symptoms (LUTS)   . GERD (gastroesophageal reflux disease)   . History of chronic gastritis   . History of colon polyps    hyperplastic 2006  . History of esophageal stricture    S/P  DILATATION 2009; 2010; 2010 2012  . History of kidney stones    hx. multiple kidney stones  . History of kidney stones   . History of primary hyperparathyroidism    s/p  left superior parathyroidectomy 07-10-2014  . Hyperlipidemia   . Hypertension   . Left ureteral stone   . Nephrolithiasis    left side   . Pre-diabetes    Discharge Diagnoses:   Principal Problem:   Failed total hip arthroplasty ([place])  Estimated body mass index is 34.87 kg/m as calculated from the following:   Height as of this encounter: 5' 11"  (1.803 m).   Weight as of this encounter: 113.4 kg (250 lb).  Procedure(s) (LRB): Left femoral revision - posterior approach (Left)   Consults: None  HPI:  [name] is a 76 year old male who had a left total hip arthroplasty done several months ago.  He had progressive pain and a limp.  We noted that his leg lengths had shortened.  An x-ray showed subsidence of the femoral stem.  He presents now for left femoral revision. Laboratory Data: Admission on 11/18/2016  Component Date Value Ref Range Status  . WBC 11/19/2016 12.4* 4.0 - 10.5 K/uL Final  . RBC 11/19/2016 4.01* 4.22 - 5.81 MIL/uL Final  . Hemoglobin 11/19/2016 12.5* 13.0 - 17.0 g/dL Final  . HCT 11/19/2016 36.3* 39.0 - 52.0 % Final  . MCV 11/19/2016 90.5  78.0 - 100.0 fL Final  . MCH 11/19/2016 31.2   26.0 - 34.0 pg Final  . MCHC 11/19/2016 34.4  30.0 - 36.0 g/dL Final  . RDW 11/19/2016 13.3  11.5 - 15.5 % Final  . Platelets 11/19/2016 240  150 - 400 K/uL Final  . Sodium 11/19/2016 133* 135 - 145 mmol/L Final  . Potassium 11/19/2016 4.1  3.5 - 5.1 mmol/L Final  . Chloride 11/19/2016 101  101 - 111 mmol/L Final  . CO2 11/19/2016 21* 22 - 32 mmol/L Final  . Glucose, Bld 11/19/2016 176* 65 - 99 mg/dL Final  . BUN 11/19/2016 25* 6 - 20 mg/dL Final  . Creatinine, Ser 11/19/2016 1.55* 0.61 - 1.24 mg/dL Final  . Calcium 11/19/2016 8.5* 8.9 - 10.3 mg/dL Final  . GFR calc non Af Amer 11/19/2016 43* >60 mL/min Final  . GFR calc Af Amer 11/19/2016 50* >60 mL/min Final   Comment: (NOTE) The eGFR has been calculated using the CKD EPI equation. This calculation has not been validated in all clinical situations. eGFR's persistently <60 mL/min signify possible Chronic Kidney Disease.   . Anion gap 11/19/2016 11  5 - 15 Final  . WBC 11/20/2016 16.8* 4.0 - 10.5 K/uL Final  . RBC 11/20/2016 3.83* 4.22 - 5.81 MIL/uL Final  . Hemoglobin 11/20/2016 12.0* 13.0 - 17.0 g/dL Final  . HCT 11/20/2016 34.6* 39.0 - 52.0 % Final  . MCV 11/20/2016 90.3  78.0 -  100.0 fL Final  . MCH 11/20/2016 31.3  26.0 - 34.0 pg Final  . MCHC 11/20/2016 34.7  30.0 - 36.0 g/dL Final  . RDW 11/20/2016 13.4  11.5 - 15.5 % Final  . Platelets 11/20/2016 248  150 - 400 K/uL Final  . Sodium 11/20/2016 137  135 - 145 mmol/L Final  . Potassium 11/20/2016 4.8  3.5 - 5.1 mmol/L Final  . Chloride 11/20/2016 103  101 - 111 mmol/L Final  . CO2 11/20/2016 25  22 - 32 mmol/L Final  . Glucose, Bld 11/20/2016 141* 65 - 99 mg/dL Final  . BUN 11/20/2016 22* 6 - 20 mg/dL Final  . Creatinine, Ser 11/20/2016 1.27* 0.61 - 1.24 mg/dL Final  . Calcium 11/20/2016 9.4  8.9 - 10.3 mg/dL Final  . GFR calc non Af Amer 11/20/2016 54* >60 mL/min Final  . GFR calc Af Amer 11/20/2016 >60  >60 mL/min Final   Comment: (NOTE) The eGFR has been calculated  using the CKD EPI equation. This calculation has not been validated in all clinical situations. eGFR's persistently <60 mL/min signify possible Chronic Kidney Disease.   Georgiann Hahn gap 11/20/2016 9  5 - 15 Final  Hospital Outpatient Visit on 11/11/2016  Component Date Value Ref Range Status  . aPTT 11/11/2016 28  24 - 36 seconds Final  . WBC 11/11/2016 7.1  4.0 - 10.5 K/uL Final  . RBC 11/11/2016 5.02  4.22 - 5.81 MIL/uL Final  . Hemoglobin 11/11/2016 15.4  13.0 - 17.0 g/dL Final  . HCT 11/11/2016 44.9  39.0 - 52.0 % Final  . MCV 11/11/2016 89.4  78.0 - 100.0 fL Final  . MCH 11/11/2016 30.7  26.0 - 34.0 pg Final  . MCHC 11/11/2016 34.3  30.0 - 36.0 g/dL Final  . RDW 11/11/2016 13.9  11.5 - 15.5 % Final  . Platelets 11/11/2016 275  150 - 400 K/uL Final  . Sodium 11/11/2016 140  135 - 145 mmol/L Final  . Potassium 11/11/2016 4.2  3.5 - 5.1 mmol/L Final  . Chloride 11/11/2016 107  101 - 111 mmol/L Final  . CO2 11/11/2016 24  22 - 32 mmol/L Final  . Glucose, Bld 11/11/2016 88  65 - 99 mg/dL Final  . BUN 11/11/2016 26* 6 - 20 mg/dL Final  . Creatinine, Ser 11/11/2016 1.49* 0.61 - 1.24 mg/dL Final  . Calcium 11/11/2016 9.8  8.9 - 10.3 mg/dL Final  . Total Protein 11/11/2016 7.5  6.5 - 8.1 g/dL Final  . Albumin 11/11/2016 4.1  3.5 - 5.0 g/dL Final  . AST 11/11/2016 32  15 - 41 U/L Final  . ALT 11/11/2016 27  17 - 63 U/L Final  . Alkaline Phosphatase 11/11/2016 31* 38 - 126 U/L Final  . Total Bilirubin 11/11/2016 1.1  0.3 - 1.2 mg/dL Final  . GFR calc non Af Amer 11/11/2016 45* >60 mL/min Final  . GFR calc Af Amer 11/11/2016 52* >60 mL/min Final   Comment: (NOTE) The eGFR has been calculated using the CKD EPI equation. This calculation has not been validated in all clinical situations. eGFR's persistently <60 mL/min signify possible Chronic Kidney Disease.   . Anion gap 11/11/2016 9  5 - 15 Final  . Prothrombin Time 11/11/2016 13.1  11.4 - 15.2 seconds Final  . INR 11/11/2016 1.00    Final  . ABO/RH(D) 11/11/2016 A POS   Final  . Antibody Screen 11/11/2016 NEG   Final  . Sample Expiration 11/11/2016 11/21/2016   Final  .  Extend sample reason 11/11/2016 NO TRANSFUSIONS OR PREGNANCY IN THE PAST 3 MONTHS   Final  . MRSA, PCR 11/11/2016 NEGATIVE  NEGATIVE Final  . Staphylococcus aureus 11/11/2016 NEGATIVE  NEGATIVE Final   Comment: (NOTE) The Xpert SA Assay (FDA approved for NASAL specimens in patients 26 years of age and older), is one component of a comprehensive surveillance program. It is not intended to diagnose infection nor to guide or monitor treatment.   . Hgb A1c MFr Bld 11/11/2016 6.0* 4.8 - 5.6 % Final   Comment: (NOTE) Pre diabetes:          5.7%-6.4% Diabetes:              >6.4% Glycemic control for   <7.0% adults with diabetes   . Mean Plasma Glucose 11/11/2016 125.5  mg/dL Final   Performed at [facility] [address] [place], [place] [address]  Orders Only on 10/28/2016  Component Date Value Ref Range Status  . Fecal Occult Blood, POC 10/28/2016 Negative  Negative Final  . Card #2 Fecal Occult Blod, POC 10/28/2016 Negative   Final  . Card #3 Fecal Occult Blood, POC 10/28/2016 Negative   Final  Office Visit on 09/28/2016  Component Date Value Ref Range Status  . Color, Urine 09/28/2016 CANCELED  YELLOW Final-Edited   Comment: The preferred specimen for urinalysis testing is the [facility]) Scientific urine collection tube. Effective 11/23/2015, [employer], a [employer], will reject any urine cup received that is not transferred into the preferred [place](R) tube. Using this device, specimen components remain stable in transit up to 72 hours at ambient temperatures. Urine may be collected in a clean, unused cup and transferred to the yellow cap transfer tube. Supply order number is: [id number]. If you have any questions, please contact your [facility] Account Representative directly, or call our Customer  Service Department at [phone]. Test not performed, no urine was received.    Result canceled by the ancillary   . APPearance 09/28/2016 CANCELED  CLEAR Final-Edited   Result canceled by the ancillary  . Specific Gravity, Urine 09/28/2016 CANCELED  1.001 - 1.035 Final-Edited   Result canceled by the ancillary  . pH 09/28/2016 CANCELED  5.0 - 8.0 Final-Edited   Result canceled by the ancillary  . Glucose, UA 09/28/2016 CANCELED  NEGATIVE Final-Edited   Result canceled by the ancillary  . Bilirubin Urine 09/28/2016 CANCELED  NEGATIVE Final-Edited   Result canceled by the ancillary  . Ketones, ur 09/28/2016 CANCELED  NEGATIVE Final-Edited   Result canceled by the ancillary  . Hgb urine dipstick 09/28/2016 CANCELED  NEGATIVE Final-Edited   Result canceled by the ancillary  . Protein, ur 09/28/2016 CANCELED  NEGATIVE Final-Edited   Result canceled by the ancillary  . Nitrite 09/28/2016 CANCELED  NEGATIVE Final-Edited   Result canceled by the ancillary  . Leukocytes, UA 09/28/2016 CANCELED  NEGATIVE Final-Edited   Result canceled by the ancillary  . PSA 09/28/2016 1.7  <=4.0 ng/mL Final   Comment:   The total PSA value from this assay system is standardized against the WHO standard. The test result will be approximately 20% lower when compared to the equimolar-standardized total PSA (Beckman Coulter). Comparison of serial PSA results should be interpreted with this fact in mind.   This test was performed using the Siemens chemiluminescent method. Values obtained from different assay methods cannot be used interchangeably. PSA levels, regardless of value, should not be interpreted as absolute evidence of the presence or absence  of disease.     . WBC 09/28/2016 7.0  3.8 - 10.8 K/uL Final  . RBC 09/28/2016 4.69  4.20 - 5.80 MIL/uL Final  . Hemoglobin 09/28/2016 14.3  13.2 - 17.1 g/dL Final  . HCT 09/28/2016 42.5  38.5 - 50.0 % Final  . MCV 09/28/2016 90.6  80.0 - 100.0 fL  Final  . MCH 09/28/2016 30.5  27.0 - 33.0 pg Final  . MCHC 09/28/2016 33.6  32.0 - 36.0 g/dL Final  . RDW 09/28/2016 14.6  11.0 - 15.0 % Final  . Platelets 09/28/2016 280  140 - 400 K/uL Final  . MPV 09/28/2016 10.3  7.5 - 12.5 fL Final  . Neutro Abs 09/28/2016 3920  1,500 - 7,800 cells/uL Final  . Lymphs Abs 09/28/2016 2030  850 - 3,900 cells/uL Final  . Monocytes Absolute 09/28/2016 700  200 - 950 cells/uL Final  . Eosinophils Absolute 09/28/2016 350  15 - 500 cells/uL Final  . Basophils Absolute 09/28/2016 0  0 - 200 cells/uL Final  . Neutrophils Relative % 09/28/2016 56  % Final  . Lymphocytes Relative 09/28/2016 29  % Final  . Monocytes Relative 09/28/2016 10  % Final  . Eosinophils Relative 09/28/2016 5  % Final  . Basophils Relative 09/28/2016 0  % Final  . Smear Review 09/28/2016 Criteria for review not met   Final  . Sodium 09/28/2016 137  135 - 146 mmol/L Final  . Potassium 09/28/2016 4.5  3.5 - 5.3 mmol/L Final  . Chloride 09/28/2016 103  98 - 110 mmol/L Final  . CO2 09/28/2016 20  20 - 31 mmol/L Final  . Glucose, Bld 09/28/2016 98  65 - 99 mg/dL Final  . BUN 09/28/2016 22  7 - 25 mg/dL Final  . Creat 09/28/2016 1.66* 0.70 - 1.18 mg/dL Final   Comment:   For patients > or = 74 years of age: The upper reference limit for Creatinine is approximately 13% higher for people identified as African-American.     . Calcium 09/28/2016 9.5  8.6 - 10.3 mg/dL Final  . GFR, Est African American 09/28/2016 47* >=60 mL/min Final  . GFR, Est Non African American 09/28/2016 40* >=60 mL/min Final   Comment:   The estimated GFR is a calculation valid for adults (>=20 years old) that uses the CKD-EPI algorithm to adjust for age and sex. It is   not to be used for children, pregnant women, hospitalized patients,    patients on dialysis, or with rapidly changing kidney function. According to the NKDEP, eGFR >89 is normal, 60-89 shows mild impairment, 30-59 shows moderate impairment, 15-29  shows severe impairment and <15 is ESRD.     Marland Kitchen Total Bilirubin 09/28/2016 0.5  0.2 - 1.2 mg/dL Final  . Bilirubin, Direct 09/28/2016 0.1  <=0.2 mg/dL Final  . Indirect Bilirubin 09/28/2016 0.4  0.2 - 1.2 mg/dL Final  . Alkaline Phosphatase 09/28/2016 34* 40 - 115 U/L Final  . AST 09/28/2016 25  10 - 35 U/L Final  . ALT 09/28/2016 21  9 - 46 U/L Final  . Total Protein 09/28/2016 6.7  6.1 - 8.1 g/dL Final  . Albumin 09/28/2016 4.2  3.6 - 5.1 g/dL Final  . Magnesium 09/28/2016 1.7  1.5 - 2.5 mg/dL Final  . Cholesterol 09/28/2016 188  <200 mg/dL Final  . Triglycerides 09/28/2016 170* <150 mg/dL Final  . HDL 09/28/2016 50  >40 mg/dL Final  . Total CHOL/HDL Ratio 09/28/2016 3.8  <5.0 Ratio Final  .  VLDL 09/28/2016 34* <30 mg/dL Final  . LDL Cholesterol 09/28/2016 104* <100 mg/dL Final  . TSH 09/28/2016 2.50  0.40 - 4.50 mIU/L Final  . Hgb A1c MFr Bld 09/28/2016 5.7* <5.7 % Final   Comment:   For someone without known diabetes, a hemoglobin A1c value between 5.7% and 6.4% is consistent with prediabetes and should be confirmed with a follow-up test.   For someone with known diabetes, a value <7% indicates that their diabetes is well controlled. A1c targets should be individualized based on duration of diabetes, age, co-morbid conditions and other considerations.   This assay result is consistent with an increased risk of diabetes.   Currently, no consensus exists regarding use of hemoglobin A1c for diagnosis of diabetes in children.     . Mean Plasma Glucose 09/28/2016 117  mg/dL Final  . Insulin 09/28/2016 13.9  2.0 - 19.6 uIU/mL Final   Comment: This insulin assay shows strong cross-reactivity for some insulin analogs (lispro, aspart, and glargine) and much lower cross-reactivity with others (detemir, glulisine).   . Vit D, 25-Hydroxy 09/28/2016 81  30 - 100 ng/mL Final   Comment: Vitamin D Status           25-OH Vitamin D        Deficiency                <20 ng/mL         Insufficiency         20 - 29 ng/mL        Optimal             > or = 30 ng/mL   For 25-OH Vitamin D testing on patients on D2-supplementation and patients for whom quantitation of D2 and D3 fractions is required, the [facility] 25-OH VIT D, (D2,D3), LC/MS/MS is recommended: order code [id number] (patients > 2 yrs).   . Creatinine, Urine 09/28/2016 CANCELED  20 - 370 mg/dL Final-Edited   Comment: Test not performed, no urine was received.    Result canceled by the ancillary   . Microalb, Ur 09/28/2016 CANCELED  Not estab mg/dL Final-Edited   Result canceled by the ancillary  . Microalb Creat Ratio 09/28/2016 CANCELED  <30 mcg/mg creat Final-Edited   Comment: The ADA has defined abnormalities in albumin excretion as follows:           Category           Result                            (mcg/mg creatinine)                 Normal:    <30       Microalbuminuria:    30 - 299   Clinical albuminuria:    > or = 300   The ADA recommends that at least two of three specimens collected within a 3 - 6 month period be abnormal before considering a patient to be within a diagnostic category.    Result canceled by the ancillary      X-Rays:Dg Pelvis Portable  Result Date: 11/18/2016 CLINICAL DATA:  Left hip hardware revision EXAM: PORTABLE PELVIS 1-2 VIEWS COMPARISON:  06/24/2016 FINDINGS: Revision of the left total hip arthroplasty. Components appear aligned in the frontal plane. Small scattered left proximal femur subtrochanteric cortical irregularities versus small fractures, suspect related to the operative revision. Surgical drain in place. Expected postoperative changes of  the soft tissues. Bony pelvis intact. Mild progressive arthropathy of the right hip. Degenerative changes of the lumbosacral spine. IMPRESSION: Postop revision of the left total hip arthroplasty. Hardware components appear aligned in the frontal plane. Small scattered left proximal femur subtrochanteric lateral cortical  irregularities versus small incomplete fractures possibly related to the operative procedure. Electronically Signed   By: Jerilynn Mages.  Shick M.D.   On: 11/18/2016 22:31    EKG: Orders placed or performed in visit on 09/28/16  . EKG 12-Lead     Hospital Course: Patient was admitted to [facility] and taken to the OR and underwent the above state procedure without complications.  Patient tolerated the procedure well and was later transferred to the recovery room and then to the orthopaedic floor for postoperative care.  They were given PO and IV analgesics for pain control following their surgery.  They were given 24 hours of postoperative antibiotics of  Anti-infectives    Start     Dose/Rate Route Frequency Ordered Stop   11/19/16 0600  ceFAZolin (ANCEF) IVPB 2g/100 mL premix     2 g 200 mL/hr over 30 Minutes Intravenous On call to O.R. 11/18/16 1417 11/18/16 1712   11/18/16 2300  ceFAZolin (ANCEF) IVPB 2g/100 mL premix     2 g 200 mL/hr over 30 Minutes Intravenous Every 6 hours 11/18/16 2203 11/19/16 0609   11/18/16 1514  ceFAZolin (ANCEF) 2-4 GM/100ML-% IVPB    Comments:  Bridget Hartshorn   : cabinet override      11/18/16 1514 11/18/16 1707     and started on DVT prophylaxis in the form of Xarelto.   PT and OT were ordered for total hip protocol.  The patient was allowed to be WBAT with therapy. Discharge planning was consulted to help with postop disposition and equipment needs. Social worker consulted to assist with placement of the patient.  Patient had a good night on the evening of surgery.  They started to get up OOB with therapy on day one.  Hemovac drain was pulled without difficulty.  The knee immobilizer was removed and discontinued.  Continued to work with therapy into day two.  Dressing was changed on day two and the incision was healing well.  Patient was seen in rounds on day two and was doing well.  It was felt that as long as the patient continued to improve, they would be  ready to transfer the following day, Saturday 11/22/2106.  The patient will evaluated by the weekend coverage staff and will discharge if doing well.  This summary was prepared in anticipation of the the patient's transfer over the weekend.   Diet: Cardiac diet Activity:WBAT No bending hip over 90 degrees- A "L" Angle Do not cross legs Do not let foot roll inward When turning these patients a pillow should be placed between the patient's legs to prevent crossing. Patients should have the affected knee fully extended when trying to sit or stand from all surfaces to prevent excessive hip flexion. When ambulating and turning toward the affected side the affected leg should have the toes turned out prior to moving the walker and the rest of patient's body as to prevent internal rotation/ turning in of the leg. Abduction pillows are the most effective way to prevent a patient from not crossing legs or turning toes in at rest. If an abduction pillow is not ordered placing a regular pillow length wise between the patient's legs is also an effective reminder. It is imperative that  these precautions be maintained so that the surgical hip does not dislocate. Follow-up:in 2 weeks Disposition - Skilled nursing facility Discharged Condition: Pending at time of summary, Transfer on Saturday if doing well on weekend rounds.   Discharge Instructions    Call MD / Call 911    Complete by:  As directed    If you experience chest pain or shortness of breath, CALL 911 and be transported to the hospital emergency room.  If you develope a fever above 101 F, pus (white drainage) or increased drainage or redness at the wound, or calf pain, call your surgeon's office.   Change dressing    Complete by:  As directed    You may change your dressing dressing daily with sterile 4 x 4 inch gauze dressing and paper tape.  Do not submerge the incision under water.   Constipation Prevention    Complete by:  As directed     Drink plenty of fluids.  Prune juice may be helpful.  You may use a stool softener, such as Colace (over the counter) 100 mg twice a day.  Use MiraLax (over the counter) for constipation as needed.   Diet - low sodium heart healthy    Complete by:  As directed    Discharge instructions    Complete by:  As directed    Take Xarelto for two and a half more weeks, then discontinue Xarelto. Once the patient has completed the Xarelto, they may resume the 81 mg Aspirin.   Pick up stool softner and laxative for home use following surgery while on pain medications. Do not submerge incision under water. Please use good hand washing techniques while changing dressing each day. May shower starting three days after surgery. Please use a clean towel to pat the incision dry following showers. Continue to use ice for pain and swelling after surgery. Do not use any lotions or creams on the incision until instructed by your surgeon.  Wear both TED hose on both legs during the day every day for three weeks, but may remove the TED hose at night at home.  Postoperative Constipation Protocol  Constipation - defined medically as fewer than three stools per week and severe constipation as less than one stool per week.  One of the most common issues patients have following surgery is constipation.  Even if you have a regular bowel pattern at home, your normal regimen is likely to be disrupted due to multiple reasons following surgery.  Combination of anesthesia, postoperative narcotics, change in appetite and fluid intake all can affect your bowels.  In order to avoid complications following surgery, here are some recommendations in order to help you during your recovery period.  Colace (docusate) - Pick up an over-the-counter form of Colace or another stool softener and take twice a day as long as you are requiring postoperative pain medications.  Take with a full glass of water daily.  If you experience loose  stools or diarrhea, hold the colace until you stool forms back up.  If your symptoms do not get better within 1 week or if they get worse, check with your doctor.  Dulcolax (bisacodyl) - Pick up over-the-counter and take as directed by the product packaging as needed to assist with the movement of your bowels.  Take with a full glass of water.  Use this product as needed if not relieved by Colace only.   MiraLax (polyethylene glycol) - Pick up over-the-counter to have on hand.  MiraLax  is a solution that will increase the amount of water in your bowels to assist with bowel movements.  Take as directed and can mix with a glass of water, juice, soda, coffee, or tea.  Take if you go more than two days without a movement. Do not use MiraLax more than once per day. Call your doctor if you are still constipated or irregular after using this medication for 7 days in a row.  If you continue to have problems with postoperative constipation, please contact the office for further assistance and recommendations.  If you experience "the worst abdominal pain ever" or develop nausea or vomiting, please contact the office immediatly for further recommendations for treatment.   Do not sit on low chairs, stoools or toilet seats, as it may be difficult to get up from low surfaces    Complete by:  As directed    Driving restrictions    Complete by:  As directed    No driving until released by the physician.   Follow the hip precautions as taught in Physical Therapy    Complete by:  As directed    Increase activity slowly as tolerated    Complete by:  As directed    Lifting restrictions    Complete by:  As directed    No lifting until released by the physician.   Patient may shower    Complete by:  As directed    You may shower without a dressing once there is no drainage.  Do not wash over the wound.  If drainage remains, do not shower until drainage stops.   TED hose    Complete by:  As directed    Use  stockings (TED hose) for 3 weeks on both leg(s).  You may remove them at night for sleeping.   Weight bearing as tolerated    Complete by:  As directed    Laterality:  left   Extremity:  Lower     Allergies as of 11/20/2016      Reactions   Iohexol Shortness Of Breath   Ace Inhibitors Cough      Medication List    STOP taking these medications   aspirin EC 81 MG tablet   Fish Oil 1200 MG Caps   HM FLAXSEED OIL 1000 MG Caps   meloxicam 15 MG tablet Commonly known as:  MOBIC   SM B SUPER VITAMIN COMPLEX PO   UNABLE TO FIND     TAKE these medications   acetaminophen 325 MG tablet Commonly known as:  TYLENOL Take 2 tablets (650 mg total) by mouth every 6 (six) hours as needed for mild pain (or Fever >/= 101).   bisacodyl 10 MG suppository Commonly known as:  DULCOLAX Place 1 suppository (10 mg total) rectally daily as needed for moderate constipation.   bisoprolol-hydrochlorothiazide 5-6.25 MG tablet Commonly known as:  ZIAC TAKE 1 TABLET BY MOUTH EVERY MORNING FOR BLOOD PRESSURE   diphenhydrAMINE 12.5 MG/5ML elixir Commonly known as:  BENADRYL Take 5-10 mLs (12.5-25 mg total) by mouth every 4 (four) hours as needed for itching.   docusate sodium 100 MG capsule Commonly known as:  COLACE Take 1 capsule (100 mg total) by mouth 2 (two) times daily.   fenofibrate 145 MG tablet Commonly known as:  TRICOR TAKE 1 TABLET BY MOUTH DAILY FOR TRIGLYCERIDES/BLOOD FATS   finasteride 5 MG tablet Commonly known as:  PROSCAR Take 5 mg by mouth daily.   hydrocortisone 2.5 % rectal cream Commonly  known as:  ANUSOL-HC Apply rectally 2 times daily   hydrocortisone 25 MG suppository Commonly known as:  ANUSOL-HC Place 1 suppository (25 mg total) rectally every 12 (twelve) hours.   losartan 100 MG tablet Commonly known as:  COZAAR TAKE 1 TABLET BY MOUTH DAILY FOR BLOOD PRESSURE.   magnesium oxide 400 MG tablet Commonly known as:  MAG-OX Take 400 mg by mouth daily.     methocarbamol 500 MG tablet Commonly known as:  ROBAXIN Take 1 tablet (500 mg total) by mouth every 6 (six) hours as needed for muscle spasms.   omeprazole 40 MG capsule Commonly known as:  PRILOSEC TAKE 1 CAPSULE BY MOUTH DAILY.   ondansetron 4 MG tablet Commonly known as:  ZOFRAN Take 1 tablet (4 mg total) by mouth every 6 (six) hours as needed for nausea.   oxybutynin 5 MG tablet Commonly known as:  DITROPAN Take 1 tablet (5 mg total) by mouth every 8 (eight) hours as needed for bladder spasms.   oxyCODONE 5 MG immediate release tablet Commonly known as:  Oxy IR/ROXICODONE Take 1-2 tablets (5-10 mg total) by mouth every 4 (four) hours as needed for moderate pain or severe pain.   polyethylene glycol packet Commonly known as:  MIRALAX / GLYCOLAX Take 17 g by mouth daily as needed for mild constipation.   rivaroxaban 10 MG Tabs tablet Commonly known as:  XARELTO Take 1 tablet (10 mg total) by mouth daily with breakfast. Take Xarelto for two and a half more weeks following discharge from the hospital, then discontinue Xarelto. Once the patient has completed the Xarelto, they may resume the 81 mg Aspirin.   sodium phosphate 7-19 GM/118ML Enem Place 133 mLs (1 enema total) rectally once as needed for severe constipation.   tamsulosin 0.4 MG Caps capsule Commonly known as:  FLOMAX Take 0.4 mg by mouth daily after breakfast.   traMADol 50 MG tablet Commonly known as:  ULTRAM Take 1-2 tablets (50-100 mg total) by mouth every 6 (six) hours as needed for moderate pain.            Discharge Care Instructions        Start     Ordered   11/20/16 0000  acetaminophen (TYLENOL) 325 MG tablet  Every 6 hours PRN    Question:  Supervising Provider  Answer:  Gaynelle Arabian   11/20/16 0809   11/20/16 0000  bisacodyl (DULCOLAX) 10 MG suppository  Daily PRN    Question:  Supervising Provider  Answer:  Gaynelle Arabian   11/20/16 0809   11/20/16 0000  diphenhydrAMINE (BENADRYL) 12.5  MG/5ML elixir  Every 4 hours PRN    Question:  Supervising Provider  Answer:  Gaynelle Arabian   11/20/16 0809   11/20/16 0000  docusate sodium (COLACE) 100 MG capsule  2 times daily    Question:  Supervising Provider  Answer:  Gaynelle Arabian   11/20/16 0809   11/20/16 0000  methocarbamol (ROBAXIN) 500 MG tablet  Every 6 hours PRN    Question:  Supervising Provider  Answer:  Gaynelle Arabian   11/20/16 0809   11/20/16 0000  ondansetron (ZOFRAN) 4 MG tablet  Every 6 hours PRN    Question:  Supervising Provider  Answer:  Gaynelle Arabian   11/20/16 0809   11/20/16 0000  oxyCODONE (OXY IR/ROXICODONE) 5 MG immediate release tablet  Every 4 hours PRN    Question:  Supervising Provider  Answer:  Gaynelle Arabian   11/20/16 0809   11/20/16 0000  polyethylene glycol (MIRALAX / GLYCOLAX) packet  Daily PRN    Question:  Supervising Provider  Answer:  Gaynelle Arabian   11/20/16 0809   11/20/16 0000  rivaroxaban (XARELTO) 10 MG TABS tablet  Daily with breakfast    Question:  Supervising Provider  Answer:  Gaynelle Arabian   11/20/16 0809   11/20/16 0000  sodium phosphate (FLEET) 7-19 GM/118ML ENEM  Once PRN    Question:  Supervising Provider  Answer:  Gaynelle Arabian   11/20/16 0809   11/20/16 0000  traMADol (ULTRAM) 50 MG tablet  Every 6 hours PRN    Question:  Supervising Provider  Answer:  Gaynelle Arabian   11/20/16 0809   11/20/16 0000  Call MD / Call 911    Comments:  If you experience chest pain or shortness of breath, CALL 911 and be transported to the hospital emergency room.  If you develope a fever above 101 F, pus (white drainage) or increased drainage or redness at the wound, or calf pain, call your surgeon's office.   11/20/16 0809   11/20/16 0000  Discharge instructions    Comments:  Take Xarelto for two and a half more weeks, then discontinue Xarelto. Once the patient has completed the Xarelto, they may resume the 81 mg Aspirin.   Pick up stool softner and laxative for home use following  surgery while on pain medications. Do not submerge incision under water. Please use good hand washing techniques while changing dressing each day. May shower starting three days after surgery. Please use a clean towel to pat the incision dry following showers. Continue to use ice for pain and swelling after surgery. Do not use any lotions or creams on the incision until instructed by your surgeon.  Wear both TED hose on both legs during the day every day for three weeks, but may remove the TED hose at night at home.  Postoperative Constipation Protocol  Constipation - defined medically as fewer than three stools per week and severe constipation as less than one stool per week.  One of the most common issues patients have following surgery is constipation.  Even if you have a regular bowel pattern at home, your normal regimen is likely to be disrupted due to multiple reasons following surgery.  Combination of anesthesia, postoperative narcotics, change in appetite and fluid intake all can affect your bowels.  In order to avoid complications following surgery, here are some recommendations in order to help you during your recovery period.  Colace (docusate) - Pick up an over-the-counter form of Colace or another stool softener and take twice a day as long as you are requiring postoperative pain medications.  Take with a full glass of water daily.  If you experience loose stools or diarrhea, hold the colace until you stool forms back up.  If your symptoms do not get better within 1 week or if they get worse, check with your doctor.  Dulcolax (bisacodyl) - Pick up over-the-counter and take as directed by the product packaging as needed to assist with the movement of your bowels.  Take with a full glass of water.  Use this product as needed if not relieved by Colace only.   MiraLax (polyethylene glycol) - Pick up over-the-counter to have on hand.  MiraLax is a solution that will increase the amount of  water in your bowels to assist with bowel movements.  Take as directed and can mix with a glass of water, juice, soda, coffee, or tea.  Take  if you go more than two days without a movement. Do not use MiraLax more than once per day. Call your doctor if you are still constipated or irregular after using this medication for 7 days in a row.  If you continue to have problems with postoperative constipation, please contact the office for further assistance and recommendations.  If you experience "the worst abdominal pain ever" or develop nausea or vomiting, please contact the office immediatly for further recommendations for treatment.   11/20/16 0809   11/20/16 0000  Diet - low sodium heart healthy     11/20/16 0809   11/20/16 0000  Constipation Prevention    Comments:  Drink plenty of fluids.  Prune juice may be helpful.  You may use a stool softener, such as Colace (over the counter) 100 mg twice a day.  Use MiraLax (over the counter) for constipation as needed.   11/20/16 0809   11/20/16 0000  Increase activity slowly as tolerated     11/20/16 0809   11/20/16 0000  Patient may shower    Comments:  You may shower without a dressing once there is no drainage.  Do not wash over the wound.  If drainage remains, do not shower until drainage stops.   11/20/16 0809   11/20/16 0000  Weight bearing as tolerated    Question Answer Comment  Laterality left   Extremity Lower      11/20/16 0809   11/20/16 0000  Driving restrictions    Comments:  No driving until released by the physician.   11/20/16 0809   11/20/16 0000  Lifting restrictions    Comments:  No lifting until released by the physician.   11/20/16 0809   11/20/16 0000  Change dressing    Comments:  You may change your dressing dressing daily with sterile 4 x 4 inch gauze dressing and paper tape.  Do not submerge the incision under water.   11/20/16 0809   11/20/16 0000  TED hose    Comments:  Use stockings (TED hose) for 3 weeks on both  leg(s).  You may remove them at night for sleeping.   11/20/16 0809   11/20/16 0000  Do not sit on low chairs, stoools or toilet seats, as it may be difficult to get up from low surfaces     11/20/16 0809   11/20/16 0000  Follow the hip precautions as taught in Physical Therapy     11/20/16 0809      Contact information for follow-up providers    Gaynelle Arabian, MD. Schedule an appointment as soon as possible for a visit on 12/01/2016.   Specialty:  Orthopedic Surgery Contact information: [address] [place] [address] [phone]            Contact information for after-discharge care    Destination    [facility] Follow up.   Specialty:  Skilled Nursing Facility Contact information: [place] [phone]                  Signed: Arlee Muslim, PA-C Orthopaedic Surgery 11/20/2016, 8:10 AM

## 2016-11-20 NOTE — Progress Notes (Signed)
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)    PT Comments    Pt progressing steadily with mobility and becoming more comfortable with new THP   Follow Up Recommendations  SNF     Equipment Recommendations  None recommended by PT    Recommendations for Other Services OT consult     Precautions / Restrictions Precautions Precautions: Fall;Posterior Hip Precaution Booklet Issued: Yes (comment) Precaution Comments: Pt recalls THP without cues Restrictions Weight Bearing Restrictions: No Other Position/Activity Restrictions: WBAT    Mobility  Bed Mobility               General bed mobility comments: Pt OOB and requests back to chair  Transfers Overall transfer level: Needs assistance Equipment used: Rolling walker (2 wheeled) Transfers: Sit to/from Stand Sit to Stand: Min assist;Min guard         General transfer comment: cues for LE management and use of UEs to self assist  Ambulation/Gait Ambulation/Gait assistance: Min assist;Min guard Ambulation Distance (Feet): 140 Feet Assistive device: Rolling walker (2 wheeled) Gait Pattern/deviations: Step-to pattern;Decreased step length - right;Decreased step length - left;Shuffle;Trunk flexed Gait velocity: decr Gait velocity interpretation: Below normal speed for age/gender General Gait Details: cues for sequence, posture, position from RW and ER on L   Stairs            Wheelchair Mobility    Modified Rankin (Stroke Patients Only)       Balance                                            Cognition Arousal/Alertness: Awake/alert Behavior During Therapy: WFL for tasks assessed/performed Overall Cognitive Status: Within Functional Limits for tasks assessed                                         Exercises Total Joint Exercises Ankle Circles/Pumps: AROM;Both;15 reps;Supine Quad Sets: AROM;Both;10 reps;Supine Heel Slides: AAROM;Left;20 reps;Supine Hip ABduction/ADduction: AAROM;Left;15 reps;Supine    General Comments        Pertinent Vitals/Pain Pain Assessment: 0-10 Pain Score: 5  Pain Location: L hip Pain Descriptors / Indicators: Aching;Sore Pain Intervention(s): Limited activity within patient's tolerance;Monitored during session;Premedicated before session    Home Living                      Prior Function            PT Goals (current goals can now be found in the care plan section) Acute Rehab PT Goals Patient Stated Goal: Rehab to regain strength and IND  PT Goal Formulation: With patient Time For Goal Achievement: 11/23/16 Potential to Achieve Goals: Good Progress towards PT goals: Progressing toward goals    Frequency    7X/week      PT Plan Current plan remains appropriate    Co-evaluation              AM-PAC PT "6 Clicks" Daily Activity  Outcome Measure  Difficulty turning over in bed (including adjusting bedclothes, sheets and blankets)?: Unable Difficulty moving from lying on back to sitting on the side of the bed? :  Unable Difficulty sitting down on and standing up from a chair with arms (e.g., wheelchair, bedside commode, etc,.)?: Unable Help needed moving to and from a bed to chair (including a wheelchair)?: A Little Help needed walking in hospital room?: A Little Help needed climbing 3-5 steps with a railing? : A Lot 6 Click Score: 11    End of Session Equipment Utilized During Treatment: Gait belt Activity Tolerance: Patient tolerated treatment well;Patient limited by pain Patient left: in chair;with call bell/phone within reach Nurse Communication: Mobility status PT Visit Diagnosis: Unsteadiness on feet (R26.81);Difficulty in walking, not elsewhere classified (R26.2)     Time: 3235-5732 PT Time Calculation  (min) (ACUTE ONLY): 33 min  Charges:  $Gait Training: 8-22 mins $Therapeutic Exercise: 8-22 mins                    G Codes:       Pg [phone]    Shantrell Placzek 11/20/2016, 3:19 PM

## 2016-11-21 DIAGNOSIS — R2681 Unsteadiness on feet: Secondary | ICD-10-CM | POA: Diagnosis not present

## 2016-11-21 DIAGNOSIS — K219 Gastro-esophageal reflux disease without esophagitis: Secondary | ICD-10-CM | POA: Diagnosis not present

## 2016-11-21 DIAGNOSIS — I10 Essential (primary) hypertension: Secondary | ICD-10-CM | POA: Diagnosis not present

## 2016-11-21 DIAGNOSIS — R278 Other lack of coordination: Secondary | ICD-10-CM | POA: Diagnosis not present

## 2016-11-21 DIAGNOSIS — E785 Hyperlipidemia, unspecified: Secondary | ICD-10-CM | POA: Diagnosis not present

## 2016-11-21 DIAGNOSIS — N200 Calculus of kidney: Secondary | ICD-10-CM | POA: Diagnosis not present

## 2016-11-21 DIAGNOSIS — M25552 Pain in left hip: Secondary | ICD-10-CM | POA: Diagnosis not present

## 2016-11-21 DIAGNOSIS — R7303 Prediabetes: Secondary | ICD-10-CM | POA: Diagnosis not present

## 2016-11-21 DIAGNOSIS — Z471 Aftercare following joint replacement surgery: Secondary | ICD-10-CM | POA: Diagnosis not present

## 2016-11-21 DIAGNOSIS — Z96642 Presence of left artificial hip joint: Secondary | ICD-10-CM | POA: Diagnosis not present

## 2016-11-21 DIAGNOSIS — M6281 Muscle weakness (generalized): Secondary | ICD-10-CM | POA: Diagnosis not present

## 2016-11-21 LAB — CBC
HCT: 34.2 % — ABNORMAL LOW (ref 39.0–52.0)
Hemoglobin: 11.7 g/dL — ABNORMAL LOW (ref 13.0–17.0)
MCH: 31 pg (ref 26.0–34.0)
MCHC: 34.2 g/dL (ref 30.0–36.0)
MCV: 90.5 fL (ref 78.0–100.0)
Platelets: 253 10*3/uL (ref 150–400)
RBC: 3.78 MIL/uL — ABNORMAL LOW (ref 4.22–5.81)
RDW: 13.7 % (ref 11.5–15.5)
WBC: 11.8 10*3/uL — ABNORMAL HIGH (ref 4.0–10.5)

## 2016-11-21 NOTE — Progress Notes (Signed)
[name] [name]  MRN: [id number] DOB/Age: February 12, 1941 76 y.o. Physician: Ander Slade, M.D. 3 Days Post-Op Procedure(s) (LRB): Left femoral revision - posterior approach (Left)  Subjective: Resting comfortably. Ambulating with walker. Denies nausea or vomiting. Vital Signs Temp:  [98.6 F (37 C)-98.7 F (37.1 C)] 98.6 F (37 C) (09/22 0609) Pulse Rate:  [72-77] 77 (09/22 0609) Resp:  [15-16] 16 (09/22 0609) BP: (111-139)/(65-77) 139/76 (09/22 0609) SpO2:  [99 %-100 %] 100 % (09/22 0609)  Lab Results  Recent Labs  11/20/16 0524 11/21/16 0551  WBC 16.8* 11.8*  HGB 12.0* 11.7*  HCT 34.6* 34.2*  PLT 248 253   BMET  Recent Labs  11/19/16 0527 11/20/16 0524  NA 133* 137  K 4.1 4.8  CL 101 103  CO2 21* 25  GLUCOSE 176* 141*  BUN 25* 22*  CREATININE 1.55* 1.27*  CALCIUM 8.5* 9.4   INR  Date Value Ref Range Status  11/11/2016 1.00  Final     Exam  Left hip incision dressing is dry. Compartments soft. Neurovascular intact distally left lower artery.  Plan DC to skilled nursing facility today. Follow revision total hip arthroplasty rehabilitation protocol. Brian Zeitlin M Miel Wisener 11/21/2016, 9:17 AM    Contact # [phone]

## 2016-11-21 NOTE — Progress Notes (Signed)
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   Follow Up Recommendations  SNF     Equipment Recommendations  None recommended by PT    Recommendations for Other Services OT consult     Precautions / Restrictions Precautions Precautions: Fall;Posterior Hip Precaution Booklet Issued: Yes (comment) Precaution Comments: Pt recalls THP without cues Restrictions Weight Bearing Restrictions: No Other Position/Activity Restrictions: WBAT    Mobility  Bed Mobility               General bed mobility comments: Pt OOB and requests back to chair  Transfers Overall transfer level: Needs assistance Equipment used: Rolling walker (2 wheeled) Transfers: Sit to/from Stand Sit to Stand: Min guard         General transfer comment: cues for LE management and use of UEs to self assist  Ambulation/Gait Ambulation/Gait assistance: Min guard Ambulation Distance (Feet): 190 Feet Assistive device: Rolling walker (2 wheeled) Gait Pattern/deviations: Step-to pattern;Decreased step length - right;Decreased step length - left;Shuffle;Trunk flexed Gait velocity: decr Gait velocity interpretation: Below normal speed for age/gender General Gait Details: min cues for posture, position from RW and ER on L   Stairs            Wheelchair Mobility    Modified Rankin (Stroke Patients Only)       Balance                                            Cognition Arousal/Alertness: Awake/alert Behavior During Therapy: WFL for tasks assessed/performed Overall Cognitive Status: Within Functional Limits for tasks assessed                                        Exercises Total Joint Exercises Ankle  Circles/Pumps: AROM;Both;15 reps;Supine Quad Sets: AROM;Both;10 reps;Supine Heel Slides: AAROM;Left;20 reps;Supine Hip ABduction/ADduction: AAROM;Left;15 reps;Supine    General Comments        Pertinent Vitals/Pain Pain Assessment: 0-10 Pain Score: 6  Pain Location: L hip Pain Descriptors / Indicators: Aching;Sore Pain Intervention(s): Limited activity within patient's tolerance;Monitored during session;Premedicated before session;Ice applied    Home Living                      Prior Function            PT Goals (current goals can now be found in the care plan section) Acute Rehab PT Goals Patient Stated Goal: Rehab to regain strength and IND  PT Goal Formulation: With patient Time For Goal Achievement: 11/23/16 Potential to Achieve Goals: Good Progress towards PT goals: Progressing toward goals    Frequency    7X/week      PT Plan Current plan remains appropriate    Co-evaluation              AM-PAC PT "6 Clicks" Daily Activity  Outcome Measure  Difficulty turning over in bed (including adjusting bedclothes, sheets and blankets)?: Unable Difficulty moving from lying on back to sitting on the side of the bed? : Unable Difficulty sitting  down on and standing up from a chair with arms (e.g., wheelchair, bedside commode, etc,.)?: Unable Help needed moving to and from a bed to chair (including a wheelchair)?: A Little Help needed walking in hospital room?: A Little Help needed climbing 3-5 steps with a railing? : A Lot 6 Click Score: 11    End of Session Equipment Utilized During Treatment: Gait belt Activity Tolerance: Patient tolerated treatment well Patient left: in chair;with call bell/phone within reach Nurse Communication: Mobility status PT Visit Diagnosis: Unsteadiness on feet (R26.81);Difficulty in walking, not elsewhere classified (R26.2)     Time: 0930-1002 PT Time Calculation (min) (ACUTE ONLY): 32 min  Charges:  $Gait Training:  8-22 mins $Therapeutic Exercise: 8-22 mins                    G Codes:       Pg [phone]    Lillybeth Tal 11/21/2016, 12:06 PM

## 2016-11-21 NOTE — Clinical Social Work Placement (Signed)
   CLINICAL SOCIAL WORK PLACEMENT  NOTE  Date:  11/21/2016  Patient Details  Name: [name] [name] MRN: [id number] Date of Birth: 1941-01-07  Clinical Social Work is seeking post-discharge placement for this patient at the [place] level of care (*CSW will initial, date and re-position this form in  chart as items are completed):  Yes   Patient/family provided with [facility] Work Department's list of facilities offering this level of care within the geographic area requested by the patient (or if unable, by the patient's family).  Yes   Patient/family informed of their freedom to choose among providers that offer the needed level of care, that participate in Medicare, Medicaid or managed care program needed by the patient, have an available bed and are willing to accept the patient.  Yes   Patient/family informed of [facility]'s ownership interest in [facility] and [facility], as well as of the fact that they are under no obligation to receive care at these facilities.  PASRR submitted to EDS on 11/19/16     PASRR number received on 11/19/16     Existing PASRR number confirmed on       FL2 transmitted to all facilities in geographic area requested by pt/family on 11/19/16     FL2 transmitted to all facilities within larger geographic area on       Patient informed that his/her managed care company has contracts with or will negotiate with certain facilities, including the following:        Yes   Patient/family informed of bed offers received.  Patient chooses bed at [place] [place]     Physician recommends and patient chooses bed at      Patient to be transferred to [place] [place] on 11/21/16.  Patient to be transferred to facility by family     Patient family notified on 11/21/16 of transfer.  Name of family member notified:        PHYSICIAN Please sign FL2     Additional Comment:     _______________________________________________ Jorge Ny, LCSW 11/21/2016, 10:10 AM

## 2016-11-21 NOTE — Progress Notes (Signed)
Patient will discharge to [facility] Anticipated discharge date: 9/22 Family notified: at bedside Transportation by family  CSW signing off.  Jorge Ny MSW, LCSW [facility] #: [phone]

## 2016-11-24 DIAGNOSIS — Z471 Aftercare following joint replacement surgery: Secondary | ICD-10-CM | POA: Diagnosis not present

## 2016-11-24 DIAGNOSIS — Z96642 Presence of left artificial hip joint: Secondary | ICD-10-CM | POA: Diagnosis not present

## 2016-12-01 DIAGNOSIS — Z96642 Presence of left artificial hip joint: Secondary | ICD-10-CM | POA: Diagnosis not present

## 2016-12-01 DIAGNOSIS — Z471 Aftercare following joint replacement surgery: Secondary | ICD-10-CM | POA: Diagnosis not present

## 2016-12-06 DIAGNOSIS — M25552 Pain in left hip: Secondary | ICD-10-CM | POA: Diagnosis not present

## 2016-12-06 DIAGNOSIS — Z96642 Presence of left artificial hip joint: Secondary | ICD-10-CM | POA: Diagnosis not present

## 2016-12-06 DIAGNOSIS — K2950 Unspecified chronic gastritis without bleeding: Secondary | ICD-10-CM | POA: Diagnosis not present

## 2016-12-06 DIAGNOSIS — I10 Essential (primary) hypertension: Secondary | ICD-10-CM | POA: Diagnosis not present

## 2016-12-06 DIAGNOSIS — R7303 Prediabetes: Secondary | ICD-10-CM | POA: Diagnosis not present

## 2016-12-06 DIAGNOSIS — Z87440 Personal history of urinary (tract) infections: Secondary | ICD-10-CM | POA: Diagnosis not present

## 2016-12-06 DIAGNOSIS — K222 Esophageal obstruction: Secondary | ICD-10-CM | POA: Diagnosis not present

## 2016-12-06 DIAGNOSIS — M1990 Unspecified osteoarthritis, unspecified site: Secondary | ICD-10-CM | POA: Diagnosis not present

## 2016-12-06 DIAGNOSIS — E785 Hyperlipidemia, unspecified: Secondary | ICD-10-CM | POA: Diagnosis not present

## 2016-12-06 DIAGNOSIS — T8489XD Other specified complication of internal orthopedic prosthetic devices, implants and grafts, subsequent encounter: Secondary | ICD-10-CM | POA: Diagnosis not present

## 2016-12-06 DIAGNOSIS — K219 Gastro-esophageal reflux disease without esophagitis: Secondary | ICD-10-CM | POA: Diagnosis not present

## 2016-12-07 ENCOUNTER — Other Ambulatory Visit: Payer: Self-pay | Admitting: *Deleted

## 2016-12-07 NOTE — Patient Outreach (Signed)
[facility]) Care Management  12/07/2016  [name] [name] Jun 03, 1940 [id number]   Notified by Derenda Mis, SW at facility, patient discharged home [facility]. No [facility] care management needs identified at this time.  Plan to sign off. Royetta Crochet. Laymond Purser, RN, BSN, [place] [phone]) Business Cell  [phone]) Toll Free Office

## 2016-12-08 ENCOUNTER — Ambulatory Visit: Payer: Self-pay | Admitting: Internal Medicine

## 2016-12-09 DIAGNOSIS — I10 Essential (primary) hypertension: Secondary | ICD-10-CM | POA: Diagnosis not present

## 2016-12-09 DIAGNOSIS — R7303 Prediabetes: Secondary | ICD-10-CM | POA: Diagnosis not present

## 2016-12-09 DIAGNOSIS — M1990 Unspecified osteoarthritis, unspecified site: Secondary | ICD-10-CM | POA: Diagnosis not present

## 2016-12-09 DIAGNOSIS — K222 Esophageal obstruction: Secondary | ICD-10-CM | POA: Diagnosis not present

## 2016-12-09 DIAGNOSIS — M25552 Pain in left hip: Secondary | ICD-10-CM | POA: Diagnosis not present

## 2016-12-09 DIAGNOSIS — T8489XD Other specified complication of internal orthopedic prosthetic devices, implants and grafts, subsequent encounter: Secondary | ICD-10-CM | POA: Diagnosis not present

## 2016-12-09 DIAGNOSIS — Z96642 Presence of left artificial hip joint: Secondary | ICD-10-CM | POA: Diagnosis not present

## 2016-12-09 DIAGNOSIS — E785 Hyperlipidemia, unspecified: Secondary | ICD-10-CM | POA: Diagnosis not present

## 2016-12-09 DIAGNOSIS — Z87440 Personal history of urinary (tract) infections: Secondary | ICD-10-CM | POA: Diagnosis not present

## 2016-12-09 DIAGNOSIS — K219 Gastro-esophageal reflux disease without esophagitis: Secondary | ICD-10-CM | POA: Diagnosis not present

## 2016-12-09 DIAGNOSIS — K2950 Unspecified chronic gastritis without bleeding: Secondary | ICD-10-CM | POA: Diagnosis not present

## 2016-12-10 DIAGNOSIS — K222 Esophageal obstruction: Secondary | ICD-10-CM | POA: Diagnosis not present

## 2016-12-10 DIAGNOSIS — M25552 Pain in left hip: Secondary | ICD-10-CM | POA: Diagnosis not present

## 2016-12-10 DIAGNOSIS — K219 Gastro-esophageal reflux disease without esophagitis: Secondary | ICD-10-CM | POA: Diagnosis not present

## 2016-12-10 DIAGNOSIS — R7303 Prediabetes: Secondary | ICD-10-CM | POA: Diagnosis not present

## 2016-12-10 DIAGNOSIS — Z87440 Personal history of urinary (tract) infections: Secondary | ICD-10-CM | POA: Diagnosis not present

## 2016-12-10 DIAGNOSIS — E785 Hyperlipidemia, unspecified: Secondary | ICD-10-CM | POA: Diagnosis not present

## 2016-12-10 DIAGNOSIS — M1990 Unspecified osteoarthritis, unspecified site: Secondary | ICD-10-CM | POA: Diagnosis not present

## 2016-12-10 DIAGNOSIS — T8489XD Other specified complication of internal orthopedic prosthetic devices, implants and grafts, subsequent encounter: Secondary | ICD-10-CM | POA: Diagnosis not present

## 2016-12-10 DIAGNOSIS — I10 Essential (primary) hypertension: Secondary | ICD-10-CM | POA: Diagnosis not present

## 2016-12-10 DIAGNOSIS — Z96642 Presence of left artificial hip joint: Secondary | ICD-10-CM | POA: Diagnosis not present

## 2016-12-10 DIAGNOSIS — K2950 Unspecified chronic gastritis without bleeding: Secondary | ICD-10-CM | POA: Diagnosis not present

## 2016-12-14 DIAGNOSIS — R7303 Prediabetes: Secondary | ICD-10-CM | POA: Diagnosis not present

## 2016-12-14 DIAGNOSIS — I10 Essential (primary) hypertension: Secondary | ICD-10-CM | POA: Diagnosis not present

## 2016-12-14 DIAGNOSIS — E785 Hyperlipidemia, unspecified: Secondary | ICD-10-CM | POA: Diagnosis not present

## 2016-12-14 DIAGNOSIS — K2950 Unspecified chronic gastritis without bleeding: Secondary | ICD-10-CM | POA: Diagnosis not present

## 2016-12-14 DIAGNOSIS — M25552 Pain in left hip: Secondary | ICD-10-CM | POA: Diagnosis not present

## 2016-12-14 DIAGNOSIS — Z87440 Personal history of urinary (tract) infections: Secondary | ICD-10-CM | POA: Diagnosis not present

## 2016-12-14 DIAGNOSIS — M1990 Unspecified osteoarthritis, unspecified site: Secondary | ICD-10-CM | POA: Diagnosis not present

## 2016-12-14 DIAGNOSIS — Z96642 Presence of left artificial hip joint: Secondary | ICD-10-CM | POA: Diagnosis not present

## 2016-12-14 DIAGNOSIS — K222 Esophageal obstruction: Secondary | ICD-10-CM | POA: Diagnosis not present

## 2016-12-14 DIAGNOSIS — K219 Gastro-esophageal reflux disease without esophagitis: Secondary | ICD-10-CM | POA: Diagnosis not present

## 2016-12-14 DIAGNOSIS — T8489XD Other specified complication of internal orthopedic prosthetic devices, implants and grafts, subsequent encounter: Secondary | ICD-10-CM | POA: Diagnosis not present

## 2016-12-15 DIAGNOSIS — M1990 Unspecified osteoarthritis, unspecified site: Secondary | ICD-10-CM | POA: Diagnosis not present

## 2016-12-15 DIAGNOSIS — K219 Gastro-esophageal reflux disease without esophagitis: Secondary | ICD-10-CM | POA: Diagnosis not present

## 2016-12-15 DIAGNOSIS — K222 Esophageal obstruction: Secondary | ICD-10-CM | POA: Diagnosis not present

## 2016-12-15 DIAGNOSIS — I10 Essential (primary) hypertension: Secondary | ICD-10-CM | POA: Diagnosis not present

## 2016-12-15 DIAGNOSIS — Z96642 Presence of left artificial hip joint: Secondary | ICD-10-CM | POA: Diagnosis not present

## 2016-12-15 DIAGNOSIS — Z87440 Personal history of urinary (tract) infections: Secondary | ICD-10-CM | POA: Diagnosis not present

## 2016-12-15 DIAGNOSIS — M25552 Pain in left hip: Secondary | ICD-10-CM | POA: Diagnosis not present

## 2016-12-15 DIAGNOSIS — R7303 Prediabetes: Secondary | ICD-10-CM | POA: Diagnosis not present

## 2016-12-15 DIAGNOSIS — T8489XD Other specified complication of internal orthopedic prosthetic devices, implants and grafts, subsequent encounter: Secondary | ICD-10-CM | POA: Diagnosis not present

## 2016-12-15 DIAGNOSIS — E785 Hyperlipidemia, unspecified: Secondary | ICD-10-CM | POA: Diagnosis not present

## 2016-12-15 DIAGNOSIS — K2950 Unspecified chronic gastritis without bleeding: Secondary | ICD-10-CM | POA: Diagnosis not present

## 2016-12-17 DIAGNOSIS — I10 Essential (primary) hypertension: Secondary | ICD-10-CM | POA: Diagnosis not present

## 2016-12-17 DIAGNOSIS — T8489XD Other specified complication of internal orthopedic prosthetic devices, implants and grafts, subsequent encounter: Secondary | ICD-10-CM | POA: Diagnosis not present

## 2016-12-17 DIAGNOSIS — R7303 Prediabetes: Secondary | ICD-10-CM | POA: Diagnosis not present

## 2016-12-17 DIAGNOSIS — E785 Hyperlipidemia, unspecified: Secondary | ICD-10-CM | POA: Diagnosis not present

## 2016-12-17 DIAGNOSIS — Z87440 Personal history of urinary (tract) infections: Secondary | ICD-10-CM | POA: Diagnosis not present

## 2016-12-17 DIAGNOSIS — K2950 Unspecified chronic gastritis without bleeding: Secondary | ICD-10-CM | POA: Diagnosis not present

## 2016-12-17 DIAGNOSIS — Z96642 Presence of left artificial hip joint: Secondary | ICD-10-CM | POA: Diagnosis not present

## 2016-12-17 DIAGNOSIS — K222 Esophageal obstruction: Secondary | ICD-10-CM | POA: Diagnosis not present

## 2016-12-17 DIAGNOSIS — M25552 Pain in left hip: Secondary | ICD-10-CM | POA: Diagnosis not present

## 2016-12-17 DIAGNOSIS — K219 Gastro-esophageal reflux disease without esophagitis: Secondary | ICD-10-CM | POA: Diagnosis not present

## 2016-12-17 DIAGNOSIS — M1990 Unspecified osteoarthritis, unspecified site: Secondary | ICD-10-CM | POA: Diagnosis not present

## 2016-12-21 DIAGNOSIS — I10 Essential (primary) hypertension: Secondary | ICD-10-CM | POA: Diagnosis not present

## 2016-12-21 DIAGNOSIS — T8489XD Other specified complication of internal orthopedic prosthetic devices, implants and grafts, subsequent encounter: Secondary | ICD-10-CM | POA: Diagnosis not present

## 2016-12-21 DIAGNOSIS — Z96642 Presence of left artificial hip joint: Secondary | ICD-10-CM | POA: Diagnosis not present

## 2016-12-21 DIAGNOSIS — E785 Hyperlipidemia, unspecified: Secondary | ICD-10-CM | POA: Diagnosis not present

## 2016-12-21 DIAGNOSIS — M25552 Pain in left hip: Secondary | ICD-10-CM | POA: Diagnosis not present

## 2016-12-21 DIAGNOSIS — K2950 Unspecified chronic gastritis without bleeding: Secondary | ICD-10-CM | POA: Diagnosis not present

## 2016-12-21 DIAGNOSIS — K222 Esophageal obstruction: Secondary | ICD-10-CM | POA: Diagnosis not present

## 2016-12-21 DIAGNOSIS — Z87440 Personal history of urinary (tract) infections: Secondary | ICD-10-CM | POA: Diagnosis not present

## 2016-12-21 DIAGNOSIS — R7303 Prediabetes: Secondary | ICD-10-CM | POA: Diagnosis not present

## 2016-12-21 DIAGNOSIS — K219 Gastro-esophageal reflux disease without esophagitis: Secondary | ICD-10-CM | POA: Diagnosis not present

## 2016-12-21 DIAGNOSIS — M1990 Unspecified osteoarthritis, unspecified site: Secondary | ICD-10-CM | POA: Diagnosis not present

## 2016-12-22 DIAGNOSIS — Z96642 Presence of left artificial hip joint: Secondary | ICD-10-CM | POA: Diagnosis not present

## 2016-12-22 DIAGNOSIS — Z471 Aftercare following joint replacement surgery: Secondary | ICD-10-CM | POA: Diagnosis not present

## 2016-12-24 DIAGNOSIS — K2950 Unspecified chronic gastritis without bleeding: Secondary | ICD-10-CM | POA: Diagnosis not present

## 2016-12-24 DIAGNOSIS — T8489XD Other specified complication of internal orthopedic prosthetic devices, implants and grafts, subsequent encounter: Secondary | ICD-10-CM | POA: Diagnosis not present

## 2016-12-24 DIAGNOSIS — Z96642 Presence of left artificial hip joint: Secondary | ICD-10-CM | POA: Diagnosis not present

## 2016-12-24 DIAGNOSIS — M1990 Unspecified osteoarthritis, unspecified site: Secondary | ICD-10-CM | POA: Diagnosis not present

## 2016-12-24 DIAGNOSIS — E785 Hyperlipidemia, unspecified: Secondary | ICD-10-CM | POA: Diagnosis not present

## 2016-12-24 DIAGNOSIS — K222 Esophageal obstruction: Secondary | ICD-10-CM | POA: Diagnosis not present

## 2016-12-24 DIAGNOSIS — Z87440 Personal history of urinary (tract) infections: Secondary | ICD-10-CM | POA: Diagnosis not present

## 2016-12-24 DIAGNOSIS — R7303 Prediabetes: Secondary | ICD-10-CM | POA: Diagnosis not present

## 2016-12-24 DIAGNOSIS — K219 Gastro-esophageal reflux disease without esophagitis: Secondary | ICD-10-CM | POA: Diagnosis not present

## 2016-12-24 DIAGNOSIS — M25552 Pain in left hip: Secondary | ICD-10-CM | POA: Diagnosis not present

## 2016-12-24 DIAGNOSIS — I10 Essential (primary) hypertension: Secondary | ICD-10-CM | POA: Diagnosis not present

## 2016-12-28 ENCOUNTER — Encounter: Payer: Self-pay | Admitting: Gastroenterology

## 2016-12-28 ENCOUNTER — Ambulatory Visit (INDEPENDENT_AMBULATORY_CARE_PROVIDER_SITE_OTHER): Payer: Medicare Other | Admitting: Gastroenterology

## 2016-12-28 VITALS — BP 126/80 | HR 70 | Ht 71.0 in | Wt 258.5 lb

## 2016-12-28 DIAGNOSIS — Z1211 Encounter for screening for malignant neoplasm of colon: Secondary | ICD-10-CM

## 2016-12-28 DIAGNOSIS — R1319 Other dysphagia: Secondary | ICD-10-CM

## 2016-12-28 MED ORDER — RANITIDINE HCL 150 MG PO TABS: 150.0000 mg | ORAL_TABLET | Freq: Every day | ORAL | 3 refills | Status: DC

## 2016-12-28 MED ORDER — ESOMEPRAZOLE MAGNESIUM 40 MG PO CPDR: 40.0000 mg | DELAYED_RELEASE_CAPSULE | Freq: Every day | ORAL | 3 refills | Status: DC

## 2016-12-28 NOTE — Patient Instructions (Addendum)
You have been scheduled for a Barium Esophogram at [facility] Radiology (1st floor of the hospital) on ______ at ______. Please arrive 15 minutes prior to your appointment for registration. Make certain not to have anything to eat or drink 6 hours prior to your test. If you need to reschedule for any reason, please contact radiology at [phone] to do so.  PER YOUR REQUEST YOU DID NOT WANT TO SCHEDULE BARIUM ESOPHAGRAM AND WILL CALL THEM DIRECTLY AT THE NUMBER PROVIDED TO SCHEDULE __________________________________________________________________ A barium swallow is an examination that concentrates on views of the esophagus. This tends to be a double contrast exam (barium and two liquids which, when combined, create a gas to distend the wall of the oesophagus) or single contrast (non-ionic iodine based). The study is usually tailored to your symptoms so a good history is essential. Attention is paid during the study to the form, structure and configuration of the esophagus, looking for functional disorders (such as aspiration, dysphagia, achalasia, motility and reflux) EXAMINATION You may be asked to change into a gown, depending on the type of swallow being performed. A radiologist and radiographer will perform the procedure. The radiologist will advise you of the type of contrast selected for your procedure and direct you during the exam. You will be asked to stand, sit or lie in several different positions and to hold a small amount of fluid in your mouth before being asked to swallow while the imaging is performed .In some instances you may be asked to swallow barium coated marshmallows to assess the motility of a solid food bolus. The exam can be recorded as a digital or video fluoroscopy procedure. POST PROCEDURE It will take 1-2 days for the barium to pass through your system. To facilitate this, it is important, unless otherwise directed, to increase your fluids for the next 24-48hrs and to  resume your normal diet.  This test typically takes about 30 minutes to perform. __________________________________________________________________________________   Your provider has ordered Cologuard testing as an option for colon cancer screening. This is performed by [employer] and may be out of network with your insurance. PRIOR to completing the test, it is YOUR responsibility to contact your insurance about covered benefits for this test. Your out of pocket expense could be anywhere from $0.00 to $649.00.   When you call to check coverage with your insurer, please provide the following information:   -The ONLY provider of Cologuard is [place] code for Cologuard is [phone].  [employer] NPI # [id number]  -[employer] Tax ID # [id number]   We have already sent your demographic and insurance information to [employer] (phone number [phone]) and they should contact you within the next week regarding your test. If you have not heard from them within the next week, please call our office at [phone].    We will send in Nexium and zantac to your pharmacy

## 2016-12-28 NOTE — Progress Notes (Signed)
[name] [name]    [id number]    1940-12-26  Primary Care Physician:[name], [name], MD  Referring Physician: Unk Pinto, MD [address] [place], [place] [address]  Chief complaint:  Dysphagia  HPI:  76 year old male with history of chronic GERD previously followed by Dr. [name], status post multiple EGDs most recent Jul 21, 2010 with distal esophageal stricture dilated to 18 mm with Maloney, no heme or resistance.  Last colonoscopy in December 2006 showed left-sided diverticulosis and diminutive hyperplastic rectal polyp Recently hospitalized September 2018 after a failed total hip replacement and had to be redone. Patient is having intermittent dysphagia with both solids and liquids. No heartburn with occasional regurgitation. He doesn't think he noticed any improvement after EGD's with dilation, if any was very transient. Patient is very reluctant to undergo any procedure as he had to redo every surgery he had this year.  Denies any nausea, vomiting, abdominal pain, melena or bright red blood per rectum December 2006 Colonoscopy: left side diverticulosis, hyperplastic rectal polyp, otherwise normal .    Outpatient Encounter Prescriptions as of 12/28/2016  Medication Sig  . acetaminophen (TYLENOL) 325 MG tablet Take 2 tablets (650 mg total) by mouth every 6 (six) hours as needed for mild pain (or Fever >/= 101).  . bisacodyl (DULCOLAX) 10 MG suppository Place 1 suppository (10 mg total) rectally daily as needed for moderate constipation.  . bisoprolol-hydrochlorothiazide (ZIAC) 5-6.25 MG tablet TAKE 1 TABLET BY MOUTH EVERY MORNING FOR BLOOD PRESSURE  . diphenhydrAMINE (BENADRYL) 12.5 MG/5ML elixir Take 5-10 mLs (12.5-25 mg total) by mouth every 4 (four) hours as needed for itching.  . docusate sodium (COLACE) 100 MG capsule Take 1 capsule (100 mg total) by mouth 2 (two) times daily.  . fenofibrate (TRICOR) 145 MG tablet TAKE 1 TABLET BY MOUTH DAILY  FOR TRIGLYCERIDES/BLOOD FATS  . finasteride (PROSCAR) 5 MG tablet Take 5 mg by mouth daily.  . hydrocortisone (ANUSOL-HC) 2.5 % rectal cream Apply rectally 2 times daily (Patient not taking: Reported on 11/18/2016)  . hydrocortisone (ANUSOL-HC) 25 MG suppository Place 1 suppository (25 mg total) rectally every 12 (twelve) hours. (Patient not taking: Reported on 11/18/2016)  . losartan (COZAAR) 100 MG tablet TAKE 1 TABLET BY MOUTH DAILY FOR BLOOD PRESSURE.  . magnesium oxide (MAG-OX) 400 MG tablet Take 400 mg by mouth daily.   . methocarbamol (ROBAXIN) 500 MG tablet Take 1 tablet (500 mg total) by mouth every 6 (six) hours as needed for muscle spasms.  Marland Kitchen omeprazole (PRILOSEC) 40 MG capsule TAKE 1 CAPSULE BY MOUTH DAILY.  Marland Kitchen ondansetron (ZOFRAN) 4 MG tablet Take 1 tablet (4 mg total) by mouth every 6 (six) hours as needed for nausea.  Marland Kitchen oxybutynin (DITROPAN) 5 MG tablet Take 1 tablet (5 mg total) by mouth every 8 (eight) hours as needed for bladder spasms. (Patient not taking: Reported on 11/18/2016)  . oxyCODONE (OXY IR/ROXICODONE) 5 MG immediate release tablet Take 1-2 tablets (5-10 mg total) by mouth every 4 (four) hours as needed for moderate pain or severe pain.  . polyethylene glycol (MIRALAX / GLYCOLAX) packet Take 17 g by mouth daily as needed for mild constipation.  . rivaroxaban (XARELTO) 10 MG TABS tablet Take 1 tablet (10 mg total) by mouth daily with breakfast. Take Xarelto for two and a half more weeks following discharge from the hospital, then discontinue Xarelto. Once the patient has completed the Xarelto, they may resume the 81 mg  Aspirin.  . sodium phosphate (FLEET) 7-19 GM/118ML ENEM Place 133 mLs (1 enema total) rectally once as needed for severe constipation.  . tamsulosin (FLOMAX) 0.4 MG CAPS capsule Take 0.4 mg by mouth daily after breakfast.   . traMADol (ULTRAM) 50 MG tablet Take 1-2 tablets (50-100 mg total) by mouth every 6 (six) hours as needed for moderate pain.   No  facility-administered encounter medications on file as of 12/28/2016.     Allergies as of 12/28/2016 - Review Complete 11/18/2016  Allergen Reaction Noted  . Iohexol Shortness Of Breath 07/11/2010  . Ace inhibitors Cough 03/12/2013    Past Medical History:  Diagnosis Date  . Arthritis   . ED (erectile dysfunction)   . Enlarged prostate with lower urinary tract symptoms (LUTS)   . GERD (gastroesophageal reflux disease)   . History of chronic gastritis   . History of colon polyps    hyperplastic 2006  . History of esophageal stricture    S/P  DILATATION 2009; 2010; 2010 2012  . History of kidney stones    hx. multiple kidney stones  . History of kidney stones   . History of primary hyperparathyroidism    s/p  left superior parathyroidectomy 07-10-2014  . Hyperlipidemia   . Hypertension   . Left ureteral stone   . Nephrolithiasis    left side   . Pre-diabetes     Past Surgical History:  Procedure Laterality Date  . COLONOSCOPY  02/17/2005  . CYSTO/  LEFT RETROGRADE PYELOGRAM/ STENT PLACEMENT  01/26/2005  . CYSTOSCOPY W/ URETEROSCOPY W/ LITHOTRIPSY Left 05/04/2005  . CYSTOSCOPY WITH STENT PLACEMENT Left 12/30/2015   Procedure: CYSTOSCOPY WITH STENT PLACEMENT;  Surgeon: Franchot Gallo, MD;  [facility];  Service: Urology;  Laterality: Left;  . CYSTOSCOPY/RETROGRADE/URETEROSCOPY/STONE EXTRACTION WITH BASKET Left 12/30/2015   Procedure: CYSTOSCOPY/RETROGRADE/URETEROSCOPY/STONE EXTRACTION WITH BASKET;  Surgeon: Franchot Gallo, MD;  Location[facility];  Service: Urology;  Laterality: Left;  . CYSTOSCOPY/URETEROSCOPY/HOLMIUM LASER/STENT PLACEMENT Left 03/09/2016   Procedure: CYSTOSCOPY/RETROGRADE PYELOGRAM/URETEROSCOPY/BASKET STONE EXTRACTION/STENT PLACEMENT;  Surgeon: Franchot Gallo, MD;  Location: [facility];  Service: Urology;  Laterality: Left;  . ESOPHAGOGASTRODUODENOSCOPY (EGD) WITH ESOPHAGEAL DILATION  x4  last one 07-21-2010  .  HOLMIUM LASER APPLICATION Left 47/82/9562   Procedure: HOLMIUM LASER APPLICATION;  Surgeon: Franchot Gallo, MD;  Location: [facility];  Service: Urology;  Laterality: Left;  . INGUINAL HERNIA REPAIR Left yrs ago  . NEPHROLITHOTOMY Left 02/01/2014   Procedure: NEPHROLITHOTOMY PERCUTANEOUS;  Surgeon: Jorja Loa, MD;  Location: [facility];  Service: Urology;  Laterality: Left;  . PARATHYROIDECTOMY Left 07/10/2014   Procedure: LEFT SUPERIOR PARATHYROIDECTOMY;  Surgeon: Armandina Gemma, MD;  Location: [facility];  Service: General;  Laterality: Left;  . TOTAL HIP ARTHROPLASTY Left 06/24/2016   Procedure: LEFT TOTAL HIP ARTHROPLASTY ANTERIOR APPROACH;  Surgeon: Gaynelle Arabian, MD;  Location: [facility];  Service: Orthopedics;  Laterality: Left;  . TOTAL HIP REVISION Left 11/18/2016   Procedure: Left femoral revision - posterior approach;  Surgeon: Gaynelle Arabian, MD;  Location: [facility];  Service: Orthopedics;  Laterality: Left;  . tumor ear Left age 22    topical growth behind left ear.    No family history on file.  Social History   Social History  . Marital status: Married    Spouse name: N/A  . Number of children: N/A  . Years of education: N/A   Occupational History  . Not on file.   Social History Main Topics  .  Smoking status: Former Smoker    Years: 8.00    Quit date: 03/02/1974  . Smokeless tobacco: Never Used  . Alcohol use 0.6 - 1.2 oz/week    1 - 2 Glasses of wine per week     Comment: q afternoon  . Drug use: No  . Sexual activity: Yes   Other Topics Concern  . Not on file   Social History Narrative  . No narrative on file      Review of systems: Review of Systems  Constitutional: Negative for fever and chills.  HENT: Negative.   Eyes: Negative for blurred vision.  Respiratory: Negative for cough, shortness of breath and wheezing.   Cardiovascular: Negative for chest pain and palpitations.  Gastrointestinal: as per HPI Genitourinary: Negative for  dysuria, urgency, frequency and hematuria.  Musculoskeletal: Negative for myalgias, back pain and joint pain.  Skin: Negative for itching and rash.  Neurological: Negative for dizziness, tremors, focal weakness, seizures and loss of consciousness.  Endo/Heme/Allergies: Positive for seasonal allergies.  Psychiatric/Behavioral: Negative for depression, suicidal ideas and hallucinations.  All other systems reviewed and are negative.   Physical Exam: Vitals:   12/28/16 1312  BP: 126/80  Pulse: 70   Body mass index is 36.05 kg/m. Gen:      No acute distress HEENT:  EOMI, sclera anicteric Neck:     No masses; no thyromegaly Lungs:    Clear to auscultation bilaterally; normal respiratory effort CV:         Regular rate and rhythm; no murmurs Abd:      + bowel sounds; soft, non-tender; no palpable masses, no distension Ext:    No edema; adequate peripheral perfusion Skin:      Warm and dry; no rash Neuro: alert and oriented x 3 Psych: normal mood and affect  Data Reviewed:  Reviewed labs, radiology imaging, old records and pertinent past GI work up   Assessment and Plan/Recommendations:  75 yr M with h/o HTN, obesity, CKD, osteoarthritis s/p hip replacement that had to be redone here with c/o worsening chronic intermittent dysphagia Obtain barium esophagogram to exclude mechanical stricture, schatzki's ring ?dysmotility or GERD related Omeprazole 9m daily Discussed anti reflux measures  Due for colorectal cancer screening, average risk. No family history of colon cancer or personal history of colon polyps.  Patient is very reluctant to undergo any procedures given he had to have redo for every surgery/procedure he had in the past 1 year.  Will do Cologaurd for colorectal cancer screening, explained to patient that is if its positive will need colonoscopy to follow up and if negative repeat in 3 years.     Damaris Hippo , MD [phone] Mon-Fri 8a-5p [phone] after 5p,  weekends, holidays  CC: Unk Pinto, MD

## 2016-12-30 DIAGNOSIS — R2689 Other abnormalities of gait and mobility: Secondary | ICD-10-CM | POA: Diagnosis not present

## 2017-01-01 DIAGNOSIS — R2689 Other abnormalities of gait and mobility: Secondary | ICD-10-CM | POA: Diagnosis not present

## 2017-01-04 DIAGNOSIS — R2689 Other abnormalities of gait and mobility: Secondary | ICD-10-CM | POA: Diagnosis not present

## 2017-01-06 DIAGNOSIS — R2689 Other abnormalities of gait and mobility: Secondary | ICD-10-CM | POA: Diagnosis not present

## 2017-01-11 DIAGNOSIS — R2689 Other abnormalities of gait and mobility: Secondary | ICD-10-CM | POA: Diagnosis not present

## 2017-01-13 DIAGNOSIS — R2689 Other abnormalities of gait and mobility: Secondary | ICD-10-CM | POA: Diagnosis not present

## 2017-01-14 ENCOUNTER — Ambulatory Visit (HOSPITAL_COMMUNITY)
Admission: RE | Admit: 2017-01-14 | Discharge: 2017-01-14 | Disposition: A | Discharge status: Home / Self Care | Payer: Medicare Other | Source: Ambulatory Visit | Attending: Gastroenterology | Admitting: Gastroenterology

## 2017-01-14 DIAGNOSIS — K449 Diaphragmatic hernia without obstruction or gangrene: Secondary | ICD-10-CM | POA: Diagnosis not present

## 2017-01-14 DIAGNOSIS — R1319 Other dysphagia: Secondary | ICD-10-CM

## 2017-01-18 NOTE — Progress Notes (Deleted)
3 MONTH  FOLLOW UP Assessment and Plan:    Over 30 minutes of exam, counseling, chart review, and critical decision making was performed  Future Appointments  Date Time Provider [place]  01/19/2017  2:30 PM Vicie Mutters, PA-C [facility] None  04/22/2017  2:30 PM Unk Pinto, MD [facility] None  10/25/2017  3:00 PM Unk Pinto, MD [facility] None    Subjective:  [name] [name] is a 76 y.o. male who presents 3 month follow up for HTN, hyperlipidemia, prediabetes, and vitamin D Def.   His blood pressure has been controlled at home, today their BP is   He does not workout. He denies chest pain, shortness of breath, dizziness.  Has chronic GERD, on PPI, has had esophagus stretched 3 times.  Had cystoscopy for stone/stent placement in Oct by Dr. Diona Fanti, then he had another procedure to remove the stent in the office but they were unable to retrieve it per patient and goes this afternoon for removal.  He is suppose to have left THR with Dr. Wynelle Link, 02/20 but he had URI last Wednesday, he is feeling better but he continues to have cough but denies fever, chills, SOB, CP.  He is on cholesterol medication and has some myalgias with fenofibrate. His cholesterol is at goal. The cholesterol last visit was:   Lab Results  Component Value Date   CHOL 188 09/28/2016   HDL 50 09/28/2016   LDLCALC 104 (H) 09/28/2016   TRIG 170 (H) 09/28/2016   CHOLHDL 3.8 09/28/2016   He has been working on diet and exercise for prediabetes, and denies paresthesia of the feet, polydipsia, polyuria and visual disturbances. Last A1C in the office was:  Lab Results  Component Value Date   HGBA1C 6.0 (H) 11/11/2016  Last GFR  Lab Results  Component Value Date   GFRNONAA 54 (L) 11/20/2016  Patient is on Vitamin D supplement.   Lab Results  Component Value Date   VD25OH 81 09/28/2016     BMI is There is no height or weight on file to calculate BMI., he is working on diet and exercise.  Wt  Readings from Last 3 Encounters:  12/28/16 258 lb 8 oz (117.3 kg)  11/18/16 250 lb (113.4 kg)  11/11/16 250 lb (113.4 kg)     Medication Review: Current Outpatient Medications on File Prior to Visit  Medication Sig Dispense Refill  . acetaminophen (TYLENOL) 325 MG tablet Take 2 tablets (650 mg total) by mouth every 6 (six) hours as needed for mild pain (or Fever >/= 101). 20 tablet 0  . bisoprolol-hydrochlorothiazide (ZIAC) 5-6.25 MG tablet TAKE 1 TABLET BY MOUTH EVERY MORNING FOR BLOOD PRESSURE 90 tablet 4  . esomeprazole (NEXIUM) 40 MG capsule Take 1 capsule (40 mg total) by mouth daily at 12 noon. 30 capsule 3  . fenofibrate (TRICOR) 145 MG tablet TAKE 1 TABLET BY MOUTH DAILY FOR TRIGLYCERIDES/BLOOD FATS 90 tablet 3  . finasteride (PROSCAR) 5 MG tablet Take 5 mg by mouth daily.    Marland Kitchen losartan (COZAAR) 100 MG tablet TAKE 1 TABLET BY MOUTH DAILY FOR BLOOD PRESSURE. 90 tablet 2  . magnesium oxide (MAG-OX) 400 MG tablet Take 400 mg by mouth daily.     . meloxicam (MOBIC) 15 MG tablet daily.  1  . omeprazole (PRILOSEC) 40 MG capsule TAKE 1 CAPSULE BY MOUTH DAILY. 90 capsule 1  . ranitidine (ZANTAC) 150 MG tablet Take 1 tablet (150 mg total) by mouth at bedtime. 30 tablet 3  .  tamsulosin (FLOMAX) 0.4 MG CAPS capsule Take 0.4 mg by mouth daily after breakfast.   3   No current facility-administered medications on file prior to visit.     Current Problems (verified) Patient Active Problem List   Diagnosis Date Noted  . Failed total hip arthroplasty ([place]) 11/18/2016  . OA (osteoarthritis) of hip 06/24/2016  . CKD (chronic kidney disease) stage 3, GFR 30-59 ml/min (HCC) 03/25/2015  . Snoring 03/25/2015  . Medicare annual wellness visit, subsequent 11/27/2014  . Hyperparathyroidism, primary ([place]) 07/09/2014  . Vitamin D deficiency 06/13/2013  . Prediabetes 06/13/2013  . Medication management 06/13/2013  . Hyperlipidemia   . Hypertension   . GERD (gastroesophageal reflux disease)   .  Kidney stones   . Morbidly obese (34.39)   . Stricture and stenosis of esophagus 10/18/2008   Allergies Allergies  Allergen Reactions  . Iohexol Shortness Of Breath  . Ace Inhibitors Cough   Surgical History: reviewed and unchanged Family History: reviewed and unchanged Social History: reviewed and unchanged   Objective:   There were no vitals filed for this visit. There is no height or weight on file to calculate BMI.  General appearance: alert, no distress, WD/WN, male HEENT: normocephalic, sclerae anicteric, TMs pearly, nares patent, no discharge or erythema, pharynx normal Oral cavity: MMM, no lesions, crowded mouth Neck: supple, large neck circumference, no lymphadenopathy, no thyromegaly, no masses Heart: RRR, normal S1, S2, no murmurs Lungs: CTA bilaterally, no wheezes, rhonchi, or rales Abdomen: +bs, soft, obese, non tender, non distended, no masses, no hepatomegaly, no splenomegaly Musculoskeletal: nontender, no swelling, no obvious deformity Extremities: no edema, no cyanosis, no clubbing Pulses: 2+ symmetric, upper and lower extremities, normal cap refill Neurological: alert, oriented x 3, CN2-12 intact, strength normal upper extremities and lower extremities, sensation normal throughout, DTRs 2+ throughout, no cerebellar signs, gait antalgic Psychiatric: normal affect, behavior normal, pleasant    Vicie Mutters, PA-C   01/18/2017

## 2017-01-19 ENCOUNTER — Encounter: Payer: Self-pay | Admitting: Adult Health

## 2017-01-19 ENCOUNTER — Ambulatory Visit: Payer: Self-pay | Admitting: Physician Assistant

## 2017-01-19 ENCOUNTER — Ambulatory Visit (INDEPENDENT_AMBULATORY_CARE_PROVIDER_SITE_OTHER): Payer: Medicare Other | Admitting: Adult Health

## 2017-01-19 VITALS — BP 118/80 | HR 57 | Temp 97.9°F | Ht 71.0 in | Wt 254.0 lb

## 2017-01-19 DIAGNOSIS — E782 Mixed hyperlipidemia: Secondary | ICD-10-CM | POA: Diagnosis not present

## 2017-01-19 DIAGNOSIS — R7303 Prediabetes: Secondary | ICD-10-CM | POA: Diagnosis not present

## 2017-01-19 DIAGNOSIS — N183 Chronic kidney disease, stage 3 unspecified: Secondary | ICD-10-CM

## 2017-01-19 DIAGNOSIS — E559 Vitamin D deficiency, unspecified: Secondary | ICD-10-CM

## 2017-01-19 DIAGNOSIS — K219 Gastro-esophageal reflux disease without esophagitis: Secondary | ICD-10-CM

## 2017-01-19 DIAGNOSIS — Z79899 Other long term (current) drug therapy: Secondary | ICD-10-CM

## 2017-01-19 DIAGNOSIS — I10 Essential (primary) hypertension: Secondary | ICD-10-CM

## 2017-01-19 NOTE — Progress Notes (Signed)
FOLLOW UP  Assessment and Plan:   Hypertension Well controlled with current medications Monitor blood pressure at home; patient to call if consistently greater than 130/80 Continue DASH diet.   Reminder to go to the ER if any CP, SOB, nausea, dizziness, severe HA, changes vision/speech, left arm numbness and tingling and jaw pain.  Cholesterol Continue medication Continue low cholesterol diet and exercise.  Check lipid panel.   Prediabetes Continue diet and exercise.  Perform daily foot/skin check, notify office of any concerning changes.  Check A1C  Obesity with co morbidities Long discussion about weight loss, diet, and exercise Discussed ideal weight for height and initial weight goal (250) Patient will work on reducing carbs Will follow up in 3 months  Vitamin D Def/ osteoporosis prevention Continue supplementation Check Vit D level  CKD stage 3 Increase fluids, avoid NSAIDS, monitor sugars, will monitor  GERD/ esophageal stricture Well managed on current medications Discussed diet, avoiding triggers and other lifestyle changes   Continue diet and meds as discussed. Further disposition pending results of labs. Discussed med's effects and SE's.   Over 30 minutes of exam, counseling, chart review, and critical decision making was performed.   Future Appointments  Date Time Provider [place]  04/22/2017  2:30 PM Unk Pinto, MD [facility] None  10/25/2017  3:00 PM Unk Pinto, MD [facility] None    ----------------------------------------------------------------------------------------------------------------------  HPI 76 y.o. male  presents for 3 month follow up on hypertension, cholesterol, diabetes, weight and vitamin D deficiency. He is recovering from repeated L hip surgery from failed arthroplasty -insufficient length. Still with PT - doing well. His GERD medications were adjusted by GI and he reports symptoms are well controlled on  currently plan of nexium daily.   BMI is Body mass index is 35.43 kg/m., he has not been working on diet and exercise due to his surgeries. He reports reduced appetite, watching portions.  Wt Readings from Last 3 Encounters:  01/19/17 254 lb (115.2 kg)  12/28/16 258 lb 8 oz (117.3 kg)  11/18/16 250 lb (113.4 kg)   His blood pressure has not been controlled at home, today their BP is BP: 118/80  He does not workout. He denies chest pain, shortness of breath, dizziness.   He is on cholesterol medication and denies myalgias. His cholesterol is not at goal. The cholesterol last visit was:   Lab Results  Component Value Date   CHOL 188 09/28/2016   HDL 50 09/28/2016   LDLCALC 104 (H) 09/28/2016   TRIG 170 (H) 09/28/2016   CHOLHDL 3.8 09/28/2016    He has not been working on diet and exercise for prediabetes, and denies nausea, paresthesia of the feet, polydipsia, polyuria, visual disturbances, vomiting and weight loss. Last A1C in the office was:  Lab Results  Component Value Date   HGBA1C 6.0 (H) 11/11/2016   Patient is on Vitamin D supplement and at goal:  Lab Results  Component Value Date   VD25OH 81 09/28/2016        Current Medications:  Current Outpatient Medications on File Prior to Visit  Medication Sig  . acetaminophen (TYLENOL) 325 MG tablet Take 2 tablets (650 mg total) by mouth every 6 (six) hours as needed for mild pain (or Fever >/= 101).  Marland Kitchen esomeprazole (NEXIUM) 40 MG capsule Take 1 capsule (40 mg total) by mouth daily at 12 noon.  . fenofibrate (TRICOR) 145 MG tablet TAKE 1 TABLET BY MOUTH DAILY FOR TRIGLYCERIDES/BLOOD FATS  . finasteride (PROSCAR) 5 MG  tablet Take 5 mg by mouth daily.  Marland Kitchen losartan (COZAAR) 100 MG tablet TAKE 1 TABLET BY MOUTH DAILY FOR BLOOD PRESSURE.  . magnesium oxide (MAG-OX) 400 MG tablet Take 400 mg by mouth daily.   . meloxicam (MOBIC) 15 MG tablet daily.  . ranitidine (ZANTAC) 150 MG tablet Take 1 tablet (150 mg total) by mouth at  bedtime.  . tamsulosin (FLOMAX) 0.4 MG CAPS capsule Take 0.4 mg by mouth daily after breakfast.   . bisoprolol-hydrochlorothiazide (ZIAC) 5-6.25 MG tablet TAKE 1 TABLET BY MOUTH EVERY MORNING FOR BLOOD PRESSURE (Patient not taking: Reported on 01/19/2017)  . omeprazole (PRILOSEC) 40 MG capsule TAKE 1 CAPSULE BY MOUTH DAILY. (Patient not taking: Reported on 01/19/2017)   No current facility-administered medications on file prior to visit.      Allergies:  Allergies  Allergen Reactions  . Iohexol Shortness Of Breath  . Ace Inhibitors Cough     Medical History:  Past Medical History:  Diagnosis Date  . Arthritis   . ED (erectile dysfunction)   . Enlarged prostate with lower urinary tract symptoms (LUTS)   . GERD (gastroesophageal reflux disease)   . History of chronic gastritis   . History of colon polyps    hyperplastic 2006  . History of esophageal stricture    S/P  DILATATION 2009; 2010; 2010 2012  . History of kidney stones    hx. multiple kidney stones  . History of kidney stones   . History of primary hyperparathyroidism    s/p  left superior parathyroidectomy 07-10-2014  . Hyperlipidemia   . Hypertension   . Left ureteral stone   . Nephrolithiasis    left side   . Pre-diabetes    Family history- Reviewed and unchanged Social history- Reviewed and unchanged   Review of Systems:  Review of Systems  Constitutional: Negative for malaise/fatigue and weight loss.  HENT: Negative for hearing loss and tinnitus.   Eyes: Negative for blurred vision and double vision.  Respiratory: Negative for cough, shortness of breath and wheezing.   Cardiovascular: Negative for chest pain, palpitations, orthopnea, claudication and leg swelling.  Gastrointestinal: Negative for abdominal pain, blood in stool, constipation, diarrhea, heartburn, melena, nausea and vomiting.  Genitourinary: Negative.   Musculoskeletal: Negative for joint pain and myalgias.  Skin: Negative for rash.   Neurological: Negative for dizziness, tingling, sensory change, weakness and headaches.  Endo/Heme/Allergies: Negative for polydipsia.  Psychiatric/Behavioral: Negative.   All other systems reviewed and are negative.     Physical Exam: BP 118/80   Pulse (!) 57   Temp 97.9 F (36.6 C)   Ht 5\' 11"  (1.803 m)   Wt 254 lb (115.2 kg)   SpO2 96%   BMI 35.43 kg/m  Wt Readings from Last 3 Encounters:  01/19/17 254 lb (115.2 kg)  12/28/16 258 lb 8 oz (117.3 kg)  11/18/16 250 lb (113.4 kg)   General Appearance: Well nourished, in no apparent distress. Eyes: PERRLA, EOMs, conjunctiva no swelling or erythema Sinuses: No Frontal/maxillary tenderness ENT/Mouth: Ext aud canals clear, TMs without erythema, bulging. No erythema, swelling, or exudate on post pharynx.  Tonsils not swollen or erythematous. Hearing normal.  Neck: Supple, thyroid normal.  Respiratory: Respiratory effort normal, BS equal bilaterally without rales, rhonchi, wheezing or stridor.  Cardio: RRR with no MRGs. Brisk peripheral pulses without edema.  Abdomen: Soft, + BS.  Non tender, no guarding, rebound, hernias, masses. Lymphatics: Non tender without lymphadenopathy.  Musculoskeletal: Full ROM, 5/5 strength, normal gait, walks with  cane Skin: Warm, dry without rashes, lesions, ecchymosis.  Neuro: Cranial nerves intact. No cerebellar symptoms.  Psych: Awake and oriented X 3, normal affect, Insight and Judgment appropriate.    Izora Ribas, NP 2:35 PM [facility]

## 2017-01-19 NOTE — Patient Instructions (Signed)

## 2017-01-20 DIAGNOSIS — R2689 Other abnormalities of gait and mobility: Secondary | ICD-10-CM | POA: Diagnosis not present

## 2017-01-20 LAB — BASIC METABOLIC PANEL WITH GFR
BUN/Creatinine Ratio: 16 (calc) (ref 6–22)
BUN: 23 mg/dL (ref 7–25)
CO2: 26 mmol/L (ref 20–32)
Calcium: 9.6 mg/dL (ref 8.6–10.3)
Chloride: 104 mmol/L (ref 98–110)
Creat: 1.4 mg/dL — ABNORMAL HIGH (ref 0.70–1.18)
GFR, Est African American: 57 mL/min/{1.73_m2} — ABNORMAL LOW (ref >=60)
GFR, Est Non African American: 49 mL/min/{1.73_m2} — ABNORMAL LOW (ref >=60)
Glucose, Bld: 129 mg/dL — ABNORMAL HIGH (ref 65–99)
Potassium: 4.2 mmol/L (ref 3.5–5.3)
Sodium: 138 mmol/L (ref 135–146)

## 2017-01-20 LAB — CBC WITH DIFFERENTIAL/PLATELET
Basophils Absolute: 47 cells/uL (ref 0–200)
Basophils Relative: 0.7 %
Eosinophils Absolute: 322 cells/uL (ref 15–500)
Eosinophils Relative: 4.8 %
HCT: 37.6 % — ABNORMAL LOW (ref 38.5–50.0)
Hemoglobin: 12.7 g/dL — ABNORMAL LOW (ref 13.2–17.1)
Lymphs Abs: 1869 cells/uL (ref 850–3900)
MCH: 29.7 pg (ref 27.0–33.0)
MCHC: 33.8 g/dL (ref 32.0–36.0)
MCV: 88.1 fL (ref 80.0–100.0)
MPV: 10.9 fL (ref 7.5–12.5)
Monocytes Relative: 11.2 %
Neutro Abs: 3712 cells/uL (ref 1500–7800)
Neutrophils Relative %: 55.4 %
Platelets: 298 10*3/uL (ref 140–400)
RBC: 4.27 10*6/uL (ref 4.20–5.80)
RDW: 11.9 % (ref 11.0–15.0)
Total Lymphocyte: 27.9 %
WBC mixed population: 750 cells/uL (ref 200–950)
WBC: 6.7 10*3/uL (ref 3.8–10.8)

## 2017-01-20 LAB — HEPATIC FUNCTION PANEL
AG Ratio: 1.5 (calc) (ref 1.0–2.5)
ALT: 14 U/L (ref 9–46)
AST: 22 U/L (ref 10–35)
Albumin: 4 g/dL (ref 3.6–5.1)
Alkaline phosphatase (APISO): 56 U/L (ref 40–115)
Bilirubin, Direct: 0.1 mg/dL (ref 0.0–0.2)
Globulin: 2.7 g/dL (calc) (ref 1.9–3.7)
Indirect Bilirubin: 0.4 mg/dL (calc) (ref 0.2–1.2)
Total Bilirubin: 0.5 mg/dL (ref 0.2–1.2)
Total Protein: 6.7 g/dL (ref 6.1–8.1)

## 2017-01-20 LAB — HEMOGLOBIN A1C
Hgb A1c MFr Bld: 5.8 % of total Hgb — ABNORMAL HIGH (ref <5.7)
Mean Plasma Glucose: 120 (calc)
eAG (mmol/L): 6.6 (calc)

## 2017-01-20 LAB — LIPID PANEL
Cholesterol: 186 mg/dL (ref <200)
HDL: 49 mg/dL (ref >40)
LDL Cholesterol (Calc): 114 mg/dL (calc) — ABNORMAL HIGH
Non-HDL Cholesterol (Calc): 137 mg/dL (calc) — ABNORMAL HIGH (ref <130)
Total CHOL/HDL Ratio: 3.8 (calc) (ref <5.0)
Triglycerides: 123 mg/dL (ref <150)

## 2017-01-20 LAB — TSH: TSH: 1.74 mIU/L (ref 0.40–4.50)

## 2017-01-20 LAB — MAGNESIUM: Magnesium: 1.6 mg/dL (ref 1.5–2.5)

## 2017-01-20 LAB — VITAMIN D 25 HYDROXY (VIT D DEFICIENCY, FRACTURES): Vit D, 25-Hydroxy: 66 ng/mL (ref 30–100)

## 2017-01-24 DIAGNOSIS — Z1211 Encounter for screening for malignant neoplasm of colon: Secondary | ICD-10-CM | POA: Diagnosis not present

## 2017-01-24 DIAGNOSIS — Z1212 Encounter for screening for malignant neoplasm of rectum: Secondary | ICD-10-CM | POA: Diagnosis not present

## 2017-01-25 ENCOUNTER — Other Ambulatory Visit: Payer: Self-pay | Admitting: Physician Assistant

## 2017-01-25 DIAGNOSIS — R2689 Other abnormalities of gait and mobility: Secondary | ICD-10-CM | POA: Diagnosis not present

## 2017-01-27 DIAGNOSIS — R2689 Other abnormalities of gait and mobility: Secondary | ICD-10-CM | POA: Diagnosis not present

## 2017-02-01 ENCOUNTER — Other Ambulatory Visit: Payer: Self-pay

## 2017-02-01 ENCOUNTER — Telehealth: Payer: Self-pay | Admitting: Gastroenterology

## 2017-02-01 DIAGNOSIS — R2689 Other abnormalities of gait and mobility: Secondary | ICD-10-CM | POA: Diagnosis not present

## 2017-02-01 LAB — COLOGUARD: Cologuard: POSITIVE

## 2017-02-02 NOTE — Telephone Encounter (Signed)
Pt notified that cologuard test was positive. Pt scheduled for previsit 02/04/17@11am , colon scheduled in the [place] 02/12/17@8 :30am. Pt aware of appt.

## 2017-02-03 DIAGNOSIS — R2689 Other abnormalities of gait and mobility: Secondary | ICD-10-CM | POA: Diagnosis not present

## 2017-02-04 ENCOUNTER — Ambulatory Visit (AMBULATORY_SURGERY_CENTER): Payer: Self-pay

## 2017-02-04 VITALS — Ht 71.0 in | Wt 261.8 lb

## 2017-02-04 DIAGNOSIS — Z86010 Personal history of colonic polyps: Secondary | ICD-10-CM

## 2017-02-04 DIAGNOSIS — R195 Other fecal abnormalities: Secondary | ICD-10-CM

## 2017-02-04 MED ORDER — PEG-KCL-NACL-NASULF-NA ASC-C 140 G PO SOLR: 1.0000 | Freq: Once | ORAL | Status: AC

## 2017-02-04 NOTE — Progress Notes (Signed)
Per pt, no allergies to soy or egg products.Pt not taking any weight loss meds or using  O2 at home.  Pt refused emmi video. 

## 2017-02-08 ENCOUNTER — Encounter: Payer: Self-pay | Admitting: Gastroenterology

## 2017-02-10 ENCOUNTER — Telehealth: Payer: Self-pay | Admitting: Gastroenterology

## 2017-02-10 DIAGNOSIS — R195 Other fecal abnormalities: Secondary | ICD-10-CM

## 2017-02-10 DIAGNOSIS — R2689 Other abnormalities of gait and mobility: Secondary | ICD-10-CM | POA: Diagnosis not present

## 2017-02-10 MED ORDER — PEG-KCL-NACL-NASULF-NA ASC-C 140 G PO SOLR: 1.0000 | ORAL | 0 refills | Status: DC

## 2017-02-10 NOTE — Telephone Encounter (Signed)
Patient called back states he will come by 02/11/17 in the AM to pick up kit.

## 2017-02-10 NOTE — Telephone Encounter (Signed)
Left Message for patient and pharmacy that I have a prep  kit sample here for him to pick up today or in the morning, his colonoscopy is scheduled for Friday  

## 2017-02-10 NOTE — Telephone Encounter (Signed)
Spoke to pt's wife- needs prep sent to [facility]- sent  Wife asked about diet- pt ate some bread with seeds and some blueberries- asked if pt should RS- Instructed for him to drink a lot of fluids today - this will help wash out these foods- follow instructions for clear liquids only starting Thursday --- and then follow fri instructions as well-  Wife states she walks with a cane and asked if it was going to be difficult for her to move around up here- instructed no , not an issue    Lelan Pons PV

## 2017-02-12 ENCOUNTER — Other Ambulatory Visit: Payer: Self-pay

## 2017-02-12 ENCOUNTER — Encounter: Payer: Self-pay | Admitting: Gastroenterology

## 2017-02-12 ENCOUNTER — Ambulatory Visit (AMBULATORY_SURGERY_CENTER): Payer: Medicare Other | Admitting: Gastroenterology

## 2017-02-12 VITALS — BP 112/79 | HR 50 | Temp 97.8°F | Resp 20 | Ht 71.0 in | Wt 252.0 lb

## 2017-02-12 DIAGNOSIS — D124 Benign neoplasm of descending colon: Secondary | ICD-10-CM

## 2017-02-12 DIAGNOSIS — D122 Benign neoplasm of ascending colon: Secondary | ICD-10-CM

## 2017-02-12 DIAGNOSIS — D120 Benign neoplasm of cecum: Secondary | ICD-10-CM | POA: Diagnosis not present

## 2017-02-12 DIAGNOSIS — D125 Benign neoplasm of sigmoid colon: Secondary | ICD-10-CM

## 2017-02-12 DIAGNOSIS — K635 Polyp of colon: Secondary | ICD-10-CM

## 2017-02-12 DIAGNOSIS — R195 Other fecal abnormalities: Secondary | ICD-10-CM

## 2017-02-12 MED ORDER — SODIUM CHLORIDE 0.9 % IV SOLN: 500.0000 mL | Freq: Once | INTRAVENOUS | Status: DC

## 2017-02-12 NOTE — Progress Notes (Signed)
Report to PACU, RN, vss, BBS= Clear.  

## 2017-02-12 NOTE — Op Note (Signed)
[facility] Patient Name: [name] [name] Procedure Date: 02/12/2017 8:32 AM MRN: [id number] Endoscopist: Mauri Pole , MD Age: 76 Referring MD:  Date of Birth: [birth year] Gender: Male Account #: [id number] Procedure:                Colonoscopy Indications:              Positive Cologuard test Medicines:                Monitored Anesthesia Care Procedure:                Pre-Anesthesia Assessment:                           - Prior to the procedure, a History and Physical                            was performed, and patient medications and                            allergies were reviewed. The patient's tolerance of                            previous anesthesia was also reviewed. The risks                            and benefits of the procedure and the sedation                            options and risks were discussed with the patient.                            All questions were answered, and informed consent                            was obtained. Prior Anticoagulants: The patient has                            taken no previous anticoagulant or antiplatelet                            agents. ASA Grade Assessment: III - A patient with                            severe systemic disease. After reviewing the risks                            and benefits, the patient was deemed in                            satisfactory condition to undergo the procedure.                           After obtaining informed consent, the colonoscope  was passed under direct vision. Throughout the                            procedure, the patient's blood pressure, pulse, and                            oxygen saturations were monitored continuously. The                            Colonoscope was introduced through the anus and                            advanced to the the cecum, identified by                            appendiceal orifice and ileocecal valve.  The                            colonoscopy was performed without difficulty. The                            patient tolerated the procedure well. The quality                            of the bowel preparation was good. The ileocecal                            valve, appendiceal orifice, and rectum were                            photographed. Scope In: 8:44:25 AM Scope Out: 9:05:22 AM Scope Withdrawal Time: 0 hours 18 minutes 1 second  Total Procedure Duration: 0 hours 20 minutes 57 seconds  Findings:                 The perianal and digital rectal examinations were                            normal.                           Two carpet-like polyps were found in the ascending                            colon and cecum. The polyps were 8 to 14 mm in                            size. These polyps were removed with a piecemeal                            technique using a cold snare. Resection and                            retrieval were complete.  A 2 mm polyp was found in the transverse colon. The                            polyp was sessile. The polyp was removed with a                            cold biopsy forceps. Resection and retrieval were                            complete.                           A 6 mm polyp was found in the sigmoid colon. The                            polyp was sessile. The polyp was removed with a                            cold snare. Resection and retrieval were complete.                           Multiple small and large-mouthed diverticula were                            found in the sigmoid colon and descending colon.                           Non-bleeding internal hemorrhoids were found during                            retroflexion. The hemorrhoids were small. Complications:            No immediate complications. Estimated Blood Loss:     Estimated blood loss was minimal. Impression:               - Two 8 to 14 mm polyps in  the ascending colon and                            in the cecum, removed piecemeal using a cold snare.                            Resected and retrieved.                           - One 2 mm polyp in the transverse colon, removed                            with a cold biopsy forceps. Resected and retrieved.                           - One 6 mm polyp in the sigmoid colon, removed with  a cold snare. Resected and retrieved.                           - Diverticulosis in the sigmoid colon and in the                            descending colon.                           - Non-bleeding internal hemorrhoids. Recommendation:           - Patient has a contact number available for                            emergencies. The signs and symptoms of potential                            delayed complications were discussed with the                            patient. Return to normal activities tomorrow.                            Written discharge instructions were provided to the                            patient.                           - Resume previous diet.                           - Continue present medications.                           - Await pathology results.                           - Repeat colonoscopy in 3 years for surveillance                            based on pathology results. Mauri Pole, MD 02/12/2017 9:11:38 AM This report has been signed electronically.

## 2017-02-12 NOTE — Progress Notes (Signed)
Pt's states no medical or surgical changes since previsit or office visit. 

## 2017-02-12 NOTE — Progress Notes (Signed)
Called to room to assist during endoscopic procedure.  Patient ID and intended procedure confirmed with present staff. Received instructions for my participation in the procedure from the performing physician.  

## 2017-02-12 NOTE — Patient Instructions (Signed)
YOU HAD AN ENDOSCOPIC PROCEDURE TODAY AT THE [facility]:   Refer to the procedure report that was given to you for any specific questions about what was found during the examination.  If the procedure report does not answer your questions, please call your gastroenterologist to clarify.  If you requested that your care partner not be given the details of your procedure findings, then the procedure report has been included in a sealed envelope for you to review at your convenience later.  YOU SHOULD EXPECT: Some feelings of bloating in the abdomen. Passage of more gas than usual.  Walking can help get rid of the air that was put into your GI tract during the procedure and reduce the bloating. If you had a lower endoscopy (such as a colonoscopy or flexible sigmoidoscopy) you may notice spotting of blood in your stool or on the toilet paper. If you underwent a bowel prep for your procedure, you may not have a normal bowel movement for a few days.  Please Note:  You might notice some irritation and congestion in your nose or some drainage.  This is from the oxygen used during your procedure.  There is no need for concern and it should clear up in a day or so.  SYMPTOMS TO REPORT IMMEDIATELY:   Following lower endoscopy (colonoscopy or flexible sigmoidoscopy):  Excessive amounts of blood in the stool  Significant tenderness or worsening of abdominal pains  Swelling of the abdomen that is new, acute  Fever of 100F or higher  For urgent or emergent issues, a gastroenterologist can be reached at any hour by calling [phone].   DIET:  We do recommend a small meal at first, but then you may proceed to your regular diet.  Drink plenty of fluids but you should avoid alcoholic beverages for 24 hours.  ACTIVITY:  You should plan to take it easy for the rest of today and you should NOT DRIVE or use heavy machinery until tomorrow (because of the sedation medicines used during the test).     FOLLOW UP: Our staff will call the number listed on your records the next business day following your procedure to check on you and address any questions or concerns that you may have regarding the information given to you following your procedure. If we do not reach you, we will leave a message.  However, if you are feeling well and you are not experiencing any problems, there is no need to return our call.  We will assume that you have returned to your regular daily activities without incident.  If any biopsies were taken you will be contacted by phone or by letter within the next 1-3 weeks.  Please call us at [phone] if you have not heard about the biopsies in 3 weeks.   Await for biopsy results to determine next repeat Colonoscopy screening Polyps (handout given) Diverticulosis (handout given) Hemorrhoids (handout given)   SIGNATURES/CONFIDENTIALITY: You and/or your care partner have signed paperwork which will be entered into your electronic medical record.  These signatures attest to the fact that that the information above on your After Visit Summary has been reviewed and is understood.  Full responsibility of the confidentiality of this discharge information lies with you and/or your care-partner. 

## 2017-02-15 ENCOUNTER — Telehealth: Payer: Self-pay | Admitting: *Deleted

## 2017-02-15 ENCOUNTER — Telehealth: Payer: Self-pay

## 2017-02-15 DIAGNOSIS — R2689 Other abnormalities of gait and mobility: Secondary | ICD-10-CM | POA: Diagnosis not present

## 2017-02-15 NOTE — Telephone Encounter (Signed)
  Follow up Call-  Call back number 02/12/2017  Post procedure Call Back phone  # [phone]  Permission to leave phone message Yes  Some recent data might be hidden     Patient questions:  Do you have a fever, pain , or abdominal swelling? No. Pain Score  0 *  Have you tolerated food without any problems? Yes.    Have you been able to return to your normal activities? Yes.    Do you have any questions about your discharge instructions: Diet   No. Medications  No. Follow up visit  No.  Do you have questions or concerns about your Care? No.  Actions: * If pain score is 4 or above: No action needed, pain <4.

## 2017-02-15 NOTE — Telephone Encounter (Signed)
  Follow up Call-  Call back number 02/12/2017  Post procedure Call Back phone  # [phone]  Permission to leave phone message Yes  Some recent data might be hidden     No answer at # given.  LM on VM.

## 2017-02-17 DIAGNOSIS — R2689 Other abnormalities of gait and mobility: Secondary | ICD-10-CM | POA: Diagnosis not present

## 2017-02-25 ENCOUNTER — Encounter: Payer: Self-pay | Admitting: Gastroenterology

## 2017-03-01 ENCOUNTER — Telehealth: Payer: Self-pay | Admitting: *Deleted

## 2017-03-01 ENCOUNTER — Other Ambulatory Visit: Payer: Self-pay | Admitting: Internal Medicine

## 2017-03-01 MED ORDER — PROMETHAZINE-DM 6.25-15 MG/5ML PO SYRP: ORAL_SOLUTION | ORAL | 1 refills | Status: DC

## 2017-03-01 MED ORDER — PREDNISONE 20 MG PO TABS: ORAL_TABLET | ORAL | 0 refills | Status: DC

## 2017-03-01 NOTE — Telephone Encounter (Signed)
Patient wa informed an RX has been sent to his pharmacy by Dr Melford Aase, for is cough.

## 2017-03-03 ENCOUNTER — Encounter: Payer: Self-pay | Admitting: Adult Health

## 2017-03-03 ENCOUNTER — Ambulatory Visit (INDEPENDENT_AMBULATORY_CARE_PROVIDER_SITE_OTHER): Payer: Medicare Other | Admitting: Adult Health

## 2017-03-03 VITALS — BP 110/64 | HR 53 | Temp 97.5°F | Ht 71.0 in | Wt 261.0 lb

## 2017-03-03 DIAGNOSIS — J209 Acute bronchitis, unspecified: Secondary | ICD-10-CM

## 2017-03-03 MED ORDER — AZITHROMYCIN 250 MG PO TABS: ORAL_TABLET | ORAL | 1 refills | Status: AC

## 2017-03-03 MED ORDER — PREDNISONE 20 MG PO TABS: ORAL_TABLET | ORAL | 0 refills | Status: DC

## 2017-03-03 NOTE — Patient Instructions (Signed)
May also consider taking mucinex/guaifenesin for chest congestion  Drink plenty of fluids, use humidifier, get on daily allergy medication for 2 weeks.    Acute Bronchitis, Adult Acute bronchitis is sudden (acute) swelling of the air tubes (bronchi) in the lungs. Acute bronchitis causes these tubes to fill with mucus, which can make it hard to breathe. It can also cause coughing or wheezing. In adults, acute bronchitis usually goes away within 2 weeks. A cough caused by bronchitis may last up to 3 weeks. Smoking, allergies, and asthma can make the condition worse. Repeated episodes of bronchitis may cause further lung problems, such as chronic obstructive pulmonary disease (COPD). What are the causes? This condition can be caused by germs and by substances that irritate the lungs, including:  Cold and flu viruses. This condition is most often caused by the same virus that causes a cold.  Bacteria.  Exposure to tobacco smoke, dust, fumes, and air pollution.  What increases the risk? This condition is more likely to develop in people who:  Have close contact with someone with acute bronchitis.  Are exposed to lung irritants, such as tobacco smoke, dust, fumes, and vapors.  Have a weak immune system.  Have a respiratory condition such as asthma.  What are the signs or symptoms? Symptoms of this condition include:  A cough.  Coughing up clear, yellow, or green mucus.  Wheezing.  Chest congestion.  Shortness of breath.  A fever.  Body aches.  Chills.  A sore throat.  How is this diagnosed? This condition is usually diagnosed with a physical exam. During the exam, your health care provider may order tests, such as chest X-rays, to rule out other conditions. He or she may also:  Test a sample of your mucus for bacterial infection.  Check the level of oxygen in your blood. This is done to check for pneumonia.  Do a chest X-ray or lung function testing to rule out  pneumonia and other conditions.  Perform blood tests.  Your health care provider will also ask about your symptoms and medical history. How is this treated? Most cases of acute bronchitis clear up over time without treatment. Your health care provider may recommend:  Drinking more fluids. Drinking more makes your mucus thinner, which may make it easier to breathe.  Taking a medicine for a fever or cough.  Taking an antibiotic medicine.  Using an inhaler to help improve shortness of breath and to control a cough.  Using a cool mist vaporizer or humidifier to make it easier to breathe.  Follow these instructions at home: Medicines  Take over-the-counter and prescription medicines only as told by your health care provider.  If you were prescribed an antibiotic, take it as told by your health care provider. Do not stop taking the antibiotic even if you start to feel better. General instructions  Get plenty of rest.  Drink enough fluids to keep your urine clear or pale yellow.  Avoid smoking and secondhand smoke. Exposure to cigarette smoke or irritating chemicals will make bronchitis worse. If you smoke and you need help quitting, ask your health care provider. Quitting smoking will help your lungs heal faster.  Use an inhaler, cool mist vaporizer, or humidifier as told by your health care provider.  Keep all follow-up visits as told by your health care provider. This is important. How is this prevented? To lower your risk of getting this condition again:  Wash your hands often with soap and water. If soap  and water are not available, use hand sanitizer.  Avoid contact with people who have cold symptoms.  Try not to touch your hands to your mouth, nose, or eyes.  Make sure to get the flu shot every year.  Contact a health care provider if:  Your symptoms do not improve in 2 weeks of treatment. Get help right away if:  You cough up blood.  You have chest pain.  You  have severe shortness of breath.  You become dehydrated.  You faint or keep feeling like you are going to faint.  You keep vomiting.  You have a severe headache.  Your fever or chills gets worse. This information is not intended to replace advice given to you by your health care provider. Make sure you discuss any questions you have with your health care provider. Document Released: 03/26/2004 Document Revised: 09/11/2015 Document Reviewed: 08/07/2015 Elsevier Interactive Patient Education  [employer].

## 2017-03-03 NOTE — Progress Notes (Signed)
Assessment and Plan:  [name] [name] was seen today for uri.  Diagnoses and all orders for this visit:  Acute bronchitis, unspecified organism - Discussed the importance of avoiding unnecessary antibiotic therapy. Suggested symptomatic OTC remedies. Guaifenesin for chest congestions; start taking prescribed cough syrup Nasal steroids, allergy pill, oral steroids Follow up as needed. -     predniSONE (DELTASONE) 20 MG tablet; 1 tab 3 x day for 2 days, then 1 tab 2 x day for 2 days, then 1 tab 1 x day for 3 days -     azithromycin (ZITHROMAX) 250 MG tablet; Take 2 tablets (500 mg) on  Day 1,  followed by 1 tablet (250 mg) once daily on Days 2 through 5.  Further disposition pending results of labs. Discussed med's effects and SE's.   Over 15 minutes of exam, counseling, chart review, and critical decision making was performed.   Future Appointments  Date Time Provider [place]  04/22/2017  2:30 PM Unk Pinto, MD [facility] None  10/25/2017  3:00 PM Unk Pinto, MD [facility] None    ------------------------------------------------------------------------------------------------------------------   HPI BP 110/64   Pulse (!) 53   Temp (!) 97.5 F (36.4 C)   Ht 5\' 11"  (1.803 m)   Wt 261 lb (118.4 kg)   SpO2 97%   BMI 36.40 kg/m   77 y.o.male presents for uncontrollable cough x 7 days after he went hunting the day after Christmas - productive of small amount of green phlegm, hoarseness. He endorses mild SOB and wheezing after coughing fits. Denies HA, fever/chilss, N/V/D/abdominal, HA, dizziness, CP.   He was prescribed a steroid taper and has been taking leftover tessalon for cough without improvement. Was prescribed cough syrup but was not available at pharmacy until today; he has filled but has not had a chance to use.   He is not taking daily allergy medication; no respiratory hx; up to date on immunizations. No known sick contacts.   Past Medical History:   Diagnosis Date  . Arthritis    in left hip  . ED (erectile dysfunction)   . Enlarged prostate with lower urinary tract symptoms (LUTS)   . GERD (gastroesophageal reflux disease)   . History of chronic gastritis   . History of colon polyps    hyperplastic 2006  . History of esophageal stricture    S/P  DILATATION 2009; 2010; 2010 2012  . History of kidney stones    hx. multiple kidney stones  . History of kidney stones   . History of primary hyperparathyroidism    s/p  left superior parathyroidectomy 07-10-2014  . Hyperlipidemia   . Hypertension   . Left ureteral stone   . Nephrolithiasis    left side   . Pre-diabetes      Allergies  Allergen Reactions  . Iohexol Shortness Of Breath  . Ace Inhibitors Cough    Current Outpatient Medications on File Prior to Visit  Medication Sig  . acetaminophen (TYLENOL) 325 MG tablet Take 2 tablets (650 mg total) by mouth every 6 (six) hours as needed for mild pain (or Fever >/= 101).  Marland Kitchen aspirin EC 81 MG tablet Take 81 mg by mouth daily.  Marland Kitchen b complex vitamins tablet Take 1 tablet by mouth daily.  . bisoprolol-hydrochlorothiazide (ZIAC) 5-6.25 MG tablet TAKE 1 TABLET BY MOUTH EVERY MORNING FOR BLOOD PRESSURE  . Cholecalciferol (VITAMIN D PO) Take 5,000 Units by mouth 2 (two) times daily.  Marland Kitchen esomeprazole (NEXIUM) 40 MG capsule Take 1 capsule (40  mg total) by mouth daily at 12 noon.  . fenofibrate (TRICOR) 145 MG tablet TAKE 1 TABLET BY MOUTH DAILY FOR TRIGLYCERIDES/BLOOD FATS  . finasteride (PROSCAR) 5 MG tablet Take 5 mg by mouth daily.  . Flaxseed, Linseed, (FLAX SEEDS PO) Take by mouth daily.  Marland Kitchen losartan (COZAAR) 100 MG tablet TAKE 1 TABLET BY MOUTH DAILY FOR BLOOD PRESSURE.  . Magnesium 400 MG TABS Take by mouth daily.  . magnesium oxide (MAG-OX) 400 MG tablet Take 400 mg by mouth daily.   . meloxicam (MOBIC) 15 MG tablet daily.  . Omega-3 Fatty Acids (FISH OIL) 500 MG CAPS Take by mouth 2 (two) times daily.  . predniSONE (DELTASONE)  20 MG tablet 1 tab 3 x day for 2 days, then 1 tab 2 x day for 2 days, then 1 tab 1 x day for 3 days  . promethazine-dextromethorphan (PROMETHAZINE-DM) 6.25-15 MG/5ML syrup Take 1 to 2 tsp enery 4 hours if needed for cough  . tamsulosin (FLOMAX) 0.4 MG CAPS capsule Take 0.4 mg by mouth daily after breakfast.    No current facility-administered medications on file prior to visit.     ROS: all negative except above.   Physical Exam:  BP 110/64   Pulse (!) 53   Temp (!) 97.5 F (36.4 C)   Ht 5\' 11"  (1.803 m)   Wt 261 lb (118.4 kg)   SpO2 97%   BMI 36.40 kg/m   General Appearance: Well nourished, in no apparent distress. Eyes: PERRLA, EOMs, conjunctiva no swelling or erythema Sinuses: No Frontal/maxillary tenderness ENT/Mouth: Ext aud canals clear, TMs without erythema, bulging. No erythema, swelling, or exudate on post pharynx.  Tonsils not swollen or erythematous. Hearing normal.  Neck: Supple, thyroid normal.  Respiratory: Respiratory effort normal, BS with rhonchi over bronchioles; without rales, wheezing or stridor.  Cardio: RRR with no MRGs. Brisk peripheral pulses without edema.  Abdomen: Soft, + BS.  Non tender, no guarding, rebound, hernias, masses. Lymphatics: Non tender without lymphadenopathy.  Musculoskeletal: Full ROM, 5/5 strength, normal gait.  Skin: Warm, dry without rashes, lesions, ecchymosis.  Neuro:  Normal muscle tone, no cerebellar symptoms. Marland Kitchen  Psych: Awake and oriented X 3, normal affect, Insight and Judgment appropriate.     Izora Ribas, NP 3:42 PM [facility]

## 2017-03-09 DIAGNOSIS — R2689 Other abnormalities of gait and mobility: Secondary | ICD-10-CM | POA: Diagnosis not present

## 2017-03-11 DIAGNOSIS — M7062 Trochanteric bursitis, left hip: Secondary | ICD-10-CM | POA: Diagnosis not present

## 2017-03-11 DIAGNOSIS — Z96642 Presence of left artificial hip joint: Secondary | ICD-10-CM | POA: Diagnosis not present

## 2017-03-11 DIAGNOSIS — Z471 Aftercare following joint replacement surgery: Secondary | ICD-10-CM | POA: Diagnosis not present

## 2017-03-15 ENCOUNTER — Other Ambulatory Visit: Payer: Self-pay | Admitting: Internal Medicine

## 2017-03-15 DIAGNOSIS — M25552 Pain in left hip: Secondary | ICD-10-CM

## 2017-03-22 ENCOUNTER — Other Ambulatory Visit: Payer: Self-pay | Admitting: Internal Medicine

## 2017-03-23 ENCOUNTER — Ambulatory Visit
Admission: RE | Admit: 2017-03-23 | Discharge: 2017-03-23 | Disposition: A | Discharge status: Home / Self Care | Payer: Medicare Other | Source: Ambulatory Visit | Attending: Orthopedic Surgery | Admitting: Orthopedic Surgery

## 2017-03-23 ENCOUNTER — Other Ambulatory Visit: Payer: Self-pay | Admitting: Orthopedic Surgery

## 2017-03-23 DIAGNOSIS — M25552 Pain in left hip: Secondary | ICD-10-CM

## 2017-03-23 DIAGNOSIS — S72012A Unspecified intracapsular fracture of left femur, initial encounter for closed fracture: Secondary | ICD-10-CM | POA: Diagnosis not present

## 2017-03-24 ENCOUNTER — Other Ambulatory Visit: Payer: Self-pay | Admitting: Orthopedic Surgery

## 2017-03-24 DIAGNOSIS — M25552 Pain in left hip: Secondary | ICD-10-CM

## 2017-03-30 ENCOUNTER — Other Ambulatory Visit: Payer: Self-pay | Admitting: Orthopedic Surgery

## 2017-03-30 DIAGNOSIS — M25552 Pain in left hip: Secondary | ICD-10-CM | POA: Diagnosis not present

## 2017-03-31 ENCOUNTER — Other Ambulatory Visit: Payer: Self-pay | Admitting: Orthopedic Surgery

## 2017-04-09 NOTE — Pre-Procedure Instructions (Signed)
[name] [name]  04/09/2017      [facility] - [place], [place] [address] [place] [address] Phone: [phone] Fax: [phone]    Your procedure is scheduled on Friday February 15.  Report to [facility] Admitting at 11:00 A.M.  Call this number if you have problems the morning of surgery:  [phone]   Remember:  Do not eat food or drink liquids after midnight.  Take these medicines the morning of surgery with A SIP OF WATER:    Acetaminophen (tylenol) if needed Bisoprolol-hydrochlorothiazide (Ziac) Finasteride (proscar) Gabapentin(neurontin) Omeprazole (prilosec) Tamsulosin (flomax) Tramadol (ultram) if needed  7 days prior to surgery STOP taking any Meloxicam (mobic), Aleve, Naproxen, Ibuprofen, Motrin, Advil, Goody's, BC's, all herbal medications, fish oil, and all vitamins  **Follow your surgeon's instructions on stopping Aspirin. If no instructions were given, please call your surgeon's office**   Do not wear jewelry  Do not wear lotions, powders, or colognes, or deodorant.  Do not shave 48 hours prior to surgery.  Men may shave face and neck.  Do not bring valuables to the hospital.  [facility] is not responsible for any belongings or valuables.  Contacts, dentures or bridgework may not be worn into surgery.  Leave your suitcase in the car.  After surgery it may be brought to your room.  For patients admitted to the hospital, discharge time will be determined by your treatment team.  Patients discharged the day of surgery will not be allowed to drive home.   Special instructions:    [name]- Preparing For Surgery  Before surgery, you can play an important role. Because skin is not sterile, your skin needs to be as free of germs as possible. You can reduce the number of germs on your skin by washing with CHG (chlorahexidine gluconate) Soap before surgery.  CHG is an antiseptic cleaner which kills  germs and bonds with the skin to continue killing germs even after washing.  Please do not use if you have an allergy to CHG or antibacterial soaps. If your skin becomes reddened/irritated stop using the CHG.  Do not shave (including legs and underarms) for at least 48 hours prior to first CHG shower. It is OK to shave your face.  Please follow these instructions carefully.   1. Shower the NIGHT BEFORE SURGERY and the MORNING OF SURGERY with CHG.   2. If you chose to wash your hair, wash your hair first as usual with your normal shampoo.  3. After you shampoo, rinse your hair and body thoroughly to remove the shampoo.  4. Use CHG as you would any other liquid soap. You can apply CHG directly to the skin and wash gently with a scrungie or a clean washcloth.   5. Apply the CHG Soap to your body ONLY FROM THE NECK DOWN.  Do not use on open wounds or open sores. Avoid contact with your eyes, ears, mouth and genitals (private parts). Wash Face and genitals (private parts)  with your normal soap.  6. Wash thoroughly, paying special attention to the area where your surgery will be performed.  7. Thoroughly rinse your body with warm water from the neck down.  8. DO NOT shower/wash with your normal soap after using and rinsing off the CHG Soap.  9. Pat yourself dry with a CLEAN TOWEL.  10. Wear CLEAN PAJAMAS to bed the night before surgery, wear comfortable clothes the morning  of surgery  11. Place CLEAN SHEETS on your bed the night of your first shower and DO NOT SLEEP WITH PETS.    Day of Surgery: Do not apply any deodorants/lotions. Please wear clean clothes to the hospital/surgery center.      Please read over the following fact sheets that you were given. Coughing and Deep Breathing and Surgical Site Infection Prevention

## 2017-04-12 ENCOUNTER — Other Ambulatory Visit: Payer: Self-pay

## 2017-04-12 ENCOUNTER — Encounter (HOSPITAL_COMMUNITY)
Admission: RE | Admit: 2017-04-12 | Discharge: 2017-04-12 | Disposition: A | Discharge status: Home / Self Care | Payer: Medicare Other | Source: Ambulatory Visit | Attending: Orthopedic Surgery | Admitting: Orthopedic Surgery

## 2017-04-12 ENCOUNTER — Ambulatory Visit (HOSPITAL_COMMUNITY)
Admission: RE | Admit: 2017-04-12 | Discharge: 2017-04-12 | Disposition: A | Discharge status: Home / Self Care | Payer: Medicare Other | Source: Ambulatory Visit | Attending: Orthopedic Surgery | Admitting: Orthopedic Surgery

## 2017-04-12 ENCOUNTER — Encounter (HOSPITAL_COMMUNITY): Payer: Self-pay

## 2017-04-12 DIAGNOSIS — Z01812 Encounter for preprocedural laboratory examination: Secondary | ICD-10-CM | POA: Insufficient documentation

## 2017-04-12 DIAGNOSIS — X58XXXA Exposure to other specified factors, initial encounter: Secondary | ICD-10-CM | POA: Diagnosis not present

## 2017-04-12 DIAGNOSIS — S72092A Other fracture of head and neck of left femur, initial encounter for closed fracture: Secondary | ICD-10-CM | POA: Diagnosis not present

## 2017-04-12 DIAGNOSIS — Z01818 Encounter for other preprocedural examination: Secondary | ICD-10-CM | POA: Diagnosis not present

## 2017-04-12 DIAGNOSIS — Z01810 Encounter for preprocedural cardiovascular examination: Secondary | ICD-10-CM | POA: Diagnosis not present

## 2017-04-12 LAB — APTT: aPTT: 27 seconds (ref 24–36)

## 2017-04-12 LAB — URINALYSIS, ROUTINE W REFLEX MICROSCOPIC
Bilirubin Urine: NEGATIVE
Glucose, UA: NEGATIVE mg/dL
Hgb urine dipstick: NEGATIVE
Ketones, ur: NEGATIVE mg/dL
Leukocytes, UA: NEGATIVE
Nitrite: NEGATIVE
Protein, ur: NEGATIVE mg/dL
Specific Gravity, Urine: 1.009 (ref 1.005–1.030)
pH: 5 (ref 5.0–8.0)

## 2017-04-12 LAB — TYPE AND SCREEN
ABO/RH(D): A POS
Antibody Screen: NEGATIVE

## 2017-04-12 LAB — BASIC METABOLIC PANEL
Anion gap: 12 (ref 5–15)
BUN: 18 mg/dL (ref 6–20)
CO2: 21 mmol/L — ABNORMAL LOW (ref 22–32)
Calcium: 9.8 mg/dL (ref 8.9–10.3)
Chloride: 104 mmol/L (ref 101–111)
Creatinine, Ser: 1.51 mg/dL — ABNORMAL HIGH (ref 0.61–1.24)
GFR calc Af Amer: 51 mL/min — ABNORMAL LOW (ref >60)
GFR calc non Af Amer: 44 mL/min — ABNORMAL LOW (ref >60)
Glucose, Bld: 135 mg/dL — ABNORMAL HIGH (ref 65–99)
Potassium: 4 mmol/L (ref 3.5–5.1)
Sodium: 137 mmol/L (ref 135–145)

## 2017-04-12 LAB — CBC WITH DIFFERENTIAL/PLATELET
Basophils Absolute: 0 10*3/uL (ref 0.0–0.1)
Basophils Relative: 0 %
Eosinophils Absolute: 0.2 10*3/uL (ref 0.0–0.7)
Eosinophils Relative: 3 %
HCT: 43.8 % (ref 39.0–52.0)
Hemoglobin: 14.4 g/dL (ref 13.0–17.0)
Lymphocytes Relative: 22 %
Lymphs Abs: 1.7 10*3/uL (ref 0.7–4.0)
MCH: 28.9 pg (ref 26.0–34.0)
MCHC: 32.9 g/dL (ref 30.0–36.0)
MCV: 88 fL (ref 78.0–100.0)
Monocytes Absolute: 0.7 10*3/uL (ref 0.1–1.0)
Monocytes Relative: 9 %
Neutro Abs: 5.2 10*3/uL (ref 1.7–7.7)
Neutrophils Relative %: 66 %
Platelets: 343 10*3/uL (ref 150–400)
RBC: 4.98 MIL/uL (ref 4.22–5.81)
RDW: 14.4 % (ref 11.5–15.5)
WBC: 7.9 10*3/uL (ref 4.0–10.5)

## 2017-04-12 LAB — HEMOGLOBIN A1C
Hgb A1c MFr Bld: 6.8 % — ABNORMAL HIGH (ref 4.8–5.6)
Mean Plasma Glucose: 148.46 mg/dL

## 2017-04-12 LAB — PROTIME-INR
INR: 1.01
Prothrombin Time: 13.2 seconds (ref 11.4–15.2)

## 2017-04-12 LAB — GLUCOSE, CAPILLARY: Glucose-Capillary: 115 mg/dL — ABNORMAL HIGH (ref 65–99)

## 2017-04-12 LAB — ABO/RH: ABO/RH(D): A POS

## 2017-04-12 NOTE — Progress Notes (Signed)
PCP - Dr. Melford Aase Cardiologist - denies cardiologist or cardiac history   Chest x-ray - 04/12/2017  EKG - 09/28/16  Sleep Study - stop bang positive  Fasting Blood Sugar - pt does not check CBG at home, A1c done today as pt is "pre-diabetic"  Aspirin Instructions: no instructions given, pt states yesterday was his last dose and he would stop Aspirin unless he heard otherwise from Dr. Mayer Camel.   Patient denies shortness of breath, fever, cough and chest pain at PAT appointment   Patient verbalized understanding of instructions that were given to them at the PAT appointment. Patient was also instructed that they will need to review over the PAT instructions again at home before surgery.

## 2017-04-12 NOTE — Progress Notes (Signed)
   04/12/17 0957  OBSTRUCTIVE SLEEP APNEA  Have you ever been diagnosed with sleep apnea through a sleep study? No  Do you snore loudly (loud enough to be heard through closed doors)?  0  Do you often feel tired, fatigued, or sleepy during the daytime (such as falling asleep during driving or talking to someone)? 0  Has anyone observed you stop breathing during your sleep? 0  Do you have, or are you being treated for high blood pressure? 1  BMI more than 35 kg/m2? 1  Age > 50 (1-yes) 1  Neck circumference greater than:Male 16 inches or larger, Male 17inches or larger? 1  Male Gender (Yes=1) 1  Obstructive Sleep Apnea Score 5  Score 5 or greater  Results sent to PCP

## 2017-04-13 DIAGNOSIS — S72113A Displaced fracture of greater trochanter of unspecified femur, initial encounter for closed fracture: Secondary | ICD-10-CM | POA: Diagnosis present

## 2017-04-13 NOTE — H&P (Signed)
[name] [name] is an 77 y.o. male.   Chief Complaint:  Left hip pain  HPI: [name] [name] is here today for essentially a second opinion regarding his revision left total hip.  His primary hip was done in April 2018, using Korail prosthesis that subsequently subsided, and because of pain.  He underwent revision in September 2018 that required an extended trochanteric osteotomy with 3 cables.  Shortly after the revision he had a greater trochanteric fracture at the metaphyseal flare, which is now retracted about three fourths of an inch.  He has pain in the lateral aspect of his left hip when he goes from sitting to standing it gets better after he walks for a while but that is the main issue.  He is an active bird hunter, and the pain interferes with this activity.  He has been told by his primary surgeon that removal of the cables may give him some relief.  He denies any fevers or chills or drainage from the wound.  He was recently started on gabapentin which does not seem to help too much.  Past Medical History:  Diagnosis Date  . Arthritis    in left hip  . ED (erectile dysfunction)   . Enlarged prostate with lower urinary tract symptoms (LUTS)   . GERD (gastroesophageal reflux disease)   . History of chronic gastritis   . History of colon polyps    hyperplastic 2006  . History of esophageal stricture    S/P  DILATATION 2009; 2010; 2010 2012  . History of kidney stones    hx. multiple kidney stones  . History of kidney stones   . History of primary hyperparathyroidism    s/p  left superior parathyroidectomy 07-10-2014  . Hyperlipidemia   . Hypertension   . Left ureteral stone   . Nephrolithiasis    left side   . Pre-diabetes     Past Surgical History:  Procedure Laterality Date  . COLONOSCOPY  02/17/2005  . CYSTO/  LEFT RETROGRADE PYELOGRAM/ STENT PLACEMENT  01/26/2005  . CYSTOSCOPY W/ URETEROSCOPY W/ LITHOTRIPSY Left 05/04/2005  . CYSTOSCOPY WITH STENT PLACEMENT Left 12/30/2015   Procedure: CYSTOSCOPY WITH STENT PLACEMENT;  Surgeon: Franchot Gallo, MD;  Location: [facility];  Service: Urology;  Laterality: Left;  . CYSTOSCOPY/RETROGRADE/URETEROSCOPY/STONE EXTRACTION WITH BASKET Left 12/30/2015   Procedure: CYSTOSCOPY/RETROGRADE/URETEROSCOPY/STONE EXTRACTION WITH BASKET;  Surgeon: Franchot Gallo, MD;  Location: [facility];  Service: Urology;  Laterality: Left;  . CYSTOSCOPY/URETEROSCOPY/HOLMIUM LASER/STENT PLACEMENT Left 03/09/2016   Procedure: CYSTOSCOPY/RETROGRADE PYELOGRAM/URETEROSCOPY/BASKET STONE EXTRACTION/STENT PLACEMENT;  Surgeon: Franchot Gallo, MD;  Location: [facility];  Service: Urology;  Laterality: Left;  . ESOPHAGOGASTRODUODENOSCOPY (EGD) WITH ESOPHAGEAL DILATION  x4  last one 07-21-2010  . HOLMIUM LASER APPLICATION Left 56/38/7564   Procedure: HOLMIUM LASER APPLICATION;  Surgeon: Franchot Gallo, MD;  Location: [facility];  Service: Urology;  Laterality: Left;  . INGUINAL HERNIA REPAIR Left yrs ago  . NEPHROLITHOTOMY Left 02/01/2014   Procedure: NEPHROLITHOTOMY PERCUTANEOUS;  Surgeon: Jorja Loa, MD;  Location: [facility];  Service: Urology;  Laterality: Left;  . PARATHYROIDECTOMY Left 07/10/2014   Procedure: LEFT SUPERIOR PARATHYROIDECTOMY;  Surgeon: Armandina Gemma, MD;  Location: [facility];  Service: General;  Laterality: Left;  . TOTAL HIP ARTHROPLASTY Left 06/24/2016   Procedure: LEFT TOTAL HIP ARTHROPLASTY ANTERIOR APPROACH;  Surgeon: Gaynelle Arabian, MD;  Location: [facility];  Service: Orthopedics;  Laterality: Left;  . TOTAL HIP REVISION Left 11/18/2016   Procedure: Left femoral revision -  posterior approach;  Surgeon: Gaynelle Arabian, MD;  Location: [facility];  Service: Orthopedics;  Laterality: Left;  . tumor ear Left age 15    topical growth behind left ear.    Family History  Problem Relation Age of Onset  . Heart disease Mother   . Alzheimer's disease Mother   . Heart disease Father   . Bladder Cancer  Sister    Social History:  reports that he quit smoking about 43 years ago. He quit after 8.00 years of use. he has never used smokeless tobacco. He reports that he drinks about 0.6 - 1.2 oz of alcohol per week. He reports that he does not use drugs.  Allergies:  Allergies  Allergen Reactions  . Iohexol Shortness Of Breath  . Ace Inhibitors Cough    No medications prior to admission.    Results for orders placed or performed during the hospital encounter of 04/12/17 (from the past 48 hour(s))  Glucose, capillary     Status: Abnormal   Collection Time: 04/12/17 10:19 AM  Result Value Ref Range   Glucose-Capillary 115 (H) 65 - 99 mg/dL   Dg Chest 2 View  Result Date: 04/12/2017 CLINICAL DATA:  Preop. EXAM: CHEST  2 VIEW COMPARISON:  01/19/2014 FINDINGS: Lungs are adequately inflated without consolidation or effusion. Cardiomediastinal silhouette and remainder of the exam is unchanged. IMPRESSION: No active cardiopulmonary disease. Electronically Signed   By: Marin Olp M.D.   On: 04/12/2017 11:57    Review of Systems  Constitutional: Negative.   HENT: Negative.   Eyes: Negative.   Respiratory: Negative.   Cardiovascular: Negative.   Gastrointestinal:       Difficulty swallowing  Genitourinary: Negative.        Kidney stones  Musculoskeletal: Positive for joint pain.  Skin: Negative.   Neurological: Negative.   Endo/Heme/Allergies: Negative.   Psychiatric/Behavioral: Negative.     There were no vitals taken for this visit. Physical Exam  Constitutional: He is oriented to person, place, and time. He appears well-developed and well-nourished.  HENT:  Head: Normocephalic and atraumatic.  Eyes: Pupils are equal, round, and reactive to light.  Neck: Normal range of motion. Neck supple.  Cardiovascular: Intact distal pulses.  Respiratory: Effort normal.  Musculoskeletal: He exhibits tenderness.  Patient ambulates with a cane and a Trendelenburg gait and has pain  secondary to weakness with the abductors.  Wound is well-healed, no evidence of infection.    Neurological: He is alert and oriented to person, place, and time.  Skin: Skin is warm and dry.  Psychiatric: He has a normal mood and affect. His behavior is normal. Judgment and thought content normal.     CT scan shows that the greater trochanteric fracture involves the entire greater trochanter, the S-ROM stem is well fixed.  The greater trochanter fracture is actually displaced almost 2 cm proximally and anteriorly.  Assessment/Plan Assess: Displaced greater trochanteric fracture after revision left total hip arthroplasty in September 2018, primary anterior hip was done in the spring of 2018.  Plan: Risks and benefits of open reduction internal fixation were discussed at length with the patient, failure rate with a trochanteric cable plate is about 73-71%.  The patient is very active.  He likes to go bird hunting and he wants to undertake the risk of open reduction internal fixation of the greater trochanter in the hopes that he gains strength and diminishes the pain in his left hip.  We will get this scheduled for him sometime in  the next few weeks.  I will see him back at the time of surgical intervention.  Joanell Rising, PA-C 04/13/2017, 8:17 AM

## 2017-04-15 MED ORDER — TRANEXAMIC ACID 1000 MG/10ML IV SOLN
2000.0000 mg | INTRAVENOUS | Status: AC
  Administered 2017-04-16: 2000 mg via TOPICAL
  Filled 2017-04-15 (×2): qty 20

## 2017-04-15 MED ORDER — TRANEXAMIC ACID 1000 MG/10ML IV SOLN
1000.0000 mg | INTRAVENOUS | Status: AC
  Administered 2017-04-16: 1000 mg via INTRAVENOUS
  Filled 2017-04-15: qty 1100

## 2017-04-15 MED ORDER — DEXTROSE 5 % IV SOLN
3.0000 g | INTRAVENOUS | Status: AC
  Administered 2017-04-16: 3 g via INTRAVENOUS
  Filled 2017-04-15: qty 3000

## 2017-04-15 MED ORDER — BUPIVACAINE LIPOSOME 1.3 % IJ SUSP
20.0000 mL | Freq: Once | INTRAMUSCULAR | Status: AC
  Administered 2017-04-16: 20 mL
  Filled 2017-04-15: qty 20

## 2017-04-15 MED ORDER — LACTATED RINGERS IV SOLN
INTRAVENOUS | Status: DC
  Administered 2017-04-16 (×3): via INTRAVENOUS

## 2017-04-16 ENCOUNTER — Inpatient Hospital Stay (HOSPITAL_COMMUNITY): Payer: Medicare Other | Admitting: Anesthesiology

## 2017-04-16 ENCOUNTER — Encounter (HOSPITAL_COMMUNITY)
Admission: RE | Disposition: A | Discharge status: Home Health | Payer: Self-pay | Source: Ambulatory Visit | Attending: Orthopedic Surgery

## 2017-04-16 ENCOUNTER — Encounter (HOSPITAL_COMMUNITY): Payer: Self-pay | Admitting: *Deleted

## 2017-04-16 ENCOUNTER — Inpatient Hospital Stay (HOSPITAL_COMMUNITY): Payer: Medicare Other

## 2017-04-16 ENCOUNTER — Inpatient Hospital Stay (HOSPITAL_COMMUNITY)
Admission: RE | Admit: 2017-04-16 | Discharge: 2017-04-18 | DRG: 480 | Disposition: A | Discharge status: Home Health | Payer: Medicare Other | Source: Ambulatory Visit | Attending: Orthopedic Surgery | Admitting: Orthopedic Surgery

## 2017-04-16 DIAGNOSIS — Z87442 Personal history of urinary calculi: Secondary | ICD-10-CM

## 2017-04-16 DIAGNOSIS — Z820 Family history of epilepsy and other diseases of the nervous system: Secondary | ICD-10-CM

## 2017-04-16 DIAGNOSIS — Z7982 Long term (current) use of aspirin: Secondary | ICD-10-CM | POA: Diagnosis not present

## 2017-04-16 DIAGNOSIS — S72112A Displaced fracture of greater trochanter of left femur, initial encounter for closed fracture: Secondary | ICD-10-CM

## 2017-04-16 DIAGNOSIS — Z8249 Family history of ischemic heart disease and other diseases of the circulatory system: Secondary | ICD-10-CM

## 2017-04-16 DIAGNOSIS — M9702XA Periprosthetic fracture around internal prosthetic left hip joint, initial encounter: Principal | ICD-10-CM | POA: Diagnosis present

## 2017-04-16 DIAGNOSIS — N401 Enlarged prostate with lower urinary tract symptoms: Secondary | ICD-10-CM | POA: Diagnosis present

## 2017-04-16 DIAGNOSIS — E892 Postprocedural hypoparathyroidism: Secondary | ICD-10-CM | POA: Diagnosis not present

## 2017-04-16 DIAGNOSIS — Z791 Long term (current) use of non-steroidal anti-inflammatories (NSAID): Secondary | ICD-10-CM | POA: Diagnosis not present

## 2017-04-16 DIAGNOSIS — Z86010 Personal history of colonic polyps: Secondary | ICD-10-CM

## 2017-04-16 DIAGNOSIS — R7303 Prediabetes: Secondary | ICD-10-CM | POA: Diagnosis not present

## 2017-04-16 DIAGNOSIS — S72115A Nondisplaced fracture of greater trochanter of left femur, initial encounter for closed fracture: Secondary | ICD-10-CM | POA: Diagnosis not present

## 2017-04-16 DIAGNOSIS — M25552 Pain in left hip: Secondary | ICD-10-CM | POA: Diagnosis present

## 2017-04-16 DIAGNOSIS — Z87891 Personal history of nicotine dependence: Secondary | ICD-10-CM

## 2017-04-16 DIAGNOSIS — S72113A Displaced fracture of greater trochanter of unspecified femur, initial encounter for closed fracture: Secondary | ICD-10-CM | POA: Diagnosis present

## 2017-04-16 DIAGNOSIS — K219 Gastro-esophageal reflux disease without esophagitis: Secondary | ICD-10-CM | POA: Diagnosis present

## 2017-04-16 DIAGNOSIS — Z888 Allergy status to other drugs, medicaments and biological substances status: Secondary | ICD-10-CM

## 2017-04-16 DIAGNOSIS — I10 Essential (primary) hypertension: Secondary | ICD-10-CM | POA: Diagnosis present

## 2017-04-16 DIAGNOSIS — Z8052 Family history of malignant neoplasm of bladder: Secondary | ICD-10-CM | POA: Diagnosis not present

## 2017-04-16 DIAGNOSIS — E785 Hyperlipidemia, unspecified: Secondary | ICD-10-CM | POA: Diagnosis not present

## 2017-04-16 DIAGNOSIS — M1612 Unilateral primary osteoarthritis, left hip: Secondary | ICD-10-CM | POA: Diagnosis present

## 2017-04-16 HISTORY — PX: ORIF HIP FRACTURE: SHX2125

## 2017-04-16 LAB — GLUCOSE, CAPILLARY: Glucose-Capillary: 104 mg/dL — ABNORMAL HIGH (ref 65–99)

## 2017-04-16 LAB — HEMOGLOBIN AND HEMATOCRIT, BLOOD
HCT: 33.8 % — ABNORMAL LOW (ref 39.0–52.0)
Hemoglobin: 10.9 g/dL — ABNORMAL LOW (ref 13.0–17.0)

## 2017-04-16 SURGERY — OPEN REDUCTION INTERNAL FIXATION HIP
Anesthesia: Spinal | Site: Hip | Laterality: Left

## 2017-04-16 MED ORDER — OXYCODONE HCL 5 MG PO TABS
5.0000 mg | ORAL_TABLET | ORAL | Status: DC | PRN
  Administered 2017-04-17 (×2): 5 mg via ORAL
  Filled 2017-04-16 (×2): qty 1

## 2017-04-16 MED ORDER — KCL IN DEXTROSE-NACL 20-5-0.45 MEQ/L-%-% IV SOLN
INTRAVENOUS | Status: DC
  Administered 2017-04-16 – 2017-04-17 (×2): via INTRAVENOUS
  Filled 2017-04-16 (×2): qty 1000

## 2017-04-16 MED ORDER — METOCLOPRAMIDE HCL 5 MG PO TABS: 5.0000 mg | ORAL_TABLET | Freq: Three times a day (TID) | ORAL | Status: DC | PRN

## 2017-04-16 MED ORDER — MIDAZOLAM HCL 5 MG/5ML IJ SOLN
INTRAMUSCULAR | Status: DC | PRN
  Administered 2017-04-16: 2 mg via INTRAVENOUS

## 2017-04-16 MED ORDER — ONDANSETRON HCL 4 MG PO TABS: 4.0000 mg | ORAL_TABLET | Freq: Four times a day (QID) | ORAL | Status: DC | PRN

## 2017-04-16 MED ORDER — DIPHENHYDRAMINE HCL 12.5 MG/5ML PO ELIX: 12.5000 mg | ORAL_SOLUTION | ORAL | Status: DC | PRN

## 2017-04-16 MED ORDER — ACETAMINOPHEN 325 MG PO TABS
650.0000 mg | ORAL_TABLET | ORAL | Status: DC | PRN
  Administered 2017-04-18 (×2): 650 mg via ORAL
  Filled 2017-04-16 (×2): qty 2

## 2017-04-16 MED ORDER — TRANEXAMIC ACID 1000 MG/10ML IV SOLN
1000.0000 mg | Freq: Once | INTRAVENOUS | Status: AC
  Administered 2017-04-16: 1000 mg via INTRAVENOUS
  Filled 2017-04-16: qty 10

## 2017-04-16 MED ORDER — DEXMEDETOMIDINE HCL IN NACL 200 MCG/50ML IV SOLN
INTRAVENOUS | Status: AC
  Filled 2017-04-16: qty 50

## 2017-04-16 MED ORDER — DIPHENHYDRAMINE HCL 50 MG/ML IJ SOLN
INTRAMUSCULAR | Status: DC | PRN
  Administered 2017-04-16: 20 mg via INTRAVENOUS

## 2017-04-16 MED ORDER — CEFUROXIME SODIUM 1.5 G IV SOLR: INTRAVENOUS | Status: DC | PRN

## 2017-04-16 MED ORDER — DEXTROSE 5 % IV SOLN
INTRAVENOUS | Status: DC | PRN
  Administered 2017-04-16: 50 ug/min via INTRAVENOUS

## 2017-04-16 MED ORDER — CEFAZOLIN SODIUM-DEXTROSE 2-4 GM/100ML-% IV SOLN
INTRAVENOUS | Status: AC
  Filled 2017-04-16: qty 100

## 2017-04-16 MED ORDER — BUPIVACAINE-EPINEPHRINE (PF) 0.5% -1:200000 IJ SOLN
INTRAMUSCULAR | Status: AC
  Filled 2017-04-16: qty 30

## 2017-04-16 MED ORDER — PHENOL 1.4 % MT LIQD: 1.0000 | OROMUCOSAL | Status: DC | PRN

## 2017-04-16 MED ORDER — DOCUSATE SODIUM 100 MG PO CAPS
100.0000 mg | ORAL_CAPSULE | Freq: Two times a day (BID) | ORAL | Status: DC
  Administered 2017-04-16 – 2017-04-18 (×4): 100 mg via ORAL
  Filled 2017-04-16 (×4): qty 1

## 2017-04-16 MED ORDER — PROPOFOL 500 MG/50ML IV EMUL
INTRAVENOUS | Status: DC | PRN
  Administered 2017-04-16: 75 ug/kg/min via INTRAVENOUS

## 2017-04-16 MED ORDER — DEXAMETHASONE SODIUM PHOSPHATE 10 MG/ML IJ SOLN
10.0000 mg | Freq: Once | INTRAMUSCULAR | Status: AC
  Administered 2017-04-17: 10 mg via INTRAVENOUS
  Filled 2017-04-16: qty 1

## 2017-04-16 MED ORDER — TAMSULOSIN HCL 0.4 MG PO CAPS
0.4000 mg | ORAL_CAPSULE | Freq: Every day | ORAL | Status: DC
  Administered 2017-04-17 – 2017-04-18 (×2): 0.4 mg via ORAL
  Filled 2017-04-16 (×2): qty 1

## 2017-04-16 MED ORDER — CELECOXIB 200 MG PO CAPS
200.0000 mg | ORAL_CAPSULE | Freq: Two times a day (BID) | ORAL | Status: DC
  Administered 2017-04-16 – 2017-04-18 (×4): 200 mg via ORAL
  Filled 2017-04-16 (×4): qty 1

## 2017-04-16 MED ORDER — FENTANYL CITRATE (PF) 100 MCG/2ML IJ SOLN: 25.0000 ug | INTRAMUSCULAR | Status: DC | PRN

## 2017-04-16 MED ORDER — MIDAZOLAM HCL 2 MG/2ML IJ SOLN
INTRAMUSCULAR | Status: AC
  Filled 2017-04-16: qty 2

## 2017-04-16 MED ORDER — CEFUROXIME SODIUM 1.5 G IV SOLR
INTRAVENOUS | Status: AC
  Filled 2017-04-16: qty 1.5

## 2017-04-16 MED ORDER — PANTOPRAZOLE SODIUM 40 MG PO TBEC
80.0000 mg | DELAYED_RELEASE_TABLET | Freq: Every day | ORAL | Status: DC
  Administered 2017-04-17 – 2017-04-18 (×2): 80 mg via ORAL
  Filled 2017-04-16 (×2): qty 2

## 2017-04-16 MED ORDER — PHENYLEPHRINE HCL 10 MG/ML IJ SOLN
INTRAMUSCULAR | Status: DC | PRN
  Administered 2017-04-16: 120 ug via INTRAVENOUS
  Administered 2017-04-16: 80 ug via INTRAVENOUS
  Administered 2017-04-16: 120 ug via INTRAVENOUS
  Administered 2017-04-16: 80 ug via INTRAVENOUS

## 2017-04-16 MED ORDER — GABAPENTIN 300 MG PO CAPS
300.0000 mg | ORAL_CAPSULE | Freq: Three times a day (TID) | ORAL | Status: DC
  Administered 2017-04-16 – 2017-04-18 (×7): 300 mg via ORAL
  Filled 2017-04-16 (×7): qty 1

## 2017-04-16 MED ORDER — BISACODYL 5 MG PO TBEC: 5.0000 mg | DELAYED_RELEASE_TABLET | Freq: Every day | ORAL | Status: DC | PRN

## 2017-04-16 MED ORDER — METHOCARBAMOL 500 MG PO TABS
500.0000 mg | ORAL_TABLET | Freq: Four times a day (QID) | ORAL | Status: DC | PRN
  Administered 2017-04-16 – 2017-04-18 (×3): 500 mg via ORAL
  Filled 2017-04-16 (×3): qty 1

## 2017-04-16 MED ORDER — ALBUMIN HUMAN 5 % IV SOLN
INTRAVENOUS | Status: DC | PRN
  Administered 2017-04-16 (×2): via INTRAVENOUS

## 2017-04-16 MED ORDER — FENTANYL CITRATE (PF) 250 MCG/5ML IJ SOLN
INTRAMUSCULAR | Status: DC | PRN
  Administered 2017-04-16: 50 ug via INTRAVENOUS
  Administered 2017-04-16: 100 ug via INTRAVENOUS
  Administered 2017-04-16: 50 ug via INTRAVENOUS

## 2017-04-16 MED ORDER — ALUMINUM HYDROXIDE GEL 320 MG/5ML PO SUSP: 15.0000 mL | ORAL | Status: DC | PRN

## 2017-04-16 MED ORDER — FINASTERIDE 5 MG PO TABS
5.0000 mg | ORAL_TABLET | Freq: Every day | ORAL | Status: DC
  Administered 2017-04-17 – 2017-04-18 (×2): 5 mg via ORAL
  Filled 2017-04-16 (×2): qty 1

## 2017-04-16 MED ORDER — OXYCODONE HCL 5 MG PO TABS
10.0000 mg | ORAL_TABLET | ORAL | Status: DC | PRN
  Administered 2017-04-17 (×2): 10 mg via ORAL
  Filled 2017-04-16 (×2): qty 2

## 2017-04-16 MED ORDER — BUPIVACAINE-EPINEPHRINE 0.5% -1:200000 IJ SOLN
INTRAMUSCULAR | Status: DC | PRN
  Administered 2017-04-16: 30 mL

## 2017-04-16 MED ORDER — ONDANSETRON HCL 4 MG/2ML IJ SOLN: 4.0000 mg | Freq: Once | INTRAMUSCULAR | Status: DC | PRN

## 2017-04-16 MED ORDER — TIZANIDINE HCL 2 MG PO TABS: 2.0000 mg | ORAL_TABLET | Freq: Four times a day (QID) | ORAL | 0 refills | Status: DC | PRN

## 2017-04-16 MED ORDER — METOCLOPRAMIDE HCL 5 MG/ML IJ SOLN: 5.0000 mg | Freq: Three times a day (TID) | INTRAMUSCULAR | Status: DC | PRN

## 2017-04-16 MED ORDER — OXYCODONE-ACETAMINOPHEN 5-325 MG PO TABS: 1.0000 | ORAL_TABLET | ORAL | 0 refills | Status: DC | PRN

## 2017-04-16 MED ORDER — FENTANYL CITRATE (PF) 250 MCG/5ML IJ SOLN
INTRAMUSCULAR | Status: AC
  Filled 2017-04-16: qty 5

## 2017-04-16 MED ORDER — ASPIRIN EC 325 MG PO TBEC: 325.0000 mg | DELAYED_RELEASE_TABLET | Freq: Two times a day (BID) | ORAL | 0 refills | Status: DC

## 2017-04-16 MED ORDER — LOSARTAN POTASSIUM 50 MG PO TABS
100.0000 mg | ORAL_TABLET | Freq: Every day | ORAL | Status: DC
  Administered 2017-04-17 – 2017-04-18 (×2): 100 mg via ORAL
  Filled 2017-04-16 (×2): qty 2

## 2017-04-16 MED ORDER — SODIUM CHLORIDE 0.9 % IR SOLN
Status: DC | PRN
  Administered 2017-04-16: 1000 mL

## 2017-04-16 MED ORDER — FLEET ENEMA 7-19 GM/118ML RE ENEM: 1.0000 | ENEMA | Freq: Once | RECTAL | Status: DC | PRN

## 2017-04-16 MED ORDER — ONDANSETRON HCL 4 MG/2ML IJ SOLN: 4.0000 mg | Freq: Four times a day (QID) | INTRAMUSCULAR | Status: DC | PRN

## 2017-04-16 MED ORDER — LACTATED RINGERS IV SOLN
INTRAVENOUS | Status: DC
  Administered 2017-04-16: 11:00:00 via INTRAVENOUS

## 2017-04-16 MED ORDER — MENTHOL 3 MG MT LOZG: 1.0000 | LOZENGE | OROMUCOSAL | Status: DC | PRN

## 2017-04-16 MED ORDER — ACETAMINOPHEN 650 MG RE SUPP: 650.0000 mg | RECTAL | Status: DC | PRN

## 2017-04-16 MED ORDER — METHOCARBAMOL 1000 MG/10ML IJ SOLN
500.0000 mg | Freq: Four times a day (QID) | INTRAMUSCULAR | Status: DC | PRN
  Filled 2017-04-16: qty 5

## 2017-04-16 MED ORDER — BISOPROLOL-HYDROCHLOROTHIAZIDE 5-6.25 MG PO TABS
1.0000 | ORAL_TABLET | Freq: Every day | ORAL | Status: DC
  Administered 2017-04-17 – 2017-04-18 (×2): 1 via ORAL
  Filled 2017-04-16 (×2): qty 1

## 2017-04-16 MED ORDER — POLYETHYLENE GLYCOL 3350 17 G PO PACK: 17.0000 g | PACK | Freq: Every day | ORAL | Status: DC | PRN

## 2017-04-16 MED ORDER — ASPIRIN EC 325 MG PO TBEC
325.0000 mg | DELAYED_RELEASE_TABLET | Freq: Every day | ORAL | Status: DC
  Administered 2017-04-17 – 2017-04-18 (×2): 325 mg via ORAL
  Filled 2017-04-16 (×2): qty 1

## 2017-04-16 MED ORDER — BUPIVACAINE HCL (PF) 0.5 % IJ SOLN
INTRAMUSCULAR | Status: DC | PRN
  Administered 2017-04-16: 2.5 mL via INTRATHECAL

## 2017-04-16 MED ORDER — DEXMEDETOMIDINE HCL 200 MCG/2ML IV SOLN
INTRAVENOUS | Status: DC | PRN
  Administered 2017-04-16 (×3): 8 ug via INTRAVENOUS
  Administered 2017-04-16: 2 ug via INTRAVENOUS
  Administered 2017-04-16 (×4): 8 ug via INTRAVENOUS
  Administered 2017-04-16: 6 ug via INTRAVENOUS

## 2017-04-16 MED ORDER — CHLORHEXIDINE GLUCONATE 4 % EX LIQD: 60.0000 mL | Freq: Once | CUTANEOUS | Status: DC

## 2017-04-16 MED ORDER — EPHEDRINE SULFATE 50 MG/ML IJ SOLN
INTRAMUSCULAR | Status: DC | PRN
  Administered 2017-04-16 (×2): 10 mg via INTRAVENOUS

## 2017-04-16 MED ORDER — PROPOFOL 10 MG/ML IV BOLUS
INTRAVENOUS | Status: AC
Start: 2017-04-16 — End: 2017-04-16
  Filled 2017-04-16: qty 20

## 2017-04-16 MED ORDER — OXYCODONE HCL 5 MG PO TABS: 5.0000 mg | ORAL_TABLET | Freq: Once | ORAL | Status: DC | PRN

## 2017-04-16 MED ORDER — LIDOCAINE HCL (CARDIAC) 20 MG/ML IV SOLN
INTRAVENOUS | Status: DC | PRN
  Administered 2017-04-16: 70 mg via INTRATRACHEAL

## 2017-04-16 MED ORDER — HYDROMORPHONE HCL 1 MG/ML IJ SOLN
0.5000 mg | INTRAMUSCULAR | Status: DC | PRN
  Administered 2017-04-16 (×2): 0.5 mg via INTRAVENOUS
  Filled 2017-04-16 (×2): qty 1

## 2017-04-16 MED ORDER — OXYCODONE HCL 5 MG/5ML PO SOLN: 5.0000 mg | Freq: Once | ORAL | Status: DC | PRN

## 2017-04-16 SURGICAL SUPPLY — 65 items
BLADE SAGITTAL 13X1.27X60 (BLADE) ×1 IMPLANT
BLADE SAW SAG 73X25 THK (BLADE)
BLADE SAW SGTL 18X1.27X75 (BLADE) ×1 IMPLANT
BLADE SAW SGTL 73X25 THK (BLADE) IMPLANT
BLADE SAW SGTL NAR THIN XSHT (BLADE) IMPLANT
BUR SURG 4X8 MED (BURR) IMPLANT
BURR SURG 4X8 MED (BURR)
CABLE EXT 4H GTR W/4 23X232 (Cable) ×1 IMPLANT
CABLE GTR COCR 1.8X635 (Orthopedic Implant) ×3 IMPLANT
COVER BACK TABLE 24X17X13 BIG (DRAPES) IMPLANT
COVER SURGICAL LIGHT HANDLE (MISCELLANEOUS) ×4 IMPLANT
DRAPE HALF SHEET 40X57 (DRAPES) ×2 IMPLANT
DRAPE IMP U-DRAPE 54X76 (DRAPES) ×2 IMPLANT
DRAPE ORTHO SPLIT 77X108 STRL (DRAPES) ×2
DRAPE SURG ORHT 6 SPLT 77X108 (DRAPES) ×1 IMPLANT
DRAPE U-SHAPE 47X51 STRL (DRAPES) ×2 IMPLANT
DRILL BIT 7/64X5 (BIT) ×2 IMPLANT
DRSG AQUACEL AG ADV 3.5X14 (GAUZE/BANDAGES/DRESSINGS) ×1 IMPLANT
DRSG MEPILEX BORDER 4X12 (GAUZE/BANDAGES/DRESSINGS) ×1 IMPLANT
DRSG MEPILEX BORDER 4X8 (GAUZE/BANDAGES/DRESSINGS) IMPLANT
DURAPREP 26ML APPLICATOR (WOUND CARE) ×3 IMPLANT
ELECT BLADE 4.0 EZ CLEAN MEGAD (MISCELLANEOUS)
ELECT REM PT RETURN 9FT ADLT (ELECTROSURGICAL) ×2
ELECTRODE BLDE 4.0 EZ CLN MEGD (MISCELLANEOUS) IMPLANT
ELECTRODE REM PT RTRN 9FT ADLT (ELECTROSURGICAL) ×1 IMPLANT
GAUZE XEROFORM 1X8 LF (GAUZE/BANDAGES/DRESSINGS) ×1 IMPLANT
GLOVE BIO SURGEON STRL SZ7.5 (GLOVE) ×2 IMPLANT
GLOVE BIO SURGEON STRL SZ8.5 (GLOVE) ×2 IMPLANT
GLOVE BIOGEL PI IND STRL 8 (GLOVE) ×1 IMPLANT
GLOVE BIOGEL PI IND STRL 9 (GLOVE) ×1 IMPLANT
GLOVE BIOGEL PI INDICATOR 8 (GLOVE) ×1
GLOVE BIOGEL PI INDICATOR 9 (GLOVE) ×1
GOWN STRL REUS W/ TWL LRG LVL3 (GOWN DISPOSABLE) ×2 IMPLANT
GOWN STRL REUS W/ TWL XL LVL3 (GOWN DISPOSABLE) ×2 IMPLANT
GOWN STRL REUS W/TWL LRG LVL3 (GOWN DISPOSABLE) ×4
GOWN STRL REUS W/TWL XL LVL3 (GOWN DISPOSABLE) ×4
HANDPIECE INTERPULSE COAX TIP (DISPOSABLE)
HOOD PEEL AWAY FACE SHEILD DIS (HOOD) ×4 IMPLANT
KIT BASIN OR (CUSTOM PROCEDURE TRAY) ×2 IMPLANT
KIT ROOM TURNOVER OR (KITS) ×2 IMPLANT
MANIFOLD NEPTUNE II (INSTRUMENTS) ×2 IMPLANT
NEEDLE 22X1 1/2 (OR ONLY) (NEEDLE) ×2 IMPLANT
NS IRRIG 1000ML POUR BTL (IV SOLUTION) ×2 IMPLANT
PACK TOTAL JOINT (CUSTOM PROCEDURE TRAY) ×2 IMPLANT
PACK UNIVERSAL I (CUSTOM PROCEDURE TRAY) ×2 IMPLANT
PAD ARMBOARD 7.5X6 YLW CONV (MISCELLANEOUS) ×4 IMPLANT
PASSER SUT SWANSON 36MM LOOP (INSTRUMENTS) ×2 IMPLANT
SET HNDPC FAN SPRY TIP SCT (DISPOSABLE) IMPLANT
SUT ETHIBOND 2 V 37 (SUTURE) ×2 IMPLANT
SUT ETHILON 3 0 FSL (SUTURE) ×2 IMPLANT
SUT FIBERWIRE #2 38 REV NDL BL (SUTURE)
SUT VIC AB 0 CT1 27 (SUTURE) ×2
SUT VIC AB 0 CT1 27XBRD ANBCTR (SUTURE) ×1 IMPLANT
SUT VIC AB 0 CTB1 27 (SUTURE) ×2 IMPLANT
SUT VIC AB 1 CTX 36 (SUTURE) ×2
SUT VIC AB 1 CTX36XBRD ANBCTR (SUTURE) ×1 IMPLANT
SUT VIC AB 2-0 CTB1 (SUTURE) ×3 IMPLANT
SUT VIC AB 3-0 CT1 27 (SUTURE) ×2
SUT VIC AB 3-0 CT1 TAPERPNT 27 (SUTURE) IMPLANT
SUTURE FIBERWR#2 38 REV NDL BL (SUTURE) IMPLANT
SYR CONTROL 10ML LL (SYRINGE) ×3 IMPLANT
TOWEL OR 17X24 6PK STRL BLUE (TOWEL DISPOSABLE) ×2 IMPLANT
TOWEL OR 17X26 10 PK STRL BLUE (TOWEL DISPOSABLE) ×2 IMPLANT
TRAY FOLEY CATH SILVER 14FR (SET/KITS/TRAYS/PACK) IMPLANT
WATER STERILE IRR 1000ML POUR (IV SOLUTION) ×6 IMPLANT

## 2017-04-16 NOTE — Discharge Instructions (Signed)
INSTRUCTIONS AFTER JOINT REPLACEMENT    o Remove items at home which could result in a fall. This includes throw rugs or furniture in walking pathways o ICE to the affected joint every three hours while awake for 30 minutes at a time, for at least the first 3-5 days, and then as needed for pain and swelling.  Continue to use ice for pain and swelling. You may notice swelling that will progress down to the foot and ankle.  This is normal after surgery.  Elevate your leg when you are not up walking on it.   o Continue to use the breathing machine you got in the hospital (incentive spirometer) which will help keep your temperature down.  It is common for your temperature to cycle up and down following surgery, especially at night when you are not up moving around and exerting yourself.  The breathing machine keeps your lungs expanded and your temperature down.   DIET:  As you were doing prior to hospitalization, we recommend a well-balanced diet.  DRESSING / WOUND CARE / SHOWERING  Keep the surgical dressing until follow up.  The dressing is water proof, so you can shower without any extra covering.  IF THE DRESSING FALLS OFF or the wound gets wet inside, change the dressing with sterile gauze.  Please use good hand washing techniques before changing the dressing.  Do not use any lotions or creams on the incision until instructed by your surgeon.    ACTIVITY  o Increase activity slowly as tolerated, but follow the weight bearing instructions below.   o No driving for 6 weeks or until further direction given by your physician.  You cannot drive while taking narcotics.  o No lifting or carrying greater than 10 lbs. until further directed by your surgeon. o Avoid periods of inactivity such as sitting longer than an hour when not asleep. This helps prevent blood clots.  o You may return to work once you are authorized by your doctor.     WEIGHT BEARING   Partial weight bearing with assist device as  directed.  50%   EXERCISES  Results after joint replacement surgery are often greatly improved when you follow the exercise, range of motion and muscle strengthening exercises prescribed by your doctor. Safety measures are also important to protect the joint from further injury. Any time any of these exercises cause you to have increased pain or swelling, decrease what you are doing until you are comfortable again and then slowly increase them. If you have problems or questions, call your caregiver or physical therapist for advice.   Rehabilitation is important following a joint replacement. After just a few days of immobilization, the muscles of the leg can become weakened and shrink (atrophy).  These exercises are designed to build up the tone and strength of the thigh and leg muscles and to improve motion. Often times heat used for twenty to thirty minutes before working out will loosen up your tissues and help with improving the range of motion but do not use heat for the first two weeks following surgery (sometimes heat can increase post-operative swelling).   These exercises can be done on a training (exercise) mat, on the floor, on a table or on a bed. Use whatever works the best and is most comfortable for you.    Use music or television while you are exercising so that the exercises are a pleasant break in your day. This will make your life better with the  exercises acting as a break in your routine that you can look forward to.   Perform all exercises about fifteen times, three times per day or as directed.  You should exercise both the operative leg and the other leg as well.  Exercises include:    Quad Sets - Tighten up the muscle on the front of the thigh (Quad) and hold for 5-10 seconds.    Straight Leg Raises - With your knee straight (if you were given a brace, keep it on), lift the leg to 60 degrees, hold for 3 seconds, and slowly lower the leg.  Perform this exercise against  resistance later as your leg gets stronger.   Leg Slides: Lying on your back, slowly slide your foot toward your buttocks, bending your knee up off the floor (only go as far as is comfortable). Then slowly slide your foot back down until your leg is flat on the floor again.   Angel Wings: Lying on your back spread your legs to the side as far apart as you can without causing discomfort.   Hamstring Strength:  Lying on your back, push your heel against the floor with your leg straight by tightening up the muscles of your buttocks.  Repeat, but this time bend your knee to a comfortable angle, and push your heel against the floor.  You may put a pillow under the heel to make it more comfortable if necessary.   A rehabilitation program following joint replacement surgery can speed recovery and prevent re-injury in the future due to weakened muscles. Contact your doctor or a physical therapist for more information on knee rehabilitation.    CONSTIPATION  Constipation is defined medically as fewer than three stools per week and severe constipation as less than one stool per week.  Even if you have a regular bowel pattern at home, your normal regimen is likely to be disrupted due to multiple reasons following surgery.  Combination of anesthesia, postoperative narcotics, change in appetite and fluid intake all can affect your bowels.   YOU MUST use at least one of the following options; they are listed in order of increasing strength to get the job done.  They are all available over the counter, and you may need to use some, POSSIBLY even all of these options:    Drink plenty of fluids (prune juice may be helpful) and high fiber foods Colace 100 mg by mouth twice a day  Senokot for constipation as directed and as needed Dulcolax (bisacodyl), take with full glass of water  Miralax (polyethylene glycol) once or twice a day as needed.  If you have tried all these things and are unable to have a bowel  movement in the first 3-4 days after surgery call either your surgeon or your primary doctor.    If you experience loose stools or diarrhea, hold the medications until you stool forms back up.  If your symptoms do not get better within 1 week or if they get worse, check with your doctor.  If you experience "the worst abdominal pain ever" or develop nausea or vomiting, please contact the office immediately for further recommendations for treatment.   ITCHING:  If you experience itching with your medications, try taking only a single pain pill, or even half a pain pill at a time.  You can also use Benadryl over the counter for itching or also to help with sleep.   TED HOSE STOCKINGS:  Use stockings on both legs until for at  least 2 weeks or as directed by physician office. They may be removed at night for sleeping.  MEDICATIONS:  See your medication summary on the After Visit Summary that nursing will review with you.  You may have some home medications which will be placed on hold until you complete the course of blood thinner medication.  It is important for you to complete the blood thinner medication as prescribed.  PRECAUTIONS:  If you experience chest pain or shortness of breath - call 911 immediately for transfer to the hospital emergency department.   If you develop a fever greater that 101 F, purulent drainage from wound, increased redness or drainage from wound, foul odor from the wound/dressing, or calf pain - CONTACT YOUR SURGEON.                                                   FOLLOW-UP APPOINTMENTS:  If you do not already have a post-op appointment, please call the office for an appointment to be seen by your surgeon.  Guidelines for how soon to be seen are listed in your After Visit Summary, but are typically between 1-4 weeks after surgery.  OTHER INSTRUCTIONS:   Knee Replacement:  Do not place pillow under knee, focus on keeping the knee straight while resting. CPM  instructions: 0-90 degrees, 2 hours in the morning, 2 hours in the afternoon, and 2 hours in the evening. Place foam block, curve side up under heel at all times except when in CPM or when walking.  DO NOT modify, tear, cut, or change the foam block in any way.  MAKE SURE YOU:   Understand these instructions.   Get help right away if you are not doing well or get worse.    Thank you for letting us be a part of your medical care team.  It is a privilege we respect greatly.  We hope these instructions will help you stay on track for a fast and full recovery!

## 2017-04-16 NOTE — Transfer of Care (Signed)
Immediate Anesthesia Transfer of Care Note  Patient: [name] [name]  Procedure(s) Performed: OPEN REDUCTION INTERNAL FIXATION HIP GREATER TROCHANTER (Left Hip)  Patient Location: PACU  Anesthesia Type:Spinal  Level of Consciousness: awake, alert , oriented and patient cooperative  Airway & Oxygen Therapy: Patient Spontanous Breathing and Patient connected to nasal cannula oxygen  Post-op Assessment: Report given to RN and Post -op Vital signs reviewed and stable  Post vital signs: Reviewed and stable  Last Vitals:  Vitals:   04/16/17 1106  BP: (!) 145/81  Pulse: 62  Resp: 20  Temp: 37 C  SpO2: 98%    Last Pain:  Vitals:   04/16/17 1106  TempSrc: Oral      Patients Stated Pain Goal: 3 (12/07/10 1975)  Complications: No apparent anesthesia complications

## 2017-04-16 NOTE — Anesthesia Preprocedure Evaluation (Addendum)
Anesthesia Evaluation  Patient identified by MRN, date of birth, ID band Patient awake    Reviewed: Allergy & Precautions, NPO status , Patient's Chart, lab work & pertinent test results, reviewed documented beta blocker date and time   Airway Mallampati: III  TM Distance: >3 FB Neck ROM: Full    Dental  (+) Dental Advisory Given, Teeth Intact   Pulmonary former smoker,    Pulmonary exam normal breath sounds clear to auscultation       Cardiovascular hypertension, Pt. on home beta blockers and Pt. on medications Normal cardiovascular exam Rhythm:Regular Rate:Normal     Neuro/Psych negative neurological ROS  negative psych ROS   GI/Hepatic Neg liver ROS, GERD  Medicated and Controlled,Esophageal stricture s/p dilatation   Endo/Other  Obesity  Renal/GU Renal InsufficiencyRenal disease     Musculoskeletal  (+) Arthritis , Osteoarthritis,    Abdominal   Peds  Hematology negative hematology ROS (+) Plt 343k   Anesthesia Other Findings   Reproductive/Obstetrics                            Anesthesia Physical Anesthesia Plan  ASA: II  Anesthesia Plan: Spinal   Post-op Pain Management:    Induction:   PONV Risk Score and Plan: Treatment may vary due to age or medical condition  Airway Management Planned: Natural Airway and Nasal Cannula  Additional Equipment: None  Intra-op Plan:   Post-operative Plan:   Informed Consent: I have reviewed the patients History and Physical, chart, labs and discussed the procedure including the risks, benefits and alternatives for the proposed anesthesia with the patient or authorized representative who has indicated his/her understanding and acceptance.   Dental advisory given  Plan Discussed with: CRNA  Anesthesia Plan Comments:         Anesthesia Quick Evaluation

## 2017-04-16 NOTE — Interval H&P Note (Signed)
History and Physical Interval Note:  04/16/2017 12:40 PM  [name] [name]  has presented today for surgery, with the diagnosis of LEFT HIP GREATER TROCHANTER FRACTURE  The various methods of treatment have been discussed with the patient and family. After consideration of risks, benefits and other options for treatment, the patient has consented to  Procedure(s): OPEN REDUCTION INTERNAL FIXATION HIP GREATER TROCHANTER (Left) as a surgical intervention .  The patient's history has been reviewed, patient examined, no change in status, stable for surgery.  I have reviewed the patient's chart and labs.  Questions were answered to the patient's satisfaction.     Kerin Salen

## 2017-04-16 NOTE — Anesthesia Procedure Notes (Signed)
Spinal  Patient location during procedure: OR Start time: 04/16/2017 1:11 PM End time: 04/16/2017 1:15 PM Staffing Anesthesiologist: Audry Pili, MD Performed: anesthesiologist  Preanesthetic Checklist Completed: patient identified, surgical consent, pre-op evaluation, timeout performed, IV checked, risks and benefits discussed and monitors and equipment checked Spinal Block Patient position: sitting Prep: DuraPrep Patient monitoring: heart rate, cardiac monitor, continuous pulse ox and blood pressure Approach: midline Location: L3-4 Injection technique: single-shot Needle Needle type: Pencan  Needle gauge: 24 G Additional Notes Functioning IV was confirmed and monitors were applied. Sterile prep and drape, including hand hygiene, mask, and sterile gloves were used. The patient was positioned and the spine was prepped. The skin was anesthetized with lidocaine. Free flow of clear CSF was obtained prior to injecting local anesthetic into the CSF. The spinal needle aspirated freely following injection. The needle was carefully withdrawn. The patient tolerated the procedure well. Consent was obtained prior to the procedure with all questions answered and concerns addressed. Risks including, but not limited to, bleeding, infection, nerve damage, paralysis, failed block, inadequate analgesia, allergic reaction, high spinal, itching, and headache were discussed and the patient wished to proceed.  Renold Don, MD

## 2017-04-16 NOTE — Op Note (Signed)
PATIENT ID:      KYDAN SHANHOLTZER  MRN:     [id number] DOB/AGE:    September 20, 1940 / 77 y.o.       OPERATIVE REPORT    DATE OF PROCEDURE:  04/16/2017       PREOPERATIVE DIAGNOSIS:  LEFT HIP GREATER TROCHANTER FRACTURE                                                       Estimated body mass index is 36.68 kg/m as calculated from the following:   Height as of 04/12/17: 5\' 11"  (1.803 m).   Weight as of this encounter: 263 lb (119.3 kg).     POSTOPERATIVE DIAGNOSIS:  LEFT HIP GREATER TROCHANTER FRACTURE, that extended into the subtrochanteric region and was a periprosthetic fracture with a stable stem.                                                          PROCEDURE: Open reduction internal fixation of revision left total hip periprosthetic fracture using a 5 hole Zimmer/Biomet greater trochanteric plate with all cable holes filled.  Again the stem which was a long curved S-ROM stem was intact.   Surgeon: Kerin Salen MD  First assistant: Leighton Parody PA-C  Fluid replacement: 2000 cc crystalloid  Estimated blood loss: 900 cc  Tranexamic acid: 1 g IV and 2 g topical  Preoperative antibiotics: 3 g Ancef  Complications: None   INDICATIONS FOR PROCEDURE: Patient has a left total hip arthroplasty with another physician in summer 2018.  This was a noncolored Karaya stem went on to subside with approximately a centimeter and a half of shortening.  Patient then underwent removal of the stem, again by another physician that required an extended trochanteric osteotomy that was fixed with 3 2 mm cables.  At the patient's first postoperative visit there was an incident and the patient sustained a greater trochanteric fracture that went on to displace about 2 cm superiorly and rotated anteriorly.  The patient has had weakness and discomfort since then.  Desires open reduction internal fixation of the greater trochanteric fracture that includes a large area of the anterior proximal femur based on the CT  scan done preoperatively.  The S-ROM sleeve appears to be ingrown and therefore the stem itself is intact.  Risks and benefits of surgery been discussed and all questions answered.  Description of procedure: The patient was identified by armband and taken operating room [facility] at [facility].  The appropriate anesthetic monitors were attached and spinal anesthesia was induced.  On a flat Jackson table the the left lower extremity was prepped and draped in the usual sterile fashion from the ankle to the hemipelvis.  Patient was placed in the right lateral decubitus position and fixed there was a Stulberg marked to pelvic clamp.  The left lower extremity was then prepped and draped in usual sterile fashion from the ankle to the hemipelvis.  The old posterior lateral incision was re-created and extended distally for another 10 cm for a total length of 30 cm.  Small bleeders in the skin and subcutaneous tissue identified and  cauterized.  The IT band was cut in line with the skin incision exposing scar tissue and the greater trochanter as well as the nonunion site.  We did get a peek at the hip joint itself and the joint fluid was clear with no evidence of infection.  It was also evident that distally the extended trochanteric osteotomy was not completely healed but the shaft of the femur was protected by the extra long S-ROM stem.  The previously used Dall-Miles cables were removed distally.  The most proximal cable was kept intact as it did involve the base of the S-ROM sleeve.  We carefully dissected scar tissue off the lateral shaft of the femur going distally for about 15-20 cm below the fracture site.  The vastus lateralis was elevated from posterior to anterior off of the flare of the greater trochanter and the greater trochanter itself was elevated off of the S-ROM sleeve allowing [place] to remove scar tissue and fibrous tissue that had ingrown.  Bleeders were identified and cauterized.  Once we removed all the  scar tissue and mobilized the greater trochanter where able to reduce it back down to its bed we selected a 5 hole Zimmer greater trochanteric cable plate and hammered it into the proximal end of the greater trochanter and then using cables sequentially locked to the plate down to the femur below the lesser trochanter.  We then placed a couple of intertrochanteric cables to further get fixation on the greater trochanter.  All cable holes were filled.  Cables were all tensioned and cut and at the end of the construct excellent stability was obtained with good reduction.  The hip was taken through range of motion to confirm there was level loosening.  The wound was thoroughly irrigated out with normal saline dilution.  Small bleeders again identified and cauterized.  The vastus lateralis was then repaired using running 0 Vicryl suture the IT band with running #1 Vicryl suture the subcutaneous tissue with running 2 oh and subcuticular 3-0 Vicryl suture.  A long Aquacil dressing was applied.  Patient was unclamped rolled supine awaken and taken to the recovery room without difficulty. Kerin Salen 04/16/2017, 3:49 PM

## 2017-04-16 NOTE — Progress Notes (Signed)
Orthopedic Tech Progress Note Patient Details:  [name] [name] 1940/05/25 [id number]  Ortho Devices Ortho Device/Splint Location: Overhead frame with trapeze applied Ortho Device/Splint Interventions: Application   Post Interventions Patient Tolerated: Well Instructions Provided: Care of device   [name] [name] 04/16/2017, 10:34 PM

## 2017-04-17 LAB — BASIC METABOLIC PANEL
Anion gap: 12 (ref 5–15)
BUN: 15 mg/dL (ref 6–20)
CO2: 22 mmol/L (ref 22–32)
Calcium: 8.8 mg/dL — ABNORMAL LOW (ref 8.9–10.3)
Chloride: 101 mmol/L (ref 101–111)
Creatinine, Ser: 1.36 mg/dL — ABNORMAL HIGH (ref 0.61–1.24)
GFR calc Af Amer: 58 mL/min — ABNORMAL LOW (ref >60)
GFR calc non Af Amer: 50 mL/min — ABNORMAL LOW (ref >60)
Glucose, Bld: 143 mg/dL — ABNORMAL HIGH (ref 65–99)
Potassium: 3.8 mmol/L (ref 3.5–5.1)
Sodium: 135 mmol/L (ref 135–145)

## 2017-04-17 LAB — CBC
HCT: 35.5 % — ABNORMAL LOW (ref 39.0–52.0)
Hemoglobin: 11.4 g/dL — ABNORMAL LOW (ref 13.0–17.0)
MCH: 28.5 pg (ref 26.0–34.0)
MCHC: 32.1 g/dL (ref 30.0–36.0)
MCV: 88.8 fL (ref 78.0–100.0)
Platelets: 248 10*3/uL (ref 150–400)
RBC: 4 MIL/uL — ABNORMAL LOW (ref 4.22–5.81)
RDW: 14.7 % (ref 11.5–15.5)
WBC: 11 10*3/uL — ABNORMAL HIGH (ref 4.0–10.5)

## 2017-04-17 NOTE — Progress Notes (Signed)
Patient is due to void after surgery but unable to urinate.  Bladder scan resulted 540 ml. Patient insisted to self catherize.. Output 444ml.

## 2017-04-17 NOTE — Care Management Note (Signed)
Case Management Note  Patient Details  Name: AIDRIC ENDICOTT MRN: [id number] Date of Birth: 1940-11-22  Subjective/Objective:  77 yr old gentleman s/p ORIF of the left hip.                 Action/Plan: Case manager spoke with patient and his brother concerning discharge plan and DME. Patient was preoperatively setup with [facility], no changes. Case manager also provided information on private pay companies for further needs patient expressed. He has all necessary DME. Will have family support at discharge.     Expected Discharge Date:    04/18/17              Expected Discharge Plan:  [place]  In-House Referral:     Discharge planning Services  CM Consult  Post Acute Care Choice:  Home Health Choice offered to:  Sibling, Patient  DME Arranged:  N/A(has RW and 3in1) DME Agency:  NA  HH Arranged:  PT, Nurse's Aide [place] Agency:  [facility] (formerly [employer])  Status of Service:  Completed, signed off  If discussed at [employer], dates discussed:    Additional Comments:  Ninfa Meeker, RN 04/17/2017, 2:02 PM

## 2017-04-17 NOTE — Progress Notes (Signed)
Left hip incision with Aquacel dressing dry and intact. Pt is ambulating to the toilet with rolling walker, he understands the partial weight bearing status. Unable to urinate, pt does self catheterization which he does regularly at home.

## 2017-04-17 NOTE — Progress Notes (Signed)
  PATIENT ID: [name] [name]  MRN: [id number]  DOB/AGE:  Apr 17, 1940 / 77 y.o.  1 Day Post-Op Procedure(s) (LRB): OPEN REDUCTION INTERNAL FIXATION HIP GREATER TROCHANTER (Left)  Subjective: Sitting up, doing well, tol PO, needed to self-cath this AM due to scan approx 500.  Got 400 Having some pain, but moderate   Objective: Hgb 11.4 Dressing c/d/i NVI Calf soft  Assessment/Plan: Doing well for POD1.  Will continue to monitor voiding issue, and begin to mobilize today.  Poss. D/C home tomorrow  Shanon Brow [address] [place], [place]  [id number] Office: [phone] Mobile: [phone]  04/17/2017, 8:00 AM

## 2017-04-17 NOTE — Evaluation (Signed)
Physical Therapy Evaluation Patient Details Name: [name] [name] MRN: [id number] DOB: 1940/06/23 Today's Date: 04/17/2017   History of Present Illness  77 y.o. male s/p L Hip ORIF 2/15. PMH includes: HTN, Hyperlipidemia, L THA with revision.  Clinical Impression  Patient is s/p above surgery resulting in functional limitations due to the deficits listed below (see PT Problem List). PTA pt living with wife ambulating independently in multilevel home with stairs to enter. Upon eval pt presents with post op pain and weakness that limit his mobility. Able to ambulate down hallway with RW and min guard to close supervision this visit. Plan to focus on stairs tomorrow next visit.  Patient will benefit from skilled PT to increase their independence and safety with mobility to allow discharge to the venue listed below.      Follow Up Recommendations Home health PT;Supervision for mobility/OOB    Equipment Recommendations       Recommendations for Other Services       Precautions / Restrictions Precautions Precautions: Posterior Hip Precaution Booklet Issued: Yes (comment) Precaution Comments: reviewed 3/3 hip precautions + PWB status Restrictions Weight Bearing Restrictions: Yes LLE Weight Bearing: Partial weight bearing LLE Partial Weight Bearing Percentage or Pounds: 50      Mobility  Bed Mobility               General bed mobility comments: OOB at entry  Transfers Overall transfer level: Needs assistance Equipment used: Rolling walker (2 wheeled) Transfers: Sit to/from Stand Sit to Stand: Min assist;Min guard         General transfer comment: Min guard for safety cues for hand placement and hip precautions.   Ambulation/Gait Ambulation/Gait assistance: Min guard Ambulation Distance (Feet): 150 Feet Assistive device: Rolling walker (2 wheeled) Gait Pattern/deviations: Step-to pattern Gait velocity: decreased   General Gait Details: cues for step length heel strike,  patient with intermittent step through gait, antalgic.   Stairs            Wheelchair Mobility    Modified Rankin (Stroke Patients Only)       Balance Overall balance assessment: Needs assistance   Sitting balance-Leahy Scale: Good       Standing balance-Leahy Scale: Fair Standing balance comment: RW for dynamic mobility                             Pertinent Vitals/Pain Pain Assessment: Faces Faces Pain Scale: Hurts even more Pain Location: L Hip Pain Descriptors / Indicators: Aching;Discomfort;Contraction Pain Intervention(s): Limited activity within patient's tolerance;Monitored during session;Premedicated before session;Repositioned    Home Living Family/patient expects to be discharged to:: Private residence Living Arrangements: Spouse/significant other Available Help at Discharge: Family Type of Home: House Home Access: Stairs to enter Entrance Stairs-Rails: Right Entrance Stairs-Number of Steps: 4 Home Layout: Multi-level Home Equipment: Environmental consultant - 2 wheels;Cane - single point;Shower seat      Prior Function Level of Independence: Independent         Comments: wife is unsteady and uses cane     Hand Dominance        Extremity/Trunk Assessment   Upper Extremity Assessment Upper Extremity Assessment: Overall WFL for tasks assessed    Lower Extremity Assessment Lower Extremity Assessment: (RLE strength 4/5  LLE same but hip flexion 3-/5 post op pain)       Communication   Communication: No difficulties  Cognition Arousal/Alertness: Awake/alert Behavior During Therapy: WFL for tasks assessed/performed Overall Cognitive  Status: Within Functional Limits for tasks assessed                                        General Comments      Exercises Total Joint Exercises Ankle Circles/Pumps: 20 reps Quad Sets: 10 reps Hip ABduction/ADduction: 10 reps   Assessment/Plan    PT Assessment Patient needs continued PT  services  PT Problem List Decreased strength;Decreased range of motion;Decreased balance;Decreased activity tolerance;Decreased mobility;Pain       PT Treatment Interventions DME instruction;Gait training;Stair training;Functional mobility training;Therapeutic activities;Therapeutic exercise    PT Goals (Current goals can be found in the Care Plan section)  Acute Rehab PT Goals Patient Stated Goal: return home PT Goal Formulation: With patient Time For Goal Achievement: 04/24/17 Potential to Achieve Goals: Good    Frequency Min 5X/week   Barriers to discharge Decreased caregiver support Wife with poor balance inability to help    Co-evaluation               AM-PAC PT "6 Clicks" Daily Activity  Outcome Measure Difficulty turning over in bed (including adjusting bedclothes, sheets and blankets)?: A Little Difficulty moving from lying on back to sitting on the side of the bed? : A Little Difficulty sitting down on and standing up from a chair with arms (e.g., wheelchair, bedside commode, etc,.)?: A Little Help needed moving to and from a bed to chair (including a wheelchair)?: A Little Help needed walking in hospital room?: A Little Help needed climbing 3-5 steps with a railing? : A Little 6 Click Score: 18    End of Session Equipment Utilized During Treatment: Gait belt Activity Tolerance: Patient tolerated treatment well Patient left: in chair;with call bell/phone within reach Nurse Communication: Mobility status PT Visit Diagnosis: Unsteadiness on feet (R26.81);Other abnormalities of gait and mobility (R26.89);Muscle weakness (generalized) (M62.81);Other (comment)    Time: 8032-1224 PT Time Calculation (min) (ACUTE ONLY): 38 min   Charges:   PT Evaluation $PT Eval Low Complexity: 1 Low PT Treatments $Gait Training: 8-22 mins   PT G Codes:       Reinaldo Berber, PT, DPT Acute Rehab Services Pager: [phone]    Reinaldo Berber 04/17/2017, 4:15 PM

## 2017-04-18 LAB — CBC
HCT: 30.4 % — ABNORMAL LOW (ref 39.0–52.0)
Hemoglobin: 9.9 g/dL — ABNORMAL LOW (ref 13.0–17.0)
MCH: 28.4 pg (ref 26.0–34.0)
MCHC: 32.6 g/dL (ref 30.0–36.0)
MCV: 87.1 fL (ref 78.0–100.0)
Platelets: 237 10*3/uL (ref 150–400)
RBC: 3.49 MIL/uL — ABNORMAL LOW (ref 4.22–5.81)
RDW: 14.9 % (ref 11.5–15.5)
WBC: 18.6 10*3/uL — ABNORMAL HIGH (ref 4.0–10.5)

## 2017-04-18 NOTE — Discharge Summary (Signed)
Physician Discharge Summary  Patient ID: [name] [name] MRN: [id number] DOB/AGE: 1940/10/12 77 y.o.  Admit date: 04/16/2017 Discharge date: 04/18/2017  Admission Diagnoses:  Greater trochanter fracture [facility])  Discharge Diagnoses:  Principal Problem:   Greater trochanter fracture [facility]) Active Problems:   Primary osteoarthritis of left hip   Past Medical History:  Diagnosis Date  . Arthritis    in left hip  . ED (erectile dysfunction)   . Enlarged prostate with lower urinary tract symptoms (LUTS)   . GERD (gastroesophageal reflux disease)   . History of chronic gastritis   . History of colon polyps    hyperplastic 2006  . History of esophageal stricture    S/P  DILATATION 2009; 2010; 2010 2012  . History of kidney stones    hx. multiple kidney stones  . History of kidney stones   . History of primary hyperparathyroidism    s/p  left superior parathyroidectomy 07-10-2014  . Hyperlipidemia   . Hypertension   . Left ureteral stone   . Nephrolithiasis    left side   . Pre-diabetes     Surgeries: Procedure(s): OPEN REDUCTION INTERNAL FIXATION HIP GREATER TROCHANTER on 04/16/2017   Consultants (if any):   Discharged Condition: Improved  Hospital Course: [name] [name] is an 77 y.o. male who was admitted 04/16/2017 with a diagnosis of Greater trochanter fracture ([place]) and went to the operating room on 04/16/2017 and underwent the above named procedures.    He was given perioperative antibiotics:  Anti-infectives (From admission, onward)   Start     Dose/Rate Route Frequency Ordered Stop   04/16/17 1404  cefUROXime (ZINACEF) injection  Status:  Discontinued       As needed 04/16/17 1404 04/16/17 1624   04/16/17 1130  ceFAZolin (ANCEF) 3 g in dextrose 5 % 50 mL IVPB     3 g 130 mL/hr over 30 Minutes Intravenous To ShortStay Surgical 04/15/17 1127 04/16/17 1306   04/16/17 1110  ceFAZolin (ANCEF) 2-4 GM/100ML-% IVPB  Status:  Discontinued    Comments:  Nyoka Cowden   :  cabinet override      04/16/17 1110 04/16/17 1112    .  He was given sequential compression devices, early ambulation, and ASA for DVT prophylaxis.  He benefited maximally from the hospital stay and there were no complications.  At the time of his d/c home, he was tolerating PO diet, Afeb, dressing c/d/i, NVI, and had passed PT for d/c home.    Recent vital signs:  Vitals:   04/17/17 1939 04/18/17 0522  BP: 115/67 108/67  Pulse: 83 65  Resp: 17 17  Temp: 98.1 F (36.7 C) 97.7 F (36.5 C)  SpO2: 98% 98%    Recent laboratory studies:  Lab Results  Component Value Date   HGB 9.9 (L) 04/18/2017   HGB 11.4 (L) 04/17/2017   HGB 10.9 (L) 04/16/2017   Lab Results  Component Value Date   WBC 18.6 (H) 04/18/2017   PLT 237 04/18/2017   Lab Results  Component Value Date   INR 1.01 04/12/2017   Lab Results  Component Value Date   NA 135 04/17/2017   K 3.8 04/17/2017   CL 101 04/17/2017   CO2 22 04/17/2017   BUN 15 04/17/2017   CREATININE 1.36 (H) 04/17/2017   GLUCOSE 143 (H) 04/17/2017    Discharge Medications:   Allergies as of 04/18/2017      Reactions   Iohexol Shortness Of Breath   Ace Inhibitors Cough  Medication List    STOP taking these medications   acetaminophen 325 MG tablet Commonly known as:  TYLENOL   meloxicam 15 MG tablet Commonly known as:  MOBIC   traMADol 50 MG tablet Commonly known as:  ULTRAM     TAKE these medications   aspirin EC 325 MG tablet Take 1 tablet (325 mg total) by mouth 2 (two) times daily. What changed:    medication strength  how much to take  when to take this   b complex vitamins tablet Take 1 tablet by mouth daily.   bisoprolol-hydrochlorothiazide 5-6.25 MG tablet Commonly known as:  ZIAC TAKE 1 TABLET BY MOUTH EVERY MORNING FOR BLOOD PRESSURE   fenofibrate 145 MG tablet Commonly known as:  TRICOR TAKE 1 TABLET BY MOUTH DAILY FOR TRIGLYCERIDES/BLOOD FATS   finasteride 5 MG tablet Commonly known as:   PROSCAR Take 5 mg by mouth daily.   Fish Oil 1200 MG Caps Take 1 capsule by mouth daily.   FLAX SEEDS PO Take 1,000 mg by mouth daily.   gabapentin 300 MG capsule Commonly known as:  NEURONTIN Take 300 mg by mouth 2 (two) times daily.   losartan 100 MG tablet Commonly known as:  COZAAR TAKE 1 TABLET BY MOUTH DAILY FOR BLOOD PRESSURE.   Magnesium Oxide 250 MG Tabs Take 250 mg by mouth daily.   omeprazole 40 MG capsule Commonly known as:  PRILOSEC Take 40 mg by mouth daily.   oxyCODONE-acetaminophen 5-325 MG tablet Commonly known as:  PERCOCET/ROXICET Take 1 tablet by mouth every 4 (four) hours as needed for severe pain.   tamsulosin 0.4 MG Caps capsule Commonly known as:  FLOMAX Take 0.4 mg by mouth daily after breakfast.   tiZANidine 2 MG tablet Commonly known as:  ZANAFLEX Take 1 tablet (2 mg total) by mouth every 6 (six) hours as needed for muscle spasms.   VITAMIN D PO Take 4,000-6,000 Units by mouth See admin instructions. 6000 iu in the morning, and 4000 iu in the evening            Durable Medical Equipment  (From admission, onward)        Start     Ordered   04/16/17 1750  DME Walker rolling  Once    Question:  Patient needs a walker to treat with the following condition  Answer:  Status post total hip replacement, left   04/16/17 1749   04/16/17 1750  DME 3 n 1  Once     04/16/17 1749   04/16/17 1750  DME Bedside commode  Once    Question:  Patient needs a bedside commode to treat with the following condition  Answer:  Status post total hip replacement, left   04/16/17 1749       Discharge Care Instructions  (From admission, onward)        Start     Ordered   04/16/17 0000  Partial weight bearing    Question Answer Comment  % Body Weight 50%   Laterality left      04/16/17 1631      Diagnostic Studies: Dg Chest 2 View  Result Date: 04/12/2017 CLINICAL DATA:  Preop. EXAM: CHEST  2 VIEW COMPARISON:  01/19/2014 FINDINGS: Lungs are  adequately inflated without consolidation or effusion. Cardiomediastinal silhouette and remainder of the exam is unchanged. IMPRESSION: No active cardiopulmonary disease. Electronically Signed   By: Marin Olp M.D.   On: 04/12/2017 11:57   Ct Hip Left Wo Contrast  Result Date: 03/23/2017 CLINICAL DATA:  Status post left hip fracture.  Continued pain. EXAM: CT OF THE LEFT HIP WITHOUT CONTRAST TECHNIQUE: Multidetector CT imaging of the left hip was performed according to the standard protocol. Multiplanar CT image reconstructions were also generated. COMPARISON:  None. FINDINGS: Bones/Joint/Cartilage Left total hip arthroplasty with beam hardening artifact partially obscuring the adjacent soft tissue and osseous structures. No osteolysis or periarticular fluid collection. Ununited fracture of the proximal left femoral diaphysis around the proximal femoral stem component transfixed with 3 cerclage wires with mild heterotopic bone formation. Chronically fractured greater trochanter which is discontinuous with the remainder of left proximal femur. No other fracture or dislocation. Normal alignment. No joint effusion. Ligaments Ligaments are suboptimally evaluated by CT. Muscles and Tendons Mild left gluteus medius and gluteus minimus atrophy. No intramuscular fluid collection. Soft tissue No fluid collection or hematoma.  No soft tissue mass. IMPRESSION: 1. Left total hip arthroplasty with beam hardening artifact partially obscuring the adjacent soft tissue and osseous structures. No osteolysis or periarticular fluid collection. Ununited fracture of the proximal left femoral diaphysis around the proximal femoral stem component transfixed with 3 cerclage wires with mild heterotopic bone formation. Chronically fractured greater trochanter which is discontinuous with the remainder of left proximal femur. Mild left gluteus medius and gluteus minimus atrophy. Electronically Signed   By: Kathreen Devoid   On: 03/23/2017  13:20   Dg Hip Unilat With Pelvis 2-3 Views Left  Result Date: 04/16/2017 CLINICAL DATA:  Status post ORIF of left greater trochanter fracture. EXAM: DG HIP (WITH OR WITHOUT PELVIS) 2-3V LEFT COMPARISON:  Left hip CT 03/23/2017 and radiographs 11/18/2016 FINDINGS: A left total hip arthroplasty is again noted without evidence of dislocation. There has been interval placement of a lateral fixation plate extending from the greater trochanter to the mid femoral shaft level with multiple wires in place. Displacement of the chronic greater trochanter fracture fragment has been reduced. No new fracture is identified. IMPRESSION: Interval ORIF of left greater trochanter fracture. Electronically Signed   By: Logan Bores M.D.   On: 04/16/2017 17:41    Disposition: 03-Skilled Nursing Facility  Discharge Instructions    Partial weight bearing   Complete by:  As directed    % Body Weight:  50%   Laterality:  left      Follow-up Information    Frederik Pear, MD Follow up in 2 week(s).   Specialty:  Orthopedic Surgery Contact information: [place] [address] [phone]        [facility] Follow up.   Specialty:  Mellette Why:  A representative from [facility] will contact you to arrange start date and time for your therapy. Contact information: [address] [place] [address] [phone]            Signed: Jolyn Nap 04/18/2017, 8:47 AM

## 2017-04-18 NOTE — Progress Notes (Signed)
Physical Therapy Treatment Patient Details Name: [name] [name] MRN: [id number] DOB: 1940/04/29 Today's Date: 04/18/2017    History of Present Illness 77 y.o. male s/p L Hip ORIF 2/15. PMH includes: HTN, Hyperlipidemia, L THA with revision.    PT Comments    Continuing work on functional mobility and activity tolerance;  Needing cues and reinforcement of 50%PWB, especially when he is in a bit of a rush; Much better when taking time and focusing on using UEs/upper body to unweigh LLE in stance for 50% PWB; good progression with practice; We covered stairs -- he opted to perform stair ascending and descending sideways with L rail, and he was able to use the rail to unweigh LLE for RLE stepping up -- I recommend he take the stairs VERY SLOWLY;   Given 50% PWB and Posterior Precautions (as well as hip surgery re-dos), I entered an order for OT for ADLs per protocol;   [name] voiced to me that he would like another day here acutely for more practice and PT before dc home; this is not unreasonable given WB and motion restrictions; I discussed this with [place], RN  Follow Up Recommendations  Home health PT;Supervision for mobility/OOB     Equipment Recommendations  None recommended by PT(well-equipped from previous surgeries)    Recommendations for Other Services OT consult(ordered per protocol)     Precautions / Restrictions Precautions Precautions: Posterior Hip Precaution Booklet Issued: Yes (comment) Precaution Comments: reviewed 3/3 hip precautions + PWB status Restrictions Weight Bearing Restrictions: Yes LLE Weight Bearing: Partial weight bearing LLE Partial Weight Bearing Percentage or Pounds: 50    Mobility  Bed Mobility Overal bed mobility: Needs Assistance Bed Mobility: Supine to Sit     Supine to sit: Supervision;HOB elevated     General bed mobility comments: supervision for safety, and posterior Precautions  Transfers Overall transfer level: Needs  assistance Equipment used: Rolling walker (2 wheeled) Transfers: Sit to/from Stand Sit to Stand: Min guard         General transfer comment: Min Guard for safety; cues for prepositioning for Posterior Precautions  Ambulation/Gait Ambulation/Gait assistance: Min guard Ambulation Distance (Feet): 150 Feet(greater than) Assistive device: Rolling walker (2 wheeled) Gait Pattern/deviations: Step-through pattern(emerging) Gait velocity: decreased   General Gait Details: Cues for sequence, and to use Rw to unweigh LLE in stance to 50% PWB; noted initially impulsive with getting up and walking to bathroom -- likely did not adhere to 50% at that time, but corrected with cues   Stairs Stairs: Yes   Stair Management: One rail Left;Sideways;Backwards;With walker Number of Stairs: 6(2x3) General stair comments: Verbal and demo cues for sequence, technqiue, and keeping precautions with getting up and down the steps  Wheelchair Mobility    Modified Rankin (Stroke Patients Only)       Balance Overall balance assessment: Needs assistance   Sitting balance-Leahy Scale: Good       Standing balance-Leahy Scale: Fair Standing balance comment: RW for dynamic mobility                            Cognition Arousal/Alertness: Awake/alert Behavior During Therapy: WFL for tasks assessed/performed Overall Cognitive Status: Within Functional Limits for tasks assessed                                        Exercises Total Joint  Exercises Ankle Circles/Pumps: 20 reps Quad Sets: AROM;Left;10 reps Gluteal Sets: AROM;Both;10 reps Towel Squeeze: AROM;Both;10 reps Heel Slides: AAROM;Left;10 reps Hip ABduction/ADduction: AROM;Left;10 reps    General Comments        Pertinent Vitals/Pain Pain Assessment: 0-10 Pain Score: 6  Faces Pain Scale: Hurts even more Pain Location: L Hip Pain Descriptors / Indicators: Aching;Discomfort;Contraction Pain  Intervention(s): Monitored during session    Home Living Family/patient expects to be discharged to:: Private residence Living Arrangements: Spouse/significant other Available Help at Discharge: Family Type of Home: House Home Access: Stairs to enter Entrance Stairs-Rails: Right Home Layout: Multi-level Home Equipment: Environmental consultant - 2 wheels;Cane - single point;Shower seat;Bedside commode Additional Comments: new comfort height commodes    Prior Function Level of Independence: Independent      Comments: ADLs, IADLs, works in Occupational [employer]. wife is unsteady and uses cane   PT Goals (current goals can now be found in the care plan section) Acute Rehab PT Goals Patient Stated Goal: he would like more practice with therapies before going home PT Goal Formulation: With patient Time For Goal Achievement: 04/24/17 Potential to Achieve Goals: Good Progress towards PT goals: Progressing toward goals    Frequency    Min 5X/week      PT Plan Current plan remains appropriate    Co-evaluation PT/OT/SLP Co-Evaluation/Treatment: Yes(co-session/dovetail) Reason for Co-Treatment: Other (comment)(due to potential dc home today) PT goals addressed during session: Mobility/safety with mobility;Other (comment)(Posterior Precautions and 50%PWB)        AM-PAC PT "6 Clicks" Daily Activity  Outcome Measure  Difficulty turning over in bed (including adjusting bedclothes, sheets and blankets)?: A Little Difficulty moving from lying on back to sitting on the side of the bed? : A Little Difficulty sitting down on and standing up from a chair with arms (e.g., wheelchair, bedside commode, etc,.)?: A Little Help needed moving to and from a bed to chair (including a wheelchair)?: A Little Help needed walking in hospital room?: A Little Help needed climbing 3-5 steps with a railing? : A Little 6 Click Score: 18    End of Session Equipment Utilized During Treatment: Gait belt Activity Tolerance: Patient  tolerated treatment well Patient left: in chair;with call bell/phone within reach Nurse Communication: Mobility status PT Visit Diagnosis: Unsteadiness on feet (R26.81);Other abnormalities of gait and mobility (R26.89);Muscle weakness (generalized) (M62.81);Other (comment)     Time: 1610-9604 PT Time Calculation (min) (ACUTE ONLY): 45 min  Charges:  $Gait Training: 8-22 mins $Therapeutic Exercise: 8-22 mins                    G Codes:       Roney Marion, PT  Acute Rehabilitation Services Pager [phone] Office [place] 04/18/2017, 11:23 AM

## 2017-04-18 NOTE — Evaluation (Signed)
Occupational Therapy Evaluation Patient Details Name: [name] [name] MRN: [id number] DOB: Jul 01, 1940 Today's Date: 04/18/2017    History of Present Illness 77 y.o. male s/p L Hip ORIF 2/15. PMH includes: HTN, Hyperlipidemia, L THA with revision.   Clinical Impression   PTA, pt was living with his wife and was independent. Pt currently requiring Min Guard for LB ADLs, toileting, and shower transfer. Educating pt on posterior hip precautions and shower transfer with shower seat and RW; pt demonstrating understanding. Return for second visit in PM for education on AE for LB ADLs and adherence to precautions. Pt dressing with Min Guard for safety in standing and Mod cues for adherence to posterior hip precautions with pt attempting to bend forward. Providing all education and answering pt questions in preparation for dc today. Recommend dc home with initial 24 hour supervision for safety and adherence to [facility] and hip precautions.     Follow Up Recommendations  Follow surgeon's recommendation for DC plan and follow-up therapies;Supervision/Assistance - 24 hour    Equipment Recommendations  None recommended by OT    Recommendations for Other Services PT consult     Precautions / Restrictions Precautions Precautions: Posterior Hip Precaution Booklet Issued: Yes (comment) Precaution Comments: reviewed 3/3 hip precautions + PWB status Restrictions Weight Bearing Restrictions: Yes LLE Weight Bearing: Partial weight bearing LLE Partial Weight Bearing Percentage or Pounds: 50      Mobility Bed Mobility Overal bed mobility: Needs Assistance Bed Mobility: Supine to Sit     Supine to sit: Supervision;HOB elevated     General bed mobility comments: supervision for safety  Transfers Overall transfer level: Needs assistance Equipment used: Rolling walker (2 wheeled) Transfers: Sit to/from Stand Sit to Stand: Min guard         General transfer comment: Min Guard for safety     Balance Overall balance assessment: Needs assistance Sitting-balance support: No upper extremity supported;Feet supported Sitting balance-Leahy Scale: Good     Standing balance support: Bilateral upper extremity supported;During functional activity;No upper extremity supported Standing balance-Leahy Scale: Fair Standing balance comment: RW for dynamic mobility                           ADL either performed or assessed with clinical judgement   ADL Overall ADL's : Needs assistance/impaired Eating/Feeding: Set up;Sitting   Grooming: Set up;Supervision/safety;Standing Grooming Details (indicate cue type and reason): Cues for PWB status during grooming Upper Body Bathing: Set up;Supervision/ safety;Sitting   Lower Body Bathing: Sit to/from stand;Min guard Lower Body Bathing Details (indicate cue type and reason): Educated pt on safe LB bathing and adherance to precautions Upper Body Dressing : Set up;Supervision/safety;Standing Upper Body Dressing Details (indicate cue type and reason): donned shirt Lower Body Dressing: Sit to/from stand;Min guard;With adaptive equipment Lower Body Dressing Details (indicate cue type and reason): Providing education on use of AE for LB dressing to adhere to posterior hip precautions. Pt needing increased cues to adhere to precautions. After donning underwear with AE and Min Guard for safety, pt attempting to don pants without AE leaning forward. Toilet Transfer: Chief of Staff Details (indicate cue type and reason): VCs for extending LLE to adhere to posterior hip precautions         Functional mobility during ADLs: Min guard;Rolling walker General ADL Comments: Providing education for LB ADLs, shower transfer, and toileting. Pt requiring increased cues to adherance to prcautions and WBing. Pt very independent and wanting to perform  tasks quickly     Vision Baseline Vision/History: Wears glasses Patient Visual  Report: No change from baseline       Perception     Praxis      Pertinent Vitals/Pain Pain Assessment: 0-10 Pain Score: 6  Faces Pain Scale: Hurts even more Pain Location: L Hip Pain Descriptors / Indicators: Aching;Discomfort;Contraction Pain Intervention(s): Monitored during session;Limited activity within patient's tolerance;Repositioned     Hand Dominance Right   Extremity/Trunk Assessment Upper Extremity Assessment Upper Extremity Assessment: Overall WFL for tasks assessed   Lower Extremity Assessment Lower Extremity Assessment: Defer to PT evaluation   Cervical / Trunk Assessment Cervical / Trunk Assessment: Normal   Communication Communication Communication: No difficulties   Cognition Arousal/Alertness: Awake/alert Behavior During Therapy: WFL for tasks assessed/performed Overall Cognitive Status: Within Functional Limits for tasks assessed                                 General Comments: Requiring increased cues for WBing status and posterior hip precautions   General Comments  Returning for second visit with pt planning to dc today. Daughter present at second visit. Educated daughters on posterior hip precautions and purchasing a Secondary school teacher as needed    Exercises     Shoulder Instructions      Home Living Family/patient expects to be discharged to:: Private residence Living Arrangements: Spouse/significant other Available Help at Discharge: Family Type of Home: House Home Access: Stairs to enter Technical brewer of Steps: 4 Entrance Stairs-Rails: Right Home Layout: Multi-level Alternate Level Stairs-Number of Steps: 2 Alternate Level Stairs-Rails: Right Bathroom Shower/Tub: Hospital doctor Toilet: Handicapped height     Home Equipment: Environmental consultant - 2 wheels;Cane - single point;Shower seat;Bedside commode   Additional Comments: new comfort height commodes      Prior Functioning/Environment Level of Independence:  Independent        Comments: ADLs, IADLs, works in Occupational psychologist. wife is unsteady and uses cane        OT Problem List: Decreased range of motion;Decreased strength;Decreased activity tolerance;Impaired balance (sitting and/or standing);Decreased knowledge of precautions;Decreased knowledge of use of DME or AE;Pain      OT Treatment/Interventions: Self-care/ADL training;Therapeutic exercise;Energy conservation;DME and/or AE instruction;Therapeutic activities;Patient/family education    OT Goals(Current goals can be found in the care plan section) Acute Rehab OT Goals Patient Stated Goal: return home OT Goal Formulation: With patient Time For Goal Achievement: 05/02/17 Potential to Achieve Goals: Good ADL Goals Pt Will Perform Lower Body Dressing: with set-up;with supervision;sit to/from stand;with adaptive equipment Pt Will Transfer to Toilet: with set-up;with supervision;ambulating;bedside commode Pt Will Perform Tub/Shower Transfer: with set-up;with supervision;Shower transfer;shower seat;ambulating;rolling walker  OT Frequency: Min 2X/week   Barriers to D/C:            Co-evaluation PT/OT/SLP Co-Evaluation/Treatment: Yes Reason for Co-Treatment: Other (comment)(due to potential dc home today)   OT goals addressed during session: ADL's and self-care      AM-PAC PT "6 Clicks" Daily Activity     Outcome Measure Help from another person eating meals?: None Help from another person taking care of personal grooming?: None Help from another person toileting, which includes using toliet, bedpan, or urinal?: A Little Help from another person bathing (including washing, rinsing, drying)?: A Little Help from another person to put on and taking off regular upper body clothing?: None Help from another person to put on and taking off regular lower body clothing?: A  Little 6 Click Score: 21   End of Session Equipment Utilized During Treatment: Gait belt;Rolling walker Nurse  Communication: Mobility status;Precautions;Weight bearing status  Activity Tolerance: Patient tolerated treatment well Patient left: in chair;with call bell/phone within reach  OT Visit Diagnosis: Unsteadiness on feet (R26.81);Other abnormalities of gait and mobility (R26.89);Muscle weakness (generalized) (M62.81);Pain Pain - Right/Left: Left Pain - part of body: Hip;Leg                Time: 1031-2811 OT Time Calculation (min): 46 min Charges:  OT General Charges $OT Visit: 1 Visit OT Evaluation $OT Eval Moderate Complexity: 1 Mod OT Treatments $Self Care/Home Management : 8-22 mins G-Codes:     Jahquez Steffler MSOT, OTR/L Acute Rehab Pager: [phone] Office: [place] 04/18/2017, 3:46 PM

## 2017-04-18 NOTE — Anesthesia Postprocedure Evaluation (Signed)
Anesthesia Post Note  Patient: [name] [name]  Procedure(s) Performed: OPEN REDUCTION INTERNAL FIXATION HIP GREATER TROCHANTER (Left Hip)     Patient location during evaluation: PACU Anesthesia Type: Spinal Level of consciousness: awake and alert Pain management: pain level controlled Vital Signs Assessment: post-procedure vital signs reviewed and stable Respiratory status: spontaneous breathing and respiratory function stable Cardiovascular status: blood pressure returned to baseline and stable Postop Assessment: spinal receding and no apparent nausea or vomiting Anesthetic complications: no    Last Vitals:  Vitals:   04/18/17 0522 04/18/17 1434  BP: 108/67 101/63  Pulse: 65 64  Resp: 17 17  Temp: 36.5 C 36.4 C  SpO2: 98% 100%    Last Pain:  Vitals:   04/18/17 1615  TempSrc:   PainSc: [place]

## 2017-04-18 NOTE — Progress Notes (Signed)
[name] [name] to be D/C'd Home per MD order.  Discussed prescriptions and follow up appointments with the patient. Prescriptions given to patient, medication list explained in detail. Pt verbalized understanding.  Allergies as of 04/18/2017      Reactions   Iohexol Shortness Of Breath   Ace Inhibitors Cough      Medication List    STOP taking these medications   acetaminophen 325 MG tablet Commonly known as:  TYLENOL   meloxicam 15 MG tablet Commonly known as:  MOBIC   traMADol 50 MG tablet Commonly known as:  ULTRAM     TAKE these medications   aspirin EC 325 MG tablet Take 1 tablet (325 mg total) by mouth 2 (two) times daily. What changed:    medication strength  how much to take  when to take this   b complex vitamins tablet Take 1 tablet by mouth daily.   bisoprolol-hydrochlorothiazide 5-6.25 MG tablet Commonly known as:  ZIAC TAKE 1 TABLET BY MOUTH EVERY MORNING FOR BLOOD PRESSURE   fenofibrate 145 MG tablet Commonly known as:  TRICOR TAKE 1 TABLET BY MOUTH DAILY FOR TRIGLYCERIDES/BLOOD FATS   finasteride 5 MG tablet Commonly known as:  PROSCAR Take 5 mg by mouth daily.   Fish Oil 1200 MG Caps Take 1 capsule by mouth daily.   FLAX SEEDS PO Take 1,000 mg by mouth daily.   gabapentin 300 MG capsule Commonly known as:  NEURONTIN Take 300 mg by mouth 2 (two) times daily.   losartan 100 MG tablet Commonly known as:  COZAAR TAKE 1 TABLET BY MOUTH DAILY FOR BLOOD PRESSURE.   Magnesium Oxide 250 MG Tabs Take 250 mg by mouth daily.   omeprazole 40 MG capsule Commonly known as:  PRILOSEC Take 40 mg by mouth daily.   oxyCODONE-acetaminophen 5-325 MG tablet Commonly known as:  PERCOCET/ROXICET Take 1 tablet by mouth every 4 (four) hours as needed for severe pain.   tamsulosin 0.4 MG Caps capsule Commonly known as:  FLOMAX Take 0.4 mg by mouth daily after breakfast.   tiZANidine 2 MG tablet Commonly known as:  ZANAFLEX Take 1 tablet (2 mg total) by  mouth every 6 (six) hours as needed for muscle spasms.   VITAMIN D PO Take 4,000-6,000 Units by mouth See admin instructions. 6000 iu in the morning, and 4000 iu in the evening            Durable Medical Equipment  (From admission, onward)        Start     Ordered   04/16/17 1750  DME Walker rolling  Once    Question:  Patient needs a walker to treat with the following condition  Answer:  Status post total hip replacement, left   04/16/17 1749   04/16/17 1750  DME 3 n 1  Once     04/16/17 1749   04/16/17 1750  DME Bedside commode  Once    Question:  Patient needs a bedside commode to treat with the following condition  Answer:  Status post total hip replacement, left   04/16/17 1749       Discharge Care Instructions  (From admission, onward)        Start     Ordered   04/16/17 0000  Partial weight bearing    Question Answer Comment  % Body Weight 50%   Laterality left      04/16/17 1631      Vitals:   04/18/17 0522 04/18/17 1434  BP: 108/67 101/63  Pulse: 65 64  Resp: 17 17  Temp: 97.7 F (36.5 C) 97.6 F (36.4 C)  SpO2: 98% 100%    Skin clean, dry and intact without evidence of skin break down, no evidence of skin tears noted. Aquacel dressing on left hip, clean, dry, and intact. IV catheter discontinued intact. Site without signs and symptoms of complications. Dressing and pressure applied. Pt denies pain at this time. No complaints noted.  An After Visit Summary and prescriptions were printed and given to the patient. Patient escorted via [place], and D/C home via private auto.  [place]nes Mill RN

## 2017-04-19 ENCOUNTER — Encounter (HOSPITAL_COMMUNITY): Payer: Self-pay | Admitting: Orthopedic Surgery

## 2017-04-19 DIAGNOSIS — E785 Hyperlipidemia, unspecified: Secondary | ICD-10-CM | POA: Diagnosis not present

## 2017-04-19 DIAGNOSIS — K222 Esophageal obstruction: Secondary | ICD-10-CM | POA: Diagnosis not present

## 2017-04-19 DIAGNOSIS — N200 Calculus of kidney: Secondary | ICD-10-CM | POA: Diagnosis not present

## 2017-04-19 DIAGNOSIS — R7303 Prediabetes: Secondary | ICD-10-CM | POA: Diagnosis not present

## 2017-04-19 DIAGNOSIS — I129 Hypertensive chronic kidney disease with stage 1 through stage 4 chronic kidney disease, or unspecified chronic kidney disease: Secondary | ICD-10-CM | POA: Diagnosis not present

## 2017-04-19 DIAGNOSIS — S72112D Displaced fracture of greater trochanter of left femur, subsequent encounter for closed fracture with routine healing: Secondary | ICD-10-CM | POA: Diagnosis not present

## 2017-04-19 DIAGNOSIS — E559 Vitamin D deficiency, unspecified: Secondary | ICD-10-CM | POA: Diagnosis not present

## 2017-04-19 DIAGNOSIS — K219 Gastro-esophageal reflux disease without esophagitis: Secondary | ICD-10-CM | POA: Diagnosis not present

## 2017-04-19 DIAGNOSIS — E210 Primary hyperparathyroidism: Secondary | ICD-10-CM | POA: Diagnosis not present

## 2017-04-19 DIAGNOSIS — Z96642 Presence of left artificial hip joint: Secondary | ICD-10-CM | POA: Diagnosis not present

## 2017-04-19 DIAGNOSIS — N183 Chronic kidney disease, stage 3 (moderate): Secondary | ICD-10-CM | POA: Diagnosis not present

## 2017-04-20 DIAGNOSIS — N183 Chronic kidney disease, stage 3 (moderate): Secondary | ICD-10-CM | POA: Diagnosis not present

## 2017-04-20 DIAGNOSIS — R339 Retention of urine, unspecified: Secondary | ICD-10-CM | POA: Diagnosis not present

## 2017-04-20 DIAGNOSIS — N200 Calculus of kidney: Secondary | ICD-10-CM | POA: Diagnosis not present

## 2017-04-20 DIAGNOSIS — S72112D Displaced fracture of greater trochanter of left femur, subsequent encounter for closed fracture with routine healing: Secondary | ICD-10-CM | POA: Diagnosis not present

## 2017-04-20 DIAGNOSIS — Z96642 Presence of left artificial hip joint: Secondary | ICD-10-CM | POA: Diagnosis not present

## 2017-04-20 DIAGNOSIS — E210 Primary hyperparathyroidism: Secondary | ICD-10-CM | POA: Diagnosis not present

## 2017-04-20 DIAGNOSIS — K222 Esophageal obstruction: Secondary | ICD-10-CM | POA: Diagnosis not present

## 2017-04-20 DIAGNOSIS — I129 Hypertensive chronic kidney disease with stage 1 through stage 4 chronic kidney disease, or unspecified chronic kidney disease: Secondary | ICD-10-CM | POA: Diagnosis not present

## 2017-04-20 DIAGNOSIS — K219 Gastro-esophageal reflux disease without esophagitis: Secondary | ICD-10-CM | POA: Diagnosis not present

## 2017-04-20 DIAGNOSIS — E785 Hyperlipidemia, unspecified: Secondary | ICD-10-CM | POA: Diagnosis not present

## 2017-04-20 DIAGNOSIS — R7303 Prediabetes: Secondary | ICD-10-CM | POA: Diagnosis not present

## 2017-04-20 DIAGNOSIS — E559 Vitamin D deficiency, unspecified: Secondary | ICD-10-CM | POA: Diagnosis not present

## 2017-04-22 ENCOUNTER — Ambulatory Visit: Payer: Self-pay | Admitting: Internal Medicine

## 2017-04-23 DIAGNOSIS — K219 Gastro-esophageal reflux disease without esophagitis: Secondary | ICD-10-CM | POA: Diagnosis not present

## 2017-04-23 DIAGNOSIS — I129 Hypertensive chronic kidney disease with stage 1 through stage 4 chronic kidney disease, or unspecified chronic kidney disease: Secondary | ICD-10-CM | POA: Diagnosis not present

## 2017-04-23 DIAGNOSIS — N183 Chronic kidney disease, stage 3 (moderate): Secondary | ICD-10-CM | POA: Diagnosis not present

## 2017-04-23 DIAGNOSIS — E785 Hyperlipidemia, unspecified: Secondary | ICD-10-CM | POA: Diagnosis not present

## 2017-04-23 DIAGNOSIS — K222 Esophageal obstruction: Secondary | ICD-10-CM | POA: Diagnosis not present

## 2017-04-23 DIAGNOSIS — E559 Vitamin D deficiency, unspecified: Secondary | ICD-10-CM | POA: Diagnosis not present

## 2017-04-23 DIAGNOSIS — S72112D Displaced fracture of greater trochanter of left femur, subsequent encounter for closed fracture with routine healing: Secondary | ICD-10-CM | POA: Diagnosis not present

## 2017-04-23 DIAGNOSIS — Z96642 Presence of left artificial hip joint: Secondary | ICD-10-CM | POA: Diagnosis not present

## 2017-04-23 DIAGNOSIS — N200 Calculus of kidney: Secondary | ICD-10-CM | POA: Diagnosis not present

## 2017-04-23 DIAGNOSIS — R7303 Prediabetes: Secondary | ICD-10-CM | POA: Diagnosis not present

## 2017-04-23 DIAGNOSIS — E210 Primary hyperparathyroidism: Secondary | ICD-10-CM | POA: Diagnosis not present

## 2017-04-26 DIAGNOSIS — S72112D Displaced fracture of greater trochanter of left femur, subsequent encounter for closed fracture with routine healing: Secondary | ICD-10-CM | POA: Diagnosis not present

## 2017-04-26 DIAGNOSIS — N200 Calculus of kidney: Secondary | ICD-10-CM | POA: Diagnosis not present

## 2017-04-26 DIAGNOSIS — R7303 Prediabetes: Secondary | ICD-10-CM | POA: Diagnosis not present

## 2017-04-26 DIAGNOSIS — I129 Hypertensive chronic kidney disease with stage 1 through stage 4 chronic kidney disease, or unspecified chronic kidney disease: Secondary | ICD-10-CM | POA: Diagnosis not present

## 2017-04-26 DIAGNOSIS — K219 Gastro-esophageal reflux disease without esophagitis: Secondary | ICD-10-CM | POA: Diagnosis not present

## 2017-04-26 DIAGNOSIS — K222 Esophageal obstruction: Secondary | ICD-10-CM | POA: Diagnosis not present

## 2017-04-26 DIAGNOSIS — E785 Hyperlipidemia, unspecified: Secondary | ICD-10-CM | POA: Diagnosis not present

## 2017-04-26 DIAGNOSIS — E559 Vitamin D deficiency, unspecified: Secondary | ICD-10-CM | POA: Diagnosis not present

## 2017-04-26 DIAGNOSIS — E210 Primary hyperparathyroidism: Secondary | ICD-10-CM | POA: Diagnosis not present

## 2017-04-26 DIAGNOSIS — Z96642 Presence of left artificial hip joint: Secondary | ICD-10-CM | POA: Diagnosis not present

## 2017-04-26 DIAGNOSIS — N183 Chronic kidney disease, stage 3 (moderate): Secondary | ICD-10-CM | POA: Diagnosis not present

## 2017-04-27 DIAGNOSIS — K222 Esophageal obstruction: Secondary | ICD-10-CM | POA: Diagnosis not present

## 2017-04-27 DIAGNOSIS — N183 Chronic kidney disease, stage 3 (moderate): Secondary | ICD-10-CM | POA: Diagnosis not present

## 2017-04-27 DIAGNOSIS — E210 Primary hyperparathyroidism: Secondary | ICD-10-CM | POA: Diagnosis not present

## 2017-04-27 DIAGNOSIS — R7303 Prediabetes: Secondary | ICD-10-CM | POA: Diagnosis not present

## 2017-04-27 DIAGNOSIS — N200 Calculus of kidney: Secondary | ICD-10-CM | POA: Diagnosis not present

## 2017-04-27 DIAGNOSIS — Z96642 Presence of left artificial hip joint: Secondary | ICD-10-CM | POA: Diagnosis not present

## 2017-04-27 DIAGNOSIS — E785 Hyperlipidemia, unspecified: Secondary | ICD-10-CM | POA: Diagnosis not present

## 2017-04-27 DIAGNOSIS — I129 Hypertensive chronic kidney disease with stage 1 through stage 4 chronic kidney disease, or unspecified chronic kidney disease: Secondary | ICD-10-CM | POA: Diagnosis not present

## 2017-04-27 DIAGNOSIS — K219 Gastro-esophageal reflux disease without esophagitis: Secondary | ICD-10-CM | POA: Diagnosis not present

## 2017-04-27 DIAGNOSIS — E559 Vitamin D deficiency, unspecified: Secondary | ICD-10-CM | POA: Diagnosis not present

## 2017-04-27 DIAGNOSIS — S72112D Displaced fracture of greater trochanter of left femur, subsequent encounter for closed fracture with routine healing: Secondary | ICD-10-CM | POA: Diagnosis not present

## 2017-04-28 DIAGNOSIS — S72112D Displaced fracture of greater trochanter of left femur, subsequent encounter for closed fracture with routine healing: Secondary | ICD-10-CM | POA: Diagnosis not present

## 2017-04-30 ENCOUNTER — Telehealth: Payer: Self-pay | Admitting: *Deleted

## 2017-04-30 DIAGNOSIS — Z96642 Presence of left artificial hip joint: Secondary | ICD-10-CM | POA: Diagnosis not present

## 2017-04-30 DIAGNOSIS — K219 Gastro-esophageal reflux disease without esophagitis: Secondary | ICD-10-CM | POA: Diagnosis not present

## 2017-04-30 DIAGNOSIS — E785 Hyperlipidemia, unspecified: Secondary | ICD-10-CM | POA: Diagnosis not present

## 2017-04-30 DIAGNOSIS — N200 Calculus of kidney: Secondary | ICD-10-CM | POA: Diagnosis not present

## 2017-04-30 DIAGNOSIS — E559 Vitamin D deficiency, unspecified: Secondary | ICD-10-CM | POA: Diagnosis not present

## 2017-04-30 DIAGNOSIS — E210 Primary hyperparathyroidism: Secondary | ICD-10-CM | POA: Diagnosis not present

## 2017-04-30 DIAGNOSIS — K222 Esophageal obstruction: Secondary | ICD-10-CM | POA: Diagnosis not present

## 2017-04-30 DIAGNOSIS — I129 Hypertensive chronic kidney disease with stage 1 through stage 4 chronic kidney disease, or unspecified chronic kidney disease: Secondary | ICD-10-CM | POA: Diagnosis not present

## 2017-04-30 DIAGNOSIS — R7303 Prediabetes: Secondary | ICD-10-CM | POA: Diagnosis not present

## 2017-04-30 DIAGNOSIS — N183 Chronic kidney disease, stage 3 (moderate): Secondary | ICD-10-CM | POA: Diagnosis not present

## 2017-04-30 DIAGNOSIS — S72112D Displaced fracture of greater trochanter of left femur, subsequent encounter for closed fracture with routine healing: Secondary | ICD-10-CM | POA: Diagnosis not present

## 2017-04-30 NOTE — Telephone Encounter (Signed)
Patient called and requested advise regarding constipation, due to pain meds following surgery. Per Dr Melford Aase, increase magnesium to 500 mg twice a day and take Miralax as directed.

## 2017-05-01 ENCOUNTER — Encounter (HOSPITAL_COMMUNITY): Payer: Self-pay | Admitting: Emergency Medicine

## 2017-05-01 ENCOUNTER — Emergency Department (HOSPITAL_COMMUNITY): Payer: Medicare Other

## 2017-05-01 ENCOUNTER — Inpatient Hospital Stay (HOSPITAL_COMMUNITY)
Admission: EM | Admit: 2017-05-01 | Discharge: 2017-05-05 | DRG: 698 | Disposition: A | Discharge status: Home / Self Care | Payer: Medicare Other | Attending: Internal Medicine | Admitting: Internal Medicine

## 2017-05-01 DIAGNOSIS — D649 Anemia, unspecified: Secondary | ICD-10-CM | POA: Diagnosis not present

## 2017-05-01 DIAGNOSIS — R404 Transient alteration of awareness: Secondary | ICD-10-CM | POA: Diagnosis not present

## 2017-05-01 DIAGNOSIS — E1122 Type 2 diabetes mellitus with diabetic chronic kidney disease: Secondary | ICD-10-CM | POA: Diagnosis not present

## 2017-05-01 DIAGNOSIS — E861 Hypovolemia: Secondary | ICD-10-CM | POA: Diagnosis present

## 2017-05-01 DIAGNOSIS — E210 Primary hyperparathyroidism: Secondary | ICD-10-CM | POA: Diagnosis not present

## 2017-05-01 DIAGNOSIS — R531 Weakness: Secondary | ICD-10-CM | POA: Diagnosis not present

## 2017-05-01 DIAGNOSIS — K567 Ileus, unspecified: Secondary | ICD-10-CM | POA: Diagnosis present

## 2017-05-01 DIAGNOSIS — R7881 Bacteremia: Secondary | ICD-10-CM | POA: Diagnosis not present

## 2017-05-01 DIAGNOSIS — E213 Hyperparathyroidism, unspecified: Secondary | ICD-10-CM | POA: Diagnosis present

## 2017-05-01 DIAGNOSIS — E119 Type 2 diabetes mellitus without complications: Secondary | ICD-10-CM | POA: Diagnosis not present

## 2017-05-01 DIAGNOSIS — T83518A Infection and inflammatory reaction due to other urinary catheter, initial encounter: Principal | ICD-10-CM | POA: Diagnosis present

## 2017-05-01 DIAGNOSIS — R609 Edema, unspecified: Secondary | ICD-10-CM | POA: Diagnosis not present

## 2017-05-01 DIAGNOSIS — A4151 Sepsis due to Escherichia coli [E. coli]: Secondary | ICD-10-CM | POA: Diagnosis not present

## 2017-05-01 DIAGNOSIS — I129 Hypertensive chronic kidney disease with stage 1 through stage 4 chronic kidney disease, or unspecified chronic kidney disease: Secondary | ICD-10-CM | POA: Diagnosis present

## 2017-05-01 DIAGNOSIS — N3000 Acute cystitis without hematuria: Secondary | ICD-10-CM

## 2017-05-01 DIAGNOSIS — E538 Deficiency of other specified B group vitamins: Secondary | ICD-10-CM | POA: Diagnosis not present

## 2017-05-01 DIAGNOSIS — K5900 Constipation, unspecified: Secondary | ICD-10-CM | POA: Diagnosis not present

## 2017-05-01 DIAGNOSIS — N179 Acute kidney failure, unspecified: Secondary | ICD-10-CM | POA: Diagnosis not present

## 2017-05-01 DIAGNOSIS — B9620 Unspecified Escherichia coli [E. coli] as the cause of diseases classified elsewhere: Secondary | ICD-10-CM | POA: Diagnosis present

## 2017-05-01 DIAGNOSIS — Z79899 Other long term (current) drug therapy: Secondary | ICD-10-CM

## 2017-05-01 DIAGNOSIS — N401 Enlarged prostate with lower urinary tract symptoms: Secondary | ICD-10-CM | POA: Diagnosis present

## 2017-05-01 DIAGNOSIS — K219 Gastro-esophageal reflux disease without esophagitis: Secondary | ICD-10-CM | POA: Diagnosis present

## 2017-05-01 DIAGNOSIS — Z888 Allergy status to other drugs, medicaments and biological substances status: Secondary | ICD-10-CM

## 2017-05-01 DIAGNOSIS — N170 Acute kidney failure with tubular necrosis: Secondary | ICD-10-CM | POA: Diagnosis not present

## 2017-05-01 DIAGNOSIS — R739 Hyperglycemia, unspecified: Secondary | ICD-10-CM | POA: Diagnosis not present

## 2017-05-01 DIAGNOSIS — R6521 Severe sepsis with septic shock: Secondary | ICD-10-CM | POA: Diagnosis not present

## 2017-05-01 DIAGNOSIS — R338 Other retention of urine: Secondary | ICD-10-CM | POA: Diagnosis not present

## 2017-05-01 DIAGNOSIS — I10 Essential (primary) hypertension: Secondary | ICD-10-CM | POA: Diagnosis not present

## 2017-05-01 DIAGNOSIS — Z6838 Body mass index (BMI) 38.0-38.9, adult: Secondary | ICD-10-CM

## 2017-05-01 DIAGNOSIS — Z452 Encounter for adjustment and management of vascular access device: Secondary | ICD-10-CM

## 2017-05-01 DIAGNOSIS — E871 Hypo-osmolality and hyponatremia: Secondary | ICD-10-CM | POA: Diagnosis present

## 2017-05-01 DIAGNOSIS — R0600 Dyspnea, unspecified: Secondary | ICD-10-CM | POA: Diagnosis not present

## 2017-05-01 DIAGNOSIS — E785 Hyperlipidemia, unspecified: Secondary | ICD-10-CM | POA: Diagnosis present

## 2017-05-01 DIAGNOSIS — Y846 Urinary catheterization as the cause of abnormal reaction of the patient, or of later complication, without mention of misadventure at the time of the procedure: Secondary | ICD-10-CM | POA: Diagnosis present

## 2017-05-01 DIAGNOSIS — E876 Hypokalemia: Secondary | ICD-10-CM | POA: Diagnosis present

## 2017-05-01 DIAGNOSIS — N390 Urinary tract infection, site not specified: Secondary | ICD-10-CM | POA: Diagnosis present

## 2017-05-01 DIAGNOSIS — E669 Obesity, unspecified: Secondary | ICD-10-CM | POA: Diagnosis present

## 2017-05-01 DIAGNOSIS — R140 Abdominal distension (gaseous): Secondary | ICD-10-CM

## 2017-05-01 DIAGNOSIS — I351 Nonrheumatic aortic (valve) insufficiency: Secondary | ICD-10-CM | POA: Diagnosis not present

## 2017-05-01 DIAGNOSIS — Z7982 Long term (current) use of aspirin: Secondary | ICD-10-CM

## 2017-05-01 DIAGNOSIS — E1165 Type 2 diabetes mellitus with hyperglycemia: Secondary | ICD-10-CM | POA: Diagnosis present

## 2017-05-01 DIAGNOSIS — A419 Sepsis, unspecified organism: Secondary | ICD-10-CM

## 2017-05-01 DIAGNOSIS — Z87891 Personal history of nicotine dependence: Secondary | ICD-10-CM

## 2017-05-01 DIAGNOSIS — Z1611 Resistance to penicillins: Secondary | ICD-10-CM | POA: Diagnosis present

## 2017-05-01 DIAGNOSIS — R52 Pain, unspecified: Secondary | ICD-10-CM

## 2017-05-01 DIAGNOSIS — T502X5A Adverse effect of carbonic-anhydrase inhibitors, benzothiadiazides and other diuretics, initial encounter: Secondary | ICD-10-CM | POA: Diagnosis not present

## 2017-05-01 DIAGNOSIS — N183 Chronic kidney disease, stage 3 unspecified: Secondary | ICD-10-CM | POA: Diagnosis present

## 2017-05-01 DIAGNOSIS — I509 Heart failure, unspecified: Secondary | ICD-10-CM

## 2017-05-01 DIAGNOSIS — Z96642 Presence of left artificial hip joint: Secondary | ICD-10-CM | POA: Diagnosis present

## 2017-05-01 LAB — CBC WITH DIFFERENTIAL/PLATELET
Basophils Absolute: 0 10*3/uL (ref 0.0–0.1)
Basophils Relative: 0 %
Eosinophils Absolute: 0 10*3/uL (ref 0.0–0.7)
Eosinophils Relative: 0 %
HCT: 25 % — ABNORMAL LOW (ref 39.0–52.0)
Hemoglobin: 8.1 g/dL — ABNORMAL LOW (ref 13.0–17.0)
Lymphocytes Relative: 4 %
Lymphs Abs: 0.7 10*3/uL (ref 0.7–4.0)
MCH: 28.7 pg (ref 26.0–34.0)
MCHC: 32.4 g/dL (ref 30.0–36.0)
MCV: 88.7 fL (ref 78.0–100.0)
Monocytes Absolute: 1.5 10*3/uL — ABNORMAL HIGH (ref 0.1–1.0)
Monocytes Relative: 7 %
Neutro Abs: 18.4 10*3/uL — ABNORMAL HIGH (ref 1.7–7.7)
Neutrophils Relative %: 89 %
Platelets: 430 10*3/uL — ABNORMAL HIGH (ref 150–400)
RBC: 2.82 MIL/uL — ABNORMAL LOW (ref 4.22–5.81)
RDW: 15.6 % — ABNORMAL HIGH (ref 11.5–15.5)
WBC: 20.6 10*3/uL — ABNORMAL HIGH (ref 4.0–10.5)

## 2017-05-01 LAB — BASIC METABOLIC PANEL
Anion gap: 11 (ref 5–15)
BUN: 30 mg/dL — ABNORMAL HIGH (ref 6–20)
CO2: 20 mmol/L — ABNORMAL LOW (ref 22–32)
Calcium: 8.3 mg/dL — ABNORMAL LOW (ref 8.9–10.3)
Chloride: 98 mmol/L — ABNORMAL LOW (ref 101–111)
Creatinine, Ser: 2.23 mg/dL — ABNORMAL HIGH (ref 0.61–1.24)
GFR calc Af Amer: 32 mL/min — ABNORMAL LOW (ref >60)
GFR calc non Af Amer: 27 mL/min — ABNORMAL LOW (ref >60)
Glucose, Bld: 160 mg/dL — ABNORMAL HIGH (ref 65–99)
Potassium: 3.4 mmol/L — ABNORMAL LOW (ref 3.5–5.1)
Sodium: 129 mmol/L — ABNORMAL LOW (ref 135–145)

## 2017-05-01 LAB — I-STAT VENOUS BLOOD GAS, ED
Acid-base deficit: 4 mmol/L — ABNORMAL HIGH (ref 0.0–2.0)
Bicarbonate: 20 mmol/L (ref 20.0–28.0)
O2 Saturation: 99 %
TCO2: 21 mmol/L — ABNORMAL LOW (ref 22–32)
pCO2, Ven: 30.1 mmHg — ABNORMAL LOW (ref 44.0–60.0)
pH, Ven: 7.431 — ABNORMAL HIGH (ref 7.250–7.430)
pO2, Ven: 146 mmHg — ABNORMAL HIGH (ref 32.0–45.0)

## 2017-05-01 LAB — I-STAT TROPONIN, ED: Troponin i, poc: 0.02 ng/mL (ref 0.00–0.08)

## 2017-05-01 LAB — I-STAT CG4 LACTIC ACID, ED: Lactic Acid, Venous: 2.07 mmol/L (ref 0.5–1.9)

## 2017-05-01 LAB — CBG MONITORING, ED: Glucose-Capillary: 164 mg/dL — ABNORMAL HIGH (ref 65–99)

## 2017-05-01 MED ORDER — SODIUM CHLORIDE 0.9 % IV BOLUS (SEPSIS)
1000.0000 mL | Freq: Once | INTRAVENOUS | Status: AC
  Administered 2017-05-01: 1000 mL via INTRAVENOUS

## 2017-05-01 NOTE — ED Provider Notes (Signed)
[facility] EMERGENCY DEPARTMENT Provider Note   CSN: [id number] Arrival date & time: 05/01/17  2203     History   Chief Complaint Chief Complaint  Patient presents with  . Hypotensive/Hyperglycemia    HPI [name] [name] is a 77 y.o. male.  The history is provided by the patient and medical records.    77 year old male with history of arthritis, BPH, GERD, chronic gastritis, kidney stones, hyperparathyroidism, hyperlipidemia, hypertension, prediabetes, presenting to the ED with hypotension and elevated blood sugar.  Over the past several months patient has been having a lot of issues with his left hip.  He went in 2 weeks ago  to have it "cleaned out" with Dr. Mayer Camel.  States it is been doing better and he has been on some home pain medication and muscle relaxers but states he feels like it is not agreeing with him.  It is caused him a lot of issues with constipation and whenever he tried to eat solid food his stomach was twofold to digest it so he would vomit.  States he has mostly been eating ice cream and drinking soda.  He reports he tried to stop taking the pain medication but then he had worsening pain in that hip so he had to start taking it again earlier this week.  He has been straining to have bowel movements but is only been able to produce a small amount.  States he has been feeling very dehydrated denies any dizziness or lightheadedness.  He has not had any syncopal events or falls.  Does report he had a low-grade fever yesterday that was 100.37F.  He did contact the orthopedic office and was told this was normal but if it went up to 102F he should get evaluated.  Today his daughter noticed that he looked a little clammy so she wanted him evaluated.  He denies any chest pain or shortness of breath.  He has not had any cough or other infectious symptoms.  Patient was hypotensive with EMS into the upper 93G systolic.  States of the past several weeks he has had lower  blood pressures than normal, some into the 90 systolic.  States the day of his surgery it was only 182 systolic.  He has talked with his primary care doctor about this as he is on 2 different blood pressure medications, but they did not want to discontinue anything at that time.  CBG earlier this evening was 336.  States he has never had a blood sugar that high before.  Past Medical History:  Diagnosis Date  . Arthritis    in left hip  . ED (erectile dysfunction)   . Enlarged prostate with lower urinary tract symptoms (LUTS)   . GERD (gastroesophageal reflux disease)   . History of chronic gastritis   . History of colon polyps    hyperplastic 2006  . History of esophageal stricture    S/P  DILATATION 2009; 2010; 2010 2012  . History of kidney stones    hx. multiple kidney stones  . History of kidney stones   . History of primary hyperparathyroidism    s/p  left superior parathyroidectomy 07-10-2014  . Hyperlipidemia   . Hypertension   . Left ureteral stone   . Nephrolithiasis    left side   . Pre-diabetes     Patient Active Problem List   Diagnosis Date Noted  . Primary osteoarthritis of left hip 04/16/2017  . Greater trochanter fracture ([place]) 04/13/2017  .  Failed total hip arthroplasty ([place]) 11/18/2016  . OA (osteoarthritis) of hip 06/24/2016  . CKD (chronic kidney disease) stage 3, GFR 30-59 ml/min (HCC) 03/25/2015  . Snoring 03/25/2015  . Medicare annual wellness visit, subsequent 11/27/2014  . Hyperparathyroidism, primary ([place]) 07/09/2014  . Vitamin D deficiency 06/13/2013  . Prediabetes 06/13/2013  . Medication management 06/13/2013  . Hyperlipidemia   . Hypertension   . GERD (gastroesophageal reflux disease)   . Kidney stones   . Morbidly obese (34.39)   . Stricture and stenosis of esophagus 10/18/2008    Past Surgical History:  Procedure Laterality Date  . COLONOSCOPY  02/17/2005  . CYSTO/  LEFT RETROGRADE PYELOGRAM/ STENT PLACEMENT  01/26/2005  . CYSTOSCOPY  W/ URETEROSCOPY W/ LITHOTRIPSY Left 05/04/2005  . CYSTOSCOPY WITH STENT PLACEMENT Left 12/30/2015   Procedure: CYSTOSCOPY WITH STENT PLACEMENT;  Surgeon: Franchot Gallo, MD;  Location: [facility];  Service: Urology;  Laterality: Left;  . CYSTOSCOPY/RETROGRADE/URETEROSCOPY/STONE EXTRACTION WITH BASKET Left 12/30/2015   Procedure: CYSTOSCOPY/RETROGRADE/URETEROSCOPY/STONE EXTRACTION WITH BASKET;  Surgeon: Franchot Gallo, MD;  Location: [facility];  Service: Urology;  Laterality: Left;  . CYSTOSCOPY/URETEROSCOPY/HOLMIUM LASER/STENT PLACEMENT Left 03/09/2016   Procedure: CYSTOSCOPY/RETROGRADE PYELOGRAM/URETEROSCOPY/BASKET STONE EXTRACTION/STENT PLACEMENT;  Surgeon: Franchot Gallo, MD;  Location: [facility];  Service: Urology;  Laterality: Left;  . ESOPHAGOGASTRODUODENOSCOPY (EGD) WITH ESOPHAGEAL DILATION  x4  last one 07-21-2010  . HOLMIUM LASER APPLICATION Left 09/32/6712   Procedure: HOLMIUM LASER APPLICATION;  Surgeon: Franchot Gallo, MD;  Location: [facility];  Service: Urology;  Laterality: Left;  . INGUINAL HERNIA REPAIR Left yrs ago  . NEPHROLITHOTOMY Left 02/01/2014   Procedure: NEPHROLITHOTOMY PERCUTANEOUS;  Surgeon: Jorja Loa, MD;  Location: [facility];  Service: Urology;  Laterality: Left;  . ORIF HIP FRACTURE Left 04/16/2017   Procedure: OPEN REDUCTION INTERNAL FIXATION HIP GREATER TROCHANTER;  Surgeon: Frederik Pear, MD;  Location: [facility];  Service: Orthopedics;  Laterality: Left;  . PARATHYROIDECTOMY Left 07/10/2014   Procedure: LEFT SUPERIOR PARATHYROIDECTOMY;  Surgeon: Armandina Gemma, MD;  Location: [facility];  Service: General;  Laterality: Left;  . TOTAL HIP ARTHROPLASTY Left 06/24/2016   Procedure: LEFT TOTAL HIP ARTHROPLASTY ANTERIOR APPROACH;  Surgeon: Gaynelle Arabian, MD;  Location: [facility];  Service: Orthopedics;  Laterality: Left;  . TOTAL HIP REVISION Left 11/18/2016   Procedure: Left femoral revision - posterior approach;  Surgeon:  Gaynelle Arabian, MD;  Location: [facility];  Service: Orthopedics;  Laterality: Left;  . tumor ear Left age 61    topical growth behind left ear.       Home Medications    Prior to Admission medications   Medication Sig Start Date End Date Taking? Authorizing Provider  aspirin EC 325 MG tablet Take 1 tablet (325 mg total) by mouth 2 (two) times daily. 04/16/17   Leighton Parody, PA-C  b complex vitamins tablet Take 1 tablet by mouth daily.    [provider]  bisoprolol-hydrochlorothiazide (ZIAC) 5-6.25 MG tablet TAKE 1 TABLET BY MOUTH EVERY MORNING FOR BLOOD PRESSURE 01/25/17   Liane Comber, NP  Cholecalciferol (VITAMIN D PO) Take 4,000-6,000 Units by mouth See admin instructions. 6000 iu in the morning, and 4000 iu in the evening    [provider]  fenofibrate (TRICOR) 145 MG tablet TAKE 1 TABLET BY MOUTH DAILY FOR TRIGLYCERIDES/BLOOD FATS 06/01/16   Unk Pinto, MD  finasteride (PROSCAR) 5 MG tablet Take 5 mg by mouth daily.    [provider]  Flaxseed, Linseed, (FLAX SEEDS  PO) Take 1,000 mg by mouth daily.     [provider]  gabapentin (NEURONTIN) 300 MG capsule Take 300 mg by mouth 2 (two) times daily.    [provider]  losartan (COZAAR) 100 MG tablet TAKE 1 TABLET BY MOUTH DAILY FOR BLOOD PRESSURE. 03/22/17   Liane Comber, NP  Magnesium Oxide 250 MG TABS Take 250 mg by mouth daily.     [provider]  Omega-3 Fatty Acids (FISH OIL) 1200 MG CAPS Take 1 capsule by mouth daily.     [provider]  omeprazole (PRILOSEC) 40 MG capsule Take 40 mg by mouth daily.    [provider]  oxyCODONE-acetaminophen (PERCOCET/ROXICET) 5-325 MG tablet Take 1 tablet by mouth every 4 (four) hours as needed for severe pain. 04/16/17   Leighton Parody, PA-C  tamsulosin (FLOMAX) 0.4 MG CAPS capsule Take 0.4 mg by mouth daily after breakfast.  11/06/14   [provider]  tiZANidine (ZANAFLEX) 2 MG tablet Take 1 tablet (2  mg total) by mouth every 6 (six) hours as needed for muscle spasms. 04/16/17   Leighton Parody, PA-C    Family History Family History  Problem Relation Age of Onset  . Heart disease Mother   . Alzheimer's disease Mother   . Heart disease Father   . Bladder Cancer Sister     Social History Social History   Tobacco Use  . Smoking status: Former Smoker    Years: 8.00    Last attempt to quit: 03/02/1974    Years since quitting: 43.1  . Smokeless tobacco: Never Used  Substance Use Topics  . Alcohol use: Yes    Alcohol/week: 0.6 - 1.2 oz    Types: 1 - 2 Glasses of wine per week    Comment: q afternoon  . Drug use: No     Allergies   Iohexol and Ace inhibitors   Review of Systems Review of Systems  Gastrointestinal: Positive for constipation, nausea and vomiting.  Endocrine:       Hyperglycemia  All other systems reviewed and are negative.    Physical Exam Updated Vital Signs BP (!) 89/60 (BP Location: Right Arm)   Temp 98.5 F (36.9 C) (Oral)   Resp 16   Ht 5\' 11"  (1.803 m)   Wt 117 kg (258 lb)   SpO2 98%   BMI 35.98 kg/m   Physical Exam  Constitutional: He is oriented to person, place, and time. He appears well-developed and well-nourished.  Obese, NAD  HENT:  Head: Normocephalic and atraumatic.  Mouth/Throat: Oropharynx is clear and moist.  Eyes: Conjunctivae and EOM are normal. Pupils are equal, round, and reactive to light.  Neck: Normal range of motion.  Cardiovascular: Normal rate, regular rhythm and normal heart sounds.  Pulmonary/Chest: Effort normal and breath sounds normal. No stridor. No respiratory distress. He has no wheezes. He has no rhonchi.  Abdominal: Soft. Bowel sounds are normal. There is no tenderness. There is no rebound.  Musculoskeletal: Normal range of motion.  Left hip surgical incision appears clean without any signs of infection, there is no surrounding redness, drainage, warmth to touch  Neurological: He is alert and oriented to  person, place, and time.  Skin: Skin is warm and dry.  Psychiatric: He has a normal mood and affect.  Nursing note and vitals reviewed.    ED Treatments / Results  Labs (all labs ordered are listed, but only abnormal results are displayed) Labs Reviewed  CBC WITH DIFFERENTIAL/PLATELET -  Abnormal; Notable for the following components:      Result Value   WBC 20.6 (*)    RBC 2.82 (*)    Hemoglobin 8.1 (*)    HCT 25.0 (*)    RDW 15.6 (*)    Platelets 430 (*)    Neutro Abs 18.4 (*)    Monocytes Absolute 1.5 (*)    All other components within normal limits  BASIC METABOLIC PANEL - Abnormal; Notable for the following components:   Sodium 129 (*)    Potassium 3.4 (*)    Chloride 98 (*)    CO2 20 (*)    Glucose, Bld 160 (*)    BUN 30 (*)    Creatinine, Ser 2.23 (*)    Calcium 8.3 (*)    GFR calc non Af Amer 27 (*)    GFR calc Af Amer 32 (*)    All other components within normal limits  URINALYSIS, ROUTINE W REFLEX MICROSCOPIC - Abnormal; Notable for the following components:   Color, Urine AMBER (*)    APPearance CLOUDY (*)    Hgb urine dipstick MODERATE (*)    Protein, ur 100 (*)    Nitrite POSITIVE (*)    Leukocytes, UA LARGE (*)    Bacteria, UA MANY (*)    Squamous Epithelial / LPF 0-5 (*)    All other components within normal limits  CBG MONITORING, ED - Abnormal; Notable for the following components:   Glucose-Capillary 164 (*)    All other components within normal limits  I-STAT CG4 LACTIC ACID, ED - Abnormal; Notable for the following components:   Lactic Acid, Venous 2.07 (*)    All other components within normal limits  I-STAT VENOUS BLOOD GAS, ED - Abnormal; Notable for the following components:   pH, Ven 7.431 (*)    pCO2, Ven 30.1 (*)    pO2, Ven 146.0 (*)    TCO2 21 (*)    Acid-base deficit 4.0 (*)    All other components within normal limits  URINE CULTURE  CULTURE, BLOOD (ROUTINE X 2)  CULTURE, BLOOD (ROUTINE X 2)  I-STAT TROPONIN, ED  I-STAT CG4  LACTIC ACID, ED  POC OCCULT BLOOD, ED    EKG  EKG Interpretation None       Radiology Dg Abd Acute W/chest  Result Date: 05/01/2017 CLINICAL DATA:  Constipation.  Recent left hip surgery. EXAM: DG ABDOMEN ACUTE W/ 1V CHEST COMPARISON:  Chest radiograph 04/12/2017 FINDINGS: The cardiomediastinal contours are normal. The lungs are clear. There is no free intra-abdominal air. Air-filled nondistended transverse, descending, and sigmoid colon. No significant formed stool. Few prominent small bowel loops in the left abdomen with air-fluid levels. No radiopaque calculi. No acute osseous abnormalities are seen. Left hip arthroplasty partially included. IMPRESSION: 1. Air-filled nondistended colon with a few prominent small bowel loops in the left abdomen, suggesting generalized ileus. No increased stool burden to suggest constipation. No free air. 2.  No acute pulmonary process. Electronically Signed   By: Jeb Levering M.D.   On: 05/01/2017 23:52    Procedures Procedures (including critical care time)  Medications Ordered in ED Medications  vancomycin (VANCOCIN) IVPB 1000 mg/200 mL premix (1,000 mg Intravenous New Bag/Given 05/02/17 0127)  vancomycin (VANCOCIN) IVPB 1000 mg/200 mL premix (not administered)  piperacillin-tazobactam (ZOSYN) IVPB 3.375 g (not administered)  vancomycin (VANCOCIN) 1,250 mg in sodium chloride 0.9 % 250 mL IVPB (not administered)  sodium chloride 0.9 % bolus 1,000 mL (0 mLs Intravenous Stopped 05/02/17 0020)  sodium chloride 0.9 % bolus 1,000 mL (1,000 mLs Intravenous New Bag/Given 05/02/17 0052)  piperacillin-tazobactam (ZOSYN) IVPB 3.375 g (0 g Intravenous Stopped 05/02/17 0125)     Initial Impression / Assessment and Plan / ED Course  I have reviewed the triage vital signs and the nursing notes.  Pertinent labs & imaging results that were available during my care of the patient were reviewed by me and considered in my medical decision making (see chart for  details).  77 y.o. M here with hyperglycemia and hypotension.  BP 99'I systolic on arrival.  States BP has been low for the past several months, usually 90's or low 100's.  He is AAOx3.  Has been having low-grade fevers at home.  Also seems he has been having some difficulty eating since his hip surgery 2 weeks ago.  On exam abdomen is soft and nontender.  His surgical incision of the left hip is clean without any signs of infection.  He does appear clinically dry.  We will initiate IV fluids, workup pending.  Acute abdominal series with findings of ileus.  He has not had any emesis here, tolerating water.  White blood cell count has continued to rise, now 20.6.  His hemoglobin is also dropped and is now 8.1.  He was told he lost a lot of blood in his procedure 2 weeks ago and it may take some time for these numbers to go back to normal.  His Hemoccult is negative.  UA does reveal UTI.  Also has evidence of acute kidney injury with creatinine of 2.23 today, baseline is around 1.3.  Likely from dehydration.  Blood pressure is responding well to fluids, currently 338 systolic.  Antibiotics have been given, blood and urine cultures pending.  Will admit to hospitalist service for ongoing care.    Discussed with Dr. Myna Hidalgo-- he will admit for ongoing care.  Final Clinical Impressions(s) / ED Diagnoses   Final diagnoses:  Acute cystitis without hematuria  AKI (acute kidney injury) ([place])  Ileus [facility])    ED Discharge Orders    None       Larene Pickett, PA-C 05/02/17 0149    Lajean Saver, MD 05/02/17 [phone]

## 2017-05-01 NOTE — ED Notes (Signed)
PA notified on pt.'s elevated lactic acid result .

## 2017-05-01 NOTE — ED Triage Notes (Signed)
Patient arrived with EMS from home reports hypotension 86/48 and hyperglycemic = 336 this evening with generalized weakness / fatigue and drowsy .

## 2017-05-01 NOTE — ED Notes (Signed)
Patient transported to X-ray 

## 2017-05-02 ENCOUNTER — Inpatient Hospital Stay (HOSPITAL_COMMUNITY): Payer: Medicare Other

## 2017-05-02 ENCOUNTER — Encounter (HOSPITAL_COMMUNITY): Payer: Self-pay | Admitting: Family Medicine

## 2017-05-02 ENCOUNTER — Other Ambulatory Visit (HOSPITAL_COMMUNITY): Payer: Medicare Other

## 2017-05-02 ENCOUNTER — Inpatient Hospital Stay (HOSPITAL_COMMUNITY)
Admit: 2017-05-02 | Discharge: 2017-05-02 | Disposition: A | Discharge status: Home / Self Care | Payer: Medicare Other | Attending: Internal Medicine | Admitting: Internal Medicine

## 2017-05-02 DIAGNOSIS — T83518A Infection and inflammatory reaction due to other urinary catheter, initial encounter: Secondary | ICD-10-CM | POA: Diagnosis present

## 2017-05-02 DIAGNOSIS — E538 Deficiency of other specified B group vitamins: Secondary | ICD-10-CM

## 2017-05-02 DIAGNOSIS — E1165 Type 2 diabetes mellitus with hyperglycemia: Secondary | ICD-10-CM | POA: Diagnosis present

## 2017-05-02 DIAGNOSIS — I10 Essential (primary) hypertension: Secondary | ICD-10-CM | POA: Diagnosis not present

## 2017-05-02 DIAGNOSIS — Z96642 Presence of left artificial hip joint: Secondary | ICD-10-CM | POA: Diagnosis not present

## 2017-05-02 DIAGNOSIS — R609 Edema, unspecified: Secondary | ICD-10-CM | POA: Diagnosis not present

## 2017-05-02 DIAGNOSIS — T502X5A Adverse effect of carbonic-anhydrase inhibitors, benzothiadiazides and other diuretics, initial encounter: Secondary | ICD-10-CM | POA: Diagnosis not present

## 2017-05-02 DIAGNOSIS — I351 Nonrheumatic aortic (valve) insufficiency: Secondary | ICD-10-CM

## 2017-05-02 DIAGNOSIS — R7881 Bacteremia: Secondary | ICD-10-CM | POA: Diagnosis not present

## 2017-05-02 DIAGNOSIS — E213 Hyperparathyroidism, unspecified: Secondary | ICD-10-CM | POA: Diagnosis present

## 2017-05-02 DIAGNOSIS — Z471 Aftercare following joint replacement surgery: Secondary | ICD-10-CM | POA: Diagnosis not present

## 2017-05-02 DIAGNOSIS — E119 Type 2 diabetes mellitus without complications: Secondary | ICD-10-CM

## 2017-05-02 DIAGNOSIS — E210 Primary hyperparathyroidism: Secondary | ICD-10-CM | POA: Diagnosis present

## 2017-05-02 DIAGNOSIS — A419 Sepsis, unspecified organism: Secondary | ICD-10-CM

## 2017-05-02 DIAGNOSIS — A4151 Sepsis due to Escherichia coli [E. coli]: Secondary | ICD-10-CM | POA: Diagnosis present

## 2017-05-02 DIAGNOSIS — R6521 Severe sepsis with septic shock: Secondary | ICD-10-CM | POA: Diagnosis not present

## 2017-05-02 DIAGNOSIS — I129 Hypertensive chronic kidney disease with stage 1 through stage 4 chronic kidney disease, or unspecified chronic kidney disease: Secondary | ICD-10-CM | POA: Diagnosis present

## 2017-05-02 DIAGNOSIS — K567 Ileus, unspecified: Secondary | ICD-10-CM

## 2017-05-02 DIAGNOSIS — N390 Urinary tract infection, site not specified: Secondary | ICD-10-CM

## 2017-05-02 DIAGNOSIS — I509 Heart failure, unspecified: Secondary | ICD-10-CM | POA: Diagnosis not present

## 2017-05-02 DIAGNOSIS — N401 Enlarged prostate with lower urinary tract symptoms: Secondary | ICD-10-CM | POA: Diagnosis present

## 2017-05-02 DIAGNOSIS — N170 Acute kidney failure with tubular necrosis: Secondary | ICD-10-CM | POA: Diagnosis present

## 2017-05-02 DIAGNOSIS — Y846 Urinary catheterization as the cause of abnormal reaction of the patient, or of later complication, without mention of misadventure at the time of the procedure: Secondary | ICD-10-CM | POA: Diagnosis present

## 2017-05-02 DIAGNOSIS — R0602 Shortness of breath: Secondary | ICD-10-CM | POA: Diagnosis not present

## 2017-05-02 DIAGNOSIS — E1122 Type 2 diabetes mellitus with diabetic chronic kidney disease: Secondary | ICD-10-CM | POA: Diagnosis present

## 2017-05-02 DIAGNOSIS — E861 Hypovolemia: Secondary | ICD-10-CM | POA: Diagnosis present

## 2017-05-02 DIAGNOSIS — E669 Obesity, unspecified: Secondary | ICD-10-CM | POA: Diagnosis present

## 2017-05-02 DIAGNOSIS — D649 Anemia, unspecified: Secondary | ICD-10-CM | POA: Diagnosis present

## 2017-05-02 DIAGNOSIS — N183 Chronic kidney disease, stage 3 (moderate): Secondary | ICD-10-CM | POA: Diagnosis not present

## 2017-05-02 DIAGNOSIS — R739 Hyperglycemia, unspecified: Secondary | ICD-10-CM

## 2017-05-02 DIAGNOSIS — R338 Other retention of urine: Secondary | ICD-10-CM | POA: Diagnosis not present

## 2017-05-02 DIAGNOSIS — N179 Acute kidney failure, unspecified: Secondary | ICD-10-CM | POA: Diagnosis not present

## 2017-05-02 DIAGNOSIS — Z6838 Body mass index (BMI) 38.0-38.9, adult: Secondary | ICD-10-CM | POA: Diagnosis not present

## 2017-05-02 DIAGNOSIS — E876 Hypokalemia: Secondary | ICD-10-CM

## 2017-05-02 DIAGNOSIS — E871 Hypo-osmolality and hyponatremia: Secondary | ICD-10-CM

## 2017-05-02 DIAGNOSIS — R140 Abdominal distension (gaseous): Secondary | ICD-10-CM | POA: Diagnosis not present

## 2017-05-02 DIAGNOSIS — Z452 Encounter for adjustment and management of vascular access device: Secondary | ICD-10-CM | POA: Diagnosis not present

## 2017-05-02 DIAGNOSIS — R0600 Dyspnea, unspecified: Secondary | ICD-10-CM | POA: Diagnosis present

## 2017-05-02 DIAGNOSIS — B9620 Unspecified Escherichia coli [E. coli] as the cause of diseases classified elsewhere: Secondary | ICD-10-CM | POA: Diagnosis present

## 2017-05-02 HISTORY — DX: Sepsis, unspecified organism: A41.9

## 2017-05-02 HISTORY — DX: Ileus, unspecified: K56.7

## 2017-05-02 LAB — URINALYSIS, ROUTINE W REFLEX MICROSCOPIC
Bilirubin Urine: NEGATIVE
Glucose, UA: NEGATIVE mg/dL
Ketones, ur: NEGATIVE mg/dL
Nitrite: POSITIVE — AB
Protein, ur: 100 mg/dL — AB
Specific Gravity, Urine: 1.017 (ref 1.005–1.030)
pH: 5 (ref 5.0–8.0)

## 2017-05-02 LAB — RETICULOCYTES
RBC.: 3.18 MIL/uL — ABNORMAL LOW (ref 4.22–5.81)
Retic Count, Absolute: 60.4 10*3/uL (ref 19.0–186.0)
Retic Ct Pct: 1.9 % (ref 0.4–3.1)

## 2017-05-02 LAB — BASIC METABOLIC PANEL
Anion gap: 11 (ref 5–15)
BUN: 28 mg/dL — ABNORMAL HIGH (ref 6–20)
CO2: 16 mmol/L — ABNORMAL LOW (ref 22–32)
Calcium: 7.6 mg/dL — ABNORMAL LOW (ref 8.9–10.3)
Chloride: 101 mmol/L (ref 101–111)
Creatinine, Ser: 2.01 mg/dL — ABNORMAL HIGH (ref 0.61–1.24)
GFR calc Af Amer: 36 mL/min — ABNORMAL LOW (ref >60)
GFR calc non Af Amer: 31 mL/min — ABNORMAL LOW (ref >60)
Glucose, Bld: 139 mg/dL — ABNORMAL HIGH (ref 65–99)
Potassium: 3.7 mmol/L (ref 3.5–5.1)
Sodium: 128 mmol/L — ABNORMAL LOW (ref 135–145)

## 2017-05-02 LAB — TYPE AND SCREEN
ABO/RH(D): A POS
Antibody Screen: NEGATIVE

## 2017-05-02 LAB — BLOOD CULTURE ID PANEL (REFLEXED)

## 2017-05-02 LAB — CBC WITH DIFFERENTIAL/PLATELET
Basophils Absolute: 0 10*3/uL (ref 0.0–0.1)
Basophils Relative: 0 %
Eosinophils Absolute: 0 10*3/uL (ref 0.0–0.7)
Eosinophils Relative: 0 %
HCT: 28.4 % — ABNORMAL LOW (ref 39.0–52.0)
Hemoglobin: 9.2 g/dL — ABNORMAL LOW (ref 13.0–17.0)
Lymphocytes Relative: 5 %
Lymphs Abs: 0.4 10*3/uL — ABNORMAL LOW (ref 0.7–4.0)
MCH: 28.9 pg (ref 26.0–34.0)
MCHC: 32.4 g/dL (ref 30.0–36.0)
MCV: 89.3 fL (ref 78.0–100.0)
Monocytes Absolute: 0.1 10*3/uL (ref 0.1–1.0)
Monocytes Relative: 2 %
Neutro Abs: 7.4 10*3/uL (ref 1.7–7.7)
Neutrophils Relative %: 93 %
Platelets: 336 10*3/uL (ref 150–400)
RBC: 3.18 MIL/uL — ABNORMAL LOW (ref 4.22–5.81)
RDW: 15.7 % — ABNORMAL HIGH (ref 11.5–15.5)
WBC: 7.9 10*3/uL (ref 4.0–10.5)

## 2017-05-02 LAB — GLUCOSE, CAPILLARY
Glucose-Capillary: 125 mg/dL — ABNORMAL HIGH (ref 65–99)
Glucose-Capillary: 133 mg/dL — ABNORMAL HIGH (ref 65–99)

## 2017-05-02 LAB — IRON AND TIBC
Iron: 5 ug/dL — ABNORMAL LOW (ref 45–182)
Saturation Ratios: 2 % — ABNORMAL LOW (ref 17.9–39.5)
TIBC: 316 ug/dL (ref 250–450)
UIBC: 311 ug/dL

## 2017-05-02 LAB — INFLUENZA PANEL BY PCR (TYPE A & B)
Influenza A By PCR: NEGATIVE
Influenza B By PCR: NEGATIVE

## 2017-05-02 LAB — POC OCCULT BLOOD, ED: Fecal Occult Bld: NEGATIVE

## 2017-05-02 LAB — CBG MONITORING, ED
Glucose-Capillary: 150 mg/dL — ABNORMAL HIGH (ref 65–99)
Glucose-Capillary: 115 mg/dL — ABNORMAL HIGH (ref 65–99)
Glucose-Capillary: 121 mg/dL — ABNORMAL HIGH (ref 65–99)

## 2017-05-02 LAB — TROPONIN I
Troponin I: 0.05 ng/mL (ref <0.03)
Troponin I: 0.24 ng/mL (ref <0.03)
Troponin I: 0.16 ng/mL (ref <0.03)

## 2017-05-02 LAB — LACTIC ACID, PLASMA: Lactic Acid, Venous: 1.9 mmol/L (ref 0.5–1.9)

## 2017-05-02 LAB — FERRITIN: Ferritin: 134 ng/mL (ref 24–336)

## 2017-05-02 LAB — MRSA PCR SCREENING: MRSA by PCR: NEGATIVE

## 2017-05-02 LAB — MAGNESIUM: Magnesium: 1.3 mg/dL — ABNORMAL LOW (ref 1.7–2.4)

## 2017-05-02 LAB — I-STAT CG4 LACTIC ACID, ED: Lactic Acid, Venous: 1.23 mmol/L (ref 0.5–1.9)

## 2017-05-02 LAB — VITAMIN B12: Vitamin B-12: 147 pg/mL — ABNORMAL LOW (ref 180–914)

## 2017-05-02 LAB — FOLATE: Folate: 19.1 ng/mL (ref >5.9)

## 2017-05-02 MED ORDER — ACETAMINOPHEN 325 MG PO TABS: 650.0000 mg | ORAL_TABLET | Freq: Four times a day (QID) | ORAL | Status: DC | PRN

## 2017-05-02 MED ORDER — INSULIN ASPART 100 UNIT/ML ~~LOC~~ SOLN
0.0000 [IU] | SUBCUTANEOUS | Status: DC
  Administered 2017-05-02 – 2017-05-03 (×5): 1 [IU] via SUBCUTANEOUS
  Administered 2017-05-03: 2 [IU] via SUBCUTANEOUS
  Administered 2017-05-03: 1 [IU] via SUBCUTANEOUS
  Administered 2017-05-03: 2 [IU] via SUBCUTANEOUS
  Administered 2017-05-03 – 2017-05-05 (×2): 1 [IU] via SUBCUTANEOUS
  Administered 2017-05-05: 2 [IU] via SUBCUTANEOUS
  Filled 2017-05-02: qty 1

## 2017-05-02 MED ORDER — HEPARIN SODIUM (PORCINE) 5000 UNIT/ML IJ SOLN
5000.0000 [IU] | Freq: Three times a day (TID) | INTRAMUSCULAR | Status: DC
  Administered 2017-05-02 – 2017-05-05 (×11): 5000 [IU] via SUBCUTANEOUS
  Filled 2017-05-02 (×13): qty 1

## 2017-05-02 MED ORDER — PANTOPRAZOLE SODIUM 40 MG PO TBEC
40.0000 mg | DELAYED_RELEASE_TABLET | Freq: Every day | ORAL | Status: DC
Start: 2017-05-02 — End: 2017-05-05
  Administered 2017-05-02 – 2017-05-05 (×4): 40 mg via ORAL
  Filled 2017-05-02 (×4): qty 1

## 2017-05-02 MED ORDER — SODIUM CHLORIDE 0.9 % IV SOLN
INTRAVENOUS | Status: DC
Start: 2017-05-02 — End: 2017-05-05
  Administered 2017-05-02 – 2017-05-03 (×2): via INTRAVENOUS

## 2017-05-02 MED ORDER — TIZANIDINE HCL 2 MG PO TABS
2.0000 mg | ORAL_TABLET | Freq: Four times a day (QID) | ORAL | Status: DC | PRN
  Filled 2017-05-02: qty 1

## 2017-05-02 MED ORDER — LORAZEPAM 2 MG/ML IJ SOLN
0.5000 mg | INTRAMUSCULAR | Status: DC | PRN
  Administered 2017-05-02: 0.5 mg via INTRAVENOUS

## 2017-05-02 MED ORDER — INSULIN ASPART 100 UNIT/ML ~~LOC~~ SOLN: 0.0000 [IU] | SUBCUTANEOUS | Status: DC

## 2017-05-02 MED ORDER — FINASTERIDE 5 MG PO TABS
5.0000 mg | ORAL_TABLET | Freq: Every day | ORAL | Status: DC
  Administered 2017-05-02 – 2017-05-05 (×4): 5 mg via ORAL
  Filled 2017-05-02 (×4): qty 1

## 2017-05-02 MED ORDER — SODIUM CHLORIDE 0.9% FLUSH
3.0000 mL | Freq: Two times a day (BID) | INTRAVENOUS | Status: DC
  Administered 2017-05-02 – 2017-05-05 (×5): 3 mL via INTRAVENOUS

## 2017-05-02 MED ORDER — MAGNESIUM OXIDE 250 MG PO TABS: 250.0000 mg | ORAL_TABLET | Freq: Every day | ORAL | Status: DC

## 2017-05-02 MED ORDER — PIPERACILLIN-TAZOBACTAM 3.375 G IVPB
3.3750 g | Freq: Three times a day (TID) | INTRAVENOUS | Status: DC
  Administered 2017-05-02: 3.375 g via INTRAVENOUS
  Filled 2017-05-02 (×3): qty 50

## 2017-05-02 MED ORDER — PIPERACILLIN-TAZOBACTAM 3.375 G IVPB 30 MIN
3.3750 g | Freq: Once | INTRAVENOUS | Status: AC
Start: 2017-05-02 — End: 2017-05-02
  Administered 2017-05-02: 3.375 g via INTRAVENOUS
  Filled 2017-05-02: qty 50

## 2017-05-02 MED ORDER — HEPARIN SODIUM (PORCINE) 5000 UNIT/ML IJ SOLN: 5000.0000 [IU] | Freq: Three times a day (TID) | INTRAMUSCULAR | Status: DC

## 2017-05-02 MED ORDER — PIPERACILLIN-TAZOBACTAM 3.375 G IVPB: 3.3750 g | Freq: Three times a day (TID) | INTRAVENOUS | Status: DC

## 2017-05-02 MED ORDER — B COMPLEX-C PO TABS
1.0000 | ORAL_TABLET | Freq: Every day | ORAL | Status: DC
  Administered 2017-05-02 – 2017-05-05 (×4): 1 via ORAL
  Filled 2017-05-02 (×4): qty 1

## 2017-05-02 MED ORDER — SODIUM CHLORIDE 0.9 % IV BOLUS (SEPSIS)
1000.0000 mL | Freq: Once | INTRAVENOUS | Status: AC
  Administered 2017-05-02: 1000 mL via INTRAVENOUS

## 2017-05-02 MED ORDER — SODIUM CHLORIDE 0.9 % IV BOLUS (SEPSIS)
500.0000 mL | Freq: Once | INTRAVENOUS | Status: AC
  Administered 2017-05-02: 500 mL via INTRAVENOUS

## 2017-05-02 MED ORDER — B COMPLEX PO TABS: 1.0000 | ORAL_TABLET | Freq: Every day | ORAL | Status: DC

## 2017-05-02 MED ORDER — LACTATED RINGERS IV BOLUS (SEPSIS)
1000.0000 mL | Freq: Once | INTRAVENOUS | Status: AC
  Administered 2017-05-02: 1000 mL via INTRAVENOUS

## 2017-05-02 MED ORDER — VANCOMYCIN HCL IN DEXTROSE 1-5 GM/200ML-% IV SOLN
1000.0000 mg | Freq: Once | INTRAVENOUS | Status: AC
  Administered 2017-05-02: 1000 mg via INTRAVENOUS
  Filled 2017-05-02: qty 200

## 2017-05-02 MED ORDER — ONDANSETRON HCL 4 MG PO TABS: 4.0000 mg | ORAL_TABLET | Freq: Four times a day (QID) | ORAL | Status: DC | PRN

## 2017-05-02 MED ORDER — CYANOCOBALAMIN 1000 MCG/ML IJ SOLN
1000.0000 ug | Freq: Every day | INTRAMUSCULAR | Status: DC
  Administered 2017-05-02 – 2017-05-05 (×4): 1000 ug via SUBCUTANEOUS
  Filled 2017-05-02 (×4): qty 1

## 2017-05-02 MED ORDER — POTASSIUM CHLORIDE IN NACL 20-0.9 MEQ/L-% IV SOLN: INTRAVENOUS | Status: DC

## 2017-05-02 MED ORDER — MORPHINE SULFATE (PF) 4 MG/ML IV SOLN: 1.0000 mg | INTRAVENOUS | Status: DC | PRN

## 2017-05-02 MED ORDER — ASPIRIN EC 325 MG PO TBEC
325.0000 mg | DELAYED_RELEASE_TABLET | Freq: Two times a day (BID) | ORAL | Status: DC
  Administered 2017-05-02 – 2017-05-05 (×7): 325 mg via ORAL
  Filled 2017-05-02 (×8): qty 1

## 2017-05-02 MED ORDER — VANCOMYCIN HCL 10 G IV SOLR
1250.0000 mg | INTRAVENOUS | Status: DC
  Administered 2017-05-03: 1250 mg via INTRAVENOUS
  Filled 2017-05-02: qty 1250

## 2017-05-02 MED ORDER — MORPHINE SULFATE (PF) 4 MG/ML IV SOLN
3.0000 mg | Freq: Once | INTRAVENOUS | Status: AC
  Administered 2017-05-02: 3 mg via INTRAVENOUS
  Filled 2017-05-02: qty 1

## 2017-05-02 MED ORDER — MAGNESIUM SULFATE 2 GM/50ML IV SOLN
2.0000 g | Freq: Once | INTRAVENOUS | Status: AC
  Administered 2017-05-02: 2 g via INTRAVENOUS
  Filled 2017-05-02: qty 50

## 2017-05-02 MED ORDER — ACETAMINOPHEN 650 MG RE SUPP: 650.0000 mg | Freq: Four times a day (QID) | RECTAL | Status: DC | PRN

## 2017-05-02 MED ORDER — FUROSEMIDE 10 MG/ML IJ SOLN
INTRAMUSCULAR | Status: AC
  Filled 2017-05-02: qty 4

## 2017-05-02 MED ORDER — SODIUM CHLORIDE 0.9 % IV SOLN
2.0000 g | INTRAVENOUS | Status: DC
  Administered 2017-05-02: 2 g via INTRAVENOUS
  Filled 2017-05-02: qty 2

## 2017-05-02 MED ORDER — CEFTRIAXONE SODIUM 2 G IJ SOLR
2.0000 g | INTRAMUSCULAR | Status: DC
  Administered 2017-05-02 – 2017-05-03 (×2): 2 g via INTRAVENOUS
  Filled 2017-05-02 (×3): qty 20

## 2017-05-02 MED ORDER — TAMSULOSIN HCL 0.4 MG PO CAPS
0.4000 mg | ORAL_CAPSULE | Freq: Every day | ORAL | Status: DC
  Administered 2017-05-02: 0.4 mg via ORAL
  Filled 2017-05-02: qty 1

## 2017-05-02 MED ORDER — HYDROCORTISONE NA SUCCINATE PF 100 MG IJ SOLR
50.0000 mg | Freq: Four times a day (QID) | INTRAMUSCULAR | Status: DC
Start: 2017-05-02 — End: 2017-05-03
  Administered 2017-05-02 – 2017-05-03 (×4): 50 mg via INTRAVENOUS
  Filled 2017-05-02: qty 1
  Filled 2017-05-02 (×3): qty 2
  Filled 2017-05-02 (×3): qty 1

## 2017-05-02 MED ORDER — SODIUM CHLORIDE 0.9 % IV BOLUS (SEPSIS)
1000.0000 mL | Freq: Once | INTRAVENOUS | Status: AC
Start: 2017-05-02 — End: 2017-05-02
  Administered 2017-05-02: 1000 mL via INTRAVENOUS

## 2017-05-02 MED ORDER — FUROSEMIDE 10 MG/ML IJ SOLN
40.0000 mg | Freq: Once | INTRAMUSCULAR | Status: AC
  Administered 2017-05-02: 40 mg via INTRAVENOUS

## 2017-05-02 MED ORDER — LORAZEPAM 2 MG/ML IJ SOLN
INTRAMUSCULAR | Status: AC
  Filled 2017-05-02: qty 1

## 2017-05-02 MED ORDER — ONDANSETRON HCL 4 MG/2ML IJ SOLN: 4.0000 mg | Freq: Four times a day (QID) | INTRAMUSCULAR | Status: DC | PRN

## 2017-05-02 MED ORDER — INSULIN ASPART 100 UNIT/ML ~~LOC~~ SOLN
SUBCUTANEOUS | Status: AC
  Filled 2017-05-02: qty 1

## 2017-05-02 MED ORDER — OMEGA-3-ACID ETHYL ESTERS 1 G PO CAPS
1.0000 g | ORAL_CAPSULE | Freq: Every day | ORAL | Status: DC
  Administered 2017-05-02 – 2017-05-05 (×4): 1 g via ORAL
  Filled 2017-05-02 (×4): qty 1

## 2017-05-02 MED ORDER — NOREPINEPHRINE BITARTRATE 1 MG/ML IV SOLN
0.0000 ug/min | INTRAVENOUS | Status: DC
  Administered 2017-05-02: 5 ug/min via INTRAVENOUS
  Filled 2017-05-02: qty 4

## 2017-05-02 NOTE — Progress Notes (Signed)
Pharmacy Antibiotic Note  [name] [name] is a 77 y.o. male admitted on 05/01/2017 with sepsis.  Pharmacy has been consulted for vancomycin and zosyn dosing.  Plan: Continue vancomycin 1,250mg  IV q24h Stop cefepime Start Zosyn 3.375 gm IV q8h (4 hour infusion) Monitor clinical picture, renal function, VT prn F/U C&S, abx deescalation / LOT  Height: 5\' 11"  (180.3 cm) Weight: 258 lb (117 kg) IBW/kg (Calculated) : 75.3  Temp (24hrs), Avg:98.5 F (36.9 C), Min:98.5 F (36.9 C), Max:98.5 F (36.9 C)  Recent Labs  Lab 05/01/17 2230 05/01/17 2253 05/02/17 0057 05/02/17 0334  WBC 20.6*  --   --  7.9  CREATININE 2.23*  --   --  2.01*  LATICACIDVEN  --  2.07* 1.23 1.9    Estimated Creatinine Clearance: 42 mL/min (A) (by C-G formula based on SCr of 2.01 mg/dL (H)).    Allergies  Allergen Reactions  . Iodinated Diagnostic Agents Shortness Of Breath  . Iohexol Shortness Of Breath  . Ace Inhibitors Cough   Thank you for allowing pharmacy to be a part of this patient's care.  Elenor Quinones, PharmD, BCPS Clinical Pharmacist Pager [phone] 05/02/2017 12:57 PM

## 2017-05-02 NOTE — ED Notes (Signed)
Dr. Myna Hidalgo notified on pt.'s elevated Troponin .

## 2017-05-02 NOTE — ED Notes (Signed)
Dr. Myna Hidalgo notified on pt.'s tachycardia/tachypnea and hypertension .

## 2017-05-02 NOTE — ED Notes (Signed)
Dr. Ashok Cordia notified on pt.'s persistent hypotension , he ordered foley catheter and urinalysis .

## 2017-05-02 NOTE — Progress Notes (Signed)
PHARMACY - PHYSICIAN COMMUNICATION CRITICAL VALUE ALERT - BLOOD CULTURE IDENTIFICATION (BCID)  [name] [name] is an 77 y.o. male who presented to [facility] on 05/01/2017 with a chief complaint of weakness.  Assessment:  Reported blood cx 1 of 4 samples growing Ecoli, possible urinary source.  Name of physician (or Provider) Contacted: Dr. Johnette Abraham [place], E-link  Current antibiotics: Vancomycin and Zosyn  Changes to prescribed antibiotics recommended:  Recommend narrowing therapy to Rocephin 2gm IV q24h.  MD agreed to stop Zosyn and change to Rocephin.  Wanted to keep Vancomycin for now, pending further culture data.    Results for orders placed or performed during the hospital encounter of 05/01/17  Blood Culture ID Panel (Reflexed) (Collected: 05/02/2017 12:45 AM)  Result Value Ref Range   Enterococcus species NOT DETECTED NOT DETECTED   Listeria monocytogenes NOT DETECTED NOT DETECTED   Staphylococcus species NOT DETECTED NOT DETECTED   Staphylococcus aureus NOT DETECTED NOT DETECTED   Streptococcus species NOT DETECTED NOT DETECTED   Streptococcus agalactiae NOT DETECTED NOT DETECTED   Streptococcus pneumoniae NOT DETECTED NOT DETECTED   Streptococcus pyogenes NOT DETECTED NOT DETECTED   Acinetobacter baumannii NOT DETECTED NOT DETECTED   Enterobacteriaceae species DETECTED (A) NOT DETECTED   Enterobacter cloacae complex NOT DETECTED NOT DETECTED   Escherichia coli DETECTED (A) NOT DETECTED   Klebsiella oxytoca NOT DETECTED NOT DETECTED   Klebsiella pneumoniae NOT DETECTED NOT DETECTED   Proteus species NOT DETECTED NOT DETECTED   Serratia marcescens NOT DETECTED NOT DETECTED   Carbapenem resistance NOT DETECTED NOT DETECTED   Haemophilus influenzae NOT DETECTED NOT DETECTED   Neisseria meningitidis NOT DETECTED NOT DETECTED   Pseudomonas aeruginosa NOT DETECTED NOT DETECTED   Candida albicans NOT DETECTED NOT DETECTED   Candida glabrata NOT DETECTED NOT DETECTED   Candida krusei  NOT DETECTED NOT DETECTED   Candida parapsilosis NOT DETECTED NOT DETECTED   Candida tropicalis NOT DETECTED NOT DETECTED    [name], [name] 05/02/2017  8:45 PM

## 2017-05-02 NOTE — ED Notes (Signed)
Patient transported to Ultrasound 

## 2017-05-02 NOTE — Progress Notes (Signed)
  Echocardiogram 2D Echocardiogram has been performed.  Shahzaib Azevedo T Mitzy Naron 05/02/2017, 7:40 PM

## 2017-05-02 NOTE — Progress Notes (Addendum)
Patient ID: [name] [name], male   DOB: 04/16/1940, 77 y.o.   MRN: [id number] Patient was admitted early this morning with generalized weakness and malaise.  He was found to be hypotensive with acute kidney injury and UTI.  He was started on intravenous fluids and broad-spectrum antibiotics.  Patient seen and examined at bedside.  Plan of care discussed with the patient.  This morning's H&P and his medical records were reviewed by myself.  His left hip surgical incision looks clean without any signs of infection.  He had a bowel movement last night apparently.  His blood pressure early this morning was in the 70s-80s.  I have ordered 500 cc bolus of normal saline and normal saline to be continued at 125 cc an hour.  Systolic blood pressure at the time of my examination is in the 90s.  He is currently on room air and does not have much tachypnea.  Will get abdominal x-ray and left hip x-ray.  Bilateral lower extremity ultrasound to rule out DVT as there is some swelling of the lower extremity.  Repeat a.m. labs.  Will start on a diet if abdominal x-ray looks okay.  Addendum: Blood pressure in the 80s despite having received more than 4.5 L IV fluids.  Spoke to the intensivist Dr. Milon Dikes who will evaluate the patient and decide if the patient is to go to ICU on pressors.    Addendum: Patient is being transferred to ICU on pressors.  Patient will be transferred to intensivist's service.  We will sign off for now.

## 2017-05-02 NOTE — Progress Notes (Signed)
Pharmacy Antibiotic Note  [name] [name] is a 77 y.o. male admitted on 05/01/2017 with sepsis.  Pharmacy has been consulted for vancomycin and zosyn dosing.  Hypotensive on admit, afebrile, wbc elevated at 20 and lactic acid elevated at 2.   Plan: Vancomycin 2g now then 1250mg   IV every 24 hours.  Goal trough 15-20 mcg/mL.  Continue with zosyn 3.375g IV q8 hours  Height: 5\' 11"  (180.3 cm) Weight: 258 lb (117 kg) IBW/kg (Calculated) : 75.3  Temp (24hrs), Avg:98.5 F (36.9 C), Min:98.5 F (36.9 C), Max:98.5 F (36.9 C)  Recent Labs  Lab 05/01/17 2230 05/01/17 2253  WBC 20.6*  --   CREATININE 2.23*  --   LATICACIDVEN  --  2.07*    Estimated Creatinine Clearance: 37.8 mL/min (A) (by C-G formula based on SCr of 2.23 mg/dL (H)).    Allergies  Allergen Reactions  . Iohexol Shortness Of Breath  . Ace Inhibitors Cough   Thank you for allowing pharmacy to be a part of this patient's care.  Erin Hearing PharmD., BCPS Clinical Pharmacist 05/02/2017 12:42 AM

## 2017-05-02 NOTE — Consult Note (Signed)
PULMONARY / CRITICAL CARE MEDICINE   Name: [name] [name] MRN: [id number] DOB: 07/24/1940    ADMISSION DATE:  05/01/2017 CONSULTATION DATE: May 02, 2017  REFERRING MD: ED physician hospitalist team  CHIEF COMPLAINT: Weakness  HISTORY OF PRESENT ILLNESS:   This 77 year old male with past medical history of chronic kidney disease stage III recent left hip fracture ORIF on February 15 who is now in the ED with feeling weak he was at rehab and home in the ED patient was diagnosed with UTI given his pyuria and a urinalysis With no other source patient was improving but then he became very hypotensive even after 4 AM liters of crystalloid resuscitation patient is denying fever chills rigors nausea vomiting diarrhea is just feeling weak and dizzy when he stands up he has no shortness of breath still he got the fluids taking 100% on 2 L does not seem to be tachypneic no swelling in his legs. Daughter is by the bedside she gave consent to the central line.  PAST MEDICAL HISTORY :  He  has a past medical history of Arthritis, ED (erectile dysfunction), Enlarged prostate with lower urinary tract symptoms (LUTS), GERD (gastroesophageal reflux disease), History of chronic gastritis, History of colon polyps, History of esophageal stricture, History of kidney stones, History of kidney stones, History of primary hyperparathyroidism, Hyperlipidemia, Hypertension, Left ureteral stone, Nephrolithiasis, and Pre-diabetes.  PAST SURGICAL HISTORY: He  has a past surgical history that includes tumor ear (Left, age 68); Cystoscopy w/ ureteroscopy w/ lithotripsy (Left, 05/04/2005); Colonoscopy (02/17/2005); Nephrolithotomy (Left, 02/01/2014); Parathyroidectomy (Left, 07/10/2014); CYSTO/  LEFT RETROGRADE PYELOGRAM/ STENT PLACEMENT (01/26/2005); Esophagogastroduodenoscopy (egd) with esophageal dilation (x4  last one 07-21-2010); Inguinal hernia repair (Left, yrs ago); Cystoscopy/retrograde/ureteroscopy/stone extraction with  basket (Left, 12/30/2015); Cystoscopy with stent placement (Left, 12/30/2015); Holmium laser application (Left, 40/98/1191); Cystoscopy/ureteroscopy/holmium laser/stent placement (Left, 03/09/2016); Total hip arthroplasty (Left, 06/24/2016); Total hip revision (Left, 11/18/2016); and ORIF hip fracture (Left, 04/16/2017).  Allergies  Allergen Reactions  . Iodinated Diagnostic Agents Shortness Of Breath  . Iohexol Shortness Of Breath  . Ace Inhibitors Cough    No current facility-administered medications on file prior to encounter.    Current Outpatient Medications on File Prior to Encounter  Medication Sig  . aspirin EC 325 MG tablet Take 1 tablet (325 mg total) by mouth 2 (two) times daily.  Marland Kitchen b complex vitamins tablet Take 1 tablet by mouth daily.  . bisoprolol-hydrochlorothiazide (ZIAC) 5-6.25 MG tablet TAKE 1 TABLET BY MOUTH EVERY MORNING FOR BLOOD PRESSURE  . Cholecalciferol (VITAMIN D PO) Take 1 tablet by mouth 2 (two) times daily.   . fenofibrate (TRICOR) 145 MG tablet TAKE 1 TABLET BY MOUTH DAILY FOR TRIGLYCERIDES/BLOOD FATS  . finasteride (PROSCAR) 5 MG tablet Take 5 mg by mouth daily.  . Flaxseed, Linseed, (FLAX SEEDS PO) Take 1,000 mg by mouth daily.   Marland Kitchen ibuprofen (ADVIL,MOTRIN) 200 MG tablet Take 600 mg by mouth every 6 (six) hours as needed for fever or moderate pain.  . Magnesium Oxide 250 MG TABS Take 500 mg by mouth daily.   . Omega-3 Fatty Acids (FISH OIL) 1200 MG CAPS Take 1 capsule by mouth daily.   Marland Kitchen omeprazole (PRILOSEC) 40 MG capsule Take 40 mg by mouth daily.  . tamsulosin (FLOMAX) 0.4 MG CAPS capsule Take 0.4 mg by mouth daily after breakfast.   . tiZANidine (ZANAFLEX) 2 MG tablet Take 1 tablet (2 mg total) by mouth every 6 (six) hours as needed for muscle spasms.  Marland Kitchen gabapentin (  NEURONTIN) 300 MG capsule Take 300 mg by mouth 2 (two) times daily.  Marland Kitchen losartan (COZAAR) 100 MG tablet TAKE 1 TABLET BY MOUTH DAILY FOR BLOOD PRESSURE.  Marland Kitchen oxyCODONE-acetaminophen  (PERCOCET/ROXICET) 5-325 MG tablet Take 1 tablet by mouth every 4 (four) hours as needed for severe pain. (Patient not taking: Reported on 05/02/2017)    FAMILY HISTORY:  His indicated that his mother is deceased. He indicated that his father is deceased. He indicated that his sister is alive. He indicated that his brother is alive.   SOCIAL HISTORY: He  reports that he quit smoking about 43 years ago. He quit after 8.00 years of use. he has never used smokeless tobacco. He reports that he drinks about 0.6 - 1.2 oz of alcohol per week. He reports that he does not use drugs.  REVIEW OF SYSTEMS:   14 points reviewed with mentioned HPI  SUBJECTIVE:  Started feeling shortness of breath of 25 L fluid crystalloid resuscitation.  VITAL SIGNS: BP 96/72   Pulse 71   Temp 98.5 F (36.9 C) (Oral)   Resp 18   Ht 5\' 11"  (1.803 m)   Wt 258 lb (117 kg)   SpO2 100%   BMI 35.98 kg/m   HEMODYNAMICS:    VENTILATOR SETTINGS:    INTAKE / OUTPUT: I/O last 3 completed shifts: In: 1610 [IV Piggyback:4450] Out: 1000 [Urine:1000]  PHYSICAL EXAMINATION: General:  NAD , pleasant and hungry , wants to eat . Neuro:  WNL , AOX3 , EOMI , CN II-XII intact , UL , LL strength is symmetrical and 5/5 HEENT:  atraumatic , no jaundice , dry mucous membranes  Cardiovascular:  Irregular irregular , ESM 2/6 in the aortic area  Lungs:  CTA bilateral , no wheezing or crackles  Abdomen:  Soft lax +BS , no tenderness . Musculoskeletal:  WNL , normal pulses  Skin:  No rash    LABS:  BMET Recent Labs  Lab 05/01/17 2230 05/02/17 0334  NA 129* 128*  K 3.4* 3.7  CL 98* 101  CO2 20* 16*  BUN 30* 28*  CREATININE 2.23* 2.01*  GLUCOSE 160* 139*    Electrolytes Recent Labs  Lab 05/01/17 2230 05/02/17 0334  CALCIUM 8.3* 7.6*  MG  --  1.3*    CBC Recent Labs  Lab 05/01/17 2230 05/02/17 0334  WBC 20.6* 7.9  HGB 8.1* 9.2*  HCT 25.0* 28.4*  PLT 430* 336    Coag's No results for input(s):  APTT, INR in the last 168 hours.  Sepsis Markers Recent Labs  Lab 05/01/17 2253 05/02/17 0057 05/02/17 0334  LATICACIDVEN 2.07* 1.23 1.9    ABG No results for input(s): PHART, PCO2ART, PO2ART in the last 168 hours.  Liver Enzymes No results for input(s): AST, ALT, ALKPHOS, BILITOT, ALBUMIN in the last 168 hours.  Cardiac Enzymes Recent Labs  Lab 05/02/17 0442  TROPONINI 0.05*    Glucose Recent Labs  Lab 05/01/17 2242 05/02/17 0412 05/02/17 0819 05/02/17 1309  GLUCAP 164* 150* 115* 121*    Imaging Dg Abd 1 View  Result Date: 05/02/2017 CLINICAL DATA:  Abdominal distension, follow-up ileus EXAM: ABDOMEN - 1 VIEW COMPARISON:  05/01/2017 FINDINGS: Mild gaseous distension of small bowel and colon. There is no bowel dilatation to suggest obstruction. There is no evidence of pneumoperitoneum, portal venous gas or pneumatosis. There are no pathologic calcifications along the expected course of the ureters. The osseous structures are unremarkable. IMPRESSION: Mild gaseous distention of small bowel and colon likely  reflecting resolving ileus. Electronically Signed   By: Kathreen Devoid   On: 05/02/2017 11:21   US Renal  Result Date: 05/02/2017 CLINICAL DATA:  Sepsis, acute kidney injury. EXAM: RENAL / URINARY TRACT ULTRASOUND COMPLETE COMPARISON:  CT 02/21/2016 FINDINGS: Technically limited exam due to habitus and uncontrolled shaking. Right Kidney: Length: 11.3 cm. Mild increased renal echogenicity. No mass or hydronephrosis visualized. Left Kidney: Length: 11.2 cm. Mild increased renal echogenicity. 4 cm cyst laterally. Additional cyst in the upper left kidney on prior CT is not well seen sonographically. No shadowing calculi. No solid mass or hydronephrosis visualized. Bladder: Nondistended and not well evaluated, patient recently voided. Incidental note of gallstone within the gallbladder. IMPRESSION: 1. No hydronephrosis or obstructive uropathy. 2. Mild increased renal echogenicity  suggesting chronic medical renal disease. 3. Left renal cyst. Electronically Signed   By: Jeb Levering M.D.   On: 05/02/2017 02:51   Dg Chest Port 1 View  Result Date: 05/02/2017 CLINICAL DATA:  Encounter for central line placement. EXAM: PORTABLE CHEST 1 VIEW COMPARISON:  05/02/2017 at 4:02 a.m. FINDINGS: Right internal jugular central venous line tip projects in the lower superior vena cava. No pneumothorax. Appearance of the lungs is without change from the earlier exam allowing for differences in lung volume and patient positioning. IMPRESSION: 1. New right internal jugular central venous line tip projects in the lower superior vena cava. No pneumothorax. 2. No other change from the earlier study. Electronically Signed   By: Lajean Manes M.D.   On: 05/02/2017 13:35   Dg Chest Portable 1 View  Result Date: 05/02/2017 CLINICAL DATA:  Increasing shortness of breath. EXAM: PORTABLE CHEST 1 VIEW COMPARISON:  Chest radiograph yesterday. FINDINGS: Lower lung volumes from prior exam leading to bronchovascular crowding, limiting pulmonary vascular assessment. Unchanged mediastinal contours. Heart size upper normal. Streaky bibasilar atelectasis, increasing. No confluent consolidation. No pleural effusion or pneumothorax. IMPRESSION: Low lung volumes with bronchovascular crowding and developing bibasilar atelectasis. Developing vascular congestion is considered, however low lung volumes limits pulmonary vasculature assessment. Prominent heart size likely accentuated by technique. Electronically Signed   By: Jeb Levering M.D.   On: 05/02/2017 04:40   Dg Abd Acute W/chest  Result Date: 05/01/2017 CLINICAL DATA:  Constipation.  Recent left hip surgery. EXAM: DG ABDOMEN ACUTE W/ 1V CHEST COMPARISON:  Chest radiograph 04/12/2017 FINDINGS: The cardiomediastinal contours are normal. The lungs are clear. There is no free intra-abdominal air. Air-filled nondistended transverse, descending, and sigmoid colon. No  significant formed stool. Few prominent small bowel loops in the left abdomen with air-fluid levels. No radiopaque calculi. No acute osseous abnormalities are seen. Left hip arthroplasty partially included. IMPRESSION: 1. Air-filled nondistended colon with a few prominent small bowel loops in the left abdomen, suggesting generalized ileus. No increased stool burden to suggest constipation. No free air. 2.  No acute pulmonary process. Electronically Signed   By: Jeb Levering M.D.   On: 05/01/2017 23:52   Dg Hip Unilat With Pelvis 2-3 Views Left  Result Date: 05/02/2017 CLINICAL DATA:  Left hip replacement EXAM: DG HIP (WITH OR WITHOUT PELVIS) 2-3V LEFT COMPARISON:  04/16/2017 FINDINGS: Left total hip arthroplasty without failure or complication. Lateral sideplate and interlocking cerclage wires are present. No fracture or dislocation. Mild osteoarthritis of the right hip. IMPRESSION: 1. Left hip arthroplasty without failure or complication. Electronically Signed   By: Kathreen Devoid   On: 05/02/2017 11:23        LINES/TUBES: Central line right IJ  DISCUSSION: 76  with severe septic shock on pressors  ASSESSMENT / PLAN:  PULMONARY Vascular congestion pulmonary edema after 5 L of crystalloid resuscitation.  CARDIOVASCULAR Severe shock from hypertensive hypokalemia hold all anti-blood pressure medications.  RENAL Acute kidney injury most likely from diuretics hyponatremia most likely from HCTZ stop all of avoid nephrotoxic agents.  GASTROINTESTINAL N.p.o. until the course is more clarified and detailed  HEMATOLOGIC Severe vitamin B12 deficiency start replacement.  INFECTIOUS Start the patient with broad-spectrum antibiotic for sepsis most likely from UTI source of septic shock  ENDOCRINE Start the patient on vitamin B12 for vitamin B12 deficiency severe Sliding scale insulin for history of diabetes  NEUROLOGIC Hold all sedatives while he is hypotensive  FAMILY  Daughter is by  the bedside she gave the consent for the procedure  Critical care time 45 minutes starting the bolus Pressors for critically ill severely hypotensive and severe septic and hypotensive shock.  Pulmonary and [place] Pager: [phone]  05/02/2017, 1:53 PM

## 2017-05-02 NOTE — ED Notes (Signed)
Updated patients family with pt permission. Pt noted to have decreasing blood pressure again at this time. MD paged, waiting for response.

## 2017-05-02 NOTE — Procedures (Signed)
Name:  [name] [name] MRN:  [id number] DOB:  1940-11-01  PROCEDURE NOTE  Procedure:  Central venous catheter placement.  Indications:  Need for intravenous access and hemodynamic monitoring.  Consent:  Consent was implied due to the emergency nature of the procedure.  Anesthesia:  A total of 10 mL of 1% Lidocaine was used for local infiltration anesthesia.  Procedure summary:  Appropriate equipment was assembled.  The patient was identified as [name] [name] and safety timeout was performed. The patient was placed in Trendelenburg position.  Sterile technique was used. The patient's right anterior chest wall was prepped using chlorhexidine / alcohol scrub and the field was draped in usual sterile fashion with full body drape. After the adequate anesthesia was achieved, the RIJ vein was cannulated with the introducer needle without difficulty. A guide wire was advanced through the introducer needle, which was then withdrawn. A small skin incision was made at the point of wire entry, the dilator was inserted over the guide wire and appropriate dilation was obtained. The dilator was removed and  triple-lumen catheter was advanced over the guide wire, which was then removed.  All ports were aspirated and flushed with normal saline without difficulty. The catheter was secured into place at 18 cm. Antibiotic patch was placed and sterile dressing was applied. Post-procedure chest x-ray was ordered.  Complications:  No immediate complications were noted.  Hemodynamic parameters and oxygenation remained stable throughout the procedure.  Estimated blood loss:  Less then 5 mL.  Etheleen Nicks, MD Pulmonary and [place] Cell: [phone]  05/02/2017, 1:16 PM

## 2017-05-02 NOTE — H&P (Signed)
History and Physical    [name] [name]:[id number] DOB: [birth year] DOA: 05/01/2017  PCP: Unk Pinto, MD   Patient coming from: Home  Chief Complaint: Gen weakness, malaise   HPI: [name] [name] is a 77 y.o. male with medical history significant for type 2 diabetes mellitus, hypertension, chronic kidney disease stage III, and a recent left greater trochanteric fracture status post ORIF on 04/16/2017, now presenting to the emergency department for evaluation of generalized weakness and malaise.  Patient reports that he has had some abdominal distention and discomfort following his surgery and has had a poor appetite associated with this.  He was otherwise well until yesterday when he developed generalized weakness and malaise.  He reports a subjective fever as well.  Symptoms continue to progress overnight and he eventually called EMS.  He denies chest pain, headache, change in vision or hearing, or focal numbness or weakness.  ED Course: Upon arrival to the ED, patient is found to be afebrile, saturating well on room air,  with blood pressure of 82/56, and normal heart rate.  Chest x-ray is negative for acute cardiopulmonary disease, KUB features air filled nondistended colon consistent with ileus, and urinalysis is suggestive of infection.  Chemistry panel reveals a sodium of 129, potassium 3.4, and creatinine of 2.73, up from 1.36 two weeks earlier.  CBC is notable for leukocytosis to 20,600 and a normocytic anemia with hemoglobin of 8.1, down from 9.9 two weeks ago.  Lactic acid is slightly elevated to 2.07 and troponin is within the normal limits.  Blood and urine cultures were collected, 2 L of normal saline was administered, and the patient was started on vancomycin and Zosyn.  Blood pressure improved, he is developing rigors, and will be admitted to the stepdown unit for ongoing evaluation and treatment of sepsis secondary to UTI with acute kidney injury superimposed on chronic kidney disease  stage III.  Review of Systems:  All other systems reviewed and apart from HPI, are negative.  Past Medical History:  Diagnosis Date  . Arthritis    in left hip  . ED (erectile dysfunction)   . Enlarged prostate with lower urinary tract symptoms (LUTS)   . GERD (gastroesophageal reflux disease)   . History of chronic gastritis   . History of colon polyps    hyperplastic 2006  . History of esophageal stricture    S/P  DILATATION 2009; 2010; 2010 2012  . History of kidney stones    hx. multiple kidney stones  . History of kidney stones   . History of primary hyperparathyroidism    s/p  left superior parathyroidectomy 07-10-2014  . Hyperlipidemia   . Hypertension   . Left ureteral stone   . Nephrolithiasis    left side   . Pre-diabetes     Past Surgical History:  Procedure Laterality Date  . COLONOSCOPY  02/17/2005  . CYSTO/  LEFT RETROGRADE PYELOGRAM/ STENT PLACEMENT  01/26/2005  . CYSTOSCOPY W/ URETEROSCOPY W/ LITHOTRIPSY Left 05/04/2005  . CYSTOSCOPY WITH STENT PLACEMENT Left 12/30/2015   Procedure: CYSTOSCOPY WITH STENT PLACEMENT;  Surgeon: Franchot Gallo, MD;  Location: [facility];  Service: Urology;  Laterality: Left;  . CYSTOSCOPY/RETROGRADE/URETEROSCOPY/STONE EXTRACTION WITH BASKET Left 12/30/2015   Procedure: CYSTOSCOPY/RETROGRADE/URETEROSCOPY/STONE EXTRACTION WITH BASKET;  Surgeon: Franchot Gallo, MD;  Location: [facility];  Service: Urology;  Laterality: Left;  . CYSTOSCOPY/URETEROSCOPY/HOLMIUM LASER/STENT PLACEMENT Left 03/09/2016   Procedure: CYSTOSCOPY/RETROGRADE PYELOGRAM/URETEROSCOPY/BASKET STONE EXTRACTION/STENT PLACEMENT;  Surgeon: Franchot Gallo, MD;  Location: [facility];  Service: Urology;  Laterality: Left;  . ESOPHAGOGASTRODUODENOSCOPY (EGD) WITH ESOPHAGEAL DILATION  x4  last one 07-21-2010  . HOLMIUM LASER APPLICATION Left 31/54/0086   Procedure: HOLMIUM LASER APPLICATION;  Surgeon: Franchot Gallo, MD;  Location:  [facility];  Service: Urology;  Laterality: Left;  . INGUINAL HERNIA REPAIR Left yrs ago  . NEPHROLITHOTOMY Left 02/01/2014   Procedure: NEPHROLITHOTOMY PERCUTANEOUS;  Surgeon: Jorja Loa, MD;  Location: [facility];  Service: Urology;  Laterality: Left;  . ORIF HIP FRACTURE Left 04/16/2017   Procedure: OPEN REDUCTION INTERNAL FIXATION HIP GREATER TROCHANTER;  Surgeon: Frederik Pear, MD;  Location: [facility];  Service: Orthopedics;  Laterality: Left;  . PARATHYROIDECTOMY Left 07/10/2014   Procedure: LEFT SUPERIOR PARATHYROIDECTOMY;  Surgeon: Armandina Gemma, MD;  Location: [place];  Service: General;  Laterality: Left;  . TOTAL HIP ARTHROPLASTY Left 06/24/2016   Procedure: LEFT TOTAL HIP ARTHROPLASTY ANTERIOR APPROACH;  Surgeon: Gaynelle Arabian, MD;  Location: [facility];  Service: Orthopedics;  Laterality: Left;  . TOTAL HIP REVISION Left 11/18/2016   Procedure: Left femoral revision - posterior approach;  Surgeon: Gaynelle Arabian, MD;  Location: [facility];  Service: Orthopedics;  Laterality: Left;  . tumor ear Left age 42    topical growth behind left ear.     reports that he quit smoking about 43 years ago. He quit after 8.00 years of use. he has never used smokeless tobacco. He reports that he drinks about 0.6 - 1.2 oz of alcohol per week. He reports that he does not use drugs.  Allergies  Allergen Reactions  . Iohexol Shortness Of Breath  . Ace Inhibitors Cough    Family History  Problem Relation Age of Onset  . Heart disease Mother   . Alzheimer's disease Mother   . Heart disease Father   . Bladder Cancer Sister      Prior to Admission medications   Medication Sig Start Date End Date Taking? Authorizing Provider  aspirin EC 325 MG tablet Take 1 tablet (325 mg total) by mouth 2 (two) times daily. 04/16/17   Leighton Parody, PA-C  b complex vitamins tablet Take 1 tablet by mouth daily.    [provider]  bisoprolol-hydrochlorothiazide (ZIAC) 5-6.25 MG tablet TAKE 1 TABLET  BY MOUTH EVERY MORNING FOR BLOOD PRESSURE 01/25/17   Liane Comber, NP  Cholecalciferol (VITAMIN D PO) Take 4,000-6,000 Units by mouth See admin instructions. 6000 iu in the morning, and 4000 iu in the evening    [provider]  fenofibrate (TRICOR) 145 MG tablet TAKE 1 TABLET BY MOUTH DAILY FOR TRIGLYCERIDES/BLOOD FATS 06/01/16   Unk Pinto, MD  finasteride (PROSCAR) 5 MG tablet Take 5 mg by mouth daily.    [provider]  Flaxseed, Linseed, (FLAX SEEDS PO) Take 1,000 mg by mouth daily.     [provider]  gabapentin (NEURONTIN) 300 MG capsule Take 300 mg by mouth 2 (two) times daily.    [provider]  losartan (COZAAR) 100 MG tablet TAKE 1 TABLET BY MOUTH DAILY FOR BLOOD PRESSURE. 03/22/17   Liane Comber, NP  Magnesium Oxide 250 MG TABS Take 250 mg by mouth daily.     [provider]  Omega-3 Fatty Acids (FISH OIL) 1200 MG CAPS Take 1 capsule by mouth daily.     [provider]  omeprazole (PRILOSEC) 40 MG capsule Take 40 mg by mouth daily.    [provider]  oxyCODONE-acetaminophen (PERCOCET/ROXICET) 5-325 MG tablet Take 1 tablet by  mouth every 4 (four) hours as needed for severe pain. 04/16/17   Leighton Parody, PA-C  tamsulosin (FLOMAX) 0.4 MG CAPS capsule Take 0.4 mg by mouth daily after breakfast.  11/06/14   [provider]  tiZANidine (ZANAFLEX) 2 MG tablet Take 1 tablet (2 mg total) by mouth every 6 (six) hours as needed for muscle spasms. 04/16/17   Leighton Parody, PA-C    Physical Exam: Vitals:   05/02/17 0129 05/02/17 0130 05/02/17 0145 05/02/17 0200  BP: 109/69 120/69 102/72 (!) 138/91  Pulse: 88 86 88 (!) 101  Resp: 16 (!) 21 (!) 21 (!) 25  Temp:      TempSrc:      SpO2: 100% 100% 100% 100%  Weight:      Height:          Constitutional: NAD, calm. Rigors, in obvious discomfort Eyes: PERTLA, lids and conjunctivae normal ENMT: Mucous membranes are moist. Posterior pharynx clear of any  exudate or lesions.   Neck: normal, supple, no masses, no thyromegaly Respiratory: clear to auscultation bilaterally, no wheezing, no crackles. Normal respiratory effort.   Cardiovascular: S1 & S2 heard, regular rate and rhythm. No significant JVD. Abdomen: Distended but soft. Non-tender. Bowel sounds appreciated.  Musculoskeletal: no clubbing / cyanosis. No joint deformity upper and lower extremities.   Skin: no significant rashes, lesions, ulcers. Warm, dry, well-perfused. Neurologic: CN 2-12 grossly intact. Sensation intact. Strength 5/5 in all 4 limbs.  Psychiatric:  Alert and oriented x 3. Pleasant, cooperative.     Labs on Admission: I have personally reviewed following labs and imaging studies  CBC: Recent Labs  Lab 05/01/17 2230  WBC 20.6*  NEUTROABS 18.4*  HGB 8.1*  HCT 25.0*  MCV 88.7  PLT 756*   Basic Metabolic Panel: Recent Labs  Lab 05/01/17 2230  NA 129*  K 3.4*  CL 98*  CO2 20*  GLUCOSE 160*  BUN 30*  CREATININE 2.23*  CALCIUM 8.3*   GFR: Estimated Creatinine Clearance: 37.8 mL/min (A) (by C-G formula based on SCr of 2.23 mg/dL (H)). Liver Function Tests: No results for input(s): AST, ALT, ALKPHOS, BILITOT, PROT, ALBUMIN in the last 168 hours. No results for input(s): LIPASE, AMYLASE in the last 168 hours. No results for input(s): AMMONIA in the last 168 hours. Coagulation Profile: No results for input(s): INR, PROTIME in the last 168 hours. Cardiac Enzymes: No results for input(s): CKTOTAL, CKMB, CKMBINDEX, TROPONINI in the last 168 hours. BNP (last 3 results) No results for input(s): PROBNP in the last 8760 hours. HbA1C: No results for input(s): HGBA1C in the last 72 hours. CBG: Recent Labs  Lab 05/01/17 2242  GLUCAP 164*   Lipid Profile: No results for input(s): CHOL, HDL, LDLCALC, TRIG, CHOLHDL, LDLDIRECT in the last 72 hours. Thyroid Function Tests: No results for input(s): TSH, T4TOTAL, FREET4, T3FREE, THYROIDAB in the last 72  hours. Anemia Panel: No results for input(s): VITAMINB12, FOLATE, FERRITIN, TIBC, IRON, RETICCTPCT in the last 72 hours. Urine analysis:    Component Value Date/Time   COLORURINE AMBER (A) 05/02/2017 0046   APPEARANCEUR CLOUDY (A) 05/02/2017 0046   LABSPEC 1.017 05/02/2017 0046   PHURINE 5.0 05/02/2017 0046   GLUCOSEU NEGATIVE 05/02/2017 0046   HGBUR MODERATE (A) 05/02/2017 0046   BILIRUBINUR NEGATIVE 05/02/2017 0046   KETONESUR NEGATIVE 05/02/2017 0046   PROTEINUR 100 (A) 05/02/2017 0046   NITRITE POSITIVE (A) 05/02/2017 0046   LEUKOCYTESUR LARGE (A) 05/02/2017 0046   Sepsis Labs: @LABRCNTIP (procalcitonin:4,lacticidven:4) )No results found  for this or any previous visit (from the past 240 hour(s)).   Radiological Exams on Admission: Dg Abd Acute W/chest  Result Date: 05/01/2017 CLINICAL DATA:  Constipation.  Recent left hip surgery. EXAM: DG ABDOMEN ACUTE W/ 1V CHEST COMPARISON:  Chest radiograph 04/12/2017 FINDINGS: The cardiomediastinal contours are normal. The lungs are clear. There is no free intra-abdominal air. Air-filled nondistended transverse, descending, and sigmoid colon. No significant formed stool. Few prominent small bowel loops in the left abdomen with air-fluid levels. No radiopaque calculi. No acute osseous abnormalities are seen. Left hip arthroplasty partially included. IMPRESSION: 1. Air-filled nondistended colon with a few prominent small bowel loops in the left abdomen, suggesting generalized ileus. No increased stool burden to suggest constipation. No free air. 2.  No acute pulmonary process. Electronically Signed   By: Jeb Levering M.D.   On: 05/01/2017 23:52    EKG: Not performed.    Assessment/Plan  1. Sepsis secondary to UTI  - Presents with gen weakness and malaise, found to be hypotensive with leukocytosis, AKI, elevated lactate, and now developing rigors in ED  - CXR clear (personally reviewed), UA consistent with infection  - Blood and urine  cultures collected in ED, 2 liters NS given, and empiric vancomycin and Zosyn started  - Give additional bolus to complete 30 cc/kg, continue empiric abx with vancomycin and cefepime pending culture data and clinical course    2. Acute kidney injury superimposed on CKD stage III  - SCr is 2.73 on admission, up from an apparent baseline of 1.4  - Likely prerenal azotemia in setting of sepsis; ATN possible  - Given the associated urosepsis, will be important to exclude obstruction and renal US ordered  - Fluid-resuscitate with 30 cc/kg, hold ARB and HCTZ, avoid nephrotoxins, renally-dose medications, repeat chem panel in am    3. Hyponatremia  - Serum sodium is 129 on admission in setting of hyperglycemia, hypovolemia, and HCTZ use - Fluid-resuscitate with 30 cc/kg, glycemic-control as below, hold HCTZ  - Repeat chem panel in am    4. Hyperglycemia; type II DM - CBG was 336 with EMS, serum glucose 160 on arrival  - A1c was 6.8% last month, currently diet-controlled  - Check CBG q4h and start SSI with Novolog    5. Hypertension  - BP 82/56 on arrival, has hx of HTN  - Hold antihypertensives  - Continue fluid-resuscitation with 30 cc/kg    6. Normocytic anemia  - Hgb is 8.1 on admission, down from 9.9 on 2/17 (POD #2)  - Denies melena or hematochezia; FOBT negative in ED  - Likely secondary to operative losses  - Type and screen, check anemia panel   7. Ileus  - Reports abdominal distension with discomfort and "constipation"  - KUB with air-filled non-distended colon, no constipation, consistent with ileus  - Keep NPO for now, continue prn analgesia and IVF hydration, advance diet as tolerated    8. Hypokalemia  - Serum potassium is 3.4 on admission  - KCl added to maintenance IVF   - Repeat chem panel in am    DVT prophylaxis: sq heparin  Code Status: Full  Family Communication: Discussed with patient Disposition Plan: Admit to SDU Consults called: None Admission status:  Inpatient    Vianne Bulls, MD [facility] Pager [phone]  If 7PM-7AM, please contact night-coverage [email or web address] Password [email or web address]  05/02/2017, 2:07 AM

## 2017-05-02 NOTE — ED Notes (Signed)
Spoke to admitting MD about pt having persistent low blood pressure. Pt AOX4. Pt also appears to be short of breath, lung sounds clear.

## 2017-05-02 NOTE — ED Notes (Signed)
Pharmacist notified to verify pt.'s Heparin order.

## 2017-05-03 ENCOUNTER — Inpatient Hospital Stay (HOSPITAL_COMMUNITY): Payer: Medicare Other

## 2017-05-03 DIAGNOSIS — R609 Edema, unspecified: Secondary | ICD-10-CM

## 2017-05-03 LAB — CBC WITH DIFFERENTIAL/PLATELET
Basophils Absolute: 0 10*3/uL (ref 0.0–0.1)
Basophils Relative: 0 %
Eosinophils Absolute: 0 10*3/uL (ref 0.0–0.7)
Eosinophils Relative: 0 %
HCT: 24.8 % — ABNORMAL LOW (ref 39.0–52.0)
Hemoglobin: 8 g/dL — ABNORMAL LOW (ref 13.0–17.0)
Lymphocytes Relative: 7 %
Lymphs Abs: 0.9 10*3/uL (ref 0.7–4.0)
MCH: 28.9 pg (ref 26.0–34.0)
MCHC: 32.3 g/dL (ref 30.0–36.0)
MCV: 89.5 fL (ref 78.0–100.0)
Monocytes Absolute: 0.4 10*3/uL (ref 0.1–1.0)
Monocytes Relative: 3 %
Neutro Abs: 12.8 10*3/uL — ABNORMAL HIGH (ref 1.7–7.7)
Neutrophils Relative %: 90 %
Platelets: 354 10*3/uL (ref 150–400)
RBC: 2.77 MIL/uL — ABNORMAL LOW (ref 4.22–5.81)
RDW: 16 % — ABNORMAL HIGH (ref 11.5–15.5)
WBC: 14.1 10*3/uL — ABNORMAL HIGH (ref 4.0–10.5)

## 2017-05-03 LAB — ECHOCARDIOGRAM COMPLETE
Height: 71 in
Weight: 4128 oz

## 2017-05-03 LAB — POCT I-STAT 3, ART BLOOD GAS (G3+)
Acid-base deficit: 6 mmol/L — ABNORMAL HIGH (ref 0.0–2.0)
Bicarbonate: 18.8 mmol/L — ABNORMAL LOW (ref 20.0–28.0)
O2 Saturation: 97 %
Patient temperature: 98.6
TCO2: 20 mmol/L — ABNORMAL LOW (ref 22–32)
pCO2 arterial: 31.6 mmHg — ABNORMAL LOW (ref 32.0–48.0)
pH, Arterial: 7.381 (ref 7.350–7.450)
pO2, Arterial: 92 mmHg (ref 83.0–108.0)

## 2017-05-03 LAB — COMPREHENSIVE METABOLIC PANEL
ALT: 17 U/L (ref 17–63)
AST: 36 U/L (ref 15–41)
Albumin: 2.3 g/dL — ABNORMAL LOW (ref 3.5–5.0)
Alkaline Phosphatase: 31 U/L — ABNORMAL LOW (ref 38–126)
Anion gap: 11 (ref 5–15)
BUN: 32 mg/dL — ABNORMAL HIGH (ref 6–20)
CO2: 19 mmol/L — ABNORMAL LOW (ref 22–32)
Calcium: 8.1 mg/dL — ABNORMAL LOW (ref 8.9–10.3)
Chloride: 106 mmol/L (ref 101–111)
Creatinine, Ser: 1.6 mg/dL — ABNORMAL HIGH (ref 0.61–1.24)
GFR calc Af Amer: 47 mL/min — ABNORMAL LOW (ref >60)
GFR calc non Af Amer: 41 mL/min — ABNORMAL LOW (ref >60)
Glucose, Bld: 143 mg/dL — ABNORMAL HIGH (ref 65–99)
Potassium: 3.5 mmol/L (ref 3.5–5.1)
Sodium: 136 mmol/L (ref 135–145)
Total Bilirubin: 0.6 mg/dL (ref 0.3–1.2)
Total Protein: 5.4 g/dL — ABNORMAL LOW (ref 6.5–8.1)

## 2017-05-03 LAB — APTT: aPTT: 33 seconds (ref 24–36)

## 2017-05-03 LAB — MAGNESIUM: Magnesium: 1.9 mg/dL (ref 1.7–2.4)

## 2017-05-03 LAB — GLUCOSE, CAPILLARY
Glucose-Capillary: 117 mg/dL — ABNORMAL HIGH (ref 65–99)
Glucose-Capillary: 124 mg/dL — ABNORMAL HIGH (ref 65–99)
Glucose-Capillary: 131 mg/dL — ABNORMAL HIGH (ref 65–99)
Glucose-Capillary: 168 mg/dL — ABNORMAL HIGH (ref 65–99)
Glucose-Capillary: 142 mg/dL — ABNORMAL HIGH (ref 65–99)
Glucose-Capillary: 186 mg/dL — ABNORMAL HIGH (ref 65–99)

## 2017-05-03 LAB — C-REACTIVE PROTEIN: CRP: 24.6 mg/dL — ABNORMAL HIGH (ref <1.0)

## 2017-05-03 LAB — PROTIME-INR
INR: 1.28
Prothrombin Time: 15.9 seconds — ABNORMAL HIGH (ref 11.4–15.2)

## 2017-05-03 MED ORDER — DOCUSATE SODIUM 100 MG PO CAPS
100.0000 mg | ORAL_CAPSULE | Freq: Two times a day (BID) | ORAL | Status: DC
  Administered 2017-05-03 – 2017-05-05 (×5): 100 mg via ORAL
  Filled 2017-05-03 (×6): qty 1

## 2017-05-03 MED ORDER — SENNA 8.6 MG PO TABS
1.0000 | ORAL_TABLET | Freq: Every day | ORAL | Status: DC | PRN
  Filled 2017-05-03: qty 1

## 2017-05-03 MED ORDER — HYDROCORTISONE NA SUCCINATE PF 100 MG IJ SOLR
50.0000 mg | Freq: Two times a day (BID) | INTRAMUSCULAR | Status: DC
  Administered 2017-05-04: 50 mg via INTRAVENOUS
  Filled 2017-05-03: qty 1
  Filled 2017-05-03: qty 2

## 2017-05-03 MED ORDER — MAGNESIUM OXIDE 400 (241.3 MG) MG PO TABS
400.0000 mg | ORAL_TABLET | Freq: Every day | ORAL | Status: DC | PRN
  Filled 2017-05-03: qty 1

## 2017-05-03 NOTE — Progress Notes (Signed)
VASCULAR LAB PRELIMINARY  PRELIMINARY  PRELIMINARY  PRELIMINARY  Bilateral lower extremity venous duplex completed.    Preliminary report:  There is no DVT or SVT noted in the bilateral lower extremities.  Interstitial fluid noted throughout and pulsatile Doppler waveforms are suggestive of fluid overload.  Radwan Cowley, RVT 05/03/2017, 10:59 AM

## 2017-05-03 NOTE — Progress Notes (Signed)
Pt is functioning at a supervision to min guard assist in ADL. He has all necessary DME and AE and is knowledgeable in hip precautions. No further OT needs.  05/03/17 1200  OT Visit Information  Last OT Received On 05/03/17  Assistance Needed +1  OT goals addressed during session ADL's and self-care  History of Present Illness Pt admitted 05/01/17 with severe sepsis due to UTI and E-coli bactermia. Hospital course complicated by ileus, AKI.  Pt with recent d/c from SNF following L hip fx and ORIF. PMH: CKD, DM, HTN, pt self caths.  Precautions  Precautions Fall;Posterior Hip  Precaution Booklet Issued No  Precaution Comments reviewed 3/3 hip precautions + PWB status  Restrictions  Weight Bearing Restrictions Yes  LLE Weight Bearing PWB  LLE Partial Weight Bearing Percentage or Pounds 50  Home Living  Family/patient expects to be discharged to: Private residence  Living Arrangements Spouse/significant other  Available Help at Discharge Family  Type of [place] to enter  Entrance Stairs-Number of Steps 4  Entrance Stairs-Rails Right  Home Layout Multi-level  Alternate Level Stairs-Number of Steps 2  Alternate Level Stairs-Rails Right  Bathroom Shower/Tub Walk-in shower  Bathroom Toilet Handicapped height  Home Equipment [place] - 2 wheels;Cane - single point;Shower Music therapist  Additional Comments not interested in sock aide  Prior Function  Level of Independence Independent with assistive device(s)  Comments was functioning modified independently in ADL. Uses RW in his shower.  Communication  Communication No difficulties  Pain Assessment  Pain Assessment No/denies pain  Cognition  Arousal/Alertness Awake/alert  Behavior During Therapy WFL for tasks assessed/performed  Overall Cognitive Status Within Functional Limits for tasks assessed  Upper Extremity Assessment  Upper Extremity  Assessment Overall WFL for tasks assessed  Lower Extremity Assessment  Lower Extremity Assessment Defer to PT evaluation  Cervical / Trunk Assessment  Cervical / Trunk Assessment Normal  ADL  Eating/Feeding Independent;Sitting  Grooming Min guard;Standing  Upper Body Bathing Set up;Sitting  Lower Body Bathing Supervison/ safety;Sit to/from stand  Lower Body Bathing Details (indicate cue type and reason) educated in benefits of long handled bath sponge  Upper Body Dressing  Supervision/safety;Sitting  Lower Body Dressing Supervision/safety;Sit to/from stand;Adhering to hip precautions;With adaptive equipment  Lower Body Dressing Details (indicate cue type and reason) pt wears slip on shoes  Toilet Transfer Min guard;RW;Ambulation  Functional mobility during ADLs Min guard;Rolling walker  Vision- History  Baseline Vision/History Wears glasses  Patient Visual Report No change from baseline  Bed Mobility  Overal bed mobility Needs Assistance  Bed Mobility Supine to Sit  Supine to sit Supervision;HOB elevated  General bed mobility comments cues to avoid crossing his ankles to self assist  Transfers  Overall transfer level Needs assistance  Equipment used Rolling walker (2 wheeled)  Transfers Sit to/from Stand  Sit to Stand Supervision  General transfer comment supervision for safety  Balance  Sitting balance-Leahy Scale Good  Standing balance-Leahy Scale Fair  Standing balance comment RW for dynamic mobility  OT - End of Session  Equipment Utilized During Treatment Gait belt;Rolling walker  Activity Tolerance Patient tolerated treatment well  Patient left in chair;with call bell/phone within reach;with family/visitor present  Nurse Communication Mobility status;Precautions;Weight bearing status  OT Assessment  OT Recommendation/Assessment Patient does not need any further OT services  OT Visit Diagnosis Unsteadiness on feet (R26.81);Other abnormalities of gait and mobility  (R26.89);Muscle weakness (generalized) (M62.81)  AM-PAC OT "6 Clicks"  Daily Activity Outcome Measure  Help from another person eating meals? 4  Help from another person taking care of personal grooming? 3  Help from another person toileting, which includes using toliet, bedpan, or urinal? 3  Help from another person bathing (including washing, rinsing, drying)? 3  Help from another person to put on and taking off regular upper body clothing? 4  Help from another person to put on and taking off regular lower body clothing? 3  6 Click Score 20  ADL G Code Conversion CJ  OT Recommendation  Follow Up Recommendations No OT follow up  OT Equipment None recommended by OT  Individuals Consulted  Consulted and Agree with Results and Recommendations Patient  Acute Rehab OT Goals  Patient Stated Goal return home  OT Time Calculation  OT Start Time (ACUTE ONLY) 1229  OT Stop Time (ACUTE ONLY) 1254  OT Time Calculation (min) 25 min  OT General Charges  $OT Visit 1 Visit  OT Evaluation  $OT Eval Moderate Complexity 1 Mod  Written Expression  Dominant Hand Right  05/03/2017 Nestor Lewandowsky, OTR/L Pager: [phone]

## 2017-05-03 NOTE — Evaluation (Signed)
Physical Therapy Evaluation Patient Details Name: [name] [name] MRN: [id number] DOB: Feb 14, 1941 Today's Date: 05/03/2017   History of Present Illness  Pt admitted 05/01/17 with severe sepsis due to UTI and E-coli bactermia. Hospital course complicated by ileus, AKI.  Pt with recent d/c from SNF following L hip fx and ORIF. PMH: CKD, DM, HTN, pt self caths.  Clinical Impression  Pt admitted with above diagnosis. Pt currently with functional limitations due to the deficits listed below (see PT Problem List). Pt was able to ambulate with RW with cues for breathing techniques as he did desat to 88% at times during walk. Reinforced hip precautions with transitions.  Pt Surprised that he had DOE 3/4.  Pt will continue to follow pt.  Pt will benefit from skilled PT to increase their independence and safety with mobility to allow discharge to the venue listed below.      Follow Up Recommendations Supervision for mobility/OOB;Outpatient PT    Equipment Recommendations       Recommendations for Other Services       Precautions / Restrictions Precautions Precautions: Fall;Posterior Hip Precaution Booklet Issued: Yes (comment) Precaution Comments: reviewed 3/3 hip precautions + PWB status Restrictions Weight Bearing Restrictions: Yes LLE Weight Bearing: Partial weight bearing LLE Partial Weight Bearing Percentage or Pounds: 50      Mobility  Bed Mobility Overal bed mobility: Needs Assistance Bed Mobility: Supine to Sit     Supine to sit: Supervision;HOB elevated     General bed mobility comments: supervision for safety  Transfers Overall transfer level: Needs assistance Equipment used: Rolling walker (2 wheeled) Transfers: Sit to/from Stand Sit to Stand: Min guard         General transfer comment: Min Guard for safety  Ambulation/Gait Ambulation/Gait assistance: Min Dispensing optician (Feet): 280 Feet Assistive device: Rolling walker (2 wheeled) Gait  Pattern/deviations: Step-through pattern;Antalgic(emerging) Gait velocity: decreased   General Gait Details: Cues for sequence, and to use Rw to unweigh LLE in stance to 50% PWB; Pt was winded while walking.  O2 sat registered 88% a few times while ambulatiing but most of the time was 92% and above.  Pt with 3/4 dyspnea at times.    Stairs            Wheelchair Mobility    Modified Rankin (Stroke Patients Only)       Balance Overall balance assessment: Needs assistance Sitting-balance support: No upper extremity supported;Feet supported Sitting balance-Leahy Scale: Good     Standing balance support: Bilateral upper extremity supported;During functional activity;No upper extremity supported Standing balance-Leahy Scale: Fair Standing balance comment: RW for dynamic mobility                             Pertinent Vitals/Pain Pain Assessment: No/denies pain    Home Living Family/patient expects to be discharged to:: Private residence Living Arrangements: Spouse/significant other Available Help at Discharge: Family Type of Home: House Home Access: Stairs to enter Entrance Stairs-Rails: Right Entrance Stairs-Number of Steps: 4 Home Layout: Multi-level Home Equipment: Environmental consultant - 2 wheels;Cane - single point;Shower seat;Bedside commode Additional Comments: new comfort height commodes    Prior Function Level of Independence: Independent         Comments: ADLs, IADLs, works in Occupational psychologist. wife is unsteady and uses cane     Hand Dominance   Dominant Hand: Right    Extremity/Trunk Assessment   Upper Extremity Assessment Upper Extremity Assessment: Defer to OT evaluation  Lower Extremity Assessment Lower Extremity Assessment: Generalized weakness    Cervical / Trunk Assessment Cervical / Trunk Assessment: Normal  Communication   Communication: No difficulties  Cognition Arousal/Alertness: Awake/alert Behavior During Therapy: WFL for tasks  assessed/performed Overall Cognitive Status: Within Functional Limits for tasks assessed                                        General Comments General comments (skin integrity, edema, etc.): Pt was showing this PT how he gets OOB and noted that pt attempting to cross legs.  Cued pt that this breaks his hip precautions.  Pt quickly states that his therapist at home didn't tell him that.  Pt was able to get up the correct way without breaking hip precautions.      Exercises Total Joint Exercises Ankle Circles/Pumps: 20 reps Quad Sets: AROM;Left;10 reps   Assessment/Plan    PT Assessment Patient needs continued PT services  PT Problem List Decreased strength;Decreased range of motion;Decreased balance;Decreased activity tolerance;Decreased mobility;Decreased knowledge of precautions       PT Treatment Interventions DME instruction;Gait training;Stair training;Functional mobility training;Therapeutic activities;Therapeutic exercise;Balance training;Patient/family education    PT Goals (Current goals can be found in the Care Plan section)  Acute Rehab PT Goals Patient Stated Goal: return home PT Goal Formulation: With patient Time For Goal Achievement: 05/17/17 Potential to Achieve Goals: Good    Frequency Min 5X/week   Barriers to discharge        Co-evaluation PT/OT/SLP Co-Evaluation/Treatment: Yes(co-session/dovetail) Reason for Co-Treatment: For patient/therapist safety PT goals addressed during session: Mobility/safety with mobility         AM-PAC PT "6 Clicks" Daily Activity  Outcome Measure Difficulty turning over in bed (including adjusting bedclothes, sheets and blankets)?: None Difficulty moving from lying on back to sitting on the side of the bed? : None Difficulty sitting down on and standing up from a chair with arms (e.g., wheelchair, bedside commode, etc,.)?: None Help needed moving to and from a bed to chair (including a wheelchair)?:  None Help needed walking in hospital room?: A Little Help needed climbing 3-5 steps with a railing? : A Little 6 Click Score: 22    End of Session Equipment Utilized During Treatment: Gait belt Activity Tolerance: Patient tolerated treatment well Patient left: in chair;with call bell/phone within reach;with family/visitor present Nurse Communication: Mobility status PT Visit Diagnosis: Unsteadiness on feet (R26.81);Other abnormalities of gait and mobility (R26.89);Muscle weakness (generalized) (M62.81);Other (comment)    Time: 2248-2500 PT Time Calculation (min) (ACUTE ONLY): 31 min   Charges:   PT Evaluation $PT Eval Moderate Complexity: 1 Mod     PT G Codes:        Doral Digangi,PT Acute Rehabilitation [phone] (pager)   Denice Paradise 05/03/2017, 2:14 PM

## 2017-05-03 NOTE — Progress Notes (Signed)
Report called to 6N 28 RN, tx w/ Helene Kelp RN, recvd by [employer] staff

## 2017-05-03 NOTE — Progress Notes (Signed)
PULMONARY / CRITICAL CARE MEDICINE   Name: [name] [name] MRN: [id number] DOB: 31-Mar-1940    ADMISSION DATE:  05/01/2017 CONSULTATION DATE: May 02, 2017  REFERRING MD: ED physician hospitalist team  CHIEF COMPLAINT: Weakness  HISTORY OF PRESENT ILLNESS:  77yo male with hx chronic kidney disease stage III recent left hip fracture ORIF on February 15 presented to ER 3/2 with weakness. Has known prostate problems, was doing self I/o cath at home post hip surgery at the recommendation of his urologist to avoid bladder stretching.  He was diagnosed with UTI given his pyuria and a urinalysis.  Patient was initially improving but then he became very hypotensive even after 4 liters of crystalloid resuscitation ultimately requiring pressors and PCCM consulted for ICU tx and CVL.    SUBJECTIVE:    VITAL SIGNS: BP 125/84   Pulse 61   Temp 97.7 F (36.5 C) (Oral)   Resp 13   Ht 5\' 11"  (1.803 m)   Wt 125.8 kg (277 lb 5.4 oz)   SpO2 100%   BMI 38.68 kg/m   HEMODYNAMICS:    VENTILATOR SETTINGS:    INTAKE / OUTPUT: I/O last 3 completed shifts: In: 7750 [I.V.:2250; IV Piggyback:5500] Out: 2771 [Urine:2770; Stool:1]  PHYSICAL EXAMINATION: General:  NAD , pleasant and hungry , wants to eat . Neuro:  WNL , AOX3 , EOMI , CN II-XII intact , UL , LL strength is symmetrical and 5/5 HEENT:  atraumatic , no jaundice , dry mucous membranes  Cardiovascular:  Irregular irregular , ESM 2/6 in the aortic area  Lungs:  CTA bilateral , no wheezing or crackles  Abdomen:  Soft lax +BS , no tenderness . Musculoskeletal:  WNL , normal pulses  Skin:  No rash    LABS:  BMET Recent Labs  Lab 05/01/17 2230 05/02/17 0334 05/03/17 0450  NA 129* 128* 136  K 3.4* 3.7 3.5  CL 98* 101 106  CO2 20* 16* 19*  BUN 30* 28* 32*  CREATININE 2.23* 2.01* 1.60*  GLUCOSE 160* 139* 143*    Electrolytes Recent Labs  Lab 05/01/17 2230 05/02/17 0334 05/03/17 0450  CALCIUM 8.3* 7.6* 8.1*  MG  --  1.3* 1.9     CBC Recent Labs  Lab 05/01/17 2230 05/02/17 0334 05/03/17 0450  WBC 20.6* 7.9 14.1*  HGB 8.1* 9.2* 8.0*  HCT 25.0* 28.4* 24.8*  PLT 430* 336 354    Coag's Recent Labs  Lab 05/03/17 0450  APTT 33  INR 1.28    Sepsis Markers Recent Labs  Lab 05/01/17 2253 05/02/17 0057 05/02/17 0334  LATICACIDVEN 2.07* 1.23 1.9    ABG Recent Labs  Lab 05/03/17 0343  PHART 7.381  PCO2ART 31.6*  PO2ART 92.0    Liver Enzymes Recent Labs  Lab 05/03/17 0450  AST 36  ALT 17  ALKPHOS 31*  BILITOT 0.6  ALBUMIN 2.3*    Cardiac Enzymes Recent Labs  Lab 05/02/17 0442 05/02/17 1313 05/02/17 1651  TROPONINI 0.05* 0.24* 0.16*    Glucose Recent Labs  Lab 05/02/17 1309 05/02/17 1715 05/02/17 1949 05/02/17 2341 05/03/17 0359 05/03/17 0757  GLUCAP 121* 125* 133* 117* 124* 131*    Imaging Dg Abd 1 View  Result Date: 05/02/2017 CLINICAL DATA:  Abdominal distension, follow-up ileus EXAM: ABDOMEN - 1 VIEW COMPARISON:  05/01/2017 FINDINGS: Mild gaseous distension of small bowel and colon. There is no bowel dilatation to suggest obstruction. There is no evidence of pneumoperitoneum, portal venous gas or pneumatosis. There are no pathologic  calcifications along the expected course of the ureters. The osseous structures are unremarkable. IMPRESSION: Mild gaseous distention of small bowel and colon likely reflecting resolving ileus. Electronically Signed   By: Kathreen Devoid   On: 05/02/2017 11:21   Dg Chest Port 1 View  Result Date: 05/03/2017 CLINICAL DATA:  CHF EXAM: PORTABLE CHEST 1 VIEW COMPARISON:  05/02/2017 FINDINGS: Cardiac shadow is stable. Right jugular central line is again seen. Lungs are well aerated without focal infiltrate or sizable effusion. Stable mild prominent central vascularity is noted. No new effusion is seen. No bony abnormality is noted. IMPRESSION: No significant change from the prior exam. Electronically Signed   By: Inez Catalina M.D.   On: 05/03/2017  06:59   Dg Chest Port 1 View  Result Date: 05/02/2017 CLINICAL DATA:  Encounter for central line placement. EXAM: PORTABLE CHEST 1 VIEW COMPARISON:  05/02/2017 at 4:02 a.m. FINDINGS: Right internal jugular central venous line tip projects in the lower superior vena cava. No pneumothorax. Appearance of the lungs is without change from the earlier exam allowing for differences in lung volume and patient positioning. IMPRESSION: 1. New right internal jugular central venous line tip projects in the lower superior vena cava. No pneumothorax. 2. No other change from the earlier study. Electronically Signed   By: Lajean Manes M.D.   On: 05/02/2017 13:35   Dg Hip Unilat With Pelvis 2-3 Views Left  Result Date: 05/02/2017 CLINICAL DATA:  Left hip replacement EXAM: DG HIP (WITH OR WITHOUT PELVIS) 2-3V LEFT COMPARISON:  04/16/2017 FINDINGS: Left total hip arthroplasty without failure or complication. Lateral sideplate and interlocking cerclage wires are present. No fracture or dislocation. Mild osteoarthritis of the right hip. IMPRESSION: 1. Left hip arthroplasty without failure or complication. Electronically Signed   By: Kathreen Devoid   On: 05/02/2017 11:23     STUDIES:  Renal u/s 3/3>>> 1. No hydronephrosis or obstructive uropathy. 2. Mild increased renal echogenicity suggesting chronic medical renal disease. 3. Left renal cyst. 2D echo 3/4>>> EF 50-55%, mild AR  ABX  vanc 3/3>>>3/4 Ceftriaxone 3/3>>>  MICRO:  Urine 3/3>>> GNR>>>  BC x 2 3/3>>>GNR>>> Ecoli>>>  LINES/TUBES: R IJ CVL 3/3>>>  DISCUSSION: 88 with severe septic shock on pressors in setting UTI, E Coli bacteremia   ASSESSMENT / PLAN:  PULMONARY Dyspnea  Pulmonary vascular congestion - after volume resuscitation in setting septic shock P:  F/u CXR  Pulmonary hygiene  Supplemental O2 as needed to keep sats >92  CARDIOVASCULAR Severe shock - resolved.  Off pressors.   PLAN -  KVO IVF  Continue to hold home anti-HTN - can  likely resume in am 3/5 Wean stress steroids, likely d/c in am   RENAL AKI on CKD III -- baseline Scr ~1.4 UTI  Urinary retention - self I/O cath PRN at home  PLAN -  Modena IVF  F/u chem  Continue foley for now   GASTROINTESTINAL Ileus - resolving  P:  Continue bowel regimen  PO diet - advance slowly   HEMATOLOGIC Anemia - suspect dilutional to some degree - no obvious s/s bleeding.  Severe vitamin B12 deficiency P:  F/u CBC  SQ heparin  Continue Vit B   INFECTIOUS UTI  EColi bacteremia  P:  Continue ceftriaxone  D/c vanc  Follow sensitivities    ENDOCRINE DM  P:  Continue SSI  Carb mod diet   NEUROLOGIC No active issue    Advance diet, OOB, off pressors.  Will tx to med-surg.  Will  ask TR[facility] to assume care 3/5 with PCCM off.     Nickolas Madrid, NP 05/03/2017  10:14 AM Pager: [phone] or [phone]

## 2017-05-04 ENCOUNTER — Other Ambulatory Visit: Payer: Self-pay

## 2017-05-04 LAB — BASIC METABOLIC PANEL
Anion gap: 9 (ref 5–15)
BUN: 34 mg/dL — ABNORMAL HIGH (ref 6–20)
CO2: 22 mmol/L (ref 22–32)
Calcium: 8.5 mg/dL — ABNORMAL LOW (ref 8.9–10.3)
Chloride: 108 mmol/L (ref 101–111)
Creatinine, Ser: 1.44 mg/dL — ABNORMAL HIGH (ref 0.61–1.24)
GFR calc Af Amer: 54 mL/min — ABNORMAL LOW (ref >60)
GFR calc non Af Amer: 46 mL/min — ABNORMAL LOW (ref >60)
Glucose, Bld: 105 mg/dL — ABNORMAL HIGH (ref 65–99)
Potassium: 3.2 mmol/L — ABNORMAL LOW (ref 3.5–5.1)
Sodium: 139 mmol/L (ref 135–145)

## 2017-05-04 LAB — CULTURE, BLOOD (ROUTINE X 2): Special Requests: ADEQUATE

## 2017-05-04 LAB — CBC WITH DIFFERENTIAL/PLATELET
Basophils Absolute: 0 10*3/uL (ref 0.0–0.1)
Basophils Relative: 0 %
Eosinophils Absolute: 0 10*3/uL (ref 0.0–0.7)
Eosinophils Relative: 0 %
HCT: 26.1 % — ABNORMAL LOW (ref 39.0–52.0)
Hemoglobin: 8.5 g/dL — ABNORMAL LOW (ref 13.0–17.0)
Lymphocytes Relative: 13 %
Lymphs Abs: 1.7 10*3/uL (ref 0.7–4.0)
MCH: 28.5 pg (ref 26.0–34.0)
MCHC: 32.6 g/dL (ref 30.0–36.0)
MCV: 87.6 fL (ref 78.0–100.0)
Monocytes Absolute: 1 10*3/uL (ref 0.1–1.0)
Monocytes Relative: 8 %
Neutro Abs: 9.6 10*3/uL — ABNORMAL HIGH (ref 1.7–7.7)
Neutrophils Relative %: 79 %
Platelets: 445 10*3/uL — ABNORMAL HIGH (ref 150–400)
RBC: 2.98 MIL/uL — ABNORMAL LOW (ref 4.22–5.81)
RDW: 15.4 % (ref 11.5–15.5)
WBC: 12.3 10*3/uL — ABNORMAL HIGH (ref 4.0–10.5)

## 2017-05-04 LAB — GLUCOSE, CAPILLARY
Glucose-Capillary: 108 mg/dL — ABNORMAL HIGH (ref 65–99)
Glucose-Capillary: 129 mg/dL — ABNORMAL HIGH (ref 65–99)
Glucose-Capillary: 132 mg/dL — ABNORMAL HIGH (ref 65–99)
Glucose-Capillary: 132 mg/dL — ABNORMAL HIGH (ref 65–99)
Glucose-Capillary: 159 mg/dL — ABNORMAL HIGH (ref 65–99)
Glucose-Capillary: 95 mg/dL (ref 65–99)

## 2017-05-04 LAB — URINE CULTURE: Culture: >=100000 — AB

## 2017-05-04 MED ORDER — HYDROCORTISONE NA SUCCINATE PF 100 MG IJ SOLR
50.0000 mg | INTRAMUSCULAR | Status: DC
  Administered 2017-05-05: 50 mg via INTRAVENOUS
  Filled 2017-05-04: qty 2

## 2017-05-04 MED ORDER — CEPHALEXIN 500 MG PO CAPS
500.0000 mg | ORAL_CAPSULE | Freq: Three times a day (TID) | ORAL | Status: DC
  Administered 2017-05-04 – 2017-05-05 (×3): 500 mg via ORAL
  Filled 2017-05-04 (×3): qty 1

## 2017-05-04 MED ORDER — LEVOFLOXACIN 500 MG PO TABS
500.0000 mg | ORAL_TABLET | Freq: Every day | ORAL | Status: DC
Start: 2017-05-04 — End: 2017-05-04

## 2017-05-04 NOTE — Progress Notes (Signed)
Hospitalist progress note   [name] [name]  CHY:[id number] DOB: 1940-05-10 DOA: 05/01/2017 PCP: Unk Pinto, MD   Specialists:   Brief Narrative:  77yo male with hx chronic kidney disease stage III recent left hip fracture ORIF on February 15 presented to ER 3/2 with weakness. Has known prostate problems, was doing self I/o cath at home post hip surgery at the recommendation of his urologist to avoid bladder stretching.  He was diagnosed with UTI given his pyuria and a urinalysis.  Patient was initially improving but then he became very hypotensive even after 4 liters of crystalloid resuscitation ultimately requiring pressors and PCCM consulted for ICU tx and CVL    Assessment & Plan:   Assessment:  The primary encounter diagnosis was Acute cystitis without hematuria. Diagnoses of AKI (acute kidney injury) ([place]), Ileus ([place]), Sepsis ([place]), Abdominal distension, Pain, Dyspnea, CHF (congestive heart failure) ([place]), and Encounter for central line placement were also pertinent to this visit.  Pyelo-E-coli bacteremia--self cath at home-change ceftriaxone to PO levaquin, duration Rx 2 weeks ending 3/16, taper stress dsoe cortef to once daily--back to self-caht on d/c?  Iatrogenic fluid overload-holding IVF-keepign vol even-no rale son exam monitor  Recent hip surgery 2/15-some swelling to LLE but chronic-monitor-no need duplex at this time--using ASA 325 bid for DVT proph  BPH-cont finasteriden 5, flomax 4.  Will need resumption of OP regmine with self-cath when d/c  htn-Cont losartan 100 qd, Ziac on hold   DVT prophylaxis: lovenox  Code Status:   full   Family Communication:    none  Disposition Plan: ip--can possibly d/c in 24-48 depending on progress, fever curve   Consultants:   noehn  Procedures:   none  Antimicrobials:   Ceftriaxone-->Levaquin 3/5   Subjective:  Well.  No issues  Objective: Vitals:   05/03/17 1550 05/03/17 1844 05/03/17 2021 05/04/17 0446  BP:   136/74 131/79 (!) 135/92  Pulse:  81 82 72  Resp:  18 18 18   Temp: 98.5 F (36.9 C) 98.2 F (36.8 C) 97.9 F (36.6 C) 98.8 F (37.1 C)  TempSrc: Oral Oral Oral Oral  SpO2:  100% 100% 100%  Weight:  123.4 kg (272 lb 0.8 oz)    Height:  5\' 11"  (1.803 m)      Intake/Output Summary (Last 24 hours) at 05/04/2017 1114 Last data filed at 05/04/2017 1018 Gross per 24 hour  Intake 1253 ml  Output 1951 ml  Net -698 ml   Filed Weights   05/01/17 2216 05/03/17 0500 05/03/17 1844  Weight: 117 kg (258 lb) 125.8 kg (277 lb 5.4 oz) 123.4 kg (272 lb 0.8 oz)    Examination:  eomi ncat no distress No pallor no ict cta b no sound s1 s 2no m abd soft LLE>RLE but no chord   Data Reviewed: I have personally reviewed following labs and imaging studies  CBC: Recent Labs  Lab 05/01/17 2230 05/02/17 0334 05/03/17 0450 05/04/17 0621  WBC 20.6* 7.9 14.1* 12.3*  NEUTROABS 18.4* 7.4 12.8* 9.6*  HGB 8.1* 9.2* 8.0* 8.5*  HCT 25.0* 28.4* 24.8* 26.1*  MCV 88.7 89.3 89.5 87.6  PLT 430* 336 354 878*   Basic Metabolic Panel: Recent Labs  Lab 05/01/17 2230 05/02/17 0334 05/03/17 0450 05/04/17 0621  NA 129* 128* 136 139  K 3.4* 3.7 3.5 3.2*  CL 98* 101 106 108  CO2 20* 16* 19* 22  GLUCOSE 160* 139* 143* 105*  BUN 30* 28* 32* 34*  CREATININE 2.23* 2.01* 1.60*  1.44*  CALCIUM 8.3* 7.6* 8.1* 8.5*  MG  --  1.3* 1.9  --    GFR: Estimated Creatinine Clearance: 60.2 mL/min (A) (by C-G formula based on SCr of 1.44 mg/dL (H)). Liver Function Tests: Recent Labs  Lab 05/03/17 0450  AST 36  ALT 17  ALKPHOS 31*  BILITOT 0.6  PROT 5.4*  ALBUMIN 2.3*   No results for input(s): LIPASE, AMYLASE in the last 168 hours. No results for input(s): AMMONIA in the last 168 hours. Coagulation Profile: Recent Labs  Lab 05/03/17 0450  INR 1.28   Cardiac Enzymes: Recent Labs  Lab 05/02/17 0442 05/02/17 1313 05/02/17 1651  TROPONINI 0.05* 0.24* 0.16*   CBG: Recent Labs  Lab 05/03/17 1550  05/03/17 2019 05/04/17 0007 05/04/17 0444 05/04/17 0815  GLUCAP 142* 186* 159* 95 132*   Urine analysis:    Component Value Date/Time   COLORURINE AMBER (A) 05/02/2017 0046   APPEARANCEUR CLOUDY (A) 05/02/2017 0046   LABSPEC 1.017 05/02/2017 0046   PHURINE 5.0 05/02/2017 0046   GLUCOSEU NEGATIVE 05/02/2017 0046   HGBUR MODERATE (A) 05/02/2017 0046   BILIRUBINUR NEGATIVE 05/02/2017 0046   KETONESUR NEGATIVE 05/02/2017 0046   PROTEINUR 100 (A) 05/02/2017 0046   NITRITE POSITIVE (A) 05/02/2017 0046   LEUKOCYTESUR LARGE (A) 05/02/2017 0046     Radiology Studies: Reviewed images personally in health database    Scheduled Meds: . aspirin EC  325 mg Oral BID  . B-complex with vitamin C  1 tablet Oral Daily  . cyanocobalamin  1,000 mcg Subcutaneous Daily  . docusate sodium  100 mg Oral BID  . finasteride  5 mg Oral Daily  . heparin  5,000 Units Subcutaneous Q8H  . hydrocortisone sod succinate (SOLU-CORTEF) inj  50 mg Intravenous Q12H  . insulin aspart  0-9 Units Subcutaneous Q4H  . omega-3 acid ethyl esters  1 g Oral Daily  . pantoprazole  40 mg Oral Daily  . sodium chloride flush  3 mL Intravenous Q12H   Continuous Infusions: . sodium chloride 10 mL/hr (05/03/17 1022)  . cefTRIAXone (ROCEPHIN)  IV Stopped (05/03/17 2307)     LOS: 2 days    Time spent: [place], MD [facility]   If 7PM-7AM, please contact night-coverage [email or web address] Password [email or web address] 05/04/2017, 11:14 AM

## 2017-05-04 NOTE — Progress Notes (Signed)
Physical Therapy Treatment Patient Details Name: [name] [name] MRN: [id number] DOB: 27-May-1940 Today's Date: 05/04/2017    History of Present Illness Pt admitted 05/01/17 with severe sepsis due to UTI and E-coli bactermia. Hospital course complicated by ileus, AKI.  Pt with recent d/c from SNF following L hip fx and ORIF. PMH: CKD, DM, HTN, pt self caths.    PT Comments    Pt is up to walk with PT and was assisted to ice the L hip afterward due to his complaints of edema and pain in the entire peri area.  He is struggling to tolerate the gait due to the irritation and did talk with him about asking for ice as well as asking nursing to provide reusable packs for him.  The nurse will ck into this for him.  Pt is appropriate to continue on acutely for therapy and to progress him as tolerated to increase control of the L hip.  He is not aware of precautions but did talk with him about them during therapy and mobility.   Follow Up Recommendations  Supervision for mobility/OOB;Outpatient PT     Equipment Recommendations  None recommended by PT    Recommendations for Other Services OT consult     Precautions / Restrictions Precautions Precautions: Fall;Posterior Hip Precaution Booklet Issued: No Precaution Comments: reviewed and only knew 1/3 Restrictions Weight Bearing Restrictions: Yes RUE Weight Bearing: Partial weight bearing LLE Weight Bearing: Partial weight bearing LLE Partial Weight Bearing Percentage or Pounds: 50    Mobility  Bed Mobility Overal bed mobility: Needs Assistance             General bed mobility comments: up in chair when PT arrived  Transfers Overall transfer level: Needs assistance Equipment used: Rolling walker (2 wheeled) Transfers: Sit to/from Stand Sit to Stand: Min guard         General transfer comment: reminders for safety  Ambulation/Gait Ambulation/Gait assistance: Min guard Ambulation Distance (Feet): 250 Feet Assistive device:  Rolling walker (2 wheeled) Gait Pattern/deviations: Step-through pattern;Decreased stride length;Decreased stance time - left;Wide base of support;Trunk flexed Gait velocity: decreased Gait velocity interpretation: Below normal speed for age/gender General Gait Details: Pt was SOB with the UE effort to maintain PWB but no drops in sats with effort   Stairs            Wheelchair Mobility    Modified Rankin (Stroke Patients Only)       Balance Overall balance assessment: Needs assistance Sitting-balance support: Feet supported Sitting balance-Leahy Scale: Good     Standing balance support: Bilateral upper extremity supported Standing balance-Leahy Scale: Fair Standing balance comment: RW for dynamic mobility                            Cognition Arousal/Alertness: Awake/alert Behavior During Therapy: WFL for tasks assessed/performed Overall Cognitive Status: Within Functional Limits for tasks assessed                                 General Comments: reminded of 50% wb and reviewed posterior precautions      Exercises Total Joint Exercises Ankle Circles/Pumps: Both;10 reps Quad Sets: Both;10 reps Gluteal Sets: Both;10 reps    General Comments General comments (skin integrity, edema, etc.): talked with pt about maintaining PWB with standing up at walker      Pertinent Vitals/Pain Pain Assessment: Faces Faces Pain Scale:  Hurts little more Pain Location: L Hip and scrotum Pain Descriptors / Indicators: Aching;Sore Pain Intervention(s): Limited activity within patient's tolerance;Monitored during session;Premedicated before session;Repositioned;Ice applied    Home Living                      Prior Function            PT Goals (current goals can now be found in the care plan section) Acute Rehab PT Goals Patient Stated Goal: return home Progress towards PT goals: Progressing toward goals    Frequency    Min  5X/week      PT Plan Current plan remains appropriate    Co-evaluation              AM-PAC PT "6 Clicks" Daily Activity  Outcome Measure  Difficulty turning over in bed (including adjusting bedclothes, sheets and blankets)?: None Difficulty moving from lying on back to sitting on the side of the bed? : None Difficulty sitting down on and standing up from a chair with arms (e.g., wheelchair, bedside commode, etc,.)?: None Help needed moving to and from a bed to chair (including a wheelchair)?: None Help needed walking in hospital room?: A Little Help needed climbing 3-5 steps with a railing? : A Little 6 Click Score: 22    End of Session Equipment Utilized During Treatment: Gait belt Activity Tolerance: Patient tolerated treatment well Patient left: in chair;with call bell/phone within reach Nurse Communication: Mobility status PT Visit Diagnosis: Unsteadiness on feet (R26.81);Other abnormalities of gait and mobility (R26.89);Muscle weakness (generalized) (M62.81);Other (comment)     Time: 1610-9604 PT Time Calculation (min) (ACUTE ONLY): 23 min  Charges:  $Gait Training: 8-22 mins $Therapeutic Exercise: 8-22 mins                    G Codes:  Functional Assessment Tool Used: AM-PAC 6 Clicks Basic Mobility     Ramond Dial 05/04/2017, 1:27 PM   Mee Hives, PT MS Acute Rehab Dept. Number: [place] [facility] [place]

## 2017-05-05 DIAGNOSIS — R7881 Bacteremia: Secondary | ICD-10-CM

## 2017-05-05 DIAGNOSIS — R338 Other retention of urine: Secondary | ICD-10-CM

## 2017-05-05 LAB — CBC WITH DIFFERENTIAL/PLATELET
Basophils Absolute: 0 10*3/uL (ref 0.0–0.1)
Basophils Relative: 0 %
Eosinophils Absolute: 0.2 10*3/uL (ref 0.0–0.7)
Eosinophils Relative: 2 %
HCT: 26.1 % — ABNORMAL LOW (ref 39.0–52.0)
Hemoglobin: 8.4 g/dL — ABNORMAL LOW (ref 13.0–17.0)
Lymphocytes Relative: 21 %
Lymphs Abs: 1.7 10*3/uL (ref 0.7–4.0)
MCH: 28.2 pg (ref 26.0–34.0)
MCHC: 32.2 g/dL (ref 30.0–36.0)
MCV: 87.6 fL (ref 78.0–100.0)
Monocytes Absolute: 1 10*3/uL (ref 0.1–1.0)
Monocytes Relative: 12 %
Neutro Abs: 5.4 10*3/uL (ref 1.7–7.7)
Neutrophils Relative %: 65 %
Platelets: 460 10*3/uL — ABNORMAL HIGH (ref 150–400)
RBC: 2.98 MIL/uL — ABNORMAL LOW (ref 4.22–5.81)
RDW: 15.4 % (ref 11.5–15.5)
WBC: 8.3 10*3/uL (ref 4.0–10.5)

## 2017-05-05 LAB — GLUCOSE, CAPILLARY
Glucose-Capillary: 106 mg/dL — ABNORMAL HIGH (ref 65–99)
Glucose-Capillary: 108 mg/dL — ABNORMAL HIGH (ref 65–99)
Glucose-Capillary: 115 mg/dL — ABNORMAL HIGH (ref 65–99)
Glucose-Capillary: 141 mg/dL — ABNORMAL HIGH (ref 65–99)
Glucose-Capillary: 186 mg/dL — ABNORMAL HIGH (ref 65–99)

## 2017-05-05 MED ORDER — CEPHALEXIN 500 MG PO CAPS: 500.0000 mg | ORAL_CAPSULE | Freq: Four times a day (QID) | ORAL | 0 refills | Status: DC

## 2017-05-05 MED ORDER — TAMSULOSIN HCL 0.4 MG PO CAPS
0.4000 mg | ORAL_CAPSULE | Freq: Every day | ORAL | Status: DC
  Administered 2017-05-05: 0.4 mg via ORAL
  Filled 2017-05-05: qty 1

## 2017-05-05 MED ORDER — VITAMIN B-12 1000 MCG PO TABS: 1000.0000 ug | ORAL_TABLET | Freq: Every day | ORAL | 0 refills | Status: DC

## 2017-05-05 MED ORDER — POTASSIUM CHLORIDE CRYS ER 20 MEQ PO TBCR
40.0000 meq | EXTENDED_RELEASE_TABLET | Freq: Once | ORAL | Status: AC
  Administered 2017-05-05: 40 meq via ORAL
  Filled 2017-05-05: qty 2

## 2017-05-05 NOTE — Progress Notes (Signed)
Foley catheter removed at 1330 as per MD order.  Patient does not wish to return home with foley. Procured order for Flomax for pt as he had been on this at home.  Pt voided 50 ml clear urine after 15 minutes.

## 2017-05-05 NOTE — Discharge Summary (Addendum)
Physician Discharge Summary  [name] [name] CZY:[id number] DOB: 11/17/1940  PCP: Unk Pinto, MD  Admit date: 05/01/2017 Discharge date: 05/05/2017  Recommendations for Outpatient Follow-up:  1. Dr. Unk Pinto, PCP 5 days with repeat labs (CBC & BMP). 2. Dr. Frederik Pear, Orthopedics: Patient advised to call tomorrow for follow-up appointment. 3. Dr. Franchot Gallo, Urology.  I have discussed with Dr. Diona Fanti who will also assist with arranging follow-up.  Home Health: Outpatient PT Equipment/Devices: None  Discharge Condition: Improved and stable CODE STATUS: Full Diet recommendation: Heart healthy diet.  Discharge Diagnoses:  Principal Problem:   Sepsis secondary to UTI [facility]) Active Problems:   Hypertension   CKD (chronic kidney disease) stage 3, GFR 30-59 ml/min (HCC)   AKI (acute kidney injury) ([place])   Hyponatremia   Normocytic anemia   Hypotension   Hyperglycemia   Ileus (HCC)   Hypokalemia   Septic shock (HCC)   Sepsis (HCC)   Vitamin B12 deficiency   Brief Summary: 77 year old male with PMH of diet controlled DM 2, HTN, stage III chronic kidney disease, recent left greater trochanteric fracture status post ORIF 04/16/17, known prostate problems, was doing self in and out catheterization at home post hip surgery at the recommendation of his urologist to avoid bladder stretching, presented to ED on 3/3 with generalized weakness, malaise and subjective fevers.  He was admitted for sepsis due to catheter associated UTI.  He was initially improving but then became very hypotensive even after 4 L of IV crystalloid resuscitation and hence required transfer to ICU, pressors and CCM consultation.  Assessment and plan:  1. Septic shock due to catheter associated E. coli UTI and bacteremia: Blood cultures 1 of 2 from 3/3 and urine culture showed E. coli resistant to ampicillin, ampicillin/sulbactam but otherwise pansensitive.  Initially treated with Zosyn, then transition  to ceftriaxone followed by oral Keflex since 05/04/17.  As stated above, despite aggressive IV fluid resuscitation per sepsis protocol, patient developed shock, transferred to ICU under CCM care, required brief vasopressors, stress dose steroids.  Shock resolved.  Steroids tapered to discontinue.  I discussed with infectious disease MD on call today who recommended completing total 10 days of oral Keflex.  Patient was counseled extensively regarding using sterile technique for in and out catheterization and he verbalizes understanding. 2. Dyspnea/pulmonary vascular congestion: Due to volume resuscitation in setting of septic shock.  No hypoxia and saturating at 100% on room air.  Resuming Ziac at discharge and the diuretic should help his lower extremity and some scrotal edema. 3. Essential hypertension: Blood pressure starting to climb.  Resume prior home antihypertensives at discharge. 4. Acute on stage III chronic kidney disease: Baseline creatinine probably in the 1.4 range.  Presented with creatinine of 2.23.  This is improved to 1.4.  Follow BMP closely as outpatient next week.  Renal ultrasound without hydronephrosis. 5. Urinary retention: Treated with Foley catheter.  He was advised to return home with indwelling Foley catheter until outpatient follow-up with his primary urologist.  Patient vehemently declines and states that he does not plan to go home with a catheter.  He states that he is well aware of managing his in and out catheter by sterile technique at home.  I discussed with his urologist to agrees and states that patient is well qualified to manage his catheter at home.  Foley catheter was discontinued.  Patient voided couple times since catheter was removed.  DC home with in and out self catheterization as needed.  Continue Flomax  and Proscar.  Outpatient follow-up with urology. 6. Ileus: Resolved.  Likely due to acute illness. 7. Normocytic anemia: Suspect some of this is dilutional due to  IV fluid resuscitation.  Stable.  Also continue to supplement B12. 8. B12 insufficiency: B12 level: 147.  Received a couple days of parenteral B12 supplements while hospitalized.  Continue oral B12 supplements at discharge and follow levels in a month or 2. 9. Diet controlled DM 2: Monitor outpatient. 10. Status post ORIF left greater trochanteric fracture 04/16/17: Patient reports that he was supposed to start outpatient PT with his orthopedic MD on day of admission.  Resume outpatient PT.  Also on aspirin 325 mg twice daily likely for DVT prophylaxis postop and defer management to orthopedics. 11. Elevated troponin: Possibly due to septic shock and acute kidney injury.  No chest pain.  Downward trend.  TTE: LVEF 50-55%.  No regional wall motion abnormalities. 12. Hypokalemia: Replaced prior to discharge.  Outpatient follow-up.   Consultations:  CCM  Procedures:  Foley catheter-discontinued 05/05/17   Discharge Instructions  Discharge Instructions    (HEART FAILURE PATIENTS) Call MD:  Anytime you have any of the following symptoms: 1) 3 pound weight gain in 24 hours or 5 pounds in 1 week 2) shortness of breath, with or without a dry hacking cough 3) swelling in the hands, feet or stomach 4) if you have to sleep on extra pillows at night in order to breathe.   Complete by:  As directed    Call MD for:  difficulty breathing, headache or visual disturbances   Complete by:  As directed    Call MD for:  extreme fatigue   Complete by:  As directed    Call MD for:  persistant dizziness or light-headedness   Complete by:  As directed    Call MD for:  persistant nausea and vomiting   Complete by:  As directed    Call MD for:  redness, tenderness, or signs of infection (pain, swelling, redness, odor or green/yellow discharge around incision site)   Complete by:  As directed    Call MD for:  severe uncontrolled pain   Complete by:  As directed    Call MD for:  temperature >100.4   Complete by:   As directed    Diet - low sodium heart healthy   Complete by:  As directed    Increase activity slowly   Complete by:  As directed        Medication List    STOP taking these medications   gabapentin 300 MG capsule Commonly known as:  NEURONTIN   oxyCODONE-acetaminophen 5-325 MG tablet Commonly known as:  PERCOCET/ROXICET     TAKE these medications   aspirin EC 325 MG tablet Take 1 tablet (325 mg total) by mouth 2 (two) times daily.   b complex vitamins tablet Take 1 tablet by mouth daily.   bisoprolol-hydrochlorothiazide 5-6.25 MG tablet Commonly known as:  ZIAC TAKE 1 TABLET BY MOUTH EVERY MORNING FOR BLOOD PRESSURE   cephALEXin 500 MG capsule Commonly known as:  KEFLEX Take 1 capsule (500 mg total) by mouth 4 (four) times daily.   fenofibrate 145 MG tablet Commonly known as:  TRICOR TAKE 1 TABLET BY MOUTH DAILY FOR TRIGLYCERIDES/BLOOD FATS What changed:  See the new instructions.   finasteride 5 MG tablet Commonly known as:  PROSCAR Take 5 mg by mouth daily.   Fish Oil 1200 MG Caps Take 1,200 mg by mouth daily.   FLAX  SEEDS PO Take 1,000 mg by mouth daily.   ibuprofen 200 MG tablet Commonly known as:  ADVIL,MOTRIN Take 600 mg by mouth every 6 (six) hours as needed for fever or moderate pain.   losartan 100 MG tablet Commonly known as:  COZAAR TAKE 1 TABLET BY MOUTH DAILY FOR BLOOD PRESSURE. What changed:    how much to take  how to take this  when to take this  additional instructions   Magnesium Oxide 250 MG Tabs Take 500 mg by mouth daily.   omeprazole 40 MG capsule Commonly known as:  PRILOSEC Take 40 mg by mouth daily.   tamsulosin 0.4 MG Caps capsule Commonly known as:  FLOMAX Take 0.4 mg by mouth daily after breakfast.   tiZANidine 2 MG tablet Commonly known as:  ZANAFLEX Take 1 tablet (2 mg total) by mouth every 6 (six) hours as needed for muscle spasms.   vitamin B-12 1000 MCG tablet Commonly known as:  CYANOCOBALAMIN Take  1 tablet (1,000 mcg total) by mouth daily.   VITAMIN D PO Take 1 tablet by mouth 2 (two) times daily.      Follow-up Information    Unk Pinto, MD. Schedule an appointment as soon as possible for a visit in 5 day(s).   Specialty:  Internal Medicine Why:  To be seen with repeat labs (CBC & BMP). Contact information: [address] [place] [address] [phone]        Frederik Pear, MD. Schedule an appointment as soon as possible for a visit.   Specialty:  Orthopedic Surgery Why:  Please call 05/06/17 for a follow-up appointment. Contact information: [place] [address] [phone]        Franchot Gallo, MD. Schedule an appointment as soon as possible for a visit.   Specialty:  Urology Contact information: [address] [place] [address] [phone]          Allergies  Allergen Reactions  . Iodinated Diagnostic Agents Shortness Of Breath  . Iohexol Shortness Of Breath  . Ace Inhibitors Cough      Procedures/Studies:  Dg Abd 1 View  Result Date: 05/02/2017 CLINICAL DATA:  Abdominal distension, follow-up ileus EXAM: ABDOMEN - 1 VIEW COMPARISON:  05/01/2017 FINDINGS: Mild gaseous distension of small bowel and colon. There is no bowel dilatation to suggest obstruction. There is no evidence of pneumoperitoneum, portal venous gas or pneumatosis. There are no pathologic calcifications along the expected course of the ureters. The osseous structures are unremarkable. IMPRESSION: Mild gaseous distention of small bowel and colon likely reflecting resolving ileus. Electronically Signed   By: Kathreen Devoid   On: 05/02/2017 11:21   US Renal  Result Date: 05/02/2017 CLINICAL DATA:  Sepsis, acute kidney injury. EXAM: RENAL / URINARY TRACT ULTRASOUND COMPLETE COMPARISON:  CT 02/21/2016 FINDINGS: Technically limited exam due to habitus and uncontrolled shaking. Right Kidney: Length: 11.3 cm. Mild increased renal echogenicity. No  mass or hydronephrosis visualized. Left Kidney: Length: 11.2 cm. Mild increased renal echogenicity. 4 cm cyst laterally. Additional cyst in the upper left kidney on prior CT is not well seen sonographically. No shadowing calculi. No solid mass or hydronephrosis visualized. Bladder: Nondistended and not well evaluated, patient recently voided. Incidental note of gallstone within the gallbladder. IMPRESSION: 1. No hydronephrosis or obstructive uropathy. 2. Mild increased renal echogenicity suggesting chronic medical renal disease. 3. Left renal cyst. Electronically Signed   By: Jeb Levering M.D.   On: 05/02/2017 02:51   Dg Chest  Port 1 View  Result Date: 05/03/2017 CLINICAL DATA:  CHF EXAM: PORTABLE CHEST 1 VIEW COMPARISON:  05/02/2017 FINDINGS: Cardiac shadow is stable. Right jugular central line is again seen. Lungs are well aerated without focal infiltrate or sizable effusion. Stable mild prominent central vascularity is noted. No new effusion is seen. No bony abnormality is noted. IMPRESSION: No significant change from the prior exam. Electronically Signed   By: Inez Catalina M.D.   On: 05/03/2017 06:59   Dg Chest Port 1 View  Result Date: 05/02/2017 CLINICAL DATA:  Encounter for central line placement. EXAM: PORTABLE CHEST 1 VIEW COMPARISON:  05/02/2017 at 4:02 a.m. FINDINGS: Right internal jugular central venous line tip projects in the lower superior vena cava. No pneumothorax. Appearance of the lungs is without change from the earlier exam allowing for differences in lung volume and patient positioning. IMPRESSION: 1. New right internal jugular central venous line tip projects in the lower superior vena cava. No pneumothorax. 2. No other change from the earlier study. Electronically Signed   By: Lajean Manes M.D.   On: 05/02/2017 13:35   Dg Chest Portable 1 View  Result Date: 05/02/2017 CLINICAL DATA:  Increasing shortness of breath. EXAM: PORTABLE CHEST 1 VIEW COMPARISON:  Chest radiograph  yesterday. FINDINGS: Lower lung volumes from prior exam leading to bronchovascular crowding, limiting pulmonary vascular assessment. Unchanged mediastinal contours. Heart size upper normal. Streaky bibasilar atelectasis, increasing. No confluent consolidation. No pleural effusion or pneumothorax. IMPRESSION: Low lung volumes with bronchovascular crowding and developing bibasilar atelectasis. Developing vascular congestion is considered, however low lung volumes limits pulmonary vasculature assessment. Prominent heart size likely accentuated by technique. Electronically Signed   By: Jeb Levering M.D.   On: 05/02/2017 04:40   Dg Abd Acute W/chest  Result Date: 05/01/2017 CLINICAL DATA:  Constipation.  Recent left hip surgery. EXAM: DG ABDOMEN ACUTE W/ 1V CHEST COMPARISON:  Chest radiograph 04/12/2017 FINDINGS: The cardiomediastinal contours are normal. The lungs are clear. There is no free intra-abdominal air. Air-filled nondistended transverse, descending, and sigmoid colon. No significant formed stool. Few prominent small bowel loops in the left abdomen with air-fluid levels. No radiopaque calculi. No acute osseous abnormalities are seen. Left hip arthroplasty partially included. IMPRESSION: 1. Air-filled nondistended colon with a few prominent small bowel loops in the left abdomen, suggesting generalized ileus. No increased stool burden to suggest constipation. No free air. 2.  No acute pulmonary process. Electronically Signed   By: Jeb Levering M.D.   On: 05/01/2017 23:52   Dg Hip Unilat With Pelvis 2-3 Views Left  Result Date: 05/02/2017 CLINICAL DATA:  Left hip replacement EXAM: DG HIP (WITH OR WITHOUT PELVIS) 2-3V LEFT COMPARISON:  04/16/2017 FINDINGS: Left total hip arthroplasty without failure or complication. Lateral sideplate and interlocking cerclage wires are present. No fracture or dislocation. Mild osteoarthritis of the right hip. IMPRESSION: 1. Left hip arthroplasty without failure or  complication. Electronically Signed   By: Kathreen Devoid   On: 05/02/2017 11:23     Subjective: Patient anxious to go home.  Denies complaints.  Indicates that his lower extremity and scrotal swelling from fluid resuscitation are slowly improving.  No chest pain, dyspnea, dizziness, lightheadedness, fever, chills reported.  Declines home with Foley catheter, removed and voided x2.  Discharge Exam:  Vitals:   05/04/17 1431 05/04/17 2133 05/05/17 0436 05/05/17 1316  BP: (!) 146/80 (!) 142/79 (!) 149/71 132/76  Pulse: 75 73 77 71  Resp: 18 18 18 18   Temp: 98.4 F (36.9  C) 98.4 F (36.9 C) 98.3 F (36.8 C) 97.9 F (36.6 C)  TempSrc: Oral Oral Oral Oral  SpO2: 99% 98% 97% 100%  Weight:      Height:        General: Pleasant elderly male, moderately built and obese, sitting up comfortably in chair this morning. Cardiovascular: S1 & S2 heard, RRR, S1/S2 +. No murmurs, rubs, gallops or clicks. No JVD.  Trace bilateral ankle edema. Respiratory: Clear to auscultation without wheezing, rhonchi or crackles. No increased work of breathing. Abdominal:  Non distended, non tender & soft. No organomegaly or masses appreciated. Normal bowel sounds heard. CNS: Alert and oriented. No focal deficits. Extremities: no cyanosis.  Left hip healed surgical scar.    The results of significant diagnostics from this hospitalization (including imaging, microbiology, ancillary and laboratory) are listed below for reference.     Microbiology: Recent Results (from the past 240 hour(s))  Blood culture (routine x 2)     Status: None (Preliminary result)   Collection Time: 05/01/17 10:45 PM  Result Value Ref Range Status   Specimen Description BLOOD LEFT ARM  Final   Special Requests   Final    BOTTLES DRAWN AEROBIC AND ANAEROBIC Blood Culture adequate volume   Culture   Final    NO GROWTH 3 DAYS Performed at [facility], [address] [place], [place] [address]    Report Status PENDING   Incomplete  Blood culture (routine x 2)     Status: Abnormal   Collection Time: 05/02/17 12:45 AM  Result Value Ref Range Status   Specimen Description BLOOD RIGHT ARM  Final   Special Requests   Final    BOTTLES DRAWN AEROBIC AND ANAEROBIC Blood Culture adequate volume   Culture  Setup Time   Final    GRAM NEGATIVE RODS IN BOTH AEROBIC AND ANAEROBIC BOTTLES CRITICAL RESULT CALLED TO, READ BACK BY AND VERIFIED WITH: LHAMMONDS,PHARMD @2025  05/02/17 BY LHOWARD Performed at [facility], [place] [address], [place],  [address]    Culture ESCHERICHIA COLI (A)  Final   Report Status 05/04/2017 FINAL  Final   Organism ID, Bacteria ESCHERICHIA COLI  Final      Susceptibility   Escherichia coli - MIC*    AMPICILLIN >=32 RESISTANT Resistant     CEFAZOLIN <=4 SENSITIVE Sensitive     CEFEPIME <=1 SENSITIVE Sensitive     CEFTAZIDIME <=1 SENSITIVE Sensitive     CEFTRIAXONE <=1 SENSITIVE Sensitive     CIPROFLOXACIN <=0.25 SENSITIVE Sensitive     GENTAMICIN <=1 SENSITIVE Sensitive     IMIPENEM <=0.25 SENSITIVE Sensitive     TRIMETH/SULFA <=20 SENSITIVE Sensitive     AMPICILLIN/SULBACTAM >=32 RESISTANT Resistant     PIP/TAZO <=4 SENSITIVE Sensitive     Extended ESBL NEGATIVE Sensitive     * ESCHERICHIA COLI  Blood Culture ID Panel (Reflexed)     Status: Abnormal   Collection Time: 05/02/17 12:45 AM  Result Value Ref Range Status   Enterococcus species NOT DETECTED NOT DETECTED Final   Listeria monocytogenes NOT DETECTED NOT DETECTED Final   Staphylococcus species NOT DETECTED NOT DETECTED Final   Staphylococcus aureus NOT DETECTED NOT DETECTED Final   Streptococcus species NOT DETECTED NOT DETECTED Final   Streptococcus agalactiae NOT DETECTED NOT DETECTED Final   Streptococcus pneumoniae NOT DETECTED NOT DETECTED Final   Streptococcus pyogenes NOT DETECTED NOT DETECTED Final   Acinetobacter baumannii NOT DETECTED NOT DETECTED Final   Enterobacteriaceae species DETECTED (  A) NOT  DETECTED Final    Comment: Enterobacteriaceae represent a large family of gram-negative bacteria, not a single organism. CRITICAL RESULT CALLED TO, READ BACK BY AND VERIFIED WITH: KHAMMONDS,PHARMD @2025  05/02/17 BY LHOWARD    Enterobacter cloacae complex NOT DETECTED NOT DETECTED Final   Escherichia coli DETECTED (A) NOT DETECTED Final    Comment: CRITICAL RESULT CALLED TO, READ BACK BY AND VERIFIED WITH: KHAMMONDS,PHARMD @2025  05/02/17 BY LHOWARD    Klebsiella oxytoca NOT DETECTED NOT DETECTED Final   Klebsiella pneumoniae NOT DETECTED NOT DETECTED Final   Proteus species NOT DETECTED NOT DETECTED Final   Serratia marcescens NOT DETECTED NOT DETECTED Final   Carbapenem resistance NOT DETECTED NOT DETECTED Final   Haemophilus influenzae NOT DETECTED NOT DETECTED Final   Neisseria meningitidis NOT DETECTED NOT DETECTED Final   Pseudomonas aeruginosa NOT DETECTED NOT DETECTED Final   Candida albicans NOT DETECTED NOT DETECTED Final   Candida glabrata NOT DETECTED NOT DETECTED Final   Candida krusei NOT DETECTED NOT DETECTED Final   Candida parapsilosis NOT DETECTED NOT DETECTED Final   Candida tropicalis NOT DETECTED NOT DETECTED Final    Comment: Performed at [facility], [place] [address] [place], [place] [address]  Urine culture     Status: Abnormal   Collection Time: 05/02/17 12:46 AM  Result Value Ref Range Status   Specimen Description URINE, CATHETERIZED  Final   Special Requests   Final    NONE Performed at [facility], [place] [address] [place], [place] [address]    Culture >=100,000 COLONIES/mL ESCHERICHIA COLI (A)  Final   Report Status 05/04/2017 FINAL  Final   Organism ID, Bacteria ESCHERICHIA COLI (A)  Final      Susceptibility   Escherichia coli - MIC*    AMPICILLIN >=32 RESISTANT Resistant     CEFAZOLIN <=4 SENSITIVE Sensitive     CEFTRIAXONE <=1 SENSITIVE Sensitive     CIPROFLOXACIN <=0.25 SENSITIVE Sensitive     GENTAMICIN <=1 SENSITIVE Sensitive      IMIPENEM <=0.25 SENSITIVE Sensitive     NITROFURANTOIN <=16 SENSITIVE Sensitive     TRIMETH/SULFA <=20 SENSITIVE Sensitive     AMPICILLIN/SULBACTAM >=32 RESISTANT Resistant     PIP/TAZO <=4 SENSITIVE Sensitive     Extended ESBL NEGATIVE Sensitive     * >=100,000 COLONIES/mL ESCHERICHIA COLI  MRSA PCR Screening     Status: None   Collection Time: 05/02/17  2:20 PM  Result Value Ref Range Status   MRSA by PCR NEGATIVE NEGATIVE Final    Comment:        The GeneXpert MRSA Assay (FDA approved for NASAL specimens only), is one component of a comprehensive MRSA colonization surveillance program. It is not intended to diagnose MRSA infection nor to guide or monitor treatment for MRSA infections. Performed at [facility], [place] [address], [place],  [address]      Labs: CBC: Recent Labs  Lab 05/01/17 2230 05/02/17 0334 05/03/17 0450 05/04/17 0621 05/05/17 0520  WBC 20.6* 7.9 14.1* 12.3* 8.3  NEUTROABS 18.4* 7.4 12.8* 9.6* 5.4  HGB 8.1* 9.2* 8.0* 8.5* 8.4*  HCT 25.0* 28.4* 24.8* 26.1* 26.1*  MCV 88.7 89.3 89.5 87.6 87.6  PLT 430* 336 354 445* 557*   Basic Metabolic Panel: Recent Labs  Lab 05/01/17 2230 05/02/17 0334 05/03/17 0450 05/04/17 0621  NA 129* 128* 136 139  K 3.4* 3.7 3.5 3.2*  CL 98* 101 106 108  CO2 20* 16* 19* 22  GLUCOSE 160* 139* 143* 105*  BUN 30* 28* 32* 34*  CREATININE 2.23* 2.01* 1.60* 1.44*  CALCIUM 8.3* 7.6* 8.1* 8.5*  MG  --  1.3* 1.9  --    Liver Function Tests: Recent Labs  Lab 05/03/17 0450  AST 36  ALT 17  ALKPHOS 31*  BILITOT 0.6  PROT 5.4*  ALBUMIN 2.3*   Cardiac Enzymes: Recent Labs  Lab 05/02/17 0442 05/02/17 1313 05/02/17 1651  TROPONINI 0.05* 0.24* 0.16*   CBG: Recent Labs  Lab 05/05/17 0009 05/05/17 0426 05/05/17 0814 05/05/17 1158 05/05/17 1648  GLUCAP 108* 106* 115* 186* 141*   Urinalysis    Component Value Date/Time   COLORURINE AMBER (A) 05/02/2017 0046   APPEARANCEUR CLOUDY (A)  05/02/2017 0046   LABSPEC 1.017 05/02/2017 0046   PHURINE 5.0 05/02/2017 0046   GLUCOSEU NEGATIVE 05/02/2017 0046   HGBUR MODERATE (A) 05/02/2017 0046   BILIRUBINUR NEGATIVE 05/02/2017 0046   KETONESUR NEGATIVE 05/02/2017 0046   PROTEINUR 100 (A) 05/02/2017 0046   NITRITE POSITIVE (A) 05/02/2017 0046   LEUKOCYTESUR LARGE (A) 05/02/2017 0046      Time coordinating discharge: Over 30 minutes  SIGNED:  Vernell Leep, MD, FACP, [facility]. [facility] Pager [phone]  If 7PM-7AM, please contact night-coverage [email or web address] Password [facility] 05/05/2017, 5:23 PM

## 2017-05-06 ENCOUNTER — Telehealth: Payer: Self-pay | Admitting: *Deleted

## 2017-05-06 MED ORDER — FUROSEMIDE 20 MG PO TABS: ORAL_TABLET | ORAL | 0 refills | Status: DC

## 2017-05-06 NOTE — Telephone Encounter (Signed)
Patient called and states he has fluid retention since being in the hospital. He asked about an RX for a fluid tablet.  Per Dr Melford Aase, and RX for Lasix 40 mg 1 tablet 1 to 2 times a day.  Patient is aware and will check his BP while on the medication.

## 2017-05-07 LAB — CULTURE, BLOOD (ROUTINE X 2)
Culture: NO GROWTH
Special Requests: ADEQUATE

## 2017-05-11 DIAGNOSIS — S72112D Displaced fracture of greater trochanter of left femur, subsequent encounter for closed fracture with routine healing: Secondary | ICD-10-CM | POA: Diagnosis not present

## 2017-05-18 ENCOUNTER — Telehealth: Payer: Self-pay | Admitting: *Deleted

## 2017-05-18 ENCOUNTER — Other Ambulatory Visit: Payer: Self-pay | Admitting: Internal Medicine

## 2017-05-18 NOTE — Telephone Encounter (Signed)
The  patient was called and reminded he needs a hospital follow up.  Per the patient, he is physically unable to come for an OV at this time.  He has had no medication changes or additions from his hospital encounter. The patient has an appointment on 05/20/2017.

## 2017-05-20 ENCOUNTER — Ambulatory Visit (INDEPENDENT_AMBULATORY_CARE_PROVIDER_SITE_OTHER): Payer: Medicare Other | Admitting: Adult Health

## 2017-05-20 ENCOUNTER — Encounter: Payer: Self-pay | Admitting: Adult Health

## 2017-05-20 ENCOUNTER — Ambulatory Visit: Payer: Self-pay | Admitting: Internal Medicine

## 2017-05-20 VITALS — BP 104/60 | HR 60 | Temp 98.1°F | Ht 71.0 in | Wt 257.0 lb

## 2017-05-20 DIAGNOSIS — E119 Type 2 diabetes mellitus without complications: Secondary | ICD-10-CM

## 2017-05-20 DIAGNOSIS — E871 Hypo-osmolality and hyponatremia: Secondary | ICD-10-CM

## 2017-05-20 DIAGNOSIS — N183 Chronic kidney disease, stage 3 unspecified: Secondary | ICD-10-CM

## 2017-05-20 DIAGNOSIS — E559 Vitamin D deficiency, unspecified: Secondary | ICD-10-CM

## 2017-05-20 DIAGNOSIS — Z79899 Other long term (current) drug therapy: Secondary | ICD-10-CM | POA: Diagnosis not present

## 2017-05-20 DIAGNOSIS — A419 Sepsis, unspecified organism: Secondary | ICD-10-CM

## 2017-05-20 DIAGNOSIS — N390 Urinary tract infection, site not specified: Secondary | ICD-10-CM

## 2017-05-20 DIAGNOSIS — E876 Hypokalemia: Secondary | ICD-10-CM | POA: Diagnosis not present

## 2017-05-20 DIAGNOSIS — N179 Acute kidney failure, unspecified: Secondary | ICD-10-CM | POA: Diagnosis not present

## 2017-05-20 DIAGNOSIS — E782 Mixed hyperlipidemia: Secondary | ICD-10-CM

## 2017-05-20 DIAGNOSIS — I10 Essential (primary) hypertension: Secondary | ICD-10-CM | POA: Diagnosis not present

## 2017-05-20 NOTE — Progress Notes (Signed)
Hospital follow up/3 month OV  Assessment and Plan: Hospital visit follow up for : sepsis secondary to catheterization Hospital discharge meds were reviewed, and reconciled with the patient.   [name] [name] was seen today for hospitalization follow-up and routine follow up.   CKD (chronic kidney disease) stage 3, GFR 30-59 ml/min (HCC) -     BASIC METABOLIC PANEL WITH GFR  AKI (acute kidney injury) ([place]) Resolved as of discharge - will follow up and remove from problem list -     BASIC METABOLIC PANEL WITH GFR  Sepsis secondary to UTI ([place]) Resolved as of discharge -  -     CBC with Differential/Platelet  Hyponatremia -     BASIC METABOLIC PANEL WITH GFR  Hypokalemia -     BASIC METABOLIC PANEL WITH GFR  Diabetes mellitus type II, non insulin dependent ([place]) ?New diagnosis Diabetic diet discussed, information provided on discharge Perform daily foot/skin check, notify office of any concerning changes.  Check A1C Routine 3 month follow up or sooner as indicated- -     Hemoglobin A1c  Mixed hyperlipidemia Continue medications: statin Continue low cholesterol diet and exercise.  Check lipid panel.  -     Lipid panel -     TSH  Medication management -     BASIC METABOLIC PANEL WITH GFR -     CBC with Differential/Platelet -     Hepatic function panel  Essential hypertension Continue medications Monitor blood pressure at home; call if consistently over 130/80 Continue DASH diet.   Reminder to go to the ER if any CP, SOB, nausea, dizziness, severe HA, changes vision/speech, left arm numbness and tingling and jaw pain. -     Magnesium  Vitamin D deficiency -     VITAMIN D 25 Hydroxy (Vit-D Deficiency, Fractures)   There are no discontinued medications.  Over 40 minutes of exam, counseling, chart review, and complex, high/moderate level critical decision making was performed this visit.   Future Appointments  Date Time Provider [place]  10/25/2017  3:00 PM  Unk Pinto, MD [facility] None     HPI BP 104/60   Pulse 60   Temp 98.1 F (36.7 C)   Ht 5\' 11"  (1.803 m)   Wt 257 lb (116.6 kg)   SpO2 97%   BMI 35.84 kg/m   77 y.o.male presents for follow up for transition from recent hospitalization or SNIF stay. Admit date to the hospital was 05/01/17, patient was discharged from the hospital on 05/05/17 and our clinical staff contacted the office the day after discharge to set up a follow up appointment. The discharge summary, medications, and diagnostic test results were reviewed before meeting with the patient. The patient was admitted for:  Septic shock due to catheter associated E. coli UTI and bacteremia. PMH of diet controlled DM 2, HTN, stage III chronic kidney disease, recent left greater trochanteric fracture status post ORIF 04/16/17, known prostate problems, was doing self in and out catheterization at home post hip surgery at the recommendation of his urologist to avoid bladder stretching, presented to ED on 3/3 with generalized weakness, malaise and subjective fevers.  He was admitted for sepsis due to catheter associated UTI.  He was initially improving but then became very hypotensive even after 4 L of IV crystalloid resuscitation and hence required transfer to ICU, pressors and CCM consultation. He was further treated by pressors, IV steroids and zosyn, then on discharge was transitioned to oral keflex. Patient was counseled extensively regarding using  sterile technique for in and out catheterization. Continues with proscar and flomax. While hospitalized, there were some concerns of acute kidney superimposed on stage III CKD for which we will follow up with BMP today; baseline creatinine 1.4 with highest while hospitalized 2.23; he was at baseline at discharge.   He has already follow up with Dr. Mayer Camel and has second follow up this next week. He has not been cleared for PT; will evaluate at the upcoming visit. He reports he has upcoming follow  up for follow up with Dr. Beatrix Fetters for ongoing urinary retention issues.  He is also due for chronic care management:  His blood pressure has been controlled at home, today their BP is BP: 104/60  He does not workout. He denies chest pain, shortness of breath, dizziness.  He is on cholesterol medication and denies myalgias. His cholesterol is not at goal. The cholesterol last visit was:   Lab Results  Component Value Date   CHOL 186 01/19/2017   HDL 49 01/19/2017   LDLCALC 114 (H) 01/19/2017   TRIG 123 01/19/2017   CHOLHDL 3.8 01/19/2017    He has been working on diet and exercise for prediabetes, and denies foot ulcerations, hyperglycemia, increased appetite, nausea, paresthesia of the feet, polydipsia, polyuria, visual disturbances, vomiting and weight loss. Last A1C in the office was:  Lab Results  Component Value Date   HGBA1C 6.8 (H) 04/12/2017   Patient is on Vitamin D supplement.   Lab Results  Component Value Date   VD25OH 66 01/19/2017      Home health is not involved.   Images while in the hospital: Dg Abd 1 View  Result Date: 05/02/2017 CLINICAL DATA:  Abdominal distension, follow-up ileus EXAM: ABDOMEN - 1 VIEW COMPARISON:  05/01/2017 FINDINGS: Mild gaseous distension of small bowel and colon. There is no bowel dilatation to suggest obstruction. There is no evidence of pneumoperitoneum, portal venous gas or pneumatosis. There are no pathologic calcifications along the expected course of the ureters. The osseous structures are unremarkable. IMPRESSION: Mild gaseous distention of small bowel and colon likely reflecting resolving ileus. Electronically Signed   By: Kathreen Devoid   On: 05/02/2017 11:21   US Renal  Result Date: 05/02/2017 CLINICAL DATA:  Sepsis, acute kidney injury. EXAM: RENAL / URINARY TRACT ULTRASOUND COMPLETE COMPARISON:  CT 02/21/2016 FINDINGS: Technically limited exam due to habitus and uncontrolled shaking. Right Kidney: Length: 11.3 cm. Mild increased  renal echogenicity. No mass or hydronephrosis visualized. Left Kidney: Length: 11.2 cm. Mild increased renal echogenicity. 4 cm cyst laterally. Additional cyst in the upper left kidney on prior CT is not well seen sonographically. No shadowing calculi. No solid mass or hydronephrosis visualized. Bladder: Nondistended and not well evaluated, patient recently voided. Incidental note of gallstone within the gallbladder. IMPRESSION: 1. No hydronephrosis or obstructive uropathy. 2. Mild increased renal echogenicity suggesting chronic medical renal disease. 3. Left renal cyst. Electronically Signed   By: Jeb Levering M.D.   On: 05/02/2017 02:51   Result Date: 05/02/2017 CLINICAL DATA:  Increasing shortness of breath. EXAM: PORTABLE CHEST 1 VIEW COMPARISON:  Chest radiograph yesterday. FINDINGS: Lower lung volumes from prior exam leading to bronchovascular crowding, limiting pulmonary vascular assessment. Unchanged mediastinal contours. Heart size upper normal. Streaky bibasilar atelectasis, increasing. No confluent consolidation. No pleural effusion or pneumothorax. IMPRESSION: Low lung volumes with bronchovascular crowding and developing bibasilar atelectasis. Developing vascular congestion is considered, however low lung volumes limits pulmonary vasculature assessment. Prominent heart size likely accentuated by technique.  Electronically Signed   By: Jeb Levering M.D.   On: 05/02/2017 04:40   Dg Hip Unilat With Pelvis 2-3 Views Left  Result Date: 05/02/2017 CLINICAL DATA:  Left hip replacement EXAM: DG HIP (WITH OR WITHOUT PELVIS) 2-3V LEFT COMPARISON:  04/16/2017 FINDINGS: Left total hip arthroplasty without failure or complication. Lateral sideplate and interlocking cerclage wires are present. No fracture or dislocation. Mild osteoarthritis of the right hip. IMPRESSION: 1. Left hip arthroplasty without failure or complication. Electronically Signed   By: Kathreen Devoid   On: 05/02/2017 11:23    Past  Medical History:  Diagnosis Date  . Arthritis    in left hip  . ED (erectile dysfunction)   . Enlarged prostate with lower urinary tract symptoms (LUTS)   . GERD (gastroesophageal reflux disease)   . History of chronic gastritis   . History of colon polyps    hyperplastic 2006  . History of esophageal stricture    S/P  DILATATION 2009; 2010; 2010 2012  . History of kidney stones    hx. multiple kidney stones  . History of kidney stones   . History of primary hyperparathyroidism    s/p  left superior parathyroidectomy 07-10-2014  . Hyperlipidemia   . Hypertension   . Left ureteral stone   . Nephrolithiasis    left side   . Pre-diabetes      Allergies  Allergen Reactions  . Iodinated Diagnostic Agents Shortness Of Breath  . Iohexol Shortness Of Breath  . Ace Inhibitors Cough      Current Outpatient Medications on File Prior to Visit  Medication Sig Dispense Refill  . aspirin EC 325 MG tablet Take 1 tablet (325 mg total) by mouth 2 (two) times daily. 30 tablet 0  . b complex vitamins tablet Take 1 tablet by mouth daily.    . bisoprolol-hydrochlorothiazide (ZIAC) 5-6.25 MG tablet TAKE 1 TABLET BY MOUTH EVERY MORNING FOR BLOOD PRESSURE 90 tablet 4  . Cholecalciferol (VITAMIN D PO) Take 1 tablet by mouth 2 (two) times daily.     . fenofibrate (TRICOR) 145 MG tablet TAKE 1 TABLET BY MOUTH DAILY FOR TRIGLYCERIDES/BLOOD FATS (Patient taking differently: TAKE 1 TABLET (145mg ) BY MOUTH DAILY FOR TRIGLYCERIDES/BLOOD FATS) 90 tablet 3  . finasteride (PROSCAR) 5 MG tablet Take 5 mg by mouth daily.    . Flaxseed, Linseed, (FLAX SEEDS PO) Take 1,000 mg by mouth daily.     Marland Kitchen losartan (COZAAR) 100 MG tablet TAKE 1 TABLET BY MOUTH DAILY FOR BLOOD PRESSURE. (Patient taking differently: TAKE 1 TABLET (100mg ) BY MOUTH DAILY FOR BLOOD PRESSURE.) 90 tablet 3  . Magnesium Oxide 250 MG TABS Take 500 mg by mouth daily.     . Omega-3 Fatty Acids (FISH OIL) 1200 MG CAPS Take 1,200 mg by mouth daily.      Marland Kitchen omeprazole (PRILOSEC) 40 MG capsule Take 40 mg by mouth daily.    . tamsulosin (FLOMAX) 0.4 MG CAPS capsule Take 0.4 mg by mouth daily after breakfast.   3  . vitamin B-12 (CYANOCOBALAMIN) 1000 MCG tablet Take 1 tablet (1,000 mcg total) by mouth daily. 30 tablet 0  . cephALEXin (KEFLEX) 500 MG capsule Take 1 capsule (500 mg total) by mouth 4 (four) times daily. (Patient not taking: Reported on 05/20/2017) 24 capsule 0  . furosemide (LASIX) 20 MG tablet Take 1 tablet 1 to 2 times a day for fluid retention. (Patient not taking: Reported on 05/20/2017) 30 tablet 0  . ibuprofen (ADVIL,MOTRIN) 200 MG  tablet Take 600 mg by mouth every 6 (six) hours as needed for fever or moderate pain.    Marland Kitchen tiZANidine (ZANAFLEX) 2 MG tablet Take 1 tablet (2 mg total) by mouth every 6 (six) hours as needed for muscle spasms. (Patient not taking: Reported on 05/20/2017) 60 tablet 0   No current facility-administered medications on file prior to visit.     ROS: Review of Systems  Constitutional: Negative for malaise/fatigue and weight loss.  HENT: Negative for hearing loss and tinnitus.   Eyes: Negative for blurred vision and double vision.  Respiratory: Negative for cough, shortness of breath and wheezing.   Cardiovascular: Negative for chest pain, palpitations, orthopnea, claudication and leg swelling.  Gastrointestinal: Negative for abdominal pain, blood in stool, constipation, diarrhea, heartburn, melena, nausea and vomiting.  Genitourinary: Negative.   Musculoskeletal: Positive for joint pain. Negative for falls and myalgias.  Skin: Negative for rash.  Neurological: Negative for dizziness, tingling, sensory change, weakness and headaches.  Endo/Heme/Allergies: Negative for polydipsia.  Psychiatric/Behavioral: Negative.   All other systems reviewed and are negative.    Physical Exam: Filed Weights   05/20/17 1345  Weight: 257 lb (116.6 kg)   BP 104/60   Pulse 60   Temp 98.1 F (36.7 C)   Ht 5\' 11"   (1.803 m)   Wt 257 lb (116.6 kg)   SpO2 97%   BMI 35.84 kg/m  General Appearance: Well nourished, in no apparent distress. Eyes: PERRLA, EOMs, conjunctiva no swelling or erythema Sinuses: No Frontal/maxillary tenderness ENT/Mouth: Ext aud canals clear, TMs without erythema, bulging. No erythema, swelling, or exudate on post pharynx.  Tonsils not swollen or erythematous. Hearing normal.  Neck: Supple, thyroid normal.  Respiratory: Respiratory effort normal, BS equal bilaterally without rales, rhonchi, wheezing or stridor.  Cardio: RRR with no MRGs. Brisk peripheral pulses without edema.  Abdomen: Soft, + BS.  Non tender, no guarding. Lymphatics: Non tender without lymphadenopathy.  Musculoskeletal: Slow gait with walker  Skin: Warm, dry without rashes, lesions, ecchymosis.  Neuro: Cranial nerves intact. Normal muscle tone, no cerebellar symptoms. Sensation intact.  Psych: Awake and oriented X 3, normal affect, Insight and Judgment appropriate.    Izora Ribas, NP 2:25 PM [facility]

## 2017-05-20 NOTE — Patient Instructions (Signed)
Diabetes Mellitus and Nutrition When you have diabetes (diabetes mellitus), it is very important to have healthy eating habits because your blood sugar (glucose) levels are greatly affected by what you eat and drink. Eating healthy foods in the appropriate amounts, at about the same times every day, can help you:  Control your blood glucose.  Lower your risk of heart disease.  Improve your blood pressure.  Reach or maintain a healthy weight.  Every person with diabetes is different, and each person has different needs for a meal plan. Your health care provider may recommend that you work with a diet and nutrition specialist (dietitian) to make a meal plan that is best for you. Your meal plan may vary depending on factors such as:  The calories you need.  The medicines you take.  Your weight.  Your blood glucose, blood pressure, and cholesterol levels.  Your activity level.  Other health conditions you have, such as heart or kidney disease.  How do carbohydrates affect me? Carbohydrates affect your blood glucose level more than any other type of food. Eating carbohydrates naturally increases the amount of glucose in your blood. Carbohydrate counting is a method for keeping track of how many carbohydrates you eat. Counting carbohydrates is important to keep your blood glucose at a healthy level, especially if you use insulin or take certain oral diabetes medicines. It is important to know how many carbohydrates you can safely have in each meal. This is different for every person. Your dietitian can help you calculate how many carbohydrates you should have at each meal and for snack. Foods that contain carbohydrates include:  Bread, cereal, rice, pasta, and crackers.  Potatoes and corn.  Peas, beans, and lentils.  Milk and yogurt.  Fruit and juice.  Desserts, such as cakes, cookies, ice cream, and candy.  How does alcohol affect me? Alcohol can cause a sudden decrease in blood  glucose (hypoglycemia), especially if you use insulin or take certain oral diabetes medicines. Hypoglycemia can be a life-threatening condition. Symptoms of hypoglycemia (sleepiness, dizziness, and confusion) are similar to symptoms of having too much alcohol. If your health care provider says that alcohol is safe for you, follow these guidelines:  Limit alcohol intake to no more than 1 drink per day for nonpregnant women and 2 drinks per day for men. One drink equals 12 oz of beer, 5 oz of wine, or 1 oz of hard liquor.  Do not drink on an empty stomach.  Keep yourself hydrated with water, diet soda, or unsweetened iced tea.  Keep in mind that regular soda, juice, and other mixers may contain a lot of sugar and must be counted as carbohydrates.  What are tips for following this plan? Reading food labels  Start by checking the serving size on the label. The amount of calories, carbohydrates, fats, and other nutrients listed on the label are based on one serving of the food. Many foods contain more than one serving per package.  Check the total grams (g) of carbohydrates in one serving. You can calculate the number of servings of carbohydrates in one serving by dividing the total carbohydrates by 15. For example, if a food has 30 g of total carbohydrates, it would be equal to 2 servings of carbohydrates.  Check the number of grams (g) of saturated and trans fats in one serving. Choose foods that have low or no amount of these fats.  Check the number of milligrams (mg) of sodium in one serving. Most people   should limit total sodium intake to less than 2,300 mg per day.  Always check the nutrition information of foods labeled as "low-fat" or "nonfat". These foods may be higher in added sugar or refined carbohydrates and should be avoided.  Talk to your dietitian to identify your daily goals for nutrients listed on the label. Shopping  Avoid buying canned, premade, or processed foods. These  foods tend to be high in fat, sodium, and added sugar.  Shop around the outside edge of the grocery store. This includes fresh fruits and vegetables, bulk grains, fresh meats, and fresh dairy. Cooking  Use low-heat cooking methods, such as baking, instead of high-heat cooking methods like deep frying.  Cook using healthy oils, such as olive, canola, or sunflower oil.  Avoid cooking with butter, cream, or high-fat meats. Meal planning  Eat meals and snacks regularly, preferably at the same times every day. Avoid going long periods of time without eating.  Eat foods high in fiber, such as fresh fruits, vegetables, beans, and whole grains. Talk to your dietitian about how many servings of carbohydrates you can eat at each meal.  Eat 4-6 ounces of lean protein each day, such as lean meat, chicken, fish, eggs, or tofu. 1 ounce is equal to 1 ounce of meat, chicken, or fish, 1 egg, or 1/4 cup of tofu.  Eat some foods each day that contain healthy fats, such as avocado, nuts, seeds, and fish. Lifestyle   Check your blood glucose regularly.  Exercise at least 30 minutes 5 or more days each week, or as told by your health care provider.  Take medicines as told by your health care provider.  Do not use any products that contain nicotine or tobacco, such as cigarettes and e-cigarettes. If you need help quitting, ask your health care provider.  Work with a counselor or diabetes educator to identify strategies to manage stress and any emotional and social challenges. What are some questions to ask my health care provider?  Do I need to meet with a diabetes educator?  Do I need to meet with a dietitian?  What number can I call if I have questions?  When are the best times to check my blood glucose? Where to find more information:  American Diabetes Association: diabetes.org/food-and-fitness/food  Academy of Nutrition and Dietetics:  www.eatright.org/resources/health/diseases-and-conditions/diabetes  National Institute of Diabetes and Digestive and Kidney Diseases (NIH): www.niddk.nih.gov/health-information/diabetes/overview/diet-eating-physical-activity Summary  A healthy meal plan will help you control your blood glucose and maintain a healthy lifestyle.  Working with a diet and nutrition specialist (dietitian) can help you make a meal plan that is best for you.  Keep in mind that carbohydrates and alcohol have immediate effects on your blood glucose levels. It is important to count carbohydrates and to use alcohol carefully. This information is not intended to replace advice given to you by your health care provider. Make sure you discuss any questions you have with your health care provider. Document Released: 11/13/2004 Document Revised: 03/23/2016 Document Reviewed: 03/23/2016 Elsevier Interactive Patient Education  2018 [employer]  

## 2017-05-21 LAB — HEMOGLOBIN A1C
Hgb A1c MFr Bld: 6 % of total Hgb — ABNORMAL HIGH (ref <5.7)
Mean Plasma Glucose: 126 (calc)
eAG (mmol/L): 7 (calc)

## 2017-05-21 LAB — BASIC METABOLIC PANEL WITH GFR
BUN/Creatinine Ratio: 15 (calc) (ref 6–22)
BUN: 25 mg/dL (ref 7–25)
CO2: 25 mmol/L (ref 20–32)
Calcium: 9.8 mg/dL (ref 8.6–10.3)
Chloride: 101 mmol/L (ref 98–110)
Creat: 1.7 mg/dL — ABNORMAL HIGH (ref 0.70–1.18)
GFR, Est African American: 45 mL/min/{1.73_m2} — ABNORMAL LOW (ref >=60)
GFR, Est Non African American: 39 mL/min/{1.73_m2} — ABNORMAL LOW (ref >=60)
Glucose, Bld: 100 mg/dL — ABNORMAL HIGH (ref 65–99)
Potassium: 5.2 mmol/L (ref 3.5–5.3)
Sodium: 137 mmol/L (ref 135–146)

## 2017-05-21 LAB — CBC WITH DIFFERENTIAL/PLATELET
Basophils Absolute: 71 cells/uL (ref 0–200)
Basophils Relative: 1.2 %
Eosinophils Absolute: 401 cells/uL (ref 15–500)
Eosinophils Relative: 6.8 %
HCT: 32.2 % — ABNORMAL LOW (ref 38.5–50.0)
Hemoglobin: 10.6 g/dL — ABNORMAL LOW (ref 13.2–17.1)
Lymphs Abs: 1528 cells/uL (ref 850–3900)
MCH: 27.7 pg (ref 27.0–33.0)
MCHC: 32.9 g/dL (ref 32.0–36.0)
MCV: 84.3 fL (ref 80.0–100.0)
MPV: 10.3 fL (ref 7.5–12.5)
Monocytes Relative: 10.2 %
Neutro Abs: 3298 cells/uL (ref 1500–7800)
Neutrophils Relative %: 55.9 %
Platelets: 466 10*3/uL — ABNORMAL HIGH (ref 140–400)
RBC: 3.82 10*6/uL — ABNORMAL LOW (ref 4.20–5.80)
RDW: 14.2 % (ref 11.0–15.0)
Total Lymphocyte: 25.9 %
WBC mixed population: 602 cells/uL (ref 200–950)
WBC: 5.9 10*3/uL (ref 3.8–10.8)

## 2017-05-21 LAB — LIPID PANEL
Cholesterol: 162 mg/dL (ref <200)
HDL: 46 mg/dL (ref >40)
LDL Cholesterol (Calc): 95 mg/dL (calc)
Non-HDL Cholesterol (Calc): 116 mg/dL (calc) (ref <130)
Total CHOL/HDL Ratio: 3.5 (calc) (ref <5.0)
Triglycerides: 115 mg/dL (ref <150)

## 2017-05-21 LAB — TSH: TSH: 2.33 mIU/L (ref 0.40–4.50)

## 2017-05-21 LAB — HEPATIC FUNCTION PANEL
AG Ratio: 1.2 (calc) (ref 1.0–2.5)
ALT: 10 U/L (ref 9–46)
AST: 18 U/L (ref 10–35)
Albumin: 3.9 g/dL (ref 3.6–5.1)
Alkaline phosphatase (APISO): 46 U/L (ref 40–115)
Bilirubin, Direct: 0.1 mg/dL (ref 0.0–0.2)
Globulin: 3.2 g/dL (calc) (ref 1.9–3.7)
Indirect Bilirubin: 0.5 mg/dL (calc) (ref 0.2–1.2)
Total Bilirubin: 0.6 mg/dL (ref 0.2–1.2)
Total Protein: 7.1 g/dL (ref 6.1–8.1)

## 2017-05-21 LAB — VITAMIN D 25 HYDROXY (VIT D DEFICIENCY, FRACTURES): Vit D, 25-Hydroxy: 60 ng/mL (ref 30–100)

## 2017-05-21 LAB — MAGNESIUM: Magnesium: 1.7 mg/dL (ref 1.5–2.5)

## 2017-05-25 DIAGNOSIS — M25552 Pain in left hip: Secondary | ICD-10-CM | POA: Diagnosis not present

## 2017-06-08 DIAGNOSIS — Z9889 Other specified postprocedural states: Secondary | ICD-10-CM | POA: Diagnosis not present

## 2017-06-10 DIAGNOSIS — R262 Difficulty in walking, not elsewhere classified: Secondary | ICD-10-CM | POA: Diagnosis not present

## 2017-06-10 DIAGNOSIS — M25552 Pain in left hip: Secondary | ICD-10-CM | POA: Diagnosis not present

## 2017-06-16 DIAGNOSIS — R262 Difficulty in walking, not elsewhere classified: Secondary | ICD-10-CM | POA: Diagnosis not present

## 2017-06-16 DIAGNOSIS — M25552 Pain in left hip: Secondary | ICD-10-CM | POA: Diagnosis not present

## 2017-06-21 DIAGNOSIS — R262 Difficulty in walking, not elsewhere classified: Secondary | ICD-10-CM | POA: Diagnosis not present

## 2017-06-21 DIAGNOSIS — M25552 Pain in left hip: Secondary | ICD-10-CM | POA: Diagnosis not present

## 2017-06-24 DIAGNOSIS — R262 Difficulty in walking, not elsewhere classified: Secondary | ICD-10-CM | POA: Diagnosis not present

## 2017-06-24 DIAGNOSIS — M25552 Pain in left hip: Secondary | ICD-10-CM | POA: Diagnosis not present

## 2017-06-28 DIAGNOSIS — R262 Difficulty in walking, not elsewhere classified: Secondary | ICD-10-CM | POA: Diagnosis not present

## 2017-06-28 DIAGNOSIS — M25552 Pain in left hip: Secondary | ICD-10-CM | POA: Diagnosis not present

## 2017-06-29 DIAGNOSIS — Z09 Encounter for follow-up examination after completed treatment for conditions other than malignant neoplasm: Secondary | ICD-10-CM | POA: Diagnosis not present

## 2017-06-29 DIAGNOSIS — Z96642 Presence of left artificial hip joint: Secondary | ICD-10-CM | POA: Diagnosis not present

## 2017-06-29 DIAGNOSIS — M25552 Pain in left hip: Secondary | ICD-10-CM | POA: Diagnosis not present

## 2017-06-30 DIAGNOSIS — M25552 Pain in left hip: Secondary | ICD-10-CM | POA: Diagnosis not present

## 2017-06-30 DIAGNOSIS — R262 Difficulty in walking, not elsewhere classified: Secondary | ICD-10-CM | POA: Diagnosis not present

## 2017-07-02 DIAGNOSIS — N401 Enlarged prostate with lower urinary tract symptoms: Secondary | ICD-10-CM | POA: Diagnosis not present

## 2017-07-05 DIAGNOSIS — R262 Difficulty in walking, not elsewhere classified: Secondary | ICD-10-CM | POA: Diagnosis not present

## 2017-07-05 DIAGNOSIS — M25552 Pain in left hip: Secondary | ICD-10-CM | POA: Diagnosis not present

## 2017-07-07 DIAGNOSIS — M25552 Pain in left hip: Secondary | ICD-10-CM | POA: Diagnosis not present

## 2017-07-07 DIAGNOSIS — R262 Difficulty in walking, not elsewhere classified: Secondary | ICD-10-CM | POA: Diagnosis not present

## 2017-07-12 DIAGNOSIS — R262 Difficulty in walking, not elsewhere classified: Secondary | ICD-10-CM | POA: Diagnosis not present

## 2017-07-12 DIAGNOSIS — M25552 Pain in left hip: Secondary | ICD-10-CM | POA: Diagnosis not present

## 2017-07-14 DIAGNOSIS — R262 Difficulty in walking, not elsewhere classified: Secondary | ICD-10-CM | POA: Diagnosis not present

## 2017-07-14 DIAGNOSIS — M25552 Pain in left hip: Secondary | ICD-10-CM | POA: Diagnosis not present

## 2017-07-23 DIAGNOSIS — M25552 Pain in left hip: Secondary | ICD-10-CM | POA: Diagnosis not present

## 2017-07-23 DIAGNOSIS — R262 Difficulty in walking, not elsewhere classified: Secondary | ICD-10-CM | POA: Diagnosis not present

## 2017-07-27 DIAGNOSIS — R262 Difficulty in walking, not elsewhere classified: Secondary | ICD-10-CM | POA: Diagnosis not present

## 2017-07-27 DIAGNOSIS — M25552 Pain in left hip: Secondary | ICD-10-CM | POA: Diagnosis not present

## 2017-07-28 ENCOUNTER — Other Ambulatory Visit: Payer: Self-pay | Admitting: Internal Medicine

## 2017-07-29 DIAGNOSIS — M25552 Pain in left hip: Secondary | ICD-10-CM | POA: Diagnosis not present

## 2017-07-29 DIAGNOSIS — R262 Difficulty in walking, not elsewhere classified: Secondary | ICD-10-CM | POA: Diagnosis not present

## 2017-08-09 DIAGNOSIS — M25552 Pain in left hip: Secondary | ICD-10-CM | POA: Diagnosis not present

## 2017-08-09 DIAGNOSIS — R262 Difficulty in walking, not elsewhere classified: Secondary | ICD-10-CM | POA: Diagnosis not present

## 2017-08-11 DIAGNOSIS — M25552 Pain in left hip: Secondary | ICD-10-CM | POA: Diagnosis not present

## 2017-08-11 DIAGNOSIS — R262 Difficulty in walking, not elsewhere classified: Secondary | ICD-10-CM | POA: Diagnosis not present

## 2017-08-16 ENCOUNTER — Other Ambulatory Visit: Payer: Self-pay | Admitting: Internal Medicine

## 2017-08-16 DIAGNOSIS — M25552 Pain in left hip: Secondary | ICD-10-CM | POA: Diagnosis not present

## 2017-08-16 DIAGNOSIS — R262 Difficulty in walking, not elsewhere classified: Secondary | ICD-10-CM | POA: Diagnosis not present

## 2017-08-20 DIAGNOSIS — M25552 Pain in left hip: Secondary | ICD-10-CM | POA: Diagnosis not present

## 2017-08-20 DIAGNOSIS — R262 Difficulty in walking, not elsewhere classified: Secondary | ICD-10-CM | POA: Diagnosis not present

## 2017-09-06 ENCOUNTER — Telehealth: Payer: Self-pay | Admitting: *Deleted

## 2017-09-06 ENCOUNTER — Other Ambulatory Visit: Payer: Self-pay | Admitting: Internal Medicine

## 2017-09-06 MED ORDER — MELOXICAM 15 MG PO TABS: ORAL_TABLET | ORAL | 0 refills | Status: DC

## 2017-09-06 NOTE — Telephone Encounter (Signed)
Patient called and complained of arthritis pain. Dr Melford Aase sent a refill for Meloxicam 15 mg. The patient was advised he cannot take 2 tablets, but can take Tylenol.  Also informed he should not NSAIDS with Meloxicam.

## 2017-09-20 ENCOUNTER — Encounter: Payer: Self-pay | Admitting: Adult Health

## 2017-09-20 NOTE — Progress Notes (Deleted)
MEDICARE ANNUAL WELLNESS VISIT AND FOLLOW UP Assessment:    Medicare annual wellness visit   Essential hypertension - continue medications, DASH diet, exercise and monitor at home. Call if greater than 130/80.  - CBC with Differential/Platelet - BASIC METABOLIC PANEL WITH GFR - TSH - Hepatic function panel   Morbid obesity, unspecified obesity type (HCC)/ snoring Obesity with co morbidities- long discussion about weight loss, diet, and exercise HAS NOT HAD SLEEP STUDY YET BUT VERY LIKELY HAS OSA GET SLEEP STUDY ***  Hyperparathyroidism, primary ([place]) S/p surgical resection, monitor - BASIC METABOLIC PANEL WITH GFR  Prediabetes - Hemoglobin A1c  Hyperlipidemia -continue medications, check lipids, decrease fatty foods, increase activity.  - Lipid panel  Medication management - Magnesium  Vitamin D deficiency - VITAMIN D 25 Hydroxy (Vit-D Deficiency, Fractures)   Kidney stones Follow up urology PRN CMP/GFR   Stricture and stenosis of esophagus Continue PPI, followed by GI  Gastroesophageal reflux disease, esophagitis presence not specified Continue PPI  CKD (chronic kidney disease) stage 3, GFR 30-59 ml/min Increase fluids, avoid NSAIDS, monitor sugars, will monitor - BASIC METABOLIC PANEL WITH GFR  Normocytic anemia/vitamin B12 def Continue supplement, monitor CBC, check B12 at CPE  Hip arthritis S/p surgical repair, ***  BPH  Continue flomax, finasteride ***  Over 30 minutes of exam, counseling, chart review, and critical decision making was performed  Future Appointments  Date Time Provider [place]  09/21/2017  3:30 PM Liane Comber, NP [facility] None  12/28/2017  3:45 PM Unk Pinto, MD [facility] None     Plan:   During the course of the visit the patient was educated and counseled about appropriate screening and preventive services including:    Pneumococcal vaccine   Influenza vaccine  Prevnar 13  Td  vaccine  Screening electrocardiogram  Colorectal cancer screening  Diabetes screening  Glaucoma screening  Nutrition counseling    Subjective:  [name] [name] is a 77 y.o. male who presents for Medicare Annual Wellness Visit and 3 month follow up for HTN, hyperlipidemia, prediabetes, and vitamin D Def. In May 2016, he underwent a L Superior Parathyroidectomy. He has had lithotripsy for recurrent kidney stones. Patient underwent repeat Lt Hip arthroplasty in April 2018 by Dr Wynelle Link for failed hip arthroplasty. He has hx of chronic GERD, on PPI with hx of multiple dilations x 3, last in 2012.   BMI is There is no height or weight on file to calculate BMI., he {HAS HAS [id number]} been working on diet and exercise. Wt Readings from Last 3 Encounters:  05/20/17 257 lb (116.6 kg)  05/03/17 272 lb 0.8 oz (123.4 kg)  04/16/17 263 lb (119.3 kg)   His blood pressure has been controlled at home, today their BP is   He does not workout. He denies chest pain, shortness of breath, dizziness.   He is on cholesterol medication and has some myalgias with fenofibrate. His cholesterol is at goal. The cholesterol last visit was:   Lab Results  Component Value Date   CHOL 162 05/20/2017   HDL 46 05/20/2017   LDLCALC 95 05/20/2017   TRIG 115 05/20/2017   CHOLHDL 3.5 05/20/2017   He has been working on diet and exercise for prediabetes, and denies paresthesia of the feet, polydipsia, polyuria and visual disturbances. Last A1C in the office was:  Lab Results  Component Value Date   HGBA1C 6.0 (H) 05/20/2017   Last GFR  Lab Results  Component Value Date   GFRNONAA 39 (L) 05/20/2017  Patient is on Vitamin D supplement.   Lab Results  Component Value Date   VD25OH 60 05/20/2017       Medication Review: Current Outpatient Medications on File Prior to Visit  Medication Sig Dispense Refill  . aspirin EC 325 MG tablet Take 1 tablet (325 mg total) by mouth 2 (two) times daily. 30 tablet 0  .  b complex vitamins tablet Take 1 tablet by mouth daily.    . bisoprolol-hydrochlorothiazide (ZIAC) 5-6.25 MG tablet TAKE 1 TABLET BY MOUTH EVERY MORNING FOR BLOOD PRESSURE 90 tablet 4  . Cholecalciferol (VITAMIN D PO) Take 1 tablet by mouth 2 (two) times daily.     . fenofibrate (TRICOR) 145 MG tablet TAKE 1 TABLET BY MOUTH DAILY FOR TRIGLYCERIDES/BLOOD FATS 90 tablet 3  . finasteride (PROSCAR) 5 MG tablet Take 5 mg by mouth daily.    . Flaxseed, Linseed, (FLAX SEEDS PO) Take 1,000 mg by mouth daily.     Marland Kitchen ibuprofen (ADVIL,MOTRIN) 200 MG tablet Take 600 mg by mouth every 6 (six) hours as needed for fever or moderate pain.    Marland Kitchen losartan (COZAAR) 100 MG tablet TAKE 1 TABLET BY MOUTH DAILY FOR BLOOD PRESSURE. (Patient taking differently: TAKE 1 TABLET (100mg ) BY MOUTH DAILY FOR BLOOD PRESSURE.) 90 tablet 3  . Magnesium Oxide 250 MG TABS Take 500 mg by mouth daily.     . meloxicam (MOBIC) 15 MG tablet Take 1/2 to 1 tablet daily with food for pain & Inflammation 30 tablet 0  . Omega-3 Fatty Acids (FISH OIL) 1200 MG CAPS Take 1,200 mg by mouth daily.     Marland Kitchen omeprazole (PRILOSEC) 40 MG capsule Take 40 mg by mouth daily.    Marland Kitchen omeprazole (PRILOSEC) 40 MG capsule TAKE 1 CAPSULE BY MOUTH DAILY. 90 capsule 1  . tamsulosin (FLOMAX) 0.4 MG CAPS capsule Take 0.4 mg by mouth daily after breakfast.   3  . tiZANidine (ZANAFLEX) 2 MG tablet Take 1 tablet (2 mg total) by mouth every 6 (six) hours as needed for muscle spasms. (Patient not taking: Reported on 05/20/2017) 60 tablet 0  . vitamin B-12 (CYANOCOBALAMIN) 1000 MCG tablet Take 1 tablet (1,000 mcg total) by mouth daily. 30 tablet 0   No current facility-administered medications on file prior to visit.     Current Problems (verified) Patient Active Problem List   Diagnosis Date Noted  . Normocytic anemia 05/02/2017  . Vitamin B12 deficiency 05/02/2017  . OA (osteoarthritis) of hip 06/24/2016  . CKD (chronic kidney disease) stage 3, GFR 30-59 ml/min (HCC)  03/25/2015  . Snoring 03/25/2015  . Encounter for Medicare annual wellness exam 11/27/2014  . Hyperparathyroidism, primary ([place]) 07/09/2014  . Vitamin D deficiency 06/13/2013  . Prediabetes 06/13/2013  . Medication management 06/13/2013  . Hyperlipidemia   . Hypertension   . GERD (gastroesophageal reflux disease)   . Kidney stones   . Morbidly obese ([place])   . Stricture and stenosis of esophagus 10/18/2008    Screening Tests Immunization History  Administered Date(s) Administered  . Influenza Split 12/27/2012  . Influenza, High Dose Seasonal PF 01/31/2014, 11/27/2014  . Influenza-Unspecified 01/12/2017  . Pneumococcal Conjugate-13 03/25/2015  . Pneumococcal Polysaccharide-23 03/02/2008  . Td 03/02/2005, 09/24/2015  . Zoster 03/02/2006   Preventative care: Last colonoscopy: 01/2017, 3 year follow up recommended EGD 2012 MGM 2013  Prior vaccinations: TD or Tdap: 2017 Influenza: 2018  Pneumococcal: 2010 Prevnar13: 2017 Shingles/Zostavax: 2008  Names of Other Physician/Practitioners you currently use: 1. [facility] here for primary care 2. Dr. Katy Fitch, eye doctor, last visit 11/2014 3. Dr. Zachery Dakins, dentist, last visit 01/2016  Patient Care Team: Unk Pinto, MD as PCP - General (Internal Medicine) Newt Minion, MD as Consulting Physician (Orthopedic Surgery) Inda Castle, MD (Inactive) as Consulting Physician (Gastroenterology) Franchot Gallo, MD as Consulting Physician (Urology)  Allergies Allergies  Allergen Reactions  . Iodinated Diagnostic Agents Shortness Of Breath  . Iohexol Shortness Of Breath  . Ace Inhibitors Cough    SURGICAL HISTORY He  has a past surgical history that includes tumor ear (Left, age 43); Cystoscopy w/ ureteroscopy w/ lithotripsy (Left, 05/04/2005); Colonoscopy (02/17/2005); Nephrolithotomy (Left, 02/01/2014); Parathyroidectomy (Left, 07/10/2014); CYSTO/  LEFT RETROGRADE PYELOGRAM/ STENT  PLACEMENT (01/26/2005); Esophagogastroduodenoscopy (egd) with esophageal dilation (x4  last one 07-21-2010); Inguinal hernia repair (Left, yrs ago); Cystoscopy/retrograde/ureteroscopy/stone extraction with basket (Left, 12/30/2015); Cystoscopy with stent placement (Left, 12/30/2015); Holmium laser application (Left, 35/36/1443); Cystoscopy/ureteroscopy/holmium laser/stent placement (Left, 03/09/2016); Total hip arthroplasty (Left, 06/24/2016); Total hip revision (Left, 11/18/2016); and ORIF hip fracture (Left, 04/16/2017). FAMILY HISTORY His family history includes Alzheimer's disease in his mother; Bladder Cancer in his sister; Heart disease in his father and mother. SOCIAL HISTORY He  reports that he quit smoking about 43 years ago. He quit after 8.00 years of use. He has never used smokeless tobacco. He reports that he drinks about 0.6 - 1.2 oz of alcohol per week. He reports that he does not use drugs.  MEDICARE WELLNESS OBJECTIVES: Physical activity:   Cardiac risk factors:   Depression/mood screen:   Depression screen [facility] 2/9 09/28/2016  Decreased Interest 0  Down, Depressed, Hopeless 0  PHQ - 2 Score 0    ADLs:  In your present state of health, do you have any difficulty performing the following activities: 05/04/2017 04/12/2017  Hearing? N N  Vision? Y Y  Difficulty concentrating or making decisions? N N  Walking or climbing stairs? N N  Dressing or bathing? N N  Doing errands, shopping? N N  Some recent data might be hidden     Cognitive Testing  Alert? Yes  Normal Appearance?Yes  Oriented to person? Yes  Place? Yes   Time? Yes  Recall of three objects?  Yes  Can perform simple calculations? Yes  Displays appropriate judgment?Yes  Can read the correct time from a watch face?Yes  EOL planning:     Objective:   There were no vitals filed for this visit. There is no height or weight on file to calculate BMI.  General appearance: alert, no distress, WD/WN, male HEENT:  normocephalic, sclerae anicteric, TMs pearly, nares patent, no discharge or erythema, pharynx normal Oral cavity: MMM, no lesions, crowded mouth Neck: supple, large neck circumference, no lymphadenopathy, no thyromegaly, no masses Heart: RRR, normal S1, S2, no murmurs Lungs: CTA bilaterally, no wheezes, rhonchi, or rales Abdomen: +bs, soft, obese, non tender, non distended, no masses, no hepatomegaly, no splenomegaly Musculoskeletal: nontender, no swelling, no obvious deformity Extremities: no edema, no cyanosis, no clubbing Pulses: 2+ symmetric, upper and lower extremities, normal cap refill Neurological: alert, oriented x 3, CN2-12 intact, strength normal upper extremities and lower extremities, sensation normal throughout, DTRs 2+ throughout, no cerebellar signs, gait antalgic Psychiatric: normal affect, behavior normal, pleasant   Medicare Attestation I have personally reviewed: The patient's medical and social history Their use of alcohol, tobacco or illicit drugs Their current medications and supplements The patient's functional ability including ADLs,fall risks, home safety risks, cognitive, and hearing  and visual impairment Diet and physical activities Evidence for depression or mood disorders  The patient's weight, height, BMI, and visual acuity have been recorded in the chart.  I have made referrals, counseling, and provided education to the patient based on review of the above and I have provided the patient with a written personalized care plan for preventive services.     Izora Ribas, NP   09/20/2017

## 2017-09-21 ENCOUNTER — Ambulatory Visit: Payer: Self-pay | Admitting: Adult Health

## 2017-09-30 ENCOUNTER — Ambulatory Visit: Payer: Self-pay | Admitting: Internal Medicine

## 2017-10-05 DIAGNOSIS — M25552 Pain in left hip: Secondary | ICD-10-CM | POA: Diagnosis not present

## 2017-10-11 NOTE — Progress Notes (Deleted)
MEDICARE ANNUAL WELLNESS VISIT AND FOLLOW UP Assessment:    Medicare annual wellness visit   Essential hypertension - continue medications, DASH diet, exercise and monitor at home. Call if greater than 130/80.  - CBC with Differential/Platelet - CMP WITH GFR - TSH   Morbid obesity, unspecified obesity type (HCC)/ snoring Obesity with co morbidities- long discussion about weight loss, diet, and exercise HAS NOT HAD SLEEP STUDY YET BUT VERY LIKELY HAS OSA GET SLEEP STUDY ***  Hyperparathyroidism, primary ([place]) S/p surgical resection, monitor - CMP WITH GFR  Prediabetes - Hemoglobin A1c  Hyperlipidemia -continue medications, check lipids, decrease fatty foods, increase activity.  - Lipid panel  Medication management - Magnesium  Vitamin D deficiency - VITAMIN D 25 Hydroxy (Vit-D Deficiency, Fractures)   Kidney stones Follow up urology PRN CMP/GFR   Stricture and stenosis of esophagus Continue PPI, followed by GI  Gastroesophageal reflux disease, esophagitis presence not specified Continue PPI  CKD (chronic kidney disease) stage 3, GFR 30-59 ml/min Increase fluids, avoid NSAIDS, monitor sugars, will monitor - CMP/ GFR  Normocytic anemia/vitamin B12 def Continue supplement, monitor CBC, check B12 at CPE  Hip arthritis S/p surgical repair, ***  BPH  Continue flomax, finasteride ***  Over 30 minutes of exam, counseling, chart review, and critical decision making was performed  Future Appointments  Date Time Provider [place]  10/12/2017  4:15 PM Liane Comber, NP [facility] None  12/28/2017  3:45 PM Unk Pinto, MD [facility] None     Plan:   During the course of the visit the patient was educated and counseled about appropriate screening and preventive services including:    Pneumococcal vaccine   Influenza vaccine  Prevnar 13  Td vaccine  Screening electrocardiogram  Colorectal cancer screening  Diabetes screening  Glaucoma  screening  Nutrition counseling    Subjective:  [name] [name] is a 77 y.o. male who presents for Medicare Annual Wellness Visit and 3 month follow up for HTN, hyperlipidemia, prediabetes, and vitamin D Def. In May 2016, he underwent a L Superior Parathyroidectomy. He has had lithotripsy for recurrent kidney stones. Patient underwent repeat Lt Hip arthroplasty in April 2018 by Dr Wynelle Link for failed hip arthroplasty. He has hx of chronic GERD, on PPI with hx of multiple dilations x 3, last in 2012.   BMI is There is no height or weight on file to calculate BMI., he {HAS HAS [id number]} been working on diet and exercise. Wt Readings from Last 3 Encounters:  05/20/17 257 lb (116.6 kg)  05/03/17 272 lb 0.8 oz (123.4 kg)  04/16/17 263 lb (119.3 kg)   His blood pressure has been controlled at home, today their BP is   He does not workout. He denies chest pain, shortness of breath, dizziness.   He is on cholesterol medication (fenofibrate) and denies myalgias. His cholesterol is at goal. The cholesterol last visit was:   Lab Results  Component Value Date   CHOL 162 05/20/2017   HDL 46 05/20/2017   LDLCALC 95 05/20/2017   TRIG 115 05/20/2017   CHOLHDL 3.5 05/20/2017   He has been working on diet and exercise for prediabetes, and denies paresthesia of the feet, polydipsia, polyuria and visual disturbances. Last A1C in the office was:  Lab Results  Component Value Date   HGBA1C 6.0 (H) 05/20/2017   Last GFR  Lab Results  Component Value Date   GFRNONAA 39 (L) 05/20/2017   Patient is on Vitamin D supplement.   Lab Results  Component Value Date   VD25OH 60 05/20/2017       Medication Review: Current Outpatient Medications on File Prior to Visit  Medication Sig Dispense Refill  . aspirin EC 325 MG tablet Take 1 tablet (325 mg total) by mouth 2 (two) times daily. 30 tablet 0  . b complex vitamins tablet Take 1 tablet by mouth daily.    . bisoprolol-hydrochlorothiazide (ZIAC) 5-6.25 MG  tablet TAKE 1 TABLET BY MOUTH EVERY MORNING FOR BLOOD PRESSURE 90 tablet 4  . Cholecalciferol (VITAMIN D PO) Take 1 tablet by mouth 2 (two) times daily.     . fenofibrate (TRICOR) 145 MG tablet TAKE 1 TABLET BY MOUTH DAILY FOR TRIGLYCERIDES/BLOOD FATS 90 tablet 3  . finasteride (PROSCAR) 5 MG tablet Take 5 mg by mouth daily.    . Flaxseed, Linseed, (FLAX SEEDS PO) Take 1,000 mg by mouth daily.     Marland Kitchen ibuprofen (ADVIL,MOTRIN) 200 MG tablet Take 600 mg by mouth every 6 (six) hours as needed for fever or moderate pain.    Marland Kitchen losartan (COZAAR) 100 MG tablet TAKE 1 TABLET BY MOUTH DAILY FOR BLOOD PRESSURE. (Patient taking differently: TAKE 1 TABLET (100mg ) BY MOUTH DAILY FOR BLOOD PRESSURE.) 90 tablet 3  . Magnesium Oxide 250 MG TABS Take 500 mg by mouth daily.     . meloxicam (MOBIC) 15 MG tablet Take 1/2 to 1 tablet daily with food for pain & Inflammation 30 tablet 0  . Omega-3 Fatty Acids (FISH OIL) 1200 MG CAPS Take 1,200 mg by mouth daily.     Marland Kitchen omeprazole (PRILOSEC) 40 MG capsule Take 40 mg by mouth daily.    Marland Kitchen omeprazole (PRILOSEC) 40 MG capsule TAKE 1 CAPSULE BY MOUTH DAILY. 90 capsule 1  . tamsulosin (FLOMAX) 0.4 MG CAPS capsule Take 0.4 mg by mouth daily after breakfast.   3  . tiZANidine (ZANAFLEX) 2 MG tablet Take 1 tablet (2 mg total) by mouth every 6 (six) hours as needed for muscle spasms. (Patient not taking: Reported on 05/20/2017) 60 tablet 0  . vitamin B-12 (CYANOCOBALAMIN) 1000 MCG tablet Take 1 tablet (1,000 mcg total) by mouth daily. 30 tablet 0   No current facility-administered medications on file prior to visit.     Current Problems (verified) Patient Active Problem List   Diagnosis Date Noted  . Normocytic anemia 05/02/2017  . Vitamin B12 deficiency 05/02/2017  . OA (osteoarthritis) of hip 06/24/2016  . CKD (chronic kidney disease) stage 3, GFR 30-59 ml/min (HCC) 03/25/2015  . Snoring 03/25/2015  . Encounter for Medicare annual wellness exam 11/27/2014  .  Hyperparathyroidism, primary ([place]) 07/09/2014  . Vitamin D deficiency 06/13/2013  . Prediabetes 06/13/2013  . Medication management 06/13/2013  . Hyperlipidemia   . Hypertension   . GERD (gastroesophageal reflux disease)   . Kidney stones   . Morbidly obese ([place])   . Stricture and stenosis of esophagus 10/18/2008    Screening Tests Immunization History  Administered Date(s) Administered  . Influenza Split 12/27/2012  . Influenza, High Dose Seasonal PF 01/31/2014, 11/27/2014  . Influenza-Unspecified 01/12/2017  . Pneumococcal Conjugate-13 03/25/2015  . Pneumococcal Polysaccharide-23 03/02/2008  . Td 03/02/2005, 09/24/2015  . Zoster 03/02/2006   Preventative care: Last colonoscopy: 01/2017, 3 year follow up recommended EGD 2012 MGM 2013  Prior vaccinations: TD or Tdap: 2017 Influenza: 2018  Pneumococcal: 2010 Prevnar13: 2017 Shingles/Zostavax: 2008  Names of Other Physician/Practitioners you currently use: 1. [facility] here for primary care 2. Dr. Katy Fitch,  eye doctor, last visit 11/2014 *** 3. Dr. Zachery Dakins, dentist, last visit 01/2016  Patient Care Team: Unk Pinto, MD as PCP - General (Internal Medicine) Newt Minion, MD as Consulting Physician (Orthopedic Surgery) Inda Castle, MD (Inactive) as Consulting Physician (Gastroenterology) Franchot Gallo, MD as Consulting Physician (Urology)  Allergies Allergies  Allergen Reactions  . Iodinated Diagnostic Agents Shortness Of Breath  . Iohexol Shortness Of Breath  . Ace Inhibitors Cough    SURGICAL HISTORY He  has a past surgical history that includes tumor ear (Left, age 24); Cystoscopy w/ ureteroscopy w/ lithotripsy (Left, 05/04/2005); Colonoscopy (02/17/2005); Nephrolithotomy (Left, 02/01/2014); Parathyroidectomy (Left, 07/10/2014); CYSTO/  LEFT RETROGRADE PYELOGRAM/ STENT PLACEMENT (01/26/2005); Esophagogastroduodenoscopy (egd) with esophageal dilation (x4  last one  07-21-2010); Inguinal hernia repair (Left, yrs ago); Cystoscopy/retrograde/ureteroscopy/stone extraction with basket (Left, 12/30/2015); Cystoscopy with stent placement (Left, 12/30/2015); Holmium laser application (Left, 83/41/9622); Cystoscopy/ureteroscopy/holmium laser/stent placement (Left, 03/09/2016); Total hip arthroplasty (Left, 06/24/2016); Total hip revision (Left, 11/18/2016); and ORIF hip fracture (Left, 04/16/2017). FAMILY HISTORY His family history includes Alzheimer's disease in his mother; Bladder Cancer in his sister; Heart disease in his father and mother. SOCIAL HISTORY He  reports that he quit smoking about 43 years ago. He quit after 8.00 years of use. He has never used smokeless tobacco. He reports that he drinks about 1.0 - 2.0 standard drinks of alcohol per week. He reports that he does not use drugs.  MEDICARE WELLNESS OBJECTIVES: Physical activity:   Cardiac risk factors:   Depression/mood screen:   Depression screen [facility] 2/9 09/28/2016  Decreased Interest 0  Down, Depressed, Hopeless 0  PHQ - 2 Score 0    ADLs:  In your present state of health, do you have any difficulty performing the following activities: 05/04/2017 04/12/2017  Hearing? N N  Vision? Y Y  Difficulty concentrating or making decisions? N N  Walking or climbing stairs? N N  Dressing or bathing? N N  Doing errands, shopping? N N  Some recent data might be hidden     Cognitive Testing  Alert? Yes  Normal Appearance?Yes  Oriented to person? Yes  Place? Yes   Time? Yes  Recall of three objects?  Yes  Can perform simple calculations? Yes  Displays appropriate judgment?Yes  Can read the correct time from a watch face?Yes  EOL planning:     Objective:   There were no vitals filed for this visit. There is no height or weight on file to calculate BMI.  General appearance: alert, no distress, WD/WN, male HEENT: normocephalic, sclerae anicteric, TMs pearly, nares patent, no discharge or erythema,  pharynx normal Oral cavity: MMM, no lesions, crowded mouth Neck: supple, large neck circumference, no lymphadenopathy, no thyromegaly, no masses Heart: RRR, normal S1, S2, no murmurs Lungs: CTA bilaterally, no wheezes, rhonchi, or rales Abdomen: +bs, soft, obese, non tender, non distended, no masses, no hepatomegaly, no splenomegaly Musculoskeletal: nontender, no swelling, no obvious deformity Extremities: no edema, no cyanosis, no clubbing Pulses: 2+ symmetric, upper and lower extremities, normal cap refill Neurological: alert, oriented x 3, CN2-12 intact, strength normal upper extremities and lower extremities, sensation normal throughout, DTRs 2+ throughout, no cerebellar signs, gait antalgic Psychiatric: normal affect, behavior normal, pleasant   Medicare Attestation I have personally reviewed: The patient's medical and social history Their use of alcohol, tobacco or illicit drugs Their current medications and supplements The patient's functional ability including ADLs,fall risks, home safety risks, cognitive, and hearing and visual impairment Diet and physical activities Evidence for  depression or mood disorders  The patient's weight, height, BMI, and visual acuity have been recorded in the chart.  I have made referrals, counseling, and provided education to the patient based on review of the above and I have provided the patient with a written personalized care plan for preventive services.     Izora Ribas, NP   10/11/2017

## 2017-10-12 ENCOUNTER — Ambulatory Visit: Payer: Self-pay | Admitting: Adult Health

## 2017-10-19 ENCOUNTER — Other Ambulatory Visit: Payer: Self-pay | Admitting: Orthopedic Surgery

## 2017-10-19 ENCOUNTER — Ambulatory Visit
Admission: RE | Admit: 2017-10-19 | Discharge: 2017-10-19 | Disposition: A | Discharge status: Home / Self Care | Payer: Medicare Other | Source: Ambulatory Visit | Attending: Orthopedic Surgery | Admitting: Orthopedic Surgery

## 2017-10-19 DIAGNOSIS — M5441 Lumbago with sciatica, right side: Secondary | ICD-10-CM | POA: Diagnosis not present

## 2017-10-19 DIAGNOSIS — G8929 Other chronic pain: Secondary | ICD-10-CM

## 2017-10-19 DIAGNOSIS — M545 Low back pain, unspecified: Secondary | ICD-10-CM

## 2017-10-19 DIAGNOSIS — M48061 Spinal stenosis, lumbar region without neurogenic claudication: Secondary | ICD-10-CM | POA: Diagnosis not present

## 2017-10-20 ENCOUNTER — Other Ambulatory Visit: Payer: Self-pay | Admitting: Orthopedic Surgery

## 2017-10-20 DIAGNOSIS — M545 Low back pain: Secondary | ICD-10-CM

## 2017-10-22 ENCOUNTER — Ambulatory Visit: Payer: Self-pay | Admitting: Adult Health

## 2017-10-25 ENCOUNTER — Encounter: Payer: Self-pay | Admitting: Internal Medicine

## 2017-10-25 DIAGNOSIS — M5416 Radiculopathy, lumbar region: Secondary | ICD-10-CM | POA: Diagnosis not present

## 2017-10-28 ENCOUNTER — Encounter: Payer: Medicare Other | Admitting: Diagnostic Neuroimaging

## 2017-10-28 ENCOUNTER — Ambulatory Visit (INDEPENDENT_AMBULATORY_CARE_PROVIDER_SITE_OTHER): Payer: Medicare Other | Admitting: Diagnostic Neuroimaging

## 2017-10-28 DIAGNOSIS — R29898 Other symptoms and signs involving the musculoskeletal system: Secondary | ICD-10-CM | POA: Diagnosis not present

## 2017-10-28 DIAGNOSIS — Z0289 Encounter for other administrative examinations: Secondary | ICD-10-CM

## 2017-10-29 DIAGNOSIS — M5416 Radiculopathy, lumbar region: Secondary | ICD-10-CM | POA: Diagnosis not present

## 2017-11-02 ENCOUNTER — Other Ambulatory Visit: Payer: Self-pay | Admitting: Internal Medicine

## 2017-11-02 ENCOUNTER — Telehealth: Payer: Self-pay | Admitting: *Deleted

## 2017-11-02 ENCOUNTER — Telehealth: Payer: Self-pay | Admitting: Diagnostic Neuroimaging

## 2017-11-02 DIAGNOSIS — R1312 Dysphagia, oropharyngeal phase: Secondary | ICD-10-CM

## 2017-11-02 DIAGNOSIS — M5126 Other intervertebral disc displacement, lumbar region: Secondary | ICD-10-CM | POA: Diagnosis not present

## 2017-11-02 DIAGNOSIS — M5416 Radiculopathy, lumbar region: Secondary | ICD-10-CM | POA: Diagnosis not present

## 2017-11-02 DIAGNOSIS — I10 Essential (primary) hypertension: Secondary | ICD-10-CM | POA: Diagnosis not present

## 2017-11-02 NOTE — Procedures (Signed)
[facility]  NCS (NERVE CONDUCTION STUDY) WITH EMG (ELECTROMYOGRAPHY) REPORT   STUDY DATE: 10/28/17 PATIENT NAME: [name] [name] DOB: [birth year] MRN: [id number]  ORDERING CLINICIAN: Collene Gobble  TECHNOLOGIST: Oneita Jolly ELECTROMYOGRAPHER: Earlean Polka. Makenzi Bannister, MD  CLINICAL INFORMATION: 77 year old male with bilateral lower extremity weakness; right > left foot drop.  Evaluate for right lumbar radiculopathy.   FINDINGS: NERVE CONDUCTION STUDY: Right peroneal motor response has normal distal latency, normal amplitude, borderline slow conduction velocity.  Left peroneal and bilateral tibial motor responses have slightly prolonged distal latencies, normal amplitudes, slow conduction velocities.  Bilateral sural and superficial peroneal sensory responses have prolonged peak latencies and decreased amplitudes.  Bilateral tibial F wave latencies are prolonged.   NEEDLE ELECTROMYOGRAPHY:  Needle examination of right lower extremity notable for abnormal spontaneous activity in the right tibialis anterior, right tibialis posterior, right gastrocnemius and right lumbar paraspinal muscles at rest.  Neurogenic recruitment pattern noted in right tibialis posterior, right peroneus longus, right gluteus medius.   IMPRESSION:   Abnormal study demonstrating: - Electrodiagnostic evidence for right L5, S1 radiculopathy. - Mild underlying axonal sensory motor polyneuropathy/     INTERPRETING PHYSICIAN:  Penni Bombard, MD Certified in Neurology, Neurophysiology and Neuroimaging  [facility] [address], [place], [place] [address] [phone]   [facility]    Nerve / Sites Muscle Latency Ref. Amplitude Ref. Rel Amp Segments Distance Velocity Ref. Area    ms ms mV mV %  cm m/s m/s mVms  R Peroneal - EDB     Ankle EDB 6.4 ?6.5 2.0 ?2.0 100 Ankle - EDB 9   6.8     Fib head EDB 14.2  1.7  82.1 Fib head - Ankle 33 42 ?44 7.3     Pop fossa  EDB 16.7  1.7  101 Pop fossa - Fib head 10 41 ?44 5.9         Pop fossa - Ankle      L Peroneal - EDB     Ankle EDB 6.9 ?6.5 2.9 ?2.0 100 Ankle - EDB 9   12.3     Fib head EDB 15.0  2.8  96.6 Fib head - Ankle 33 41 ?44 13.2     Pop fossa EDB 17.6  2.8  98.3 Pop fossa - Fib head 10 39 ?44 12.8         Pop fossa - Ankle      R Tibial - AH     Ankle AH 6.3 ?5.8 5.2 ?4.0 100 Ankle - AH 9   10.9     Pop fossa AH 18.6  3.4  65.2 Pop fossa - Ankle 41 33 ?41 8.2  L Tibial - AH     Ankle AH 5.9 ?5.8 6.0 ?4.0 100 Ankle - AH 9   14.4     Pop fossa AH 17.8  4.9  82.4 Pop fossa - Ankle 41 34 ?41 13.1             SNC    Nerve / Sites Rec. Site Peak Lat Ref.  Amp Ref. Segments Distance    ms ms V V  cm  R Sural - Ankle (Calf)     Calf Ankle 4.9 ?4.4 4 ?6 Calf - Ankle 14  L Sural - Ankle (Calf)     Calf Ankle 5.3 ?4.4 2 ?6 Calf - Ankle 14  R Superficial peroneal - Ankle     Lat leg Ankle 4.9 ?  4.4 3 ?6 Lat leg - Ankle 14  L Superficial peroneal - Ankle     Lat leg Ankle 4.4 ?4.4 3 ?6 Lat leg - Ankle 14              F  Wave    Nerve F Lat Ref.   ms ms  R Tibial - AH 68.4 ?56.0  L Tibial - AH 66.5 ?56.0         EMG full       EMG Summary Table    Spontaneous MUAP Recruitment  Muscle IA Fib PSW Fasc Other Amp Dur. Poly Pattern  R. Tibialis anterior Normal None 1+ None _______ Normal Normal Normal No Activity  R. Tibialis posterior Normal 1+ None None _______ Normal Normal Normal Discrete  R. Gastrocnemius (Medial head) Normal None 1+ None _______ Increased Normal Normal Reduced  R. Peroneus longus Normal None None None _______ Increased Normal Normal Discrete  R. Vastus medialis Normal None None None _______ Normal Normal Normal Normal  R. Gluteus medius Normal None None None _______ Normal Normal Normal Reduced  R. Lumbar paraspinals Normal 2+ 2+ None _______ Normal Normal Normal Normal

## 2017-11-02 NOTE — Telephone Encounter (Signed)
Patient wife called in stating they would like his NCV/EMG results to be sent to [place].

## 2017-11-02 NOTE — Progress Notes (Deleted)
MEDICARE ANNUAL WELLNESS VISIT AND FOLLOW UP Assessment:    Medicare annual wellness visit   Essential hypertension - continue medications, DASH diet, exercise and monitor at home. Call if greater than 130/80.  - CBC with Differential/Platelet - CMP WITH GFR - TSH   Morbid obesity, unspecified obesity type (HCC)/ snoring Obesity with co morbidities- long discussion about weight loss, diet, and exercise HAS NOT HAD SLEEP STUDY YET BUT VERY LIKELY HAS OSA GET SLEEP STUDY ***  Hyperparathyroidism, primary ([place]) S/p surgical resection, monitor - CMP WITH GFR  Prediabetes - Hemoglobin A1c  Hyperlipidemia -continue medications, check lipids, decrease fatty foods, increase activity.  - Lipid panel  Medication management - Magnesium  Vitamin D deficiency - VITAMIN D 25 Hydroxy (Vit-D Deficiency, Fractures)   Kidney stones Follow up urology PRN CMP/GFR   Stricture and stenosis of esophagus Continue PPI, followed by GI  Gastroesophageal reflux disease, esophagitis presence not specified Continue PPI  CKD (chronic kidney disease) stage 3, GFR 30-59 ml/min Increase fluids, avoid NSAIDS, monitor sugars, will monitor - CMP/ GFR  Normocytic anemia/vitamin B12 def Continue supplement, monitor CBC, check B12 at CPE  Hip arthritis S/p surgical repair, ***  BPH  Continue flomax, finasteride ***  Over 30 minutes of exam, counseling, chart review, and critical decision making was performed  Future Appointments  Date Time Provider [place]  11/03/2017 11:15 AM Liane Comber, NP [facility] None  12/28/2017  3:45 PM Unk Pinto, MD [facility] None     Plan:   During the course of the visit the patient was educated and counseled about appropriate screening and preventive services including:    Pneumococcal vaccine   Influenza vaccine  Prevnar 13  Td vaccine  Screening electrocardiogram  Colorectal cancer screening  Diabetes screening  Glaucoma  screening  Nutrition counseling    Subjective:  [name] [name] is a 77 y.o. male who presents for Medicare Annual Wellness Visit and 3 month follow up for HTN, hyperlipidemia, prediabetes, and vitamin D Def. In May 2016, he underwent a L Superior Parathyroidectomy. He has had lithotripsy for recurrent kidney stones. Patient underwent repeat Lt Hip arthroplasty in April 2018 by Dr Wynelle Link for failed hip arthroplasty. He has hx of chronic GERD, on PPI with hx of multiple dilations x 3, last in 2012.   BMI is There is no height or weight on file to calculate BMI., he {HAS HAS [id number]} been working on diet and exercise. Wt Readings from Last 3 Encounters:  05/20/17 257 lb (116.6 kg)  05/03/17 272 lb 0.8 oz (123.4 kg)  04/16/17 263 lb (119.3 kg)   His blood pressure has been controlled at home, today their BP is   He does not workout. He denies chest pain, shortness of breath, dizziness.   He is on cholesterol medication (fenofibrate) and denies myalgias. His cholesterol is at goal. The cholesterol last visit was:   Lab Results  Component Value Date   CHOL 162 05/20/2017   HDL 46 05/20/2017   LDLCALC 95 05/20/2017   TRIG 115 05/20/2017   CHOLHDL 3.5 05/20/2017   He has been working on diet and exercise for prediabetes, and denies paresthesia of the feet, polydipsia, polyuria and visual disturbances. Last A1C in the office was:  Lab Results  Component Value Date   HGBA1C 6.0 (H) 05/20/2017   Last GFR  Lab Results  Component Value Date   GFRNONAA 39 (L) 05/20/2017   Patient is on Vitamin D supplement.   Lab Results  Component  Value Date   VD25OH 60 05/20/2017       Medication Review: Current Outpatient Medications on File Prior to Visit  Medication Sig Dispense Refill  . aspirin EC 325 MG tablet Take 1 tablet (325 mg total) by mouth 2 (two) times daily. 30 tablet 0  . b complex vitamins tablet Take 1 tablet by mouth daily.    . bisoprolol-hydrochlorothiazide (ZIAC) 5-6.25 MG  tablet TAKE 1 TABLET BY MOUTH EVERY MORNING FOR BLOOD PRESSURE 90 tablet 4  . Cholecalciferol (VITAMIN D PO) Take 1 tablet by mouth 2 (two) times daily.     . fenofibrate (TRICOR) 145 MG tablet TAKE 1 TABLET BY MOUTH DAILY FOR TRIGLYCERIDES/BLOOD FATS 90 tablet 3  . finasteride (PROSCAR) 5 MG tablet Take 5 mg by mouth daily.    . Flaxseed, Linseed, (FLAX SEEDS PO) Take 1,000 mg by mouth daily.     Marland Kitchen ibuprofen (ADVIL,MOTRIN) 200 MG tablet Take 600 mg by mouth every 6 (six) hours as needed for fever or moderate pain.    Marland Kitchen losartan (COZAAR) 100 MG tablet TAKE 1 TABLET BY MOUTH DAILY FOR BLOOD PRESSURE. (Patient taking differently: TAKE 1 TABLET (100mg ) BY MOUTH DAILY FOR BLOOD PRESSURE.) 90 tablet 3  . Magnesium Oxide 250 MG TABS Take 500 mg by mouth daily.     . meloxicam (MOBIC) 15 MG tablet Take 1/2 to 1 tablet daily with food for pain & Inflammation 30 tablet 0  . Omega-3 Fatty Acids (FISH OIL) 1200 MG CAPS Take 1,200 mg by mouth daily.     Marland Kitchen omeprazole (PRILOSEC) 40 MG capsule Take 40 mg by mouth daily.    Marland Kitchen omeprazole (PRILOSEC) 40 MG capsule TAKE 1 CAPSULE BY MOUTH DAILY. 90 capsule 1  . tamsulosin (FLOMAX) 0.4 MG CAPS capsule Take 0.4 mg by mouth daily after breakfast.   3  . tiZANidine (ZANAFLEX) 2 MG tablet Take 1 tablet (2 mg total) by mouth every 6 (six) hours as needed for muscle spasms. (Patient not taking: Reported on 05/20/2017) 60 tablet 0  . vitamin B-12 (CYANOCOBALAMIN) 1000 MCG tablet Take 1 tablet (1,000 mcg total) by mouth daily. 30 tablet 0   No current facility-administered medications on file prior to visit.     Current Problems (verified) Patient Active Problem List   Diagnosis Date Noted  . Normocytic anemia 05/02/2017  . Vitamin B12 deficiency 05/02/2017  . OA (osteoarthritis) of hip 06/24/2016  . CKD (chronic kidney disease) stage 3, GFR 30-59 ml/min (HCC) 03/25/2015  . Snoring 03/25/2015  . Encounter for Medicare annual wellness exam 11/27/2014  .  Hyperparathyroidism, primary ([place]) 07/09/2014  . Vitamin D deficiency 06/13/2013  . Prediabetes 06/13/2013  . Medication management 06/13/2013  . Hyperlipidemia   . Hypertension   . GERD (gastroesophageal reflux disease)   . Kidney stones   . Morbidly obese ([place])   . Stricture and stenosis of esophagus 10/18/2008    Screening Tests Immunization History  Administered Date(s) Administered  . Influenza Split 12/27/2012  . Influenza, High Dose Seasonal PF 01/31/2014, 11/27/2014  . Influenza-Unspecified 01/12/2017  . Pneumococcal Conjugate-13 03/25/2015  . Pneumococcal Polysaccharide-23 03/02/2008  . Td 03/02/2005, 09/24/2015  . Zoster 03/02/2006   Preventative care: Last colonoscopy: 01/2017, 3 year follow up recommended EGD 2012 MGM 2013  Prior vaccinations: TD or Tdap: 2017 Influenza: 2018  Pneumococcal: 2010 Prevnar13: 2017 Shingles/Zostavax: 2008  Names of Other Physician/Practitioners you currently use: 1. [facility] here for primary care 2. Dr. Katy Fitch, eye  doctor, last visit 11/2014 *** 3. Dr. Zachery Dakins, dentist, last visit 01/2016  Patient Care Team: Unk Pinto, MD as PCP - General (Internal Medicine) Newt Minion, MD as Consulting Physician (Orthopedic Surgery) Inda Castle, MD (Inactive) as Consulting Physician (Gastroenterology) Franchot Gallo, MD as Consulting Physician (Urology)  Allergies Allergies  Allergen Reactions  . Iodinated Diagnostic Agents Shortness Of Breath  . Iohexol Shortness Of Breath  . Ace Inhibitors Cough    SURGICAL HISTORY He  has a past surgical history that includes tumor ear (Left, age 70); Cystoscopy w/ ureteroscopy w/ lithotripsy (Left, 05/04/2005); Colonoscopy (02/17/2005); Nephrolithotomy (Left, 02/01/2014); Parathyroidectomy (Left, 07/10/2014); CYSTO/  LEFT RETROGRADE PYELOGRAM/ STENT PLACEMENT (01/26/2005); Esophagogastroduodenoscopy (egd) with esophageal dilation (x4  last one  07-21-2010); Inguinal hernia repair (Left, yrs ago); Cystoscopy/retrograde/ureteroscopy/stone extraction with basket (Left, 12/30/2015); Cystoscopy with stent placement (Left, 12/30/2015); Holmium laser application (Left, 29/56/2130); Cystoscopy/ureteroscopy/holmium laser/stent placement (Left, 03/09/2016); Total hip arthroplasty (Left, 06/24/2016); Total hip revision (Left, 11/18/2016); and ORIF hip fracture (Left, 04/16/2017). FAMILY HISTORY His family history includes Alzheimer's disease in his mother; Bladder Cancer in his sister; Heart disease in his father and mother. SOCIAL HISTORY He  reports that he quit smoking about 43 years ago. He quit after 8.00 years of use. He has never used smokeless tobacco. He reports that he drinks about 1.0 - 2.0 standard drinks of alcohol per week. He reports that he does not use drugs.  MEDICARE WELLNESS OBJECTIVES: Physical activity:   Cardiac risk factors:   Depression/mood screen:   Depression screen [facility] 2/9 09/28/2016  Decreased Interest 0  Down, Depressed, Hopeless 0  PHQ - 2 Score 0    ADLs:  In your present state of health, do you have any difficulty performing the following activities: 05/04/2017 04/12/2017  Hearing? N N  Vision? Y Y  Difficulty concentrating or making decisions? N N  Walking or climbing stairs? N N  Dressing or bathing? N N  Doing errands, shopping? N N  Some recent data might be hidden     Cognitive Testing  Alert? Yes  Normal Appearance?Yes  Oriented to person? Yes  Place? Yes   Time? Yes  Recall of three objects?  Yes  Can perform simple calculations? Yes  Displays appropriate judgment?Yes  Can read the correct time from a watch face?Yes  EOL planning:     Objective:   There were no vitals filed for this visit. There is no height or weight on file to calculate BMI.  General appearance: alert, no distress, WD/WN, male HEENT: normocephalic, sclerae anicteric, TMs pearly, nares patent, no discharge or erythema,  pharynx normal Oral cavity: MMM, no lesions, crowded mouth Neck: supple, large neck circumference, no lymphadenopathy, no thyromegaly, no masses Heart: RRR, normal S1, S2, no murmurs Lungs: CTA bilaterally, no wheezes, rhonchi, or rales Abdomen: +bs, soft, obese, non tender, non distended, no masses, no hepatomegaly, no splenomegaly Musculoskeletal: nontender, no swelling, no obvious deformity Extremities: no edema, no cyanosis, no clubbing Pulses: 2+ symmetric, upper and lower extremities, normal cap refill Neurological: alert, oriented x 3, CN2-12 intact, strength normal upper extremities and lower extremities, sensation normal throughout, DTRs 2+ throughout, no cerebellar signs, gait antalgic Psychiatric: normal affect, behavior normal, pleasant   Medicare Attestation I have personally reviewed: The patient's medical and social history Their use of alcohol, tobacco or illicit drugs Their current medications and supplements The patient's functional ability including ADLs,fall risks, home safety risks, cognitive, and hearing and visual impairment Diet and physical activities Evidence for depression  or mood disorders  The patient's weight, height, BMI, and visual acuity have been recorded in the chart.  I have made referrals, counseling, and provided education to the patient based on review of the above and I have provided the patient with a written personalized care plan for preventive services.     Izora Ribas, NP   11/02/2017

## 2017-11-02 NOTE — Telephone Encounter (Signed)
Susie at Dr. Clarice Pole office requesting NCV/EMG results faxed to [phone] ATTN: Susie.

## 2017-11-02 NOTE — Telephone Encounter (Signed)
This was faxed  with confirmation to susie [phone].  I called to confirm, but susie in room with pt.  (so refaxed to be sure got it).

## 2017-11-02 NOTE — Telephone Encounter (Signed)
Patient's spouse called and reported the patient is having difficulty swallowing.  He feels like food is sticking at the back of his throat.  Per Dr Cherylann Ratel referral will be made to Dr Radene Journey, ENT and he was advised to increase his Omeprazole 40 mg to 2 capsules daily.

## 2017-11-03 ENCOUNTER — Other Ambulatory Visit: Payer: Self-pay | Admitting: Neurological Surgery

## 2017-11-03 ENCOUNTER — Ambulatory Visit: Payer: Self-pay | Admitting: Adult Health

## 2017-11-08 ENCOUNTER — Other Ambulatory Visit: Payer: Self-pay

## 2017-11-08 ENCOUNTER — Telehealth: Payer: Self-pay | Admitting: Gastroenterology

## 2017-11-08 ENCOUNTER — Encounter (HOSPITAL_COMMUNITY): Payer: Self-pay | Admitting: *Deleted

## 2017-11-08 DIAGNOSIS — R1319 Other dysphagia: Secondary | ICD-10-CM

## 2017-11-08 MED ORDER — PANTOPRAZOLE SODIUM 40 MG PO TBEC: 40.0000 mg | DELAYED_RELEASE_TABLET | Freq: Every day | ORAL | 0 refills | Status: DC

## 2017-11-08 NOTE — Telephone Encounter (Signed)
He was given Pantoprazole by a friend. He feels it works better than the Prilosec. He wants to be changed to Pantoprazole. Please advise. Last office visit December 2018.

## 2017-11-08 NOTE — Telephone Encounter (Signed)
Changed to Protonix. Advised the wife.

## 2017-11-08 NOTE — Telephone Encounter (Signed)
Ok to switch to Pantoprazole 40mg  daily. Thanks

## 2017-11-08 NOTE — Progress Notes (Signed)
[name] [name] has pre- diabetes, A1C runs 5.6- 6.2.  Patient does not check CBg often.  I instructed patient to check CBG after awaking and every 2 hours until arrival  to the hospital.  I Instructed patient if CBG is less than 70 to drink1/2 cup of a clear juice. Recheck CBG in 15 minutes then call pre- op desk at [phone] for further instructions.

## 2017-11-09 ENCOUNTER — Encounter (HOSPITAL_COMMUNITY)
Admission: RE | Disposition: A | Discharge status: Home Health | Payer: Self-pay | Source: Ambulatory Visit | Attending: Neurological Surgery

## 2017-11-09 ENCOUNTER — Encounter (HOSPITAL_COMMUNITY): Payer: Self-pay | Admitting: Anesthesiology

## 2017-11-09 ENCOUNTER — Observation Stay (HOSPITAL_COMMUNITY)
Admission: RE | Admit: 2017-11-09 | Discharge: 2017-11-10 | Disposition: A | Discharge status: Home Health | Payer: Medicare Other | Source: Ambulatory Visit | Attending: Neurological Surgery | Admitting: Neurological Surgery

## 2017-11-09 ENCOUNTER — Ambulatory Visit (HOSPITAL_COMMUNITY): Payer: Medicare Other | Admitting: Anesthesiology

## 2017-11-09 ENCOUNTER — Ambulatory Visit (HOSPITAL_COMMUNITY): Payer: Medicare Other

## 2017-11-09 DIAGNOSIS — Z7982 Long term (current) use of aspirin: Secondary | ICD-10-CM | POA: Insufficient documentation

## 2017-11-09 DIAGNOSIS — E785 Hyperlipidemia, unspecified: Secondary | ICD-10-CM | POA: Insufficient documentation

## 2017-11-09 DIAGNOSIS — M5126 Other intervertebral disc displacement, lumbar region: Secondary | ICD-10-CM | POA: Diagnosis not present

## 2017-11-09 DIAGNOSIS — Z791 Long term (current) use of non-steroidal anti-inflammatories (NSAID): Secondary | ICD-10-CM | POA: Insufficient documentation

## 2017-11-09 DIAGNOSIS — K219 Gastro-esophageal reflux disease without esophagitis: Secondary | ICD-10-CM | POA: Insufficient documentation

## 2017-11-09 DIAGNOSIS — M5116 Intervertebral disc disorders with radiculopathy, lumbar region: Principal | ICD-10-CM | POA: Insufficient documentation

## 2017-11-09 DIAGNOSIS — N183 Chronic kidney disease, stage 3 (moderate): Secondary | ICD-10-CM | POA: Diagnosis not present

## 2017-11-09 DIAGNOSIS — Z87891 Personal history of nicotine dependence: Secondary | ICD-10-CM | POA: Insufficient documentation

## 2017-11-09 DIAGNOSIS — Z419 Encounter for procedure for purposes other than remedying health state, unspecified: Secondary | ICD-10-CM

## 2017-11-09 DIAGNOSIS — M21371 Foot drop, right foot: Secondary | ICD-10-CM | POA: Insufficient documentation

## 2017-11-09 DIAGNOSIS — Z79899 Other long term (current) drug therapy: Secondary | ICD-10-CM | POA: Diagnosis not present

## 2017-11-09 DIAGNOSIS — Z981 Arthrodesis status: Secondary | ICD-10-CM | POA: Diagnosis not present

## 2017-11-09 DIAGNOSIS — Z96642 Presence of left artificial hip joint: Secondary | ICD-10-CM | POA: Insufficient documentation

## 2017-11-09 DIAGNOSIS — I10 Essential (primary) hypertension: Secondary | ICD-10-CM | POA: Diagnosis not present

## 2017-11-09 DIAGNOSIS — I129 Hypertensive chronic kidney disease with stage 1 through stage 4 chronic kidney disease, or unspecified chronic kidney disease: Secondary | ICD-10-CM | POA: Diagnosis not present

## 2017-11-09 HISTORY — PX: LUMBAR LAMINECTOMY/DECOMPRESSION MICRODISCECTOMY: SHX5026

## 2017-11-09 LAB — BASIC METABOLIC PANEL
Anion gap: 15 (ref 5–15)
BUN: 23 mg/dL (ref 8–23)
CO2: 21 mmol/L — ABNORMAL LOW (ref 22–32)
Calcium: 10 mg/dL (ref 8.9–10.3)
Chloride: 101 mmol/L (ref 98–111)
Creatinine, Ser: 1.66 mg/dL — ABNORMAL HIGH (ref 0.61–1.24)
GFR calc Af Amer: 45 mL/min — ABNORMAL LOW (ref >60)
GFR calc non Af Amer: 39 mL/min — ABNORMAL LOW (ref >60)
Glucose, Bld: 101 mg/dL — ABNORMAL HIGH (ref 70–99)
Potassium: 4.4 mmol/L (ref 3.5–5.1)
Sodium: 137 mmol/L (ref 135–145)

## 2017-11-09 LAB — CBC
HCT: 44.8 % (ref 39.0–52.0)
Hemoglobin: 13.6 g/dL (ref 13.0–17.0)
MCH: 25.3 pg — ABNORMAL LOW (ref 26.0–34.0)
MCHC: 30.4 g/dL (ref 30.0–36.0)
MCV: 83.3 fL (ref 78.0–100.0)
Platelets: 332 10*3/uL (ref 150–400)
RBC: 5.38 MIL/uL (ref 4.22–5.81)
RDW: 17.8 % — ABNORMAL HIGH (ref 11.5–15.5)
WBC: 9.3 10*3/uL (ref 4.0–10.5)

## 2017-11-09 LAB — GLUCOSE, CAPILLARY
Glucose-Capillary: 102 mg/dL — ABNORMAL HIGH (ref 70–99)
Glucose-Capillary: 98 mg/dL (ref 70–99)
Glucose-Capillary: 101 mg/dL — ABNORMAL HIGH (ref 70–99)

## 2017-11-09 SURGERY — LUMBAR LAMINECTOMY/DECOMPRESSION MICRODISCECTOMY 1 LEVEL
Anesthesia: General | Site: Back | Laterality: Left

## 2017-11-09 MED ORDER — ONDANSETRON HCL 4 MG/2ML IJ SOLN
INTRAMUSCULAR | Status: DC | PRN
  Administered 2017-11-09: 4 mg via INTRAVENOUS

## 2017-11-09 MED ORDER — LIDOCAINE 2% (20 MG/ML) 5 ML SYRINGE
INTRAMUSCULAR | Status: DC | PRN
  Administered 2017-11-09: 100 mg via INTRAVENOUS

## 2017-11-09 MED ORDER — FINASTERIDE 5 MG PO TABS
5.0000 mg | ORAL_TABLET | Freq: Every day | ORAL | Status: DC
  Administered 2017-11-10: 5 mg via ORAL
  Filled 2017-11-09: qty 1

## 2017-11-09 MED ORDER — PANTOPRAZOLE SODIUM 40 MG PO TBEC
40.0000 mg | DELAYED_RELEASE_TABLET | Freq: Every day | ORAL | Status: DC
  Administered 2017-11-10: 40 mg via ORAL
  Filled 2017-11-09: qty 1

## 2017-11-09 MED ORDER — FENTANYL CITRATE (PF) 250 MCG/5ML IJ SOLN
INTRAMUSCULAR | Status: AC
  Filled 2017-11-09: qty 5

## 2017-11-09 MED ORDER — METHOCARBAMOL 1000 MG/10ML IJ SOLN
500.0000 mg | Freq: Four times a day (QID) | INTRAVENOUS | Status: DC | PRN
  Filled 2017-11-09: qty 5

## 2017-11-09 MED ORDER — FLEET ENEMA 7-19 GM/118ML RE ENEM: 1.0000 | ENEMA | Freq: Once | RECTAL | Status: DC | PRN

## 2017-11-09 MED ORDER — SODIUM CHLORIDE 0.9% FLUSH
3.0000 mL | Freq: Two times a day (BID) | INTRAVENOUS | Status: DC
  Administered 2017-11-09: 3 mL via INTRAVENOUS

## 2017-11-09 MED ORDER — MORPHINE SULFATE (PF) 2 MG/ML IV SOLN: 2.0000 mg | INTRAVENOUS | Status: DC | PRN

## 2017-11-09 MED ORDER — SODIUM CHLORIDE 0.9 % IV SOLN: 250.0000 mL | INTRAVENOUS | Status: DC

## 2017-11-09 MED ORDER — PHENOL 1.4 % MT LIQD: 1.0000 | OROMUCOSAL | Status: DC | PRN

## 2017-11-09 MED ORDER — CHLORHEXIDINE GLUCONATE CLOTH 2 % EX PADS: 6.0000 | MEDICATED_PAD | Freq: Once | CUTANEOUS | Status: DC

## 2017-11-09 MED ORDER — LOSARTAN POTASSIUM 50 MG PO TABS
100.0000 mg | ORAL_TABLET | Freq: Every day | ORAL | Status: DC
  Filled 2017-11-09: qty 2

## 2017-11-09 MED ORDER — ACETAMINOPHEN 325 MG PO TABS: 650.0000 mg | ORAL_TABLET | ORAL | Status: DC | PRN

## 2017-11-09 MED ORDER — OXYCODONE-ACETAMINOPHEN 10-325 MG PO TABS: 1.0000 | ORAL_TABLET | Freq: Two times a day (BID) | ORAL | Status: DC

## 2017-11-09 MED ORDER — FENOFIBRATE 160 MG PO TABS
160.0000 mg | ORAL_TABLET | Freq: Every day | ORAL | Status: DC
  Administered 2017-11-10: 160 mg via ORAL
  Filled 2017-11-09: qty 1

## 2017-11-09 MED ORDER — ALUM & MAG HYDROXIDE-SIMETH 200-200-20 MG/5ML PO SUSP: 30.0000 mL | Freq: Four times a day (QID) | ORAL | Status: DC | PRN

## 2017-11-09 MED ORDER — LACTATED RINGERS IV SOLN
INTRAVENOUS | Status: DC
  Administered 2017-11-09: 13:00:00 via INTRAVENOUS

## 2017-11-09 MED ORDER — BUPIVACAINE HCL (PF) 0.5 % IJ SOLN
INTRAMUSCULAR | Status: AC
  Filled 2017-11-09: qty 30

## 2017-11-09 MED ORDER — PROMETHAZINE HCL 25 MG/ML IJ SOLN: 6.2500 mg | INTRAMUSCULAR | Status: DC | PRN

## 2017-11-09 MED ORDER — TAMSULOSIN HCL 0.4 MG PO CAPS
0.4000 mg | ORAL_CAPSULE | Freq: Every day | ORAL | Status: DC
  Administered 2017-11-10: 0.4 mg via ORAL
  Filled 2017-11-09: qty 1

## 2017-11-09 MED ORDER — PHENYLEPHRINE 40 MCG/ML (10ML) SYRINGE FOR IV PUSH (FOR BLOOD PRESSURE SUPPORT)
PREFILLED_SYRINGE | INTRAVENOUS | Status: AC
  Filled 2017-11-09: qty 10

## 2017-11-09 MED ORDER — MENTHOL 3 MG MT LOZG: 1.0000 | LOZENGE | OROMUCOSAL | Status: DC | PRN

## 2017-11-09 MED ORDER — PHENYLEPHRINE HCL 10 MG/ML IJ SOLN
INTRAMUSCULAR | Status: DC | PRN
  Administered 2017-11-09: 200 ug via INTRAVENOUS
  Administered 2017-11-09: 120 ug via INTRAVENOUS
  Administered 2017-11-09: 200 ug via INTRAVENOUS

## 2017-11-09 MED ORDER — SUCCINYLCHOLINE CHLORIDE 200 MG/10ML IV SOSY
PREFILLED_SYRINGE | INTRAVENOUS | Status: AC
  Filled 2017-11-09: qty 10

## 2017-11-09 MED ORDER — HEMOSTATIC AGENTS (NO CHARGE) OPTIME
TOPICAL | Status: DC | PRN
  Administered 2017-11-09: 1 via TOPICAL

## 2017-11-09 MED ORDER — LIDOCAINE-EPINEPHRINE 1 %-1:100000 IJ SOLN
INTRAMUSCULAR | Status: DC | PRN
  Administered 2017-11-09: 5 mL

## 2017-11-09 MED ORDER — POLYETHYLENE GLYCOL 3350 17 G PO PACK: 17.0000 g | PACK | Freq: Every day | ORAL | Status: DC | PRN

## 2017-11-09 MED ORDER — ONDANSETRON HCL 4 MG/2ML IJ SOLN: 4.0000 mg | Freq: Four times a day (QID) | INTRAMUSCULAR | Status: DC | PRN

## 2017-11-09 MED ORDER — SODIUM CHLORIDE 0.9 % IV SOLN
INTRAVENOUS | Status: DC | PRN
  Administered 2017-11-09: 50 ug/min via INTRAVENOUS

## 2017-11-09 MED ORDER — HYDROCODONE-ACETAMINOPHEN 5-325 MG PO TABS: 1.0000 | ORAL_TABLET | ORAL | Status: DC | PRN

## 2017-11-09 MED ORDER — CEFAZOLIN SODIUM-DEXTROSE 2-4 GM/100ML-% IV SOLN
2.0000 g | INTRAVENOUS | Status: AC
  Administered 2017-11-09: 2 g via INTRAVENOUS

## 2017-11-09 MED ORDER — SUCCINYLCHOLINE CHLORIDE 20 MG/ML IJ SOLN
INTRAMUSCULAR | Status: DC | PRN
  Administered 2017-11-09: 120 mg via INTRAVENOUS

## 2017-11-09 MED ORDER — SODIUM CHLORIDE 0.9% FLUSH: 3.0000 mL | INTRAVENOUS | Status: DC | PRN

## 2017-11-09 MED ORDER — ROCURONIUM BROMIDE 50 MG/5ML IV SOSY
PREFILLED_SYRINGE | INTRAVENOUS | Status: DC | PRN
  Administered 2017-11-09: 40 mg via INTRAVENOUS

## 2017-11-09 MED ORDER — EPHEDRINE SULFATE 50 MG/ML IJ SOLN
INTRAMUSCULAR | Status: DC | PRN
  Administered 2017-11-09 (×2): 10 mg via INTRAVENOUS

## 2017-11-09 MED ORDER — METHOCARBAMOL 500 MG PO TABS
500.0000 mg | ORAL_TABLET | Freq: Four times a day (QID) | ORAL | Status: DC | PRN
  Administered 2017-11-10: 500 mg via ORAL
  Filled 2017-11-09: qty 1

## 2017-11-09 MED ORDER — BISACODYL 10 MG RE SUPP: 10.0000 mg | Freq: Every day | RECTAL | Status: DC | PRN

## 2017-11-09 MED ORDER — EPHEDRINE 5 MG/ML INJ
INTRAVENOUS | Status: AC
  Filled 2017-11-09: qty 20

## 2017-11-09 MED ORDER — SENNA 8.6 MG PO TABS
1.0000 | ORAL_TABLET | Freq: Two times a day (BID) | ORAL | Status: DC
  Administered 2017-11-09 – 2017-11-10 (×2): 8.6 mg via ORAL
  Filled 2017-11-09 (×2): qty 1

## 2017-11-09 MED ORDER — FENTANYL CITRATE (PF) 100 MCG/2ML IJ SOLN
25.0000 ug | INTRAMUSCULAR | Status: DC | PRN
  Administered 2017-11-09: 25 ug via INTRAVENOUS

## 2017-11-09 MED ORDER — FENTANYL CITRATE (PF) 100 MCG/2ML IJ SOLN
INTRAMUSCULAR | Status: DC | PRN
  Administered 2017-11-09: 50 ug via INTRAVENOUS
  Administered 2017-11-09: 100 ug via INTRAVENOUS
  Administered 2017-11-09 (×2): 50 ug via INTRAVENOUS

## 2017-11-09 MED ORDER — PROPOFOL 10 MG/ML IV BOLUS
INTRAVENOUS | Status: AC
  Filled 2017-11-09: qty 20

## 2017-11-09 MED ORDER — CEFAZOLIN SODIUM-DEXTROSE 2-4 GM/100ML-% IV SOLN
INTRAVENOUS | Status: AC
  Filled 2017-11-09: qty 100

## 2017-11-09 MED ORDER — FENTANYL CITRATE (PF) 100 MCG/2ML IJ SOLN
INTRAMUSCULAR | Status: AC
  Filled 2017-11-09: qty 2

## 2017-11-09 MED ORDER — BUPIVACAINE HCL (PF) 0.5 % IJ SOLN
INTRAMUSCULAR | Status: DC | PRN
  Administered 2017-11-09: 10 mL
  Administered 2017-11-09: 5 mL

## 2017-11-09 MED ORDER — DEXAMETHASONE SODIUM PHOSPHATE 10 MG/ML IJ SOLN
INTRAMUSCULAR | Status: DC | PRN
  Administered 2017-11-09: 10 mg via INTRAVENOUS

## 2017-11-09 MED ORDER — LIDOCAINE 2% (20 MG/ML) 5 ML SYRINGE
INTRAMUSCULAR | Status: AC
  Filled 2017-11-09: qty 5

## 2017-11-09 MED ORDER — SODIUM CHLORIDE 0.9 % IV SOLN
INTRAVENOUS | Status: DC | PRN
  Administered 2017-11-09: 16:00:00

## 2017-11-09 MED ORDER — BISOPROLOL-HYDROCHLOROTHIAZIDE 5-6.25 MG PO TABS
1.0000 | ORAL_TABLET | Freq: Every day | ORAL | Status: DC
  Administered 2017-11-10: 1 via ORAL
  Filled 2017-11-09: qty 1

## 2017-11-09 MED ORDER — KETOROLAC TROMETHAMINE 15 MG/ML IJ SOLN
7.5000 mg | Freq: Four times a day (QID) | INTRAMUSCULAR | Status: DC
  Administered 2017-11-09 – 2017-11-10 (×3): 7.5 mg via INTRAVENOUS
  Filled 2017-11-09 (×2): qty 1

## 2017-11-09 MED ORDER — LACTATED RINGERS IV SOLN: INTRAVENOUS | Status: DC

## 2017-11-09 MED ORDER — 0.9 % SODIUM CHLORIDE (POUR BTL) OPTIME
TOPICAL | Status: DC | PRN
  Administered 2017-11-09: 1000 mL

## 2017-11-09 MED ORDER — PROPOFOL 10 MG/ML IV BOLUS
INTRAVENOUS | Status: DC | PRN
  Administered 2017-11-09: 170 mg via INTRAVENOUS

## 2017-11-09 MED ORDER — HYDROCODONE-ACETAMINOPHEN 5-325 MG PO TABS: 1.0000 | ORAL_TABLET | Freq: Four times a day (QID) | ORAL | Status: DC | PRN

## 2017-11-09 MED ORDER — THROMBIN 5000 UNITS EX SOLR
CUTANEOUS | Status: AC
  Filled 2017-11-09: qty 10000

## 2017-11-09 MED ORDER — THROMBIN 5000 UNITS EX SOLR
CUTANEOUS | Status: DC | PRN
  Administered 2017-11-09: 10000 [IU] via TOPICAL

## 2017-11-09 MED ORDER — SUGAMMADEX SODIUM 200 MG/2ML IV SOLN
INTRAVENOUS | Status: DC | PRN
  Administered 2017-11-09: 300 mg via INTRAVENOUS

## 2017-11-09 MED ORDER — THROMBIN 5000 UNITS EX SOLR
CUTANEOUS | Status: AC
  Filled 2017-11-09: qty 5000

## 2017-11-09 MED ORDER — DOCUSATE SODIUM 100 MG PO CAPS
100.0000 mg | ORAL_CAPSULE | Freq: Two times a day (BID) | ORAL | Status: DC
  Administered 2017-11-09 – 2017-11-10 (×2): 100 mg via ORAL
  Filled 2017-11-09 (×2): qty 1

## 2017-11-09 MED ORDER — KETOROLAC TROMETHAMINE 15 MG/ML IJ SOLN
INTRAMUSCULAR | Status: AC
  Filled 2017-11-09: qty 1

## 2017-11-09 MED ORDER — ONDANSETRON HCL 4 MG PO TABS: 4.0000 mg | ORAL_TABLET | Freq: Four times a day (QID) | ORAL | Status: DC | PRN

## 2017-11-09 MED ORDER — OXYCODONE HCL 5 MG PO TABS
5.0000 mg | ORAL_TABLET | Freq: Two times a day (BID) | ORAL | Status: DC
  Administered 2017-11-09 – 2017-11-10 (×2): 5 mg via ORAL
  Filled 2017-11-09 (×2): qty 1

## 2017-11-09 MED ORDER — LIDOCAINE-EPINEPHRINE 1 %-1:100000 IJ SOLN
INTRAMUSCULAR | Status: AC
  Filled 2017-11-09: qty 1

## 2017-11-09 MED ORDER — THROMBIN 5000 UNITS EX SOLR
OROMUCOSAL | Status: DC | PRN
  Administered 2017-11-09: 17:00:00 via TOPICAL

## 2017-11-09 MED ORDER — ACETAMINOPHEN 650 MG RE SUPP: 650.0000 mg | RECTAL | Status: DC | PRN

## 2017-11-09 MED ORDER — MEPERIDINE HCL 50 MG/ML IJ SOLN: 6.2500 mg | INTRAMUSCULAR | Status: DC | PRN

## 2017-11-09 MED ORDER — OXYCODONE-ACETAMINOPHEN 5-325 MG PO TABS
1.0000 | ORAL_TABLET | Freq: Two times a day (BID) | ORAL | Status: DC
  Administered 2017-11-09 – 2017-11-10 (×2): 1 via ORAL
  Filled 2017-11-09 (×2): qty 1

## 2017-11-09 MED ORDER — LACTATED RINGERS IV SOLN
INTRAVENOUS | Status: DC | PRN
  Administered 2017-11-09 (×2): via INTRAVENOUS

## 2017-11-09 SURGICAL SUPPLY — 46 items
ADH SKN CLS APL DERMABOND .7 (GAUZE/BANDAGES/DRESSINGS) ×1
BAG DECANTER FOR FLEXI CONT (MISCELLANEOUS) ×2 IMPLANT
BLADE CLIPPER SURG (BLADE) IMPLANT
BUR ACORN 6.0 (BURR) ×1 IMPLANT
BUR MATCHSTICK NEURO 3.0 LAGG (BURR) ×2 IMPLANT
CANISTER SUCT 3000ML PPV (MISCELLANEOUS) ×2 IMPLANT
DECANTER SPIKE VIAL GLASS SM (MISCELLANEOUS) ×2 IMPLANT
DERMABOND ADVANCED (GAUZE/BANDAGES/DRESSINGS) ×1
DERMABOND ADVANCED .7 DNX12 (GAUZE/BANDAGES/DRESSINGS) ×1 IMPLANT
DEVICE DISSECT PLASMABLAD 3.0S (MISCELLANEOUS) ×1 IMPLANT
DRAPE HALF SHEET 40X57 (DRAPES) IMPLANT
DRAPE LAPAROTOMY T 102X78X121 (DRAPES) ×2 IMPLANT
DRAPE MICROSCOPE LEICA (MISCELLANEOUS) ×1 IMPLANT
DURAPREP 26ML APPLICATOR (WOUND CARE) ×2 IMPLANT
ELECT REM PT RETURN 9FT ADLT (ELECTROSURGICAL) ×2
ELECTRODE REM PT RTRN 9FT ADLT (ELECTROSURGICAL) ×1 IMPLANT
GAUZE 4X4 16PLY RFD (DISPOSABLE) IMPLANT
GAUZE SPONGE 4X4 12PLY STRL (GAUZE/BANDAGES/DRESSINGS) ×2 IMPLANT
GLOVE BIOGEL PI IND STRL 8.5 (GLOVE) ×1 IMPLANT
GLOVE BIOGEL PI INDICATOR 8.5 (GLOVE) ×1
GLOVE ECLIPSE 8.5 STRL (GLOVE) ×2 IMPLANT
GOWN STRL REUS W/ TWL LRG LVL3 (GOWN DISPOSABLE) IMPLANT
GOWN STRL REUS W/ TWL XL LVL3 (GOWN DISPOSABLE) IMPLANT
GOWN STRL REUS W/TWL 2XL LVL3 (GOWN DISPOSABLE) ×2 IMPLANT
GOWN STRL REUS W/TWL LRG LVL3 (GOWN DISPOSABLE)
GOWN STRL REUS W/TWL XL LVL3 (GOWN DISPOSABLE)
HEMOSTAT POWDER KIT SURGIFOAM (HEMOSTASIS) ×1 IMPLANT
KIT BASIN OR (CUSTOM PROCEDURE TRAY) ×2 IMPLANT
KIT TURNOVER KIT B (KITS) ×2 IMPLANT
NDL SPNL 20GX3.5 QUINCKE YW (NEEDLE) IMPLANT
NEEDLE HYPO 22GX1.5 SAFETY (NEEDLE) ×2 IMPLANT
NEEDLE SPNL 20GX3.5 QUINCKE YW (NEEDLE) ×2 IMPLANT
NS IRRIG 1000ML POUR BTL (IV SOLUTION) ×2 IMPLANT
PACK LAMINECTOMY NEURO (CUSTOM PROCEDURE TRAY) ×2 IMPLANT
PAD ARMBOARD 7.5X6 YLW CONV (MISCELLANEOUS) ×6 IMPLANT
PATTIES SURGICAL .5 X1 (DISPOSABLE) ×1 IMPLANT
PLASMABLADE 3.0S (MISCELLANEOUS) ×2
RUBBERBAND STERILE (MISCELLANEOUS) ×2 IMPLANT
SPONGE SURGIFOAM ABS GEL SZ50 (HEMOSTASIS) ×2 IMPLANT
SUT VIC AB 1 CT1 18XBRD ANBCTR (SUTURE) ×1 IMPLANT
SUT VIC AB 1 CT1 8-18 (SUTURE) ×2
SUT VIC AB 2-0 CP2 18 (SUTURE) ×2 IMPLANT
SUT VIC AB 3-0 SH 8-18 (SUTURE) ×2 IMPLANT
TOWEL GREEN STERILE (TOWEL DISPOSABLE) ×2 IMPLANT
TOWEL GREEN STERILE FF (TOWEL DISPOSABLE) ×2 IMPLANT
WATER STERILE IRR 1000ML POUR (IV SOLUTION) ×2 IMPLANT

## 2017-11-09 NOTE — Plan of Care (Signed)
  Problem: Education: Goal: Knowledge of General Education information will improve Description Including pain rating scale, medication(s)/side effects and non-pharmacologic comfort measures Outcome: Progressing   

## 2017-11-09 NOTE — Transfer of Care (Signed)
Immediate Anesthesia Transfer of Care Note  Patient: [name] [name]  Procedure(s) Performed: Left Lumbar Two-Three Microdiscectomy (Left Back)  Patient Location: PACU  Anesthesia Type:General  Level of Consciousness: awake and alert   Airway & Oxygen Therapy: Patient Spontanous Breathing and Patient connected to nasal cannula oxygen  Post-op Assessment: Report given to RN, Post -op Vital signs reviewed and stable and Patient moving all extremities X 4  Post vital signs: Reviewed and stable  Last Vitals:  Vitals Value Taken Time  BP 116/65 11/09/2017  5:15 PM  Temp    Pulse 78 11/09/2017  5:21 PM  Resp 19 11/09/2017  5:21 PM  SpO2 97 % 11/09/2017  5:21 PM  Vitals shown include unvalidated device data.  Last Pain:  Vitals:   11/09/17 1223  TempSrc:   PainSc: 6       Patients Stated Pain Goal: 6 (25/49/82 6415)  Complications: No apparent anesthesia complications

## 2017-11-09 NOTE — Anesthesia Procedure Notes (Signed)
Procedure Name: Intubation Date/Time: 11/09/2017 3:33 PM Performed by: Inda Coke, CRNA Pre-anesthesia Checklist: Patient identified, Emergency Drugs available, Suction available and Patient being monitored Patient Re-evaluated:Patient Re-evaluated prior to induction Oxygen Delivery Method: Circle System Utilized Preoxygenation: Pre-oxygenation with 100% oxygen Induction Type: IV induction Ventilation: Mask ventilation without difficulty Laryngoscope Size: Mac and 4 Grade View: Grade I Tube type: Oral Tube size: 7.5 mm Number of attempts: 1 Airway Equipment and Method: Stylet and Oral airway Placement Confirmation: ETT inserted through vocal cords under direct vision,  positive ETCO2 and breath sounds checked- equal and bilateral Secured at: 22 cm Tube secured with: Tape Dental Injury: Teeth and Oropharynx as per pre-operative assessment

## 2017-11-09 NOTE — Progress Notes (Signed)
Patient ID: [name] [name], male   DOB: 1941-01-29, 77 y.o.   MRN: [id number] Vital signs are stable Patient is awakening nicely We will mobilize as tolerated

## 2017-11-09 NOTE — Anesthesia Postprocedure Evaluation (Signed)
Anesthesia Post Note  Patient: [name] [name]  Procedure(s) Performed: Left Lumbar Two-Three Microdiscectomy (Left Back)     Patient location during evaluation: PACU Anesthesia Type: General Level of consciousness: awake Pain management: pain level controlled Vital Signs Assessment: post-procedure vital signs reviewed and stable Respiratory status: spontaneous breathing Cardiovascular status: stable Postop Assessment: no apparent nausea or vomiting Anesthetic complications: no    Last Vitals:  Vitals:   11/09/17 1220 11/09/17 1713  BP: 122/83 112/61  Pulse: (!) 55 73  Resp: 18 15  Temp: 36.4 C (!) 36.3 C  SpO2: 98% 96%    Last Pain:  Vitals:   11/09/17 1713  TempSrc:   PainSc: 0-No pain                 Shaylah Mcghie

## 2017-11-09 NOTE — H&P (Signed)
CHIEF COMPLAINT: Ruptured disc, weakness in the legs for about a month.  HISTORY OF PRESENT ILLNESS: [name] is a 77 year old right-handed individual who tells me that he started having problems with his hips 2 years ago.  He had a hip replacement done by Dr. Ronnie Derby on the left side.  He had to have a revision soon after, but he had severe pain that was unrelenting in the left hip since that time.  He subsequently saw Dr. Frederik Pear, who advised another revision and found that he had some avulsion on the trochanter of the bone that created the pain.  He has been undergoing extensive rehabilitation since that time, but then about a month ago developed severe pain in the buttock and right lower extremity, such that standing even briefly tends to exacerbate severe pain in the right leg.  He has not been able to walk since that time and presents in a wheelchair.  He notes that his left leg has chronically been weak and ultimately, because of the pain, an MRI was completed. This study demonstrates the presence of a large extruded fragment of disc on the left side between the L2 and L3 vertebrae.  He is seen now for further consideration of intervention regarding this problem as he notes that he has essentially been nonambulatory for the past several weeks because of the severity of the pain when he tries to stand at all, and he has noted that he has developed weakness in the right leg with a footdrop.  He has seen Dr. Leta Baptist from [facility], and EMG and nerve conduction studies demonstrate on the right leg that there is an L5 and S1 radiculopathy.  The difficulty has been in corroborating the anatomic findings with the patient's clinical exam and EMG nerve conduction studies.  PAST MEDICAL HISTORY: Notable for the hip surgeries alone and a history of high blood pressure.  CURRENT MEDICATIONS: Include Omeprazole, Meloxicam, Fenofibrate, Losartan, Bisoprolol, Finasteride, Flomax, in addition  to some supplements including Magnesium, B12 complex, Aspirin, Vitamin D3, flaxseed oil, fish oil, and Codeine for the back pain.  SYSTEMS REVIEW: Notable for balance disturbance, chronic cough, high cholesterol, swelling in the feet and hands, leg pain while walking, history of urinary tract infections, leg weakness, back pain, leg pain, difficulty in coordination in the legs on a 14-point review sheet.  PHYSICAL EXAMINATION: I note that he demonstrates proximal leg weakness in the iliopsoas and the quad on the left side as testing him from the seated position.  His right-sided quad and iliopsoas is good, but he has a complete footdrop on that right leg.  He cannot demonstrate any tibialis anterior or extensor hallucis longus function on the right leg compared to the left.  The gait cannot be tested in the office today.  IMPRESSION: The patient has been severely incapacitated with a large ruptured disc at L2-3 on the left-hand side.  He has developed severe L5 radiculopathy on the right-hand side with a footdrop.  I believe the large extruded fragment of disc is indeed causing left-sided weakness in addition to the hip pathology that he has, and this area needs to be decompressed. Because of the incapacity that he has, this will likely need to be done on an inpatient basis, followed by an assessment to see how his mobility is. At this time, it appears he is not mobile at all.  He has been getting around the house with a great difficulty, but he will likely need a period  of some rehabilitation in the acute postoperative period to gain his independence again.  We will try to expedite planning the surgical intervention as I do not feel that other conservative measures are likely to yield any improvement.  The concern about the right-sided footdrop remains in limbo and will likely need further reassessment in the postoperative phase if the resolution of that footdrop is not imminent. I do not believe that  adding a steroid injection or any interim treatment is likely to yield much improvement either.

## 2017-11-09 NOTE — Anesthesia Preprocedure Evaluation (Signed)
Anesthesia Evaluation  Patient identified by MRN, date of birth, ID band Patient awake    Reviewed: Allergy & Precautions, NPO status , Patient's Chart, lab work & pertinent test results  Airway Mallampati: IV  TM Distance: >3 FB Neck ROM: Full    Dental  (+) Teeth Intact, Dental Advisory Given   Pulmonary former smoker,    breath sounds clear to auscultation       Cardiovascular hypertension, Pt. on medications  Rhythm:Regular Rate:Normal     Neuro/Psych negative neurological ROS  negative psych ROS   GI/Hepatic Neg liver ROS, GERD  Medicated,  Endo/Other  negative endocrine ROS  Renal/GU Renal disease     Musculoskeletal  (+) Arthritis ,   Abdominal (+) + obese,   Peds  Hematology negative hematology ROS (+)   Anesthesia Other Findings - HLD  Reproductive/Obstetrics                             Anesthesia Physical Anesthesia Plan  ASA: III  Anesthesia Plan: General   Post-op Pain Management:    Induction: Intravenous  PONV Risk Score and Plan: 3 and Ondansetron, Dexamethasone and Treatment may vary due to age or medical condition  Airway Management Planned: Oral ETT  Additional Equipment: None  Intra-op Plan:   Post-operative Plan: Extubation in OR  Informed Consent: I have reviewed the patients History and Physical, chart, labs and discussed the procedure including the risks, benefits and alternatives for the proposed anesthesia with the patient or authorized representative who has indicated his/her understanding and acceptance.   Dental advisory given  Plan Discussed with: CRNA  Anesthesia Plan Comments:         Anesthesia Quick Evaluation

## 2017-11-09 NOTE — Op Note (Signed)
Date of surgery: 11/09/2017 Preoperative diagnosis: Herniated nucleus pulposus L2-3 left with left lumbar radiculopathy Postoperative diagnosis: Same Procedure laminotomy L2-3 left with microdiscectomy using operating microscope microdissection technique Surgeon: Kristeen Miss Anesthesia: General endotracheal Indications: [name] [name] is a 77 year old male who has had significant pain in the region of the left buttock and left leg that he had been attributing to difficulties with a hip arthroplasty.  He had developed rather suddenly her right foot drop.  During the time of the work-up he underwent an MRI of the lumbar spine which demonstrates the presence of a large extruded fragment of disc at L2-3 on the left side.  I noted to him that the disc herniation has nothing to do with the foot drop on the contralateral leg however on examination I noted that he had significant weakness in the iliopsoas and the quadricep on the left side a large fragment of the disc had insinuated itself inferiorly medial to the pedicle of L3 on the left side.  Procedure: The patient was brought to the operating room supine on stretcher.  After the smooth induction of general endotracheal anesthesia he was carefully turned prone onto the operating table.  The back was prepped with alcohol DuraPrep and draped in a sterile fashion.  The left side of the back had been marked preoperatively.  A needle was placed into what was perceived to be the interspace at L2-3 and this was verified radiographically.  Then a vertical incision was created and carried down to the lumbodorsal fascia the fascia was opened on the left side of midline and interlaminar space at L2-3 was exposed.  The limits of the laminotomy were then marked with a second radiograph.  The dissection was then undertaken to remove the inferior margin lamina of L2 and the superior margin lamina of L3 to the region past the pedicle.  Once the dura was identified further  dissection was performed on the operating microscope with a laminotomy being widened in the lateral edge of the common dural tube being gently exposed and gradually mobilized medially as soon as it was mobilized medially a large amount of free disc herniation material was encountered in the epidural space.  This was removed in a piecemeal fashion some of it was noted to be adherent to the dura and required some careful dissection in the end the common dural tube and the takeoff of the L3 nerve root was well decompressed.  The dissection was carried out superiorly to expose the disc space and there was a small tear in the inferior margin of the disc however the disc itself was rather flattened superior to the disc there is noted be a mass but this was fairly flat in the sub-ligamentous fashion and was not open.  With this the dissection was completed the area was inspected carefully no spinal fluid leaks were noted laminotomy was then checked for hemostasis and when verified the retractor was removed the microscope was removed and the lumbodorsal fascia was closed with #1 Vicryl 2-0 Vicryl was used in subtenons tissues 3-0 Vicryl subcuticularly 10 cc of half percent Marcaine was injected into the paraspinous fascia at the time of closure.  Blood loss was estimated 200 cc.

## 2017-11-10 ENCOUNTER — Encounter (HOSPITAL_COMMUNITY): Payer: Self-pay | Admitting: Neurological Surgery

## 2017-11-10 DIAGNOSIS — Z96642 Presence of left artificial hip joint: Secondary | ICD-10-CM | POA: Diagnosis not present

## 2017-11-10 DIAGNOSIS — K219 Gastro-esophageal reflux disease without esophagitis: Secondary | ICD-10-CM | POA: Diagnosis not present

## 2017-11-10 DIAGNOSIS — Z79899 Other long term (current) drug therapy: Secondary | ICD-10-CM | POA: Diagnosis not present

## 2017-11-10 DIAGNOSIS — M5116 Intervertebral disc disorders with radiculopathy, lumbar region: Secondary | ICD-10-CM | POA: Diagnosis not present

## 2017-11-10 DIAGNOSIS — I10 Essential (primary) hypertension: Secondary | ICD-10-CM | POA: Diagnosis not present

## 2017-11-10 DIAGNOSIS — Z87891 Personal history of nicotine dependence: Secondary | ICD-10-CM | POA: Diagnosis not present

## 2017-11-10 DIAGNOSIS — Z7982 Long term (current) use of aspirin: Secondary | ICD-10-CM | POA: Diagnosis not present

## 2017-11-10 DIAGNOSIS — Z791 Long term (current) use of non-steroidal anti-inflammatories (NSAID): Secondary | ICD-10-CM | POA: Diagnosis not present

## 2017-11-10 DIAGNOSIS — E785 Hyperlipidemia, unspecified: Secondary | ICD-10-CM | POA: Diagnosis not present

## 2017-11-10 DIAGNOSIS — M21371 Foot drop, right foot: Secondary | ICD-10-CM | POA: Diagnosis not present

## 2017-11-10 MED ORDER — DEXAMETHASONE 1 MG PO TABS: ORAL_TABLET | ORAL | 0 refills | Status: DC

## 2017-11-10 MED ORDER — METHOCARBAMOL 500 MG PO TABS: 500.0000 mg | ORAL_TABLET | Freq: Four times a day (QID) | ORAL | 3 refills | Status: DC | PRN

## 2017-11-10 MED ORDER — HYDROCODONE-ACETAMINOPHEN 5-325 MG PO TABS: 1.0000 | ORAL_TABLET | ORAL | 0 refills | Status: DC | PRN

## 2017-11-10 NOTE — Care Management Note (Signed)
Case Management Note  Patient Details  Name: EAN GETTEL MRN: [id number] Date of Birth: [birth year]  Subjective/Objective:   Microdiscectomy L2-L3 on left                 Action/Plan: NCM spoke to pt and offered choice for [facility]. Pt states he had [facility] in the past for previous surgeries. He has RW and 3n1 bedside commode at home. Wife and daughter at home to assist in care as needed.   Expected Discharge Date:  11/10/17               Expected Discharge Plan:  [place]  In-House Referral:  NA  Discharge planning Services  CM Consult  Post Acute Care Choice:  Home Health Choice offered to:  Patient  DME Arranged:  N/A DME Agency:  NA  HH Arranged:  PT [place] Agency:  [facility]  Status of Service:  Completed, signed off  If discussed at [place] of Stay Meetings, dates discussed:    Additional Comments:  Erenest Rasher, RN 11/10/2017, 11:16 AM

## 2017-11-10 NOTE — Discharge Instructions (Signed)
Wound Care Leave incision open to air. You may shower. Do not scrub directly on incision.  Do not put any creams, lotions, or ointments on incision. Activity Walk each and every day, increasing distance each day. No lifting greater than 5 lbs.  Avoid Bending, Lifting and Twisting No driving for 2 weeks; may ride as a passenger locally.  Diet Resume your normal diet.  Return to Work Will be discussed at you follow up appointment. Call Your Doctor If Any of These Occur Redness, drainage, or swelling at the wound.  Temperature greater than 101 degrees. Severe pain not relieved by pain medication. Increased difficulty swallowing. Incision starts to come apart. Follow Up Appt Call today for appointment in -1-2 weeks ([phone]) or for problems.  If you have any hardware placed in your spine, you will need an x-ray before your appointment.

## 2017-11-10 NOTE — Progress Notes (Signed)
Physical Therapy Evaluation Patient Details Name: [name] [name] MRN: [id number] DOB: Jul 01, 1940 Today's Date: 11/10/2017   History of Present Illness  77 yo male s/p  laminotomy L2-3 left with microdiscectomy. PMH including right and left THA.   Clinical Impression  Patient is s/p above surgery resulting in deficits listed below (see PT Problem List). PTA, pt reported mobilizing in w/c for past 2 weeks due to pain severity and has only had access to bedroom and bathroom. At time of evaluation pt ambulated approximately 200' with gross min G and RW for safety. Pt perseverating on stairs throughout session as he would like to be able to access entire house at d/c. Pt able to perform stairs with min-A for RW management, but pt reports he is unsure if wife could assist with RW. Believe pt is appropriate for d/c to home with HHPT to improve aerobic endurance and safety with stair negotiation. Will continue to follow acutely to increase their independence and safety with mobility to allow discharge to the venue listed below.       Follow Up Recommendations Home health PT;Supervision/Assistance - 24 hour    Equipment Recommendations  None recommended by PT    Recommendations for Other Services       Precautions / Restrictions Precautions Precautions: Fall;Back Precaution Booklet Issued: Yes (comment) Precaution Comments: Reviewed back precautions with pt. Restrictions Weight Bearing Restrictions: No      Mobility  Bed Mobility Overal bed mobility: Needs Assistance Bed Mobility: Supine to Sit     Supine to sit: Supervision     General bed mobility comments: Seated in chair upon arrival  Transfers Overall transfer level: Needs assistance Equipment used: Rolling walker (2 wheeled) Transfers: Sit to/from Stand Sit to Stand: Min guard         General transfer comment: Min Guard A for safety. Pt attempting to stand before RW in close proximity to him.    Ambulation/Gait Ambulation/Gait assistance: Min guard Gait Distance (Feet): 200 Feet Assistive device: Rolling walker (2 wheeled) Gait Pattern/deviations: Step-to pattern;Decreased step length - left;Decreased stance time - left;Decreased dorsiflexion - left;Decreased weight shift to left;Antalgic;Trunk flexed Gait velocity: decreased Gait velocity interpretation: <1.31 ft/sec, indicative of household ambulator General Gait Details: Pt slow and guarded throughout session. Multimodal cues required for maintenence of precautions, upright posture, and proximity to RW. Encouraged step-through gait pattern but pt reports it increased pain in RLE. Pt with poor carry over as he continued to flex forward. Pt continued to perseverate on everted foot on R decreasing forward gaze. Possible leg length descrepency on the R from past 3 previous surgeries, but did not formally assess.   Stairs Stairs: Yes Stairs assistance: Min assist; Stair Management: One rail Left;Step to pattern;Backwards;Sideways;Forwards;With walker Number of Stairs: 5 General stair comments: Attempted to negotiate stairs sideways in order for pt to utilize Bil UE support on L rail. Pt unable to get both feet on step and prevent twisting. Instructed pt in ascending stairs backwards with RW in front. Min cues for stronger LE leading with ascent and weaker LE leading with descent, but pt reports he was unsure which was weaker. Pt required min  A ascending backwards with cues for sequencing of RW. Pt reports his wife may not be able to stabilize RW at home for him to navigate steps backwards. Verbally discussed managing 2 stairs with RW in front and use of rail on right for steps inside home. Also discussed safety concerns with navigating stairs forwards with RW in  front as pt reports he had done in the past due to risk for falls.   Wheelchair Mobility    Modified Rankin (Stroke Patients Only)       Balance Overall balance  assessment: Needs assistance Sitting-balance support: No upper extremity supported;Feet supported Sitting balance-Leahy Scale: Good     Standing balance support: Bilateral upper extremity supported;During functional activity Standing balance-Leahy Scale: Poor Standing balance comment: Reliant on UE support                             Pertinent Vitals/Pain Pain Assessment: Faces Faces Pain Scale: Hurts even more Pain Location: Back Pain Descriptors / Indicators: Discomfort;Heaviness;Operative site guarding Pain Intervention(s): Limited activity within patient's tolerance;Monitored during session    [place] expects to be discharged to:: Private residence Living Arrangements: Spouse/significant other Available Help at Discharge: Family Type of Home: House Home Access: Stairs to enter;Other (comment)(Temporary ramp ) Entrance Stairs-Rails: Left Entrance Stairs-Number of Steps: 4 Home Layout: Multi-level Home Equipment: Walker - 2 wheels;Cane - single point;Shower seat;Bedside commode;Wheelchair - manual Additional Comments: Pt has a temporary ramp that was put in two weeks ago to enter and exit house with use of w/c. Pt requires daughter's help in order to get up ramp.     Prior Function Level of Independence: Independent         Comments: Reports wife has scoliosis and unable to physically assist. Daughter able to provide some physical assist if needed but mainly limited to assist father up ramp in w/c.      Hand Dominance   Dominant Hand: Right    Extremity/Trunk Assessment   Upper Extremity Assessment Upper Extremity Assessment: Defer to OT evaluation    Lower Extremity Assessment Lower Extremity Assessment: RLE deficits/detail;LLE deficits/detail RLE Deficits / Details: Pt reports weakness in RLE.  LLE Deficits / Details: Pt reports increased pain and weakness in LLE following surgery. Noted L foot in everted position throughout  ambulation.    Cervical / Trunk Assessment Cervical / Trunk Assessment: Other exceptions Cervical / Trunk Exceptions: s/p  laminotomy L2-3 left with microdiscectomy   Communication   Communication: No difficulties  Cognition Arousal/Alertness: Awake/alert Behavior During Therapy: WFL for tasks assessed/performed Overall Cognitive Status: Within Functional Limits for tasks assessed                                 General Comments: Pt perseverating on concerns with stairs secodnary to bil LE weakness       General Comments      Exercises     Assessment/Plan    PT Assessment Patient needs continued PT services  PT Problem List Decreased strength;Decreased range of motion;Decreased activity tolerance;Decreased balance;Decreased mobility;Decreased coordination;Decreased knowledge of use of DME;Decreased safety awareness;Decreased knowledge of precautions;Pain       PT Treatment Interventions DME instruction;Gait training;Stair training;Functional mobility training;Therapeutic activities;Balance training;Patient/family education;Modalities    PT Goals (Current goals can be found in the Care Plan section)  Acute Rehab PT Goals Patient Stated Goal: Go home PT Goal Formulation: With patient Time For Goal Achievement: 11/17/17 Potential to Achieve Goals: Good    Frequency Min 5X/week   Barriers to discharge   Pt able to use ramp at d/c and can access bedroom and bathroom without needing to navifate steps. Pt reports he would like to access the entire home though which would require him to  manage 1-2 steps in different areas of the house.     Co-evaluation               AM-PAC PT "6 Clicks" Daily Activity  Outcome Measure Difficulty turning over in bed (including adjusting bedclothes, sheets and blankets)?: A Little Difficulty moving from lying on back to sitting on the side of the bed? : A Little Difficulty sitting down on and standing up from a chair with  arms (e.g., wheelchair, bedside commode, etc,.)?: Unable Help needed moving to and from a bed to chair (including a wheelchair)?: A Little Help needed walking in hospital room?: A Little Help needed climbing 3-5 steps with a railing? : A Lot 6 Click Score: 15    End of Session Equipment Utilized During Treatment: Gait belt Activity Tolerance: Patient limited by fatigue Patient left: in chair;with call bell/phone within reach Nurse Communication: Mobility status PT Visit Diagnosis: Unsteadiness on feet (R26.81);Other abnormalities of gait and mobility (R26.89);Muscle weakness (generalized) (M62.81);Difficulty in walking, not elsewhere classified (R26.2);Pain Pain - Right/Left: Left Pain - part of body: Leg(back)    Time: 4496-7591 PT Time Calculation (min) (ACUTE ONLY): 33 min   Charges:   PT Evaluation $PT Eval Moderate Complexity: 1 Mod PT Treatments $Gait Training: 8-22 mins        Einar Crow, [place]  Student Physical Therapist Acute Rehab [phone]   Einar Crow 11/10/2017, 11:06 AM

## 2017-11-10 NOTE — Progress Notes (Signed)
Patient ID: [name] [name], male   DOB: 01-Oct-1940, 77 y.o.   MRN: [id number] Vital signs are stable Still having weakness in both lower extremities particularly the right with the foot drop Nonetheless he does appear to be getting somewhat better We will plan on discharge home with outpatient PT as home health

## 2017-11-10 NOTE — Discharge Summary (Signed)
Physician Discharge Summary  Patient ID: [name] [name] MRN: [id number] DOB/AGE: 1940-04-19 77 y.o.  Admit date: 11/09/2017 Discharge date: 11/10/2017  Admission Diagnoses: Herniated nucleus pulposus L2-3 left left lumbar radiculopathy, right foot drop  Discharge Diagnoses: Herniated nucleus pulposus L2-3 left left lumbar radiculopathy, right foot drop Active Problems:   Herniated nucleus pulposus, L2-3 left   Discharged Condition: good  Hospital Course: Patient was admitted to undergo surgical decompression of a large extruded fragment of disc at L2-3 on the left.  He had significant proximal leg weakness.  The weakness was both preoperatively and postoperatively.  Consults: None  Significant Diagnostic Studies: None  Treatments: surgery: Microdiscectomy L2-3 left  Discharge Exam: Blood pressure 136/80, pulse 64, temperature 97.7 F (36.5 C), temperature source Oral, resp. rate 18, height 5\' 11"  (1.803 m), weight 113.4 kg, SpO2 97 %. Incision is clean and dry.  Motor function reveals right foot drop, weakness in the iliopsoas and quadricep on the left at 4- out of 5.  Disposition: Discharge disposition: 01-Home or Self Care       Discharge Instructions    Call MD for:  redness, tenderness, or signs of infection (pain, swelling, redness, odor or green/yellow discharge around incision site)   Complete by:  As directed    Call MD for:  severe uncontrolled pain   Complete by:  As directed    Call MD for:  temperature >100.4   Complete by:  As directed    Diet - low sodium heart healthy   Complete by:  As directed    Incentive spirometry RT   Complete by:  As directed    Increase activity slowly   Complete by:  As directed      Allergies as of 11/10/2017      Reactions   Iodinated Diagnostic Agents Shortness Of Breath   Iohexol Shortness Of Breath      Medication List    TAKE these medications   aspirin EC 81 MG tablet Take 81 mg by mouth daily.   b complex  vitamins tablet Take 1 tablet by mouth daily.   bisoprolol-hydrochlorothiazide 5-6.25 MG tablet Commonly known as:  ZIAC TAKE 1 TABLET BY MOUTH EVERY MORNING FOR BLOOD PRESSURE What changed:  See the new instructions.   dexamethasone 1 MG tablet Commonly known as:  DECADRON 2 tablets twice daily for 2 days, one tablet twice daily for 2 days, one tablet daily for 2 days.   fenofibrate 145 MG tablet Commonly known as:  TRICOR TAKE 1 TABLET BY MOUTH DAILY FOR TRIGLYCERIDES/BLOOD FATS What changed:  See the new instructions.   finasteride 5 MG tablet Commonly known as:  PROSCAR Take 5 mg by mouth daily.   Fish Oil 1200 MG Caps Take 1,200 mg by mouth daily.   FLAX SEEDS PO Take 1,000 mg by mouth daily.   HYDROcodone-acetaminophen 5-325 MG tablet Commonly known as:  NORCO/VICODIN Take 1-2 tablets by mouth every 4 (four) hours as needed for moderate pain or severe pain. What changed:    how much to take  when to take this  reasons to take this   losartan 100 MG tablet Commonly known as:  COZAAR TAKE 1 TABLET BY MOUTH DAILY FOR BLOOD PRESSURE.   magnesium oxide 400 MG tablet Commonly known as:  MAG-OX Take 400 mg by mouth daily.   meloxicam 15 MG tablet Commonly known as:  MOBIC Take 1/2 to 1 tablet daily with food for pain & Inflammation What changed:  how much to take  how to take this  when to take this   methocarbamol 500 MG tablet Commonly known as:  ROBAXIN Take 1 tablet (500 mg total) by mouth every 6 (six) hours as needed for muscle spasms.   oxyCODONE-acetaminophen 10-325 MG tablet Commonly known as:  PERCOCET Take 1 tablet by mouth 2 (two) times daily.   pantoprazole 40 MG tablet Commonly known as:  PROTONIX Take 1 tablet (40 mg total) by mouth daily.   tamsulosin 0.4 MG Caps capsule Commonly known as:  FLOMAX Take 0.4 mg by mouth daily after breakfast.   vitamin B-12 1000 MCG tablet Commonly known as:  CYANOCOBALAMIN Take 1 tablet (1,000  mcg total) by mouth daily.   VITAMIN D PO Take 1 tablet by mouth 2 (two) times daily.        SignedEarleen Newport 11/10/2017, 9:34 AM

## 2017-11-10 NOTE — Evaluation (Signed)
Occupational Therapy Evaluation Patient Details Name: [name] [name] MRN: [id number] DOB: [birth year] Today's Date: 11/10/2017    History of Present Illness 77 yo male s/p  laminotomy L2-3 left with microdiscectomy. PMH including right and left THA.    Clinical Impression   PTA, pt was living with his wife and was independent. Currently, pt requires Supervision-Min Guard A for ADLs and functional mobility using RW. Provided education on back precautions, bed mobility, LB ADLs, toileting, and shower transfer with shower seat. Pt requiring increased cues for adherence to back precautions. Reviewed importance of adherence to back precautions during ADLs, bed mobility, and functional mobility. Answered all pt questions. Recommend dc home once medically stable per physician. All acute OT needs met and will sign off. Thank you.     Follow Up Recommendations  Follow surgeon's recommendation for DC plan and follow-up therapies;Supervision/Assistance - 24 hour    Equipment Recommendations  None recommended by OT    Recommendations for Other Services PT consult     Precautions / Restrictions Precautions Precautions: Fall;Back Precaution Booklet Issued: Yes (comment) Precaution Comments: Reviewed back precautions. Pt requiring cues during session for no twisting. Restrictions Weight Bearing Restrictions: No      Mobility Bed Mobility Overal bed mobility: Needs Assistance Bed Mobility: Supine to Sit     Supine to sit: Supervision     General bed mobility comments: Educating pt on log roll technique. However, pt perform supine to sit without following educational cues. Reviewed importance of adherance to precautions.   Transfers Overall transfer level: Needs assistance Equipment used: Rolling walker (2 wheeled) Transfers: Sit to/from Stand Sit to Stand: Min guard         General transfer comment: Min Guard A for safety. Pt wanting to perform transfer without assistance and  required several attempts to weight shift into standing    Balance Overall balance assessment: Needs assistance Sitting-balance support: No upper extremity supported;Feet supported Sitting balance-Leahy Scale: Good     Standing balance support: Bilateral upper extremity supported;During functional activity Standing balance-Leahy Scale: Poor Standing balance comment: Reliant on UE support                           ADL either performed or assessed with clinical judgement   ADL Overall ADL's : Needs assistance/impaired                                       General ADL Comments: Pt performing ADLs and fucntional mobility at supervision-Min Guard A level. Providing pt with education on compensatory techniques for LB ADLs, bed mobility, toileting, and functional transfers. Pt donning clothing and follow educational cues for no bending. Reuqired cues for no twisting. Pt presenting with poor adherance and tendency to perform tasks at he wants despite cues and education.     Vision         Perception     Praxis      Pertinent Vitals/Pain Pain Assessment: Faces Faces Pain Scale: Hurts a little bit Pain Location: Back Pain Descriptors / Indicators: Discomfort Pain Intervention(s): Monitored during session;Repositioned     Hand Dominance Right   Extremity/Trunk Assessment Upper Extremity Assessment Upper Extremity Assessment: Overall WFL for tasks assessed   Lower Extremity Assessment Lower Extremity Assessment: Defer to PT evaluation   Cervical / Trunk Assessment Cervical / Trunk Assessment: Other exceptions Cervical / Trunk Exceptions:  s/p  laminotomy L2-3 left with microdiscectomy    Communication Communication Communication: No difficulties   Cognition Arousal/Alertness: Awake/alert Behavior During Therapy: WFL for tasks assessed/performed Overall Cognitive Status: Within Functional Limits for tasks assessed                                  General Comments: Pt with decreased following of back precautions and poor use of compensatory techniques.    General Comments   Pt receiving several phone calls during session.    Exercises     Shoulder Instructions      Home Living Family/patient expects to be discharged to:: Private residence Living Arrangements: Spouse/significant other Available Help at Discharge: Family Type of Home: House Home Access: Stairs to enter     Home Layout: Multi-level     Bathroom Shower/Tub: Hospital doctor Toilet: Handicapped height     Home Equipment: Environmental consultant - 2 wheels;Cane - single point;Shower seat;Bedside commode   Additional Comments: new comfort height commodes      Prior Functioning/Environment Level of Independence: Independent        Comments: ADLs, IADLs, works in Occupational psychologist. wife is unsteady and uses cane        OT Problem List: Impaired balance (sitting and/or standing);Decreased safety awareness;Decreased knowledge of use of DME or AE;Decreased knowledge of precautions;Decreased range of motion;Pain      OT Treatment/Interventions:      OT Goals(Current goals can be found in the care plan section) Acute Rehab OT Goals Patient Stated Goal: Go home OT Goal Formulation: All assessment and education complete, DC therapy  OT Frequency:     Barriers to D/C:            Co-evaluation              AM-PAC PT "6 Clicks" Daily Activity     Outcome Measure Help from another person eating meals?: None Help from another person taking care of personal grooming?: None Help from another person toileting, which includes using toliet, bedpan, or urinal?: A Little Help from another person bathing (including washing, rinsing, drying)?: A Little Help from another person to put on and taking off regular upper body clothing?: None Help from another person to put on and taking off regular lower body clothing?: A Little 6 Click Score: 21   End of  Session Equipment Utilized During Treatment: Rolling walker Nurse Communication: Precautions;Mobility status  Activity Tolerance: Patient tolerated treatment well Patient left: in chair;with call bell/phone within reach  OT Visit Diagnosis: Unsteadiness on feet (R26.81);Other abnormalities of gait and mobility (R26.89);Muscle weakness (generalized) (M62.81);Pain Pain - part of body: (Back)                Time: 2694-8546 OT Time Calculation (min): 11 min Charges:  OT General Charges $OT Visit: 1 Visit OT Evaluation $OT Eval Low Complexity: [place], OTR/L Acute Rehab Pager: [phone] Office: [place] 11/10/2017, 9:06 AM

## 2017-11-10 NOTE — Progress Notes (Signed)
Patient is discharged from room 3C09 at this time. Alert and in stable condition. IV site d/c'd and instructions read to patient and daughter with understanding verbalized. Left unit via wheelchair with all belongings at side.

## 2017-11-11 DIAGNOSIS — Z79891 Long term (current) use of opiate analgesic: Secondary | ICD-10-CM | POA: Diagnosis not present

## 2017-11-11 DIAGNOSIS — Z4789 Encounter for other orthopedic aftercare: Secondary | ICD-10-CM | POA: Diagnosis not present

## 2017-11-11 DIAGNOSIS — M21371 Foot drop, right foot: Secondary | ICD-10-CM | POA: Diagnosis not present

## 2017-11-11 DIAGNOSIS — M5416 Radiculopathy, lumbar region: Secondary | ICD-10-CM | POA: Diagnosis not present

## 2017-11-11 DIAGNOSIS — Z96643 Presence of artificial hip joint, bilateral: Secondary | ICD-10-CM | POA: Diagnosis not present

## 2017-11-11 DIAGNOSIS — Z9181 History of falling: Secondary | ICD-10-CM | POA: Diagnosis not present

## 2017-11-11 DIAGNOSIS — Z7982 Long term (current) use of aspirin: Secondary | ICD-10-CM | POA: Diagnosis not present

## 2017-11-15 DIAGNOSIS — M5416 Radiculopathy, lumbar region: Secondary | ICD-10-CM | POA: Diagnosis not present

## 2017-11-15 DIAGNOSIS — Z4789 Encounter for other orthopedic aftercare: Secondary | ICD-10-CM | POA: Diagnosis not present

## 2017-11-15 DIAGNOSIS — Z79891 Long term (current) use of opiate analgesic: Secondary | ICD-10-CM | POA: Diagnosis not present

## 2017-11-15 DIAGNOSIS — Z9181 History of falling: Secondary | ICD-10-CM | POA: Diagnosis not present

## 2017-11-15 DIAGNOSIS — Z7982 Long term (current) use of aspirin: Secondary | ICD-10-CM | POA: Diagnosis not present

## 2017-11-15 DIAGNOSIS — Z96643 Presence of artificial hip joint, bilateral: Secondary | ICD-10-CM | POA: Diagnosis not present

## 2017-11-15 DIAGNOSIS — M21371 Foot drop, right foot: Secondary | ICD-10-CM | POA: Diagnosis not present

## 2017-11-17 DIAGNOSIS — M5416 Radiculopathy, lumbar region: Secondary | ICD-10-CM | POA: Diagnosis not present

## 2017-11-17 DIAGNOSIS — Z4789 Encounter for other orthopedic aftercare: Secondary | ICD-10-CM | POA: Diagnosis not present

## 2017-11-17 DIAGNOSIS — Z96643 Presence of artificial hip joint, bilateral: Secondary | ICD-10-CM | POA: Diagnosis not present

## 2017-11-17 DIAGNOSIS — Z79891 Long term (current) use of opiate analgesic: Secondary | ICD-10-CM | POA: Diagnosis not present

## 2017-11-17 DIAGNOSIS — Z7982 Long term (current) use of aspirin: Secondary | ICD-10-CM | POA: Diagnosis not present

## 2017-11-17 DIAGNOSIS — M21371 Foot drop, right foot: Secondary | ICD-10-CM | POA: Diagnosis not present

## 2017-11-17 DIAGNOSIS — Z9181 History of falling: Secondary | ICD-10-CM | POA: Diagnosis not present

## 2017-11-22 DIAGNOSIS — M5416 Radiculopathy, lumbar region: Secondary | ICD-10-CM | POA: Diagnosis not present

## 2017-11-22 DIAGNOSIS — Z96643 Presence of artificial hip joint, bilateral: Secondary | ICD-10-CM | POA: Diagnosis not present

## 2017-11-22 DIAGNOSIS — M21371 Foot drop, right foot: Secondary | ICD-10-CM | POA: Diagnosis not present

## 2017-11-22 DIAGNOSIS — Z4789 Encounter for other orthopedic aftercare: Secondary | ICD-10-CM | POA: Diagnosis not present

## 2017-11-22 DIAGNOSIS — Z9181 History of falling: Secondary | ICD-10-CM | POA: Diagnosis not present

## 2017-11-22 DIAGNOSIS — Z7982 Long term (current) use of aspirin: Secondary | ICD-10-CM | POA: Diagnosis not present

## 2017-11-22 DIAGNOSIS — Z79891 Long term (current) use of opiate analgesic: Secondary | ICD-10-CM | POA: Diagnosis not present

## 2017-11-25 DIAGNOSIS — Z7982 Long term (current) use of aspirin: Secondary | ICD-10-CM | POA: Diagnosis not present

## 2017-11-25 DIAGNOSIS — Z9181 History of falling: Secondary | ICD-10-CM | POA: Diagnosis not present

## 2017-11-25 DIAGNOSIS — Z96643 Presence of artificial hip joint, bilateral: Secondary | ICD-10-CM | POA: Diagnosis not present

## 2017-11-25 DIAGNOSIS — Z79891 Long term (current) use of opiate analgesic: Secondary | ICD-10-CM | POA: Diagnosis not present

## 2017-11-25 DIAGNOSIS — M21371 Foot drop, right foot: Secondary | ICD-10-CM | POA: Diagnosis not present

## 2017-11-25 DIAGNOSIS — M5416 Radiculopathy, lumbar region: Secondary | ICD-10-CM | POA: Diagnosis not present

## 2017-11-25 DIAGNOSIS — Z4789 Encounter for other orthopedic aftercare: Secondary | ICD-10-CM | POA: Diagnosis not present

## 2017-11-29 DIAGNOSIS — Z79891 Long term (current) use of opiate analgesic: Secondary | ICD-10-CM | POA: Diagnosis not present

## 2017-11-29 DIAGNOSIS — M5416 Radiculopathy, lumbar region: Secondary | ICD-10-CM | POA: Diagnosis not present

## 2017-11-29 DIAGNOSIS — Z9181 History of falling: Secondary | ICD-10-CM | POA: Diagnosis not present

## 2017-11-29 DIAGNOSIS — Z96643 Presence of artificial hip joint, bilateral: Secondary | ICD-10-CM | POA: Diagnosis not present

## 2017-11-29 DIAGNOSIS — Z7982 Long term (current) use of aspirin: Secondary | ICD-10-CM | POA: Diagnosis not present

## 2017-11-29 DIAGNOSIS — Z4789 Encounter for other orthopedic aftercare: Secondary | ICD-10-CM | POA: Diagnosis not present

## 2017-11-29 DIAGNOSIS — M21371 Foot drop, right foot: Secondary | ICD-10-CM | POA: Diagnosis not present

## 2017-12-01 DIAGNOSIS — Z79891 Long term (current) use of opiate analgesic: Secondary | ICD-10-CM | POA: Diagnosis not present

## 2017-12-01 DIAGNOSIS — Z9181 History of falling: Secondary | ICD-10-CM | POA: Diagnosis not present

## 2017-12-01 DIAGNOSIS — Z96643 Presence of artificial hip joint, bilateral: Secondary | ICD-10-CM | POA: Diagnosis not present

## 2017-12-01 DIAGNOSIS — Z7982 Long term (current) use of aspirin: Secondary | ICD-10-CM | POA: Diagnosis not present

## 2017-12-01 DIAGNOSIS — M21371 Foot drop, right foot: Secondary | ICD-10-CM | POA: Diagnosis not present

## 2017-12-01 DIAGNOSIS — M5416 Radiculopathy, lumbar region: Secondary | ICD-10-CM | POA: Diagnosis not present

## 2017-12-01 DIAGNOSIS — Z4789 Encounter for other orthopedic aftercare: Secondary | ICD-10-CM | POA: Diagnosis not present

## 2017-12-06 DIAGNOSIS — Z96643 Presence of artificial hip joint, bilateral: Secondary | ICD-10-CM | POA: Diagnosis not present

## 2017-12-06 DIAGNOSIS — M21371 Foot drop, right foot: Secondary | ICD-10-CM | POA: Diagnosis not present

## 2017-12-06 DIAGNOSIS — Z4789 Encounter for other orthopedic aftercare: Secondary | ICD-10-CM | POA: Diagnosis not present

## 2017-12-06 DIAGNOSIS — M5416 Radiculopathy, lumbar region: Secondary | ICD-10-CM | POA: Diagnosis not present

## 2017-12-06 DIAGNOSIS — Z7982 Long term (current) use of aspirin: Secondary | ICD-10-CM | POA: Diagnosis not present

## 2017-12-06 DIAGNOSIS — Z79891 Long term (current) use of opiate analgesic: Secondary | ICD-10-CM | POA: Diagnosis not present

## 2017-12-06 DIAGNOSIS — Z9181 History of falling: Secondary | ICD-10-CM | POA: Diagnosis not present

## 2017-12-08 DIAGNOSIS — Z79891 Long term (current) use of opiate analgesic: Secondary | ICD-10-CM | POA: Diagnosis not present

## 2017-12-08 DIAGNOSIS — Z4789 Encounter for other orthopedic aftercare: Secondary | ICD-10-CM | POA: Diagnosis not present

## 2017-12-08 DIAGNOSIS — Z9181 History of falling: Secondary | ICD-10-CM | POA: Diagnosis not present

## 2017-12-08 DIAGNOSIS — Z7982 Long term (current) use of aspirin: Secondary | ICD-10-CM | POA: Diagnosis not present

## 2017-12-08 DIAGNOSIS — M5416 Radiculopathy, lumbar region: Secondary | ICD-10-CM | POA: Diagnosis not present

## 2017-12-08 DIAGNOSIS — M21371 Foot drop, right foot: Secondary | ICD-10-CM | POA: Diagnosis not present

## 2017-12-08 DIAGNOSIS — Z96643 Presence of artificial hip joint, bilateral: Secondary | ICD-10-CM | POA: Diagnosis not present

## 2017-12-15 DIAGNOSIS — R531 Weakness: Secondary | ICD-10-CM | POA: Diagnosis not present

## 2017-12-15 DIAGNOSIS — Z4789 Encounter for other orthopedic aftercare: Secondary | ICD-10-CM | POA: Diagnosis not present

## 2017-12-15 DIAGNOSIS — Z96642 Presence of left artificial hip joint: Secondary | ICD-10-CM | POA: Diagnosis not present

## 2017-12-15 DIAGNOSIS — M5126 Other intervertebral disc displacement, lumbar region: Secondary | ICD-10-CM | POA: Diagnosis not present

## 2017-12-20 DIAGNOSIS — Z4789 Encounter for other orthopedic aftercare: Secondary | ICD-10-CM | POA: Diagnosis not present

## 2017-12-20 DIAGNOSIS — Z96642 Presence of left artificial hip joint: Secondary | ICD-10-CM | POA: Diagnosis not present

## 2017-12-20 DIAGNOSIS — M5126 Other intervertebral disc displacement, lumbar region: Secondary | ICD-10-CM | POA: Diagnosis not present

## 2017-12-20 DIAGNOSIS — R531 Weakness: Secondary | ICD-10-CM | POA: Diagnosis not present

## 2017-12-22 DIAGNOSIS — R531 Weakness: Secondary | ICD-10-CM | POA: Diagnosis not present

## 2017-12-22 DIAGNOSIS — Z96642 Presence of left artificial hip joint: Secondary | ICD-10-CM | POA: Diagnosis not present

## 2017-12-22 DIAGNOSIS — Z4789 Encounter for other orthopedic aftercare: Secondary | ICD-10-CM | POA: Diagnosis not present

## 2017-12-22 DIAGNOSIS — M5126 Other intervertebral disc displacement, lumbar region: Secondary | ICD-10-CM | POA: Diagnosis not present

## 2017-12-27 DIAGNOSIS — M5126 Other intervertebral disc displacement, lumbar region: Secondary | ICD-10-CM | POA: Diagnosis not present

## 2017-12-27 DIAGNOSIS — R531 Weakness: Secondary | ICD-10-CM | POA: Diagnosis not present

## 2017-12-27 DIAGNOSIS — Z96642 Presence of left artificial hip joint: Secondary | ICD-10-CM | POA: Diagnosis not present

## 2017-12-27 DIAGNOSIS — Z4789 Encounter for other orthopedic aftercare: Secondary | ICD-10-CM | POA: Diagnosis not present

## 2017-12-28 ENCOUNTER — Encounter: Payer: Self-pay | Admitting: Internal Medicine

## 2017-12-29 DIAGNOSIS — Z4789 Encounter for other orthopedic aftercare: Secondary | ICD-10-CM | POA: Diagnosis not present

## 2017-12-29 DIAGNOSIS — Z96642 Presence of left artificial hip joint: Secondary | ICD-10-CM | POA: Diagnosis not present

## 2017-12-29 DIAGNOSIS — M5126 Other intervertebral disc displacement, lumbar region: Secondary | ICD-10-CM | POA: Diagnosis not present

## 2017-12-29 DIAGNOSIS — R531 Weakness: Secondary | ICD-10-CM | POA: Diagnosis not present

## 2018-01-03 DIAGNOSIS — Z96642 Presence of left artificial hip joint: Secondary | ICD-10-CM | POA: Diagnosis not present

## 2018-01-03 DIAGNOSIS — R531 Weakness: Secondary | ICD-10-CM | POA: Diagnosis not present

## 2018-01-03 DIAGNOSIS — M5126 Other intervertebral disc displacement, lumbar region: Secondary | ICD-10-CM | POA: Diagnosis not present

## 2018-01-03 DIAGNOSIS — Z4789 Encounter for other orthopedic aftercare: Secondary | ICD-10-CM | POA: Diagnosis not present

## 2018-01-04 DIAGNOSIS — R531 Weakness: Secondary | ICD-10-CM | POA: Diagnosis not present

## 2018-01-04 DIAGNOSIS — Z4789 Encounter for other orthopedic aftercare: Secondary | ICD-10-CM | POA: Diagnosis not present

## 2018-01-04 DIAGNOSIS — M5126 Other intervertebral disc displacement, lumbar region: Secondary | ICD-10-CM | POA: Diagnosis not present

## 2018-01-04 DIAGNOSIS — Z96642 Presence of left artificial hip joint: Secondary | ICD-10-CM | POA: Diagnosis not present

## 2018-01-11 DIAGNOSIS — R531 Weakness: Secondary | ICD-10-CM | POA: Diagnosis not present

## 2018-01-11 DIAGNOSIS — M5126 Other intervertebral disc displacement, lumbar region: Secondary | ICD-10-CM | POA: Diagnosis not present

## 2018-01-11 DIAGNOSIS — Z96642 Presence of left artificial hip joint: Secondary | ICD-10-CM | POA: Diagnosis not present

## 2018-01-11 DIAGNOSIS — Z4789 Encounter for other orthopedic aftercare: Secondary | ICD-10-CM | POA: Diagnosis not present

## 2018-01-12 DIAGNOSIS — M25552 Pain in left hip: Secondary | ICD-10-CM | POA: Diagnosis not present

## 2018-01-13 ENCOUNTER — Ambulatory Visit (INDEPENDENT_AMBULATORY_CARE_PROVIDER_SITE_OTHER): Payer: Medicare Other | Admitting: Internal Medicine

## 2018-01-13 ENCOUNTER — Encounter: Payer: Self-pay | Admitting: Adult Health

## 2018-01-13 ENCOUNTER — Encounter: Payer: Self-pay | Admitting: Internal Medicine

## 2018-01-13 VITALS — BP 120/86 | HR 86 | Temp 98.5°F | Resp 16 | Ht 71.0 in | Wt 258.0 lb

## 2018-01-13 DIAGNOSIS — Z8249 Family history of ischemic heart disease and other diseases of the circulatory system: Secondary | ICD-10-CM

## 2018-01-13 DIAGNOSIS — E559 Vitamin D deficiency, unspecified: Secondary | ICD-10-CM | POA: Diagnosis not present

## 2018-01-13 DIAGNOSIS — E782 Mixed hyperlipidemia: Secondary | ICD-10-CM | POA: Diagnosis not present

## 2018-01-13 DIAGNOSIS — I10 Essential (primary) hypertension: Secondary | ICD-10-CM | POA: Diagnosis not present

## 2018-01-13 DIAGNOSIS — Z0001 Encounter for general adult medical examination with abnormal findings: Secondary | ICD-10-CM

## 2018-01-13 DIAGNOSIS — Z23 Encounter for immunization: Secondary | ICD-10-CM | POA: Diagnosis not present

## 2018-01-13 DIAGNOSIS — R7309 Other abnormal glucose: Secondary | ICD-10-CM

## 2018-01-13 DIAGNOSIS — Z136 Encounter for screening for cardiovascular disorders: Secondary | ICD-10-CM | POA: Diagnosis not present

## 2018-01-13 DIAGNOSIS — N401 Enlarged prostate with lower urinary tract symptoms: Secondary | ICD-10-CM

## 2018-01-13 DIAGNOSIS — R7303 Prediabetes: Secondary | ICD-10-CM

## 2018-01-13 DIAGNOSIS — Z0000 Encounter for general adult medical examination without abnormal findings: Secondary | ICD-10-CM

## 2018-01-13 DIAGNOSIS — K219 Gastro-esophageal reflux disease without esophagitis: Secondary | ICD-10-CM

## 2018-01-13 DIAGNOSIS — N138 Other obstructive and reflux uropathy: Secondary | ICD-10-CM

## 2018-01-13 DIAGNOSIS — Z1211 Encounter for screening for malignant neoplasm of colon: Secondary | ICD-10-CM

## 2018-01-13 DIAGNOSIS — Z6836 Body mass index (BMI) 36.0-36.9, adult: Secondary | ICD-10-CM

## 2018-01-13 DIAGNOSIS — Z125 Encounter for screening for malignant neoplasm of prostate: Secondary | ICD-10-CM

## 2018-01-13 DIAGNOSIS — Z87891 Personal history of nicotine dependence: Secondary | ICD-10-CM

## 2018-01-13 DIAGNOSIS — Z79899 Other long term (current) drug therapy: Secondary | ICD-10-CM

## 2018-01-13 DIAGNOSIS — Z1212 Encounter for screening for malignant neoplasm of rectum: Secondary | ICD-10-CM

## 2018-01-13 MED ORDER — PHENTERMINE HCL 37.5 MG PO TABS: ORAL_TABLET | ORAL | 5 refills | Status: DC

## 2018-01-13 MED ORDER — TOPIRAMATE 50 MG PO TABS: ORAL_TABLET | ORAL | 5 refills | Status: DC

## 2018-01-13 NOTE — Patient Instructions (Addendum)

## 2018-01-13 NOTE — Progress Notes (Signed)
[facility]   Unk Pinto, M.D.     Uvaldo Bristle. Silverio Lay, P.A.-C Liane Comber, [place]                [address] [place], [place] [address] Telephone [phone] Telefax [phone] Annual  Screening/Preventative Visit  & Comprehensive Evaluation & Examination     This very nice 77 y.o. MWM presents for a Screening /Preventative Visit & comprehensive evaluation and management of multiple medical co-morbidities.  Patient has been followed for HTN, HLD,  Prediabetes and Vitamin D Deficiency. Patient underwent a L Superior Parathyroidectomy in May 2016 and also has hx/o lithotripsy for recurrent kidney stones.     In May 2016, he underwent a L Superior Parathyroidectomy and also  lithotripsy for recurrent kidney stones.     HTN predates circa 2002. Patient's BP has been controlled at home.  Today's BP is at goal -  120/86. Patient denies any cardiac symptoms as chest pain, palpitations, shortness of breath, dizziness or ankle swelling.     Patient's hyperlipidemia is controlled with diet and medications. Patient denies myalgias or other medication SE's. Last lipids were  Lab Results  Component Value Date   CHOL 184 01/13/2018   HDL 53 01/13/2018   LDLCALC 97 01/13/2018   TRIG 223 (H) 01/13/2018   CHOLHDL 3.5 01/13/2018      Patient has hx/o prediabetes / Insulin Resistance (A1c 6.4% / Insulin 124 / 2010) and patient denies reactive hypoglycemic symptoms, visual blurring, diabetic polys or paresthesias. Last A1c was not at goal: Lab Results  Component Value Date   HGBA1C 6.6 (H) 01/13/2018       Finally, patient has history of Vitamin D Deficiency  ("36" / 2009)  and last vitamin D was at goal: Lab Results  Component Value Date   VD25OH 57 01/13/2018   Current Outpatient Medications on File Prior to Visit  Medication Sig  . aspirin EC 81 MG tablet Take 81 mg by mouth daily.  Marland Kitchen b  complex vitamins tablet Take 1 tablet by mouth daily.  . bisoprolol-hydrochlorothiazide (ZIAC) 5-6.25 MG tablet TAKE 1 TABLET BY MOUTH EVERY MORNING FOR BLOOD PRESSURE (Patient taking differently: Take 1 tablet by mouth daily. )  . Cholecalciferol (VITAMIN D PO) Take 1 tablet by mouth 2 (two) times daily.   . fenofibrate (TRICOR) 145 MG tablet TAKE 1 TABLET BY MOUTH DAILY FOR TRIGLYCERIDES/BLOOD FATS (Patient taking differently: Take 145 mg by mouth daily. )  . finasteride (PROSCAR) 5 MG tablet Take 5 mg by mouth daily.  . Flaxseed, Linseed, (FLAX SEEDS PO) Take 1,000 mg by mouth daily.   Marland Kitchen HYDROcodone-acetaminophen (NORCO/VICODIN) 5-325 MG tablet Take 1-2 tablets by mouth every 4 (four) hours as needed for moderate pain or severe pain.  Marland Kitchen losartan (COZAAR) 100 MG tablet TAKE 1 TABLET BY MOUTH DAILY FOR BLOOD PRESSURE.  . magnesium oxide (MAG-OX) 400 MG tablet Take 400 mg by mouth daily.   . meloxicam (MOBIC) 15 MG tablet Take 1/2 to 1 tablet daily with food for pain & Inflammation (Patient taking differently: Take 15 mg by mouth daily. Take 1/2 to 1 tablet daily with food for pain & Inflammation)  . methocarbamol (ROBAXIN) 500 MG tablet Take 1 tablet (500 mg total) by mouth every 6 (six) hours as needed for muscle spasms.  . Omega-3 Fatty Acids (FISH OIL) 1200 MG  CAPS Take 1,200 mg by mouth daily.   Marland Kitchen OVER THE COUNTER MEDICATION   . oxyCODONE-acetaminophen (PERCOCET) 10-325 MG tablet Take 1 tablet by mouth 2 (two) times daily.  . tamsulosin (FLOMAX) 0.4 MG CAPS capsule Take 0.4 mg by mouth daily after breakfast.   . vitamin B-12 (CYANOCOBALAMIN) 1000 MCG tablet Take 1 tablet (1,000 mcg total) by mouth daily.   No current facility-administered medications on file prior to visit.    Allergies  Allergen Reactions  . Iodinated Diagnostic Agents Shortness Of Breath  . Iohexol Shortness Of Breath   Past Medical History:  Diagnosis Date  . Arthritis    in left hip  . ED (erectile dysfunction)    . Enlarged prostate with lower urinary tract symptoms (LUTS)   . GERD (gastroesophageal reflux disease)   . History of chronic gastritis   . History of colon polyps    hyperplastic 2006  . History of esophageal stricture    S/P  DILATATION 2009; 2010; 2010 2012  . History of kidney stones    hx. multiple kidney stones  . History of kidney stones   . History of primary hyperparathyroidism    s/p  left superior parathyroidectomy 07-10-2014  . Hyperlipidemia   . Hypertension   . Ileus ([place]) 05/02/2017  . Left ureteral stone   . Pre-diabetes   . Sepsis secondary to UTI [facility]) 05/02/2017   Health Maintenance  Topic Date Due  . COLONOSCOPY  02/13/2020  . TETANUS/TDAP  09/23/2025  . INFLUENZA VACCINE  Completed  . PNA vac Low Risk Adult  Completed   Immunization History  Administered Date(s) Administered  . Influenza Split 12/27/2012  . Influenza, High Dose Seasonal PF 01/31/2014, 11/27/2014, 01/13/2018  . Influenza-Unspecified 01/12/2017  . Pneumococcal Conjugate-13 03/25/2015  . Pneumococcal Polysaccharide-23 03/02/2008  . Td 03/02/2005, 09/24/2015  . Zoster 03/02/2006   Last Colon - 02/12/2017 - Dr Silverio Decamp -  Recc f/u in 3 years - due Dec 2021  Past Surgical History:  Procedure Laterality Date  . COLONOSCOPY  02/17/2005  . CYSTO/  LEFT RETROGRADE PYELOGRAM/ STENT PLACEMENT  01/26/2005  . CYSTOSCOPY W/ URETEROSCOPY W/ LITHOTRIPSY Left 05/04/2005  . CYSTOSCOPY WITH STENT PLACEMENT Left 12/30/2015   Procedure: CYSTOSCOPY WITH STENT PLACEMENT;  Surgeon: Franchot Gallo, MD;  Location[facility];  Service: Urology;  Laterality: Left;  . CYSTOSCOPY/RETROGRADE/URETEROSCOPY/STONE EXTRACTION WITH BASKET Left 12/30/2015   Procedure: CYSTOSCOPY/RETROGRADE/URETEROSCOPY/STONE EXTRACTION WITH BASKET;  Surgeon: Franchot Gallo, MD;  Location: [facility];  Service: Urology;  Laterality: Left;  . CYSTOSCOPY/URETEROSCOPY/HOLMIUM LASER/STENT PLACEMENT Left  03/09/2016   Procedure: CYSTOSCOPY/RETROGRADE PYELOGRAM/URETEROSCOPY/BASKET STONE EXTRACTION/STENT PLACEMENT;  Surgeon: Franchot Gallo, MD;  Location: [facility];  Service: Urology;  Laterality: Left;  . ESOPHAGOGASTRODUODENOSCOPY (EGD) WITH ESOPHAGEAL DILATION  x4  last one 07-21-2010  . HOLMIUM LASER APPLICATION Left 53/29/9242   Procedure: HOLMIUM LASER APPLICATION;  Surgeon: Franchot Gallo, MD;  Location: [facility];  Service: Urology;  Laterality: Left;  . INGUINAL HERNIA REPAIR Left yrs ago  . LUMBAR LAMINECTOMY/DECOMPRESSION MICRODISCECTOMY Left 11/09/2017   Procedure: Left Lumbar Two-Three Microdiscectomy;  Surgeon: Kristeen Miss, MD;  Location: [place];  Service: Neurosurgery;  Laterality: Left;  Left Lumbar Two-Three Microdiscectomy  . NEPHROLITHOTOMY Left 02/01/2014   Procedure: NEPHROLITHOTOMY PERCUTANEOUS;  Surgeon: Jorja Loa, MD;  Location: [facility];  Service: Urology;  Laterality: Left;  . ORIF HIP FRACTURE Left 04/16/2017   Procedure: OPEN REDUCTION INTERNAL FIXATION HIP GREATER TROCHANTER;  Surgeon: Frederik Pear, MD;  Location: [facility];  Service: Orthopedics;  Laterality: Left;  . PARATHYROIDECTOMY Left 07/10/2014   Procedure: LEFT SUPERIOR PARATHYROIDECTOMY;  Surgeon: Armandina Gemma, MD;  Location: [place];  Service: General;  Laterality: Left;  . TOTAL HIP ARTHROPLASTY Left 06/24/2016   Procedure: LEFT TOTAL HIP ARTHROPLASTY ANTERIOR APPROACH;  Surgeon: Gaynelle Arabian, MD;  Location: [facility];  Service: Orthopedics;  Laterality: Left;  . TOTAL HIP REVISION Left 11/18/2016   Procedure: Left femoral revision - posterior approach;  Surgeon: Gaynelle Arabian, MD;  Location: [facility];  Service: Orthopedics;  Laterality: Left;  . tumor ear Left age 73    topical growth behind left ear.   Family History  Problem Relation Age of Onset  . Heart disease Mother   . Alzheimer's disease Mother   . Heart disease Father   . Bladder Cancer Sister    Social History   Occupational  History  . 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 Smoking status: Former Smoker    Years: 8.00    Last attempt to quit: 03/02/1974    Years since quitting: 43.8  . Smokeless tobacco: Never Used  Substance and Sexual Activity  . Alcohol use: Yes    Alcohol/week: 1.0 - 2.0 standard drinks    Types: 1 - 2 Glasses of wine per week    Comment: q afternoon- 11/08/2017- none in 10 days  . Drug use: No  . Sexual activity: Yes    ROS Constitutional: Denies fever, chills, weight loss/gain, headaches, insomnia,  night sweats or change in appetite. Does c/o fatigue. Eyes: Denies redness, blurred vision, diplopia, discharge, itchy or watery eyes.  ENT: Denies discharge, congestion, post nasal drip, epistaxis, sore throat, earache, hearing loss, dental pain, Tinnitus, Vertigo, Sinus pain or snoring.  Cardio: Denies chest pain, palpitations, irregular heartbeat, syncope, dyspnea, diaphoresis, orthopnea, PND, claudication or edema Respiratory: denies cough, dyspnea, DOE, pleurisy, hoarseness, laryngitis or wheezing.  Gastrointestinal: Denies dysphagia, heartburn, reflux, water brash, pain, cramps, nausea, vomiting, bloating, diarrhea, constipation, hematemesis, melena, hematochezia, jaundice or hemorrhoids Genitourinary: Denies dysuria, frequency, urgency, nocturia, hesitancy, discharge, hematuria or flank pain Musculoskeletal: Denies arthralgia, myalgia, stiffness, Jt. Swelling, pain, limp or strain/sprain. Denies Falls. Skin: Denies puritis, rash, hives, warts, acne, eczema or change in skin lesion Neuro: No weakness, tremor, incoordination, spasms, paresthesia or pain Psychiatric: Denies confusion, memory loss or sensory loss. Denies Depression. Endocrine: Denies change in weight, skin, hair change, nocturia, and paresthesia, diabetic polys, visual blurring or hyper / hypo glycemic episodes.  Heme/Lymph: No excessive bleeding, bruising or enlarged lymph nodes.  Physical  Exam  BP 120/86   Pulse 86   Temp 98.5 F (36.9 C)   Resp 16   Ht 5\' 11"  (1.803 m)   Wt 258 lb (117 kg)   SpO2 96%   BMI 35.98 kg/m   General Appearance:  Over nourished and well groomed and in no apparent distress.  Eyes: PERRLA, EOMs, conjunctiva no swelling or erythema, normal fundi and vessels. Sinuses: No frontal/maxillary tenderness ENT/Mouth: EACs patent / TMs  nl. Nares clear without erythema, swelling, mucoid exudates. Oral hygiene is good. No erythema, swelling, or exudate. Tongue normal, non-obstructing. Tonsils not swollen or erythematous. Hearing normal.  Neck: Supple, thyroid not palpable. No bruits, nodes or JVD. Respiratory: Respiratory effort normal.  BS equal and clear bilateral without rales, rhonci, wheezing or stridor. Cardio: Heart sounds are normal with regular rate and rhythm and no murmurs, rubs or gallops. Peripheral pulses are normal and equal  bilaterally without edema. No aortic or femoral bruits. Chest: symmetric with normal excursions and percussion.  Abdomen: Soft, with Nl bowel sounds. Nontender, no guarding, rebound, hernias, masses, or organomegaly.  Lymphatics: Non tender without lymphadenopathy.  Genitourinary: No hernias.Testes nl. DRE - prostate nl for age - smooth & firm w/o nodules. Musculoskeletal: Full ROM all peripheral extremities, joint stability, 5/5 strength, and normal gait. Skin: Warm and dry without rashes, lesions, cyanosis, clubbing or  ecchymosis.  Neuro: Cranial nerves intact, reflexes equal bilaterally. Normal muscle tone, no cerebellar symptoms. Sensation intact.  Pysch: Alert and oriented X 3 with normal affect, insight and judgment appropriate.   Assessment and Plan  1. Annual Preventative/Screening Exam   2. Essential hypertension  - EKG 12-Lead - [place], RETROPERITNL ABD,  LTD - Urinalysis, Routine w reflex microscopic - Microalbumin / creatinine urine ratio - CBC with Differential/Platelet - COMPLETE METABOLIC PANEL  WITH GFR - Magnesium - TSH  3. Hyperlipidemia, mixed  - EKG 12-Lead - [place], RETROPERITNL ABD,  LTD - Lipid panel - TSH  4. Abnormal glucose  - EKG 12-Lead - [place], RETROPERITNL ABD,  LTD - Hemoglobin A1c - Insulin, random  5. Vitamin D deficiency  - VITAMIN D 25 Hydroxyl  6. Prediabetes  - EKG 12-Lead - [place], RETROPERITNL ABD,  LTD - Hemoglobin A1c - Insulin, random  7. Gastroesophageal reflux disease  - CBC with Differential/Platelet  8. Class 2 severe obesity due to excess calories with serious comorbidity and body mass index (BMI) of 36.0 to 36.9 in adult (HCC)  - phentermine (ADIPEX-P) 37.5 MG tablet; Take 1/2 to 1 tablet every morning for dieting & weight  loss  Dispense: 30 tablet; Refill: 5 - topiramate (TOPAMAX) 50 MG tablet; Take 1 tablet 2 x /day at Suppertime & Bedtime  Dispense: 60 tablet; Refill: 5  9. Screening for ischemic heart disease  - EKG 12-Lead  10. FHx: heart disease  - EKG 12-Lead - [place], RETROPERITNL ABD,  LTD  11. Former smoker  - EKG 12-Lead - [place], RETROPERITNL ABD,  LTD  12. Screening for AAA (aortic abdominal aneurysm)  - [place], RETROPERITNL ABD,  LTD  13. Screening for colorectal cancer  - POC Hemoccult Bld/Stl  14. BPH with obstruction/lower urinary tract symptoms  - PSA  15. Prostate cancer screening  - PSA  16. Needs flu shot  - Flu vaccine HIGH DOSE PF  17. Medication management  - Urinalysis, Routine w reflex microscopic - Microalbumin / creatinine urine ratio - CBC with Differential/Platelet - COMPLETE METABOLIC PANEL WITH GFR - Magnesium - Lipid panel - TSH - Hemoglobin A1c - Insulin, random - VITAMIN D 25 Hydroxyl         Patient was counseled in prudent diet, weight control to achieve/maintain BMI less than 25, BP monitoring, regular exercise and medications as discussed.  Discussed med effects and SE's. Routine screening labs and tests as requested with regular follow-up as recommended. Over 40 minutes of  exam, counseling, chart review and high complex critical decision making was performed

## 2018-01-14 ENCOUNTER — Other Ambulatory Visit: Payer: Self-pay | Admitting: Gastroenterology

## 2018-01-14 DIAGNOSIS — R531 Weakness: Secondary | ICD-10-CM | POA: Diagnosis not present

## 2018-01-14 DIAGNOSIS — Z96642 Presence of left artificial hip joint: Secondary | ICD-10-CM | POA: Diagnosis not present

## 2018-01-14 DIAGNOSIS — Z4789 Encounter for other orthopedic aftercare: Secondary | ICD-10-CM | POA: Diagnosis not present

## 2018-01-14 DIAGNOSIS — M5126 Other intervertebral disc displacement, lumbar region: Secondary | ICD-10-CM | POA: Diagnosis not present

## 2018-01-14 LAB — MAGNESIUM: Magnesium: 1.7 mg/dL (ref 1.5–2.5)

## 2018-01-14 LAB — LIPID PANEL
Cholesterol: 184 mg/dL (ref <200)
HDL: 53 mg/dL (ref >40)
LDL Cholesterol (Calc): 97 mg/dL (calc)
Non-HDL Cholesterol (Calc): 131 mg/dL (calc) — ABNORMAL HIGH (ref <130)
Total CHOL/HDL Ratio: 3.5 (calc) (ref <5.0)
Triglycerides: 223 mg/dL — ABNORMAL HIGH (ref <150)

## 2018-01-14 LAB — INSULIN, RANDOM: Insulin: 33.4 u[IU]/mL — ABNORMAL HIGH (ref 2.0–19.6)

## 2018-01-14 LAB — COMPLETE METABOLIC PANEL WITH GFR
AG Ratio: 1.4 (calc) (ref 1.0–2.5)
ALT: 16 U/L (ref 9–46)
AST: 24 U/L (ref 10–35)
Albumin: 4.3 g/dL (ref 3.6–5.1)
Alkaline phosphatase (APISO): 32 U/L — ABNORMAL LOW (ref 40–115)
BUN/Creatinine Ratio: 14 (calc) (ref 6–22)
BUN: 23 mg/dL (ref 7–25)
CO2: 27 mmol/L (ref 20–32)
Calcium: 9.8 mg/dL (ref 8.6–10.3)
Chloride: 103 mmol/L (ref 98–110)
Creat: 1.6 mg/dL — ABNORMAL HIGH (ref 0.70–1.18)
GFR, Est African American: 48 mL/min/{1.73_m2} — ABNORMAL LOW (ref >=60)
GFR, Est Non African American: 42 mL/min/{1.73_m2} — ABNORMAL LOW (ref >=60)
Globulin: 3.1 g/dL (calc) (ref 1.9–3.7)
Glucose, Bld: 130 mg/dL — ABNORMAL HIGH (ref 65–99)
Potassium: 4.4 mmol/L (ref 3.5–5.3)
Sodium: 139 mmol/L (ref 135–146)
Total Bilirubin: 0.5 mg/dL (ref 0.2–1.2)
Total Protein: 7.4 g/dL (ref 6.1–8.1)

## 2018-01-14 LAB — CBC WITH DIFFERENTIAL/PLATELET
Basophils Absolute: 42 cells/uL (ref 0–200)
Basophils Relative: 0.7 %
Eosinophils Absolute: 300 cells/uL (ref 15–500)
Eosinophils Relative: 5 %
HCT: 36.3 % — ABNORMAL LOW (ref 38.5–50.0)
Hemoglobin: 11.7 g/dL — ABNORMAL LOW (ref 13.2–17.1)
Lymphs Abs: 1572 cells/uL (ref 850–3900)
MCH: 26.1 pg — ABNORMAL LOW (ref 27.0–33.0)
MCHC: 32.2 g/dL (ref 32.0–36.0)
MCV: 81 fL (ref 80.0–100.0)
MPV: 10.9 fL (ref 7.5–12.5)
Monocytes Relative: 11.3 %
Neutro Abs: 3408 cells/uL (ref 1500–7800)
Neutrophils Relative %: 56.8 %
Platelets: 404 10*3/uL — ABNORMAL HIGH (ref 140–400)
RBC: 4.48 10*6/uL (ref 4.20–5.80)
RDW: 14.4 % (ref 11.0–15.0)
Total Lymphocyte: 26.2 %
WBC mixed population: 678 cells/uL (ref 200–950)
WBC: 6 10*3/uL (ref 3.8–10.8)

## 2018-01-14 LAB — URINALYSIS, ROUTINE W REFLEX MICROSCOPIC
Bilirubin Urine: NEGATIVE
Glucose, UA: NEGATIVE
Hgb urine dipstick: NEGATIVE
Ketones, ur: NEGATIVE
Leukocytes, UA: NEGATIVE
Nitrite: NEGATIVE
Protein, ur: NEGATIVE
Specific Gravity, Urine: 1.023 (ref 1.001–1.03)
pH: <=5 (ref 5.0–8.0)

## 2018-01-14 LAB — VITAMIN D 25 HYDROXY (VIT D DEFICIENCY, FRACTURES): Vit D, 25-Hydroxy: 57 ng/mL (ref 30–100)

## 2018-01-14 LAB — HEMOGLOBIN A1C
Hgb A1c MFr Bld: 6.6 % of total Hgb — ABNORMAL HIGH (ref <5.7)
Mean Plasma Glucose: 143 (calc)
eAG (mmol/L): 7.9 (calc)

## 2018-01-14 LAB — MICROALBUMIN / CREATININE URINE RATIO
Creatinine, Urine: 164 mg/dL (ref 20–320)
Microalb Creat Ratio: 3 mcg/mg creat (ref <30)
Microalb, Ur: 0.5 mg/dL

## 2018-01-14 LAB — PSA: PSA: 1.3 ng/mL (ref <=4.0)

## 2018-01-14 LAB — TSH: TSH: 2.94 mIU/L (ref 0.40–4.50)

## 2018-01-16 ENCOUNTER — Encounter: Payer: Self-pay | Admitting: Internal Medicine

## 2018-01-24 DIAGNOSIS — M5126 Other intervertebral disc displacement, lumbar region: Secondary | ICD-10-CM | POA: Diagnosis not present

## 2018-01-24 DIAGNOSIS — R531 Weakness: Secondary | ICD-10-CM | POA: Diagnosis not present

## 2018-01-24 DIAGNOSIS — Z4789 Encounter for other orthopedic aftercare: Secondary | ICD-10-CM | POA: Diagnosis not present

## 2018-01-24 DIAGNOSIS — Z96642 Presence of left artificial hip joint: Secondary | ICD-10-CM | POA: Diagnosis not present

## 2018-01-31 NOTE — Progress Notes (Signed)
Subjective:    Patient ID: [name] [name], male    DOB: [birth year], 77 y.o.   MRN: [id number]  HPI 77 y.o. obese WM presents with URI x 11/17 after being at B&B with wife. Keeps having cough, white mucus, some SOB with prolonged coughing, no fever, chills, CP.   He has had trouble swallowing x years, last EGD 2012. He states that his swallowing has gotten worse, worse with food, he states that he has had EGD 3 x in the past. He has also had right drop foot that got better, saw ortho/neurosurgery and he states it was not his back. He has increase his PPI to twice a day that has not helped. He has had changes in vision in past months, no double vision. No HA, changes in speech.   BMI is Body mass index is 36.04 kg/m., he is working on diet and exercise. Wt Readings from Last 3 Encounters:  02/01/18 258 lb 6.4 oz (117.2 kg)  01/13/18 258 lb (117 kg)  11/09/17 250 lb (113.4 kg)   CXR 04/2017  Blood pressure 126/74, pulse 62, temperature 98.2 F (36.8 C), height 5\' 11"  (1.803 m), weight 258 lb 6.4 oz (117.2 kg), SpO2 98 %.  Medications Current Outpatient Medications on File Prior to Visit  Medication Sig  . aspirin EC 81 MG tablet Take 81 mg by mouth daily.  Marland Kitchen b complex vitamins tablet Take 1 tablet by mouth daily.  . bisoprolol-hydrochlorothiazide (ZIAC) 5-6.25 MG tablet TAKE 1 TABLET BY MOUTH EVERY MORNING FOR BLOOD PRESSURE (Patient taking differently: Take 1 tablet by mouth daily. )  . Cholecalciferol (VITAMIN D PO) Take 1 tablet by mouth 2 (two) times daily.   . fenofibrate (TRICOR) 145 MG tablet TAKE 1 TABLET BY MOUTH DAILY FOR TRIGLYCERIDES/BLOOD FATS (Patient taking differently: Take 145 mg by mouth daily. )  . finasteride (PROSCAR) 5 MG tablet Take 5 mg by mouth daily.  . Flaxseed, Linseed, (FLAX SEEDS PO) Take 1,000 mg by mouth daily.   Marland Kitchen losartan (COZAAR) 100 MG tablet TAKE 1 TABLET BY MOUTH DAILY FOR BLOOD PRESSURE.  . magnesium oxide (MAG-OX) 400 MG tablet Take 400 mg by  mouth daily.   . meloxicam (MOBIC) 15 MG tablet Take 1/2 to 1 tablet daily with food for pain & Inflammation (Patient taking differently: Take 15 mg by mouth daily. Take 1/2 to 1 tablet daily with food for pain & Inflammation)  . methocarbamol (ROBAXIN) 500 MG tablet Take 1 tablet (500 mg total) by mouth every 6 (six) hours as needed for muscle spasms.  . Omega-3 Fatty Acids (FISH OIL) 1200 MG CAPS Take 1,200 mg by mouth daily.   Marland Kitchen OVER THE COUNTER MEDICATION   . pantoprazole (PROTONIX) 40 MG tablet TAKE 1 TABLET (40 MG TOTAL) BY MOUTH DAILY.  Marland Kitchen phentermine (ADIPEX-P) 37.5 MG tablet Take 1/2 to 1 tablet every morning for dieting & weight  loss  . tamsulosin (FLOMAX) 0.4 MG CAPS capsule Take 0.4 mg by mouth daily after breakfast.   . topiramate (TOPAMAX) 50 MG tablet Take 1 tablet 2 x /day at Suppertime & Bedtime  . vitamin B-12 (CYANOCOBALAMIN) 1000 MCG tablet Take 1 tablet (1,000 mcg total) by mouth daily.   No current facility-administered medications on file prior to visit.     Problem list He has Stricture and stenosis of esophagus; Hyperlipidemia; Hypertension; GERD (gastroesophageal reflux disease); Kidney stones; Morbidly obese ([place]); Vitamin D deficiency; Prediabetes; Medication management; Hyperparathyroidism, primary ([place]); Encounter for Medicare annual  wellness exam; CKD (chronic kidney disease) stage 3, GFR 30-59 ml/min ([place]); Snoring; OA (osteoarthritis) of hip; Normocytic anemia; Vitamin B12 deficiency; and Herniated nucleus pulposus, L2-3 left on their problem list.   Review of Systems See HPI    Objective:   Physical Exam General appearance: alert, no distress, WD/WN, male HEENT: normocephalic, sclerae anicteric, TMs pearly, nares patent, no discharge or erythema, pharynx normal Oral cavity: MMM, no lesions, crowded mouth Neck: supple, large neck circumference, no lymphadenopathy, no thyromegaly, no masses Heart: RRR, normal S1, S2, no murmurs Lungs: CTA bilaterally, +  wheezes, no rhonchi, or rales Abdomen: +bs, soft, obese, non tender, non distended, no masses, no hepatomegaly, no splenomegaly Musculoskeletal: nontender, no swelling, no obvious deformity Extremities: no edema, no cyanosis, no clubbing Pulses: 2+ symmetric, upper and lower extremities, normal cap refill Neurological: alert, oriented x 3, CN2-12 intact, strength normal upper extremities and lower extremities, sensation normal throughout, DTRs 2+ throughout, no cerebellar signs, gait antalgic Psychiatric: normal affect, behavior normal, pleasant        Assessment & Plan:  Elva was seen today for acute visit and cough.  Diagnoses and all orders for this visit:  Bronchitis -     azithromycin (ZITHROMAX) 250 MG tablet; Take 2 tablets (500 mg) on  Day 1,  followed by 1 tablet (250 mg) once daily on Days 2 through 5. -     promethazine-dextromethorphan (PROMETHAZINE-DM) 6.25-15 MG/5ML syrup; Take 5 mLs by mouth 4 (four) times daily as needed for cough.  Sleep apnea, unspecified type Willing to get sleep study in new year  Dysphagia Will call for follow up with GI and likely needs EGD Continue PPI

## 2018-02-01 ENCOUNTER — Ambulatory Visit (INDEPENDENT_AMBULATORY_CARE_PROVIDER_SITE_OTHER): Payer: Medicare Other | Admitting: Physician Assistant

## 2018-02-01 ENCOUNTER — Encounter: Payer: Self-pay | Admitting: Physician Assistant

## 2018-02-01 VITALS — BP 126/74 | HR 62 | Temp 98.2°F | Ht 71.0 in | Wt 258.4 lb

## 2018-02-01 DIAGNOSIS — G4730 Sleep apnea, unspecified: Secondary | ICD-10-CM

## 2018-02-01 DIAGNOSIS — R1310 Dysphagia, unspecified: Secondary | ICD-10-CM | POA: Diagnosis not present

## 2018-02-01 DIAGNOSIS — J40 Bronchitis, not specified as acute or chronic: Secondary | ICD-10-CM

## 2018-02-01 MED ORDER — AZITHROMYCIN 250 MG PO TABS: ORAL_TABLET | ORAL | 1 refills | Status: AC

## 2018-02-01 MED ORDER — PROMETHAZINE-DM 6.25-15 MG/5ML PO SYRP: 5.0000 mL | ORAL_SOLUTION | Freq: Four times a day (QID) | ORAL | 1 refills | Status: DC | PRN

## 2018-02-01 NOTE — Patient Instructions (Signed)
HOW TO TREAT VIRAL COUGH AND COLD SYMPTOMS:  -Symptoms usually last at least 1 week with the worst symptoms being around day 4.  - colds usually start with a sore throat and end with a cough, and the cough can take 2 weeks to get better.  -No antibiotics are needed for colds, flu, sore throats, cough, bronchitis UNLESS symptoms are longer than 7 days OR if you are getting better then get drastically worse.  -There are a lot of combination medications (Dayquil, Nyquil, Vicks 44, tyelnol cold and sinus, ETC). Please look at the ingredients on the back so that you are treating the correct symptoms and not doubling up on medications/ingredients.    Medicines you can use  Nasal congestion  Little Remedies saline spray (aerosol/mist)- can try this, it is in the kids section - pseudoephedrine (Sudafed)- behind the counter, do not use if you have high blood pressure, medicine that have -D in them.  - phenylephrine (Sudafed PE) -Dextormethorphan + chlorpheniramine (Coridcidin HBP)- okay if you have high blood pressure -Oxymetazoline (Afrin) nasal spray- LIMIT to 3 days -Saline nasal spray -Neti pot (used distilled or bottled water)  Ear pain/congestion  -pseudoephedrine (sudafed) - Nasonex/flonase nasal spray  Fever  -Acetaminophen (Tyelnol) -Ibuprofen (Advil, motrin, aleve)  Sore Throat  -Acetaminophen (Tyelnol) -Ibuprofen (Advil, motrin, aleve) -Drink a lot of water -Gargle with salt water - Rest your voice (don't talk) -Throat sprays -Cough drops  Body Aches  -Acetaminophen (Tyelnol) -Ibuprofen (Advil, motrin, aleve)  Headache  -Acetaminophen (Tyelnol) -Ibuprofen (Advil, motrin, aleve) - Exedrin, Exedrin Migraine  Allergy symptoms (cough, sneeze, runny nose, itchy eyes) -Claritin or loratadine cheapest but likely the weakest  -Zyrtec or certizine at night because it can make you sleepy -The strongest is allegra or fexafinadine  Cheapest at [employer], [employer],  [employer]  Cough  -Dextromethorphan (Delsym)- medicine that has DM in it -Guafenesin (Mucinex/Robitussin) - cough drops - drink lots of water  Chest Congestion  -Guafenesin (Mucinex/Robitussin)  Red Itchy Eyes  - Naphcon-A  Upset Stomach  - Bland diet (nothing spicy, greasy, fried, and high acid foods like tomatoes, oranges, berries) -OKAY- cereal, bread, soup, crackers, rice -Eat smaller more frequent meals -reduce caffeine, no alcohol -Loperamide (Imodium-AD) if diarrhea -Prevacid for heart burn  General health when sick  -Hydration -wash your hands frequently -keep surfaces clean -change pillow cases and sheets often -Get fresh air but do not exercise strenuously -Vitamin D, double up on it - Vitamin C -Zinc       I think it is possible that you have sleep apnea. It can cause interrupted sleep, headaches, frequent awakenings, fatigue, dry mouth, fast/slow heart beats, memory issues, anxiety/depression, swelling, numbness tingling hands/feet, weight gain, shortness of breath, and the list goes on. Sleep apnea needs to be ruled out because if it is left untreated it does eventually lead to abnormal heart beats, lung failure or heart failure as well as increasing the risk of heart attack and stroke. There are masks you can wear OR a mouth piece that I can give you information about. Often times though people feel MUCH better after getting treatment.   Sleep Apnea  Sleep apnea is a sleep disorder characterized by abnormal pauses in breathing while you sleep. When your breathing pauses, the level of oxygen in your blood decreases. This causes you to move out of deep sleep and into light sleep. As a result, your quality of sleep is poor, and the system that carries your blood throughout your body (cardiovascular  system) experiences stress. If sleep apnea remains untreated, the following conditions can develop:  High blood pressure (hypertension).  Coronary artery disease.   Inability to achieve or maintain an erection (impotence).  Impairment of your thought process (cognitive dysfunction). There are three types of sleep apnea: 1. Obstructive sleep apnea--Pauses in breathing during sleep because of a blocked airway. 2. Central sleep apnea--Pauses in breathing during sleep because the area of the brain that controls your breathing does not send the correct signals to the muscles that control breathing. 3. Mixed sleep apnea--A combination of both obstructive and central sleep apnea.  RISK FACTORS The following risk factors can increase your risk of developing sleep apnea:  Being overweight.  Smoking.  Having narrow passages in your nose and throat.  Being of older age.  Being male.  Alcohol use.  Sedative and tranquilizer use.  Ethnicity. Among individuals younger than 35 years, African Americans are at increased risk of sleep apnea. SYMPTOMS   Difficulty staying asleep.  Daytime sleepiness and fatigue.  Loss of energy.  Irritability.  Loud, heavy snoring.  Morning headaches.  Trouble concentrating.  Forgetfulness.  Decreased interest in sex. DIAGNOSIS  In order to diagnose sleep apnea, your caregiver will perform a physical examination. Your caregiver may suggest that you take a home sleep test. Your caregiver may also recommend that you spend the night in a sleep lab. In the sleep lab, several monitors record information about your heart, lungs, and brain while you sleep. Your leg and arm movements and blood oxygen level are also recorded. TREATMENT The following actions may help to resolve mild sleep apnea:  Sleeping on your side.   Using a decongestant if you have nasal congestion.   Avoiding the use of depressants, including alcohol, sedatives, and narcotics.   Losing weight and modifying your diet if you are overweight. There also are devices and treatments to help open your airway:  Oral appliances. These are custom-made  mouthpieces that shift your lower jaw forward and slightly open your bite. This opens your airway.  Devices that create positive airway pressure. This positive pressure "splints" your airway open to help you breathe better during sleep. The following devices create positive airway pressure:  Continuous positive airway pressure (CPAP) device. The CPAP device creates a continuous level of air pressure with an air pump. The air is delivered to your airway through a mask while you sleep. This continuous pressure keeps your airway open.  Nasal expiratory positive airway pressure (EPAP) device. The EPAP device creates positive air pressure as you exhale. The device consists of single-use valves, which are inserted into each nostril and held in place by adhesive. The valves create very little resistance when you inhale but create much more resistance when you exhale. That increased resistance creates the positive airway pressure. This positive pressure while you exhale keeps your airway open, making it easier to breath when you inhale again.  Bilevel positive airway pressure (BPAP) device. The BPAP device is used mainly in patients with central sleep apnea. This device is similar to the CPAP device because it also uses an air pump to deliver continuous air pressure through a mask. However, with the BPAP machine, the pressure is set at two different levels. The pressure when you exhale is lower than the pressure when you inhale.  Surgery. Typically, surgery is only done if you cannot comply with less invasive treatments or if the less invasive treatments do not improve your condition. Surgery involves removing excess tissue in  your airway to create a wider passage way. Document Released: 02/06/2002 Document Revised: 06/13/2012 Document Reviewed: 06/25/2011 [facility] Patient Information 2015 [place], [place]. This information is not intended to replace advice given to you by your health care provider. Make sure you  discuss any questions you have with your health care provider.    Phentermine  While taking the medication we may ask that you come into the office once a month for the first month for a blood pressure check and EKG.   Also please bring in a food log for that visit to review. It is helpful if you bring in a food diary or use an app on your phone such as myfitnesspal to record your calorie intake, especially in the beginning. BRING FOR YOUR FIRST VISIT.  After that first initial visit, we will want to see you once every 2-3 months to monitor your weight, blood pressure, and heart rate.   In addition we can help answer your questions about diet, exercise, and help you every step of the way with your weight loss journey.  You can start out on 1/3 to 1/2 a pill in the morning and if you are tolerating it well you can increase to one pill daily. I also have some patients that take 1/3 or 1/2 at lunch to help prevent night time eating.  This medication is cheapest CASH pay at [facility] is 14-17 dollars and you do NOT need a membership to get meds from there.   It causes dry mouth and constipation in almost every patient, so try to get 80-100 oz of water a day and increase fiber such as veggies. You can add on a stool softener if you would like.   It can give you energy however it can also cause some people to be shaky, anxious or have palpitations. Stop this medication if that happens and contact the office.   If this medication does not work for you there are several medications that we can try to help rewire your brain in addition to making healthier habits.   What is this medicine? PHENTERMINE (FEN ter meen) decreases your appetite. This medicine is intended to be used in addition to a healthy reduced calorie diet and exercise. The best results are achieved this way. This medicine is only indicated for short-term use. Eventually your weight loss may level out and the  medication will no longer be needed.   How should I use this medicine? Take this medicine by mouth. Follow the directions on the prescription label. The tablets should stay in the bottle until immediately before you take your dose. Take your doses at regular intervals. Do not take your medicine more often than directed.  Overdosage: If you think you have taken too much of this medicine contact a poison control center or emergency room at once. NOTE: This medicine is only for you. Do not share this medicine with others.  What if I miss a dose? If you miss a dose, take it as soon as you can. If it is almost time for your next dose, take only that dose. Do not take double or extra doses. Do not increase or in any way change your dose without consulting your doctor.  What should I watch for while using this medicine? Notify your physician immediately if you become short of breath while doing your normal activities. Do not take this medicine within 6 hours of bedtime. It can keep you from getting to  sleep. Avoid drinks that contain caffeine and try to stick to a regular bedtime every night. Do not stand or sit up quickly, especially if you are an older patient. This reduces the risk of dizzy or fainting spells. Avoid alcoholic drinks.  What side effects may I notice from receiving this medicine? Side effects that you should report to your doctor or health care professional as soon as possible: -chest pain, palpitations -depression or severe changes in mood -increased blood pressure -irritability -nervousness or restlessness -severe dizziness -shortness of breath -problems urinating -unusual swelling of the legs -vomiting  Side effects that usually do not require medical attention (report to your doctor or health care professional if they continue or are bothersome): -blurred vision or other eye problems -changes in sexual ability or desire -constipation or diarrhea -difficulty  sleeping -dry mouth or unpleasant taste -headache -nausea This list may not describe all possible side effects. Call your doctor for medical advice about side effects. You may report side effects to FDA at 1-800-FDA-1088.

## 2018-02-08 DIAGNOSIS — M25552 Pain in left hip: Secondary | ICD-10-CM | POA: Diagnosis not present

## 2018-02-10 DIAGNOSIS — H25813 Combined forms of age-related cataract, bilateral: Secondary | ICD-10-CM | POA: Diagnosis not present

## 2018-02-10 DIAGNOSIS — D3132 Benign neoplasm of left choroid: Secondary | ICD-10-CM | POA: Diagnosis not present

## 2018-02-10 DIAGNOSIS — E119 Type 2 diabetes mellitus without complications: Secondary | ICD-10-CM | POA: Diagnosis not present

## 2018-02-10 LAB — HM DIABETES EYE EXAM

## 2018-02-14 NOTE — Progress Notes (Signed)
MEDICARE ANNUAL WELLNESS VISIT AND FOLLOW UP Assessment:    Medicare annual wellness visit, subsequent   Essential hypertension - continue medications, DASH diet, exercise and monitor at home. Call if greater than 130/80.  - CBC with Differential/Platelet - CMP/GFR - TSH   Morbid obesity, unspecified obesity type ([place]) Long discussion about weight loss, diet, and exercise Discussed final goal weight  Patient didn't tolerate topamax, willing to retry phentermine; continue close follow up. Return in 3 months HAS NOT HAD SLEEP STUDY YET BUT VERY LIKELY HAS OSA GET SLEEP STUDY - declines at this time, has been too busy with hip surgery  Hyperparathyroidism, primary ([place]) - CMP/GFR  Prediabetes/borderline diabetic Working aggressively on weight loss  Discussed disease and risks Discussed diet/exercise, weight management  - Hemoglobin A1c  Hyperlipidemia -continue medications, check lipids, decrease fatty foods, increase activity.  - Lipid panel  Medication management - Magnesium  Vitamin D deficiency - VITAMIN D 25 Hydroxy (Vit-D Deficiency, Fractures)   Kidney stones Lifestyle discussed, push fluids  CMP/GFR   Stricture and stenosis of esophagus Continue PPI  Gastroesophageal reflux disease, esophagitis presence not specified Continue PPI  CKD (chronic kidney disease) stage 3, GFR 30-59 ml/min Increase fluids, avoid NSAIDS, monitor sugars, will monitor - CMP/GFR  Hip osteoarthritis S/p L hip arthroplasty, follow up ortho PRN, continue PT and exercises   DEFER ALL LABS TODAY AS JUST HAD IN NOVEMBER  Over 30 minutes of exam, counseling, chart review, and critical decision making was performed  Future Appointments  Date Time Provider [place]  04/20/2018 11:15 AM Liane Comber, NP [facility] None  08/03/2018 10:30 AM Unk Pinto, MD [facility] None  02/06/2019  2:00 PM Unk Pinto, MD [facility] None     Plan:   During the course of the  visit the patient was educated and counseled about appropriate screening and preventive services including:    Pneumococcal vaccine   Influenza vaccine  Prevnar 13  Td vaccine  Screening electrocardiogram  Colorectal cancer screening  Diabetes screening  Glaucoma screening  Nutrition counseling    Subjective:  [name] [name] is a 77 y.o. male who presents for Medicare Annual Wellness Visit.  He has had multiple surgeries on L hip for failed arthroplasty in the past 1-2 years, now working with PT twice a week but still with antalgic gait and walking with cane. Ortho unfortunately suspects this will be permanent. He reports pain is minimal and manageable but cannot walk miles daily as he used to.   he is prescribed phentermine/topamax for weight loss at last visit. He report he experienced severe sedation and stopped both medications after 2 days. While on the medication they have lost 3 lbs since last visit. They deny palpitations, anxiety, trouble sleeping, elevated BP.   BMI is Body mass index is 35.57 kg/m., he is working on diet and exercise. He reports quantity is very limited, but admits to high sugar/starch intake - 1/2 bagel with jam, glass of juice, etc.  Wt Readings from Last 3 Encounters:  02/15/18 255 lb (115.7 kg)  02/01/18 258 lb 6.4 oz (117.2 kg)  01/13/18 258 lb (117 kg)   His blood pressure has been controlled at home, today their BP is BP: 120/80 He does not workout. He denies chest pain, shortness of breath, dizziness.   Has chronic GERD, on PPI, has had esophagus stretched 3 times.  Had cystoscopy for stone/stent placement in Oct 2018 by Dr. Diona Fanti  He is on cholesterol medication and has some myalgias  with fenofibrate. His cholesterol is at goal. The cholesterol last visit was:   Lab Results  Component Value Date   CHOL 184 01/13/2018   HDL 53 01/13/2018   LDLCALC 97 01/13/2018   TRIG 223 (H) 01/13/2018   CHOLHDL 3.5 01/13/2018   He has been  working on diet and exercise for prediabetes/T2DM, and denies paresthesia of the feet, polydipsia, polyuria and visual disturbances. Last A1C in the office was:  Lab Results  Component Value Date   HGBA1C 6.6 (H) 01/13/2018   Last GFR  Lab Results  Component Value Date   GFRNONAA 42 (L) 01/13/2018   Patient is on Vitamin D supplement.   Lab Results  Component Value Date   VD25OH 57 01/13/2018        Medication Review: Current Outpatient Medications on File Prior to Visit  Medication Sig Dispense Refill  . aspirin EC 81 MG tablet Take 81 mg by mouth daily.    Marland Kitchen b complex vitamins tablet Take 1 tablet by mouth daily.    . bisoprolol-hydrochlorothiazide (ZIAC) 5-6.25 MG tablet TAKE 1 TABLET BY MOUTH EVERY MORNING FOR BLOOD PRESSURE (Patient taking differently: Take 1 tablet by mouth daily. ) 90 tablet 4  . Cholecalciferol (VITAMIN D PO) Take 1 tablet by mouth 2 (two) times daily.     . fenofibrate (TRICOR) 145 MG tablet TAKE 1 TABLET BY MOUTH DAILY FOR TRIGLYCERIDES/BLOOD FATS (Patient taking differently: Take 145 mg by mouth daily. ) 90 tablet 3  . finasteride (PROSCAR) 5 MG tablet Take 5 mg by mouth daily.    . Flaxseed, Linseed, (FLAX SEEDS PO) Take 1,000 mg by mouth daily.     Marland Kitchen losartan (COZAAR) 100 MG tablet TAKE 1 TABLET BY MOUTH DAILY FOR BLOOD PRESSURE. 90 tablet 3  . magnesium oxide (MAG-OX) 400 MG tablet Take 400 mg by mouth daily.     . meloxicam (MOBIC) 15 MG tablet Take 1/2 to 1 tablet daily with food for pain & Inflammation (Patient taking differently: Take 15 mg by mouth daily. Take 1/2 to 1 tablet daily with food for pain & Inflammation) 30 tablet 0  . methocarbamol (ROBAXIN) 500 MG tablet Take 1 tablet (500 mg total) by mouth every 6 (six) hours as needed for muscle spasms. 40 tablet 3  . Omega-3 Fatty Acids (FISH OIL) 1200 MG CAPS Take 1,200 mg by mouth daily.     Marland Kitchen OVER THE COUNTER MEDICATION     . pantoprazole (PROTONIX) 40 MG tablet TAKE 1 TABLET (40 MG TOTAL) BY  MOUTH DAILY. 90 tablet 0  . promethazine-dextromethorphan (PROMETHAZINE-DM) 6.25-15 MG/5ML syrup Take 5 mLs by mouth 4 (four) times daily as needed for cough. 240 mL 1  . tamsulosin (FLOMAX) 0.4 MG CAPS capsule Take 0.4 mg by mouth daily after breakfast.   3  . vitamin B-12 (CYANOCOBALAMIN) 1000 MCG tablet Take 1 tablet (1,000 mcg total) by mouth daily. 30 tablet 0  . phentermine (ADIPEX-P) 37.5 MG tablet Take 1/2 to 1 tablet every morning for dieting & weight  loss (Patient not taking: Reported on 02/15/2018) 30 tablet 5  . topiramate (TOPAMAX) 50 MG tablet Take 1 tablet 2 x /day at Suppertime & Bedtime (Patient not taking: Reported on 02/15/2018) 60 tablet 5   No current facility-administered medications on file prior to visit.     Current Problems (verified) Patient Active Problem List   Diagnosis Date Noted  . Herniated nucleus pulposus, L2-3 left 11/09/2017  . Normocytic anemia 05/02/2017  . Vitamin  B12 deficiency 05/02/2017  . OA (osteoarthritis) of hip 06/24/2016  . CKD (chronic kidney disease) stage 3, GFR 30-59 ml/min (H[facility]) 03/25/2015  . Snoring 03/25/2015  . Encounter for Medicare annual wellness exam 11/27/2014  . Hyperparathyroidism, primary ([place]) 07/09/2014  . Vitamin D deficiency 06/13/2013  . Prediabetes 06/13/2013  . Medication management 06/13/2013  . Hyperlipidemia   . Hypertension   . GERD (gastroesophageal reflux disease)   . Kidney stones   . Morbidly obese ([place])   . Stricture and stenosis of esophagus 10/18/2008    Screening Tests Immunization History  Administered Date(s) Administered  . Influenza Split 12/27/2012  . Influenza, High Dose Seasonal PF 01/31/2014, 11/27/2014, 01/13/2018  . Influenza-Unspecified 01/12/2017  . Pneumococcal Conjugate-13 03/25/2015  . Pneumococcal Polysaccharide-23 03/02/2008  . Td 03/02/2005, 09/24/2015  . Zoster 03/02/2006   Preventative care: Last colonoscopy: 01/2017 EGD 2012 MGM 2013  Prior vaccinations: TD or  Tdap: 2017 Influenza: 2019  Pneumococcal: 2010 Prevnar13: 2017 Shingles/Zostavax: 2008  Names of Other Physician/Practitioners you currently use: 1. [facility] here for primary care 2. Dr. Katy Fitch, eye doctor, last visit 2019, has upcoming cataract  3. Dr. Altamese [place], dentist, last visit 2019  Patient Care Team: Unk Pinto, MD as PCP - General (Internal Medicine) Newt Minion, MD as Consulting Physician (Orthopedic Surgery) Inda Castle, MD (Inactive) as Consulting Physician (Gastroenterology) Franchot Gallo, MD as Consulting Physician (Urology)  Allergies Allergies  Allergen Reactions  . Iodinated Diagnostic Agents Shortness Of Breath  . Iohexol Shortness Of Breath    SURGICAL HISTORY He  has a past surgical history that includes tumor ear (Left, age 74); Cystoscopy w/ ureteroscopy w/ lithotripsy (Left, 05/04/2005); Colonoscopy (02/17/2005); Nephrolithotomy (Left, 02/01/2014); Parathyroidectomy (Left, 07/10/2014); CYSTO/  LEFT RETROGRADE PYELOGRAM/ STENT PLACEMENT (01/26/2005); Esophagogastroduodenoscopy (egd) with esophageal dilation (x4  last one 07-21-2010); Inguinal hernia repair (Left, yrs ago); Cystoscopy/retrograde/ureteroscopy/stone extraction with basket (Left, 12/30/2015); Cystoscopy with stent placement (Left, 12/30/2015); Holmium laser application (Left, 42/87/6811); Cystoscopy/ureteroscopy/holmium laser/stent placement (Left, 03/09/2016); Total hip arthroplasty (Left, 06/24/2016); Total hip revision (Left, 11/18/2016); ORIF hip fracture (Left, 04/16/2017); and Lumbar laminectomy/decompression microdiscectomy (Left, 11/09/2017). FAMILY HISTORY His family history includes Alzheimer's disease in his mother; Bladder Cancer in his sister; Heart disease in his father and mother. SOCIAL HISTORY He  reports that he quit smoking about 43 years ago. He quit after 8.00 years of use. He has never used smokeless tobacco. He reports current alcohol  use of about 1.0 - 2.0 standard drinks of alcohol per week. He reports that he does not use drugs.  MEDICARE WELLNESS OBJECTIVES: Physical activity:   Cardiac risk factors:   Depression/mood screen:   Depression screen [facility] 2/9 02/15/2018  Decreased Interest 0  Down, Depressed, Hopeless 0  PHQ - 2 Score 0    ADLs:  In your present state of health, do you have any difficulty performing the following activities: 01/16/2018 11/09/2017  Hearing? N N  Vision? N N  Difficulty concentrating or making decisions? N N  Walking or climbing stairs? N Y  Dressing or bathing? N Y  Doing errands, shopping? N Y  Some recent data might be hidden     Cognitive Testing  Alert? Yes  Normal Appearance?Yes  Oriented to person? Yes  Place? Yes   Time? Yes  Recall of three objects?  Yes  Can perform simple calculations? Yes  Displays appropriate judgment?Yes  Can read the correct time from a watch face?Yes  EOL planning: Does Patient Have a Medical Advance  Directive?: Yes Type of Advance Directive: Healthcare Power of Attorney, Living will Does patient want to make changes to medical advance directive?: No - Patient declined Copy of [place] in Chart?: Yes - validated most recent copy scanned in chart (See row information)   Objective:   Today's Vitals   02/15/18 1458  BP: 120/80  Pulse: 64  Temp: 97.7 F (36.5 C)  SpO2: 97%  Weight: 255 lb (115.7 kg)  Height: 5\' 11"  (1.803 m)  PainSc: 3   PainLoc: Hip   Body mass index is 35.57 kg/m.  General appearance: alert, no distress, WD/WN, male HEENT: normocephalic, sclerae anicteric, TMs pearly, nares patent, no discharge or erythema, pharynx normal Oral cavity: MMM, no lesions, crowded mouth Neck: supple, large neck circumference, no lymphadenopathy, no thyromegaly, no masses Heart: RRR, normal S1, S2, no murmurs Lungs: CTA bilaterally, no wheezes, rhonchi, or rales Abdomen: +bs, soft, obese, non tender, non distended,  no masses, no hepatomegaly, no splenomegaly Musculoskeletal: nontender, no swelling, no obvious deformity, antalgic gait with cane Extremities: no edema, no cyanosis, no clubbing Pulses: 2+ symmetric, upper and lower extremities, normal cap refill Neurological: alert, oriented x 3, CN2-12 intact, strength normal upper extremities, weakness in bilateral lower extremities, sensation diminished in L thigh, no cerebellar signs, gait antalgic Psychiatric: normal affect, behavior normal, pleasant   Medicare Attestation I have personally reviewed: The patient's medical and social history Their use of alcohol, tobacco or illicit drugs Their current medications and supplements The patient's functional ability including ADLs,fall risks, home safety risks, cognitive, and hearing and visual impairment Diet and physical activities Evidence for depression or mood disorders  The patient's weight, height, BMI, and visual acuity have been recorded in the chart.  I have made referrals, counseling, and provided education to the patient based on review of the above and I have provided the patient with a written personalized care plan for preventive services.     Izora Ribas, NP   02/15/2018

## 2018-02-15 ENCOUNTER — Encounter: Payer: Self-pay | Admitting: Adult Health

## 2018-02-15 ENCOUNTER — Ambulatory Visit (INDEPENDENT_AMBULATORY_CARE_PROVIDER_SITE_OTHER): Payer: Medicare Other | Admitting: Adult Health

## 2018-02-15 VITALS — BP 120/80 | HR 64 | Temp 97.7°F | Ht 71.0 in | Wt 255.0 lb

## 2018-02-15 DIAGNOSIS — R0683 Snoring: Secondary | ICD-10-CM

## 2018-02-15 DIAGNOSIS — K222 Esophageal obstruction: Secondary | ICD-10-CM

## 2018-02-15 DIAGNOSIS — Z0001 Encounter for general adult medical examination with abnormal findings: Secondary | ICD-10-CM | POA: Diagnosis not present

## 2018-02-15 DIAGNOSIS — E782 Mixed hyperlipidemia: Secondary | ICD-10-CM

## 2018-02-15 DIAGNOSIS — M169 Osteoarthritis of hip, unspecified: Secondary | ICD-10-CM | POA: Diagnosis not present

## 2018-02-15 DIAGNOSIS — Z0000 Encounter for general adult medical examination without abnormal findings: Secondary | ICD-10-CM

## 2018-02-15 DIAGNOSIS — M5126 Other intervertebral disc displacement, lumbar region: Secondary | ICD-10-CM

## 2018-02-15 DIAGNOSIS — D649 Anemia, unspecified: Secondary | ICD-10-CM

## 2018-02-15 DIAGNOSIS — R6889 Other general symptoms and signs: Secondary | ICD-10-CM | POA: Diagnosis not present

## 2018-02-15 DIAGNOSIS — N183 Chronic kidney disease, stage 3 unspecified: Secondary | ICD-10-CM

## 2018-02-15 DIAGNOSIS — K219 Gastro-esophageal reflux disease without esophagitis: Secondary | ICD-10-CM | POA: Diagnosis not present

## 2018-02-15 DIAGNOSIS — N200 Calculus of kidney: Secondary | ICD-10-CM

## 2018-02-15 DIAGNOSIS — E538 Deficiency of other specified B group vitamins: Secondary | ICD-10-CM

## 2018-02-15 DIAGNOSIS — I10 Essential (primary) hypertension: Secondary | ICD-10-CM

## 2018-02-15 DIAGNOSIS — E210 Primary hyperparathyroidism: Secondary | ICD-10-CM | POA: Diagnosis not present

## 2018-02-15 DIAGNOSIS — E559 Vitamin D deficiency, unspecified: Secondary | ICD-10-CM

## 2018-02-15 DIAGNOSIS — R7303 Prediabetes: Secondary | ICD-10-CM

## 2018-02-15 DIAGNOSIS — Z79899 Other long term (current) drug therapy: Secondary | ICD-10-CM

## 2018-02-15 NOTE — Patient Instructions (Addendum)
[name] , Thank you for taking time to come for your Medicare Wellness Visit. I appreciate your ongoing commitment to your health goals. Please review the following plan we discussed and let me know if I can assist you in the future.   These are the goals we discussed: Goals    . HEMOGLOBIN A1C < 6.0    . Weight (lb) < 240 lb (108.9 kg)       This is a list of the screening recommended for you and due dates:  Health Maintenance  Topic Date Due  . Colon Cancer Screening  02/13/2020  . Tetanus Vaccine  09/23/2025  . Flu Shot  Completed  . Pneumonia vaccines  Completed     Prediabetes Eating Plan Prediabetes-also called impaired glucose tolerance or impaired fasting glucose-is a condition that causes blood sugar (blood glucose) levels to be higher than normal. Following a healthy diet can help to keep prediabetes under control. It can also help to lower the risk of type 2 diabetes and heart disease, which are increased in people who have prediabetes. Along with regular exercise, a healthy diet:  Promotes weight loss.  Helps to control blood sugar levels.  Helps to improve the way that the body uses insulin.  What do I need to know about this eating plan?  Use the glycemic index (GI) to plan your meals. The index tells you how quickly a food will raise your blood sugar. Choose low-GI foods. These foods take a longer time to raise blood sugar.  Pay close attention to the amount of carbohydrates in the food that you eat. Carbohydrates increase blood sugar levels.  Keep track of how many calories you take in. Eating the right amount of calories will help you to achieve a healthy weight. Losing about 7 percent of your starting weight can help to prevent type 2 diabetes.  You may want to follow a Mediterranean diet. This diet includes a lot of vegetables, lean meats or fish, whole grains, fruits, and healthy oils and fats. What foods can I eat? Grains Whole grains, such as  whole-wheat or whole-grain breads, crackers, cereals, and pasta. Unsweetened oatmeal. Bulgur. Barley. Quinoa. Brown rice. Corn or whole-wheat flour tortillas or taco shells. Vegetables Lettuce. Spinach. Peas. Beets. Cauliflower. Cabbage. Broccoli. Carrots. Tomatoes. Squash. Eggplant. Herbs. Peppers. Onions. Cucumbers. Brussels sprouts. Fruits Berries. Bananas. Apples. Oranges. Grapes. Papaya. Mango. Pomegranate. Kiwi. Grapefruit. Cherries. Meats and Other Protein Sources Seafood. Lean meats, such as chicken and [place] or lean cuts of pork and beef. Tofu. Eggs. Nuts. Beans. Dairy Low-fat or fat-free dairy products, such as yogurt, cottage cheese, and cheese. Beverages Water. Tea. Coffee. Sugar-free or diet soda. Seltzer water. Milk. Milk alternatives, such as soy or almond milk. Condiments Mustard. Relish. Low-fat, low-sugar ketchup. Low-fat, low-sugar barbecue sauce. Low-fat or fat-free mayonnaise. Sweets and Desserts Sugar-free or low-fat pudding. Sugar-free or low-fat ice cream and other frozen treats. Fats and Oils Avocado. Walnuts. Olive oil. The items listed above may not be a complete list of recommended foods or beverages. Contact your dietitian for more options. What foods are not recommended? Grains Refined white flour and flour products, such as bread, pasta, snack foods, and cereals. Beverages Sweetened drinks, such as sweet iced tea and soda. Sweets and Desserts Baked goods, such as cake, cupcakes, pastries, cookies, and cheesecake. The items listed above may not be a complete list of foods and beverages to avoid. Contact your dietitian for more information. This information is not intended to replace advice  given to you by your health care provider. Make sure you discuss any questions you have with your health care provider. Document Released: 07/03/2014 Document Revised: 07/25/2015 Document Reviewed: 03/14/2014 Elsevier Interactive Patient Education  2017 [employer].

## 2018-02-17 DIAGNOSIS — Z96642 Presence of left artificial hip joint: Secondary | ICD-10-CM | POA: Diagnosis not present

## 2018-02-17 DIAGNOSIS — Z4789 Encounter for other orthopedic aftercare: Secondary | ICD-10-CM | POA: Diagnosis not present

## 2018-02-17 DIAGNOSIS — R531 Weakness: Secondary | ICD-10-CM | POA: Diagnosis not present

## 2018-02-17 DIAGNOSIS — M5126 Other intervertebral disc displacement, lumbar region: Secondary | ICD-10-CM | POA: Diagnosis not present

## 2018-02-25 ENCOUNTER — Other Ambulatory Visit: Payer: Self-pay

## 2018-02-25 NOTE — Patient Outreach (Signed)
[name] [name] [facility]) Care Management  02/25/2018  [name] [name] 1940/05/17 [id number]   Medication Adherence call to [name] spoke with patient he did not want to provide with any of his information patient is due on Losartan 100 mg.  [name] is showing past due under [place].     [facility] Management Direct Dial [phone]  Fax [phone] [email or web address]

## 2018-03-01 DIAGNOSIS — M5126 Other intervertebral disc displacement, lumbar region: Secondary | ICD-10-CM | POA: Diagnosis not present

## 2018-03-01 DIAGNOSIS — R531 Weakness: Secondary | ICD-10-CM | POA: Diagnosis not present

## 2018-03-01 DIAGNOSIS — Z4789 Encounter for other orthopedic aftercare: Secondary | ICD-10-CM | POA: Diagnosis not present

## 2018-03-01 DIAGNOSIS — Z96642 Presence of left artificial hip joint: Secondary | ICD-10-CM | POA: Diagnosis not present

## 2018-03-05 ENCOUNTER — Other Ambulatory Visit: Payer: Self-pay | Admitting: Gastroenterology

## 2018-03-08 ENCOUNTER — Encounter: Payer: Self-pay | Admitting: Internal Medicine

## 2018-03-09 DIAGNOSIS — I10 Essential (primary) hypertension: Secondary | ICD-10-CM | POA: Diagnosis not present

## 2018-03-09 DIAGNOSIS — R29898 Other symptoms and signs involving the musculoskeletal system: Secondary | ICD-10-CM | POA: Diagnosis not present

## 2018-03-18 ENCOUNTER — Other Ambulatory Visit (HOSPITAL_COMMUNITY): Payer: Self-pay | Admitting: Neurological Surgery

## 2018-03-18 ENCOUNTER — Other Ambulatory Visit: Payer: Self-pay | Admitting: Neurological Surgery

## 2018-03-18 DIAGNOSIS — R29898 Other symptoms and signs involving the musculoskeletal system: Secondary | ICD-10-CM

## 2018-03-21 MED ORDER — SODIUM CHLORIDE 0.9 % IV SOLN: 4.0000 mg | Freq: Four times a day (QID) | INTRAVENOUS | Status: DC | PRN

## 2018-03-24 ENCOUNTER — Other Ambulatory Visit: Payer: Self-pay

## 2018-03-24 ENCOUNTER — Ambulatory Visit (HOSPITAL_COMMUNITY)
Admission: RE | Admit: 2018-03-24 | Discharge: 2018-03-24 | Disposition: A | Discharge status: Home / Self Care | Payer: Medicare Other | Source: Ambulatory Visit | Attending: Neurological Surgery | Admitting: Neurological Surgery

## 2018-03-24 DIAGNOSIS — M48061 Spinal stenosis, lumbar region without neurogenic claudication: Secondary | ICD-10-CM | POA: Insufficient documentation

## 2018-03-24 DIAGNOSIS — M5416 Radiculopathy, lumbar region: Secondary | ICD-10-CM | POA: Diagnosis not present

## 2018-03-24 DIAGNOSIS — R29898 Other symptoms and signs involving the musculoskeletal system: Secondary | ICD-10-CM

## 2018-03-24 DIAGNOSIS — M2578 Osteophyte, vertebrae: Secondary | ICD-10-CM | POA: Insufficient documentation

## 2018-03-24 DIAGNOSIS — E210 Primary hyperparathyroidism: Secondary | ICD-10-CM

## 2018-03-24 DIAGNOSIS — M5116 Intervertebral disc disorders with radiculopathy, lumbar region: Secondary | ICD-10-CM | POA: Insufficient documentation

## 2018-03-24 MED ORDER — HYDROCODONE-ACETAMINOPHEN 5-325 MG PO TABS: 1.0000 | ORAL_TABLET | ORAL | Status: DC | PRN

## 2018-03-24 MED ORDER — LIDOCAINE HCL (PF) 1 % IJ SOLN
5.0000 mL | Freq: Once | INTRAMUSCULAR | Status: AC
  Administered 2018-03-24: 2 mL via INTRADERMAL

## 2018-03-24 MED ORDER — ONDANSETRON HCL 4 MG/2ML IJ SOLN: 4.0000 mg | Freq: Four times a day (QID) | INTRAMUSCULAR | Status: DC | PRN

## 2018-03-24 MED ORDER — IOPAMIDOL (ISOVUE-M 200) INJECTION 41%
20.0000 mL | Freq: Once | INTRAMUSCULAR | Status: AC
  Administered 2018-03-24: 10 mL via INTRATHECAL

## 2018-03-24 MED ORDER — DIAZEPAM 5 MG PO TABS
10.0000 mg | ORAL_TABLET | Freq: Once | ORAL | Status: AC
  Administered 2018-03-24: 10 mg via ORAL

## 2018-03-24 MED ORDER — DIAZEPAM 5 MG PO TABS
ORAL_TABLET | ORAL | Status: AC
  Administered 2018-03-24: 10 mg via ORAL
  Filled 2018-03-24: qty 2

## 2018-03-24 NOTE — Procedures (Signed)
[name] is a 78 year old male who had a large extruded fragment of disc at L2-L3 on the left side he has had chronic hip pain secondary to a hip arthroplasty and he had weakness in that left leg he had a discectomy to decompress this process but he has had a right foot drop that has evolved and been improving then worsening over the past 6 months time.  He has other spondylosis but none very severe at other levels in his lumbar spine and a myelogram with motion films including flexion-extension views has been suggested to better evaluate this process.  Pre op Dx: Chronic lumbar radiculopathy Post op Dx: Same Procedure: Lumbar myelogram with flexion extension Surgeon: Jamontae Thwaites Puncture level: L2-L3 Fluid color: Clear colorless Injection: Isovue-200, 10 mL Findings: Severe stenosis L2-L3 with relative stenosis L4-L5 further evaluation with CT scanning

## 2018-03-24 NOTE — Discharge Instructions (Signed)
Myelogram, Care After ° °These instructions give you information about caring for yourself after your procedure. Your doctor may also give you more specific instructions. Call your doctor if you have any problems or questions after your procedure. °Follow these instructions at home: °· Drink enough fluid to keep your pee (urine) clear or pale yellow. °· Rest as told by your doctor. °· Lie flat with your head slightly raised (elevated). °· Do not bend, lift, or do any hard activities for 24-48 hours or as told by your doctor. °· Take over-the-counter and prescription medicines only as told by your doctor. °· Take care of and remove your bandage (dressing) as told by your doctor. °· Bathe or shower as told by your doctor. °Contact a health care provider if: °· You have a fever. °· You have a headache that lasts longer than 24 hours. °· You feel sick to your stomach (nauseous). °· You throw up (vomit). °· Your neck is stiff. °· Your legs feel numb. °· You cannot pee. °· You cannot poop (have a bowel movement). °· You have a rash. °· You are itchy or sneezing. °Get help right away if: °· You have new symptoms or your symptoms get worse. °· You have a seizure. °· You have trouble breathing. °This information is not intended to replace advice given to you by your health care provider. Make sure you discuss any questions you have with your health care provider. °Document Released: 11/26/2007 Document Revised: 10/17/2015 Document Reviewed: 11/29/2014 °Elsevier Interactive Patient Education © 2019 Elsevier Inc. ° °

## 2018-03-30 DIAGNOSIS — M48062 Spinal stenosis, lumbar region with neurogenic claudication: Secondary | ICD-10-CM | POA: Diagnosis not present

## 2018-03-30 DIAGNOSIS — M4316 Spondylolisthesis, lumbar region: Secondary | ICD-10-CM | POA: Diagnosis not present

## 2018-04-01 ENCOUNTER — Other Ambulatory Visit: Payer: Self-pay | Admitting: Adult Health

## 2018-04-02 HISTORY — PX: BACK SURGERY: SHX140

## 2018-04-05 ENCOUNTER — Other Ambulatory Visit: Payer: Self-pay | Admitting: Neurological Surgery

## 2018-04-08 ENCOUNTER — Other Ambulatory Visit: Payer: Self-pay | Admitting: Adult Health

## 2018-04-08 DIAGNOSIS — M25552 Pain in left hip: Secondary | ICD-10-CM | POA: Diagnosis not present

## 2018-04-20 ENCOUNTER — Ambulatory Visit: Payer: Self-pay | Admitting: Adult Health

## 2018-04-22 NOTE — Pre-Procedure Instructions (Signed)
[name] [name]  04/22/2018      [place] [place] [address] [place] [address] Phone: [phone] Fax: [phone]    Your procedure is scheduled on Friday, February 28th.  Report to [facility] Admitting Entrance "A" at 9:30 A.M.  Call this number if you have problems the morning of surgery:  [phone]   Remember:  Do not eat or drink after midnight.    Take these medicines the morning of surgery with A SIP OF WATER  finasteride (PROSCAR)  pantoprazole (PROTONIX) tamsulosin (FLOMAX)   Follow your surgeon's instructions on when to stop Asprin.  If no instructions were given by your surgeon then you will need to call the office to get those instructions.    As of today, STOP taking any other  Aspirin products, Aleve, Naproxen, Ibuprofen, Motrin, Advil, Goody's, BC's, all herbal medications, fish oil, and all vitamins. This includes: diclofenac (VOLTAREN).      Do not wear jewelry.  Do not wear lotions, powders, or colognes, or deodorant.  Do not shave 48 hours prior to surgery.  Men may shave face.  Do not bring valuables to the hospital.  [facility] is not responsible for any belongings or valuables.  Contacts, dentures or bridgework may not be worn into surgery.  Leave your suitcase in the car.  After surgery it may be brought to your room.  For patients admitted to the hospital, discharge time will be determined by your treatment team.  Patients discharged the day of surgery will not be allowed to drive home.   Special instructions:   [place]- Preparing For Surgery  Before surgery, you can play an important role. Because skin is not sterile, your skin needs to be as free of germs as possible. You can reduce the number of germs on your skin by washing with CHG (chlorahexidine gluconate) Soap before surgery.  CHG is an antiseptic cleaner which kills germs and bonds with the skin to continue  killing germs even after washing.    Oral Hygiene is also important to reduce your risk of infection.  Remember - BRUSH YOUR TEETH THE MORNING OF SURGERY WITH YOUR REGULAR TOOTHPASTE  Please do not use if you have an allergy to CHG or antibacterial soaps. If your skin becomes reddened/irritated stop using the CHG.  Do not shave (including legs and underarms) for at least 48 hours prior to first CHG shower. It is OK to shave your face.  Please follow these instructions carefully.   1. Shower the NIGHT BEFORE SURGERY and the MORNING OF SURGERY with CHG.   2. If you chose to wash your hair, wash your hair first as usual with your normal shampoo.  3. After you shampoo, rinse your hair and body thoroughly to remove the shampoo.  4. Use CHG as you would any other liquid soap. You can apply CHG directly to the skin and wash gently with a scrungie or a clean washcloth.   5. Apply the CHG Soap to your body ONLY FROM THE NECK DOWN.  Do not use on open wounds or open sores. Avoid contact with your eyes, ears, mouth and genitals (private parts). Wash Face and genitals (private parts)  with your normal soap.  6. Wash thoroughly, paying special attention to the area where your surgery will be performed.  7. Thoroughly rinse your body with warm water from the neck down.  8. DO NOT  shower/wash with your normal soap after using and rinsing off the CHG Soap.  9. Pat yourself dry with a CLEAN TOWEL.  10. Wear CLEAN PAJAMAS to bed the night before surgery, wear comfortable clothes the morning of surgery  11. Place CLEAN SHEETS on your bed the night of your first shower and DO NOT SLEEP WITH PETS.    Day of Surgery:  Do not apply any deodorants/lotions.  Please wear clean clothes to the hospital/surgery center.   Remember to brush your teeth WITH YOUR REGULAR TOOTHPASTE.   Please read over the following fact sheets that you were given.

## 2018-04-25 ENCOUNTER — Other Ambulatory Visit: Payer: Self-pay

## 2018-04-25 ENCOUNTER — Encounter (HOSPITAL_COMMUNITY)
Admission: RE | Admit: 2018-04-25 | Discharge: 2018-04-25 | Disposition: A | Discharge status: Home / Self Care | Payer: Medicare Other | Source: Ambulatory Visit | Attending: Neurological Surgery | Admitting: Neurological Surgery

## 2018-04-25 ENCOUNTER — Encounter (HOSPITAL_COMMUNITY): Payer: Self-pay

## 2018-04-25 DIAGNOSIS — Z01812 Encounter for preprocedural laboratory examination: Secondary | ICD-10-CM | POA: Diagnosis not present

## 2018-04-25 LAB — CBC
HCT: 39.5 % (ref 39.0–52.0)
Hemoglobin: 11.6 g/dL — ABNORMAL LOW (ref 13.0–17.0)
MCH: 24.3 pg — ABNORMAL LOW (ref 26.0–34.0)
MCHC: 29.4 g/dL — ABNORMAL LOW (ref 30.0–36.0)
MCV: 82.8 fL (ref 80.0–100.0)
Platelets: 360 10*3/uL (ref 150–400)
RBC: 4.77 MIL/uL (ref 4.22–5.81)
RDW: 15.6 % — ABNORMAL HIGH (ref 11.5–15.5)
WBC: 7.8 10*3/uL (ref 4.0–10.5)
nRBC: 0 % (ref 0.0–0.2)

## 2018-04-25 LAB — COMPREHENSIVE METABOLIC PANEL
ALT: 22 U/L (ref 0–44)
AST: 28 U/L (ref 15–41)
Albumin: 3.7 g/dL (ref 3.5–5.0)
Alkaline Phosphatase: 33 U/L — ABNORMAL LOW (ref 38–126)
Anion gap: 12 (ref 5–15)
BUN: 20 mg/dL (ref 8–23)
CO2: 20 mmol/L — ABNORMAL LOW (ref 22–32)
Calcium: 9.4 mg/dL (ref 8.9–10.3)
Chloride: 105 mmol/L (ref 98–111)
Creatinine, Ser: 1.54 mg/dL — ABNORMAL HIGH (ref 0.61–1.24)
GFR calc Af Amer: 50 mL/min — ABNORMAL LOW (ref >60)
GFR calc non Af Amer: 43 mL/min — ABNORMAL LOW (ref >60)
Glucose, Bld: 151 mg/dL — ABNORMAL HIGH (ref 70–99)
Potassium: 3.8 mmol/L (ref 3.5–5.1)
Sodium: 137 mmol/L (ref 135–145)
Total Bilirubin: 0.7 mg/dL (ref 0.3–1.2)
Total Protein: 7.2 g/dL (ref 6.5–8.1)

## 2018-04-25 LAB — HEMOGLOBIN A1C
Hgb A1c MFr Bld: 6.9 % — ABNORMAL HIGH (ref 4.8–5.6)
Mean Plasma Glucose: 151.33 mg/dL

## 2018-04-25 LAB — SURGICAL PCR SCREEN
MRSA, PCR: NEGATIVE
Staphylococcus aureus: POSITIVE — AB

## 2018-04-25 LAB — TYPE AND SCREEN
ABO/RH(D): A POS
Antibody Screen: NEGATIVE

## 2018-04-25 LAB — GLUCOSE, CAPILLARY: Glucose-Capillary: 169 mg/dL — ABNORMAL HIGH (ref 70–99)

## 2018-04-25 NOTE — Progress Notes (Addendum)
KASPIAN MUCCIO            04/25/2018                          [place], [place] - [address] [place] [address] Phone: [phone] Fax: [phone]              Your procedure is scheduled on Friday, February 28th.            Report to [facility] Admitting Entrance "A" at 9:30 A.M.            Call this number if you have problems the morning of surgery:            [phone]             Remember:            Do not eat or drink after midnight.                        Take these medicines the morning of surgery with A SIP OF WATER  finasteride (PROSCAR)  pantoprazole (PROTONIX) tamsulosin (FLOMAX) Codeine pain pill-as needed     Follow your surgeon's instructions on when to stop Aspirin.  If no instructions were given by your surgeon then you will need to call the office to get those instructions.    As of today, STOP taking any other  Aspirin products, Aleve, Naproxen, Ibuprofen, Motrin, Advil, Goody's, BC's, all herbal medications, fish oil, and all vitamins. This includes: diclofenac (VOLTAREN) and Nabutone.                         Do not wear jewelry.            Do not wear lotions, powders, or colognes, or deodorant.            Do not shave 48 hours prior to surgery.  Men may shave face.            Do not bring valuables to the hospital.            [facility] is not responsible for any belongings or valuables.  Contacts, dentures or bridgework may not be worn into surgery.  Leave your suitcase in the car.  After surgery it may be brought to your room.  For patients admitted to the hospital, discharge time will be determined by your treatment team.  Patients discharged the day of surgery will not be allowed to drive home.   Special instructions:   [facility]- Preparing For Surgery  Before surgery, you can play an important role. Because skin is not sterile, your skin needs to be as free  of germs as possible. You can reduce the number of germs on your skin by washing with CHG (chlorahexidine gluconate) Soap before surgery.  CHG is an antiseptic cleaner which kills germs and bonds with the skin to continue killing germs even after washing.    Oral Hygiene is also important to reduce your risk of infection.  Remember - BRUSH YOUR TEETH THE MORNING OF SURGERY WITH YOUR REGULAR TOOTHPASTE  Please do not use if you have an allergy to CHG or antibacterial soaps. If your skin becomes reddened/irritated stop using the CHG.  Do not shave (including legs and underarms) for at least 48 hours prior to  first CHG shower. It is OK to shave your face.  Please follow these instructions carefully.                                                                                                                     1. Shower the NIGHT BEFORE SURGERY and the MORNING OF SURGERY with CHG.   2. If you chose to wash your hair, wash your hair first as usual with your normal shampoo.  3. After you shampoo, rinse your hair and body thoroughly to remove the shampoo.  4. Use CHG as you would any other liquid soap. You can apply CHG directly to the skin and wash gently with a scrungie or a clean washcloth.   5. Apply the CHG Soap to your body ONLY FROM THE NECK DOWN.  Do not use on open wounds or open sores. Avoid contact with your eyes, ears, mouth and genitals (private parts). Wash Face and genitals (private parts)  with your normal soap.  6. Wash thoroughly, paying special attention to the area where your surgery will be performed.  7. Thoroughly rinse your body with warm water from the neck down.  8. DO NOT shower/wash with your normal soap after using and rinsing off the CHG Soap.  9. Pat yourself dry with a CLEAN TOWEL.  10. Wear CLEAN PAJAMAS to bed the night before surgery, wear comfortable clothes the morning of surgery  11. Place CLEAN SHEETS on your bed the night of your first  shower and DO NOT SLEEP WITH PETS.  Day of Surgery:  Do not apply any deodorants/lotions.  Please wear clean clothes to the hospital/surgery center.   Remember to brush your teeth WITH YOUR REGULAR TOOTHPASTE.   Please read over the following fact sheets that you were given.

## 2018-04-25 NOTE — Progress Notes (Signed)
   04/25/18 0947  OBSTRUCTIVE SLEEP APNEA  Have you ever been diagnosed with sleep apnea through a sleep study? No  Do you snore loudly (loud enough to be heard through closed doors)?  1  Do you often feel tired, fatigued, or sleepy during the daytime (such as falling asleep during driving or talking to someone)? 0  Has anyone observed you stop breathing during your sleep? 0  Do you have, or are you being treated for high blood pressure? 1  BMI more than 35 kg/m2? 1  Age > 50 (1-yes) 1  Neck circumference greater than:Male 16 inches or larger, Male 17inches or larger? 1 (10)  Male Gender (Yes=1) 1  Obstructive Sleep Apnea Score 6  Score 5 or greater  Results sent to PCP

## 2018-04-25 NOTE — Progress Notes (Addendum)
PCP - Dr. Dorien Chihuahua  Cardiologist - Denies  Chest x-ray - 05/12/17 (E)  EKG - 01/13/18 (E)  Stress Test - Denies  ECHO - 05/12/17 (E)  Cardiac Cath - Denies  AICD-na PM-na LOOP-na  Sleep Study - Denies- Positive Stop Bang- sent to pcp. Pt sts he was to have it done, bu it never happened. CPAP - none  LABS- 04/25/2018: CBC, CMP, T/S, PCR  ASA- LD- 2/21  HA1C- 04/25/2018 Fasting Blood Sugar - 100-170, today 169 Checks Blood Sugar __2___ times a week- PreDiabetic  Pt sts he has a meter, and just checks the bs when is wants. He does not have to check it daily, and he takes no medicine.  Anesthesia- No  Pt denies having chest pain, sob, or fever at this time. All instructions explained to the pt, with a verbal understanding of the material. Pt agrees to go over the instructions while at home for a better understanding. The opportunity to ask questions was provided.

## 2018-04-28 MED ORDER — DEXTROSE 5 % IV SOLN
3.0000 g | INTRAVENOUS | Status: AC
  Administered 2018-04-29: 3 g via INTRAVENOUS
  Filled 2018-04-28: qty 3

## 2018-04-29 ENCOUNTER — Inpatient Hospital Stay (HOSPITAL_COMMUNITY)
Admission: RE | Admit: 2018-04-29 | Discharge: 2018-05-02 | DRG: 455 | Disposition: A | Discharge status: Home / Self Care | Payer: Medicare Other | Attending: Neurological Surgery | Admitting: Neurological Surgery

## 2018-04-29 ENCOUNTER — Inpatient Hospital Stay (HOSPITAL_COMMUNITY): Payer: Medicare Other

## 2018-04-29 ENCOUNTER — Encounter (HOSPITAL_COMMUNITY)
Admission: RE | Disposition: A | Discharge status: Home / Self Care | Payer: Self-pay | Source: Home / Self Care | Attending: Neurological Surgery

## 2018-04-29 ENCOUNTER — Inpatient Hospital Stay (HOSPITAL_COMMUNITY): Payer: Medicare Other | Admitting: Anesthesiology

## 2018-04-29 ENCOUNTER — Encounter (HOSPITAL_COMMUNITY): Payer: Self-pay

## 2018-04-29 DIAGNOSIS — M4726 Other spondylosis with radiculopathy, lumbar region: Secondary | ICD-10-CM | POA: Diagnosis not present

## 2018-04-29 DIAGNOSIS — I10 Essential (primary) hypertension: Secondary | ICD-10-CM | POA: Diagnosis present

## 2018-04-29 DIAGNOSIS — E785 Hyperlipidemia, unspecified: Secondary | ICD-10-CM | POA: Diagnosis present

## 2018-04-29 DIAGNOSIS — Z96642 Presence of left artificial hip joint: Secondary | ICD-10-CM | POA: Diagnosis present

## 2018-04-29 DIAGNOSIS — Z8249 Family history of ischemic heart disease and other diseases of the circulatory system: Secondary | ICD-10-CM | POA: Diagnosis not present

## 2018-04-29 DIAGNOSIS — Z87440 Personal history of urinary (tract) infections: Secondary | ICD-10-CM

## 2018-04-29 DIAGNOSIS — Z8619 Personal history of other infectious and parasitic diseases: Secondary | ICD-10-CM

## 2018-04-29 DIAGNOSIS — M4326 Fusion of spine, lumbar region: Secondary | ICD-10-CM | POA: Diagnosis not present

## 2018-04-29 DIAGNOSIS — Z91041 Radiographic dye allergy status: Secondary | ICD-10-CM

## 2018-04-29 DIAGNOSIS — Z8052 Family history of malignant neoplasm of bladder: Secondary | ICD-10-CM

## 2018-04-29 DIAGNOSIS — M5116 Intervertebral disc disorders with radiculopathy, lumbar region: Secondary | ICD-10-CM | POA: Diagnosis not present

## 2018-04-29 DIAGNOSIS — Z419 Encounter for procedure for purposes other than remedying health state, unspecified: Secondary | ICD-10-CM

## 2018-04-29 DIAGNOSIS — Z820 Family history of epilepsy and other diseases of the nervous system: Secondary | ICD-10-CM | POA: Diagnosis not present

## 2018-04-29 DIAGNOSIS — K219 Gastro-esophageal reflux disease without esophagitis: Secondary | ICD-10-CM | POA: Diagnosis not present

## 2018-04-29 DIAGNOSIS — M5126 Other intervertebral disc displacement, lumbar region: Secondary | ICD-10-CM | POA: Diagnosis present

## 2018-04-29 DIAGNOSIS — M1612 Unilateral primary osteoarthritis, left hip: Secondary | ICD-10-CM | POA: Diagnosis present

## 2018-04-29 DIAGNOSIS — M48062 Spinal stenosis, lumbar region with neurogenic claudication: Secondary | ICD-10-CM | POA: Diagnosis present

## 2018-04-29 DIAGNOSIS — Z87891 Personal history of nicotine dependence: Secondary | ICD-10-CM | POA: Diagnosis not present

## 2018-04-29 DIAGNOSIS — M21371 Foot drop, right foot: Secondary | ICD-10-CM | POA: Diagnosis present

## 2018-04-29 LAB — GLUCOSE, CAPILLARY
Glucose-Capillary: 124 mg/dL — ABNORMAL HIGH (ref 70–99)
Glucose-Capillary: 138 mg/dL — ABNORMAL HIGH (ref 70–99)

## 2018-04-29 SURGERY — POSTERIOR LUMBAR FUSION 1 LEVEL
Anesthesia: General | Site: Spine Lumbar

## 2018-04-29 MED ORDER — PANTOPRAZOLE SODIUM 40 MG PO TBEC
40.0000 mg | DELAYED_RELEASE_TABLET | Freq: Every day | ORAL | Status: DC
  Administered 2018-04-30 – 2018-05-02 (×3): 40 mg via ORAL
  Filled 2018-04-29 (×3): qty 1

## 2018-04-29 MED ORDER — SODIUM CHLORIDE 0.9 % IV SOLN
INTRAVENOUS | Status: DC | PRN
  Administered 2018-04-29: 19:00:00

## 2018-04-29 MED ORDER — FLEET ENEMA 7-19 GM/118ML RE ENEM: 1.0000 | ENEMA | Freq: Once | RECTAL | Status: DC | PRN

## 2018-04-29 MED ORDER — THROMBIN 5000 UNITS EX SOLR
OROMUCOSAL | Status: DC | PRN
  Administered 2018-04-29: 19:00:00 via TOPICAL

## 2018-04-29 MED ORDER — SODIUM CHLORIDE 0.9 % IV SOLN
INTRAVENOUS | Status: DC | PRN
  Administered 2018-04-29: 40 ug/min via INTRAVENOUS

## 2018-04-29 MED ORDER — OXYCODONE HCL 5 MG/5ML PO SOLN: 5.0000 mg | Freq: Once | ORAL | Status: DC | PRN

## 2018-04-29 MED ORDER — FENTANYL CITRATE (PF) 250 MCG/5ML IJ SOLN
INTRAMUSCULAR | Status: AC
  Filled 2018-04-29: qty 5

## 2018-04-29 MED ORDER — MIDAZOLAM HCL 2 MG/2ML IJ SOLN
INTRAMUSCULAR | Status: AC
  Filled 2018-04-29: qty 2

## 2018-04-29 MED ORDER — BISACODYL 10 MG RE SUPP: 10.0000 mg | Freq: Every day | RECTAL | Status: DC | PRN

## 2018-04-29 MED ORDER — GLYCOPYRROLATE PF 0.2 MG/ML IJ SOSY
PREFILLED_SYRINGE | INTRAMUSCULAR | Status: AC
  Filled 2018-04-29: qty 1

## 2018-04-29 MED ORDER — LIDOCAINE 2% (20 MG/ML) 5 ML SYRINGE
INTRAMUSCULAR | Status: DC | PRN
  Administered 2018-04-29: 60 mg via INTRAVENOUS

## 2018-04-29 MED ORDER — EPHEDRINE 5 MG/ML INJ
INTRAVENOUS | Status: AC
  Filled 2018-04-29: qty 10

## 2018-04-29 MED ORDER — DEXAMETHASONE SODIUM PHOSPHATE 10 MG/ML IJ SOLN
INTRAMUSCULAR | Status: AC
  Filled 2018-04-29: qty 1

## 2018-04-29 MED ORDER — PHENYLEPHRINE 40 MCG/ML (10ML) SYRINGE FOR IV PUSH (FOR BLOOD PRESSURE SUPPORT)
PREFILLED_SYRINGE | INTRAVENOUS | Status: DC | PRN
  Administered 2018-04-29: 120 ug via INTRAVENOUS
  Administered 2018-04-29: 40 ug via INTRAVENOUS

## 2018-04-29 MED ORDER — MENTHOL 3 MG MT LOZG: 1.0000 | LOZENGE | OROMUCOSAL | Status: DC | PRN

## 2018-04-29 MED ORDER — OXYCODONE HCL 5 MG PO TABS: 5.0000 mg | ORAL_TABLET | Freq: Once | ORAL | Status: DC | PRN

## 2018-04-29 MED ORDER — LIDOCAINE-EPINEPHRINE 1 %-1:100000 IJ SOLN
INTRAMUSCULAR | Status: DC | PRN
  Administered 2018-04-29: 10 mL

## 2018-04-29 MED ORDER — ROCURONIUM BROMIDE 50 MG/5ML IV SOSY
PREFILLED_SYRINGE | INTRAVENOUS | Status: AC
  Filled 2018-04-29: qty 5

## 2018-04-29 MED ORDER — OXYCODONE-ACETAMINOPHEN 5-325 MG PO TABS
1.0000 | ORAL_TABLET | ORAL | Status: DC | PRN
  Administered 2018-04-29 – 2018-04-30 (×4): 2 via ORAL
  Administered 2018-04-30: 1 via ORAL
  Administered 2018-05-01 (×2): 2 via ORAL
  Administered 2018-05-01 (×2): 1 via ORAL
  Administered 2018-05-02 (×2): 2 via ORAL
  Filled 2018-04-29 (×6): qty 2
  Filled 2018-04-29: qty 1
  Filled 2018-04-29 (×3): qty 2

## 2018-04-29 MED ORDER — GABAPENTIN 300 MG PO CAPS
ORAL_CAPSULE | ORAL | Status: AC
  Filled 2018-04-29: qty 1

## 2018-04-29 MED ORDER — PHENYLEPHRINE 40 MCG/ML (10ML) SYRINGE FOR IV PUSH (FOR BLOOD PRESSURE SUPPORT)
PREFILLED_SYRINGE | INTRAVENOUS | Status: AC
  Filled 2018-04-29: qty 10

## 2018-04-29 MED ORDER — ALBUMIN HUMAN 5 % IV SOLN
INTRAVENOUS | Status: DC | PRN
  Administered 2018-04-29 (×2): via INTRAVENOUS

## 2018-04-29 MED ORDER — ACETAMINOPHEN 500 MG PO TABS
ORAL_TABLET | ORAL | Status: AC
  Filled 2018-04-29: qty 1

## 2018-04-29 MED ORDER — MORPHINE SULFATE (PF) 2 MG/ML IV SOLN
2.0000 mg | INTRAVENOUS | Status: DC | PRN
  Administered 2018-04-30: 2 mg via INTRAVENOUS
  Filled 2018-04-29: qty 1

## 2018-04-29 MED ORDER — ESMOLOL HCL 100 MG/10ML IV SOLN
INTRAVENOUS | Status: AC
  Filled 2018-04-29: qty 20

## 2018-04-29 MED ORDER — SODIUM CHLORIDE (PF) 0.9 % IJ SOLN
INTRAMUSCULAR | Status: AC
  Filled 2018-04-29: qty 10

## 2018-04-29 MED ORDER — ARTIFICIAL TEARS OPHTHALMIC OINT
TOPICAL_OINTMENT | OPHTHALMIC | Status: AC
  Filled 2018-04-29: qty 3.5

## 2018-04-29 MED ORDER — SUGAMMADEX SODIUM 200 MG/2ML IV SOLN
INTRAVENOUS | Status: DC | PRN
  Administered 2018-04-29: 100 mg via INTRAVENOUS

## 2018-04-29 MED ORDER — LACTATED RINGERS IV SOLN
INTRAVENOUS | Status: DC | PRN
  Administered 2018-04-29 (×2): via INTRAVENOUS

## 2018-04-29 MED ORDER — SODIUM CHLORIDE 0.9% FLUSH
3.0000 mL | INTRAVENOUS | Status: DC | PRN
  Administered 2018-04-29: 3 mL via INTRAVENOUS
  Filled 2018-04-29: qty 3

## 2018-04-29 MED ORDER — 0.9 % SODIUM CHLORIDE (POUR BTL) OPTIME
TOPICAL | Status: DC | PRN
  Administered 2018-04-29: 1000 mL

## 2018-04-29 MED ORDER — LACTATED RINGERS IV SOLN
INTRAVENOUS | Status: DC
  Administered 2018-04-30: via INTRAVENOUS

## 2018-04-29 MED ORDER — ONDANSETRON HCL 4 MG/2ML IJ SOLN
INTRAMUSCULAR | Status: AC
  Filled 2018-04-29: qty 2

## 2018-04-29 MED ORDER — POLYETHYLENE GLYCOL 3350 17 G PO PACK: 17.0000 g | PACK | Freq: Every day | ORAL | Status: DC | PRN

## 2018-04-29 MED ORDER — LIDOCAINE 2% (20 MG/ML) 5 ML SYRINGE
INTRAMUSCULAR | Status: AC
  Filled 2018-04-29: qty 5

## 2018-04-29 MED ORDER — ONDANSETRON HCL 4 MG/2ML IJ SOLN: 4.0000 mg | Freq: Once | INTRAMUSCULAR | Status: DC | PRN

## 2018-04-29 MED ORDER — ONDANSETRON HCL 4 MG/2ML IJ SOLN
INTRAMUSCULAR | Status: DC | PRN
  Administered 2018-04-29: 4 mg via INTRAVENOUS

## 2018-04-29 MED ORDER — LOSARTAN POTASSIUM 50 MG PO TABS
100.0000 mg | ORAL_TABLET | Freq: Every day | ORAL | Status: DC
  Administered 2018-04-30 – 2018-05-02 (×3): 100 mg via ORAL
  Filled 2018-04-29 (×3): qty 2

## 2018-04-29 MED ORDER — ALUM & MAG HYDROXIDE-SIMETH 200-200-20 MG/5ML PO SUSP: 30.0000 mL | Freq: Four times a day (QID) | ORAL | Status: DC | PRN

## 2018-04-29 MED ORDER — CHLORHEXIDINE GLUCONATE CLOTH 2 % EX PADS: 6.0000 | MEDICATED_PAD | Freq: Once | CUTANEOUS | Status: DC

## 2018-04-29 MED ORDER — DEXAMETHASONE SODIUM PHOSPHATE 10 MG/ML IJ SOLN
INTRAMUSCULAR | Status: DC | PRN
  Administered 2018-04-29: 10 mg via INTRAVENOUS

## 2018-04-29 MED ORDER — FENTANYL CITRATE (PF) 100 MCG/2ML IJ SOLN
INTRAMUSCULAR | Status: AC
  Filled 2018-04-29: qty 2

## 2018-04-29 MED ORDER — FENTANYL CITRATE (PF) 100 MCG/2ML IJ SOLN
INTRAMUSCULAR | Status: DC | PRN
  Administered 2018-04-29: 50 ug via INTRAVENOUS
  Administered 2018-04-29: 150 ug via INTRAVENOUS
  Administered 2018-04-29: 50 ug via INTRAVENOUS

## 2018-04-29 MED ORDER — NEOSTIGMINE METHYLSULFATE 3 MG/3ML IV SOSY
PREFILLED_SYRINGE | INTRAVENOUS | Status: AC
  Filled 2018-04-29: qty 3

## 2018-04-29 MED ORDER — MAGNESIUM OXIDE 250 MG PO TABS: 250.0000 mg | ORAL_TABLET | Freq: Every day | ORAL | Status: DC

## 2018-04-29 MED ORDER — ONDANSETRON HCL 4 MG/2ML IJ SOLN
INTRAMUSCULAR | Status: AC
  Filled 2018-04-29: qty 4

## 2018-04-29 MED ORDER — ROCURONIUM BROMIDE 10 MG/ML (PF) SYRINGE
PREFILLED_SYRINGE | INTRAVENOUS | Status: DC | PRN
  Administered 2018-04-29: 20 mg via INTRAVENOUS
  Administered 2018-04-29: 50 mg via INTRAVENOUS

## 2018-04-29 MED ORDER — FENOFIBRATE 160 MG PO TABS
160.0000 mg | ORAL_TABLET | Freq: Every day | ORAL | Status: DC
  Administered 2018-04-30 – 2018-05-02 (×3): 160 mg via ORAL
  Filled 2018-04-29 (×3): qty 1

## 2018-04-29 MED ORDER — THROMBIN 20000 UNITS EX SOLR
CUTANEOUS | Status: DC | PRN
  Administered 2018-04-29: 19:00:00 via TOPICAL

## 2018-04-29 MED ORDER — TAMSULOSIN HCL 0.4 MG PO CAPS
0.4000 mg | ORAL_CAPSULE | Freq: Every day | ORAL | Status: DC
  Administered 2018-04-30 – 2018-05-02 (×3): 0.4 mg via ORAL
  Filled 2018-04-29 (×3): qty 1

## 2018-04-29 MED ORDER — LIDOCAINE 2% (20 MG/ML) 5 ML SYRINGE
INTRAMUSCULAR | Status: AC
  Filled 2018-04-29: qty 10

## 2018-04-29 MED ORDER — BUPIVACAINE HCL (PF) 0.5 % IJ SOLN
INTRAMUSCULAR | Status: DC | PRN
  Administered 2018-04-29: 30 mL

## 2018-04-29 MED ORDER — PROPOFOL 10 MG/ML IV BOLUS
INTRAVENOUS | Status: DC | PRN
  Administered 2018-04-29: 170 mg via INTRAVENOUS

## 2018-04-29 MED ORDER — ROCURONIUM BROMIDE 50 MG/5ML IV SOSY
PREFILLED_SYRINGE | INTRAVENOUS | Status: AC
  Filled 2018-04-29: qty 10

## 2018-04-29 MED ORDER — PHENOL 1.4 % MT LIQD: 1.0000 | OROMUCOSAL | Status: DC | PRN

## 2018-04-29 MED ORDER — SUCCINYLCHOLINE CHLORIDE 200 MG/10ML IV SOSY
PREFILLED_SYRINGE | INTRAVENOUS | Status: AC
  Filled 2018-04-29: qty 10

## 2018-04-29 MED ORDER — SODIUM CHLORIDE 0.9% FLUSH
3.0000 mL | Freq: Two times a day (BID) | INTRAVENOUS | Status: DC
  Administered 2018-04-29 – 2018-05-02 (×6): 3 mL via INTRAVENOUS

## 2018-04-29 MED ORDER — LACTATED RINGERS IV SOLN
INTRAVENOUS | Status: DC
  Administered 2018-04-29: 16:00:00 via INTRAVENOUS

## 2018-04-29 MED ORDER — KETOROLAC TROMETHAMINE 15 MG/ML IJ SOLN: 7.5000 mg | Freq: Four times a day (QID) | INTRAMUSCULAR | Status: DC

## 2018-04-29 MED ORDER — ACETAMINOPHEN 650 MG RE SUPP: 650.0000 mg | RECTAL | Status: DC | PRN

## 2018-04-29 MED ORDER — ONDANSETRON HCL 4 MG PO TABS: 4.0000 mg | ORAL_TABLET | Freq: Four times a day (QID) | ORAL | Status: DC | PRN

## 2018-04-29 MED ORDER — SENNA 8.6 MG PO TABS
1.0000 | ORAL_TABLET | Freq: Two times a day (BID) | ORAL | Status: DC
  Administered 2018-04-29 – 2018-05-02 (×4): 8.6 mg via ORAL
  Filled 2018-04-29 (×6): qty 1

## 2018-04-29 MED ORDER — PROPOFOL 10 MG/ML IV BOLUS
INTRAVENOUS | Status: AC
  Filled 2018-04-29: qty 20

## 2018-04-29 MED ORDER — ACETAMINOPHEN 325 MG PO TABS: 650.0000 mg | ORAL_TABLET | ORAL | Status: DC | PRN

## 2018-04-29 MED ORDER — FENTANYL CITRATE (PF) 100 MCG/2ML IJ SOLN
25.0000 ug | INTRAMUSCULAR | Status: DC | PRN
  Administered 2018-04-29 (×2): 50 ug via INTRAVENOUS

## 2018-04-29 MED ORDER — SODIUM CHLORIDE 0.9 % IV SOLN: 250.0000 mL | INTRAVENOUS | Status: DC

## 2018-04-29 MED ORDER — FINASTERIDE 5 MG PO TABS
5.0000 mg | ORAL_TABLET | Freq: Every day | ORAL | Status: DC
  Administered 2018-04-30 – 2018-05-02 (×3): 5 mg via ORAL
  Filled 2018-04-29 (×3): qty 1

## 2018-04-29 MED ORDER — DOCUSATE SODIUM 100 MG PO CAPS
100.0000 mg | ORAL_CAPSULE | Freq: Two times a day (BID) | ORAL | Status: DC
  Administered 2018-04-29 – 2018-05-02 (×5): 100 mg via ORAL
  Filled 2018-04-29 (×6): qty 1

## 2018-04-29 MED ORDER — ONDANSETRON HCL 4 MG/2ML IJ SOLN: 4.0000 mg | Freq: Four times a day (QID) | INTRAMUSCULAR | Status: DC | PRN

## 2018-04-29 MED ORDER — BISOPROLOL-HYDROCHLOROTHIAZIDE 5-6.25 MG PO TABS
1.0000 | ORAL_TABLET | Freq: Every day | ORAL | Status: DC
  Administered 2018-04-30 – 2018-05-02 (×3): 1 via ORAL
  Filled 2018-04-29 (×3): qty 1

## 2018-04-29 MED ORDER — CEFAZOLIN SODIUM-DEXTROSE 2-4 GM/100ML-% IV SOLN
2.0000 g | Freq: Three times a day (TID) | INTRAVENOUS | Status: AC
  Administered 2018-04-30 (×2): 2 g via INTRAVENOUS
  Filled 2018-04-29 (×2): qty 100

## 2018-04-29 SURGICAL SUPPLY — 73 items
ADH SKN CLS APL DERMABOND .7 (GAUZE/BANDAGES/DRESSINGS) ×1
APL SRG 60D 8 XTD TIP BNDBL (TIP)
BAG DECANTER FOR FLEXI CONT (MISCELLANEOUS) ×2 IMPLANT
BASKET BONE COLLECTION (BASKET) ×2 IMPLANT
BLADE CLIPPER SURG (BLADE) ×1 IMPLANT
BONE CANC CHIPS 20CC PCAN1/4 (Bone Implant) ×2 IMPLANT
BUR MATCHSTICK NEURO 3.0 LAGG (BURR) ×2 IMPLANT
CAGE COROENT MP 8X23 (Cage) ×4 IMPLANT
CANISTER SUCT 3000ML PPV (MISCELLANEOUS) ×2 IMPLANT
CHIPS CANC BONE 20CC PCAN1/4 (Bone Implant) ×1 IMPLANT
CONT SPEC 4OZ CLIKSEAL STRL BL (MISCELLANEOUS) ×2 IMPLANT
COVER BACK TABLE 60X90IN (DRAPES) ×2 IMPLANT
COVER WAND RF STERILE (DRAPES) ×2 IMPLANT
DECANTER SPIKE VIAL GLASS SM (MISCELLANEOUS) ×2 IMPLANT
DERMABOND ADVANCED (GAUZE/BANDAGES/DRESSINGS) ×1
DERMABOND ADVANCED .7 DNX12 (GAUZE/BANDAGES/DRESSINGS) ×1 IMPLANT
DEVICE DISSECT PLASMABLAD 3.0S (MISCELLANEOUS) ×1 IMPLANT
DRAPE C-ARM 42X72 X-RAY (DRAPES) ×4 IMPLANT
DRAPE HALF SHEET 40X57 (DRAPES) IMPLANT
DRAPE LAPAROTOMY 100X72X124 (DRAPES) ×2 IMPLANT
DURAPREP 26ML APPLICATOR (WOUND CARE) ×2 IMPLANT
DURASEAL APPLICATOR TIP (TIP) IMPLANT
DURASEAL SPINE SEALANT 3ML (MISCELLANEOUS) IMPLANT
ELECT REM PT RETURN 9FT ADLT (ELECTROSURGICAL) ×2
ELECTRODE REM PT RTRN 9FT ADLT (ELECTROSURGICAL) ×1 IMPLANT
GAUZE 4X4 16PLY RFD (DISPOSABLE) IMPLANT
GAUZE SPONGE 4X4 12PLY STRL (GAUZE/BANDAGES/DRESSINGS) ×2 IMPLANT
GLOVE BIO SURGEON STRL SZ 6.5 (GLOVE) ×2 IMPLANT
GLOVE BIO SURGEON STRL SZ7 (GLOVE) ×2 IMPLANT
GLOVE BIO SURGEON STRL SZ8 (GLOVE) ×2 IMPLANT
GLOVE BIOGEL PI IND STRL 6 (GLOVE) IMPLANT
GLOVE BIOGEL PI IND STRL 7.0 (GLOVE) ×1 IMPLANT
GLOVE BIOGEL PI IND STRL 7.5 (GLOVE) IMPLANT
GLOVE BIOGEL PI IND STRL 8.5 (GLOVE) ×2 IMPLANT
GLOVE BIOGEL PI INDICATOR 6 (GLOVE) ×1
GLOVE BIOGEL PI INDICATOR 7.0 (GLOVE) ×1
GLOVE BIOGEL PI INDICATOR 7.5 (GLOVE) ×1
GLOVE BIOGEL PI INDICATOR 8.5 (GLOVE) ×2
GLOVE ECLIPSE 7.5 STRL STRAW (GLOVE) ×1 IMPLANT
GLOVE ECLIPSE 8.5 STRL (GLOVE) ×4 IMPLANT
GLOVE INDICATOR 8.5 STRL (GLOVE) ×2 IMPLANT
GLOVE SURG SS PI 6.0 STRL IVOR (GLOVE) ×2 IMPLANT
GOWN STRL REUS W/ TWL LRG LVL3 (GOWN DISPOSABLE) IMPLANT
GOWN STRL REUS W/ TWL XL LVL3 (GOWN DISPOSABLE) IMPLANT
GOWN STRL REUS W/TWL 2XL LVL3 (GOWN DISPOSABLE) ×4 IMPLANT
GOWN STRL REUS W/TWL LRG LVL3 (GOWN DISPOSABLE)
GOWN STRL REUS W/TWL XL LVL3 (GOWN DISPOSABLE)
HEMOSTAT POWDER KIT SURGIFOAM (HEMOSTASIS) IMPLANT
KIT BASIN OR (CUSTOM PROCEDURE TRAY) ×2 IMPLANT
KIT INFUSE X SMALL 1.4CC (Orthopedic Implant) ×1 IMPLANT
KIT TURNOVER KIT B (KITS) ×2 IMPLANT
MILL MEDIUM DISP (BLADE) ×2 IMPLANT
NEEDLE HYPO 22GX1.5 SAFETY (NEEDLE) ×2 IMPLANT
NS IRRIG 1000ML POUR BTL (IV SOLUTION) ×2 IMPLANT
PACK LAMINECTOMY NEURO (CUSTOM PROCEDURE TRAY) ×2 IMPLANT
PAD ARMBOARD 7.5X6 YLW CONV (MISCELLANEOUS) ×6 IMPLANT
PATTIES SURGICAL .5 X1 (DISPOSABLE) ×2 IMPLANT
PLASMABLADE 3.0S (MISCELLANEOUS) ×2
ROD RELINE LORDOTIC 5.5X45 (Rod) ×2 IMPLANT
SCREW LOCK RELINE 5.5 TULIP (Screw) ×4 IMPLANT
SCREW RELINE-O POLY 6.5X45 (Screw) ×4 IMPLANT
SPONGE LAP 4X18 RFD (DISPOSABLE) IMPLANT
SPONGE SURGIFOAM ABS GEL 100 (HEMOSTASIS) ×2 IMPLANT
SUT PROLENE 6 0 BV (SUTURE) IMPLANT
SUT VIC AB 1 CT1 18XBRD ANBCTR (SUTURE) ×1 IMPLANT
SUT VIC AB 1 CT1 8-18 (SUTURE) ×2
SUT VIC AB 2-0 CP2 18 (SUTURE) ×2 IMPLANT
SUT VIC AB 3-0 SH 8-18 (SUTURE) ×2 IMPLANT
SYR 3ML LL SCALE MARK (SYRINGE) ×8 IMPLANT
TOWEL GREEN STERILE (TOWEL DISPOSABLE) ×2 IMPLANT
TOWEL GREEN STERILE FF (TOWEL DISPOSABLE) ×2 IMPLANT
TRAY FOLEY MTR SLVR 16FR STAT (SET/KITS/TRAYS/PACK) ×2 IMPLANT
WATER STERILE IRR 1000ML POUR (IV SOLUTION) ×2 IMPLANT

## 2018-04-29 NOTE — H&P (Signed)
[name] [name] is an 78 y.o. male.   Chief Complaint: Chief complaint back and bilateral lower extremity pain HPI: Patient is a 78 year old individual who is had significant problems with back and bilateral lower extremity pain.  He has weakness in the foot drop on the right side but is demonstrated large herniated nucleus pulposus at L2-L3 with high-grade stenosis of the lumbar spine.  He underwent surgery to resect the disc herniation back in September of last year and he seemed to tolerate this well after brief relief of symptoms and improvement in his functional status he has had recurrence of pain and weakness on the left side and the right side.  Recent myelogram demonstrates presence of a large recurrence of the disc herniation at L2-L3 causing a high-grade stenosis.  He has been advised that because of the advanced degenerative changes in the disc space at L2-L3 with some retrolisthesis he should undergo decompression and stabilization with a posterior interbody fusion.  He is now admitted for this procedure.  Past Medical History:  Diagnosis Date  . Arthritis    in left hip  . ED (erectile dysfunction)   . Enlarged prostate with lower urinary tract symptoms (LUTS)   . GERD (gastroesophageal reflux disease)   . History of chronic gastritis   . History of colon polyps    hyperplastic 2006  . History of esophageal stricture    S/P  DILATATION 2009; 2010; 2010 2012  . History of kidney stones    hx. multiple kidney stones  . History of kidney stones   . History of primary hyperparathyroidism    s/p  left superior parathyroidectomy 07-10-2014  . Hyperlipidemia   . Hypertension   . Ileus ([place]) 05/02/2017  . Left ureteral stone   . Pre-diabetes   . Sepsis secondary to UTI ([place]) 05/02/2017    Past Surgical History:  Procedure Laterality Date  . COLONOSCOPY  02/17/2005  . CYSTO/  LEFT RETROGRADE PYELOGRAM/ STENT PLACEMENT  01/26/2005  . CYSTOSCOPY W/ URETEROSCOPY W/ LITHOTRIPSY Left  05/04/2005  . CYSTOSCOPY WITH STENT PLACEMENT Left 12/30/2015   Procedure: CYSTOSCOPY WITH STENT PLACEMENT;  Surgeon: Franchot Gallo, MD;  Location: [facility];  Service: Urology;  Laterality: Left;  . CYSTOSCOPY/RETROGRADE/URETEROSCOPY/STONE EXTRACTION WITH BASKET Left 12/30/2015   Procedure: CYSTOSCOPY/RETROGRADE/URETEROSCOPY/STONE EXTRACTION WITH BASKET;  Surgeon: Franchot Gallo, MD;  Location: [facility];  Service: Urology;  Laterality: Left;  . CYSTOSCOPY/URETEROSCOPY/HOLMIUM LASER/STENT PLACEMENT Left 03/09/2016   Procedure: CYSTOSCOPY/RETROGRADE PYELOGRAM/URETEROSCOPY/BASKET STONE EXTRACTION/STENT PLACEMENT;  Surgeon: Franchot Gallo, MD;  Location: [facility];  Service: Urology;  Laterality: Left;  . ESOPHAGOGASTRODUODENOSCOPY (EGD) WITH ESOPHAGEAL DILATION  x4  last one 07-21-2010  . HOLMIUM LASER APPLICATION Left 37/85/8850   Procedure: HOLMIUM LASER APPLICATION;  Surgeon: Franchot Gallo, MD;  Location: [facility];  Service: Urology;  Laterality: Left;  . INGUINAL HERNIA REPAIR Left yrs ago  . LUMBAR LAMINECTOMY/DECOMPRESSION MICRODISCECTOMY Left 11/09/2017   Procedure: Left Lumbar Two-Three Microdiscectomy;  Surgeon: Kristeen Miss, MD;  Location: [facility];  Service: Neurosurgery;  Laterality: Left;  Left Lumbar Two-Three Microdiscectomy  . NEPHROLITHOTOMY Left 02/01/2014   Procedure: NEPHROLITHOTOMY PERCUTANEOUS;  Surgeon: Jorja Loa, MD;  Location: [facility];  Service: Urology;  Laterality: Left;  . ORIF HIP FRACTURE Left 04/16/2017   Procedure: OPEN REDUCTION INTERNAL FIXATION HIP GREATER TROCHANTER;  Surgeon: Frederik Pear, MD;  Location: [facility];  Service: Orthopedics;  Laterality: Left;  . PARATHYROIDECTOMY Left 07/10/2014   Procedure: LEFT SUPERIOR PARATHYROIDECTOMY;  Surgeon:  Armandina Gemma, MD;  Location: [facility];  Service: General;  Laterality: Left;  . TOTAL HIP ARTHROPLASTY Left 06/24/2016   Procedure: LEFT TOTAL HIP ARTHROPLASTY  ANTERIOR APPROACH;  Surgeon: Gaynelle Arabian, MD;  Location: [facility];  Service: Orthopedics;  Laterality: Left;  . TOTAL HIP REVISION Left 11/18/2016   Procedure: Left femoral revision - posterior approach;  Surgeon: Gaynelle Arabian, MD;  Location: [facility];  Service: Orthopedics;  Laterality: Left;  . tumor ear Left age 68    topical growth behind left ear.    Family History  Problem Relation Age of Onset  . Heart disease Mother   . Alzheimer's disease Mother   . Heart disease Father   . Bladder Cancer Sister    Social History:  reports that he quit smoking about 44 years ago. He quit after 8.00 years of use. He has never used smokeless tobacco. He reports current alcohol use of about 1.0 - 2.0 standard drinks of alcohol per week. He reports that he does not use drugs.  Allergies:  Allergies  Allergen Reactions  . Iodinated Diagnostic Agents Shortness Of Breath  . Iohexol Shortness Of Breath    No medications prior to admission.    No results found for this or any previous visit (from the past 48 hour(s)). No results found.  Review of Systems  Constitutional: Negative.   HENT: Negative.   Eyes: Negative.  Negative for blurred vision.  Respiratory: Negative.   Cardiovascular: Negative.   Gastrointestinal: Negative.   Genitourinary: Negative.   Musculoskeletal: Positive for back pain.  Skin: Negative.   Neurological: Positive for sensory change and focal weakness. Negative for speech change.  Psychiatric/Behavioral: Negative.     There were no vitals taken for this visit. Physical Exam  Constitutional: He is oriented to person, place, and time. He appears well-developed and well-nourished.  HENT:  Head: Normocephalic and atraumatic.  Eyes: Pupils are equal, round, and reactive to light. Conjunctivae and EOM are normal.  Neck: Normal range of motion. Neck supple.  Respiratory: Effort normal and breath sounds normal.  GI: Soft. Bowel sounds are normal.  Musculoskeletal:      Comments: Straight leg raising bilaterally at 15 degrees.  Neurological: He is alert and oriented to person, place, and time.  Weakness in tibialis anterior graded at 2 out of 5 on the right side no weakness in the iliopsoas and quadriceps graded at 4 out of 5 in the left side absent patellar reflexes bilaterally absent Achilles reflexes bilaterally positive straight leg raising at 15 degrees Patrick's maneuver is negative bilaterally.  Psychiatric: He has a normal mood and affect. His behavior is normal. Judgment and thought content normal.     Assessment/Plan Lumbar spondylosis with herniated nucleus pulposus and stenosis L2-L3.  Lumbar radiculopathy, neurogenic claudication.  Plan: Decompression and fusion L2-L3.  Earleen Newport, MD 04/29/2018, 7:48 AM

## 2018-04-29 NOTE — Anesthesia Postprocedure Evaluation (Signed)
Anesthesia Post Note  Patient: [name] [name]  Procedure(s) Performed: Lumbar Two-Three Posterior lumbar interbody fusion (N/A Spine Lumbar)     Patient location during evaluation: PACU Anesthesia Type: General Level of consciousness: awake and alert, patient cooperative and oriented Pain management: pain level controlled Vital Signs Assessment: post-procedure vital signs reviewed and stable Respiratory status: spontaneous breathing, nonlabored ventilation and respiratory function stable Cardiovascular status: blood pressure returned to baseline and stable Postop Assessment: no apparent nausea or vomiting Anesthetic complications: no    Last Vitals:  Vitals:   04/29/18 2145 04/29/18 2200  BP: 138/76 134/79  Pulse: 70 66  Resp: 11 16  Temp:    SpO2: 96% 96%    Last Pain:  Vitals:   04/29/18 2200  TempSrc:   PainSc: 4                  Karli Wickizer,E. Remi Rester

## 2018-04-29 NOTE — Anesthesia Procedure Notes (Signed)
Procedure Name: Intubation Performed by: Milford Cage, CRNA Pre-anesthesia Checklist: Patient identified, Emergency Drugs available, Suction available and Patient being monitored Patient Re-evaluated:Patient Re-evaluated prior to induction Oxygen Delivery Method: Circle System Utilized Preoxygenation: Pre-oxygenation with 100% oxygen Induction Type: IV induction Ventilation: Oral airway inserted - appropriate to patient size and Two handed mask ventilation required Laryngoscope Size: Mac and 4 Grade View: Grade II Tube type: Oral Tube size: 7.5 mm Number of attempts: 1 Airway Equipment and Method: Stylet and Oral airway Placement Confirmation: ETT inserted through vocal cords under direct vision,  positive ETCO2 and breath sounds checked- equal and bilateral Secured at: 23 cm Tube secured with: Tape Dental Injury: Teeth and Oropharynx as per pre-operative assessment

## 2018-04-29 NOTE — Transfer of Care (Signed)
Immediate Anesthesia Transfer of Care Note  Patient: [name] [name]  Procedure(s) Performed: Lumbar Two-Three Posterior lumbar interbody fusion (N/A Spine Lumbar)  Patient Location: PACU  Anesthesia Type:General  Level of Consciousness: sedated, drowsy, patient cooperative and responds to stimulation  Airway & Oxygen Therapy: Patient Spontanous Breathing and Patient connected to nasal cannula oxygen  Post-op Assessment: Report given to RN, Post -op Vital signs reviewed and stable and Patient moving all extremities X 4  Post vital signs: Reviewed and stable  Last Vitals:  Vitals Value Taken Time  BP 139/81 04/29/2018  8:34 PM  Temp    Pulse 83 04/29/2018  8:33 PM  Resp 23 04/29/2018  8:33 PM  SpO2 96 % 04/29/2018  8:33 PM  Vitals shown include unvalidated device data.  Last Pain:  Vitals:   04/29/18 1530  TempSrc: Oral         Complications: No apparent anesthesia complications

## 2018-04-29 NOTE — Op Note (Signed)
Date of surgery: 04/29/2018 Preoperative diagnosis: Recurrent herniated nucleus pulposus L2-3 left with severe spinal stenosis and neurogenic claudication with radiculopathy. Postoperative diagnosis: Same Procedure: Bilateral laminotomies L1-L2 and decompression of common dural tube the L1 and the L2 nerve roots.  Posterior lumbar interbody arthrodesis L1-L2 with peek spacers local autograft allograft.  Posterior lateral arthrodesis with local autograft allograft and infuse L1-L2 pedicle screw fixation L1-L2. Surgeon: Kristeen Miss First Assistant: Kary Kos MD Anesthesia: General endotracheal Indications: [name] [name] is a 78 year old individual whose had significant back pain bilateral lower extremity pain worse initially on the right with weakness in the foot drop but then also on the left he initially described his pain to his hip replacement and 6 months ago was found to have a large herniated nucleus pulposus on the left he seemed to get better with left leg strength and right leg strength after initial discectomy however in the subsequent months his pain recurred and weakness got worse in that right lower extremity.  A myelogram was performed recently and this demonstrated presence of a large extruded fragment of disc again on the left side at L2-L3.  He had a complete myelographic block.  He was advised regarding the need for surgery to decompress and stabilize the joint as he had already had significant facetectomy to do his first decompression.  Procedure: The patient was brought to the operating room supine on the stretcher.  After the smooth induction of general endotracheal anesthesia he was carefully turned prone.  The back was prepped with alcohol DuraPrep and draped in a sterile fashion.  His previously made midline incision was reopened and the dissection was carried superiorly and inferiorly extending the incision by approximately 1-1/2 inches.  The lumbodorsal fascia was taken down first on  the left side then on the right side and a subperiosteal dissection was carried out to expose L2 and L3.  This vertebrae was marked positively on a radiograph.  Then laminotomy was created removing the inferior margin lamina of L to out to and including the entirety of the facet at L2-L3.  This was first done on the left side and then on the right side significant scar tissue from the previous dissection was released and gradually the common dural tube could be mobilized off a significant mass underneath it.  This was found to be recurrent disc material there was a substantial quantity of it from the region of the disc space on down behind the vertebral body of L3.  As this was decompressed the common dural tube was clearly having more room and the disc space was ultimately entered.  A significant quantity of residual disc material was removed from the space.  Similar procedure was carried out on the right side and the right side was decompressed just as well.  And then the interspace could be sized for appropriate size spacer is felt that an 8 mm tall 4 degree lordotic 23 mm long spacer would fit best into this interval to allow for good distraction and good alignment.  A total of 6 cc of autograft allograft and infuse was packed into the interspace along with the 2 cages.  Once the cages were set the lateral gutters were decompressed and decorticated an additional 9 cc a side of bone graft was packed into these lateral gutters.  Then pedicle entry sites were chosen at L2 and L3 and 6.5 x 45 mm pedicle screws were placed into L2 and L3.  Precontoured 40 mm rods were used to  connect the screws together in a neutral construct.  Once this was completed the retractor was removed the wound was inspected for hemostasis the common dural tube and the L1 and L2 nerve roots were inspected to make sure that there were adequately decompressed and no bone graft material had encumbered them and when this was verified the  lumbodorsal fascia was reapproximated with #1 Vicryl in interrupted fashion 2-0 Vicryl was used in the subcutaneous tissues and 3-0 Vicryl subcuticularly Dermabond was placed on the skin.  Patient tolerated procedure well was returned to recovery room in stable condition blood loss was estimated at 500 cc.

## 2018-04-29 NOTE — Progress Notes (Signed)
Patient ID: [name] [name], male   DOB: 1940/03/27, 78 y.o.   MRN: [id number] Vital signs are stable status post decompression L2-L3 from large herniated disc that was recurrent patient has a fusion at L2-3 and appears to be doing well notes his preoperative pain is better.  We will see how he does with ambulation over the next day or 2 discussed plan discharge for Monday but he may leave sooner if he feels able.

## 2018-04-29 NOTE — Anesthesia Preprocedure Evaluation (Signed)
Anesthesia Evaluation  Patient identified by MRN, date of birth, ID band Patient awake    Reviewed: Allergy & Precautions, NPO status , Patient's Chart, lab work & pertinent test results  Airway Mallampati: II  TM Distance: >3 FB Neck ROM: Full    Dental  (+) Teeth Intact, Dental Advisory Given   Pulmonary former smoker,    breath sounds clear to auscultation       Cardiovascular hypertension,  Rhythm:Regular Rate:Normal     Neuro/Psych    GI/Hepatic   Endo/Other    Renal/GU      Musculoskeletal   Abdominal   Peds  Hematology   Anesthesia Other Findings   Reproductive/Obstetrics                             Anesthesia Physical Anesthesia Plan  ASA: III  Anesthesia Plan: General   Post-op Pain Management:    Induction: Intravenous  PONV Risk Score and Plan: Ondansetron and Dexamethasone  Airway Management Planned: Oral ETT  Additional Equipment:   Intra-op Plan:   Post-operative Plan: Extubation in OR  Informed Consent: I have reviewed the patients History and Physical, chart, labs and discussed the procedure including the risks, benefits and alternatives for the proposed anesthesia with the patient or authorized representative who has indicated his/her understanding and acceptance.     Dental advisory given  Plan Discussed with: Anesthesiologist and CRNA  Anesthesia Plan Comments: (Hypertension Renal insufficiency Cr. 1.54 Prediabetes Obesity  Shari Heritage)        Anesthesia Quick Evaluation

## 2018-04-30 ENCOUNTER — Other Ambulatory Visit: Payer: Self-pay

## 2018-04-30 LAB — CBC
HCT: 32.8 % — ABNORMAL LOW (ref 39.0–52.0)
Hemoglobin: 10 g/dL — ABNORMAL LOW (ref 13.0–17.0)
MCH: 24.8 pg — ABNORMAL LOW (ref 26.0–34.0)
MCHC: 30.5 g/dL (ref 30.0–36.0)
MCV: 81.2 fL (ref 80.0–100.0)
Platelets: 303 10*3/uL (ref 150–400)
RBC: 4.04 MIL/uL — ABNORMAL LOW (ref 4.22–5.81)
RDW: 15.6 % — ABNORMAL HIGH (ref 11.5–15.5)
WBC: 13.8 10*3/uL — ABNORMAL HIGH (ref 4.0–10.5)
nRBC: 0 % (ref 0.0–0.2)

## 2018-04-30 LAB — BASIC METABOLIC PANEL
Anion gap: 8 (ref 5–15)
BUN: 21 mg/dL (ref 8–23)
CO2: 24 mmol/L (ref 22–32)
Calcium: 8.7 mg/dL — ABNORMAL LOW (ref 8.9–10.3)
Chloride: 102 mmol/L (ref 98–111)
Creatinine, Ser: 1.64 mg/dL — ABNORMAL HIGH (ref 0.61–1.24)
GFR calc Af Amer: 47 mL/min — ABNORMAL LOW (ref >60)
GFR calc non Af Amer: 40 mL/min — ABNORMAL LOW (ref >60)
Glucose, Bld: 155 mg/dL — ABNORMAL HIGH (ref 70–99)
Potassium: 4.3 mmol/L (ref 3.5–5.1)
Sodium: 134 mmol/L — ABNORMAL LOW (ref 135–145)

## 2018-04-30 NOTE — Evaluation (Signed)
Physical Therapy Evaluation Patient Details Name: [name] [name] MRN: [id number] DOB: 1940-03-05 Today's Date: 04/30/2018   History of Present Illness  Pt is a 78 y/o male s/p L2-L3 decompression and fusion. PMH including but not limited to HTN and multiple previous back surgeries.    Clinical Impression  Pt presented seated OOB in recliner chair, awake and willing to participate in therapy session. Prior to admission, pt reported that he ambulated with use of a cane at all times and was independent with ADLs. Pt lives with his wife in a single level home with a few steps to enter. He is currently at min guard to supervision level with all mobility including stair negotiation. PT discussed a generalized walking program for pt to initiate upon d/c home. PT also demonstrated and instructed pt in safe car transfers while maintaining back precautions. PT will continue to follow acutely to progress mobility as tolerated and to ensure a safe d/c home.    Follow Up Recommendations No PT follow up    Equipment Recommendations  None recommended by PT    Recommendations for Other Services       Precautions / Restrictions Precautions Precautions: Back Precaution Comments: pt able to recall 3/3 back precautions Required Braces or Orthoses: Spinal Brace Spinal Brace: Lumbar corset;Applied in sitting position Restrictions Weight Bearing Restrictions: No      Mobility  Bed Mobility               General bed mobility comments: pt OOB in recliner chair upon arrival  Transfers Overall transfer level: Needs assistance Equipment used: Rolling walker (2 wheeled) Transfers: Sit to/from Stand Sit to Stand: Supervision         General transfer comment: increased time, good technique, supervision for safety  Ambulation/Gait Ambulation/Gait assistance: Min guard Gait Distance (Feet): 200 Feet Assistive device: Rolling walker (2 wheeled) Gait Pattern/deviations: Step-through  pattern;Decreased stride length Gait velocity: decreased   General Gait Details: pt with slow, cautious gait, no LOB or need for physical assistance, min guard for safety  Stairs Stairs: Yes Stairs assistance: Supervision Stair Management: Two rails;Step to pattern;Forwards Number of Stairs: 2(x2 bouts)    Wheelchair Mobility    Modified Rankin (Stroke Patients Only)       Balance Overall balance assessment: Needs assistance Sitting-balance support: Feet supported Sitting balance-Leahy Scale: Good     Standing balance support: During functional activity;Bilateral upper extremity supported Standing balance-Leahy Scale: Poor                               Pertinent Vitals/Pain Pain Assessment: 0-10 Pain Score: 7  Pain Location: back, incision site Pain Descriptors / Indicators: Sore Pain Intervention(s): Monitored during session;Repositioned    Home Living Family/patient expects to be discharged to:: Private residence Living Arrangements: Spouse/significant other Available Help at Discharge: Family Type of Home: House Home Access: Stairs to enter Entrance Stairs-Rails: Left Entrance Stairs-Number of Steps: 4 Home Layout: One level Home Equipment: Environmental consultant - 2 wheels;Cane - single point      Prior Function Level of Independence: Independent with assistive device(s)         Comments: ambulates with a cane     Hand Dominance        Extremity/Trunk Assessment   Upper Extremity Assessment Upper Extremity Assessment: Defer to OT evaluation;Overall [facility] for tasks assessed    Lower Extremity Assessment Lower Extremity Assessment: Overall WFL for tasks assessed  Cervical / Trunk Assessment Cervical / Trunk Assessment: Other exceptions Cervical / Trunk Exceptions: s/p lumbar sx  Communication   Communication: No difficulties  Cognition Arousal/Alertness: Awake/alert Behavior During Therapy: WFL for tasks assessed/performed Overall Cognitive  Status: Within Functional Limits for tasks assessed                                        General Comments      Exercises     Assessment/Plan    PT Assessment Patient needs continued PT services  PT Problem List Decreased balance;Decreased mobility;Decreased coordination;Pain       PT Treatment Interventions DME instruction;Gait training;Stair training;Functional mobility training;Therapeutic activities;Therapeutic exercise;Balance training;Neuromuscular re-education;Patient/family education    PT Goals (Current goals can be found in the Care Plan section)  Acute Rehab PT Goals Patient Stated Goal: decrease pain, return home tomorrow PT Goal Formulation: With patient Time For Goal Achievement: 05/07/18 Potential to Achieve Goals: Good    Frequency Min 5X/week   Barriers to discharge        Co-evaluation               AM-PAC PT "6 Clicks" Mobility  Outcome Measure Help needed turning from your back to your side while in a flat bed without using bedrails?: None Help needed moving from lying on your back to sitting on the side of a flat bed without using bedrails?: None Help needed moving to and from a bed to a chair (including a wheelchair)?: None Help needed standing up from a chair using your arms (e.g., wheelchair or bedside chair)?: None Help needed to walk in hospital room?: None Help needed climbing 3-5 steps with a railing? : None 6 Click Score: 24    End of Session Equipment Utilized During Treatment: Back brace Activity Tolerance: Patient tolerated treatment well Patient left: in chair;with call bell/phone within reach Nurse Communication: Mobility status PT Visit Diagnosis: Other abnormalities of gait and mobility (R26.89);Pain Pain - part of body: (back)    Time: 1610-9604 PT Time Calculation (min) (ACUTE ONLY): 24 min   Charges:   PT Evaluation $PT Eval Moderate Complexity: 1 Mod PT Treatments $Gait Training: 8-22 mins         Sherie Don, PT, DPT  Acute Rehabilitation Services Pager [phone] Office [place] 04/30/2018, 1:20 PM

## 2018-04-30 NOTE — Progress Notes (Signed)
Orthopedic Tech Progress Note Patient Details:  [name] [name] 1940/06/03 [id number] RN said patient has brace already Patient ID: [name] [name], male   DOB: [birth year], 78 y.o.   MRN: [id number]   [name] [name] 04/30/2018, 8:41 AM

## 2018-04-30 NOTE — Evaluation (Signed)
Occupational Therapy Evaluation Patient Details Name: [name] [name] MRN: [id number] DOB: [birth year] Today's Date: 04/30/2018    History of Present Illness Pt is a 78 y/o male s/p L2-L3 decompression and fusion. PMH including but not limited to HTN and multiple previous back surgeries.   Clinical Impression   Patient is s/p s/p L2-L3 decompression and fusion surgery resulting in the deficits listed below (see OT Problem List). Patient reported he was independent prior to sx in ADLs and using a cane. The patient lives with his wife who has limitations in lifting items. The patient will have his daughter to assist for one week with the transition home. The patient is able to report 2/3 precautions and was able to don/doff brace. The patient reported he he has shower chair, 3 in 1 commode, cane and reacher at home.  Patient will benefit from skilled acute  OT to increase their safety, independence with ADL with LE ADLs and functional mobility for ADL (while adhering to their precautions) to facilitate discharge to home with family support.       Follow Up Recommendations  No OT follow up;Supervision/Assistance - 24 hour    Equipment Recommendations       Recommendations for Other Services       Precautions / Restrictions Precautions Precautions: Back Precaution Booklet Issued: Yes (comment) Precaution Comments: able to report 2/3 precautions Required Braces or Orthoses: Spinal Brace Spinal Brace: Lumbar corset;Applied in sitting position Restrictions Weight Bearing Restrictions: No      Mobility Bed Mobility               General bed mobility comments: pt OOB in recliner chair upon arrival  Transfers Overall transfer level: Needs assistance Equipment used: Rolling walker (2 wheeled) Transfers: Sit to/from Stand Sit to Stand: Supervision         General transfer comment: increased time, good technique, supervision for safety    Balance Overall balance assessment:  Needs assistance Sitting-balance support: Feet supported Sitting balance-Leahy Scale: Good     Standing balance support: During functional activity;Bilateral upper extremity supported Standing balance-Leahy Scale: Fair                             ADL either performed or assessed with clinical judgement   ADL Overall ADL's : Needs assistance/impaired Eating/Feeding: Independent;Sitting   Grooming: Wash/dry hands;Supervision/safety;Cueing for safety;Cueing for sequencing;Standing   Upper Body Bathing: Modified independent;Sitting   Lower Body Bathing: Minimal assistance;Cueing for compensatory techniques;Cueing for back precautions;Adhering to back precautions;Sit to/from stand   Upper Body Dressing : Modified independent;Sitting   Lower Body Dressing: Minimal assistance;With adaptive equipment;Cueing for back precautions;Sit to/from stand   Toilet Transfer: Supervision/safety;Ambulation;Comfort height toilet;Cueing for safety;Cueing for sequencing   Toileting- Clothing Manipulation and Hygiene: Modified independent   Tub/ Shower Transfer: Min guard;Adhering to back precautions;Cueing for safety;Cueing for sequencing   Functional mobility during ADLs: Min guard;Rolling walker       Vision Baseline Vision/History: Wears glasses Patient Visual Report: No change from baseline Vision Assessment?: No apparent visual deficits     Perception Perception Perception Tested?: No   Praxis Praxis Praxis tested?: Not tested    Pertinent Vitals/Pain Pain Assessment: Faces Pain Score: 7  Faces Pain Scale: Hurts a little bit Pain Location: back, incision site Pain Descriptors / Indicators: Sore Pain Intervention(s): Monitored during session;Repositioned;Limited activity within patient's tolerance     Hand Dominance Right   Extremity/Trunk Assessment Upper Extremity Assessment Upper  Extremity Assessment: Overall WFL for tasks assessed   Lower Extremity  Assessment Lower Extremity Assessment: Defer to PT evaluation   Cervical / Trunk Assessment Cervical / Trunk Assessment: Other exceptions Cervical / Trunk Exceptions: s/p lumbar sx   Communication Communication Communication: No difficulties   Cognition Arousal/Alertness: Awake/alert Behavior During Therapy: WFL for tasks assessed/performed Overall Cognitive Status: Within Functional Limits for tasks assessed                                     General Comments       Exercises     Shoulder Instructions      Home Living Family/patient expects to be discharged to:: Private residence Living Arrangements: Spouse/significant other Available Help at Discharge: Family Type of Home: House Home Access: Stairs to enter Technical brewer of Steps: 4 Entrance Stairs-Rails: Left Home Layout: One level     Bathroom Shower/Tub: Occupational psychologist: Handicapped height Bathroom Accessibility: Yes How Accessible: Accessible via walker Home Equipment: [place] - 2 wheels;Cane - single point;Shower seat;Bedside commode;Grab bars - tub/shower;Adaptive equipment Adaptive Equipment: Reacher;Long-handled sponge        Prior Functioning/Environment Level of Independence: Independent with assistive device(s)        Comments: ambulates with a cane        OT Problem List: Decreased activity tolerance;Decreased range of motion;Decreased safety awareness;Decreased knowledge of use of DME or AE;Decreased knowledge of precautions;Pain      OT Treatment/Interventions: Self-care/ADL training;DME and/or AE instruction;Therapeutic activities;Patient/family education;Balance training    OT Goals(Current goals can be found in the care plan section) Acute Rehab OT Goals Patient Stated Goal: to eat soon, and move more OT Goal Formulation: With patient Time For Goal Achievement: 05/07/18 Potential to Achieve Goals: Good  OT Frequency: Min 2X/week   Barriers to  D/C:            Co-evaluation              AM-PAC OT "6 Clicks" Daily Activity     Outcome Measure Help from another person eating meals?: None Help from another person taking care of personal grooming?: None Help from another person toileting, which includes using toliet, bedpan, or urinal?: A Little Help from another person bathing (including washing, rinsing, drying)?: A Little Help from another person to put on and taking off regular upper body clothing?: None Help from another person to put on and taking off regular lower body clothing?: A Little 6 Click Score: 21   End of Session Equipment Utilized During Treatment: Gait belt;Rolling walker;Back brace  Activity Tolerance: Patient tolerated treatment well;Patient limited by pain;Patient limited by fatigue Patient left: in chair;with call bell/phone within reach;with family/visitor present  OT Visit Diagnosis: Unsteadiness on feet (R26.81);Pain;Muscle weakness (generalized) (M62.81) Pain - part of body: (back)                Time: 8563-1497 OT Time Calculation (min): 58 min Charges:  OT General Charges $OT Visit: 1 Visit OT Evaluation $OT Eval Low Complexity: 1 Low OT Treatments $Self Care/Home Management : 38-52 mins  Joeseph Amor OTR/L  Acute Rehab Services  [phone] office number [phone] pager number   Joeseph Amor 04/30/2018, 2:26 PM

## 2018-04-30 NOTE — Progress Notes (Signed)
CSW acknowledges social work consult. Spoke with PT, they are not recommending any PT follow up for the patient. The patient is able to ambulate by himself with minimal assistance and has all necessary equipment at home.   No further needs from social work at this time.   CSW signing off.   Domenic Schwab, MSW, [place]

## 2018-05-01 NOTE — Progress Notes (Signed)
Overnight, pt ambulated in hall with mild discomfort. Surgical site clean, dry, with approximated edges. Voiding at this time. LR d/c'd d/t pt eating, drinking, tolerating po meds.Pt refused colace, senna po tabs because he wants to take his "magnesium" at home and "it always works". Pain controlled with 1 tab po oxy. Will continue to monitor.

## 2018-05-01 NOTE — Progress Notes (Signed)
Physical Therapy Treatment Patient Details Name: [name] [name] MRN: [id number] DOB: 04-10-1940 Today's Date: 05/01/2018    History of Present Illness Pt is a 78 y/o male s/p L2-L3 decompression and fusion. PMH including but not limited to HTN and multiple previous back surgeries.    PT Comments    Making good progress.  Needs frequent cues for posture. Noted anticipated DC home tomorrow AM.  I have encouraged the patient to gradually increase activity daily to tolerance.     Follow Up Recommendations  No PT follow up;Supervision - Intermittent     Equipment Recommendations  None recommended by PT    Recommendations for Other Services       Precautions / Restrictions Precautions Precautions: Back Precaution Booklet Issued: Yes (comment) Precaution Comments: able to report 2/3 precautions(Did not recall no lifting) Required Braces or Orthoses: Spinal Brace Spinal Brace: Lumbar corset;Applied in sitting position Restrictions Weight Bearing Restrictions: No    Mobility  Bed Mobility Overal bed mobility: Needs Assistance Bed Mobility: Sit to Supine       Sit to supine: Min assist   General bed mobility comments: Was OOB at start of session, required MIna for lower extremities when getting back in bed  Transfers Overall transfer level: Needs assistance Equipment used: Rolling walker (2 wheeled) Transfers: Sit to/from Stand Sit to Stand: Supervision         General transfer comment: increased time, good technique  Ambulation/Gait Ambulation/Gait assistance: Supervision Gait Distance (Feet): 250 Feet Assistive device: Rolling walker (2 wheeled) Gait Pattern/deviations: Step-through pattern;Decreased stride length     General Gait Details: Frequent verbal or tactile cues for upright posture   Stairs Stairs: Yes Stairs assistance: Supervision Stair Management: Two rails;Step to pattern;Forwards Number of Stairs: 2     Wheelchair Mobility    Modified  Rankin (Stroke Patients Only)       Balance                                            Cognition Arousal/Alertness: Awake/alert Behavior During Therapy: WFL for tasks assessed/performed Overall Cognitive Status: Within Functional Limits for tasks assessed                                        Exercises      General Comments General comments (skin integrity, edema, etc.): No family present. Pt reports he will DC in AM      Pertinent Vitals/Pain Pain Assessment: 0-10 Pain Score: 6  Pain Location: back Pain Descriptors / Indicators: Sore Pain Intervention(s): Limited activity within patient's tolerance;Monitored during session    Home Living                      Prior Function            PT Goals (current goals can now be found in the care plan section) Progress towards PT goals: Progressing toward goals    Frequency    Min 5X/week      PT Plan Current plan remains appropriate    Co-evaluation              AM-PAC PT "6 Clicks" Mobility   Outcome Measure  Help needed turning from your back to your side while in a flat bed without  using bedrails?: None Help needed moving from lying on your back to sitting on the side of a flat bed without using bedrails?: None Help needed moving to and from a bed to a chair (including a wheelchair)?: A Little Help needed standing up from a chair using your arms (e.g., wheelchair or bedside chair)?: A Little Help needed to walk in hospital room?: A Little Help needed climbing 3-5 steps with a railing? : A Little 6 Click Score: 20    End of Session Equipment Utilized During Treatment: Back brace Activity Tolerance: Patient tolerated treatment well Patient left: in bed;with bed alarm set;with call bell/phone within reach   PT Visit Diagnosis: Other abnormalities of gait and mobility (R26.89)     Time: 7829-5621 PT Time Calculation (min) (ACUTE ONLY): 24 min  Charges:   $Gait Training: 23-37 mins                     Lavonia Dana, [place]  Pager [phone] Office [phone] 05/01/2018    Melvern Banker 05/01/2018, 12:30 PM

## 2018-05-01 NOTE — Progress Notes (Signed)
NEUROSURGERY PROGRESS NOTE  Doing well. Complains of appropriate back soreness. No leg pain No numbness, tingling or weakness Ambulating and voiding well Good strength and sensation Incision CDI  Temp:  [98 F (36.7 C)-99.2 F (37.3 C)] 99.2 F (37.3 C) (03/01 0310) Pulse Rate:  [68-96] 79 (03/01 0310) Resp:  [16-18] 18 (03/01 0310) BP: (106-118)/(64-75) 110/75 (03/01 0310) SpO2:  [96 %-100 %] 97 % (03/01 0310)  Plan: Patient's daughter will be coming in town tomorrow to help him at home. He does not have much assistance at home. Will plan to discharge tomorrow for more supervision at home.   Eleonore Chiquito, NP 05/01/2018 8:18 AM

## 2018-05-02 MED ORDER — METHOCARBAMOL 500 MG PO TABS: 500.0000 mg | ORAL_TABLET | Freq: Four times a day (QID) | ORAL | 2 refills | Status: DC | PRN

## 2018-05-02 MED ORDER — OXYCODONE-ACETAMINOPHEN 5-325 MG PO TABS: 1.0000 | ORAL_TABLET | ORAL | 0 refills | Status: DC | PRN

## 2018-05-02 NOTE — Progress Notes (Signed)
Occupational Therapy Treatment Patient Details Name: [name] [name] MRN: [id number] DOB: 20-May-1940 Today's Date: 05/02/2018    History of present illness Pt is a 78 y/o male s/p L2-L3 decompression and fusion. PMH including but not limited to HTN and multiple previous back surgeries.   OT comments  Patient seated in recliner and agreeable to OT.  Demonstrates good understanding of LB dressing techniques using AE, min assist required for shoe horn only.  Min cueing throughout session for posture, but good recall of precautions (verbalizing 3/3).  Plans to dc home today, will follow.    Follow Up Recommendations  No OT follow up;Supervision - Intermittent    Equipment Recommendations  None recommended by OT    Recommendations for Other Services      Precautions / Restrictions Precautions Precautions: Back Precaution Booklet Issued: Yes (comment) Precaution Comments: Able to recall 3/3 Required Braces or Orthoses: Spinal Brace Spinal Brace: Lumbar corset;Applied in sitting position Restrictions Weight Bearing Restrictions: No       Mobility Bed Mobility               General bed mobility comments: OOB in chair   Transfers Overall transfer level: Needs assistance Equipment used: Rolling walker (2 wheeled) Transfers: Sit to/from Stand Sit to Stand: Supervision         General transfer comment: Supervision for safety. Stood from Automotive engineer. Increased time, good technique.    Balance Overall balance assessment: Needs assistance Sitting-balance support: Feet supported;No upper extremity supported Sitting balance-Leahy Scale: Good     Standing balance support: During functional activity;Bilateral upper extremity supported Standing balance-Leahy Scale: Fair Standing balance comment: Able to stand and adjust brace in standing without difficulty.                            ADL either performed or assessed with clinical judgement   ADL Overall ADL's :  Needs assistance/impaired     Grooming: Standing           Upper Body Dressing : Modified independent;Sitting   Lower Body Dressing: Sit to/from stand;With adaptive equipment;Minimal assistance Lower Body Dressing Details (indicate cue type and reason): min assist using AE to shoes, using reacher to don pants/ doff socks without assist  Toilet Transfer: Supervision/safety;Ambulation;RW Toilet Transfer Details (indicate cue type and reason): cueing for posture     Tub/ Shower Transfer: Walk-in shower;Supervision/safety;Ambulation;Rolling walker;Shower seat   Functional mobility during ADLs: Supervision/safety;Rolling walker General ADL Comments: pt completing all mobility with supervision, cueing at times for upright posture and safety      Vision       Perception     Praxis      Cognition Arousal/Alertness: Awake/alert Behavior During Therapy: WFL for tasks assessed/performed Overall Cognitive Status: Within Functional Limits for tasks assessed                                          Exercises     Shoulder Instructions       General Comments      Pertinent Vitals/ Pain       Pain Assessment: 0-10 Pain Score: 4  Pain Location: back Pain Descriptors / Indicators: Sore;Operative site guarding Pain Intervention(s): Monitored during session;Repositioned  Home Living  Prior Functioning/Environment              Frequency  Min 2X/week        Progress Toward Goals  OT Goals(current goals can now be found in the care plan section)  Progress towards OT goals: Progressing toward goals  Acute Rehab OT Goals Patient Stated Goal: home today OT Goal Formulation: With patient Time For Goal Achievement: 05/07/18 Potential to Achieve Goals: Good  Plan Discharge plan remains appropriate;Frequency remains appropriate    Co-evaluation                 AM-PAC OT "6 Clicks"  Daily Activity     Outcome Measure   Help from another person eating meals?: None Help from another person taking care of personal grooming?: None Help from another person toileting, which includes using toliet, bedpan, or urinal?: None Help from another person bathing (including washing, rinsing, drying)?: None Help from another person to put on and taking off regular upper body clothing?: None Help from another person to put on and taking off regular lower body clothing?: A Little 6 Click Score: 23    End of Session Equipment Utilized During Treatment: Rolling walker;Back brace  OT Visit Diagnosis: Unsteadiness on feet (R26.81);Pain;Muscle weakness (generalized) (M62.81) Pain - part of body: (back)   Activity Tolerance Patient tolerated treatment well   Patient Left in chair;with call bell/phone within reach   Nurse Communication          Time: 9604-5409 OT Time Calculation (min): 26 min  Charges: OT General Charges $OT Visit: 1 Visit OT Treatments $Self Care/Home Management : 23-37 mins  Delight Stare, [place] Pager [phone] Office [phone]    Delight Stare 05/02/2018, 12:59 PM

## 2018-05-02 NOTE — Discharge Summary (Signed)
Physician Discharge Summary  Patient ID: [name] [name] MRN: [id number] DOB/AGE: September 18, 1940 78 y.o.  Admit date: 04/29/2018 Discharge date: 05/02/2018  Admission Diagnoses: Recurrent herniated nucleus pulposus L2-L3 with lumbar radiculopathy, neurogenic claudication   Discharge Diagnoses: Recurrent herniated nucleus pulposus L2-L3 with lumbar radiculopathy, neurogenic claudication.  Right foot drop. Active Problems:   Herniated nucleus pulposus, L2-3 left   Discharged Condition: good  Hospital Course: Patient was admitted to undergo surgical decompression and stabilization at L2-L3 which he tolerated well.  Consults: None  Significant Diagnostic Studies: None  Treatments: surgery: Bilateral laminectomy L2-L3 decompression of L2 and L3 posterior lumbar interbody arthrodesis with peek spacers local autograft and allograft L2-L3  Discharge Exam: Blood pressure (!) 97/49, pulse 76, temperature 98.2 F (36.8 C), temperature source Oral, resp. rate 20, height 5\' 11"  (1.803 m), weight 117.2 kg, SpO2 98 %. Incision is clean and dry motor function is intact Station and gait are intact foot drop is markedly improved and right lower extremity  Disposition: Discharge disposition: 01-Home or Self Care       Discharge Instructions    Call MD for:  redness, tenderness, or signs of infection (pain, swelling, redness, odor or green/yellow discharge around incision site)   Complete by:  As directed    Call MD for:  severe uncontrolled pain   Complete by:  As directed    Call MD for:  temperature >100.4   Complete by:  As directed    Diet - low sodium heart healthy   Complete by:  As directed    Discharge instructions   Complete by:  As directed    Okay to shower. Do not apply salves or appointments to incision. No heavy lifting with the upper extremities greater than 15 pounds. May resume driving when not requiring pain medication and patient feels comfortable with doing so.   Incentive  spirometry RT   Complete by:  As directed    Increase activity slowly   Complete by:  As directed      Allergies as of 05/02/2018      Reactions   Iodinated Diagnostic Agents Shortness Of Breath   Iohexol Shortness Of Breath      Medication List    TAKE these medications   aspirin EC 81 MG tablet Take 81 mg by mouth daily.   b complex vitamins tablet Take 1 tablet by mouth daily.   bisoprolol-hydrochlorothiazide 5-6.25 MG tablet Commonly known as:  ZIAC TAKE 1 TABLET BY MOUTH EVERY MORNING FOR BLOOD PRESSURE What changed:  See the new instructions.   fenofibrate 145 MG tablet Commonly known as:  TRICOR TAKE 1 TABLET BY MOUTH DAILY FOR TRIGLYCERIDES/BLOOD FATS What changed:  See the new instructions.   finasteride 5 MG tablet Commonly known as:  PROSCAR Take 5 mg by mouth daily.   Fish Oil 1200 MG Caps Take 1,200 mg by mouth daily.   FLAX SEEDS PO Take 1,000 mg by mouth daily.   HYDROcodone-acetaminophen 5-325 MG tablet Commonly known as:  NORCO/VICODIN Take 1 tablet by mouth every 6 (six) hours as needed for pain.   losartan 100 MG tablet Commonly known as:  COZAAR Take 1 tablet daily for BP What changed:    how much to take  how to take this  when to take this   Magnesium Oxide 250 MG Tabs Take 250 mg by mouth daily.   methocarbamol 500 MG tablet Commonly known as:  ROBAXIN Take 1 tablet (500 mg total) by mouth every 6 (  six) hours as needed for muscle spasms.   oxyCODONE-acetaminophen 5-325 MG tablet Commonly known as:  PERCOCET/ROXICET Take 1-2 tablets by mouth every 3 (three) hours as needed for moderate pain or severe pain.   pantoprazole 40 MG tablet Commonly known as:  PROTONIX TAKE 1 TABLET BY MOUTH DAILY.   tamsulosin 0.4 MG Caps capsule Commonly known as:  FLOMAX Take 0.4 mg by mouth daily after breakfast.   vitamin B-12 1000 MCG tablet Commonly known as:  CYANOCOBALAMIN Take 1 tablet (1,000 mcg total) by mouth daily.   VITAMIN D  PO Take 1 tablet by mouth 2 (two) times daily.        Signed: Earleen Newport 05/02/2018, 12:39 PM

## 2018-05-02 NOTE — Progress Notes (Addendum)
Physical Therapy Treatment Patient Details Name: [name] [name] MRN: [id number] DOB: Mar 20, 1940 Today's Date: 05/02/2018    History of Present Illness Pt is a 78 y/o male s/p L2-L3 decompression and fusion. PMH including but not limited to HTN and multiple previous back surgeries.    PT Comments    Patient progressing well towards PT goals. Assisted pt to bathroom in early AM due to wanting to have a BM and finished later in morning for rest of session. Tolerated gait training and stair training today with supervision for safety. Requires cues for upright posture throughout and 2/4 DOE with mobility. VSS. Able to recall 2/3 back precautions. Reviewed positioning and to limit sitting as well as exercise recommendations for home. Educated pt on how friend can assist with stair training to enter home. Pt eager to return home and get back to PLOF. Will follow.   Follow Up Recommendations  No PT follow up;Supervision - Intermittent     Equipment Recommendations  None recommended by PT    Recommendations for Other Services       Precautions / Restrictions Precautions Precautions: Back Precaution Booklet Issued: Yes (comment) Precaution Comments: Able to recall 2/3, forgot lifting. Required Braces or Orthoses: Spinal Brace Spinal Brace: Lumbar corset;Applied in sitting position Restrictions Weight Bearing Restrictions: No    Mobility  Bed Mobility               General bed mobility comments: Up in chair upon PT arrival.   Transfers Overall transfer level: Needs assistance Equipment used: Rolling walker (2 wheeled) Transfers: Sit to/from Stand Sit to Stand: Supervision         General transfer comment: Supervision for safety. Stood from Automotive engineer. Increased time, good technique.  Ambulation/Gait Ambulation/Gait assistance: Supervision Gait Distance (Feet): 275 Feet Assistive device: Rolling walker (2 wheeled) Gait Pattern/deviations: Step-through pattern;Decreased  stride length;Trunk flexed Gait velocity: decreased   General Gait Details: Cues for upright posture throughout; 2/4 DOE. 1 standing rest break.    Stairs Stairs: Yes Stairs assistance: Supervision Stair Management: Two rails;Step to pattern;Forwards Number of Stairs: 2 General stair comments: Cues for safety.    Wheelchair Mobility    Modified Rankin (Stroke Patients Only)       Balance Overall balance assessment: Needs assistance Sitting-balance support: Feet supported;No upper extremity supported Sitting balance-Leahy Scale: Good     Standing balance support: During functional activity Standing balance-Leahy Scale: Fair Standing balance comment: Able to stand and adjust brace in standing without difficulty.                             Cognition Arousal/Alertness: Awake/alert Behavior During Therapy: WFL for tasks assessed/performed Overall Cognitive Status: Within Functional Limits for tasks assessed                                        Exercises      General Comments        Pertinent Vitals/Pain Pain Assessment: 0-10 Pain Score: 6  Pain Location: back Pain Descriptors / Indicators: Sore;Operative site guarding Pain Intervention(s): Monitored during session;Repositioned;Limited activity within patient's tolerance    Home Living                      Prior Function            PT Goals (current  goals can now be found in the care plan section) Progress towards PT goals: Progressing toward goals    Frequency    Min 5X/week      PT Plan Current plan remains appropriate    Co-evaluation              AM-PAC PT "6 Clicks" Mobility   Outcome Measure  Help needed turning from your back to your side while in a flat bed without using bedrails?: None Help needed moving from lying on your back to sitting on the side of a flat bed without using bedrails?: None Help needed moving to and from a bed to a chair  (including a wheelchair)?: A Little Help needed standing up from a chair using your arms (e.g., wheelchair or bedside chair)?: A Little Help needed to walk in hospital room?: A Little Help needed climbing 3-5 steps with a railing? : A Little 6 Click Score: 20    End of Session Equipment Utilized During Treatment: Back brace Activity Tolerance: Patient tolerated treatment well Patient left: in chair;with call bell/phone within reach Nurse Communication: Mobility status PT Visit Diagnosis: Other abnormalities of gait and mobility (R26.89) Pain - part of body: (back)     Time: [phone] PT Time Calculation (min) (ACUTE ONLY): 19 min  Charges:  $Gait Training: 8-22 mins $Therapeutic Activity: 8-22 mins                     Wray Kearns, PT, DPT Acute Rehabilitation Services Pager [phone] Office [phone]       Marguarite Arbour A Sabra Heck 05/02/2018, 11:17 AM

## 2018-05-02 NOTE — Progress Notes (Signed)
Pt d/c from unit via [facility] with volunteer services, all lines removed, d/c instructions given. Arlis Porta

## 2018-05-04 ENCOUNTER — Telehealth: Payer: Self-pay | Admitting: Gastroenterology

## 2018-05-04 NOTE — Telephone Encounter (Signed)
Spoke with pts wife and she is aware. 

## 2018-05-04 NOTE — Telephone Encounter (Signed)
Previous imaging has been reviewed from 2018 esophagram and Dr. Silverio Decamp records reviewed. Query if this could be dysmotility based on his symptoms and prior workup. I would for the next week proceed with double dosing of his PPI. If he is tolerating his secretions then no urgency to consider repeat EGD (as he has been non-desiring of procedures in past). Let's plan to get him back into clinic with one of the Pas in the next 1-2 weeks if symptoms are persisting and he is otherwise stable with food stuffs going down but just slowly. I would not repeat esophagram at this time until re-evaluated in clinic. Please let us know if symptoms are much more symptomatic. Thanks. GM

## 2018-05-04 NOTE — Telephone Encounter (Signed)
Pts wife states pt has been tested to see if he needs any procedures done to stretch his throat and he has been told he does not need anything done. Pt is taking protonix daily. States pt had back surgery last week and he is having trouble getting the food to go down. Reports he is chewing his food really well and drinking liquids in between bites. She is asking if we have any other recommendations. Dr. Rush Landmark I believe you are doc of the day this afternoon as Dr. Silverio Decamp is out. Please advise.

## 2018-05-11 ENCOUNTER — Telehealth: Payer: Self-pay | Admitting: Gastroenterology

## 2018-05-11 NOTE — Telephone Encounter (Signed)
Last spoke with the patient in Sept 2019. At that time he wanted Protonix because he thought it was helping him with his "getting his food down." Patient calls today because he has started having issues again. He states he has problems with solids like chicken or rice. If he does not stand up to swallow his medications which are mostly capsules, he feels them stick also. Once food becomes stuck, he cannot get liquids down either. He said "I guess I should stand for all my meals." He just had surgery and says he cannot come to see [place] just yet. He will see his neurosurgeon soon. Agrees to let us know when he can come in. Please review and advise.

## 2018-05-11 NOTE — Telephone Encounter (Signed)
He has history of esophageal stricture, status post dilation in 2012.  His symptoms are worrisome, especially solid dysphagia.  Will need EGD with possible esophageal dilation soon, please bring him in for an office visit with app to schedule it.  In the interim please advise patient to avoid tough/dry meat, bread, chunky vegetables.  Try to cut the tablets or switch to different formulary if he is unable to get the capsules down.  Chew food really well and eats slowly.

## 2018-05-11 NOTE — Telephone Encounter (Signed)
Patient is aware of the recommendations. He will call back when he is able to schedule. He will follow closely the recommendations. Thanks me for the call.

## 2018-05-13 DIAGNOSIS — M48062 Spinal stenosis, lumbar region with neurogenic claudication: Secondary | ICD-10-CM | POA: Diagnosis not present

## 2018-05-17 ENCOUNTER — Telehealth: Payer: Self-pay | Admitting: Gastroenterology

## 2018-05-17 ENCOUNTER — Other Ambulatory Visit: Payer: Self-pay | Admitting: Gastroenterology

## 2018-05-17 MED ORDER — PANTOPRAZOLE SODIUM 40 MG PO TBEC: 40.0000 mg | DELAYED_RELEASE_TABLET | Freq: Two times a day (BID) | ORAL | 6 refills | Status: DC

## 2018-05-17 NOTE — Telephone Encounter (Signed)
patient was told to take twice daily protonix but protonix twice daily was never sent in  , I refilled the once daily dosage of protonix from an electronic refill request yesterday, I will submit a new prescription for twice a day and hopefully the pharmacy will refill the supplement amount

## 2018-05-17 NOTE — Telephone Encounter (Signed)
Pt wife called and needing advice on the dosage for the medication for her husband

## 2018-06-21 DIAGNOSIS — M48062 Spinal stenosis, lumbar region with neurogenic claudication: Secondary | ICD-10-CM | POA: Diagnosis not present

## 2018-07-13 ENCOUNTER — Telehealth: Payer: Medicare Other

## 2018-07-13 DIAGNOSIS — M79642 Pain in left hand: Secondary | ICD-10-CM | POA: Diagnosis not present

## 2018-07-13 DIAGNOSIS — L089 Local infection of the skin and subcutaneous tissue, unspecified: Secondary | ICD-10-CM

## 2018-07-13 DIAGNOSIS — W57XXXA Bitten or stung by nonvenomous insect and other nonvenomous arthropods, initial encounter: Secondary | ICD-10-CM | POA: Diagnosis not present

## 2018-07-13 DIAGNOSIS — S60562A Insect bite (nonvenomous) of left hand, initial encounter: Secondary | ICD-10-CM | POA: Diagnosis not present

## 2018-07-13 NOTE — Telephone Encounter (Signed)
Patient has a real bad pain on left hand. Knows some firefighter and they looked at it and think he was bitten by something.  Patient wants to know if there is something he can take for something like that. Does not want to come to the office.

## 2018-07-14 ENCOUNTER — Ambulatory Visit: Payer: Medicare Other | Admitting: Physician Assistant

## 2018-07-14 MED ORDER — DOXYCYCLINE HYCLATE 100 MG PO CAPS: ORAL_CAPSULE | ORAL | 0 refills | Status: DC

## 2018-07-14 MED ORDER — PREDNISONE 20 MG PO TABS: ORAL_TABLET | ORAL | 0 refills | Status: DC

## 2018-07-14 NOTE — Addendum Note (Signed)
Addended by: Izora Ribas on: 07/14/2018 10:25 AM   Modules accepted: Orders

## 2018-07-14 NOTE — Telephone Encounter (Signed)
Virtual Visit via Telephone Note  I connected with [name] [name] on 07/14/18 at 9:45 by telephone and verified that I am speaking with the correct person using two identifiers.  Location: Patient: home  Provider: [place] office   I discussed the limitations, risks, security and privacy concerns of performing an evaluation and management service by telephone and the availability of in person appointments. I also discussed with the patient that there may be a patient responsible charge related to this service. The patient expressed understanding and agreed to proceed.   History of Present Illness:  78 y.o. male with morbid obesity, htn, hyperlipidemia, prediabetes called to report L hand pain; he is concerned he may have been bitten by an insect. He reports he woke up 2 days ago with very swollen and tender hand, has some redness to dorsum of hand though denies localized "point" or bite mark; he reports swelling has improved "50%" but still remains quite tender despite ibuprofen/tylenol and has tried applying some topical hydrocortisone and triple antibiotic. Denies fever/chills.   Td up to date in 2017.     Observations/Objective:  General : Well sounding patient in no apparent distress HEENT: no hoarseness, no cough for duration of visit Lungs: speaks in complete sentences, no audible wheezing, no apparent distress Neurological: alert, oriented x 3 Psychiatric: pleasant, judgement appropriate   Assessment and Plan:  [place] was seen today for telehealth consent.  Diagnoses and all orders for this visit:  Pain in left hand/Insect bite of left hand, initial encounter Suspected insect bite of L hand with residual pain, swelling, erythema Will send in doxy to cover cellulitis, prednisone for edema and pain Continue to monitor for resolution of erythema/swelling; he is to call with any worsening of sx Any fever/chills, worsening pain, streaking red up extremity, myalgias go to the ED over  the weekend. Follow up as needed.  -     predniSONE (DELTASONE) 20 MG tablet; 2 tablets daily for 3 days, 1 tablet daily for 4 days. -     doxycycline (VIBRAMYCIN) 100 MG capsule; Take 1 capsule twice daily with food  Follow Up Instructions:    I discussed the assessment and treatment plan with the patient. The patient was provided an opportunity to ask questions and all were answered. The patient agreed with the plan and demonstrated an understanding of the instructions.   The patient was advised to call back or seek an in-person evaluation if the symptoms worsen or if the condition fails to improve as anticipated.  I provided 15 minutes of non-face-to-face time during this encounter.   Izora Ribas, NP

## 2018-07-21 DIAGNOSIS — M25552 Pain in left hip: Secondary | ICD-10-CM | POA: Diagnosis not present

## 2018-07-21 DIAGNOSIS — M79674 Pain in right toe(s): Secondary | ICD-10-CM | POA: Diagnosis not present

## 2018-07-29 ENCOUNTER — Other Ambulatory Visit: Payer: Self-pay | Admitting: Internal Medicine

## 2018-07-29 DIAGNOSIS — Z6836 Body mass index (BMI) 36.0-36.9, adult: Secondary | ICD-10-CM

## 2018-08-03 ENCOUNTER — Ambulatory Visit: Payer: Self-pay | Admitting: Internal Medicine

## 2018-08-22 DIAGNOSIS — M2021 Hallux rigidus, right foot: Secondary | ICD-10-CM | POA: Diagnosis not present

## 2018-10-17 ENCOUNTER — Other Ambulatory Visit: Payer: Self-pay | Admitting: Internal Medicine

## 2018-11-02 DIAGNOSIS — R29898 Other symptoms and signs involving the musculoskeletal system: Secondary | ICD-10-CM | POA: Diagnosis not present

## 2018-11-02 DIAGNOSIS — I10 Essential (primary) hypertension: Secondary | ICD-10-CM | POA: Diagnosis not present

## 2018-11-03 ENCOUNTER — Other Ambulatory Visit: Payer: Self-pay | Admitting: Adult Health

## 2018-11-05 IMAGING — DX DG ABDOMEN ACUTE W/ 1V CHEST
5 series · 5 of 5 positions shown · non-contrast
Comparison: Chest radiograph 04/12/2017

CLINICAL DATA: Constipation.  Recent left hip surgery.

EXAM:
DG ABDOMEN ACUTE W/ 1V CHEST

[chest pa]
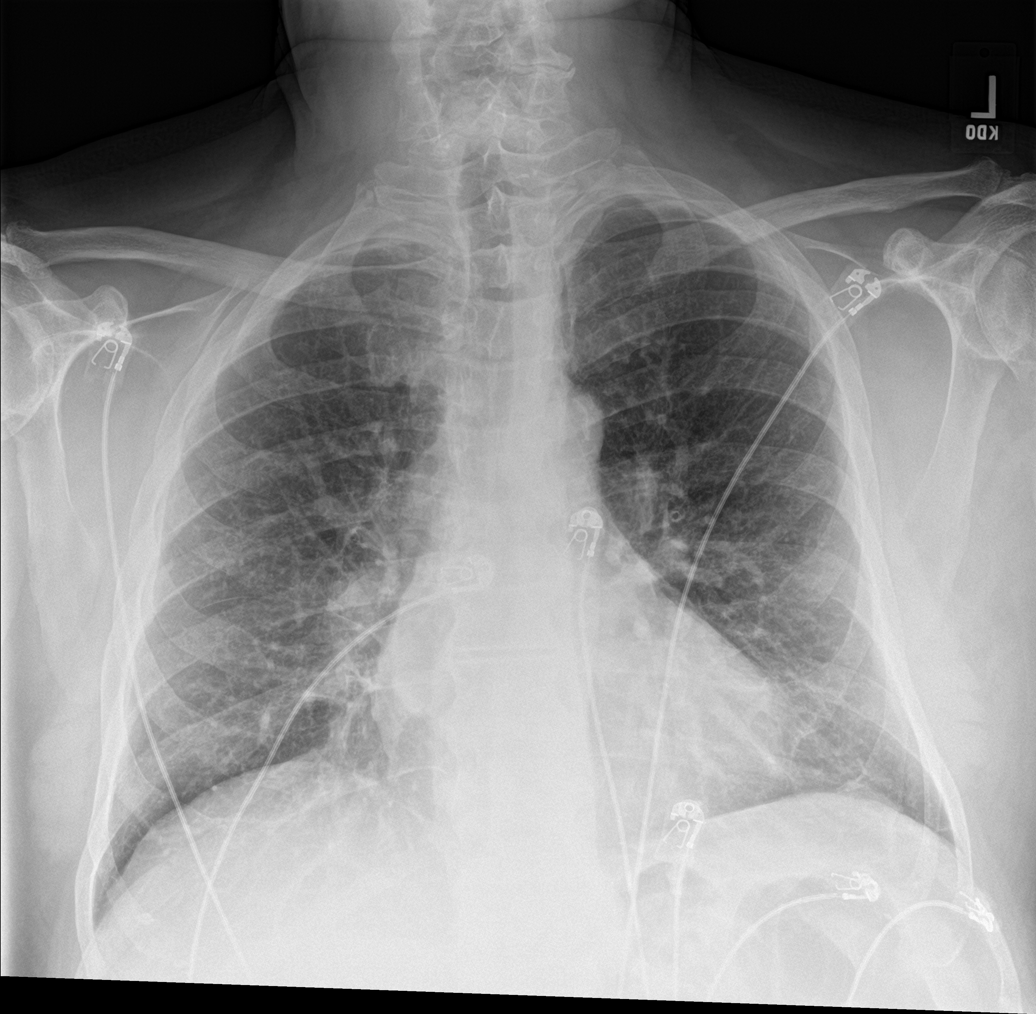

[abdomen erect (1 of 2)]
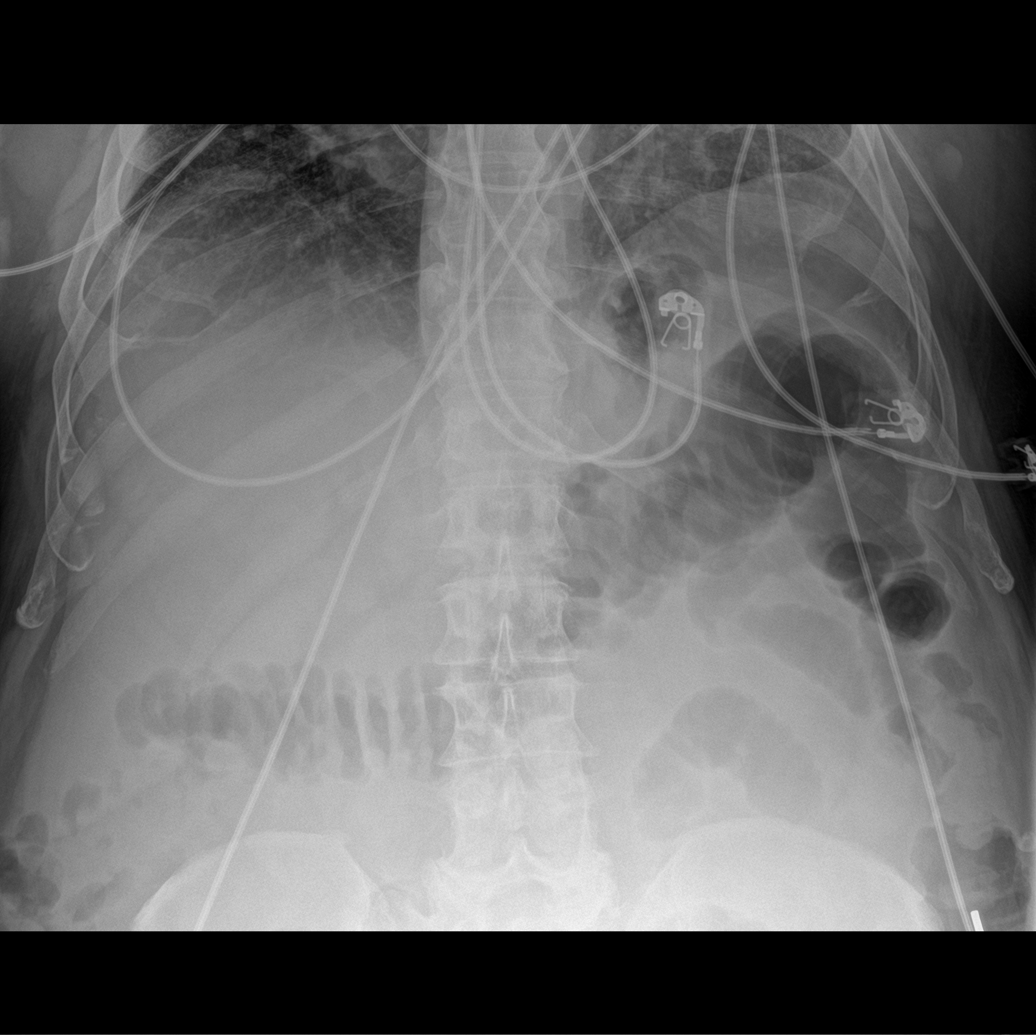

[abdomen supine (1 of 2)]
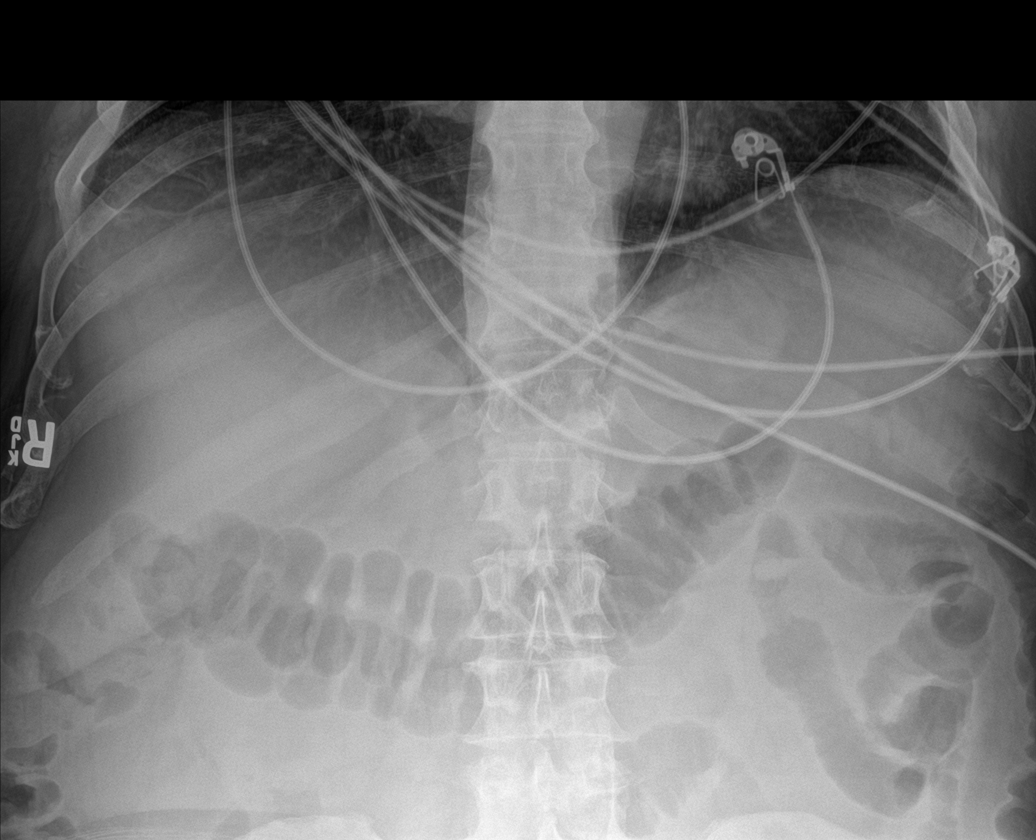

[abdomen supine (2 of 2)]
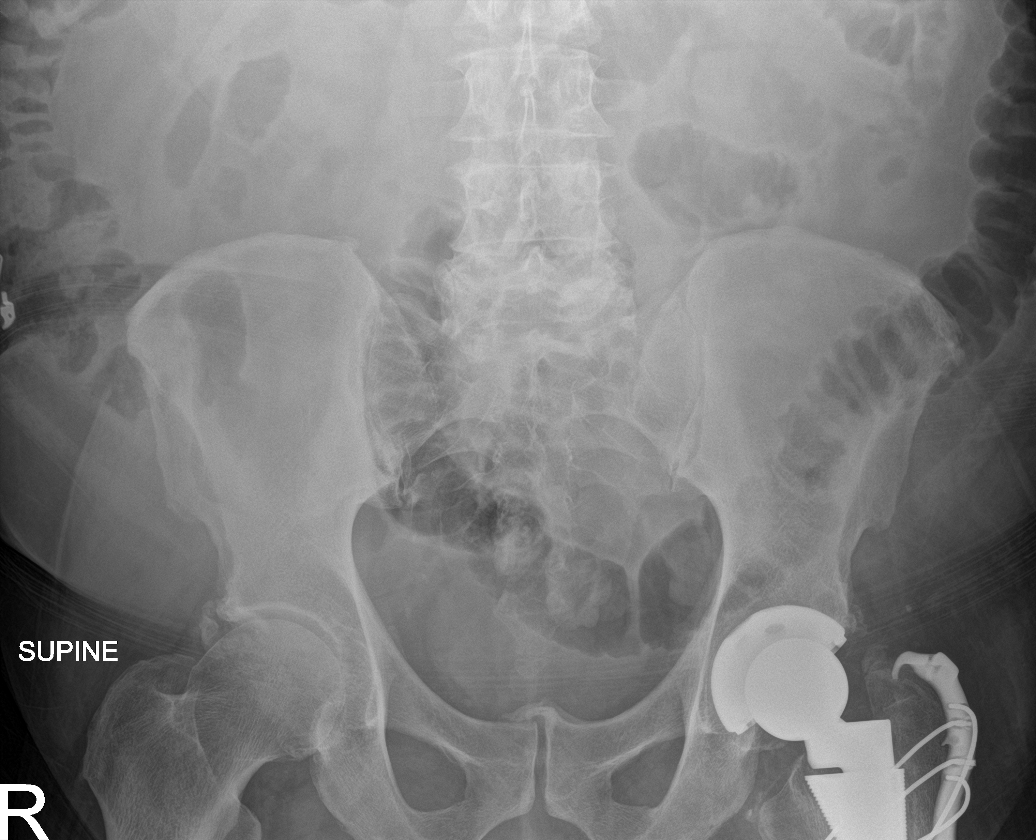

[abdomen erect (2 of 2)]
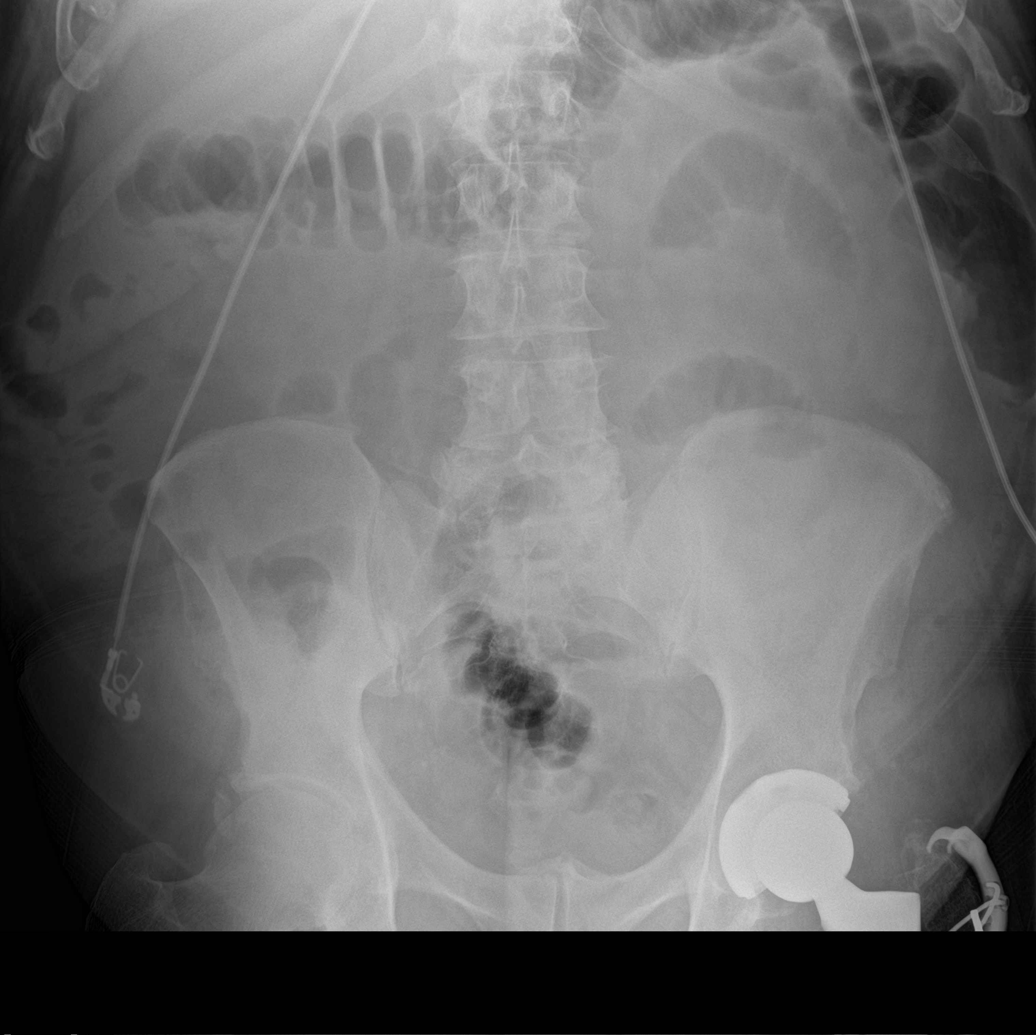

[5 of 5 positions shown; findings below may reference images not displayed]

FINDINGS: The cardiomediastinal contours are normal. The lungs are clear.
There is no free intra-abdominal air. Air-filled nondistended
transverse, descending, and sigmoid colon. No significant formed
stool. Few prominent small bowel loops in the left abdomen with
air-fluid levels. No radiopaque calculi. No acute osseous
abnormalities are seen. Left hip arthroplasty partially included.
IMPRESSION: 1. Air-filled nondistended colon with a few prominent small bowel
loops in the left abdomen, suggesting generalized ileus. No
increased stool burden to suggest constipation. No free air.
2.  No acute pulmonary process.

## 2018-11-09 ENCOUNTER — Other Ambulatory Visit: Payer: Self-pay

## 2018-11-09 DIAGNOSIS — Z20822 Contact with and (suspected) exposure to covid-19: Secondary | ICD-10-CM

## 2018-11-11 ENCOUNTER — Encounter: Payer: Self-pay | Admitting: Gastroenterology

## 2018-11-11 ENCOUNTER — Encounter: Payer: Self-pay | Admitting: Physician Assistant

## 2018-11-11 ENCOUNTER — Ambulatory Visit (INDEPENDENT_AMBULATORY_CARE_PROVIDER_SITE_OTHER): Payer: Medicare Other | Admitting: Physician Assistant

## 2018-11-11 ENCOUNTER — Ambulatory Visit: Payer: Self-pay

## 2018-11-11 VITALS — BP 118/58 | HR 57 | Ht 71.0 in | Wt 257.0 lb

## 2018-11-11 DIAGNOSIS — R1310 Dysphagia, unspecified: Secondary | ICD-10-CM | POA: Diagnosis not present

## 2018-11-11 LAB — NOVEL CORONAVIRUS, NAA

## 2018-11-11 NOTE — Progress Notes (Signed)
Chief Complaint: Dysphagia  HPI:    [name] [name] is a 78 year old Caucasian male, known to Dr. Silverio Decamp, who was referred to me by Unk Pinto, MD for a complaint of dysphagia.      12/28/2016 patient seen in clinic by Dr. Silverio Decamp for dysphagia.  Discussed he had a history of chronic reflux and was previously followed by Dr. [name] with multiple EGDs and esophageal stricture dilated to 18 mm last in 2012 at that time.  At that time discussed that he had never noticed any improvement after EGDs with dilation, if any it was very transient.  That time reluctant to go under procedures as he had had trouble with procedures that year.  Patient had a positive Cologuard.    01/14/2017 DG esophagus with small hiatal hernia negative for stricture or reflux.    02/12/2017 colonoscopy with 2 8-14 mm polyps in the ascending colon and cecum, one 2 mm polyp in transverse colon, 1 6 mm polyp in the sigmoid colon and diverticulosis in the sigmoid and descending colon with nonbleeding internal hemorrhoids.  Repeat recommended in 3 years.    Today, patient presents clinic and explains that he continues to have dysphagia symptoms noting that this can happen with solids and liquids.  He is unable to eat things such as chicken and bread as these get stuck in his throat on the way down.  Tells me that he has felt this way for many years.  We again discussed past EGDs with dilation and patient does not remember feeling any better though this was now 8 years ago.  Discussed patient's last esophagram when seen in clinic 2 years ago for this complaint which was negative for stricture or reflux.  Interestingly, the patient started Tylenol Sinus in the morning and at night over the past few weeks for a cough and some sneezing that he was experiencing and his throat feels some better.  Continues on Pantoprazole 40 mg twice daily which was switched about 8 months ago.  Patient is unsure what he was on before.  Does not describe  reflux symptoms but does not feel this medication has helped with his swallowing.    Patient is somewhat frustrated as he has had continued complaints for years and does not necessarily feel like he is being helped.    Denies fever, chills, weight loss, nausea or vomiting.  Past Medical History:  Diagnosis Date   Arthritis    in left hip   ED (erectile dysfunction)    Enlarged prostate with lower urinary tract symptoms (LUTS)    GERD (gastroesophageal reflux disease)    History of chronic gastritis    History of colon polyps    hyperplastic 2006   History of esophageal stricture    S/P  DILATATION 2009; 2010; 2010 2012   History of kidney stones    hx. multiple kidney stones   History of kidney stones    History of primary hyperparathyroidism    s/p  left superior parathyroidectomy 07-10-2014   Hyperlipidemia    Hypertension    Ileus ([place]) 05/02/2017   Left ureteral stone    Pre-diabetes    Sepsis secondary to UTI ([place]) 05/02/2017    Past Surgical History:  Procedure Laterality Date   COLONOSCOPY  02/17/2005   CYSTO/  LEFT RETROGRADE PYELOGRAM/ STENT PLACEMENT  01/26/2005   CYSTOSCOPY W/ URETEROSCOPY W/ LITHOTRIPSY Left 05/04/2005   CYSTOSCOPY WITH STENT PLACEMENT Left 12/30/2015   Procedure: CYSTOSCOPY WITH STENT PLACEMENT;  Surgeon: Annie Main  Dahlstedt, MD;  Location: [facility];  Service: Urology;  Laterality: Left;   CYSTOSCOPY/RETROGRADE/URETEROSCOPY/STONE EXTRACTION WITH BASKET Left 12/30/2015   Procedure: CYSTOSCOPY/RETROGRADE/URETEROSCOPY/STONE EXTRACTION WITH BASKET;  Surgeon: Franchot Gallo, MD;  Location: [facility];  Service: Urology;  Laterality: Left;   CYSTOSCOPY/URETEROSCOPY/HOLMIUM LASER/STENT PLACEMENT Left 03/09/2016   Procedure: CYSTOSCOPY/RETROGRADE PYELOGRAM/URETEROSCOPY/BASKET STONE EXTRACTION/STENT PLACEMENT;  Surgeon: Franchot Gallo, MD;  Location: [facility];  Service: Urology;  Laterality: Left;    ESOPHAGOGASTRODUODENOSCOPY (EGD) WITH ESOPHAGEAL DILATION  x4  last one 07-21-2010   HOLMIUM LASER APPLICATION Left [id number]   Procedure: HOLMIUM LASER APPLICATION;  Surgeon: Franchot Gallo, MD;  Location: [facility];  Service: Urology;  Laterality: Left;   INGUINAL HERNIA REPAIR Left yrs ago   LUMBAR LAMINECTOMY/DECOMPRESSION MICRODISCECTOMY Left 11/09/2017   Procedure: Left Lumbar Two-Three Microdiscectomy;  Surgeon: Kristeen Miss, MD;  Location: [place];  Service: Neurosurgery;  Laterality: Left;  Left Lumbar Two-Three Microdiscectomy   NEPHROLITHOTOMY Left 02/01/2014   Procedure: NEPHROLITHOTOMY PERCUTANEOUS;  Surgeon: Jorja Loa, MD;  Location: [facility];  Service: Urology;  Laterality: Left;   ORIF HIP FRACTURE Left 04/16/2017   Procedure: OPEN REDUCTION INTERNAL FIXATION HIP GREATER TROCHANTER;  Surgeon: Frederik Pear, MD;  Location: [place];  Service: Orthopedics;  Laterality: Left;   PARATHYROIDECTOMY Left 07/10/2014   Procedure: LEFT SUPERIOR PARATHYROIDECTOMY;  Surgeon: Armandina Gemma, MD;  Location: [place];  Service: General;  Laterality: Left;   TOTAL HIP ARTHROPLASTY Left 06/24/2016   Procedure: LEFT TOTAL HIP ARTHROPLASTY ANTERIOR APPROACH;  Surgeon: Gaynelle Arabian, MD;  Location: [facility];  Service: Orthopedics;  Laterality: Left;   TOTAL HIP REVISION Left 11/18/2016   Procedure: Left femoral revision - posterior approach;  Surgeon: Gaynelle Arabian, MD;  Location: [facility];  Service: Orthopedics;  Laterality: Left;   tumor ear Left age 19    topical growth behind left ear.    Current Outpatient Medications  Medication Sig Dispense Refill   aspirin EC 81 MG tablet Take 81 mg by mouth daily.     b complex vitamins tablet Take 1 tablet by mouth daily.     bisoprolol-hydrochlorothiazide (ZIAC) 5-6.25 MG tablet Take 1 tablet Daily for BP 90 tablet 3   Cholecalciferol (VITAMIN D PO) Take 1 tablet by mouth 2 (two) times daily.      doxycycline (VIBRAMYCIN) 100 MG  capsule Take 1 capsule twice daily with food 20 capsule 0   fenofibrate (TRICOR) 145 MG tablet Take 1 tablet Daily for Triglycerides (Blood Fats) 90 tablet 3   finasteride (PROSCAR) 5 MG tablet Take 5 mg by mouth daily.     Flaxseed, Linseed, (FLAX SEEDS PO) Take 1,000 mg by mouth daily.      HYDROcodone-acetaminophen (NORCO/VICODIN) 5-325 MG tablet Take 1 tablet by mouth every 6 (six) hours as needed for pain.     losartan (COZAAR) 100 MG tablet Take 1 tablet daily for BP (Patient taking differently: Take 100 mg by mouth daily. Take 1 tablet daily for BP) 90 tablet 3   Magnesium Oxide 250 MG TABS Take 250 mg by mouth daily.      methocarbamol (ROBAXIN) 500 MG tablet Take 1 tablet (500 mg total) by mouth every 6 (six) hours as needed for muscle spasms. 40 tablet 2   Omega-3 Fatty Acids (FISH OIL) 1200 MG CAPS Take 1,200 mg by mouth daily.      oxyCODONE-acetaminophen (PERCOCET/ROXICET) 5-325 MG tablet Take 1-2 tablets by mouth every 3 (three) hours as needed for moderate pain  or severe pain. 60 tablet 0   pantoprazole (PROTONIX) 40 MG tablet Take 1 tablet (40 mg total) by mouth 2 (two) times daily. This is for a dosage increase please supply patient with remaining balance 60 tablet 6   phentermine (ADIPEX-P) 37.5 MG tablet Take 1/2 to 1 tablet every morning for Dieting & Weight Loss 90 tablet 1   predniSONE (DELTASONE) 20 MG tablet 2 tablets daily for 3 days, 1 tablet daily for 4 days. 10 tablet 0   tamsulosin (FLOMAX) 0.4 MG CAPS capsule Take 0.4 mg by mouth daily after breakfast.   3   vitamin B-12 (CYANOCOBALAMIN) 1000 MCG tablet Take 1 tablet (1,000 mcg total) by mouth daily. 30 tablet 0   No current facility-administered medications for this visit.    Facility-Administered Medications Ordered in Other Visits  Medication Dose Route Frequency Provider Last Rate Last Dose   ondansetron (ZOFRAN) 4 mg in sodium chloride 0.9 % 50 mL IVPB  4 mg Intravenous Q6H PRN Kristeen Miss, MD         Allergies as of 11/11/2018 - Review Complete 07/14/2018  Allergen Reaction Noted   Iodinated diagnostic agents Shortness Of Breath    Iohexol Shortness Of Breath 07/11/2010    Family History  Problem Relation Age of Onset   Heart disease Mother    Alzheimer's disease Mother    Heart disease Father    Bladder Cancer Sister     Social History   Socioeconomic History   Marital status: Married    Spouse name: Not on file   Number of children: 2   Years of education: Not on file   Highest education level: Not on file  Occupational History   Occupation: real estate business  Social Needs   Financial resource strain: Not on file   Food insecurity    Worry: Not on file    Inability: Not on file   Transportation needs    Medical: Not on file    Non-medical: Not on file  Tobacco Use   Smoking status: Former Smoker    Years: 8.00    Quit date: 03/02/1974    Years since quitting: 44.7   Smokeless tobacco: Never Used  Substance and Sexual Activity   Alcohol use: Yes    Alcohol/week: 1.0 - 2.0 standard drinks    Types: 1 - 2 Glasses of wine per week    Comment: q afternoon- 11/08/2017- none in 10 days   Drug use: No   Sexual activity: Yes  Lifestyle   Physical activity    Days per week: Not on file    Minutes per session: Not on file   Stress: Not on file  Relationships   Social connections    Talks on phone: Not on file    Gets together: Not on file    Attends religious service: Not on file    Active member of club or organization: Not on file    Attends meetings of clubs or organizations: Not on file    Relationship status: Not on file   Intimate partner violence    Fear of current or ex partner: Not on file    Emotionally abused: Not on file    Physically abused: Not on file    Forced sexual activity: Not on file  Other Topics Concern   Not on file  Social History Narrative   Not on file    Review of Systems:    Constitutional:  No weight loss, fever or  chills Cardiovascular: No chest pain Respiratory: No SOB  Gastrointestinal: See HPI and otherwise negative   Physical Exam:  Vital signs: BP (!) 118/58    Pulse (!) 57    Ht 5\' 11"  (1.803 m)    Wt 257 lb (116.6 kg)    BMI 35.84 kg/m   Constitutional:   Pleasant Caucasian male appears to be in NAD, Well developed, Well nourished, alert and cooperative Respiratory: Respirations even and unlabored. Lungs clear to auscultation bilaterally.   No wheezes, crackles, or rhonchi.  Cardiovascular: Normal S1, S2. No MRG. Regular rate and rhythm. No peripheral edema, cyanosis or pallor.  Gastrointestinal:  Soft, nondistended, nontender. No rebound or guarding. Normal bowel sounds. No appreciable masses or hepatomegaly. Psychiatric: Demonstrates good judgement and reason without abnormal affect or behaviors.  MOST RECENT LABS AND IMAGING: CBC    Component Value Date/Time   WBC 13.8 (H) 04/30/2018 0603   RBC 4.04 (L) 04/30/2018 0603   HGB 10.0 (L) 04/30/2018 0603   HCT 32.8 (L) 04/30/2018 0603   PLT 303 04/30/2018 0603   MCV 81.2 04/30/2018 0603   MCH 24.8 (L) 04/30/2018 0603   MCHC 30.5 04/30/2018 0603   RDW 15.6 (H) 04/30/2018 0603   LYMPHSABS 1,572 01/13/2018 1543   MONOABS 1.0 05/05/2017 0520   EOSABS 300 01/13/2018 1543   BASOSABS 42 01/13/2018 1543    CMP     Component Value Date/Time   NA 134 (L) 04/30/2018 0603   K 4.3 04/30/2018 0603   CL 102 04/30/2018 0603   CO2 24 04/30/2018 0603   GLUCOSE 155 (H) 04/30/2018 0603   BUN 21 04/30/2018 0603   CREATININE 1.64 (H) 04/30/2018 0603   CREATININE 1.60 (H) 01/13/2018 1543   CALCIUM 8.7 (L) 04/30/2018 0603   PROT 7.2 04/25/2018 1018   ALBUMIN 3.7 04/25/2018 1018   AST 28 04/25/2018 1018   ALT 22 04/25/2018 1018   ALKPHOS 33 (L) 04/25/2018 1018   BILITOT 0.7 04/25/2018 1018   GFRNONAA 40 (L) 04/30/2018 0603   GFRNONAA 42 (L) 01/13/2018 1543   GFRAA 47 (L) 04/30/2018 0603   GFRAA 48 (L) 01/13/2018 1543     Assessment: 1.  Dysphagia: Ongoing for years, had previous dilations with [name], last in 2012, patient does not remember benefit from this, esophagram 2018 with hiatal hernia and no stricture, some better with Tylenol Sinus twice daily over the past few weeks; consider stricture versus narrowing versus other abnormality+/-postnasal drip and mucus 2.  GERD: Controlled with pantoprazole 40 twice daily  Plan: 1.  Discussed with patient that he should have a repeat EGD as it has been 8 years since we have looked in his esophagus.  Scheduled EGD with dilation in the [place] with Dr. Silverio Decamp.  Did discuss risks, benefits, limitations and alternatives and the patient agrees to proceed. 2.  Continue Pantoprazole 40 mg twice daily, 30-60 minutes before eating breakfast and dinner.  Discussed with patient that if there is any signs of inflammation or reflux then Dr. Silverio Decamp may want to switch this medication. 3.  Reviewed anti-dysphagia measures. 4.  Briefly discussed with patient that if the EGD is unrevealing or unhelpful, could consider referral to an ENT given that taking Tylenol Sinus twice a day has helped with his symptoms, question whether or not mucus is making it feel as though things are hard to swallow. 5.  Patient to follow in clinic per recommendations from Dr. Silverio Decamp after time of procedure.  Ellouise Newer, PA-C [facility] 11/11/2018,  11:24 AM  Cc: Unk Pinto, MD

## 2018-11-11 NOTE — Telephone Encounter (Signed)
Patient and wife calling to get results of Covid testing result of Covid-19.  Lab results state that the was not sufficient specimen enough to diagnose the specimen .  Recommended repeat the test Patient and wife voices understanding.

## 2018-11-11 NOTE — Patient Instructions (Signed)
You have been scheduled for an endoscopy. Please follow written instructions given to you at your visit today. If you use inhalers (even only as needed), please bring them with you on the day of your procedure.  Continue on Pantoprazole 40mg  twice daily.  It was a pleasure to see you today!  Ellouise Newer, PA-C

## 2018-11-15 ENCOUNTER — Telehealth: Payer: Self-pay | Admitting: Gastroenterology

## 2018-11-15 NOTE — Telephone Encounter (Signed)
Is feeling better and does not need a call back anymore.

## 2018-11-15 NOTE — Telephone Encounter (Signed)
Pt is scheduled for an EGD 9/17 and stated that he woke up with a sore neck.  Pt is unable to turn his head.  He would like to know whether this would affect the procedure.

## 2018-11-16 ENCOUNTER — Telehealth: Payer: Self-pay

## 2018-11-16 NOTE — Telephone Encounter (Signed)
Covid-19 screening questions   Do you now or have you had a fever in the last 14 days? NO   Do you have any respiratory symptoms of shortness of breath or cough now or in the last 14 days? NO  Do you have any family members or close contacts with diagnosed or suspected Covid-19 in the past 14 days? NO  Have you been tested for Covid-19 and found to be positive? NO        

## 2018-11-17 ENCOUNTER — Encounter: Payer: Self-pay | Admitting: Gastroenterology

## 2018-11-17 ENCOUNTER — Other Ambulatory Visit: Payer: Self-pay

## 2018-11-17 ENCOUNTER — Other Ambulatory Visit: Payer: Self-pay | Admitting: Gastroenterology

## 2018-11-17 ENCOUNTER — Ambulatory Visit (AMBULATORY_SURGERY_CENTER): Payer: Medicare Other | Admitting: Gastroenterology

## 2018-11-17 VITALS — BP 108/64 | HR 50 | Temp 97.9°F | Resp 18 | Ht 71.0 in | Wt 257.0 lb

## 2018-11-17 DIAGNOSIS — K228 Other specified diseases of esophagus: Secondary | ICD-10-CM | POA: Diagnosis not present

## 2018-11-17 DIAGNOSIS — R1310 Dysphagia, unspecified: Secondary | ICD-10-CM | POA: Diagnosis not present

## 2018-11-17 DIAGNOSIS — K219 Gastro-esophageal reflux disease without esophagitis: Secondary | ICD-10-CM | POA: Diagnosis not present

## 2018-11-17 DIAGNOSIS — K2970 Gastritis, unspecified, without bleeding: Secondary | ICD-10-CM | POA: Diagnosis not present

## 2018-11-17 DIAGNOSIS — K3189 Other diseases of stomach and duodenum: Secondary | ICD-10-CM | POA: Diagnosis not present

## 2018-11-17 NOTE — Progress Notes (Signed)
PT taken to PACU. Monitors in place. VSS. Report given to RN. 

## 2018-11-17 NOTE — Patient Instructions (Signed)
Information on gastritis given to you today.  Await pathology results.  Return to GI office in 2 months.  YOU HAD AN ENDOSCOPIC PROCEDURE TODAY AT [facility]:   Refer to the procedure report that was given to you for any specific questions about what was found during the examination.  If the procedure report does not answer your questions, please call your gastroenterologist to clarify.  If you requested that your care partner not be given the details of your procedure findings, then the procedure report has been included in a sealed envelope for you to review at your convenience later.  YOU SHOULD EXPECT: Some feelings of bloating in the abdomen. Passage of more gas than usual.  Walking can help get rid of the air that was put into your GI tract during the procedure and reduce the bloating. If you had a lower endoscopy (such as a colonoscopy or flexible sigmoidoscopy) you may notice spotting of blood in your stool or on the toilet paper. If you underwent a bowel prep for your procedure, you may not have a normal bowel movement for a few days.  Please Note:  You might notice some irritation and congestion in your nose or some drainage.  This is from the oxygen used during your procedure.  There is no need for concern and it should clear up in a day or so.  SYMPTOMS TO REPORT IMMEDIATELY:     Following upper endoscopy (EGD)  Vomiting of blood or coffee ground material  New chest pain or pain under the shoulder blades  Painful or persistently difficult swallowing  New shortness of breath  Fever of 100F or higher  Black, tarry-looking stools  For urgent or emergent issues, a gastroenterologist can be reached at any hour by calling [phone].   DIET:  We do recommend a small meal at first, but then you may proceed to your regular diet.  Drink plenty of fluids but you should avoid alcoholic beverages for 24 hours.  ACTIVITY:  You should plan to take it easy for the  rest of today and you should NOT DRIVE or use heavy machinery until tomorrow (because of the sedation medicines used during the test).    FOLLOW UP: Our staff will call the number listed on your records 48-72 hours following your procedure to check on you and address any questions or concerns that you may have regarding the information given to you following your procedure. If we do not reach you, we will leave a message.  We will attempt to reach you two times.  During this call, we will ask if you have developed any symptoms of COVID 19. If you develop any symptoms (ie: fever, flu-like symptoms, shortness of breath, cough etc.) before then, please call [phone].  If you test positive for Covid 19 in the 2 weeks post procedure, please call and report this information to [place].    If any biopsies were taken you will be contacted by phone or by letter within the next 1-3 weeks.  Please call us at [phone] if you have not heard about the biopsies in 3 weeks.    SIGNATURES/CONFIDENTIALITY: You and/or your care partner have signed paperwork which will be entered into your electronic medical record.  These signatures attest to the fact that that the information above on your After Visit Summary has been reviewed and is understood.  Full responsibility of the confidentiality of this discharge information lies with you and/or your care-partner.

## 2018-11-17 NOTE — Progress Notes (Signed)
Called to room to assist during endoscopic procedure.  Patient ID and intended procedure confirmed with present staff. Received instructions for my participation in the procedure from the performing physician.  

## 2018-11-17 NOTE — Progress Notes (Signed)
Temp check by JB/vital check by CW.  No updates to medical or surgical history.

## 2018-11-17 NOTE — Op Note (Signed)
[facility] Patient Name: [name] [name] Procedure Date: 11/17/2018 10:01 AM MRN: [id number] Endoscopist: Mauri Pole , MD Age: 78 Referring MD:  Date of Birth: August 27, 1940 Gender: Male Account #: [id number] Procedure:                Upper GI endoscopy Indications:              Dysphagia Medicines:                Monitored Anesthesia Care Procedure:                Pre-Anesthesia Assessment:                           - Prior to the procedure, a History and Physical                            was performed, and patient medications and                            allergies were reviewed. The patient's tolerance of                            previous anesthesia was also reviewed. The risks                            and benefits of the procedure and the sedation                            options and risks were discussed with the patient.                            All questions were answered, and informed consent                            was obtained. Prior Anticoagulants: The patient has                            taken no previous anticoagulant or antiplatelet                            agents. ASA Grade Assessment: III - A patient with                            severe systemic disease. After reviewing the risks                            and benefits, the patient was deemed in                            satisfactory condition to undergo the procedure.                           After obtaining informed consent, the endoscope was  passed under direct vision. Throughout the                            procedure, the patient's blood pressure, pulse, and                            oxygen saturations were monitored continuously. The                            Endoscope was introduced through the mouth, and                            advanced to the second part of duodenum. The upper                            GI endoscopy was accomplished without  difficulty.                            The patient tolerated the procedure well. Scope In: Scope Out: Findings:                 No endoscopic abnormality was evident in the                            esophagus to explain the patient's complaint of                            dysphagia. It was decided, however, to proceed with                            dilation of the entire esophagus. The scope was                            withdrawn. Dilation was performed with a Maloney                            dilator with no resistance at 54 Fr. The dilation                            site was examined following endoscope reinsertion                            and showed no change.                           The Z-line was regular and was found 40 cm from the                            incisors.                           The examined esophagus was normal. Biopsies were  obtained from the proximal and distal esophagus                            with cold forceps for histology of suspected                            eosinophilic esophagitis.                           Patchy mild inflammation characterized by                            congestion (edema), erosions and erythema was found                            in the prepyloric region of the stomach. Biopsies                            were taken with a cold forceps for Helicobacter                            pylori testing.                           The examined duodenum was normal. Complications:            No immediate complications. Estimated Blood Loss:     Estimated blood loss was minimal. Impression:               - No endoscopic esophageal abnormality to explain                            patient's dysphagia. Esophagus dilated. Dilated.                           - Z-line regular, 40 cm from the incisors.                           - Normal esophagus. Biopsied.                           - Gastritis. Biopsied.                            - Normal examined duodenum. Recommendation:           - Patient has a contact number available for                            emergencies. The signs and symptoms of potential                            delayed complications were discussed with the                            patient. Return to normal activities tomorrow.  Written discharge instructions were provided to the                            patient.                           - Resume previous diet.                           - Continue present medications.                           - Await pathology results.                           - Perform routine esophageal manometry at                            appointment to be scheduled.                           - Return to GI office in 2 months. Mauri Pole, MD 11/17/2018 10:24:15 AM This report has been signed electronically.

## 2018-11-21 ENCOUNTER — Telehealth: Payer: Self-pay | Admitting: *Deleted

## 2018-11-21 NOTE — Telephone Encounter (Signed)
1. Have you developed a fever since your procedure? no  2.   Have you had an respiratory symptoms (SOB or cough) since your procedure? no  3.   Have you tested positive for COVID 19 since your procedure no  4.   Have you had any family members/close contacts diagnosed with the COVID 19 since your procedure?  no   If yes to any of these questions please route to Joylene Keldon, RN and Alphonsa Gin, Therapist, sports.  Follow up Call-  Call back number 11/17/2018 02/12/2017  Post procedure Call Back phone  # [phone]  Permission to leave phone message Yes Yes  Some recent data might be hidden     Patient questions:  Do you have a fever, pain , or abdominal swelling? No. Pain Score  0 *  Have you tolerated food without any problems? Yes.    Have you been able to return to your normal activities? Yes.    Do you have any questions about your discharge instructions: Diet   No. Medications  No. Follow up visit  No.  Do you have questions or concerns about your Care? No.  Actions: * If pain score is 4 or above: No action needed, pain <4. Patient stating still "backing up when he eats." patient has been in touch with the provider. stating she said we have other options to help.

## 2018-11-25 ENCOUNTER — Telehealth: Payer: Self-pay

## 2018-11-25 ENCOUNTER — Encounter: Payer: Self-pay | Admitting: Gastroenterology

## 2018-11-25 NOTE — Telephone Encounter (Signed)
Patient had an EGD on 917/20.  Per procedure note he needs an esophageal manometry for dysphagia and then follow up in office in 2 months. Called to the patient. No answer. Left a message on the voicemail to return my call.

## 2018-11-30 NOTE — Telephone Encounter (Signed)
Spoke with the spouse. They are out of town for the next 2 weeks. She states they will call us when they return.

## 2018-11-30 NOTE — Telephone Encounter (Signed)
Pt's wife returned your call. Pls call again at this phone [phone]

## 2018-11-30 NOTE — Telephone Encounter (Signed)
Left a message on the voicemail for a return call about scheduling.

## 2018-12-12 ENCOUNTER — Other Ambulatory Visit: Payer: Self-pay

## 2018-12-12 ENCOUNTER — Telehealth: Payer: Self-pay | Admitting: Gastroenterology

## 2018-12-12 DIAGNOSIS — R1310 Dysphagia, unspecified: Secondary | ICD-10-CM

## 2018-12-12 NOTE — Telephone Encounter (Signed)
Pt's wife Vaughan Basta called to discuss pt's acid reflux.

## 2018-12-12 NOTE — Telephone Encounter (Signed)
Pt scheduled for EM at [facility] 12/26/18@10 :30am, pt to arrive there at 10am. Prep instructions mailed to pt. Pt to have covid testing 12/22/18@11am . Pt aware of appts.

## 2018-12-13 NOTE — Telephone Encounter (Signed)
Pt reported that he has had an ongoing cough for about two years and that it is worse in the morning.  Pt would like you to be aware.

## 2018-12-13 NOTE — Telephone Encounter (Signed)
Ok, thank you

## 2018-12-13 NOTE — Telephone Encounter (Signed)
FYI- Patient reports ongoing cough and morning mucous that started the beginning of 2018. The patient reports it worsens when he eats sugar or chocolate. The patient stated "If you were going to put a tube down my nose or throat, I wanted you to be aware." This is not COVID related, the patient has had this on going symptom now for 2 years, he just wanted the MD to be aware. Patient will be screened for COVID per policy on [id number].

## 2018-12-14 ENCOUNTER — Other Ambulatory Visit: Payer: Self-pay

## 2018-12-14 ENCOUNTER — Encounter: Payer: Self-pay | Admitting: Physician Assistant

## 2018-12-14 ENCOUNTER — Ambulatory Visit (INDEPENDENT_AMBULATORY_CARE_PROVIDER_SITE_OTHER): Payer: Medicare Other | Admitting: Physician Assistant

## 2018-12-14 VITALS — BP 124/80 | HR 59 | Temp 98.1°F | Ht 71.0 in | Wt 259.6 lb

## 2018-12-14 DIAGNOSIS — H00014 Hordeolum externum left upper eyelid: Secondary | ICD-10-CM

## 2018-12-14 MED ORDER — ERYTHROMYCIN 5 MG/GM OP OINT: TOPICAL_OINTMENT | OPHTHALMIC | 1 refills | Status: DC

## 2018-12-14 NOTE — Progress Notes (Signed)
SUBJECTIVE:  [name] [name] is a 77 y.o. male who complains of a left upper eyelid stye for 1 week(s). Eyes are watering.  No fever, chills, no URI symptoms, no history of foreign body in the eye.  Vision has been normal. He has been doing hot compresses just once a day.   Blood pressure 124/80, pulse (!) 59, temperature 98.1 F (36.7 C), height 5\' 11"  (1.803 m), weight 259 lb 9.6 oz (117.8 kg), SpO2 96 %.  Medications Current Outpatient Medications on File Prior to Visit  Medication Sig  . aspirin EC 81 MG tablet Take 81 mg by mouth daily.  Marland Kitchen b complex vitamins tablet Take 1 tablet by mouth daily.  . bisoprolol-hydrochlorothiazide (ZIAC) 5-6.25 MG tablet Take 1 tablet Daily for BP  . Cholecalciferol (VITAMIN D PO) Take 1 tablet by mouth 2 (two) times daily.   . fenofibrate (TRICOR) 145 MG tablet Take 1 tablet Daily for Triglycerides (Blood Fats)  . finasteride (PROSCAR) 5 MG tablet Take 5 mg by mouth daily.  . Flaxseed, Linseed, (FLAX SEEDS PO) Take 1,000 mg by mouth daily.   Marland Kitchen losartan (COZAAR) 100 MG tablet Take 1 tablet daily for BP (Patient taking differently: Take 100 mg by mouth daily. Take 1 tablet daily for BP)  . Magnesium Oxide 250 MG TABS Take 250 mg by mouth 2 (two) times daily.   . meloxicam (MOBIC) 7.5 MG tablet Take 7.5 mg by mouth daily.  . Omega-3 Fatty Acids (FISH OIL) 1200 MG CAPS Take 1,200 mg by mouth 2 (two) times daily.   . pantoprazole (PROTONIX) 40 MG tablet Take 1 tablet (40 mg total) by mouth 2 (two) times daily. This is for a dosage increase please supply patient with remaining balance  . pseudoephedrine-acetaminophen (TYLENOL SINUS) 30-500 MG TABS tablet Take 1 tablet by mouth every 4 (four) hours as needed.  . tamsulosin (FLOMAX) 0.4 MG CAPS capsule Take 0.4 mg by mouth daily after breakfast.   . vitamin B-12 (CYANOCOBALAMIN) 1000 MCG tablet Take 1 tablet (1,000 mcg total) by mouth daily.   Current Facility-Administered Medications on File Prior to Visit   Medication  . ondansetron (ZOFRAN) 4 mg in sodium chloride 0.9 % 50 mL IVPB    Problem list He has Stricture and stenosis of esophagus; Hyperlipidemia; Hypertension; GERD (gastroesophageal reflux disease); Kidney stones; Morbidly obese ([place]); Vitamin D deficiency; Prediabetes; Medication management; Hyperparathyroidism, primary ([place]); Encounter for Medicare annual wellness exam; CKD (chronic kidney disease) stage 3, GFR 30-59 ml/min ([place]); Snoring; OA (osteoarthritis) of hip; Normocytic anemia; Vitamin B12 deficiency; and Herniated nucleus pulposus, L2-3 left on their problem list.   OBJECTIVE:  He appears well, vitals are normal. Hordeolum noted left upper eyelid. PERLA, fundi normal. Visual acuity normal. Ears, throat normal, no periorbital cellulitis, no neck lymphadenopathy.  ASSESSMENT AND PLAN:   Stye/hordeolum Frequent warm soaks  use antibiotic ophthalmic ointment as prescribed follow up if symptoms persist or worsen.  It may take several days for this to resolve.  Rarely, these persist or enlarge and in that event, he will need to see an Ophthalmologist. Patient agrees with the medical treatment plan.

## 2018-12-14 NOTE — Patient Instructions (Addendum)
Frequent warm soaks  use antibiotic ophthalmic ointment as prescribed follow up if symptoms persist or worsen, will send in oral antibiotic It may take several days for this to resolve.  Rarely, these persist or enlarge and in that event, he will need to see an Ophthalmologist. Patient agrees with the medical treatment plan.   Stye  A stye, also known as a hordeolum, is a bump that forms on an eyelid. It may look like a pimple next to the eyelash. A stye can form inside the eyelid (internal stye) or outside the eyelid (external stye). A stye can cause redness, swelling, and pain on the eyelid. Styes are very common. Anyone can get them at any age. They usually occur in just one eye, but you may have more than one in either eye. What are the causes? A stye is caused by an infection. The infection is almost always caused by bacteria called Staphylococcus aureus. This is a common type of bacteria that lives on the skin. An internal stye may result from an infected oil-producing gland inside the eyelid. An external stye may be caused by an infection at the base of the eyelash (hair follicle). What increases the risk? You are more likely to develop a stye if:  You have had a stye before.  You have any of these conditions: ? Diabetes. ? Red, itchy, inflamed eyelids (blepharitis). ? A skin condition such as seborrheic dermatitis or rosacea. ? High fat levels in your blood (lipids). What are the signs or symptoms? The most common symptom of a stye is eyelid pain. Internal styes are more painful than external styes. Other symptoms may include:  Painful swelling of your eyelid.  A scratchy feeling in your eye.  Tearing and redness of your eye.  Pus draining from the stye. How is this diagnosed? Your health care provider may be able to diagnose a stye just by examining your eye. The health care provider may also check to make sure:  You do not have a fever or other signs of a more serious  infection.  The infection has not spread to other parts of your eye or areas around your eye. How is this treated? Most styes will clear up in a few days without treatment or with warm compresses applied to the area. You may need to use antibiotic drops or ointment to treat an infection. In some cases, if your stye does not heal with routine treatment, your health care provider may drain pus from the stye using a thin blade or needle. This may be done if the stye is large, causing a lot of pain, or affecting your vision. Follow these instructions at home:  Take over-the-counter and prescription medicines only as told by your health care provider. This includes eye drops or ointments.  If you were prescribed an antibiotic medicine, apply or use it as told by your health care provider. Do not stop using the antibiotic even if your condition improves.  Apply a warm, wet cloth (warm compress) to your eye for 5-10 minutes, 4 times a day.  Clean the affected eyelid as directed by your health care provider.  Do not wear contact lenses or eye makeup until your stye has healed.  Do not try to pop or drain the stye.  Do not rub your eye. Contact a health care provider if:  You have chills or a fever.  Your stye does not go away after several days.  Your stye affects your vision.  Your  eyeball becomes swollen, red, or painful. Get help right away if:  You have pain when moving your eye around. Summary  A stye is a bump that forms on an eyelid. It may look like a pimple next to the eyelash.  A stye can form inside the eyelid (internal stye) or outside the eyelid (external stye). A stye can cause redness, swelling, and pain on the eyelid.  Your health care provider may be able to diagnose a stye just by examining your eye.  Apply a warm, wet cloth (warm compress) to your eye for 5-10 minutes, 4 times a day. This information is not intended to replace advice given to you by your health  care provider. Make sure you discuss any questions you have with your health care provider. Document Released: 11/26/2004 Document Revised: 01/29/2017 Document Reviewed: 10/29/2016 Elsevier Patient Education  2020 [employer].

## 2018-12-19 ENCOUNTER — Other Ambulatory Visit: Payer: Self-pay | Admitting: Gastroenterology

## 2018-12-19 NOTE — Progress Notes (Signed)
Reviewed and agree with documentation and assessment and plan. K. Veena Nandigam , MD   

## 2018-12-20 ENCOUNTER — Telehealth: Payer: Self-pay | Admitting: Gastroenterology

## 2018-12-20 ENCOUNTER — Other Ambulatory Visit: Payer: Self-pay

## 2018-12-20 ENCOUNTER — Other Ambulatory Visit: Payer: Self-pay | Admitting: Internal Medicine

## 2018-12-20 DIAGNOSIS — Z20822 Contact with and (suspected) exposure to covid-19: Secondary | ICD-10-CM

## 2018-12-20 NOTE — Telephone Encounter (Signed)
Left message to call back and discuss his symptoms. I have not cancelled the appointment at this time.

## 2018-12-20 NOTE — Telephone Encounter (Signed)
Ok

## 2018-12-20 NOTE — Telephone Encounter (Signed)
Pt would like to cancel manometry at [facility] scheduled on 10/26 due to Covid-19 concerns, he prefers to avoid going to a hospital. He wants to know if there is any medication that could help him in the meantime. Pls call him.

## 2018-12-20 NOTE — Telephone Encounter (Signed)
Spoke with the patient about his symptoms of dysphagia.  He states "if I take my time, then it's not a problems." Admits to "I go fast, I eat fast, I do everything fast."  He is not comfortable with going into the hospital. He is willing to wait for now to have the esophageal mano. He has picked up the Protonix and will take it instead of the other PPI. He asks to have his esophageal manometry cancelled for now.

## 2018-12-22 ENCOUNTER — Ambulatory Visit: Payer: Medicare Other | Admitting: Adult Health Nurse Practitioner

## 2018-12-22 ENCOUNTER — Other Ambulatory Visit (HOSPITAL_COMMUNITY): Payer: Medicare Other

## 2018-12-22 LAB — NOVEL CORONAVIRUS, NAA: SARS-CoV-2, NAA: NOT DETECTED

## 2018-12-23 ENCOUNTER — Telehealth: Payer: Self-pay | Admitting: Hematology

## 2018-12-23 NOTE — Telephone Encounter (Signed)
Pt wife is aware covid 19 test is neg

## 2018-12-26 ENCOUNTER — Encounter (HOSPITAL_COMMUNITY): Payer: Self-pay

## 2018-12-26 ENCOUNTER — Ambulatory Visit (HOSPITAL_COMMUNITY): Admit: 2018-12-26 | Payer: Medicare Other | Admitting: Gastroenterology

## 2018-12-26 SURGERY — MANOMETRY, ESOPHAGUS

## 2019-01-17 ENCOUNTER — Other Ambulatory Visit: Payer: Self-pay | Admitting: Internal Medicine

## 2019-02-05 ENCOUNTER — Encounter: Payer: Self-pay | Admitting: Internal Medicine

## 2019-02-05 NOTE — Progress Notes (Signed)
Annual  Screening/Preventative Visit  & Comprehensive Evaluation & Examination     This very nice 78 y.o. MWM presents for a Screening /Preventative Visit & comprehensive evaluation and management of multiple medical co-morbidities.  Patient has been followed for HTN, HLD, T2_NIDDM  and Vitamin D Deficiency.  Patienthas hx/olithotripsy for recurrent kidney stones and in May 2016, he underwent a Lt Superior Parathyroidectomy. In Sept 2019, he had L2-L3 Microdiscectomy by Dr Ellene Route and continues to have Left LBP & Left Hip pains.  Patient has GERD and recently had EGD to evaluate Dysphagia on 10/26 by Dr Silverio Decamp. Patient describes sx's suspicious for DES.      HTN predates since 2002. Patient's BP has been controlled at home.  Today's BP is at goal - 126/78. Patient denies any cardiac symptoms as chest pain, palpitations, shortness of breath, dizziness or ankle swelling.     Patient's hyperlipidemia is controlled with diet and medications. Patient denies myalgias or other medication SE's. Last lipids were at goal albeit elevated Trig's:  Lab Results  Component Value Date   CHOL 184 01/13/2018   HDL 53 01/13/2018   LDLCALC 97 01/13/2018   TRIG 223 (H) 01/13/2018   CHOLHDL 3.5 01/13/2018      Patient has hx/o Morbid Obesity (BMI 36) and  Prediabetes (2010) and then more recent T2_NIDDM  (A1c 6.8% / Feb 2019)  w/CKD3 (GFR 40) and patient denies reactive hypoglycemic symptoms, visual blurring, diabetic polys or paresthesias. Last A1c was not at goal:   Lab Results  Component Value Date   HGBA1C 6.9 (H) 04/25/2018       Finally, patient has history of Vitamin D Deficiency ("36" / 2009) and last vitamin D was at goal:  Lab Results  Component Value Date   VD25OH 57 01/13/2018    Current Outpatient Medications on File Prior to Visit  Medication Sig  . aspirin EC 81 MG tablet Take 81 mg by mouth daily.  Marland Kitchen b complex vitamins tablet Take 1 tablet by mouth daily.  .  bisoprolol-hydrochlorothiazide (ZIAC) 5-6.25 MG tablet Take 1 tablet Daily for BP  . Cholecalciferol (VITAMIN D PO) Take 5,000 Units by mouth daily.   Marland Kitchen erythromycin ophthalmic ointment Apply 1 cm ribbon in affected eye(s) up to 2 times daily for 7 days  . fenofibrate (TRICOR) 145 MG tablet Take 1 tablet Daily for Triglycerides (Blood Fats)  . finasteride (PROSCAR) 5 MG tablet Take 5 mg by mouth daily.  . Flaxseed, Linseed, (FLAX SEEDS PO) Take 1,000 mg by mouth daily.   Marland Kitchen losartan (COZAAR) 100 MG tablet Take 1 tablet daily for BP (Patient taking differently: Take 100 mg by mouth daily. Take 1 tablet daily for BP)  . Magnesium Oxide 250 MG TABS Take 250 mg by mouth 2 (two) times daily.   . meloxicam (MOBIC) 15 MG tablet TAKE 1/2 TO 1 TABLET DAILY WITH FOOD FOR PAIN & INFLAMMATION  . Omega-3 Fatty Acids (FISH OIL) 1200 MG CAPS Take 1,200 mg by mouth 2 (two) times daily.   . pantoprazole (PROTONIX) 40 MG tablet TAKE 1 TABLET BY MOUTH TWICE A DAY.  Marland Kitchen pseudoephedrine-acetaminophen (TYLENOL SINUS) 30-500 MG TABS tablet Take 1 tablet by mouth every 4 (four) hours as needed.  . tamsulosin (FLOMAX) 0.4 MG CAPS capsule Take 0.4 mg by mouth daily after breakfast.   . vitamin B-12 (CYANOCOBALAMIN) 1000 MCG tablet Take 1 tablet (1,000 mcg total) by mouth daily.   No current facility-administered medications on file prior to visit.  Allergies  Allergen Reactions  . Iodinated Diagnostic Agents Shortness Of Breath  . Iohexol Shortness Of Breath   Past Medical History:  Diagnosis Date  . Arthritis    in left hip  . ED (erectile dysfunction)   . Enlarged prostate with lower urinary tract symptoms (LUTS)   . GERD (gastroesophageal reflux disease)   . History of chronic gastritis   . History of colon polyps    hyperplastic 2006  . History of esophageal stricture    S/P  DILATATION 2009; 2010; 2010 2012  . History of kidney stones    hx. multiple kidney stones  . History of kidney stones   .  History of primary hyperparathyroidism    s/p  left superior parathyroidectomy 07-10-2014  . Hyperlipidemia   . Hypertension   . Ileus ([place]) 05/02/2017  . Left ureteral stone   . Pre-diabetes   . Sepsis secondary to UTI [facility]) 05/02/2017   Health Maintenance  Topic Date Due  . INFLUENZA VACCINE  10/01/2018  . COLONOSCOPY  02/13/2020  . TETANUS/TDAP  09/23/2025  . PNA vac Low Risk Adult  Completed   Immunization History  Administered Date(s) Administered  . Influenza Split 12/27/2012  . Influenza, High Dose Seasonal PF 01/31/2014, 11/27/2014, 01/13/2018  . Influenza-Unspecified 01/12/2017  . Pneumococcal Conjugate-13 03/25/2015  . Pneumococcal Polysaccharide-23 03/02/2008  . Td 03/02/2005, 09/24/2015  . Zoster 03/02/2006   Last Colon - 04/15/2016 - Dr Silverio Decamp - recc f/u Colon in 3 years - due Feb 2021.   Past Surgical History:  Procedure Laterality Date  . BACK SURGERY  04/2018   fusion L2 and L3  . COLONOSCOPY  02/17/2005  . CYSTO/  LEFT RETROGRADE PYELOGRAM/ STENT PLACEMENT  01/26/2005  . CYSTOSCOPY W/ URETEROSCOPY W/ LITHOTRIPSY Left 05/04/2005  . CYSTOSCOPY WITH STENT PLACEMENT Left 12/30/2015   Procedure: CYSTOSCOPY WITH STENT PLACEMENT;  Surgeon: Franchot Gallo, MD;  Location[facility];  Service: Urology;  Laterality: Left;  . CYSTOSCOPY/RETROGRADE/URETEROSCOPY/STONE EXTRACTION WITH BASKET Left 12/30/2015   Procedure: CYSTOSCOPY/RETROGRADE/URETEROSCOPY/STONE EXTRACTION WITH BASKET;  Surgeon: Franchot Gallo, MD;  Location: [facility];  Service: Urology;  Laterality: Left;  . CYSTOSCOPY/URETEROSCOPY/HOLMIUM LASER/STENT PLACEMENT Left 03/09/2016   Procedure: CYSTOSCOPY/RETROGRADE PYELOGRAM/URETEROSCOPY/BASKET STONE EXTRACTION/STENT PLACEMENT;  Surgeon: Franchot Gallo, MD;  Location: [facility];  Service: Urology;  Laterality: Left;  . ESOPHAGOGASTRODUODENOSCOPY (EGD) WITH ESOPHAGEAL DILATION  x4  last one 07-21-2010  . HOLMIUM LASER  APPLICATION Left [id number]   Procedure: HOLMIUM LASER APPLICATION;  Surgeon: Franchot Gallo, MD;  Location: [facility];  Service: Urology;  Laterality: Left;  . INGUINAL HERNIA REPAIR Left yrs ago  . LUMBAR LAMINECTOMY/DECOMPRESSION MICRODISCECTOMY Left 11/09/2017   Procedure: Left Lumbar Two-Three Microdiscectomy;  Surgeon: Kristeen Miss, MD;  Location: [place];  Service: Neurosurgery;  Laterality: Left;  Left Lumbar Two-Three Microdiscectomy  . NEPHROLITHOTOMY Left 02/01/2014   Procedure: NEPHROLITHOTOMY PERCUTANEOUS;  Surgeon: Jorja Loa, MD;  Location: [facility];  Service: Urology;  Laterality: Left;  . ORIF HIP FRACTURE Left 04/16/2017   Procedure: OPEN REDUCTION INTERNAL FIXATION HIP GREATER TROCHANTER;  Surgeon: Frederik Pear, MD;  Location: [place];  Service: Orthopedics;  Laterality: Left;  . PARATHYROIDECTOMY Left 07/10/2014   Procedure: LEFT SUPERIOR PARATHYROIDECTOMY;  Surgeon: Armandina Gemma, MD;  Location: [place];  Service: General;  Laterality: Left;  . TOTAL HIP ARTHROPLASTY Left 06/24/2016   Procedure: LEFT TOTAL HIP ARTHROPLASTY ANTERIOR APPROACH;  Surgeon: Gaynelle Arabian, MD;  Location: [facility];  Service: Orthopedics;  Laterality: Left;  .  TOTAL HIP REVISION Left 11/18/2016   Procedure: Left femoral revision - posterior approach;  Surgeon: Gaynelle Arabian, MD;  Location: [facility];  Service: Orthopedics;  Laterality: Left;  . tumor ear Left age 83    topical growth behind left ear.   Family History  Problem Relation Age of Onset  . Heart disease Mother   . Alzheimer's disease Mother   . Heart disease Father   . Bladder Cancer Sister   . Colon cancer Neg Hx   . Esophageal cancer Neg Hx   . Stomach cancer Neg Hx   . Rectal cancer Neg Hx    Social History   Socioeconomic History  . Marital status: Married    Spouse name: Not on file  . Number of children: 2  Occupational History  . Occupation: real estate business  Tobacco Use  . Smoking status: Former Smoker     Years: 8.00    Quit date: 03/02/1974    Years since quitting: 44.9  . Smokeless tobacco: Never Used  Substance and Sexual Activity  . Alcohol use: Yes    Alcohol/week: 1.0 - 2.0 standard drinks    Types: 1 - 2 Glasses of wine per week    Comment: q afternoon- 11/08/2017- none in 10 days  . Drug use: No  . Sexual activity: Yes    ROS Constitutional: Denies fever, chills, weight loss/gain, headaches, insomnia,  night sweats or change in appetite. Does c/o fatigue. Eyes: Denies redness, blurred vision, diplopia, discharge, itchy or watery eyes.  ENT: Denies discharge, congestion, post nasal drip, epistaxis, sore throat, earache, hearing loss, dental pain, Tinnitus, Vertigo, Sinus pain or snoring.  Cardio: Denies chest pain, palpitations, irregular heartbeat, syncope, dyspnea, diaphoresis, orthopnea, PND, claudication or edema Respiratory: denies cough, dyspnea, DOE, pleurisy, hoarseness, laryngitis or wheezing.  Gastrointestinal: Denies dysphagia, heartburn, reflux, water brash, pain, cramps, nausea, vomiting, bloating, diarrhea, constipation, hematemesis, melena, hematochezia, jaundice or hemorrhoids Genitourinary: Denies dysuria, frequency, discharge, hematuria or flank pain. Has urgency, nocturia x 2-3 & occasional hesitancy. Musculoskeletal: Denies arthralgia, myalgia, stiffness, Jt. Swelling, pain, limp or strain/sprain. Denies Falls. Skin: Denies puritis, rash, hives, warts, acne, eczema or change in skin lesion Neuro: No weakness, tremor, incoordination, spasms, paresthesia or pain Psychiatric: Denies confusion, memory loss or sensory loss. Denies Depression. Endocrine: Denies change in weight, skin, hair change, nocturia, and paresthesia, diabetic polys, visual blurring or hyper / hypo glycemic episodes.  Heme/Lymph: No excessive bleeding, bruising or enlarged lymph nodes.  Physical Exam  BP 126/78   Pulse 64   Temp (!) 97 F (36.1 C)   Resp 18   Ht 5\' 11"  (1.803 m)   Wt 257  lb 12.8 oz (116.9 kg)   BMI 35.96 kg/m   General Appearance: Over nourished and well groomed and in no apparent distress.  Eyes: PERRLA, EOMs, conjunctiva no swelling or erythema, normal fundi and vessels. Sinuses: No frontal/maxillary tenderness ENT/Mouth: EACs patent / TMs  nl. Nares clear without erythema, swelling, mucoid exudates. Oral hygiene is good. No erythema, swelling, or exudate. Tongue normal, non-obstructing. Tonsils not swollen or erythematous. Hearing normal.  Neck: Supple, thyroid not palpable. No bruits, nodes or JVD. Respiratory: Respiratory effort normal.  BS equal and clear bilateral without rales, rhonci, wheezing or stridor. Cardio: Heart sounds are normal with regular rate and rhythm and no murmurs, rubs or gallops. Peripheral pulses are normal and equal bilaterally without edema. No aortic or femoral bruits. Chest: symmetric with normal excursions and percussion.  Abdomen: Soft, with Nl  bowel sounds. Nontender, no guarding, rebound, hernias, masses, or organomegaly.  Lymphatics: Non tender without lymphadenopathy.  Musculoskeletal: Full ROM all peripheral extremities, joint stability, 5/5 strength, and normal gait. (+) tender low left para-lumbar spasm to the left flank. Also tender over the [place]. Nl Lt hip ROM.  Skin: Warm and dry without rashes, lesions, cyanosis, clubbing or  ecchymosis.  Neuro: Cranial nerves intact, reflexes equal bilaterally. Normal muscle tone, no cerebellar symptoms. Sensation intact to touch, vibratory and Monofilament to the toes bilaterally.  Pysch: Alert and oriented X 3 with normal affect, insight and judgment appropriate.   Assessment and Plan  1. Annual Preventative/Screening Exam   2. Essential hypertension  - EKG 12-Lead - [place], retroperitnl abd,  ltd - Urinalysis, Routine w reflex microscopic - Microalbumin / Creatinine Urine Ratio - CBC with Diff - COMPLETE METABOLIC PANEL WITH GFR - Magnesium - TSH  3.  Hyperlipidemia associated with type 2 diabetes mellitus (HCC)  - EKG 12-Lead - [place], retroperitnl abd,  ltd - Lipid Profile - TSH  4. Type 2 diabetes mellitus with stage 3a chronic kidney disease, without long-term current use of insulin (HCC)  - EKG 12-Lead - [place], retroperitnl abd,  ltd - Urinalysis, Routine w reflex microscopic - Microalbumin / Creatinine Urine Ratio - HM DIABETES FOOT EXAM - LOW EXTREMITY NEUR EXAM DOCUM - Hemoglobin A1c ([facility]) - Insulin, random  5. Vitamin D deficiency  - Vitamin D (25 hydroxy)  6. Vitamin B12 deficiency  - Vitamin B12 - CBC with Diff  7. Morbidly obese ([place])   8. BPH with obstruction/lower urinary tract symptoms  - PSA  9. Prostate cancer screening  - PSA  10. Screening for colorectal cancer  - POC Hemoccult Bld/Stl   11. Screening for ischemic heart disease - EKG 12-Lead  12. FHx: heart disease  - EKG 12-Lead - [place], retroperitnl abd,  ltd  13. Former smoker  - EKG 12-Lead - [place], retroperitnl abd,  ltd  14. Screening for AAA (aortic abdominal aneurysm)  - [place], retroperitnl abd,  ltd  15. Medication management  - Urinalysis, Routine w reflex microscopic - Microalbumin / Creatinine Urine Ratio - CBC with Diff - COMPLETE METABOLIC PANEL WITH GFR - Magnesium - Lipid Profile - TSH - Hemoglobin A1c ([facility]) - Insulin, random - Vitamin D (25 hydroxy)  - patient is agreeable to a trial on TNG before meals to see if it helps his retrosternal discomfort with eating/ drinking.  - He also is agreeable to a tril with a short Prednisone pulse taper to see if it helps his LBP & hip pains.        Patient was counseled in prudent diet, weight control to achieve /maintain BMI less than 25, BP monitoring, regular exercise and medications as discussed.  Discussed med effects and SE's. Routine screening labs and tests as requested with regular follow-up as recommended. Over 40 minutes of exam, counseling, chart review and  high complex critical decision making was performed   Kirtland Bouchard, MD

## 2019-02-05 NOTE — Patient Instructions (Signed)

## 2019-02-06 ENCOUNTER — Other Ambulatory Visit: Payer: Self-pay

## 2019-02-06 ENCOUNTER — Ambulatory Visit (INDEPENDENT_AMBULATORY_CARE_PROVIDER_SITE_OTHER): Payer: Medicare Other | Admitting: Internal Medicine

## 2019-02-06 VITALS — BP 126/78 | HR 64 | Temp 97.0°F | Resp 18 | Ht 71.0 in | Wt 257.8 lb

## 2019-02-06 DIAGNOSIS — K224 Dyskinesia of esophagus: Secondary | ICD-10-CM

## 2019-02-06 DIAGNOSIS — N138 Other obstructive and reflux uropathy: Secondary | ICD-10-CM

## 2019-02-06 DIAGNOSIS — E559 Vitamin D deficiency, unspecified: Secondary | ICD-10-CM

## 2019-02-06 DIAGNOSIS — Z87891 Personal history of nicotine dependence: Secondary | ICD-10-CM | POA: Diagnosis not present

## 2019-02-06 DIAGNOSIS — Z79899 Other long term (current) drug therapy: Secondary | ICD-10-CM | POA: Diagnosis not present

## 2019-02-06 DIAGNOSIS — E1122 Type 2 diabetes mellitus with diabetic chronic kidney disease: Secondary | ICD-10-CM

## 2019-02-06 DIAGNOSIS — N1831 Chronic kidney disease, stage 3a: Secondary | ICD-10-CM | POA: Diagnosis not present

## 2019-02-06 DIAGNOSIS — E1169 Type 2 diabetes mellitus with other specified complication: Secondary | ICD-10-CM

## 2019-02-06 DIAGNOSIS — E1121 Type 2 diabetes mellitus with diabetic nephropathy: Secondary | ICD-10-CM | POA: Diagnosis not present

## 2019-02-06 DIAGNOSIS — S39012S Strain of muscle, fascia and tendon of lower back, sequela: Secondary | ICD-10-CM

## 2019-02-06 DIAGNOSIS — Z0000 Encounter for general adult medical examination without abnormal findings: Secondary | ICD-10-CM | POA: Diagnosis not present

## 2019-02-06 DIAGNOSIS — I10 Essential (primary) hypertension: Secondary | ICD-10-CM | POA: Diagnosis not present

## 2019-02-06 DIAGNOSIS — Z8249 Family history of ischemic heart disease and other diseases of the circulatory system: Secondary | ICD-10-CM

## 2019-02-06 DIAGNOSIS — Z1212 Encounter for screening for malignant neoplasm of rectum: Secondary | ICD-10-CM

## 2019-02-06 DIAGNOSIS — Z0001 Encounter for general adult medical examination with abnormal findings: Secondary | ICD-10-CM

## 2019-02-06 DIAGNOSIS — Z125 Encounter for screening for malignant neoplasm of prostate: Secondary | ICD-10-CM

## 2019-02-06 DIAGNOSIS — Z136 Encounter for screening for cardiovascular disorders: Secondary | ICD-10-CM | POA: Diagnosis not present

## 2019-02-06 DIAGNOSIS — E538 Deficiency of other specified B group vitamins: Secondary | ICD-10-CM

## 2019-02-06 DIAGNOSIS — N401 Enlarged prostate with lower urinary tract symptoms: Secondary | ICD-10-CM

## 2019-02-06 DIAGNOSIS — Z1211 Encounter for screening for malignant neoplasm of colon: Secondary | ICD-10-CM

## 2019-02-06 MED ORDER — NITROGLYCERIN 0.3 MG SL SUBL: SUBLINGUAL_TABLET | SUBLINGUAL | 0 refills | Status: DC

## 2019-02-06 MED ORDER — PREDNISONE 20 MG PO TABS: ORAL_TABLET | ORAL | 0 refills | Status: DC

## 2019-02-08 ENCOUNTER — Other Ambulatory Visit: Payer: Self-pay | Admitting: Internal Medicine

## 2019-02-08 ENCOUNTER — Telehealth: Payer: Self-pay | Admitting: *Deleted

## 2019-02-08 LAB — CBC WITH DIFFERENTIAL/PLATELET
Absolute Monocytes: 666 cells/uL (ref 200–950)
Basophils Absolute: 18 cells/uL (ref 0–200)
Basophils Relative: 0.4 %
Eosinophils Absolute: 9 cells/uL — ABNORMAL LOW (ref 15–500)
Eosinophils Relative: 0.2 %
HCT: 37.5 % — ABNORMAL LOW (ref 38.5–50.0)
Hemoglobin: 11.7 g/dL — ABNORMAL LOW (ref 13.2–17.1)
Lymphs Abs: 1130 cells/uL (ref 850–3900)
MCH: 24 pg — ABNORMAL LOW (ref 27.0–33.0)
MCHC: 31.2 g/dL — ABNORMAL LOW (ref 32.0–36.0)
MCV: 76.8 fL — ABNORMAL LOW (ref 80.0–100.0)
MPV: 10.2 fL (ref 7.5–12.5)
Monocytes Relative: 14.8 %
Neutro Abs: 2678 cells/uL (ref 1500–7800)
Neutrophils Relative %: 59.5 %
Platelets: 287 10*3/uL (ref 140–400)
RBC: 4.88 10*6/uL (ref 4.20–5.80)
RDW: 15.7 % — ABNORMAL HIGH (ref 11.0–15.0)
Total Lymphocyte: 25.1 %
WBC: 4.5 10*3/uL (ref 3.8–10.8)

## 2019-02-08 LAB — LIPID PANEL
Cholesterol: 161 mg/dL (ref <200)
HDL: 46 mg/dL (ref >=40)
LDL Cholesterol (Calc): 95 mg/dL (calc)
Non-HDL Cholesterol (Calc): 115 mg/dL (calc) (ref <130)
Total CHOL/HDL Ratio: 3.5 (calc) (ref <5.0)
Triglycerides: 104 mg/dL (ref <150)

## 2019-02-08 LAB — COMPLETE METABOLIC PANEL WITH GFR
AG Ratio: 1.3 (calc) (ref 1.0–2.5)
ALT: 16 U/L (ref 9–46)
AST: 25 U/L (ref 10–35)
Albumin: 4.1 g/dL (ref 3.6–5.1)
Alkaline phosphatase (APISO): 37 U/L (ref 35–144)
BUN/Creatinine Ratio: 14 (calc) (ref 6–22)
BUN: 23 mg/dL (ref 7–25)
CO2: 25 mmol/L (ref 20–32)
Calcium: 9.8 mg/dL (ref 8.6–10.3)
Chloride: 98 mmol/L (ref 98–110)
Creat: 1.62 mg/dL — ABNORMAL HIGH (ref 0.70–1.18)
GFR, Est African American: 47 mL/min/{1.73_m2} — ABNORMAL LOW (ref >=60)
GFR, Est Non African American: 41 mL/min/{1.73_m2} — ABNORMAL LOW (ref >=60)
Globulin: 3.2 g/dL (calc) (ref 1.9–3.7)
Glucose, Bld: 98 mg/dL (ref 65–99)
Potassium: 4.7 mmol/L (ref 3.5–5.3)
Sodium: 133 mmol/L — ABNORMAL LOW (ref 135–146)
Total Bilirubin: 0.6 mg/dL (ref 0.2–1.2)
Total Protein: 7.3 g/dL (ref 6.1–8.1)

## 2019-02-08 LAB — URINALYSIS, ROUTINE W REFLEX MICROSCOPIC
Bilirubin Urine: NEGATIVE
Glucose, UA: NEGATIVE
Hgb urine dipstick: NEGATIVE
Ketones, ur: NEGATIVE
Leukocytes,Ua: NEGATIVE
Nitrite: NEGATIVE
Protein, ur: NEGATIVE
Specific Gravity, Urine: 1.013 (ref 1.001–1.03)
pH: <=5 (ref 5.0–8.0)

## 2019-02-08 LAB — HEMOGLOBIN A1C
Hgb A1c MFr Bld: 6.7 % of total Hgb — ABNORMAL HIGH (ref <5.7)
Mean Plasma Glucose: 146 (calc)
eAG (mmol/L): 8.1 (calc)

## 2019-02-08 LAB — PSA: PSA: 1.9 ng/mL (ref <=4.0)

## 2019-02-08 LAB — INSULIN, RANDOM: Insulin: 16.9 u[IU]/mL

## 2019-02-08 LAB — TSH: TSH: 1.88 mIU/L (ref 0.40–4.50)

## 2019-02-08 LAB — MICROALBUMIN / CREATININE URINE RATIO
Creatinine, Urine: 84 mg/dL (ref 20–320)
Microalb Creat Ratio: 6 mcg/mg creat (ref <30)
Microalb, Ur: 0.5 mg/dL

## 2019-02-08 LAB — MAGNESIUM: Magnesium: 1.5 mg/dL (ref 1.5–2.5)

## 2019-02-08 LAB — VITAMIN B12: Vitamin B-12: 464 pg/mL (ref 200–1100)

## 2019-02-08 LAB — VITAMIN D 25 HYDROXY (VIT D DEFICIENCY, FRACTURES): Vit D, 25-Hydroxy: 88 ng/mL (ref 30–100)

## 2019-02-08 MED ORDER — AZITHROMYCIN 250 MG PO TABS: ORAL_TABLET | ORAL | 1 refills | Status: DC

## 2019-02-08 NOTE — Telephone Encounter (Signed)
Patient called and complained of congestion and cough. Dr Melford Aase sent an RX for a Z-pak to his pharmacy.  Patient is aware.

## 2019-02-21 ENCOUNTER — Ambulatory Visit: Payer: Self-pay | Admitting: Adult Health

## 2019-04-17 DIAGNOSIS — M9905 Segmental and somatic dysfunction of pelvic region: Secondary | ICD-10-CM | POA: Diagnosis not present

## 2019-04-17 DIAGNOSIS — M25552 Pain in left hip: Secondary | ICD-10-CM | POA: Diagnosis not present

## 2019-04-17 DIAGNOSIS — M5136 Other intervertebral disc degeneration, lumbar region: Secondary | ICD-10-CM | POA: Diagnosis not present

## 2019-04-17 DIAGNOSIS — M9903 Segmental and somatic dysfunction of lumbar region: Secondary | ICD-10-CM | POA: Diagnosis not present

## 2019-04-18 DIAGNOSIS — M5136 Other intervertebral disc degeneration, lumbar region: Secondary | ICD-10-CM | POA: Diagnosis not present

## 2019-04-18 DIAGNOSIS — M25552 Pain in left hip: Secondary | ICD-10-CM | POA: Diagnosis not present

## 2019-04-18 DIAGNOSIS — M9905 Segmental and somatic dysfunction of pelvic region: Secondary | ICD-10-CM | POA: Diagnosis not present

## 2019-04-18 DIAGNOSIS — M9903 Segmental and somatic dysfunction of lumbar region: Secondary | ICD-10-CM | POA: Diagnosis not present

## 2019-04-24 DIAGNOSIS — M9903 Segmental and somatic dysfunction of lumbar region: Secondary | ICD-10-CM | POA: Diagnosis not present

## 2019-04-24 DIAGNOSIS — M9905 Segmental and somatic dysfunction of pelvic region: Secondary | ICD-10-CM | POA: Diagnosis not present

## 2019-04-24 DIAGNOSIS — M5136 Other intervertebral disc degeneration, lumbar region: Secondary | ICD-10-CM | POA: Diagnosis not present

## 2019-04-24 DIAGNOSIS — M25552 Pain in left hip: Secondary | ICD-10-CM | POA: Diagnosis not present

## 2019-05-03 ENCOUNTER — Other Ambulatory Visit: Payer: Self-pay | Admitting: Internal Medicine

## 2019-05-03 DIAGNOSIS — M9903 Segmental and somatic dysfunction of lumbar region: Secondary | ICD-10-CM | POA: Diagnosis not present

## 2019-05-03 DIAGNOSIS — M25552 Pain in left hip: Secondary | ICD-10-CM | POA: Diagnosis not present

## 2019-05-03 DIAGNOSIS — M5136 Other intervertebral disc degeneration, lumbar region: Secondary | ICD-10-CM | POA: Diagnosis not present

## 2019-05-03 DIAGNOSIS — M9905 Segmental and somatic dysfunction of pelvic region: Secondary | ICD-10-CM | POA: Diagnosis not present

## 2019-05-08 ENCOUNTER — Other Ambulatory Visit: Payer: Self-pay

## 2019-05-08 DIAGNOSIS — Z1211 Encounter for screening for malignant neoplasm of colon: Secondary | ICD-10-CM

## 2019-05-08 DIAGNOSIS — Z1212 Encounter for screening for malignant neoplasm of rectum: Secondary | ICD-10-CM

## 2019-05-08 LAB — POC HEMOCCULT BLD/STL (HOME/3-CARD/SCREEN)
Card #2 Fecal Occult Blod, POC: NEGATIVE
Card #3 Fecal Occult Blood, POC: NEGATIVE
Fecal Occult Blood, POC: NEGATIVE

## 2019-05-09 ENCOUNTER — Ambulatory Visit: Payer: Self-pay | Admitting: Adult Health

## 2019-05-09 ENCOUNTER — Ambulatory Visit: Payer: Self-pay | Admitting: Adult Health Nurse Practitioner

## 2019-05-09 DIAGNOSIS — M9903 Segmental and somatic dysfunction of lumbar region: Secondary | ICD-10-CM | POA: Diagnosis not present

## 2019-05-09 DIAGNOSIS — M25552 Pain in left hip: Secondary | ICD-10-CM | POA: Diagnosis not present

## 2019-05-09 DIAGNOSIS — M5136 Other intervertebral disc degeneration, lumbar region: Secondary | ICD-10-CM | POA: Diagnosis not present

## 2019-05-09 DIAGNOSIS — Z1211 Encounter for screening for malignant neoplasm of colon: Secondary | ICD-10-CM | POA: Diagnosis not present

## 2019-05-09 DIAGNOSIS — Z1212 Encounter for screening for malignant neoplasm of rectum: Secondary | ICD-10-CM | POA: Diagnosis not present

## 2019-05-09 DIAGNOSIS — M9905 Segmental and somatic dysfunction of pelvic region: Secondary | ICD-10-CM | POA: Diagnosis not present

## 2019-05-11 DIAGNOSIS — M25552 Pain in left hip: Secondary | ICD-10-CM | POA: Diagnosis not present

## 2019-05-11 DIAGNOSIS — M5136 Other intervertebral disc degeneration, lumbar region: Secondary | ICD-10-CM | POA: Diagnosis not present

## 2019-05-11 DIAGNOSIS — M9903 Segmental and somatic dysfunction of lumbar region: Secondary | ICD-10-CM | POA: Diagnosis not present

## 2019-05-11 DIAGNOSIS — M9905 Segmental and somatic dysfunction of pelvic region: Secondary | ICD-10-CM | POA: Diagnosis not present

## 2019-05-18 DIAGNOSIS — M25552 Pain in left hip: Secondary | ICD-10-CM | POA: Diagnosis not present

## 2019-05-22 DIAGNOSIS — M533 Sacrococcygeal disorders, not elsewhere classified: Secondary | ICD-10-CM | POA: Diagnosis not present

## 2019-05-23 DIAGNOSIS — M9905 Segmental and somatic dysfunction of pelvic region: Secondary | ICD-10-CM | POA: Diagnosis not present

## 2019-05-23 DIAGNOSIS — M9903 Segmental and somatic dysfunction of lumbar region: Secondary | ICD-10-CM | POA: Diagnosis not present

## 2019-05-23 DIAGNOSIS — M5136 Other intervertebral disc degeneration, lumbar region: Secondary | ICD-10-CM | POA: Diagnosis not present

## 2019-05-23 DIAGNOSIS — M25552 Pain in left hip: Secondary | ICD-10-CM | POA: Diagnosis not present

## 2019-05-24 DIAGNOSIS — M533 Sacrococcygeal disorders, not elsewhere classified: Secondary | ICD-10-CM | POA: Diagnosis not present

## 2019-05-25 ENCOUNTER — Encounter: Payer: Self-pay | Admitting: Gastroenterology

## 2019-05-25 ENCOUNTER — Ambulatory Visit: Payer: Medicare Other | Admitting: Gastroenterology

## 2019-05-25 VITALS — BP 132/70 | HR 64 | Temp 97.3°F | Ht 71.0 in | Wt 257.0 lb

## 2019-05-25 DIAGNOSIS — R1310 Dysphagia, unspecified: Secondary | ICD-10-CM | POA: Diagnosis not present

## 2019-05-25 DIAGNOSIS — K224 Dyskinesia of esophagus: Secondary | ICD-10-CM

## 2019-05-25 DIAGNOSIS — K219 Gastro-esophageal reflux disease without esophagitis: Secondary | ICD-10-CM

## 2019-05-25 MED ORDER — HYOSCYAMINE SULFATE 0.125 MG SL SUBL: 0.1250 mg | SUBLINGUAL_TABLET | Freq: Every day | SUBLINGUAL | 1 refills | Status: DC | PRN

## 2019-05-25 MED ORDER — DEXILANT 60 MG PO CPDR: 60.0000 mg | DELAYED_RELEASE_CAPSULE | Freq: Every day | ORAL | 3 refills | Status: DC

## 2019-05-25 NOTE — Progress Notes (Signed)
[name] [name]    [id number]    [birth year]  Primary Care Physician:[name] [name], [name] Saxon, MD  Referring Physician: Unk Pinto, MD [address] [name] [name],  [name] [name] [name] [name]   Chief complaint: Dysphagia HPI: 79 year old male here for follow-up visit for chronic dysphagia  He is taking Protonix twice daily, does not always take it before meals on most days he forgets and takes it together.  He wakes up with dry mouth most mornings and starts coughing incessantly.  Also has coughing with some meals. His primary doctor prescribed him sublingual nitroglycerin to see if it will help him with this esophageal spasms.  He is hesitant to take it He noticed worsening coughing and spasms when he eats chocolate but not always.  He drinks 1 to 2 cups of coffee in the morning and 1 cup of coffee after dinner  EGD November 17, 2018: Normal Z-line.  Normal esophagus for dysphagia, biopsies done negative for EOE.  Empiric dilation with [name] [name].  Barium esophagram January 14, 2017: Small hiatal hernia otherwise unremarkable exam.  Colonoscopy February 12, 2017: Multiple  >colon polyps removed, largest 14 mm in size.  Tubular adenoma.  Recall colonoscopy in 3 years  EGDs most recent Jul 21, 2010 with distal esophageal stricture dilated to 18 mm with Maloney, no heme or resistance.   Colonoscopy in December 2006 showed left-sided diverticulosis and diminutive hyperplastic rectal polyp  Outpatient Encounter Medications as of 05/25/2019  Medication Sig  . aspirin EC 81 MG tablet Take 81 mg by mouth daily.  Marland Kitchen b complex vitamins tablet Take 1 tablet by mouth daily.  . bisoprolol-hydrochlorothiazide (ZIAC) 5-6.25 MG tablet Take 1 tablet Daily for BP  . Cholecalciferol (VITAMIN D PO) Take 5,000 Units by mouth daily.   Marland Kitchen erythromycin ophthalmic ointment Apply 1 cm ribbon in affected eye(s) up to 2 times daily for 7 days  . fenofibrate (TRICOR) 145 MG tablet Take 1  tablet Daily for Triglycerides (Blood Fats)  . finasteride (PROSCAR) 5 MG tablet Take 5 mg by mouth daily.  . Flaxseed, Linseed, (FLAX SEEDS PO) Take 1,000 mg by mouth daily.   Marland Kitchen losartan (COZAAR) 100 MG tablet TAKE 1 TABLET BY MOUTH DAILY FOR BLOOD PRESSURE  . Magnesium Oxide 250 MG TABS Take 250 mg by mouth 2 (two) times daily.   . meloxicam (MOBIC) 15 MG tablet TAKE 1/2 TO 1 TABLET DAILY WITH FOOD FOR PAIN & INFLAMMATION  . nitroGLYCERIN (NITROSTAT) 0.3 MG SL tablet Dissolve 1 tablet 3 x /day under tongue Before Meals for Esophageal Spasm  . Omega-3 Fatty Acids (FISH OIL) 1200 MG CAPS Take 1,200 mg by mouth 2 (two) times daily.   . pantoprazole (PROTONIX) 40 MG tablet TAKE 1 TABLET BY MOUTH TWICE A DAY.  Marland Kitchen pseudoephedrine-acetaminophen (TYLENOL SINUS) 30-500 MG TABS tablet Take 1 tablet by mouth every 4 (four) hours as needed.  . tamsulosin (FLOMAX) 0.4 MG CAPS capsule Take 0.4 mg by mouth daily after breakfast.   . vitamin B-12 (CYANOCOBALAMIN) 1000 MCG tablet Take 1 tablet (1,000 mcg total) by mouth daily.  . [DISCONTINUED] azithromycin (ZITHROMAX) 250 MG tablet Take 2 tablets with Food on  Day 1, then 1 tablet Daily with Food for Infection   No facility-administered encounter medications on file as of 05/25/2019.    Allergies as of 05/25/2019 - Review Complete 05/25/2019  Allergen Reaction Noted  . Iodinated diagnostic agents Shortness Of Breath   . Iohexol  Shortness Of Breath 07/11/2010    Past Medical History:  Diagnosis Date  . Arthritis    in left hip  . ED (erectile dysfunction)   . Enlarged prostate with lower urinary tract symptoms (LUTS)   . GERD (gastroesophageal reflux disease)   . History of chronic gastritis   . History of colon polyps    hyperplastic 2006  . History of esophageal stricture    S/P  DILATATION 2009; 2010; 2010 2012  . History of kidney stones    hx. multiple kidney stones  . History of kidney stones   . History of primary hyperparathyroidism     s/p  left superior parathyroidectomy 07-10-2014  . Hyperlipidemia   . Hypertension   . Ileus ([name] [name]) 05/02/2017  . Left ureteral stone   . Pre-diabetes   . Sepsis secondary to UTI ([name] [name]) 05/02/2017    Past Surgical History:  Procedure Laterality Date  . BACK SURGERY  04/2018   fusion L2 and L3  . COLONOSCOPY  02/17/2005  . CYSTO/  LEFT RETROGRADE PYELOGRAM/ STENT PLACEMENT  01/26/2005  . CYSTOSCOPY W/ URETEROSCOPY W/ LITHOTRIPSY Left 05/04/2005  . CYSTOSCOPY WITH STENT PLACEMENT Left 12/30/2015   Procedure: CYSTOSCOPY WITH STENT PLACEMENT;  Surgeon: Franchot Gallo, MD;  Location: [facility];  Service: Urology;  Laterality: Left;  . CYSTOSCOPY/RETROGRADE/URETEROSCOPY/STONE EXTRACTION WITH BASKET Left 12/30/2015   Procedure: CYSTOSCOPY/RETROGRADE/URETEROSCOPY/STONE EXTRACTION WITH BASKET;  Surgeon: Franchot Gallo, MD;  Location[facility];  Service: Urology;  Laterality: Left;  . CYSTOSCOPY/URETEROSCOPY/HOLMIUM LASER/STENT PLACEMENT Left 03/09/2016   Procedure: CYSTOSCOPY/RETROGRADE PYELOGRAM/URETEROSCOPY/BASKET STONE EXTRACTION/STENT PLACEMENT;  Surgeon: Franchot Gallo, MD;  Location: [facility];  Service: Urology;  Laterality: Left;  . ESOPHAGOGASTRODUODENOSCOPY (EGD) WITH ESOPHAGEAL DILATION  x4  last one 07-21-2010  . HOLMIUM LASER APPLICATION Left [id number]   Procedure: HOLMIUM LASER APPLICATION;  Surgeon: Franchot Gallo, MD;  Location: [facility];  Service: Urology;  Laterality: Left;  . INGUINAL HERNIA REPAIR Left yrs ago  . LUMBAR LAMINECTOMY/DECOMPRESSION MICRODISCECTOMY Left 11/09/2017   Procedure: Left Lumbar Two-Three Microdiscectomy;  Surgeon: Kristeen Miss, MD;  Location: [place];  Service: Neurosurgery;  Laterality: Left;  Left Lumbar Two-Three Microdiscectomy  . NEPHROLITHOTOMY Left 02/01/2014   Procedure: NEPHROLITHOTOMY PERCUTANEOUS;  Surgeon: Jorja Loa, MD;  Location: [facility];  Service: Urology;  Laterality: Left;  .  ORIF HIP FRACTURE Left 04/16/2017   Procedure: OPEN REDUCTION INTERNAL FIXATION HIP GREATER TROCHANTER;  Surgeon: Frederik Pear, MD;  Location: [place];  Service: Orthopedics;  Laterality: Left;  . PARATHYROIDECTOMY Left 07/10/2014   Procedure: LEFT SUPERIOR PARATHYROIDECTOMY;  Surgeon: Armandina Gemma, MD;  Location: [place];  Service: General;  Laterality: Left;  . TOTAL HIP ARTHROPLASTY Left 06/24/2016   Procedure: LEFT TOTAL HIP ARTHROPLASTY ANTERIOR APPROACH;  Surgeon: Gaynelle Arabian, MD;  Location: [facility];  Service: Orthopedics;  Laterality: Left;  . TOTAL HIP REVISION Left 11/18/2016   Procedure: Left femoral revision - posterior approach;  Surgeon: Gaynelle Arabian, MD;  Location: [facility];  Service: Orthopedics;  Laterality: Left;  . tumor ear Left age 37    topical growth behind left ear.    Family History  Problem Relation Age of Onset  . Heart disease Mother   . Alzheimer's disease Mother   . Heart disease Father   . Bladder Cancer Sister   . Colon cancer Neg Hx   . Esophageal cancer Neg Hx   . Stomach cancer Neg Hx   . Rectal cancer Neg Hx  Social History   Socioeconomic History  . Marital status: Married    Spouse name: Not on file  . Number of children: 2  . Years of education: Not on file  . Highest education level: Not on file  Occupational History  . Occupation: real estate business  Tobacco Use  . Smoking status: Former Smoker    Years: 8.00    Quit date: 03/02/1974    Years since quitting: 45.2  . Smokeless tobacco: Never Used  Substance and Sexual Activity  . Alcohol use: Yes    Alcohol/week: 1.0 - 2.0 standard drinks    Types: 1 - 2 Glasses of wine per week    Comment: q afternoon- 11/08/2017- none in 10 days  . Drug use: No  . Sexual activity: Yes  Other Topics Concern  . Not on file  Social History Narrative  . Not on file   Social Determinants of Health   Financial Resource Strain:   . Difficulty of Paying Living Expenses:   Food Insecurity:   . Worried  About Charity fundraiser in the Last Year:   . Arboriculturist in the Last Year:   Transportation Needs:   . Film/video editor (Medical):   Marland Kitchen Lack of Transportation (Non-Medical):   Physical Activity:   . Days of Exercise per Week:   . Minutes of Exercise per Session:   Stress:   . Feeling of Stress :   Social Connections:   . Frequency of Communication with Friends and Family:   . Frequency of Social Gatherings with Friends and Family:   . Attends Religious Services:   . Active Member of Clubs or Organizations:   . Attends Archivist Meetings:   Marland Kitchen Marital Status:   Intimate Partner Violence:   . Fear of Current or Ex-Partner:   . Emotionally Abused:   Marland Kitchen Physically Abused:   . Sexually Abused:       Review of systems:  All other review of systems negative except as mentioned in the HPI.   Physical Exam: Vitals:   05/25/19 1413  BP: 132/70  Pulse: 64  Temp: (!) 97.3 F (36.3 C)   Body mass index is 35.84 kg/m. Gen:      No acute distress Neuro: alert and oriented x 3 Psych: normal mood and affect  Data Reviewed:  Reviewed labs, radiology imaging, old records and pertinent past GI work up   Assessment and Plan/Recommendations:  79 year old male with history of hiatal hernia, GERD with chronic dysphagia  He had barium esophagram and multiple EGD with dilation.  Most recent EGD and barium study negative for significant stricture or dysmotility  Possible esophageal spasm secondary to uncontrolled GERD Switch to Dexilant 60 mg daily.  Discontinue Protonix Advised patient to avoid drinking any caffeinated drinks in the evening Discussed lifestyle modifications and antireflux measures in detail  Use sublingual hyoscyamine as needed for severe esophageal spasms. Advised patient to avoid taking nitroglycerin along with Viagra, Cialis or sildenafil due to potential life-threatening drug interactions  Return in 2 to 3 months  This visit required  35 minutes of patient care (this includes precharting, chart review, review of results, face-to-face time used for counseling as well as treatment plan and follow-up. The patient was provided an opportunity to ask questions and all were answered. The patient agreed with the plan and demonstrated an understanding of the instructions.  Damaris Hippo , MD    CC: [name] Pinto, MD

## 2019-05-25 NOTE — Patient Instructions (Addendum)
We have sent the following medications to your pharmacy for you to pick up at your convenience: Dexilant , Levsin  Stop Protonix.   Start Dexilant once daily.   See attached Anti reflux measures.   Avoid drinking any caffeinated drinks in the evening.   Thank you for choosing me and [facility].     Food Choices for Gastroesophageal Reflux Disease, Adult When you have gastroesophageal reflux disease (GERD), the foods you eat and your eating habits are very important. Choosing the right foods can help ease your discomfort. Think about working with a nutrition specialist (dietitian) to help you make good choices. What are tips for following this plan?  Meals  Choose healthy foods that are low in fat, such as fruits, vegetables, whole grains, low-fat dairy products, and lean meat, fish, and poultry.  Eat small meals often instead of 3 large meals a day. Eat your meals slowly, and in a place where you are relaxed. Avoid bending over or lying down until 2-3 hours after eating.  Avoid eating meals 2-3 hours before bed.  Avoid drinking a lot of liquid with meals.  Cook foods using methods other than frying. Bake, grill, or broil food instead.  Avoid or limit: ? Chocolate. ? Peppermint or spearmint. ? Alcohol. ? Pepper. ? Black and decaffeinated coffee. ? Black and decaffeinated tea. ? Bubbly (carbonated) soft drinks. ? Caffeinated energy drinks and soft drinks.  Limit high-fat foods such as: ? Fatty meat or fried foods. ? Whole milk, cream, butter, or ice cream. ? Nuts and nut butters. ? Pastries, donuts, and sweets made with butter or shortening.  Avoid foods that cause symptoms. These foods may be different for everyone. Common foods that cause symptoms include: ? Tomatoes. ? Oranges, lemons, and limes. ? Peppers. ? Spicy food. ? Onions and garlic. ? Vinegar. Lifestyle  Maintain a healthy weight. Ask your doctor what weight is healthy for you. If you  need to lose weight, work with your doctor to do so safely.  Exercise for at least 30 minutes for 5 or more days each week, or as told by your doctor.  Wear loose-fitting clothes.  Do not smoke. If you need help quitting, ask your doctor.  Sleep with the head of your bed higher than your feet. Use a wedge under the mattress or blocks under the bed frame to raise the head of the bed. Summary  When you have gastroesophageal reflux disease (GERD), food and lifestyle choices are very important in easing your symptoms.  Eat small meals often instead of 3 large meals a day. Eat your meals slowly, and in a place where you are relaxed.  Limit high-fat foods such as fatty meat or fried foods.  Avoid bending over or lying down until 2-3 hours after eating.  Avoid peppermint and spearmint, caffeine, alcohol, and chocolate. This information is not intended to replace advice given to you by your health care provider. Make sure you discuss any questions you have with your health care provider. Document Revised: 06/09/2018 Document Reviewed: 03/24/2016 Elsevier Patient Education  2020 [place] you for choosing me and [facility].  Dr. Silverio Decamp

## 2019-05-30 ENCOUNTER — Telehealth: Payer: Self-pay | Admitting: Gastroenterology

## 2019-05-30 NOTE — Telephone Encounter (Signed)
Spoke with [name] [name]. He understands about the cost of the [place]. He did not know what the Levsin was to be used for because he is not having stomach cramps. The pharmacist had told him that was what it was to be used for. Read through his recent office visit. Advised the patient that he has been given Levsin for esophageal spasms if he were to need it.

## 2019-05-30 NOTE — Telephone Encounter (Signed)
Called back. No answer. Left message on the voicemail to call back. Is she calling about Dexilant refill? Refilled 05/25/19. I called the [facility]. The patient's insurance has a deductible for the prescription medications. A ten day supply of Dexilant costs the patient about $83 dollars. It is a deductible, not a prior authorization. Pharmacist explained it to the patient.  Is he asking for samples?

## 2019-06-12 ENCOUNTER — Telehealth: Payer: Self-pay | Admitting: *Deleted

## 2019-06-12 NOTE — Telephone Encounter (Signed)
Patient called with concerns regarding his BP readings. He reported BP reading of 116/66 last PM and reduced his Losartan 100 mg to 1/2 tablet. His BP this morning was 127/74. He is also concerned about his pulse rate being in the upper 40's. Per Dr Melford Aase, he can reduce the Losartan to 1/2 tablet at bedtime and the Ziac 5-6.25 to 1/2 tablet in the mornings, since his pulse is low. He was advised to continue to monitor his pressure and pulse and can call with any questions.

## 2019-06-15 ENCOUNTER — Telehealth: Payer: Self-pay | Admitting: *Deleted

## 2019-06-15 NOTE — Telephone Encounter (Signed)
Spouse called to confirm instructions given to patient concerning BP medication. Patient was advised, on 06/12/2019, to reduce Ziac 5-6.25 to 1/2 tablet in the morning and Losartan 100 mg to 1/2 tablet in the PM, continue to monitor BP and call with concerns. Spouse advised of all.

## 2019-08-11 DIAGNOSIS — R7309 Other abnormal glucose: Secondary | ICD-10-CM | POA: Insufficient documentation

## 2019-08-11 NOTE — Progress Notes (Deleted)
MEDICARE ANNUAL WELLNESS VISIT AND FOLLOW UP Assessment:    Medicare annual wellness visit, subsequent   Essential hypertension - continue medications, DASH diet, exercise and monitor at home. Call if greater than 130/80.  - CBC with Differential/Platelet - CMP/GFR - TSH   Morbid obesity, unspecified obesity type ([place]) Long discussion about weight loss, diet, and exercise Discussed final goal weight  Patient didn't tolerate topamax, willing to retry phentermine; continue close follow up. Return in 3 months HAS NOT HAD SLEEP STUDY YET BUT VERY LIKELY HAS OSA GET SLEEP STUDY - declines at this time, has been too busy with hip surgery  Hyperparathyroidism, primary ([place]) - CMP/GFR  Prediabetes/borderline diabetic Working aggressively on weight loss  Discussed disease and risks Discussed diet/exercise, weight management  - Hemoglobin A1c  Hyperlipidemia -continue medications, check lipids, decrease fatty foods, increase activity.  - Lipid panel  Medication management - Magnesium  Vitamin D deficiency - VITAMIN D 25 Hydroxy (Vit-D Deficiency, Fractures)   Kidney stones Lifestyle discussed, push fluids  CMP/GFR   Stricture and stenosis of esophagus Continue PPI  Gastroesophageal reflux disease, esophagitis presence not specified Continue PPI  CKD (chronic kidney disease) stage 3, GFR 30-59 ml/min Increase fluids, avoid NSAIDS, monitor sugars, will monitor - CMP/GFR  Hip osteoarthritis S/p L hip arthroplasty, follow up ortho PRN, continue PT and exercises   DEFER ALL LABS TODAY AS JUST HAD IN NOVEMBER  Over 30 minutes of exam, counseling, chart review, and critical decision making was performed  Future Appointments  Date Time Provider [place]  08/14/2019 11:45 AM Vicie Mutters, PA-C [facility] None  03/04/2020  2:00 PM Unk Pinto, MD [facility] None     Plan:   During the course of the visit the patient was educated and counseled about  appropriate screening and preventive services including:    Pneumococcal vaccine   Influenza vaccine  Prevnar 13  Td vaccine  Screening electrocardiogram  Colorectal cancer screening  Diabetes screening  Glaucoma screening  Nutrition counseling    Subjective:  [name] [name] is a 79 y.o. male who presents for Medicare Annual Wellness Visit.  He has had multiple surgeries on L hip for failed arthroplasty in the past 1-2 years, now working with PT twice a week but still with antalgic gait and walking with cane. Ortho unfortunately suspects this will be permanent. He reports pain is minimal and manageable but cannot walk miles daily as he used to.   he is prescribed phentermine/topamax for weight loss at last visit. He report he experienced severe sedation and stopped both medications after 2 days. While on the medication they have lost 3 lbs since last visit. They deny palpitations, anxiety, trouble sleeping, elevated BP.   BMI is There is no height or weight on file to calculate BMI., he is working on diet and exercise. He reports quantity is very limited, but admits to high sugar/starch intake - 1/2 bagel with jam, glass of juice, etc.  Wt Readings from Last 3 Encounters:  05/25/19 257 lb (116.6 kg)  02/06/19 257 lb 12.8 oz (116.9 kg)  12/14/18 259 lb 9.6 oz (117.8 kg)   His blood pressure has been controlled at home, today their BP is   He does not workout. He denies chest pain, shortness of breath, dizziness.   Has chronic GERD, on PPI, has had esophagus stretched 3 times.  Had cystoscopy for stone/stent placement in Oct 2018 by Dr. Diona Fanti  He is on cholesterol medication and has some myalgias with fenofibrate.  His cholesterol is at goal. The cholesterol last visit was:   Lab Results  Component Value Date   CHOL 161 02/06/2019   HDL 46 02/06/2019   LDLCALC 95 02/06/2019   TRIG 104 02/06/2019   CHOLHDL 3.5 02/06/2019   He has been working on diet and exercise for  prediabetes/T2DM, and denies paresthesia of the feet, polydipsia, polyuria and visual disturbances. Last A1C in the office was:  Lab Results  Component Value Date   HGBA1C 6.7 (H) 02/06/2019   Last GFR  Lab Results  Component Value Date   GFRNONAA 41 (L) 02/06/2019   Patient is on Vitamin D supplement.   Lab Results  Component Value Date   VD25OH 88 02/06/2019        Medication Review: Current Outpatient Medications on File Prior to Visit  Medication Sig Dispense Refill  . aspirin EC 81 MG tablet Take 81 mg by mouth daily.    Marland Kitchen b complex vitamins tablet Take 1 tablet by mouth daily.    . bisoprolol-hydrochlorothiazide (ZIAC) 5-6.25 MG tablet Take 1 tablet Daily for BP 90 tablet 3  . Cholecalciferol (VITAMIN D PO) Take 5,000 Units by mouth daily.     Marland Kitchen dexlansoprazole (DEXILANT) 60 MG capsule Take 1 capsule (60 mg total) by mouth daily. 90 capsule 3  . erythromycin ophthalmic ointment Apply 1 cm ribbon in affected eye(s) up to 2 times daily for 7 days 1 g 1  . fenofibrate (TRICOR) 145 MG tablet Take 1 tablet Daily for Triglycerides (Blood Fats) 90 tablet 3  . finasteride (PROSCAR) 5 MG tablet Take 5 mg by mouth daily.    . Flaxseed, Linseed, (FLAX SEEDS PO) Take 1,000 mg by mouth daily.     . hyoscyamine (LEVSIN SL) 0.125 MG SL tablet Place 1 tablet (0.125 mg total) under the tongue daily as needed. 30 tablet 1  . losartan (COZAAR) 100 MG tablet TAKE 1 TABLET BY MOUTH DAILY FOR BLOOD PRESSURE 90 tablet 3  . Magnesium Oxide 250 MG TABS Take 250 mg by mouth 2 (two) times daily.     . meloxicam (MOBIC) 15 MG tablet TAKE 1/2 TO 1 TABLET DAILY WITH FOOD FOR PAIN & INFLAMMATION 90 tablet 1  . nitroGLYCERIN (NITROSTAT) 0.3 MG SL tablet Dissolve 1 tablet 3 x /day under tongue Before Meals for Esophageal Spasm 25 tablet 0  . Omega-3 Fatty Acids (FISH OIL) 1200 MG CAPS Take 1,200 mg by mouth 2 (two) times daily.     . pseudoephedrine-acetaminophen (TYLENOL SINUS) 30-500 MG TABS tablet Take 1  tablet by mouth every 4 (four) hours as needed.    . tamsulosin (FLOMAX) 0.4 MG CAPS capsule Take 0.4 mg by mouth daily after breakfast.   3  . vitamin B-12 (CYANOCOBALAMIN) 1000 MCG tablet Take 1 tablet (1,000 mcg total) by mouth daily. 30 tablet 0   No current facility-administered medications on file prior to visit.    Current Problems (verified) Patient Active Problem List   Diagnosis Date Noted  . Herniated nucleus pulposus, L2-3 left 11/09/2017  . Normocytic anemia 05/02/2017  . Vitamin B12 deficiency 05/02/2017  . OA (osteoarthritis) of hip 06/24/2016  . CKD (chronic kidney disease) stage 3, GFR 30-59 ml/min ([facility]) 03/25/2015  . Snoring 03/25/2015  . Encounter for Medicare annual wellness exam 11/27/2014  . Hyperparathyroidism, primary ([place]) 07/09/2014  . Vitamin D deficiency 06/13/2013  . Prediabetes 06/13/2013  . Medication management 06/13/2013  . Hyperlipidemia   . Hypertension   . GERD (gastroesophageal  reflux disease)   . Kidney stones   . Morbidly obese ([place])   . Stricture and stenosis of esophagus 10/18/2008    Screening Tests Immunization History  Administered Date(s) Administered  . Influenza Split 12/27/2012  . Influenza, High Dose Seasonal PF 01/31/2014, 11/27/2014, 01/13/2018  . Influenza-Unspecified 01/12/2017  . Pneumococcal Conjugate-13 03/25/2015  . Pneumococcal Polysaccharide-23 03/02/2008  . Td 03/02/2005, 09/24/2015  . Zoster 03/02/2006   Preventative care: Last colonoscopy: 01/2017 EGD 2012 MGM 2013  Prior vaccinations: TD or Tdap: 2017 Influenza: 2019  Pneumococcal: 2010 Prevnar13: 2017 Shingles/Zostavax: 2008  Names of Other Physician/Practitioners you currently use: 1. [facility] here for primary care 2. Dr. Katy Fitch, eye doctor, last visit 2019, has upcoming cataract  3. Dr. Altamese , dentist, last visit 2019  Patient Care Team: Unk Pinto, MD as PCP - General (Internal Medicine) Newt Minion, MD as Consulting Physician (Orthopedic Surgery) Inda Castle, MD (Inactive) as Consulting Physician (Gastroenterology) Franchot Gallo, MD as Consulting Physician (Urology)  Allergies Allergies  Allergen Reactions  . Iodinated Diagnostic Agents Shortness Of Breath  . Iohexol Shortness Of Breath    SURGICAL HISTORY He  has a past surgical history that includes tumor ear (Left, age 66); Cystoscopy w/ ureteroscopy w/ lithotripsy (Left, 05/04/2005); Colonoscopy (02/17/2005); Nephrolithotomy (Left, 02/01/2014); Parathyroidectomy (Left, 07/10/2014); CYSTO/  LEFT RETROGRADE PYELOGRAM/ STENT PLACEMENT (01/26/2005); Esophagogastroduodenoscopy (egd) with esophageal dilation (x4  last one 07-21-2010); Inguinal hernia repair (Left, yrs ago); Cystoscopy/retrograde/ureteroscopy/stone extraction with basket (Left, 12/30/2015); Cystoscopy with stent placement (Left, 12/30/2015); Holmium laser application (Left, 61/60/7371); Cystoscopy/ureteroscopy/holmium laser/stent placement (Left, 03/09/2016); Total hip arthroplasty (Left, 06/24/2016); Total hip revision (Left, 11/18/2016); ORIF hip fracture (Left, 04/16/2017); Lumbar laminectomy/decompression microdiscectomy (Left, 11/09/2017); and Back surgery (04/2018). FAMILY HISTORY His family history includes Alzheimer's disease in his mother; Bladder Cancer in his sister; Heart disease in his father and mother. SOCIAL HISTORY He  reports that he quit smoking about 45 years ago. He quit after 8.00 years of use. He has never used smokeless tobacco. He reports current alcohol use of about 1.0 - 2.0 standard drink of alcohol per week. He reports that he does not use drugs.  MEDICARE WELLNESS OBJECTIVES: Physical activity:   Cardiac risk factors:   Depression/mood screen:   Depression screen [facility] 2/9 02/05/2019  Decreased Interest 0  Down, Depressed, Hopeless 0  PHQ - 2 Score 0    ADLs:  In your present state of health, do you have any difficulty performing  the following activities: 02/05/2019  Hearing? N  Vision? N  Difficulty concentrating or making decisions? N  Walking or climbing stairs? N  Dressing or bathing? N  Doing errands, shopping? N  Some recent data might be hidden     Cognitive Testing  Alert? Yes  Normal Appearance?Yes  Oriented to person? Yes  Place? Yes   Time? Yes  Recall of three objects?  Yes  Can perform simple calculations? Yes  Displays appropriate judgment?Yes  Can read the correct time from a watch face?Yes  EOL planning:     Objective:   There were no vitals filed for this visit. There is no height or weight on file to calculate BMI.  General appearance: alert, no distress, WD/WN, male HEENT: normocephalic, sclerae anicteric, TMs pearly, nares patent, no discharge or erythema, pharynx normal Oral cavity: MMM, no lesions, crowded mouth Neck: supple, large neck circumference, no lymphadenopathy, no thyromegaly, no masses Heart: RRR, normal S1, S2, no murmurs Lungs: CTA bilaterally, no wheezes, rhonchi,  or rales Abdomen: +bs, soft, obese, non tender, non distended, no masses, no hepatomegaly, no splenomegaly Musculoskeletal: nontender, no swelling, no obvious deformity, antalgic gait with cane Extremities: no edema, no cyanosis, no clubbing Pulses: 2+ symmetric, upper and lower extremities, normal cap refill Neurological: alert, oriented x 3, CN2-12 intact, strength normal upper extremities, weakness in bilateral lower extremities, sensation diminished in L thigh, no cerebellar signs, gait antalgic Psychiatric: normal affect, behavior normal, pleasant   Medicare Attestation I have personally reviewed: The patient's medical and social history Their use of alcohol, tobacco or illicit drugs Their current medications and supplements The patient's functional ability including ADLs,fall risks, home safety risks, cognitive, and hearing and visual impairment Diet and physical activities Evidence for  depression or mood disorders  The patient's weight, height, BMI, and visual acuity have been recorded in the chart.  I have made referrals, counseling, and provided education to the patient based on review of the above and I have provided the patient with a written personalized care plan for preventive services.     Vicie Mutters, PA-C   08/11/2019

## 2019-08-14 ENCOUNTER — Ambulatory Visit: Payer: Medicare Other | Admitting: Physician Assistant

## 2019-08-14 ENCOUNTER — Other Ambulatory Visit: Payer: Self-pay | Admitting: Internal Medicine

## 2019-09-05 ENCOUNTER — Other Ambulatory Visit: Payer: Self-pay | Admitting: Neurological Surgery

## 2019-09-05 ENCOUNTER — Emergency Department (HOSPITAL_COMMUNITY): Payer: Medicare Other

## 2019-09-05 ENCOUNTER — Inpatient Hospital Stay (HOSPITAL_COMMUNITY)
Admission: EM | Admit: 2019-09-05 | Discharge: 2019-09-18 | DRG: 454 | Disposition: A | Discharge status: Home / Self Care | Payer: Medicare Other | Attending: Neurological Surgery | Admitting: Neurological Surgery

## 2019-09-05 ENCOUNTER — Other Ambulatory Visit: Payer: Self-pay

## 2019-09-05 ENCOUNTER — Encounter (HOSPITAL_COMMUNITY): Payer: Self-pay

## 2019-09-05 DIAGNOSIS — Z87891 Personal history of nicotine dependence: Secondary | ICD-10-CM

## 2019-09-05 DIAGNOSIS — K219 Gastro-esophageal reflux disease without esophagitis: Secondary | ICD-10-CM | POA: Diagnosis present

## 2019-09-05 DIAGNOSIS — M4800 Spinal stenosis, site unspecified: Secondary | ICD-10-CM | POA: Diagnosis not present

## 2019-09-05 DIAGNOSIS — E782 Mixed hyperlipidemia: Secondary | ICD-10-CM | POA: Diagnosis not present

## 2019-09-05 DIAGNOSIS — Z888 Allergy status to other drugs, medicaments and biological substances status: Secondary | ICD-10-CM | POA: Diagnosis not present

## 2019-09-05 DIAGNOSIS — M25571 Pain in right ankle and joints of right foot: Secondary | ICD-10-CM | POA: Diagnosis present

## 2019-09-05 DIAGNOSIS — I130 Hypertensive heart and chronic kidney disease with heart failure and stage 1 through stage 4 chronic kidney disease, or unspecified chronic kidney disease: Secondary | ICD-10-CM | POA: Diagnosis present

## 2019-09-05 DIAGNOSIS — E872 Acidosis: Secondary | ICD-10-CM | POA: Diagnosis present

## 2019-09-05 DIAGNOSIS — Z743 Need for continuous supervision: Secondary | ICD-10-CM | POA: Diagnosis not present

## 2019-09-05 DIAGNOSIS — E1169 Type 2 diabetes mellitus with other specified complication: Secondary | ICD-10-CM

## 2019-09-05 DIAGNOSIS — Z981 Arthrodesis status: Secondary | ICD-10-CM

## 2019-09-05 DIAGNOSIS — N400 Enlarged prostate without lower urinary tract symptoms: Secondary | ICD-10-CM | POA: Diagnosis present

## 2019-09-05 DIAGNOSIS — Z411 Encounter for cosmetic surgery: Secondary | ICD-10-CM | POA: Diagnosis not present

## 2019-09-05 DIAGNOSIS — Z96642 Presence of left artificial hip joint: Secondary | ICD-10-CM | POA: Diagnosis not present

## 2019-09-05 DIAGNOSIS — I10 Essential (primary) hypertension: Secondary | ICD-10-CM | POA: Diagnosis not present

## 2019-09-05 DIAGNOSIS — N183 Chronic kidney disease, stage 3 unspecified: Secondary | ICD-10-CM | POA: Diagnosis present

## 2019-09-05 DIAGNOSIS — M25552 Pain in left hip: Secondary | ICD-10-CM | POA: Diagnosis not present

## 2019-09-05 DIAGNOSIS — E785 Hyperlipidemia, unspecified: Secondary | ICD-10-CM | POA: Diagnosis present

## 2019-09-05 DIAGNOSIS — E669 Obesity, unspecified: Secondary | ICD-10-CM | POA: Diagnosis present

## 2019-09-05 DIAGNOSIS — N1832 Chronic kidney disease, stage 3b: Secondary | ICD-10-CM | POA: Diagnosis not present

## 2019-09-05 DIAGNOSIS — M48061 Spinal stenosis, lumbar region without neurogenic claudication: Secondary | ICD-10-CM | POA: Diagnosis not present

## 2019-09-05 DIAGNOSIS — I509 Heart failure, unspecified: Secondary | ICD-10-CM | POA: Diagnosis not present

## 2019-09-05 DIAGNOSIS — M1612 Unilateral primary osteoarthritis, left hip: Secondary | ICD-10-CM | POA: Diagnosis not present

## 2019-09-05 DIAGNOSIS — M25551 Pain in right hip: Secondary | ICD-10-CM | POA: Diagnosis not present

## 2019-09-05 DIAGNOSIS — E871 Hypo-osmolality and hyponatremia: Secondary | ICD-10-CM | POA: Diagnosis present

## 2019-09-05 DIAGNOSIS — Z820 Family history of epilepsy and other diseases of the nervous system: Secondary | ICD-10-CM

## 2019-09-05 DIAGNOSIS — M40204 Unspecified kyphosis, thoracic region: Secondary | ICD-10-CM | POA: Diagnosis not present

## 2019-09-05 DIAGNOSIS — I129 Hypertensive chronic kidney disease with stage 1 through stage 4 chronic kidney disease, or unspecified chronic kidney disease: Secondary | ICD-10-CM | POA: Diagnosis not present

## 2019-09-05 DIAGNOSIS — N179 Acute kidney failure, unspecified: Secondary | ICD-10-CM | POA: Diagnosis not present

## 2019-09-05 DIAGNOSIS — E1122 Type 2 diabetes mellitus with diabetic chronic kidney disease: Secondary | ICD-10-CM | POA: Diagnosis present

## 2019-09-05 DIAGNOSIS — R279 Unspecified lack of coordination: Secondary | ICD-10-CM | POA: Diagnosis not present

## 2019-09-05 DIAGNOSIS — Z8249 Family history of ischemic heart disease and other diseases of the circulatory system: Secondary | ICD-10-CM | POA: Diagnosis not present

## 2019-09-05 DIAGNOSIS — M5416 Radiculopathy, lumbar region: Secondary | ICD-10-CM | POA: Diagnosis present

## 2019-09-05 DIAGNOSIS — Z7982 Long term (current) use of aspirin: Secondary | ICD-10-CM | POA: Diagnosis not present

## 2019-09-05 DIAGNOSIS — M533 Sacrococcygeal disorders, not elsewhere classified: Secondary | ICD-10-CM | POA: Diagnosis not present

## 2019-09-05 DIAGNOSIS — R2689 Other abnormalities of gait and mobility: Secondary | ICD-10-CM | POA: Diagnosis not present

## 2019-09-05 DIAGNOSIS — Z20822 Contact with and (suspected) exposure to covid-19: Secondary | ICD-10-CM | POA: Diagnosis present

## 2019-09-05 DIAGNOSIS — E1121 Type 2 diabetes mellitus with diabetic nephropathy: Secondary | ICD-10-CM | POA: Diagnosis not present

## 2019-09-05 DIAGNOSIS — M5134 Other intervertebral disc degeneration, thoracic region: Secondary | ICD-10-CM | POA: Diagnosis not present

## 2019-09-05 DIAGNOSIS — K222 Esophageal obstruction: Secondary | ICD-10-CM | POA: Diagnosis not present

## 2019-09-05 DIAGNOSIS — M545 Low back pain: Secondary | ICD-10-CM | POA: Diagnosis not present

## 2019-09-05 DIAGNOSIS — G8929 Other chronic pain: Secondary | ICD-10-CM | POA: Diagnosis present

## 2019-09-05 DIAGNOSIS — Z79899 Other long term (current) drug therapy: Secondary | ICD-10-CM

## 2019-09-05 DIAGNOSIS — M47816 Spondylosis without myelopathy or radiculopathy, lumbar region: Principal | ICD-10-CM | POA: Diagnosis present

## 2019-09-05 DIAGNOSIS — M5124 Other intervertebral disc displacement, thoracic region: Secondary | ICD-10-CM | POA: Diagnosis not present

## 2019-09-05 DIAGNOSIS — Z471 Aftercare following joint replacement surgery: Secondary | ICD-10-CM | POA: Diagnosis not present

## 2019-09-05 DIAGNOSIS — Z8052 Family history of malignant neoplasm of bladder: Secondary | ICD-10-CM | POA: Diagnosis not present

## 2019-09-05 DIAGNOSIS — Z6835 Body mass index (BMI) 35.0-35.9, adult: Secondary | ICD-10-CM

## 2019-09-05 DIAGNOSIS — R531 Weakness: Secondary | ICD-10-CM | POA: Diagnosis not present

## 2019-09-05 DIAGNOSIS — E210 Primary hyperparathyroidism: Secondary | ICD-10-CM

## 2019-09-05 DIAGNOSIS — Z419 Encounter for procedure for purposes other than remedying health state, unspecified: Secondary | ICD-10-CM

## 2019-09-05 LAB — VITAMIN D 25 HYDROXY (VIT D DEFICIENCY, FRACTURES): Vit D, 25-Hydroxy: 89.91 ng/mL (ref 30–100)

## 2019-09-05 LAB — CBC WITH DIFFERENTIAL/PLATELET
Abs Immature Granulocytes: 0.02 10*3/uL (ref 0.00–0.07)
Basophils Absolute: 0.1 10*3/uL (ref 0.0–0.1)
Basophils Relative: 1 %
Eosinophils Absolute: 0.4 10*3/uL (ref 0.0–0.5)
Eosinophils Relative: 6 %
HCT: 39.6 % (ref 39.0–52.0)
Hemoglobin: 12.2 g/dL — ABNORMAL LOW (ref 13.0–17.0)
Immature Granulocytes: 0 %
Lymphocytes Relative: 23 %
Lymphs Abs: 1.6 10*3/uL (ref 0.7–4.0)
MCH: 25.8 pg — ABNORMAL LOW (ref 26.0–34.0)
MCHC: 30.8 g/dL (ref 30.0–36.0)
MCV: 83.7 fL (ref 80.0–100.0)
Monocytes Absolute: 0.8 10*3/uL (ref 0.1–1.0)
Monocytes Relative: 12 %
Neutro Abs: 4.1 10*3/uL (ref 1.7–7.7)
Neutrophils Relative %: 58 %
Platelets: 293 10*3/uL (ref 150–400)
RBC: 4.73 MIL/uL (ref 4.22–5.81)
RDW: 15.5 % (ref 11.5–15.5)
WBC: 7 10*3/uL (ref 4.0–10.5)
nRBC: 0 % (ref 0.0–0.2)

## 2019-09-05 LAB — COMPREHENSIVE METABOLIC PANEL
ALT: 23 U/L (ref 0–44)
AST: 46 U/L — ABNORMAL HIGH (ref 15–41)
Albumin: 3.8 g/dL (ref 3.5–5.0)
Alkaline Phosphatase: 25 U/L — ABNORMAL LOW (ref 38–126)
Anion gap: 11 (ref 5–15)
BUN: 22 mg/dL (ref 8–23)
CO2: 18 mmol/L — ABNORMAL LOW (ref 22–32)
Calcium: 9 mg/dL (ref 8.9–10.3)
Chloride: 103 mmol/L (ref 98–111)
Creatinine, Ser: 1.58 mg/dL — ABNORMAL HIGH (ref 0.61–1.24)
GFR calc Af Amer: 49 mL/min — ABNORMAL LOW (ref >60)
GFR calc non Af Amer: 42 mL/min — ABNORMAL LOW (ref >60)
Glucose, Bld: 123 mg/dL — ABNORMAL HIGH (ref 70–99)
Potassium: 5 mmol/L (ref 3.5–5.1)
Sodium: 132 mmol/L — ABNORMAL LOW (ref 135–145)
Total Bilirubin: 1.1 mg/dL (ref 0.3–1.2)
Total Protein: 6.9 g/dL (ref 6.5–8.1)

## 2019-09-05 LAB — SARS CORONAVIRUS 2 BY RT PCR (HOSPITAL ORDER, PERFORMED IN ~~LOC~~ HOSPITAL LAB): SARS Coronavirus 2: NEGATIVE

## 2019-09-05 MED ORDER — DOCUSATE SODIUM 100 MG PO CAPS
100.0000 mg | ORAL_CAPSULE | Freq: Two times a day (BID) | ORAL | Status: DC
  Administered 2019-09-05 – 2019-09-18 (×26): 100 mg via ORAL
  Filled 2019-09-05 (×26): qty 1

## 2019-09-05 MED ORDER — DEXAMETHASONE SODIUM PHOSPHATE 10 MG/ML IJ SOLN
10.0000 mg | Freq: Once | INTRAMUSCULAR | Status: AC
  Administered 2019-09-05: 10 mg via INTRAVENOUS
  Filled 2019-09-05: qty 1

## 2019-09-05 MED ORDER — METOPROLOL TARTRATE 12.5 MG HALF TABLET
12.5000 mg | ORAL_TABLET | Freq: Two times a day (BID) | ORAL | Status: DC
  Administered 2019-09-05 – 2019-09-18 (×25): 12.5 mg via ORAL
  Filled 2019-09-05 (×26): qty 1

## 2019-09-05 MED ORDER — FINASTERIDE 5 MG PO TABS
5.0000 mg | ORAL_TABLET | Freq: Every day | ORAL | Status: DC
  Administered 2019-09-05 – 2019-09-18 (×14): 5 mg via ORAL
  Filled 2019-09-05 (×15): qty 1

## 2019-09-05 MED ORDER — BISOPROLOL-HYDROCHLOROTHIAZIDE 5-6.25 MG PO TABS
1.0000 | ORAL_TABLET | Freq: Every day | ORAL | Status: DC
  Filled 2019-09-05: qty 1

## 2019-09-05 MED ORDER — TAMSULOSIN HCL 0.4 MG PO CAPS
0.4000 mg | ORAL_CAPSULE | Freq: Every day | ORAL | Status: DC
  Administered 2019-09-05 – 2019-09-18 (×14): 0.4 mg via ORAL
  Filled 2019-09-05 (×14): qty 1

## 2019-09-05 MED ORDER — CARVEDILOL 3.125 MG PO TABS: 3.1250 mg | ORAL_TABLET | Freq: Two times a day (BID) | ORAL | Status: DC

## 2019-09-05 MED ORDER — MAGNESIUM OXIDE 400 (241.3 MG) MG PO TABS
200.0000 mg | ORAL_TABLET | Freq: Two times a day (BID) | ORAL | Status: DC
  Administered 2019-09-05 – 2019-09-18 (×27): 200 mg via ORAL
  Filled 2019-09-05 (×27): qty 1

## 2019-09-05 MED ORDER — HYDROMORPHONE HCL 1 MG/ML IJ SOLN
0.5000 mg | INTRAMUSCULAR | Status: DC | PRN
  Administered 2019-09-08 (×3): 1 mg via INTRAVENOUS
  Filled 2019-09-05 (×3): qty 1

## 2019-09-05 MED ORDER — NITROGLYCERIN 0.3 MG SL SUBL: 0.3000 mg | SUBLINGUAL_TABLET | Freq: Three times a day (TID) | SUBLINGUAL | Status: DC

## 2019-09-05 MED ORDER — ONDANSETRON HCL 4 MG PO TABS: 4.0000 mg | ORAL_TABLET | Freq: Four times a day (QID) | ORAL | Status: DC | PRN

## 2019-09-05 MED ORDER — ACETAMINOPHEN 325 MG PO TABS: 650.0000 mg | ORAL_TABLET | Freq: Four times a day (QID) | ORAL | Status: DC | PRN

## 2019-09-05 MED ORDER — FENOFIBRATE 160 MG PO TABS
160.0000 mg | ORAL_TABLET | Freq: Every day | ORAL | Status: DC
  Administered 2019-09-05 – 2019-09-18 (×14): 160 mg via ORAL
  Filled 2019-09-05 (×16): qty 1

## 2019-09-05 MED ORDER — POLYETHYLENE GLYCOL 3350 17 G PO PACK: 17.0000 g | PACK | Freq: Every day | ORAL | Status: DC | PRN

## 2019-09-05 MED ORDER — METHOCARBAMOL 1000 MG/10ML IJ SOLN
500.0000 mg | Freq: Four times a day (QID) | INTRAVENOUS | Status: DC | PRN
  Filled 2019-09-05: qty 5

## 2019-09-05 MED ORDER — LOSARTAN POTASSIUM 50 MG PO TABS
100.0000 mg | ORAL_TABLET | Freq: Every day | ORAL | Status: DC
  Administered 2019-09-05 – 2019-09-18 (×14): 100 mg via ORAL
  Filled 2019-09-05 (×15): qty 2

## 2019-09-05 MED ORDER — ONDANSETRON HCL 4 MG/2ML IJ SOLN
4.0000 mg | Freq: Four times a day (QID) | INTRAMUSCULAR | Status: DC | PRN
  Administered 2019-09-07: 4 mg via INTRAVENOUS
  Filled 2019-09-05: qty 2

## 2019-09-05 MED ORDER — PANTOPRAZOLE SODIUM 40 MG PO TBEC
40.0000 mg | DELAYED_RELEASE_TABLET | Freq: Every day | ORAL | Status: DC
  Administered 2019-09-05 – 2019-09-07 (×3): 40 mg via ORAL
  Filled 2019-09-05 (×4): qty 1

## 2019-09-05 MED ORDER — ACETAMINOPHEN 650 MG RE SUPP: 650.0000 mg | Freq: Four times a day (QID) | RECTAL | Status: DC | PRN

## 2019-09-05 MED ORDER — OXYCODONE HCL 5 MG PO TABS
5.0000 mg | ORAL_TABLET | ORAL | Status: DC | PRN
  Filled 2019-09-05: qty 1

## 2019-09-05 MED ORDER — HYOSCYAMINE SULFATE 0.125 MG SL SUBL
0.1250 mg | SUBLINGUAL_TABLET | Freq: Every day | SUBLINGUAL | Status: DC | PRN
  Filled 2019-09-05: qty 1

## 2019-09-05 MED ORDER — BISOPROLOL FUMARATE 5 MG PO TABS
5.0000 mg | ORAL_TABLET | Freq: Every day | ORAL | Status: DC
  Filled 2019-09-05: qty 1

## 2019-09-05 MED ORDER — AMLODIPINE BESYLATE 5 MG PO TABS
5.0000 mg | ORAL_TABLET | Freq: Every day | ORAL | Status: DC
  Administered 2019-09-05 – 2019-09-18 (×14): 5 mg via ORAL
  Filled 2019-09-05 (×14): qty 1

## 2019-09-05 NOTE — ED Provider Notes (Signed)
  Physical Exam  BP (!) 168/95   Pulse (!) 56   Temp 98.4 F (36.9 C) (Oral)   Resp 17   Ht 5\' 11"  (1.803 m)   Wt 115 kg   SpO2 99%   BMI 35.36 kg/m   Physical Exam  ED Course/Procedures     Procedures  MDM  Transfer from [facility] for back pain and weakness.  Cannot ambulate.  MRI does show severe spinal stenosis.  Has seen Dr. Ellene Route in the past.  Discussed with [facility] from neurosurgery.  Either Dr. Ellene Route or she will come by to see the patient       Davonna Belling, MD 09/05/19 [phone]

## 2019-09-05 NOTE — ED Provider Notes (Signed)
[facility] DEPT Provider Note   CSN: [id number] Arrival date & time: 09/05/19  0227     History Chief Complaint  Patient presents with  . Hip Pain    [name] [name] is a 79 y.o. male.  Patient to ED by EMS for pain in bilateral LE's, specifically to bilateral lateral mid thighs, and difficulty walking. Symptoms started 3 days ago but have become severe to the point where he could not get himself from sitting to standing and had to crawl to the bathroom. No bowel or bladder dysfunction. He states "my legs won't do what I tell them to do". He denies numbness. He has chronic left hip pain from complications during previous hip replacement surgeries but does not feel this is changed.   The history is provided by the patient. No language interpreter was used.  Hip Pain Pertinent negatives include no abdominal pain.       Past Medical History:  Diagnosis Date  . Arthritis    in left hip  . ED (erectile dysfunction)   . Enlarged prostate with lower urinary tract symptoms (LUTS)   . GERD (gastroesophageal reflux disease)   . History of chronic gastritis   . History of colon polyps    hyperplastic 2006  . History of esophageal stricture    S/P  DILATATION 2009; 2010; 2010 2012  . History of kidney stones    hx. multiple kidney stones  . History of kidney stones   . History of primary hyperparathyroidism    s/p  left superior parathyroidectomy 07-10-2014  . Hyperlipidemia   . Hypertension   . Ileus ([place]) 05/02/2017  . Left ureteral stone   . Pre-diabetes   . Sepsis secondary to UTI ([place]) 05/02/2017    Patient Active Problem List   Diagnosis Date Noted  . Abnormal glucose 08/11/2019  . Herniated nucleus pulposus, L2-3 left 11/09/2017  . Normocytic anemia 05/02/2017  . Vitamin B12 deficiency 05/02/2017  . OA (osteoarthritis) of hip 06/24/2016  . CKD (chronic kidney disease) stage 3, GFR 30-59 ml/min (HCC) 03/25/2015  . Snoring 03/25/2015  .  Encounter for Medicare annual wellness exam 11/27/2014  . Hyperparathyroidism, primary ([place]) 07/09/2014  . Vitamin D deficiency 06/13/2013  . Medication management 06/13/2013  . Hyperlipidemia   . Hypertension   . GERD (gastroesophageal reflux disease)   . Kidney stones   . Morbidly obese ([place])   . Stricture and stenosis of esophagus 10/18/2008    Past Surgical History:  Procedure Laterality Date  . BACK SURGERY  04/2018   fusion L2 and L3  . COLONOSCOPY  02/17/2005  . CYSTO/  LEFT RETROGRADE PYELOGRAM/ STENT PLACEMENT  01/26/2005  . CYSTOSCOPY W/ URETEROSCOPY W/ LITHOTRIPSY Left 05/04/2005  . CYSTOSCOPY WITH STENT PLACEMENT Left 12/30/2015   Procedure: CYSTOSCOPY WITH STENT PLACEMENT;  Surgeon: Franchot Gallo, MD;  Location: [facility];  Service: Urology;  Laterality: Left;  . CYSTOSCOPY/RETROGRADE/URETEROSCOPY/STONE EXTRACTION WITH BASKET Left 12/30/2015   Procedure: CYSTOSCOPY/RETROGRADE/URETEROSCOPY/STONE EXTRACTION WITH BASKET;  Surgeon: Franchot Gallo, MD;  Location: [facility];  Service: Urology;  Laterality: Left;  . CYSTOSCOPY/URETEROSCOPY/HOLMIUM LASER/STENT PLACEMENT Left 03/09/2016   Procedure: CYSTOSCOPY/RETROGRADE PYELOGRAM/URETEROSCOPY/BASKET STONE EXTRACTION/STENT PLACEMENT;  Surgeon: Franchot Gallo, MD;  Location: [facility];  Service: Urology;  Laterality: Left;  . ESOPHAGOGASTRODUODENOSCOPY (EGD) WITH ESOPHAGEAL DILATION  x4  last one 07-21-2010  . HOLMIUM LASER APPLICATION Left 93/71/6967   Procedure: HOLMIUM LASER APPLICATION;  Surgeon: Franchot Gallo, MD;  Location: [facility];  Service: Urology;  Laterality: Left;  . INGUINAL HERNIA REPAIR Left yrs ago  . LUMBAR LAMINECTOMY/DECOMPRESSION MICRODISCECTOMY Left 11/09/2017   Procedure: Left Lumbar Two-Three Microdiscectomy;  Surgeon: Kristeen Miss, MD;  Location: [facility];  Service: Neurosurgery;  Laterality: Left;  Left Lumbar Two-Three Microdiscectomy  .  NEPHROLITHOTOMY Left 02/01/2014   Procedure: NEPHROLITHOTOMY PERCUTANEOUS;  Surgeon: Jorja Loa, MD;  Location: [facility];  Service: Urology;  Laterality: Left;  . ORIF HIP FRACTURE Left 04/16/2017   Procedure: OPEN REDUCTION INTERNAL FIXATION HIP GREATER TROCHANTER;  Surgeon: Frederik Pear, MD;  Location: [facility];  Service: Orthopedics;  Laterality: Left;  . PARATHYROIDECTOMY Left 07/10/2014   Procedure: LEFT SUPERIOR PARATHYROIDECTOMY;  Surgeon: Armandina Gemma, MD;  Location: [facility];  Service: General;  Laterality: Left;  . TOTAL HIP ARTHROPLASTY Left 06/24/2016   Procedure: LEFT TOTAL HIP ARTHROPLASTY ANTERIOR APPROACH;  Surgeon: Gaynelle Arabian, MD;  Location: [facility];  Service: Orthopedics;  Laterality: Left;  . TOTAL HIP REVISION Left 11/18/2016   Procedure: Left femoral revision - posterior approach;  Surgeon: Gaynelle Arabian, MD;  Location: [facility];  Service: Orthopedics;  Laterality: Left;  . tumor ear Left age 26    topical growth behind left ear.       Family History  Problem Relation Age of Onset  . Heart disease Mother   . Alzheimer's disease Mother   . Heart disease Father   . Bladder Cancer Sister   . Colon cancer Neg Hx   . Esophageal cancer Neg Hx   . Stomach cancer Neg Hx   . Rectal cancer Neg Hx     Social History   Tobacco Use  . Smoking status: Former Smoker    Years: 8.00    Quit date: 03/02/1974    Years since quitting: 45.5  . Smokeless tobacco: Never Used  Vaping Use  . Vaping Use: Never used  Substance Use Topics  . Alcohol use: Yes    Alcohol/week: 1.0 - 2.0 standard drink    Types: 1 - 2 Glasses of wine per week    Comment: q afternoon- 11/08/2017- none in 10 days  . Drug use: No    Home Medications Prior to Admission medications   Medication Sig Start Date End Date Taking? Authorizing Provider  aspirin EC 81 MG tablet Take 81 mg by mouth daily.    [provider]  b complex vitamins tablet Take 1 tablet by mouth daily.    [provider]  bisoprolol-hydrochlorothiazide [facility]) 5-6.25 MG tablet Take 1 tablet Daily for BP 11/03/18   Unk Pinto, MD  Cholecalciferol (VITAMIN D PO) Take 5,000 Units by mouth daily.     [provider]  dexlansoprazole (DEXILANT) 60 MG capsule Take 1 capsule (60 mg total) by mouth daily. 05/25/19   Mauri Pole, MD  erythromycin ophthalmic ointment Apply 1 cm ribbon in affected eye(s) up to 2 times daily for 7 days 12/14/18   Vicie Mutters, PA-C  fenofibrate (TRICOR) 145 MG tablet Take 1 tablet Daily for Triglycerides (Blood Fats) 10/17/18   Unk Pinto, MD  finasteride (PROSCAR) 5 MG tablet Take 5 mg by mouth daily.    [provider]  Flaxseed, Linseed, (FLAX SEEDS PO) Take 1,000 mg by mouth daily.     [provider]  hyoscyamine (LEVSIN SL) 0.125 MG SL tablet Place 1 tablet (0.125 mg total) under the tongue daily as needed. 05/25/19   Mauri Pole, MD  losartan (COZAAR) 100 MG tablet TAKE 1  TABLET BY MOUTH DAILY FOR BLOOD PRESSURE 05/03/19   Garnet Sierras, NP  Magnesium Oxide 250 MG TABS Take 250 mg by mouth 2 (two) times daily.     [provider]  meloxicam (MOBIC) 15 MG tablet TAKE 1/2 TO 1 TABLET DAILY WITH FOOD FOR PAIN AND INFLAMMATION 08/14/19   Unk Pinto, MD  nitroGLYCERIN (NITROSTAT) 0.3 MG SL tablet Dissolve 1 tablet 3 x /day under tongue Before Meals for Esophageal Spasm 02/06/19   Unk Pinto, MD  Omega-3 Fatty Acids (FISH OIL) 1200 MG CAPS Take 1,200 mg by mouth 2 (two) times daily.     [provider]  pseudoephedrine-acetaminophen (TYLENOL SINUS) 30-500 MG TABS tablet Take 1 tablet by mouth every 4 (four) hours as needed.    [provider]  tamsulosin (FLOMAX) 0.4 MG CAPS capsule Take 0.4 mg by mouth daily after breakfast.  11/06/14   [provider]  vitamin B-12 (CYANOCOBALAMIN) 1000 MCG tablet Take 1 tablet (1,000 mcg total) by mouth daily. 05/05/17   Hongalgi, Lenis Dickinson, MD    Allergies      Iodinated diagnostic agents and Iohexol  Review of Systems   Review of Systems  Constitutional: Negative for chills and fever.  HENT: Negative.   Respiratory: Negative.   Cardiovascular: Negative.   Gastrointestinal: Negative.  Negative for abdominal pain.  Genitourinary: Negative for enuresis.  Musculoskeletal:       See HPI.  Skin: Negative.  Negative for color change and wound.  Neurological: Positive for weakness. Negative for numbness.       See HPI.    Physical Exam Updated Vital Signs BP (!) 149/80 (BP Location: Left Arm)   Pulse (!) 56   Temp 98.4 F (36.9 C) (Oral)   Resp 18   Ht 5\' 11"  (1.803 m)   Wt 115 kg   SpO2 99%   BMI 35.36 kg/m   Physical Exam Vitals and nursing note reviewed.  Constitutional:      Appearance: He is well-developed.  HENT:     Head: Normocephalic.  Cardiovascular:     Rate and Rhythm: Normal rate.  Pulmonary:     Effort: Pulmonary effort is normal.  Abdominal:     General: Bowel sounds are normal.     Palpations: Abdomen is soft.     Tenderness: There is no abdominal tenderness. There is no guarding or rebound.  Musculoskeletal:        General: Tenderness (Chronic tenderness over lateral left hip, unchanged.) present. Normal range of motion.     Cervical back: Normal range of motion and neck supple.     Right lower leg: No edema.     Left lower leg: No edema.  Skin:    General: Skin is warm and dry.  Neurological:     Mental Status: He is alert and oriented to person, place, and time.     Comments: Straight leg raise negative bilateral LE's. No strength deficits. Reflexes hypo bilaterally, equal. Discoordinated movement of LE's when attempting ambulation. He appears to have difficulty lifting his legs to step forward and a loss of proprioception. No sensory deficits.     ED Results / Procedures / Treatments   Labs (all labs ordered are listed, but only abnormal results are displayed) Labs Reviewed  SARS CORONAVIRUS 2 BY  RT PCR (HOSPITAL ORDER, [place] [facility])  CBC WITH DIFFERENTIAL/PLATELET  COMPREHENSIVE METABOLIC PANEL    EKG None  Radiology DG Hip Unilat With Pelvis 2-3 Views  Left  Result Date: 09/05/2019 CLINICAL DATA:  Hip pain since replacement in 1998, now acutely worsening with difficulty bearing weight EXAM: DG HIP (WITH OR WITHOUT PELVIS) 2-3V LEFT COMPARISON:  Radiograph 05/02/2017, CT 03/23/2017 FINDINGS: Postsurgical changes from prior total left hip arthroplasty secured by a collared femoral stem as well as a side plate and cerclage wire fixation construct. There is extensive periprosthetic lucency about the proximal femoral stem and neck as well as increasing lucency through the inferior aspect of the greater trochanter. Could reflect progressive hardware loosening. Mild lucency along the inferior aspect of the femoral plate and screw construct is also new since 2019. No acute periprosthetic fracture is evident. Remaining bones of the pelvis appear intact and congruent. Advanced arthrosis at the right hip is noted with large periacetabular osteophytes and stable ossifications. Further degenerative changes in the lower lumbar spine, SI joints and symphysis pubis. Soft tissues are unremarkable. IMPRESSION: 1. Extensive periprosthetic lucency about the proximal left femoral stem and neck as well as increasing lucency through the inferior aspect of the greater trochanter and at the distal extent of the side plate construct. Could reflect progressive hardware loosening. Infection less favored in the absence of other septic features. 2. No acute periprosthetic fracture or other acute osseous abnormality. Electronically Signed   By: Lovena Le M.D.   On: 09/05/2019 03:26    Procedures Procedures (including critical care time)  Medications Ordered in ED Medications - No data to display  ED Course  I have reviewed the triage vital signs and the nursing notes.  Pertinent labs &  imaging results that were available during my care of the patient were reviewed by me and considered in my medical decision making (see chart for details).    MDM Rules/Calculators/A&P                          Patient to ED with ss/sxs as detailed in HPI.   He has an abnormal neurologic exam as described. Chart reviewed. He has had previous lumbar surgeries ([place]): 04/29/18 for recurrent herniated disc L2-3 with radiculopathy, neurogenic claudication, right foot drop; 11/10/17 - herniated L2-3 disc.   He has been seen and evaluated by Dr. Leonides Schanz who agrees with abnormal exam.   He will need spinal MRI and neurosurgery consultation, which would be provided at [facility]. Will transfer. Discussed with Dr. Dina Rich who accepts the patient for ED to ED transfer for further necessary evaluation.   Final Clinical Impression(s) / ED Diagnoses Final diagnoses:  None   1. Abnormal gait 2. Neuro deficit, bilateral LE's  Rx / DC Orders ED Discharge Orders    None       Charlann Lange, PA-C 09/05/19 [place], Delice Bison, DO 09/05/19 [phone]

## 2019-09-05 NOTE — ED Notes (Signed)
[facility] arrived for transport.

## 2019-09-05 NOTE — Progress Notes (Signed)
Patient ID: [name] [name], male   DOB: October 30, 1940, 78 y.o.   MRN: [id number] I have reviewed the MRI and x-rays available on [name] today. I have seen and examined him. I have reviewed the note left by Arnetha Massy and agree with her assessment. [name] is being admitted for treatment of his spinal stenosis at L3-4 and L4-5 which is giving him significant symptoms of pain and weakness in the lower extremities. In addition I have reviewed the plain x-ray that suggests there is some loosening around the hardware in the left hip. I have placed a call to Dr. Frederik Pear to ask him to review these x-rays. In the meantime will obtain a sed rate and CRP to see if there is any sign that suggests infection. Patient is not febrile. I did give him 1 dose of Decadron 10 mg. We will see how he does in the next day or so but the surgery is planned for this coming Thursday. I have asked the hospitalist to help evaluate the electrolyte and renal abnormalities.

## 2019-09-05 NOTE — H&P (Signed)
[name] [name] is an 79 y.o. male.   Chief Complaint: Weakness of lower extremities HPI: Patient is a 79 year old obese male with a history of an L2-3 posterior lumbar fusion by Dr. Ellene Route in February of 2020. He also has a history of an ORIF of his left hip by Dr. Hal Morales and a left total hip arthroplasty and subsequent revision by Dr. Wynelle Link. He presented to the [facility] ED early this morning with a complaint of low back pain and lower extremity weakness. His exam was concerning for cord impingement and he was transferred to [facility] for MRI of his thoracic and lumbar spine. He reports an increase in his lowe back pain over the last three months. While vacationing with his family in [place], he noticed he was having progressively worsening difficulty with climbing stair. Overnight, his symptoms worsened to the point that he was unable to ambulate. He feels he doesn't have full control of his lower extremities. He denies bowel or bladder dysfunction. He denies numbness or tingling. Lumbar MRI revealed L3-4 interval adjacent segment degeneration with advanced spinal stenosis. There was also moderate spinal stenosis at L4-5 primarily from posterior element hypertrophy. Neurosurgery was contacted for further evaluation and treatment.  Past Medical History:  Diagnosis Date  . Arthritis    in left hip  . ED (erectile dysfunction)   . Enlarged prostate with lower urinary tract symptoms (LUTS)   . GERD (gastroesophageal reflux disease)   . History of chronic gastritis   . History of colon polyps    hyperplastic 2006  . History of esophageal stricture    S/P  DILATATION 2009; 2010; 2010 2012  . History of kidney stones    hx. multiple kidney stones  . History of kidney stones   . History of primary hyperparathyroidism    s/p  left superior parathyroidectomy 07-10-2014  . Hyperlipidemia   . Hypertension   . Ileus ([place]) 05/02/2017  . Left ureteral stone   . Pre-diabetes   . Sepsis secondary to  UTI ([place]) 05/02/2017    Past Surgical History:  Procedure Laterality Date  . BACK SURGERY  04/2018   fusion L2 and L3  . COLONOSCOPY  02/17/2005  . CYSTO/  LEFT RETROGRADE PYELOGRAM/ STENT PLACEMENT  01/26/2005  . CYSTOSCOPY W/ URETEROSCOPY W/ LITHOTRIPSY Left 05/04/2005  . CYSTOSCOPY WITH STENT PLACEMENT Left 12/30/2015   Procedure: CYSTOSCOPY WITH STENT PLACEMENT;  Surgeon: Franchot Gallo, MD;  Location: [facility];  Service: Urology;  Laterality: Left;  . CYSTOSCOPY/RETROGRADE/URETEROSCOPY/STONE EXTRACTION WITH BASKET Left 12/30/2015   Procedure: CYSTOSCOPY/RETROGRADE/URETEROSCOPY/STONE EXTRACTION WITH BASKET;  Surgeon: Franchot Gallo, MD;  Location: [facility];  Service: Urology;  Laterality: Left;  . CYSTOSCOPY/URETEROSCOPY/HOLMIUM LASER/STENT PLACEMENT Left 03/09/2016   Procedure: CYSTOSCOPY/RETROGRADE PYELOGRAM/URETEROSCOPY/BASKET STONE EXTRACTION/STENT PLACEMENT;  Surgeon: Franchot Gallo, MD;  Location: [facility];  Service: Urology;  Laterality: Left;  . ESOPHAGOGASTRODUODENOSCOPY (EGD) WITH ESOPHAGEAL DILATION  x4  last one 07-21-2010  . HOLMIUM LASER APPLICATION Left 88/50/2774   Procedure: HOLMIUM LASER APPLICATION;  Surgeon: Franchot Gallo, MD;  Location: [facility];  Service: Urology;  Laterality: Left;  . INGUINAL HERNIA REPAIR Left yrs ago  . LUMBAR LAMINECTOMY/DECOMPRESSION MICRODISCECTOMY Left 11/09/2017   Procedure: Left Lumbar Two-Three Microdiscectomy;  Surgeon: Kristeen Miss, MD;  Location: [place];  Service: Neurosurgery;  Laterality: Left;  Left Lumbar Two-Three Microdiscectomy  . NEPHROLITHOTOMY Left 02/01/2014   Procedure: NEPHROLITHOTOMY PERCUTANEOUS;  Surgeon: Jorja Loa, MD;  Location: [facility];  Service:  Urology;  Laterality: Left;  . ORIF HIP FRACTURE Left 04/16/2017   Procedure: OPEN REDUCTION INTERNAL FIXATION HIP GREATER TROCHANTER;  Surgeon: Frederik Pear, MD;  Location: [place];  Service: Orthopedics;   Laterality: Left;  . PARATHYROIDECTOMY Left 07/10/2014   Procedure: LEFT SUPERIOR PARATHYROIDECTOMY;  Surgeon: Armandina Gemma, MD;  Location: [place];  Service: General;  Laterality: Left;  . TOTAL HIP ARTHROPLASTY Left 06/24/2016   Procedure: LEFT TOTAL HIP ARTHROPLASTY ANTERIOR APPROACH;  Surgeon: Gaynelle Arabian, MD;  Location: [facility];  Service: Orthopedics;  Laterality: Left;  . TOTAL HIP REVISION Left 11/18/2016   Procedure: Left femoral revision - posterior approach;  Surgeon: Gaynelle Arabian, MD;  Location: [facility];  Service: Orthopedics;  Laterality: Left;  . tumor ear Left age 18    topical growth behind left ear.    Family History  Problem Relation Age of Onset  . Heart disease Mother   . Alzheimer's disease Mother   . Heart disease Father   . Bladder Cancer Sister   . Colon cancer Neg Hx   . Esophageal cancer Neg Hx   . Stomach cancer Neg Hx   . Rectal cancer Neg Hx    Social History:  reports that he quit smoking about 45 years ago. He quit after 8.00 years of use. He has never used smokeless tobacco. He reports current alcohol use of about 1.0 - 2.0 standard drink of alcohol per week. He reports that he does not use drugs.  Allergies:  Allergies  Allergen Reactions  . Iodinated Diagnostic Agents Shortness Of Breath  . Iohexol Shortness Of Breath    (Not in a hospital admission)   Results for orders placed or performed during the hospital encounter of 09/05/19 (from the past 48 hour(s))  SARS Coronavirus 2 by RT PCR (hospital order, performed in [facility] hospital lab) Nasopharyngeal Nasopharyngeal Swab     Status: None   Collection Time: 09/05/19  4:01 AM   Specimen: Nasopharyngeal Swab  Result Value Ref Range   SARS Coronavirus 2 NEGATIVE NEGATIVE    Comment: (NOTE) SARS-CoV-2 target nucleic acids are NOT DETECTED.  The SARS-CoV-2 RNA is generally detectable in upper and lower respiratory specimens during the acute phase of infection. The lowest concentration of  SARS-CoV-2 viral copies this assay can detect is 250 copies / mL. A negative result does not preclude SARS-CoV-2 infection and should not be used as the sole basis for treatment or other patient management decisions.  A negative result may occur with improper specimen collection / handling, submission of specimen other than nasopharyngeal swab, presence of viral mutation(s) within the areas targeted by this assay, and inadequate number of viral copies (<250 copies / mL). A negative result must be combined with clinical observations, patient history, and epidemiological information.  Fact Sheet for Patients:   StrictlyIdeas.no  Fact Sheet for Healthcare Providers: BankingDealers.co.za  This test is not yet approved or  cleared by the [place] FDA and has been authorized for detection and/or diagnosis of SARS-CoV-2 by FDA under an Emergency Use Authorization (EUA).  This EUA will remain in effect (meaning this test can be used) for the duration of the COVID-19 declaration under Section 564(b)(1) of the Act, 21 U.S.C. section 360bbb-3(b)(1), unless the authorization is terminated or revoked sooner.  Performed at [facility], [place] [address] [place], [place] [address]   CBC with Differential     Status: Abnormal   Collection Time: 09/05/19  4:02 AM  Result Value Ref  Range   WBC 7.0 4.0 - 10.5 K/uL   RBC 4.73 4.22 - 5.81 MIL/uL   Hemoglobin 12.2 (L) 13.0 - 17.0 g/dL   HCT 39.6 39 - 52 %   MCV 83.7 80.0 - 100.0 fL   MCH 25.8 (L) 26.0 - 34.0 pg   MCHC 30.8 30.0 - 36.0 g/dL   RDW 15.5 11.5 - 15.5 %   Platelets 293 150 - 400 K/uL   nRBC 0.0 0.0 - 0.2 %   Neutrophils Relative % 58 %   Neutro Abs 4.1 1.7 - 7.7 K/uL   Lymphocytes Relative 23 %   Lymphs Abs 1.6 0.7 - 4.0 K/uL   Monocytes Relative 12 %   Monocytes Absolute 0.8 0 - 1 K/uL   Eosinophils Relative 6 %   Eosinophils Absolute 0.4 0 - 0 K/uL   Basophils  Relative 1 %   Basophils Absolute 0.1 0 - 0 K/uL   Immature Granulocytes 0 %   Abs Immature Granulocytes 0.02 0.00 - 0.07 K/uL    Comment: Performed at [facility], [place] [address], [place], [place] [address]  Comprehensive metabolic panel     Status: Abnormal   Collection Time: 09/05/19  4:02 AM  Result Value Ref Range   Sodium 132 (L) 135 - 145 mmol/L   Potassium 5.0 3.5 - 5.1 mmol/L   Chloride 103 98 - 111 mmol/L   CO2 18 (L) 22 - 32 mmol/L   Glucose, Bld 123 (H) 70 - 99 mg/dL    Comment: Glucose reference range applies only to samples taken after fasting for at least 8 hours.   BUN 22 8 - 23 mg/dL   Creatinine, Ser 1.58 (H) 0.61 - 1.24 mg/dL   Calcium 9.0 8.9 - 10.3 mg/dL   Total Protein 6.9 6.5 - 8.1 g/dL   Albumin 3.8 3.5 - 5.0 g/dL   AST 46 (H) 15 - 41 U/L   ALT 23 0 - 44 U/L   Alkaline Phosphatase 25 (L) 38 - 126 U/L   Total Bilirubin 1.1 0.3 - 1.2 mg/dL   GFR calc non Af Amer 42 (L) >60 mL/min   GFR calc Af Amer 49 (L) >60 mL/min   Anion gap 11 5 - 15    Comment: Performed at [facility], [place] [address] [place], [place] [address]   MR THORACIC SPINE WO CONTRAST  Result Date: 09/05/2019 CLINICAL DATA:  Chronic pain with difficulty ambulating. EXAM: MRI THORACIC SPINE WITHOUT CONTRAST TECHNIQUE: Multiplanar, multisequence MR imaging of the thoracic spine was performed. No intravenous contrast was administered. COMPARISON:  None. FINDINGS: Alignment:  Exaggerated thoracic kyphosis. Vertebrae: No fracture, evidence of discitis, or bone lesion. Cord:  Normal signal and morphology. Paraspinal and other soft tissues: Partially covered right renal cystic intensity Disc levels: Diffuse disc narrowing and mild to moderate spondylitic spurring. There are central disc protrusions at T5-6, T6-7, T7-8, and T8-9 which contact and deform the ventral cord on axial slices. No complete effacement of CSF. Overall mild facet spurring. IMPRESSION: 1. No acute  finding. 2. Disc degeneration mainly in the midthoracic spine where herniations contact cord from T5-6 to T8-9 3. No high-grade canal or foraminal stenosis. Electronically Signed   By: Monte Fantasia M.D.   On: 09/05/2019 08:10   MR LUMBAR SPINE WO CONTRAST  Result Date: 09/05/2019 CLINICAL DATA:  Progressive low back pain with neurologic deficit EXAM: MRI LUMBAR SPINE WITHOUT CONTRAST TECHNIQUE: Multiplanar, multisequence MR imaging of the lumbar spine  was performed. No intravenous contrast was administered. COMPARISON:  Lumbar myelogram 03/24/2018 FINDINGS: Segmentation:  Standard lumbar numbering Alignment:  Trace L5-S1 anterolisthesis. Vertebrae: No fracture, evidence of discitis, or bone lesion. Solid arthrodesis at L2-3. Conus medullaris and cauda equina: Conus extends to the T12-L1 level. Conus and cauda equina appear normal. Paraspinal and other soft tissues: No acute finding. Renal cysts and colonic diverticulosis. Disc levels: T12- L1: Spondylosis.  No impingement L1-L2: Spondylosis and posterior element hypertrophy. No neural impingement L2-L3: Interval PLIF with solid arthrodesis. No residual impingement L3-L4: Prominent facet degeneration with spurring and marked ligamentum flavum thickening. The disc is bulging with a broad central protrusion based on sagittal images. Advanced spinal stenosis with complete effacement of CSF. The foramina are patent L4-L5: Advanced facet osteoarthritis with bulky bony and ligamentous hypertrophy. Moderate spinal stenosis, especially at the subarticular recesses L5-S1:Degenerative facet spurring which is bulky. Negative disc space. No impingement IMPRESSION: 1. L3-4 interval adjacent segment degeneration with advanced spinal stenosis. 2. L4-5 moderate spinal stenosis primarily from posterior element hypertrophy. 3. L2-3 interval PLIF with solid arthrodesis and no residual impingement. Electronically Signed   By: Monte Fantasia M.D.   On: 09/05/2019 07:52   DG Hip  Unilat With Pelvis 2-3 Views Left  Result Date: 09/05/2019 CLINICAL DATA:  Hip pain since replacement in 1998, now acutely worsening with difficulty bearing weight EXAM: DG HIP (WITH OR WITHOUT PELVIS) 2-3V LEFT COMPARISON:  Radiograph 05/02/2017, CT 03/23/2017 FINDINGS: Postsurgical changes from prior total left hip arthroplasty secured by a collared femoral stem as well as a side plate and cerclage wire fixation construct. There is extensive periprosthetic lucency about the proximal femoral stem and neck as well as increasing lucency through the inferior aspect of the greater trochanter. Could reflect progressive hardware loosening. Mild lucency along the inferior aspect of the femoral plate and screw construct is also new since 2019. No acute periprosthetic fracture is evident. Remaining bones of the pelvis appear intact and congruent. Advanced arthrosis at the right hip is noted with large periacetabular osteophytes and stable ossifications. Further degenerative changes in the lower lumbar spine, SI joints and symphysis pubis. Soft tissues are unremarkable. IMPRESSION: 1. Extensive periprosthetic lucency about the proximal left femoral stem and neck as well as increasing lucency through the inferior aspect of the greater trochanter and at the distal extent of the side plate construct. Could reflect progressive hardware loosening. Infection less favored in the absence of other septic features. 2. No acute periprosthetic fracture or other acute osseous abnormality. Electronically Signed   By: Lovena Le M.D.   On: 09/05/2019 03:26    Review of Systems  Constitutional: Positive for activity change. Negative for appetite change, chills, diaphoresis, fatigue and fever.  HENT: Negative.   Eyes: Negative.   Respiratory: Negative.   Cardiovascular: Negative.   Gastrointestinal: Negative for constipation, diarrhea, nausea and vomiting.  Endocrine: Negative.   Genitourinary: Negative for decreased urine  volume, difficulty urinating, dysuria, flank pain and urgency.  Musculoskeletal: Positive for arthralgias, back pain, gait problem and myalgias.  Skin: Negative.   Allergic/Immunologic: Negative.   Neurological: Positive for weakness. Negative for dizziness, light-headedness and numbness.  Hematological: Negative.   Psychiatric/Behavioral: Negative.     Blood pressure (!) 168/95, pulse (!) 56, temperature 98.4 F (36.9 C), temperature source Oral, resp. rate 17, height 5\' 11"  (1.803 m), weight 115 kg, SpO2 99 %. Physical Exam Constitutional:      Appearance: He is obese.  HENT:     Head: Normocephalic  and atraumatic.     Nose: Nose normal. No congestion.     Mouth/Throat:     Mouth: Mucous membranes are moist.     Pharynx: Oropharynx is clear.  Eyes:     Extraocular Movements: Extraocular movements intact.     Conjunctiva/sclera: Conjunctivae normal.     Pupils: Pupils are equal, round, and reactive to light.  Cardiovascular:     Rate and Rhythm: Normal rate.     Pulses: Normal pulses.  Pulmonary:     Effort: Pulmonary effort is normal. No respiratory distress.  Abdominal:     Palpations: Abdomen is soft.     Tenderness: There is no abdominal tenderness. There is no right CVA tenderness or left CVA tenderness.  Musculoskeletal:     Cervical back: Normal range of motion and neck supple.     Lumbar back: Positive right straight leg raise test and positive left straight leg raise test.     Left hip: Decreased range of motion. Decreased strength.     Right lower leg: No edema.     Left lower leg: No edema.  Neurological:     Mental Status: He is alert.     GCS: GCS eye subscore is 4. GCS verbal subscore is 5. GCS motor subscore is 6.     Cranial Nerves: Cranial nerves are intact.     Sensory: Sensation is intact.     Motor: Weakness present.     Coordination: Coordination is intact.     Gait: Gait abnormal.     Comments:  Left hip flexor strength 4/5 Left knee extension  4/5 Left dorsiflexion 3/5 Left plantar flexion 3/5 (patient reports he is weaker in his left lower extremity at baseline)  Psychiatric:        Attention and Perception: Attention normal.        Mood and Affect: Mood and affect normal.        Speech: Speech normal.      Assessment/Plan Patient with significant lumbar stenosis at L3-4. Decadron 10 mg IV ordered Neurosurgery will admit for further evaluation and observation.  Patricia Nettle, NP 09/05/2019, 10:46 AM

## 2019-09-05 NOTE — ED Triage Notes (Signed)
Pt sts a history on numerous bad left hip operations, chronic pain and difficulty ambulating. He felt as if legs were going to give out, lowered himself to the floor and scooted to restroom. Pt wants left hip xr to verify placement.

## 2019-09-05 NOTE — ED Notes (Signed)
Report called to [facility] Charge RN for transfer. [facility] called for transport

## 2019-09-05 NOTE — Consult Note (Signed)
[facility] Medical Consultation  [name] [name] KGY:[id number] DOB: [birth year] DOA: 09/05/2019 PCP: Unk Pinto, MD   Requesting physician: Dr. Ellene Route Date of consultation: 09/05/2019 Reason for consultation: Medical management  Impression/Recommendations Active Problems:   Spinal stenosis    1. Uncontrolled hypertension.  Seems related to pain.  Make some changes to patient BP meds currently patient is on bisoprolol, HCTZ and losartan.  We will change bisoprolol to metoprolol, and keep losartan, and change HCTZ to amlodipine given the patient has CKD and hyponatremia.  Adjust according to response 2. Borderline diabetes, most recent A1c last year 6.7, will send a repeat A1c.  May need insulin for tighter sugar control for wound healing 3. BPH, continue Flomax 4. CKD stage II with non-anion gap metabolic acidosis, repeat BMP, consider starting bicarb p.o. 5. Abnormal prostatic hip, show some lucency around prosthetic hip joints, will send a vitamin D level given the patient had prolonged CKD may have chronic vitamin D deficiency.  Orthopedic surgery also consulted 6. Spinal stenosis with early signs of spinal cord impingement.  As per primary team, patient on Decadron.  Patient used to be healthy and physically active until 3 months ago, he basically tolerated 4 METS until 4 months ago, no chest pain no short of breath.  Medically cleared for general anesthesia and spinal surgery with acceptable risk.  Order EKG, continue preop beta-blocker.  I will followup again tomorrow. Please contact me if I can be of assistance in the meanwhile. Thank you for this consultation.  Chief Complaint: Both leg weak  HPI: 79 y/o with PMH of HTN, pre-DM, CKD stage II, obese male, L2-3 posterior lumbar fusion in 2020, left Hip OA s/p ORIF, total hip arthroplasty and revision who presented with worsening of low back pain and B/L weakness.  Patient reported has had bilateral leg weakness for the last  few months, with feeling of heaviness on ambulate for more than 5 minutes.  He went to see neurosurgery was told may have claudication due to spinal stenosis.  Last 3 days, patient experienced worsening of bilateral leg weakness, could not stand up straight, has to crawling on the floor.  Patient denied any joint or muscle pain, denied any perineal area numbness, no trouble urinate or bowel movement.  His exam was concerning for cord impingement and he was transferred to [facility] for MRI of his thoracic and lumbar spine. He reports an increase in his lowe back pain over the last three months. Lumbar MRI revealed L3-5 spinal stenosis.   Review of Systems:  10 points of review system negative except those mentioned in HPI.  Past Medical History:  Diagnosis Date  . Arthritis    in left hip  . ED (erectile dysfunction)   . Enlarged prostate with lower urinary tract symptoms (LUTS)   . GERD (gastroesophageal reflux disease)   . History of chronic gastritis   . History of colon polyps    hyperplastic 2006  . History of esophageal stricture    S/P  DILATATION 2009; 2010; 2010 2012  . History of kidney stones    hx. multiple kidney stones  . History of kidney stones   . History of primary hyperparathyroidism    s/p  left superior parathyroidectomy 07-10-2014  . Hyperlipidemia   . Hypertension   . Ileus ([place]) 05/02/2017  . Left ureteral stone   . Pre-diabetes   . Sepsis secondary to UTI ([place]) 05/02/2017   Past Surgical History:  Procedure Laterality Date  . BACK  SURGERY  04/2018   fusion L2 and L3  . COLONOSCOPY  02/17/2005  . CYSTO/  LEFT RETROGRADE PYELOGRAM/ STENT PLACEMENT  01/26/2005  . CYSTOSCOPY W/ URETEROSCOPY W/ LITHOTRIPSY Left 05/04/2005  . CYSTOSCOPY WITH STENT PLACEMENT Left 12/30/2015   Procedure: CYSTOSCOPY WITH STENT PLACEMENT;  Surgeon: Franchot Gallo, MD;  Location: [facility];  Service: Urology;  Laterality: Left;  .  CYSTOSCOPY/RETROGRADE/URETEROSCOPY/STONE EXTRACTION WITH BASKET Left 12/30/2015   Procedure: CYSTOSCOPY/RETROGRADE/URETEROSCOPY/STONE EXTRACTION WITH BASKET;  Surgeon: Franchot Gallo, MD;  Location: [facility];  Service: Urology;  Laterality: Left;  . CYSTOSCOPY/URETEROSCOPY/HOLMIUM LASER/STENT PLACEMENT Left 03/09/2016   Procedure: CYSTOSCOPY/RETROGRADE PYELOGRAM/URETEROSCOPY/BASKET STONE EXTRACTION/STENT PLACEMENT;  Surgeon: Franchot Gallo, MD;  Location: [facility];  Service: Urology;  Laterality: Left;  . ESOPHAGOGASTRODUODENOSCOPY (EGD) WITH ESOPHAGEAL DILATION  x4  last one 07-21-2010  . HOLMIUM LASER APPLICATION Left 77/82/4235   Procedure: HOLMIUM LASER APPLICATION;  Surgeon: Franchot Gallo, MD;  Location: [facility];  Service: Urology;  Laterality: Left;  . INGUINAL HERNIA REPAIR Left yrs ago  . LUMBAR LAMINECTOMY/DECOMPRESSION MICRODISCECTOMY Left 11/09/2017   Procedure: Left Lumbar Two-Three Microdiscectomy;  Surgeon: Kristeen Miss, MD;  Location: [place];  Service: Neurosurgery;  Laterality: Left;  Left Lumbar Two-Three Microdiscectomy  . NEPHROLITHOTOMY Left 02/01/2014   Procedure: NEPHROLITHOTOMY PERCUTANEOUS;  Surgeon: Jorja Loa, MD;  Location: [facility];  Service: Urology;  Laterality: Left;  . ORIF HIP FRACTURE Left 04/16/2017   Procedure: OPEN REDUCTION INTERNAL FIXATION HIP GREATER TROCHANTER;  Surgeon: Frederik Pear, MD;  Location: [place];  Service: Orthopedics;  Laterality: Left;  . PARATHYROIDECTOMY Left 07/10/2014   Procedure: LEFT SUPERIOR PARATHYROIDECTOMY;  Surgeon: Armandina Gemma, MD;  Location: [place];  Service: General;  Laterality: Left;  . TOTAL HIP ARTHROPLASTY Left 06/24/2016   Procedure: LEFT TOTAL HIP ARTHROPLASTY ANTERIOR APPROACH;  Surgeon: Gaynelle Arabian, MD;  Location: [facility];  Service: Orthopedics;  Laterality: Left;  . TOTAL HIP REVISION Left 11/18/2016   Procedure: Left femoral revision - posterior approach;  Surgeon: Gaynelle Arabian, MD;  Location: [facility];  Service: Orthopedics;  Laterality: Left;  . tumor ear Left age 89    topical growth behind left ear.   Social History:  reports that he quit smoking about 45 years ago. He quit after 8.00 years of use. He has never used smokeless tobacco. He reports current alcohol use of about 1.0 - 2.0 standard drink of alcohol per week. He reports that he does not use drugs.  Allergies  Allergen Reactions  . Iodinated Diagnostic Agents Shortness Of Breath  . Iohexol Shortness Of Breath   Family History  Problem Relation Age of Onset  . Heart disease Mother   . Alzheimer's disease Mother   . Heart disease Father   . Bladder Cancer Sister   . Colon cancer Neg Hx   . Esophageal cancer Neg Hx   . Stomach cancer Neg Hx   . Rectal cancer Neg Hx     Prior to Admission medications   Medication Sig Start Date End Date Taking? Authorizing Provider  aspirin EC 81 MG tablet Take 81 mg by mouth daily.   Yes [provider]  b complex vitamins tablet Take 1 tablet by mouth daily.   Yes [provider]  bisoprolol-hydrochlorothiazide (ZIAC) 5-6.25 MG tablet Take 1 tablet Daily for BP Patient taking differently: Take 1 tablet by mouth in the morning. Take 1 tablet Daily for BP 11/03/18  Yes Unk Pinto, MD  Cholecalciferol (VITAMIN D  PO) Take 5,000 Units by mouth daily.    Yes [provider]  fenofibrate (TRICOR) 145 MG tablet Take 1 tablet Daily for Triglycerides (Blood Fats) Patient taking differently: Take 145 mg by mouth daily. For triglycerides (blood fats) 10/17/18  Yes Unk Pinto, MD  finasteride (PROSCAR) 5 MG tablet Take 5 mg by mouth daily.   Yes [provider]  Flaxseed, Linseed, (FLAX SEEDS PO) Take 1,000 mg by mouth daily.    Yes [provider]  losartan (COZAAR) 100 MG tablet TAKE 1 TABLET BY MOUTH DAILY FOR BLOOD PRESSURE Patient taking differently: Take 100 mg by mouth every evening.  05/03/19  Yes McClanahan,  Danton Sewer, NP  Magnesium Oxide 250 MG TABS Take 250 mg by mouth 2 (two) times daily.    Yes [provider]  meloxicam (MOBIC) 15 MG tablet TAKE 1/2 TO 1 TABLET DAILY WITH FOOD FOR PAIN AND INFLAMMATION Patient taking differently: Take 7.5-15 mg by mouth daily.  08/14/19  Yes Unk Pinto, MD  Omega-3 Fatty Acids (FISH OIL) 1200 MG CAPS Take 1,200 mg by mouth 2 (two) times daily.    Yes [provider]  pantoprazole (PROTONIX) 40 MG tablet Take 40 mg by mouth 2 (two) times daily.   Yes [provider]  sildenafil (VIAGRA) 100 MG tablet Take 100 mg by mouth daily as needed for erectile dysfunction. 08/18/19  Yes [provider]  tamsulosin (FLOMAX) 0.4 MG CAPS capsule Take 0.4 mg by mouth daily after breakfast.  11/06/14  Yes [provider]  vitamin B-12 (CYANOCOBALAMIN) 1000 MCG tablet Take 1 tablet (1,000 mcg total) by mouth daily. 05/05/17  Yes Hongalgi, Lenis Dickinson, MD  dexlansoprazole (DEXILANT) 60 MG capsule Take 1 capsule (60 mg total) by mouth daily. Patient not taking: Reported on 09/05/2019 05/25/19   Mauri Pole, MD  erythromycin ophthalmic ointment Apply 1 cm ribbon in affected eye(s) up to 2 times daily for 7 days Patient not taking: Reported on 09/05/2019 12/14/18   Vicie Mutters, PA-C  hyoscyamine (LEVSIN SL) 0.125 MG SL tablet Place 1 tablet (0.125 mg total) under the tongue daily as needed. Patient not taking: Reported on 09/05/2019 05/25/19   Mauri Pole, MD  nitroGLYCERIN (NITROSTAT) 0.3 MG SL tablet Dissolve 1 tablet 3 x /day under tongue Before Meals for Esophageal Spasm Patient not taking: Reported on 09/05/2019 02/06/19   Unk Pinto, MD   Physical Exam: Blood pressure (!) 176/85, pulse (!) 59, temperature 97.8 F (36.6 C), temperature source Oral, resp. rate 17, height 5\' 11"  (1.803 m), weight 115 kg, SpO2 98 %. Vitals:   09/05/19 0530 09/05/19 1444  BP: (!) 168/95 (!) 176/85  Pulse: (!) 56 (!) 59  Resp:    Temp:  97.8 F  (36.6 C)  SpO2: 99% 98%     General: No acute distress  Eyes: HEENT  ENT: No rash no swelling  Neck: Supple no JVD  Cardiovascular: Regular heart rate, no murmurs  Respiratory: Bilateral equal breathing,  Abdomen: Soft, nontender  Skin: No rash no ulcer  Musculoskeletal: No tender normal range of mobility  Psychiatric: Calm  Neurologic: Bilateral leg weakness below the knees  Labs on Admission:  Basic Metabolic Panel: Recent Labs  Lab 09/05/19 0402  NA 132*  K 5.0  CL 103  CO2 18*  GLUCOSE 123*  BUN 22  CREATININE 1.58*  CALCIUM 9.0   Liver Function Tests: Recent Labs  Lab 09/05/19 0402  AST 46*  ALT 23  ALKPHOS 25*  BILITOT 1.1  PROT 6.9  ALBUMIN 3.8   No results for input(s): LIPASE, AMYLASE in the last 168 hours. No results for input(s): AMMONIA in the last 168 hours. CBC: Recent Labs  Lab 09/05/19 0402  WBC 7.0  NEUTROABS 4.1  HGB 12.2*  HCT 39.6  MCV 83.7  PLT 293   Cardiac Enzymes: No results for input(s): CKTOTAL, CKMB, CKMBINDEX, TROPONINI in the last 168 hours. BNP: Invalid input(s): POCBNP CBG: No results for input(s): GLUCAP in the last 168 hours.  Radiological Exams on Admission: MR THORACIC SPINE WO CONTRAST  Result Date: 09/05/2019 CLINICAL DATA:  Chronic pain with difficulty ambulating. EXAM: MRI THORACIC SPINE WITHOUT CONTRAST TECHNIQUE: Multiplanar, multisequence MR imaging of the thoracic spine was performed. No intravenous contrast was administered. COMPARISON:  None. FINDINGS: Alignment:  Exaggerated thoracic kyphosis. Vertebrae: No fracture, evidence of discitis, or bone lesion. Cord:  Normal signal and morphology. Paraspinal and other soft tissues: Partially covered right renal cystic intensity Disc levels: Diffuse disc narrowing and mild to moderate spondylitic spurring. There are central disc protrusions at T5-6, T6-7, T7-8, and T8-9 which contact and deform the ventral cord on axial slices. No complete effacement of  CSF. Overall mild facet spurring. IMPRESSION: 1. No acute finding. 2. Disc degeneration mainly in the midthoracic spine where herniations contact cord from T5-6 to T8-9 3. No high-grade canal or foraminal stenosis. Electronically Signed   By: Monte Fantasia M.D.   On: 09/05/2019 08:10   MR LUMBAR SPINE WO CONTRAST  Result Date: 09/05/2019 CLINICAL DATA:  Progressive low back pain with neurologic deficit EXAM: MRI LUMBAR SPINE WITHOUT CONTRAST TECHNIQUE: Multiplanar, multisequence MR imaging of the lumbar spine was performed. No intravenous contrast was administered. COMPARISON:  Lumbar myelogram 03/24/2018 FINDINGS: Segmentation:  Standard lumbar numbering Alignment:  Trace L5-S1 anterolisthesis. Vertebrae: No fracture, evidence of discitis, or bone lesion. Solid arthrodesis at L2-3. Conus medullaris and cauda equina: Conus extends to the T12-L1 level. Conus and cauda equina appear normal. Paraspinal and other soft tissues: No acute finding. Renal cysts and colonic diverticulosis. Disc levels: T12- L1: Spondylosis.  No impingement L1-L2: Spondylosis and posterior element hypertrophy. No neural impingement L2-L3: Interval PLIF with solid arthrodesis. No residual impingement L3-L4: Prominent facet degeneration with spurring and marked ligamentum flavum thickening. The disc is bulging with a broad central protrusion based on sagittal images. Advanced spinal stenosis with complete effacement of CSF. The foramina are patent L4-L5: Advanced facet osteoarthritis with bulky bony and ligamentous hypertrophy. Moderate spinal stenosis, especially at the subarticular recesses L5-S1:Degenerative facet spurring which is bulky. Negative disc space. No impingement IMPRESSION: 1. L3-4 interval adjacent segment degeneration with advanced spinal stenosis. 2. L4-5 moderate spinal stenosis primarily from posterior element hypertrophy. 3. L2-3 interval PLIF with solid arthrodesis and no residual impingement. Electronically Signed    By: Monte Fantasia M.D.   On: 09/05/2019 07:52   DG Hip Unilat With Pelvis 2-3 Views Left  Result Date: 09/05/2019 CLINICAL DATA:  Hip pain since replacement in 1998, now acutely worsening with difficulty bearing weight EXAM: DG HIP (WITH OR WITHOUT PELVIS) 2-3V LEFT COMPARISON:  Radiograph 05/02/2017, CT 03/23/2017 FINDINGS: Postsurgical changes from prior total left hip arthroplasty secured by a collared femoral stem as well as a side plate and cerclage wire fixation construct. There is extensive periprosthetic lucency about the proximal femoral stem and neck as well as increasing lucency through the inferior aspect of the greater trochanter. Could reflect progressive hardware loosening. Mild lucency along the inferior aspect of the  femoral plate and screw construct is also new since 2019. No acute periprosthetic fracture is evident. Remaining bones of the pelvis appear intact and congruent. Advanced arthrosis at the right hip is noted with large periacetabular osteophytes and stable ossifications. Further degenerative changes in the lower lumbar spine, SI joints and symphysis pubis. Soft tissues are unremarkable. IMPRESSION: 1. Extensive periprosthetic lucency about the proximal left femoral stem and neck as well as increasing lucency through the inferior aspect of the greater trochanter and at the distal extent of the side plate construct. Could reflect progressive hardware loosening. Infection less favored in the absence of other septic features. 2. No acute periprosthetic fracture or other acute osseous abnormality. Electronically Signed   By: Lovena Le M.D.   On: 09/05/2019 03:26    EKG: Ordered  Time spent: [facility] Pager [phone]   09/05/2019, 4:31 PM

## 2019-09-05 NOTE — ED Provider Notes (Signed)
5:46 AM  Patient evaluated.  Transferred from [facility] for MRI T and L-spine.  Reports chronic pain in bilateral hips but recent weakness and difficulty ambulating.  States "I know where I want my legs to go but they won't respond."  Denies any bowel or bladder issues.  History of multiple hip and back surgeries.  Concern for acute cord impingement based on evaluation by primary provider at [facility].  Did not retest gait upon arrival.  Patient hemodynamically stable.  MRI informed of his arrival.   Merryl Hacker, MD 09/05/19 [phone]

## 2019-09-06 DIAGNOSIS — E1121 Type 2 diabetes mellitus with diabetic nephropathy: Secondary | ICD-10-CM

## 2019-09-06 DIAGNOSIS — K219 Gastro-esophageal reflux disease without esophagitis: Secondary | ICD-10-CM

## 2019-09-06 DIAGNOSIS — E782 Mixed hyperlipidemia: Secondary | ICD-10-CM

## 2019-09-06 LAB — BASIC METABOLIC PANEL
Anion gap: 12 (ref 5–15)
BUN: 18 mg/dL (ref 8–23)
CO2: 21 mmol/L — ABNORMAL LOW (ref 22–32)
Calcium: 9.4 mg/dL (ref 8.9–10.3)
Chloride: 101 mmol/L (ref 98–111)
Creatinine, Ser: 1.47 mg/dL — ABNORMAL HIGH (ref 0.61–1.24)
GFR calc Af Amer: 53 mL/min — ABNORMAL LOW (ref >60)
GFR calc non Af Amer: 46 mL/min — ABNORMAL LOW (ref >60)
Glucose, Bld: 152 mg/dL — ABNORMAL HIGH (ref 70–99)
Potassium: 4.4 mmol/L (ref 3.5–5.1)
Sodium: 134 mmol/L — ABNORMAL LOW (ref 135–145)

## 2019-09-06 LAB — TYPE AND SCREEN
ABO/RH(D): A POS
Antibody Screen: NEGATIVE

## 2019-09-06 LAB — GLUCOSE, CAPILLARY
Glucose-Capillary: 116 mg/dL — ABNORMAL HIGH (ref 70–99)
Glucose-Capillary: 148 mg/dL — ABNORMAL HIGH (ref 70–99)
Glucose-Capillary: 153 mg/dL — ABNORMAL HIGH (ref 70–99)

## 2019-09-06 LAB — SEDIMENTATION RATE: Sed Rate: 6 mm/hr (ref 0–16)

## 2019-09-06 LAB — HEMOGLOBIN A1C
Hgb A1c MFr Bld: 6.6 % — ABNORMAL HIGH (ref 4.8–5.6)
Mean Plasma Glucose: 142.72 mg/dL

## 2019-09-06 LAB — C-REACTIVE PROTEIN: CRP: <0.5 mg/dL (ref <1.0)

## 2019-09-06 MED ORDER — CEFAZOLIN SODIUM-DEXTROSE 2-4 GM/100ML-% IV SOLN
2.0000 g | INTRAVENOUS | Status: AC
  Administered 2019-09-07: 2 g via INTRAVENOUS
  Filled 2019-09-06: qty 100

## 2019-09-06 MED ORDER — INSULIN ASPART 100 UNIT/ML ~~LOC~~ SOLN
0.0000 [IU] | Freq: Three times a day (TID) | SUBCUTANEOUS | Status: DC
  Administered 2019-09-06 – 2019-09-08 (×3): 2 [IU] via SUBCUTANEOUS
  Administered 2019-09-08 – 2019-09-09 (×2): 1 [IU] via SUBCUTANEOUS
  Administered 2019-09-09: 2 [IU] via SUBCUTANEOUS
  Administered 2019-09-09 – 2019-09-10 (×2): 1 [IU] via SUBCUTANEOUS
  Administered 2019-09-10: 2 [IU] via SUBCUTANEOUS
  Administered 2019-09-11 (×3): 1 [IU] via SUBCUTANEOUS
  Administered 2019-09-12: 2 [IU] via SUBCUTANEOUS
  Administered 2019-09-12 – 2019-09-13 (×2): 1 [IU] via SUBCUTANEOUS
  Administered 2019-09-14: 2 [IU] via SUBCUTANEOUS
  Administered 2019-09-14 – 2019-09-17 (×6): 1 [IU] via SUBCUTANEOUS

## 2019-09-06 MED ORDER — CHLORHEXIDINE GLUCONATE CLOTH 2 % EX PADS
6.0000 | MEDICATED_PAD | Freq: Once | CUTANEOUS | Status: AC
  Administered 2019-09-07: 6 via TOPICAL

## 2019-09-06 MED ORDER — CHLORHEXIDINE GLUCONATE CLOTH 2 % EX PADS: 6.0000 | MEDICATED_PAD | Freq: Once | CUTANEOUS | Status: DC

## 2019-09-06 MED ORDER — POLYVINYL ALCOHOL 1.4 % OP SOLN
1.0000 [drp] | OPHTHALMIC | Status: DC | PRN
  Filled 2019-09-06: qty 15

## 2019-09-06 MED ORDER — INSULIN ASPART 100 UNIT/ML ~~LOC~~ SOLN: 0.0000 [IU] | Freq: Every day | SUBCUTANEOUS | Status: DC

## 2019-09-06 MED ORDER — SENNOSIDES-DOCUSATE SODIUM 8.6-50 MG PO TABS: 2.0000 | ORAL_TABLET | Freq: Every evening | ORAL | Status: DC | PRN

## 2019-09-06 MED ORDER — POLYETHYLENE GLYCOL 3350 17 G PO PACK: 17.0000 g | PACK | Freq: Every day | ORAL | Status: DC | PRN

## 2019-09-06 NOTE — Progress Notes (Signed)
PROGRESS NOTE    [name] [name]  GUR:[id number] DOB: 1940-04-16 DOA: 09/05/2019 PCP: Unk Pinto, MD   Brief Narrative:  79 y/o with PMH of HTN, pre-DM, CKD stage II, obese male, L2-3 posterior lumbar fusion in 2020, left Hip OA s/p ORIF, total hip arthroplasty and revision who presented with worsening of low back pain and B/L weakness. His exam was concerning for cord impingement and he was transferred to [facility] for MRI of his thoracic and lumbar spine. He reports an increase in his lowe back pain over the last three months. Lumbar MRI revealedL3-5 spinal stenosis.   Assessment & Plan:   Active Problems:   Spinal stenosis  Uncontrolled essential hypertension.   -Currently improved continue Norvasc 5 mg daily, losartan 100 mg daily, metoprolol 12.5 mg p.o. twice daily  Diabetes mellitus type 2, hemoglobin A1c 6.6 -Continue insulin sliding scale and Accu-Chek  BPH -Flomax  CKD stage II with non-anion gap metabolic acidosis baseline creatinine around 1.4. repeat BMP  Abnormal prostatic hip, show some lucency around prosthetic hip joints Ortho consulted, vitamin D normal limits.  CRP negative.  Spinal stenosis with early signs of spinal cord impingement.   Management per primary team.   DVT prophylaxis: Per primary team Code Status: Full code   Body mass index is 35.36 kg/m.      Subjective: No complaints at this time, concerned about his blood pressure.  Review of Systems Otherwise negative except as per HPI, including: General: Denies fever, chills, night sweats or unintended weight loss. Resp: Denies cough, wheezing, shortness of breath. Cardiac: Denies chest pain, palpitations, orthopnea, paroxysmal nocturnal dyspnea. GI: Denies abdominal pain, nausea, vomiting, diarrhea or constipation GU: Denies dysuria, frequency, hesitancy or incontinence MS: Denies muscle aches, joint pain or swelling Neuro: Denies headache, neurologic deficits (focal weakness,  numbness, tingling), abnormal gait Psych: Denies anxiety, depression, SI/HI/AVH Skin: Denies new rashes or lesions ID: Denies sick contacts, exotic exposures, travel  Examination:  General exam: Appears calm and comfortable  Respiratory system: Clear to auscultation. Respiratory effort normal. Cardiovascular system: S1 & S2 heard, RRR. No JVD, murmurs, rubs, gallops or clicks. No pedal edema. Gastrointestinal system: Abdomen is nondistended, soft and nontender. No organomegaly or masses felt. Normal bowel sounds heard. Central nervous system: Alert and oriented. No focal neurological deficits. Extremities: Symmetric 5 x 5 power. Skin: No rashes, lesions or ulcers Psychiatry: Judgement and insight appear normal. Mood & affect appropriate.     Objective: Vitals:   09/05/19 2018 09/06/19 0009 09/06/19 0402 09/06/19 0752  BP: 135/83 115/69 130/81 104/73  Pulse: 86 78 64 71  Resp: 16 16 18 16   Temp: 98.2 F (36.8 C) 97.8 F (36.6 C) 97.7 F (36.5 C) 99.1 F (37.3 C)  TempSrc: Oral Oral Oral Oral  SpO2: 95% 96% 97% 99%  Weight:      Height:        Intake/Output Summary (Last 24 hours) at 09/06/2019 1141 Last data filed at 09/06/2019 1000 Gross per 24 hour  Intake 490 ml  Output 5 ml  Net 485 ml   Filed Weights   09/05/19 0245  Weight: 115 kg     Data Reviewed:   CBC: Recent Labs  Lab 09/05/19 0402  WBC 7.0  NEUTROABS 4.1  HGB 12.2*  HCT 39.6  MCV 83.7  PLT 283   Basic Metabolic Panel: Recent Labs  Lab 09/05/19 0402 09/06/19 0448  NA 132* 134*  K 5.0 4.4  CL 103 101  CO2 18* 21*  GLUCOSE 123* 152*  BUN 22 18  CREATININE 1.58* 1.47*  CALCIUM 9.0 9.4   GFR: Estimated Creatinine Clearance: 55.1 mL/min (A) (by C-G formula based on SCr of 1.47 mg/dL (H)). Liver Function Tests: Recent Labs  Lab 09/05/19 0402  AST 46*  ALT 23  ALKPHOS 25*  BILITOT 1.1  PROT 6.9  ALBUMIN 3.8   No results for input(s): LIPASE, AMYLASE in the last 168 hours. No  results for input(s): AMMONIA in the last 168 hours. Coagulation Profile: No results for input(s): INR, PROTIME in the last 168 hours. Cardiac Enzymes: No results for input(s): CKTOTAL, CKMB, CKMBINDEX, TROPONINI in the last 168 hours. BNP (last 3 results) No results for input(s): PROBNP in the last 8760 hours. HbA1C: Recent Labs    09/06/19 0448  HGBA1C 6.6*   CBG: No results for input(s): GLUCAP in the last 168 hours. Lipid Profile: No results for input(s): CHOL, HDL, LDLCALC, TRIG, CHOLHDL, LDLDIRECT in the last 72 hours. Thyroid Function Tests: No results for input(s): TSH, T4TOTAL, FREET4, T3FREE, THYROIDAB in the last 72 hours. Anemia Panel: No results for input(s): VITAMINB12, FOLATE, FERRITIN, TIBC, IRON, RETICCTPCT in the last 72 hours. Sepsis Labs: No results for input(s): PROCALCITON, LATICACIDVEN in the last 168 hours.  Recent Results (from the past 240 hour(s))  SARS Coronavirus 2 by RT PCR (hospital order, performed in [facility] hospital lab) Nasopharyngeal Nasopharyngeal Swab     Status: None   Collection Time: 09/05/19  4:01 AM   Specimen: Nasopharyngeal Swab  Result Value Ref Range Status   SARS Coronavirus 2 NEGATIVE NEGATIVE Final    Comment: (NOTE) SARS-CoV-2 target nucleic acids are NOT DETECTED.  The SARS-CoV-2 RNA is generally detectable in upper and lower respiratory specimens during the acute phase of infection. The lowest concentration of SARS-CoV-2 viral copies this assay can detect is 250 copies / mL. A negative result does not preclude SARS-CoV-2 infection and should not be used as the sole basis for treatment or other patient management decisions.  A negative result may occur with improper specimen collection / handling, submission of specimen other than nasopharyngeal swab, presence of viral mutation(s) within the areas targeted by this assay, and inadequate number of viral copies (<250 copies / mL). A negative result must be combined with  clinical observations, patient history, and epidemiological information.  Fact Sheet for Patients:   StrictlyIdeas.no  Fact Sheet for Healthcare Providers: BankingDealers.co.za  This test is not yet approved or  cleared by the [place] FDA and has been authorized for detection and/or diagnosis of SARS-CoV-2 by FDA under an Emergency Use Authorization (EUA).  This EUA will remain in effect (meaning this test can be used) for the duration of the COVID-19 declaration under Section 564(b)(1) of the Act, 21 U.S.C. section 360bbb-3(b)(1), unless the authorization is terminated or revoked sooner.  Performed at [facility], [place] [address] [place], [place] [address]          Radiology Studies: MR THORACIC SPINE WO CONTRAST  Result Date: 09/05/2019 CLINICAL DATA:  Chronic pain with difficulty ambulating. EXAM: MRI THORACIC SPINE WITHOUT CONTRAST TECHNIQUE: Multiplanar, multisequence MR imaging of the thoracic spine was performed. No intravenous contrast was administered. COMPARISON:  None. FINDINGS: Alignment:  Exaggerated thoracic kyphosis. Vertebrae: No fracture, evidence of discitis, or bone lesion. Cord:  Normal signal and morphology. Paraspinal and other soft tissues: Partially covered right renal cystic intensity Disc levels: Diffuse disc narrowing and mild to moderate spondylitic spurring. There are central disc  protrusions at T5-6, T6-7, T7-8, and T8-9 which contact and deform the ventral cord on axial slices. No complete effacement of CSF. Overall mild facet spurring. IMPRESSION: 1. No acute finding. 2. Disc degeneration mainly in the midthoracic spine where herniations contact cord from T5-6 to T8-9 3. No high-grade canal or foraminal stenosis. Electronically Signed   By: Monte Fantasia M.D.   On: 09/05/2019 08:10   MR LUMBAR SPINE WO CONTRAST  Result Date: 09/05/2019 CLINICAL DATA:  Progressive low back pain with  neurologic deficit EXAM: MRI LUMBAR SPINE WITHOUT CONTRAST TECHNIQUE: Multiplanar, multisequence MR imaging of the lumbar spine was performed. No intravenous contrast was administered. COMPARISON:  Lumbar myelogram 03/24/2018 FINDINGS: Segmentation:  Standard lumbar numbering Alignment:  Trace L5-S1 anterolisthesis. Vertebrae: No fracture, evidence of discitis, or bone lesion. Solid arthrodesis at L2-3. Conus medullaris and cauda equina: Conus extends to the T12-L1 level. Conus and cauda equina appear normal. Paraspinal and other soft tissues: No acute finding. Renal cysts and colonic diverticulosis. Disc levels: T12- L1: Spondylosis.  No impingement L1-L2: Spondylosis and posterior element hypertrophy. No neural impingement L2-L3: Interval PLIF with solid arthrodesis. No residual impingement L3-L4: Prominent facet degeneration with spurring and marked ligamentum flavum thickening. The disc is bulging with a broad central protrusion based on sagittal images. Advanced spinal stenosis with complete effacement of CSF. The foramina are patent L4-L5: Advanced facet osteoarthritis with bulky bony and ligamentous hypertrophy. Moderate spinal stenosis, especially at the subarticular recesses L5-S1:Degenerative facet spurring which is bulky. Negative disc space. No impingement IMPRESSION: 1. L3-4 interval adjacent segment degeneration with advanced spinal stenosis. 2. L4-5 moderate spinal stenosis primarily from posterior element hypertrophy. 3. L2-3 interval PLIF with solid arthrodesis and no residual impingement. Electronically Signed   By: Monte Fantasia M.D.   On: 09/05/2019 07:52   DG Hip Unilat With Pelvis 2-3 Views Left  Result Date: 09/05/2019 CLINICAL DATA:  Hip pain since replacement in 1998, now acutely worsening with difficulty bearing weight EXAM: DG HIP (WITH OR WITHOUT PELVIS) 2-3V LEFT COMPARISON:  Radiograph 05/02/2017, CT 03/23/2017 FINDINGS: Postsurgical changes from prior total left hip arthroplasty  secured by a collared femoral stem as well as a side plate and cerclage wire fixation construct. There is extensive periprosthetic lucency about the proximal femoral stem and neck as well as increasing lucency through the inferior aspect of the greater trochanter. Could reflect progressive hardware loosening. Mild lucency along the inferior aspect of the femoral plate and screw construct is also new since 2019. No acute periprosthetic fracture is evident. Remaining bones of the pelvis appear intact and congruent. Advanced arthrosis at the right hip is noted with large periacetabular osteophytes and stable ossifications. Further degenerative changes in the lower lumbar spine, SI joints and symphysis pubis. Soft tissues are unremarkable. IMPRESSION: 1. Extensive periprosthetic lucency about the proximal left femoral stem and neck as well as increasing lucency through the inferior aspect of the greater trochanter and at the distal extent of the side plate construct. Could reflect progressive hardware loosening. Infection less favored in the absence of other septic features. 2. No acute periprosthetic fracture or other acute osseous abnormality. Electronically Signed   By: Lovena Le M.D.   On: 09/05/2019 03:26        Scheduled Meds: . amLODipine  5 mg Oral Daily  . docusate sodium  100 mg Oral BID  . fenofibrate  160 mg Oral Daily  . finasteride  5 mg Oral Daily  . losartan  100 mg Oral Daily  .  magnesium oxide  200 mg Oral BID  . metoprolol tartrate  12.5 mg Oral BID  . pantoprazole  40 mg Oral Daily  . tamsulosin  0.4 mg Oral QPC breakfast   Continuous Infusions: . methocarbamol (ROBAXIN) IV       LOS: 0 days   Time spent= 35 mins    Edson Deridder Arsenio Loader, MD [facility]  If 7PM-7AM, please contact night-coverage  09/06/2019, 11:41 AM

## 2019-09-06 NOTE — Progress Notes (Signed)
Patient ID: [name] [name], male   DOB: Jan 17, 1941, 79 y.o.   Orthopedic note:  Mr. Banko underwent primary left total hip by another physician in April 2018, unfortunately subsided and he had a revision with placement of cables in September 2018. The greater trochanter fractured off and retracted 5 centimeters taking of the abductor mechanism with it.  I first met the patient in January 2019 and in February 2019 he underwent open reduction internal fixation of the greater trochanteric fracture with a long cable plate and multiple cerclage wires.  The patient has maintained good fixation on postoperative radiographs the last ones done in our office were in March 2021.  Patient was admitted for decompression of severe central spinal stenosis a few days ago and radiographs taken at the time of admission were read as showing possible separation of the cable plate from the femur.  I have personally reviewed the x-rays from this admission and compared them to x-rays that were done a few months ago as well as his initial postoperative films and there has been no change in position of the cable plate or loss of fixation.  In addition none of the [place] cables have broken.  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  Labs obtained on this admission show a normal peripheral white count a sed rate of 6 and a CRP of 0.6 making any periprosthetic infection highly unlikely.  This information was relayed to Mr. Hobdy by telephone today.  I wish him the best of luck with his surgical decompression.  We will follow-up in my office as previously scheduled in approximately 3 to 4 months.  Kathalene Frames. Mayer Camel, MD [phone]

## 2019-09-07 ENCOUNTER — Inpatient Hospital Stay (HOSPITAL_COMMUNITY)
Admission: EM | Disposition: A | Discharge status: Home / Self Care | Payer: Self-pay | Source: Home / Self Care | Attending: Neurological Surgery

## 2019-09-07 ENCOUNTER — Encounter (HOSPITAL_COMMUNITY): Payer: Self-pay | Admitting: Internal Medicine

## 2019-09-07 ENCOUNTER — Inpatient Hospital Stay (HOSPITAL_COMMUNITY): Payer: Medicare Other

## 2019-09-07 ENCOUNTER — Inpatient Hospital Stay (HOSPITAL_COMMUNITY): Payer: Medicare Other | Admitting: Certified Registered"

## 2019-09-07 DIAGNOSIS — M4800 Spinal stenosis, site unspecified: Secondary | ICD-10-CM

## 2019-09-07 DIAGNOSIS — M25552 Pain in left hip: Secondary | ICD-10-CM | POA: Diagnosis not present

## 2019-09-07 DIAGNOSIS — E872 Acidosis: Secondary | ICD-10-CM | POA: Diagnosis present

## 2019-09-07 DIAGNOSIS — M48061 Spinal stenosis, lumbar region without neurogenic claudication: Secondary | ICD-10-CM | POA: Diagnosis not present

## 2019-09-07 DIAGNOSIS — Z820 Family history of epilepsy and other diseases of the nervous system: Secondary | ICD-10-CM | POA: Diagnosis not present

## 2019-09-07 DIAGNOSIS — M1612 Unilateral primary osteoarthritis, left hip: Secondary | ICD-10-CM | POA: Diagnosis present

## 2019-09-07 DIAGNOSIS — Z8052 Family history of malignant neoplasm of bladder: Secondary | ICD-10-CM | POA: Diagnosis not present

## 2019-09-07 DIAGNOSIS — I129 Hypertensive chronic kidney disease with stage 1 through stage 4 chronic kidney disease, or unspecified chronic kidney disease: Secondary | ICD-10-CM | POA: Diagnosis not present

## 2019-09-07 DIAGNOSIS — Z888 Allergy status to other drugs, medicaments and biological substances status: Secondary | ICD-10-CM | POA: Diagnosis not present

## 2019-09-07 DIAGNOSIS — E871 Hypo-osmolality and hyponatremia: Secondary | ICD-10-CM | POA: Diagnosis present

## 2019-09-07 DIAGNOSIS — Z6835 Body mass index (BMI) 35.0-35.9, adult: Secondary | ICD-10-CM | POA: Diagnosis not present

## 2019-09-07 DIAGNOSIS — Z79899 Other long term (current) drug therapy: Secondary | ICD-10-CM | POA: Diagnosis not present

## 2019-09-07 DIAGNOSIS — E1169 Type 2 diabetes mellitus with other specified complication: Secondary | ICD-10-CM | POA: Diagnosis not present

## 2019-09-07 DIAGNOSIS — N400 Enlarged prostate without lower urinary tract symptoms: Secondary | ICD-10-CM | POA: Diagnosis present

## 2019-09-07 DIAGNOSIS — E782 Mixed hyperlipidemia: Secondary | ICD-10-CM | POA: Diagnosis not present

## 2019-09-07 DIAGNOSIS — M4726 Other spondylosis with radiculopathy, lumbar region: Secondary | ICD-10-CM | POA: Diagnosis not present

## 2019-09-07 DIAGNOSIS — I10 Essential (primary) hypertension: Secondary | ICD-10-CM | POA: Diagnosis not present

## 2019-09-07 DIAGNOSIS — N1832 Chronic kidney disease, stage 3b: Secondary | ICD-10-CM | POA: Diagnosis not present

## 2019-09-07 DIAGNOSIS — Z96642 Presence of left artificial hip joint: Secondary | ICD-10-CM | POA: Diagnosis present

## 2019-09-07 DIAGNOSIS — Z981 Arthrodesis status: Secondary | ICD-10-CM | POA: Diagnosis not present

## 2019-09-07 DIAGNOSIS — Z8249 Family history of ischemic heart disease and other diseases of the circulatory system: Secondary | ICD-10-CM | POA: Diagnosis not present

## 2019-09-07 DIAGNOSIS — N179 Acute kidney failure, unspecified: Secondary | ICD-10-CM | POA: Diagnosis not present

## 2019-09-07 DIAGNOSIS — Z7982 Long term (current) use of aspirin: Secondary | ICD-10-CM | POA: Diagnosis not present

## 2019-09-07 DIAGNOSIS — M48062 Spinal stenosis, lumbar region with neurogenic claudication: Secondary | ICD-10-CM | POA: Diagnosis not present

## 2019-09-07 DIAGNOSIS — M5416 Radiculopathy, lumbar region: Secondary | ICD-10-CM | POA: Diagnosis present

## 2019-09-07 DIAGNOSIS — E785 Hyperlipidemia, unspecified: Secondary | ICD-10-CM | POA: Diagnosis present

## 2019-09-07 DIAGNOSIS — K222 Esophageal obstruction: Secondary | ICD-10-CM | POA: Diagnosis present

## 2019-09-07 DIAGNOSIS — Z20822 Contact with and (suspected) exposure to covid-19: Secondary | ICD-10-CM | POA: Diagnosis present

## 2019-09-07 DIAGNOSIS — Z87891 Personal history of nicotine dependence: Secondary | ICD-10-CM | POA: Diagnosis not present

## 2019-09-07 DIAGNOSIS — I509 Heart failure, unspecified: Secondary | ICD-10-CM | POA: Diagnosis not present

## 2019-09-07 DIAGNOSIS — M25551 Pain in right hip: Secondary | ICD-10-CM | POA: Diagnosis not present

## 2019-09-07 DIAGNOSIS — N183 Chronic kidney disease, stage 3 unspecified: Secondary | ICD-10-CM | POA: Diagnosis not present

## 2019-09-07 DIAGNOSIS — M47816 Spondylosis without myelopathy or radiculopathy, lumbar region: Secondary | ICD-10-CM | POA: Diagnosis present

## 2019-09-07 DIAGNOSIS — K219 Gastro-esophageal reflux disease without esophagitis: Secondary | ICD-10-CM | POA: Diagnosis not present

## 2019-09-07 DIAGNOSIS — E669 Obesity, unspecified: Secondary | ICD-10-CM | POA: Diagnosis present

## 2019-09-07 DIAGNOSIS — I130 Hypertensive heart and chronic kidney disease with heart failure and stage 1 through stage 4 chronic kidney disease, or unspecified chronic kidney disease: Secondary | ICD-10-CM | POA: Diagnosis not present

## 2019-09-07 DIAGNOSIS — M4326 Fusion of spine, lumbar region: Secondary | ICD-10-CM | POA: Diagnosis not present

## 2019-09-07 HISTORY — PX: POSTERIOR LUMBAR FUSION: SHX6036

## 2019-09-07 LAB — GLUCOSE, CAPILLARY
Glucose-Capillary: 101 mg/dL — ABNORMAL HIGH (ref 70–99)
Glucose-Capillary: 100 mg/dL — ABNORMAL HIGH (ref 70–99)
Glucose-Capillary: 99 mg/dL (ref 70–99)
Glucose-Capillary: 123 mg/dL — ABNORMAL HIGH (ref 70–99)
Glucose-Capillary: 142 mg/dL — ABNORMAL HIGH (ref 70–99)
Glucose-Capillary: 165 mg/dL — ABNORMAL HIGH (ref 70–99)

## 2019-09-07 LAB — BASIC METABOLIC PANEL
Anion gap: 10 (ref 5–15)
BUN: 25 mg/dL — ABNORMAL HIGH (ref 8–23)
CO2: 22 mmol/L (ref 22–32)
Calcium: 9.2 mg/dL (ref 8.9–10.3)
Chloride: 104 mmol/L (ref 98–111)
Creatinine, Ser: 1.61 mg/dL — ABNORMAL HIGH (ref 0.61–1.24)
GFR calc Af Amer: 47 mL/min — ABNORMAL LOW (ref >60)
GFR calc non Af Amer: 41 mL/min — ABNORMAL LOW (ref >60)
Glucose, Bld: 148 mg/dL — ABNORMAL HIGH (ref 70–99)
Potassium: 3.7 mmol/L (ref 3.5–5.1)
Sodium: 136 mmol/L (ref 135–145)

## 2019-09-07 LAB — CBC
HCT: 37.6 % — ABNORMAL LOW (ref 39.0–52.0)
Hemoglobin: 11.8 g/dL — ABNORMAL LOW (ref 13.0–17.0)
MCH: 26 pg (ref 26.0–34.0)
MCHC: 31.4 g/dL (ref 30.0–36.0)
MCV: 83 fL (ref 80.0–100.0)
Platelets: 324 10*3/uL (ref 150–400)
RBC: 4.53 MIL/uL (ref 4.22–5.81)
RDW: 15.7 % — ABNORMAL HIGH (ref 11.5–15.5)
WBC: 8.3 10*3/uL (ref 4.0–10.5)
nRBC: 0 % (ref 0.0–0.2)

## 2019-09-07 LAB — MAGNESIUM: Magnesium: 1.8 mg/dL (ref 1.7–2.4)

## 2019-09-07 LAB — SURGICAL PCR SCREEN
MRSA, PCR: NEGATIVE
Staphylococcus aureus: POSITIVE — AB

## 2019-09-07 SURGERY — POSTERIOR LUMBAR FUSION 2 LEVEL
Anesthesia: General | Site: Back

## 2019-09-07 MED ORDER — FENTANYL CITRATE (PF) 250 MCG/5ML IJ SOLN
INTRAMUSCULAR | Status: AC
  Filled 2019-09-07: qty 5

## 2019-09-07 MED ORDER — OXYCODONE HCL 5 MG/5ML PO SOLN: 5.0000 mg | Freq: Once | ORAL | Status: DC | PRN

## 2019-09-07 MED ORDER — THROMBIN 20000 UNITS EX SOLR
CUTANEOUS | Status: DC | PRN
  Administered 2019-09-07: 20 mL via TOPICAL

## 2019-09-07 MED ORDER — LIDOCAINE-EPINEPHRINE 1 %-1:100000 IJ SOLN
INTRAMUSCULAR | Status: DC | PRN
  Administered 2019-09-07: 5 mL

## 2019-09-07 MED ORDER — FENTANYL CITRATE (PF) 100 MCG/2ML IJ SOLN
25.0000 ug | INTRAMUSCULAR | Status: DC | PRN
  Administered 2019-09-07: 50 ug via INTRAVENOUS

## 2019-09-07 MED ORDER — PHENYLEPHRINE HCL-NACL 10-0.9 MG/250ML-% IV SOLN
INTRAVENOUS | Status: DC | PRN
  Administered 2019-09-07: 25 ug/min via INTRAVENOUS

## 2019-09-07 MED ORDER — THROMBIN 5000 UNITS EX SOLR
OROMUCOSAL | Status: DC | PRN
  Administered 2019-09-07 (×3): 5 mL via TOPICAL

## 2019-09-07 MED ORDER — THROMBIN 5000 UNITS EX SOLR
CUTANEOUS | Status: AC
  Filled 2019-09-07: qty 5000

## 2019-09-07 MED ORDER — THROMBIN 20000 UNITS EX SOLR
CUTANEOUS | Status: AC
  Filled 2019-09-07: qty 20000

## 2019-09-07 MED ORDER — POTASSIUM CHLORIDE CRYS ER 20 MEQ PO TBCR
40.0000 meq | EXTENDED_RELEASE_TABLET | Freq: Once | ORAL | Status: AC
  Administered 2019-09-07: 40 meq via ORAL
  Filled 2019-09-07: qty 2

## 2019-09-07 MED ORDER — LIDOCAINE 2% (20 MG/ML) 5 ML SYRINGE
INTRAMUSCULAR | Status: DC | PRN
  Administered 2019-09-07: 50 mg via INTRAVENOUS

## 2019-09-07 MED ORDER — BUPIVACAINE HCL (PF) 0.5 % IJ SOLN
INTRAMUSCULAR | Status: DC | PRN
  Administered 2019-09-07: 25 mL
  Administered 2019-09-07: 5 mL

## 2019-09-07 MED ORDER — LIDOCAINE 2% (20 MG/ML) 5 ML SYRINGE
INTRAMUSCULAR | Status: AC
  Filled 2019-09-07: qty 5

## 2019-09-07 MED ORDER — FENTANYL CITRATE (PF) 100 MCG/2ML IJ SOLN
INTRAMUSCULAR | Status: AC
  Filled 2019-09-07: qty 2

## 2019-09-07 MED ORDER — DEXAMETHASONE SODIUM PHOSPHATE 10 MG/ML IJ SOLN
INTRAMUSCULAR | Status: DC | PRN
  Administered 2019-09-07: 10 mg via INTRAVENOUS

## 2019-09-07 MED ORDER — ONDANSETRON HCL 4 MG/2ML IJ SOLN
INTRAMUSCULAR | Status: AC
  Filled 2019-09-07: qty 2

## 2019-09-07 MED ORDER — LACTATED RINGERS IV SOLN: INTRAVENOUS | Status: DC

## 2019-09-07 MED ORDER — DEXAMETHASONE SODIUM PHOSPHATE 10 MG/ML IJ SOLN
INTRAMUSCULAR | Status: AC
  Filled 2019-09-07: qty 1

## 2019-09-07 MED ORDER — CHLORHEXIDINE GLUCONATE 0.12 % MT SOLN
OROMUCOSAL | Status: AC
  Administered 2019-09-07: 15 mL via OROMUCOSAL
  Filled 2019-09-07: qty 15

## 2019-09-07 MED ORDER — ONDANSETRON HCL 4 MG/2ML IJ SOLN
INTRAMUSCULAR | Status: DC | PRN
  Administered 2019-09-07: 4 mg via INTRAVENOUS

## 2019-09-07 MED ORDER — EPHEDRINE 5 MG/ML INJ
INTRAVENOUS | Status: AC
  Filled 2019-09-07: qty 10

## 2019-09-07 MED ORDER — EPHEDRINE SULFATE-NACL 50-0.9 MG/10ML-% IV SOSY
PREFILLED_SYRINGE | INTRAVENOUS | Status: DC | PRN
  Administered 2019-09-07: 15 mg via INTRAVENOUS
  Administered 2019-09-07: 10 mg via INTRAVENOUS
  Administered 2019-09-07: 15 mg via INTRAVENOUS

## 2019-09-07 MED ORDER — ROCURONIUM BROMIDE 10 MG/ML (PF) SYRINGE
PREFILLED_SYRINGE | INTRAVENOUS | Status: AC
  Filled 2019-09-07: qty 10

## 2019-09-07 MED ORDER — PROPOFOL 10 MG/ML IV BOLUS
INTRAVENOUS | Status: DC | PRN
  Administered 2019-09-07: 30 mg via INTRAVENOUS
  Administered 2019-09-07: 160 mg via INTRAVENOUS

## 2019-09-07 MED ORDER — ALBUMIN HUMAN 5 % IV SOLN: INTRAVENOUS | Status: DC | PRN

## 2019-09-07 MED ORDER — PHENYLEPHRINE 40 MCG/ML (10ML) SYRINGE FOR IV PUSH (FOR BLOOD PRESSURE SUPPORT)
PREFILLED_SYRINGE | INTRAVENOUS | Status: DC | PRN
  Administered 2019-09-07: 160 ug via INTRAVENOUS
  Administered 2019-09-07 (×2): 120 ug via INTRAVENOUS

## 2019-09-07 MED ORDER — BUPIVACAINE HCL (PF) 0.5 % IJ SOLN
INTRAMUSCULAR | Status: AC
  Filled 2019-09-07: qty 30

## 2019-09-07 MED ORDER — PHENYLEPHRINE 40 MCG/ML (10ML) SYRINGE FOR IV PUSH (FOR BLOOD PRESSURE SUPPORT)
PREFILLED_SYRINGE | INTRAVENOUS | Status: AC
  Filled 2019-09-07: qty 10

## 2019-09-07 MED ORDER — ONDANSETRON HCL 4 MG/2ML IJ SOLN: 4.0000 mg | Freq: Four times a day (QID) | INTRAMUSCULAR | Status: DC | PRN

## 2019-09-07 MED ORDER — ROCURONIUM BROMIDE 10 MG/ML (PF) SYRINGE
PREFILLED_SYRINGE | INTRAVENOUS | Status: DC | PRN
  Administered 2019-09-07: 10 mg via INTRAVENOUS
  Administered 2019-09-07: 20 mg via INTRAVENOUS
  Administered 2019-09-07: 40 mg via INTRAVENOUS
  Administered 2019-09-07: 60 mg via INTRAVENOUS

## 2019-09-07 MED ORDER — MUPIROCIN 2 % EX OINT
1.0000 "application " | TOPICAL_OINTMENT | Freq: Two times a day (BID) | CUTANEOUS | Status: AC
  Administered 2019-09-07 – 2019-09-11 (×8): 1 via NASAL
  Filled 2019-09-07 (×2): qty 22

## 2019-09-07 MED ORDER — SUGAMMADEX SODIUM 200 MG/2ML IV SOLN
INTRAVENOUS | Status: DC | PRN
  Administered 2019-09-07: 200 mg via INTRAVENOUS

## 2019-09-07 MED ORDER — LIDOCAINE-EPINEPHRINE 1 %-1:100000 IJ SOLN
INTRAMUSCULAR | Status: AC
  Filled 2019-09-07: qty 1

## 2019-09-07 MED ORDER — CHLORHEXIDINE GLUCONATE 0.12 % MT SOLN: 15.0000 mL | Freq: Once | OROMUCOSAL | Status: AC

## 2019-09-07 MED ORDER — CEFAZOLIN SODIUM-DEXTROSE 2-4 GM/100ML-% IV SOLN
INTRAVENOUS | Status: AC
  Administered 2019-09-08: 2 g via INTRAVENOUS
  Filled 2019-09-07: qty 100

## 2019-09-07 MED ORDER — 0.9 % SODIUM CHLORIDE (POUR BTL) OPTIME
TOPICAL | Status: DC | PRN
  Administered 2019-09-07: 1000 mL

## 2019-09-07 MED ORDER — SODIUM CHLORIDE 0.9 % IV SOLN
INTRAVENOUS | Status: DC | PRN
  Administered 2019-09-07: 500 mL

## 2019-09-07 MED ORDER — OXYCODONE HCL 5 MG PO TABS: 5.0000 mg | ORAL_TABLET | Freq: Once | ORAL | Status: DC | PRN

## 2019-09-07 MED ORDER — SODIUM CHLORIDE 0.9 % IV SOLN
INTRAVENOUS | Status: DC | PRN
Start: 2019-09-07 — End: 2019-09-07

## 2019-09-07 MED ORDER — FENTANYL CITRATE (PF) 100 MCG/2ML IJ SOLN
INTRAMUSCULAR | Status: DC | PRN
  Administered 2019-09-07 (×4): 50 ug via INTRAVENOUS
  Administered 2019-09-07: 100 ug via INTRAVENOUS

## 2019-09-07 MED ORDER — PROPOFOL 10 MG/ML IV BOLUS
INTRAVENOUS | Status: AC
  Filled 2019-09-07: qty 20

## 2019-09-07 SURGICAL SUPPLY — 72 items
ADH SKN CLS APL DERMABOND .7 (GAUZE/BANDAGES/DRESSINGS) ×1
APL SRG 60D 8 XTD TIP BNDBL (TIP)
BAG DECANTER FOR FLEXI CONT (MISCELLANEOUS) ×2 IMPLANT
BASKET BONE COLLECTION (BASKET) ×2 IMPLANT
BLADE CLIPPER SURG (BLADE) IMPLANT
BONE CANC CHIPS 20CC PCAN1/4 (Bone Implant) ×2 IMPLANT
BUR MATCHSTICK NEURO 3.0 LAGG (BURR) ×2 IMPLANT
CAGE COROENT LG 10X9X23-12 (Cage) ×2 IMPLANT
CAGE COROENT PLIF 10X28-8 LUMB (Cage) ×2 IMPLANT
CANISTER SUCT 3000ML PPV (MISCELLANEOUS) ×2 IMPLANT
CHIPS CANC BONE 20CC PCAN1/4 (Bone Implant) ×1 IMPLANT
CNTNR URN SCR LID CUP LEK RST (MISCELLANEOUS) ×1 IMPLANT
CONT SPEC 4OZ STRL OR WHT (MISCELLANEOUS) ×2
COVER BACK TABLE 60X90IN (DRAPES) ×2 IMPLANT
COVER WAND RF STERILE (DRAPES) ×1 IMPLANT
DECANTER SPIKE VIAL GLASS SM (MISCELLANEOUS) ×2 IMPLANT
DERMABOND ADVANCED (GAUZE/BANDAGES/DRESSINGS) ×1
DERMABOND ADVANCED .7 DNX12 (GAUZE/BANDAGES/DRESSINGS) ×1 IMPLANT
DEVICE DISSECT PLASMABLAD 3.0S (MISCELLANEOUS) ×1 IMPLANT
DRAPE C-ARM 42X72 X-RAY (DRAPES) ×4 IMPLANT
DRAPE HALF SHEET 40X57 (DRAPES) ×1 IMPLANT
DRAPE LAPAROTOMY 100X72X124 (DRAPES) ×2 IMPLANT
DRSG OPSITE POSTOP 4X6 (GAUZE/BANDAGES/DRESSINGS) ×1 IMPLANT
DURAPREP 26ML APPLICATOR (WOUND CARE) ×2 IMPLANT
DURASEAL APPLICATOR TIP (TIP) IMPLANT
DURASEAL SPINE SEALANT 3ML (MISCELLANEOUS) IMPLANT
ELECT REM PT RETURN 9FT ADLT (ELECTROSURGICAL) ×2
ELECTRODE REM PT RTRN 9FT ADLT (ELECTROSURGICAL) ×1 IMPLANT
GAUZE 4X4 16PLY RFD (DISPOSABLE) IMPLANT
GAUZE SPONGE 4X4 12PLY STRL (GAUZE/BANDAGES/DRESSINGS) ×2 IMPLANT
GLOVE BIO SURGEON STRL SZ 6.5 (GLOVE) ×1 IMPLANT
GLOVE BIO SURGEON STRL SZ7 (GLOVE) ×2 IMPLANT
GLOVE BIOGEL PI IND STRL 7.5 (GLOVE) IMPLANT
GLOVE BIOGEL PI IND STRL 8.5 (GLOVE) ×2 IMPLANT
GLOVE BIOGEL PI INDICATOR 7.5 (GLOVE) ×1
GLOVE BIOGEL PI INDICATOR 8.5 (GLOVE) ×2
GLOVE ECLIPSE 8.5 STRL (GLOVE) ×4 IMPLANT
GOWN STRL REUS W/ TWL LRG LVL3 (GOWN DISPOSABLE) IMPLANT
GOWN STRL REUS W/ TWL XL LVL3 (GOWN DISPOSABLE) IMPLANT
GOWN STRL REUS W/TWL 2XL LVL3 (GOWN DISPOSABLE) ×4 IMPLANT
GOWN STRL REUS W/TWL LRG LVL3 (GOWN DISPOSABLE) ×4
GOWN STRL REUS W/TWL XL LVL3 (GOWN DISPOSABLE)
HEMOSTAT POWDER KIT SURGIFOAM (HEMOSTASIS) ×2 IMPLANT
KIT BASIN OR (CUSTOM PROCEDURE TRAY) ×2 IMPLANT
KIT INFUSE SMALL (Orthopedic Implant) ×1 IMPLANT
KIT TURNOVER KIT B (KITS) ×2 IMPLANT
MILL MEDIUM DISP (BLADE) ×2 IMPLANT
NDL SPNL 18GX3.5 QUINCKE PK (NEEDLE) IMPLANT
NEEDLE HYPO 22GX1.5 SAFETY (NEEDLE) ×2 IMPLANT
NEEDLE SPNL 18GX3.5 QUINCKE PK (NEEDLE) IMPLANT
NS IRRIG 1000ML POUR BTL (IV SOLUTION) ×2 IMPLANT
PACK LAMINECTOMY NEURO (CUSTOM PROCEDURE TRAY) ×2 IMPLANT
PAD ARMBOARD 7.5X6 YLW CONV (MISCELLANEOUS) ×6 IMPLANT
PATTIES SURGICAL .5 X1 (DISPOSABLE) ×3 IMPLANT
PLASMABLADE 3.0S (MISCELLANEOUS) ×2
ROD RELINE-O 5.5X100 LORD (Rod) ×2 IMPLANT
SCREW LOCK RELINE 5.5 TULIP (Screw) ×8 IMPLANT
SCREW RELINE-O POLY 6.5X45 (Screw) ×4 IMPLANT
SPONGE LAP 4X18 RFD (DISPOSABLE) ×1 IMPLANT
SPONGE SURGIFOAM ABS GEL 100 (HEMOSTASIS) ×2 IMPLANT
SUT PROLENE 6 0 BV (SUTURE) IMPLANT
SUT VIC AB 1 CT1 18XBRD ANBCTR (SUTURE) ×1 IMPLANT
SUT VIC AB 1 CT1 8-18 (SUTURE) ×2
SUT VIC AB 2-0 CP2 18 (SUTURE) ×2 IMPLANT
SUT VIC AB 3-0 SH 8-18 (SUTURE) ×2 IMPLANT
SUT VIC AB 4-0 RB1 18 (SUTURE) ×2 IMPLANT
SYR 3ML LL SCALE MARK (SYRINGE) ×9 IMPLANT
SYR 5ML LL (SYRINGE) IMPLANT
TOWEL GREEN STERILE (TOWEL DISPOSABLE) ×2 IMPLANT
TOWEL GREEN STERILE FF (TOWEL DISPOSABLE) ×2 IMPLANT
TRAY FOLEY MTR SLVR 16FR STAT (SET/KITS/TRAYS/PACK) ×2 IMPLANT
WATER STERILE IRR 1000ML POUR (IV SOLUTION) ×2 IMPLANT

## 2019-09-07 NOTE — Progress Notes (Signed)
Patient ID: [name] [name], male   DOB: Oct 06, 1940, 79 y.o.   MRN: [id number] Vital signs are stable Motor function remained stable Labs are stable Patient for surgery to decompress L3-L5 today

## 2019-09-07 NOTE — Progress Notes (Signed)
These are the patients health care proxy's. They can be told of medical details

## 2019-09-07 NOTE — Progress Notes (Signed)
PROGRESS NOTE    [name] [name]  TKZ:[id number] DOB: Dec 04, 1940 DOA: 09/05/2019 PCP: Unk Pinto, MD   Brief Narrative:  79 y/o with PMH of HTN, pre-DM, CKD stage II, obese male, L2-3 posterior lumbar fusion in 2020, left Hip OA s/p ORIF, total hip arthroplasty and revision who presented with worsening of low back pain and B/L weakness. His exam was concerning for cord impingement and he was transferred to [facility] for MRI of his thoracic and lumbar spine. He reports an increase in his lowe back pain over the last three months. Lumbar MRI revealedL3-5 spinal stenosis.   Assessment & Plan:   Active Problems:   Stricture and stenosis of esophagus   Hyperlipidemia   Hypertension   GERD (gastroesophageal reflux disease)   CKD (chronic kidney disease) stage 3, GFR 30-59 ml/min (HCC)   Spinal stenosis  Essential hypertension, improved -Continue Norvasc 5 mg daily, losartan 100 mg daily, metoprolol 12.5 mg p.o. twice daily  Diabetes mellitus type 2, hemoglobin A1c 6.6 -Continue insulin sliding scale and Accu-Chek  BPH -Flomax  CKD stage II with non-anion gap metabolic acidosis baseline creatinine around 1.4. repeat BMP  Abnormal prostatic hip, show some lucency around prosthetic hip joints Ortho consulted, vitamin D normal limits.  CRP negative.  Spinal stenosis with early signs of spinal cord impingement.   Management per primary team.  Surgical intervention later today   DVT prophylaxis: Per primary team Code Status: Full code   Body mass index is 35.36 kg/m.    Subjective: No complaints feels okay  Review of Systems Otherwise negative except as per HPI, including: General: Denies fever, chills, night sweats or unintended weight loss. Resp: Denies cough, wheezing, shortness of breath. Cardiac: Denies chest pain, palpitations, orthopnea, paroxysmal nocturnal dyspnea. GI: Denies abdominal pain, nausea, vomiting, diarrhea or constipation GU: Denies dysuria,  frequency, hesitancy or incontinence MS: Denies muscle aches, joint pain or swelling Neuro: Denies headache, neurologic deficits (focal weakness, numbness, tingling), abnormal gait Psych: Denies anxiety, depression, SI/HI/AVH Skin: Denies new rashes or lesions ID: Denies sick contacts, exotic exposures, travel  Examination:  Constitutional: Not in acute distress Respiratory: Clear to auscultation bilaterally Cardiovascular: Normal sinus rhythm, no rubs Abdomen: Nontender nondistended good bowel sounds Musculoskeletal: No edema noted Skin: No rashes seen Neurologic: CN 2-12 grossly intact.  And nonfocal Psychiatric: Normal judgment and insight. Alert and oriented x 3. Normal mood.   Objective: Vitals:   09/06/19 2017 09/06/19 2345 09/07/19 0345 09/07/19 0835  BP: 127/65 130/75 118/71 130/85  Pulse: 65 69 (!) 58 (!) 56  Resp: 19 18 18 18   Temp: (!) 97.5 F (36.4 C) 98.1 F (36.7 C) 98.1 F (36.7 C) 98.2 F (36.8 C)  TempSrc: Oral Oral Oral Oral  SpO2: 97% 99% 97% 98%  Weight:      Height:        Intake/Output Summary (Last 24 hours) at 09/07/2019 1120 Last data filed at 09/06/2019 1900 Gross per 24 hour  Intake 560 ml  Output 2 ml  Net 558 ml   Filed Weights   09/05/19 0245  Weight: 115 kg     Data Reviewed:   CBC: Recent Labs  Lab 09/05/19 0402 09/07/19 0411  WBC 7.0 8.3  NEUTROABS 4.1  --   HGB 12.2* 11.8*  HCT 39.6 37.6*  MCV 83.7 83.0  PLT 293 573   Basic Metabolic Panel: Recent Labs  Lab 09/05/19 0402 09/06/19 0448 09/07/19 0411  NA 132* 134* 136  K 5.0 4.4 3.7  CL 103 101 104  CO2 18* 21* 22  GLUCOSE 123* 152* 148*  BUN 22 18 25*  CREATININE 1.58* 1.47* 1.61*  CALCIUM 9.0 9.4 9.2  MG  --   --  1.8   GFR: Estimated Creatinine Clearance: 50.4 mL/min (A) (by C-G formula based on SCr of 1.61 mg/dL (H)). Liver Function Tests: Recent Labs  Lab 09/05/19 0402  AST 46*  ALT 23  ALKPHOS 25*  BILITOT 1.1  PROT 6.9  ALBUMIN 3.8   No  results for input(s): LIPASE, AMYLASE in the last 168 hours. No results for input(s): AMMONIA in the last 168 hours. Coagulation Profile: No results for input(s): INR, PROTIME in the last 168 hours. Cardiac Enzymes: No results for input(s): CKTOTAL, CKMB, CKMBINDEX, TROPONINI in the last 168 hours. BNP (last 3 results) No results for input(s): PROBNP in the last 8760 hours. HbA1C: Recent Labs    09/06/19 0448  HGBA1C 6.6*   CBG: Recent Labs  Lab 09/06/19 1207 09/06/19 1611 09/06/19 2121 09/07/19 0637  GLUCAP 116* 148* 153* 101*   Lipid Profile: No results for input(s): CHOL, HDL, LDLCALC, TRIG, CHOLHDL, LDLDIRECT in the last 72 hours. Thyroid Function Tests: No results for input(s): TSH, T4TOTAL, FREET4, T3FREE, THYROIDAB in the last 72 hours. Anemia Panel: No results for input(s): VITAMINB12, FOLATE, FERRITIN, TIBC, IRON, RETICCTPCT in the last 72 hours. Sepsis Labs: No results for input(s): PROCALCITON, LATICACIDVEN in the last 168 hours.  Recent Results (from the past 240 hour(s))  SARS Coronavirus 2 by RT PCR (hospital order, performed in [facility] hospital lab) Nasopharyngeal Nasopharyngeal Swab     Status: None   Collection Time: 09/05/19  4:01 AM   Specimen: Nasopharyngeal Swab  Result Value Ref Range Status   SARS Coronavirus 2 NEGATIVE NEGATIVE Final    Comment: (NOTE) SARS-CoV-2 target nucleic acids are NOT DETECTED.  The SARS-CoV-2 RNA is generally detectable in upper and lower respiratory specimens during the acute phase of infection. The lowest concentration of SARS-CoV-2 viral copies this assay can detect is 250 copies / mL. A negative result does not preclude SARS-CoV-2 infection and should not be used as the sole basis for treatment or other patient management decisions.  A negative result may occur with improper specimen collection / handling, submission of specimen other than nasopharyngeal swab, presence of viral mutation(s) within the areas  targeted by this assay, and inadequate number of viral copies (<250 copies / mL). A negative result must be combined with clinical observations, patient history, and epidemiological information.  Fact Sheet for Patients:   StrictlyIdeas.no  Fact Sheet for Healthcare Providers: BankingDealers.co.za  This test is not yet approved or  cleared by the [place] FDA and has been authorized for detection and/or diagnosis of SARS-CoV-2 by FDA under an Emergency Use Authorization (EUA).  This EUA will remain in effect (meaning this test can be used) for the duration of the COVID-19 declaration under Section 564(b)(1) of the Act, 21 U.S.C. section 360bbb-3(b)(1), unless the authorization is terminated or revoked sooner.  Performed at [facility], [place] [address], [place], [place] [address]   Surgical PCR screen     Status: Abnormal   Collection Time: 09/07/19  5:57 AM   Specimen: Nasal Mucosa; Nasal Swab  Result Value Ref Range Status   MRSA, PCR NEGATIVE NEGATIVE Final   Staphylococcus aureus POSITIVE (A) NEGATIVE Final    Comment: (NOTE) The Xpert SA Assay (FDA approved for NASAL specimens in patients 38 years of age  and older), is one component of a comprehensive surveillance program. It is not intended to diagnose infection nor to guide or monitor treatment. Performed at [facility], [place] [address], [place], [place] [address]          Radiology Studies: No results found.      Scheduled Meds: . amLODipine  5 mg Oral Daily  . Chlorhexidine Gluconate Cloth  6 each Topical Once   And  . Chlorhexidine Gluconate Cloth  6 each Topical Once  . docusate sodium  100 mg Oral BID  . fenofibrate  160 mg Oral Daily  . finasteride  5 mg Oral Daily  . insulin aspart  0-5 Units Subcutaneous QHS  . insulin aspart  0-9 Units Subcutaneous TID WC  . losartan  100 mg Oral Daily  . magnesium oxide  200 mg Oral  BID  . metoprolol tartrate  12.5 mg Oral BID  . mupirocin ointment  1 application Nasal BID  . pantoprazole  40 mg Oral Daily  . tamsulosin  0.4 mg Oral QPC breakfast   Continuous Infusions: .  ceFAZolin (ANCEF) IV    . methocarbamol (ROBAXIN) IV       LOS: 0 days   Time spent= 35 mins    [name] [name] Arsenio Loader, MD [facility]  If 7PM-7AM, please contact night-coverage  09/07/2019, 11:20 AM

## 2019-09-07 NOTE — Transfer of Care (Signed)
Immediate Anesthesia Transfer of Care Note  Patient: [name] [name]  Procedure(s) Performed: Lumbar three-four Lumbar four-five Posterior lumbar interbody fusion (N/A Back)  Patient Location: PACU  Anesthesia Type:General  Level of Consciousness: drowsy and responds to stimulation  Airway & Oxygen Therapy: Patient Spontanous Breathing and Patient connected to nasal cannula oxygen  Post-op Assessment: Report given to RN and Post -op Vital signs reviewed and stable  Post vital signs: Reviewed and stable  Last Vitals:  Vitals Value Taken Time  BP 120/71 09/07/19 2230  Temp    Pulse 87 09/07/19 2237  Resp 18 09/07/19 2237  SpO2 98 % 09/07/19 2237  Vitals shown include unvalidated device data.  Last Pain:  Vitals:   09/07/19 1615  TempSrc: Oral  PainSc:       Patients Stated Pain Goal: 0 (64/84/72 0721)  Complications: No complications documented.

## 2019-09-07 NOTE — Op Note (Signed)
Date of surgery: 09/07/2019 Preoperative diagnosis: Lumbar spondylosis and stenosis L3-4 and L4-5 history of decompression of fusion L2-3, neurogenic claudication, lumbar radiculopathy. Postoperative diagnosis: Same Procedure: Laminectomy and decompression of L3-4 and L4-5 with more work than required for simple interbody technique.  Posterior lumbar interbody fusion L3-4 and L4-5 using peek spacers local autograft allograft and infuse.  Posterior lateral arthrodesis with local autograft allograft and infuse L3-L5.  Segmental pedicle fixation from L2-L5.  Fluoroscopic visualization. Surgeon: Kristeen Miss Anesthesia: General endotracheal Indications: [name] [name] is a 79 year old individuals had significant weakness in his legs developing over the past couple of weeks time.  Pain had become so severe that he could not ambulate at all he was admitted to the hospital where an MRI demonstrated a high-grade stenosis at L3-4 and L4-5.  He was advised regarding the need for surgical decompression and stabilization.  Procedure: Patient was brought to the operating room supine on stretcher.  After the smooth induction of general tracheal anesthesia, was carefully turned prone.  The back was prepped with alcohol DuraPrep and draped in a sterile fashion.  Fluoroscopic visualization was used for pedicle screw placement but midline incision was creating carried down to the lumbodorsal fascia which was opened on either side of midline to expose old hardware.  Hardware was exposed at L2 and L3 and this was isolated for later removal.  Dissection was then continued below this to expose L3-4 and L4-5.  Laminotomies were created after an adequate exposure was obtained removing the inferior margin lamina of L3 out to and including the entirety of the facet at L3-4.  A total laminectomy of L4 was then performed removing the laminar arch of the facet joints and spinous process.  Then care was taken to expose and decompress each of  the individual nerve root starting with L3 bilaterally L4 and L5.  Once the nerve roots were isolated and decompressed the disc spaces were isolated.  Then discectomies were performed removing the entire amount of disks material in the disc space at L3-4 and L4-5.  At L3-4 then the interbody was spaced for an appropriate size spacer which in this case measured 10 x 9 x 23 mm with an 8 degree lordosis.  At L4 510 x 9 x 23 mm spacers were used with a 12 degree lordosis and this allowed packing of a total of 20 cc of bone graft into the L4-5 space and a 15 cc of bone graft into the L3-4 space.  Once the spaces were packed and care was taken to make sure that the individual nerve roots were decompressed pedicle entry sites were chosen at L4 and L5 6.5 x 45 mm screws were placed into each of these pedicles.  This was done with fluoroscopic visualization.  Then 100 mm precontoured 5.5 mm diameter rods were used to secure the screws from L2 down to L5 previously placed rods at L2-3 were removed new caps were inserted and fixation was performed in a neutral construct from L2-L5.  Final radiographs were obtained in AP and lateral projection prior to this of the lateral gutters which had been previously decorticated were packed with total volume of 20 cc of bone graft in either lateral gutter.  Once this was completed and final radiographs were obtained the retractors were removed wound was inspected to make sure that the individual nerve roots were well decompressed and then the lumbodorsal fascia was closed with #1 Vicryl interrupted fashion 2-0 Vicryl was used in subcutaneous tissues 3-0 Vicryl  subcuticularly along with some 4-0 Vicryl subcuticularly for the final closure Dermabond was placed on the skin and blood loss was estimated at 550 cc 210 cc of Cell Saver blood was returned to the patient.

## 2019-09-07 NOTE — Anesthesia Preprocedure Evaluation (Signed)
Anesthesia Evaluation  Patient identified by MRN, date of birth, ID band Patient awake    Reviewed: Allergy & Precautions, H&P , NPO status , Patient's Chart, lab work & pertinent test results  Airway Mallampati: II   Neck ROM: full    Dental   Pulmonary former smoker,    breath sounds clear to auscultation       Cardiovascular hypertension,  Rhythm:regular Rate:Normal     Neuro/Psych    GI/Hepatic GERD  ,Esophageal stricture   Endo/Other  H/o hyperparathyroidism. S/p parathyroidectomy  Renal/GU Renal InsufficiencyRenal disease     Musculoskeletal  (+) Arthritis ,   Abdominal   Peds  Hematology   Anesthesia Other Findings   Reproductive/Obstetrics                             Anesthesia Physical Anesthesia Plan  ASA: III  Anesthesia Plan: General   Post-op Pain Management:    Induction: Intravenous  PONV Risk Score and Plan: 2 and Ondansetron, Dexamethasone and Treatment may vary due to age or medical condition  Airway Management Planned: Oral ETT  Additional Equipment:   Intra-op Plan:   Post-operative Plan: Extubation in OR  Informed Consent: I have reviewed the patients History and Physical, chart, labs and discussed the procedure including the risks, benefits and alternatives for the proposed anesthesia with the patient or authorized representative who has indicated his/her understanding and acceptance.       Plan Discussed with: CRNA, Anesthesiologist and Surgeon  Anesthesia Plan Comments:         Anesthesia Quick Evaluation

## 2019-09-07 NOTE — Anesthesia Procedure Notes (Signed)
Procedure Name: Intubation Date/Time: 09/07/2019 5:36 PM Performed by: Barrington Ellison, CRNA Pre-anesthesia Checklist: Patient identified, Emergency Drugs available, Suction available and Patient being monitored Patient Re-evaluated:Patient Re-evaluated prior to induction Oxygen Delivery Method: Circle System Utilized Preoxygenation: Pre-oxygenation with 100% oxygen Induction Type: IV induction Ventilation: Mask ventilation without difficulty Laryngoscope Size: Mac and 4 Grade View: Grade I Tube type: Oral Tube size: 7.5 mm Number of attempts: 1 Airway Equipment and Method: Stylet and Oral airway Placement Confirmation: ETT inserted through vocal cords under direct vision,  positive ETCO2 and breath sounds checked- equal and bilateral Secured at: 23 cm Tube secured with: Tape Dental Injury: Teeth and Oropharynx as per pre-operative assessment

## 2019-09-08 LAB — BASIC METABOLIC PANEL
Anion gap: 10 (ref 5–15)
BUN: 28 mg/dL — ABNORMAL HIGH (ref 8–23)
CO2: 19 mmol/L — ABNORMAL LOW (ref 22–32)
Calcium: 8.2 mg/dL — ABNORMAL LOW (ref 8.9–10.3)
Chloride: 104 mmol/L (ref 98–111)
Creatinine, Ser: 2.07 mg/dL — ABNORMAL HIGH (ref 0.61–1.24)
GFR calc Af Amer: 35 mL/min — ABNORMAL LOW (ref >60)
GFR calc non Af Amer: 30 mL/min — ABNORMAL LOW (ref >60)
Glucose, Bld: 170 mg/dL — ABNORMAL HIGH (ref 70–99)
Potassium: 5.2 mmol/L — ABNORMAL HIGH (ref 3.5–5.1)
Sodium: 133 mmol/L — ABNORMAL LOW (ref 135–145)

## 2019-09-08 LAB — GLUCOSE, CAPILLARY
Glucose-Capillary: 152 mg/dL — ABNORMAL HIGH (ref 70–99)
Glucose-Capillary: 181 mg/dL — ABNORMAL HIGH (ref 70–99)
Glucose-Capillary: 132 mg/dL — ABNORMAL HIGH (ref 70–99)
Glucose-Capillary: 159 mg/dL — ABNORMAL HIGH (ref 70–99)

## 2019-09-08 LAB — CBC
HCT: 34.9 % — ABNORMAL LOW (ref 39.0–52.0)
Hemoglobin: 10.7 g/dL — ABNORMAL LOW (ref 13.0–17.0)
MCH: 26.4 pg (ref 26.0–34.0)
MCHC: 30.7 g/dL (ref 30.0–36.0)
MCV: 86 fL (ref 80.0–100.0)
Platelets: 313 10*3/uL (ref 150–400)
RBC: 4.06 MIL/uL — ABNORMAL LOW (ref 4.22–5.81)
RDW: 15.9 % — ABNORMAL HIGH (ref 11.5–15.5)
WBC: 13.8 10*3/uL — ABNORMAL HIGH (ref 4.0–10.5)
nRBC: 0 % (ref 0.0–0.2)

## 2019-09-08 LAB — MAGNESIUM: Magnesium: 1.6 mg/dL — ABNORMAL LOW (ref 1.7–2.4)

## 2019-09-08 MED ORDER — ONDANSETRON HCL 4 MG PO TABS
4.0000 mg | ORAL_TABLET | Freq: Four times a day (QID) | ORAL | Status: DC | PRN
  Administered 2019-09-17 (×2): 4 mg via ORAL
  Filled 2019-09-08 (×2): qty 1

## 2019-09-08 MED ORDER — ONDANSETRON HCL 4 MG/2ML IJ SOLN
4.0000 mg | Freq: Four times a day (QID) | INTRAMUSCULAR | Status: DC | PRN
  Administered 2019-09-16: 4 mg via INTRAVENOUS
  Filled 2019-09-08: qty 2

## 2019-09-08 MED ORDER — PHENOL 1.4 % MT LIQD: 1.0000 | OROMUCOSAL | Status: DC | PRN

## 2019-09-08 MED ORDER — MENTHOL 3 MG MT LOZG: 1.0000 | LOZENGE | OROMUCOSAL | Status: DC | PRN

## 2019-09-08 MED ORDER — SODIUM CHLORIDE 0.9% FLUSH: 3.0000 mL | INTRAVENOUS | Status: DC | PRN

## 2019-09-08 MED ORDER — BISACODYL 10 MG RE SUPP
10.0000 mg | Freq: Every day | RECTAL | Status: DC | PRN
  Administered 2019-09-13: 10 mg via RECTAL
  Filled 2019-09-08: qty 1

## 2019-09-08 MED ORDER — SODIUM CHLORIDE 0.9% FLUSH
3.0000 mL | Freq: Two times a day (BID) | INTRAVENOUS | Status: DC
  Administered 2019-09-08 – 2019-09-13 (×13): 3 mL via INTRAVENOUS

## 2019-09-08 MED ORDER — ACETAMINOPHEN 650 MG RE SUPP: 650.0000 mg | RECTAL | Status: DC | PRN

## 2019-09-08 MED ORDER — SODIUM CHLORIDE 0.9 % IV SOLN: INTRAVENOUS | Status: AC

## 2019-09-08 MED ORDER — MORPHINE SULFATE (PF) 2 MG/ML IV SOLN: 2.0000 mg | INTRAVENOUS | Status: DC | PRN

## 2019-09-08 MED ORDER — LACTATED RINGERS IV SOLN: INTRAVENOUS | Status: DC

## 2019-09-08 MED ORDER — OXYCODONE-ACETAMINOPHEN 5-325 MG PO TABS
1.0000 | ORAL_TABLET | ORAL | Status: DC | PRN
  Administered 2019-09-09 – 2019-09-15 (×24): 2 via ORAL
  Administered 2019-09-15: 1 via ORAL
  Administered 2019-09-16 – 2019-09-17 (×4): 2 via ORAL
  Administered 2019-09-18 (×2): 1 via ORAL
  Filled 2019-09-08 (×4): qty 2
  Filled 2019-09-08: qty 1
  Filled 2019-09-08 (×2): qty 2
  Filled 2019-09-08: qty 1
  Filled 2019-09-08 (×24): qty 2

## 2019-09-08 MED ORDER — TAMSULOSIN HCL 0.4 MG PO CAPS: 0.4000 mg | ORAL_CAPSULE | Freq: Every day | ORAL | Status: DC

## 2019-09-08 MED ORDER — ACETAMINOPHEN 325 MG PO TABS: 650.0000 mg | ORAL_TABLET | ORAL | Status: DC | PRN

## 2019-09-08 MED ORDER — SENNA 8.6 MG PO TABS
1.0000 | ORAL_TABLET | Freq: Two times a day (BID) | ORAL | Status: DC
  Administered 2019-09-08 – 2019-09-18 (×21): 8.6 mg via ORAL
  Filled 2019-09-08 (×21): qty 1

## 2019-09-08 MED ORDER — METHOCARBAMOL 500 MG PO TABS
500.0000 mg | ORAL_TABLET | Freq: Four times a day (QID) | ORAL | Status: DC | PRN
  Administered 2019-09-09: 500 mg via ORAL
  Filled 2019-09-08 (×3): qty 1

## 2019-09-08 MED ORDER — SODIUM CHLORIDE 0.9 % IV SOLN
250.0000 mL | INTRAVENOUS | Status: DC
  Administered 2019-09-08: 250 mL via INTRAVENOUS

## 2019-09-08 MED ORDER — ALUM & MAG HYDROXIDE-SIMETH 200-200-20 MG/5ML PO SUSP: 30.0000 mL | Freq: Four times a day (QID) | ORAL | Status: DC | PRN

## 2019-09-08 MED ORDER — METHOCARBAMOL 1000 MG/10ML IJ SOLN
500.0000 mg | Freq: Four times a day (QID) | INTRAVENOUS | Status: DC | PRN
  Administered 2019-09-08: 500 mg via INTRAVENOUS
  Filled 2019-09-08: qty 5

## 2019-09-08 MED ORDER — FINASTERIDE 5 MG PO TABS: 5.0000 mg | ORAL_TABLET | Freq: Every day | ORAL | Status: DC

## 2019-09-08 MED ORDER — POLYETHYLENE GLYCOL 3350 17 G PO PACK: 17.0000 g | PACK | Freq: Every day | ORAL | Status: DC | PRN

## 2019-09-08 MED ORDER — PANTOPRAZOLE SODIUM 40 MG PO TBEC
40.0000 mg | DELAYED_RELEASE_TABLET | Freq: Two times a day (BID) | ORAL | Status: DC
  Administered 2019-09-08 – 2019-09-18 (×22): 40 mg via ORAL
  Filled 2019-09-08 (×22): qty 1

## 2019-09-08 MED ORDER — FLEET ENEMA 7-19 GM/118ML RE ENEM: 1.0000 | ENEMA | Freq: Once | RECTAL | Status: DC | PRN

## 2019-09-08 MED ORDER — CEFAZOLIN SODIUM-DEXTROSE 2-4 GM/100ML-% IV SOLN
2.0000 g | Freq: Three times a day (TID) | INTRAVENOUS | Status: AC
  Administered 2019-09-08: 2 g via INTRAVENOUS
  Filled 2019-09-08 (×2): qty 100

## 2019-09-08 MED ORDER — DOCUSATE SODIUM 100 MG PO CAPS: 100.0000 mg | ORAL_CAPSULE | Freq: Two times a day (BID) | ORAL | Status: DC

## 2019-09-08 MED FILL — Thrombin For Soln 5000 Unit: CUTANEOUS | Qty: 5000 | Status: AC

## 2019-09-08 MED FILL — Thrombin For Soln Kit 20000 Unit: CUTANEOUS | Qty: 1 | Status: AC

## 2019-09-08 NOTE — Evaluation (Addendum)
Physical Therapy Evaluation Patient Details Name: [name] [name] MRN: [id number] DOB: 1940/04/02 Today's Date: 09/08/2019   History of Present Illness  pt is a 79 y/o male with h/o HTN, L hip ORIF, THA then revision, L23 fusion, admitted with neurogenic claudication and radiculopathy.  Pt s/p lami and decompression of L34 and L45 with PLIF, PLA, spacers and bone grafting.  Clinical Impression  Pt admitted with/for lumbar surgery.  Pt has had a slow decline in recent months and is needing min to min guard assist post surgery.  Pt currently limited functionally due to the problems listed below.  (see problems list.)  Pt will benefit from PT to maximize function and safety to be able to get home safely with available assist.     Follow Up Recommendations Home health PT;Supervision/Assistance - 24 hour    Equipment Recommendations  Other (comment);None recommended by PT (TBD)    Recommendations for Other Services       Precautions / Restrictions Precautions Precautions: Back;Fall Precaution Booklet Issued: No Required Braces or Orthoses: Spinal Brace Spinal Brace: Lumbar corset;Applied in sitting position      Mobility  Bed Mobility Overal bed mobility: Needs Assistance Bed Mobility: Rolling;Sidelying to Sit Rolling: Min guard Sidelying to sit: Min assist;Mod assist       General bed mobility comments: L shoulder weakness making up from L elbow effortful, needing min to moderate assist.  Transfers Overall transfer level: Needs assistance   Transfers: Sit to/from Stand Sit to Stand: Min guard;From elevated surface         General transfer comment: cues for had placement  Ambulation/Gait Ambulation/Gait assistance: Min guard Gait Distance (Feet): 50 Feet Assistive device: Rolling walker (2 wheeled) Gait Pattern/deviations: Step-through pattern Gait velocity: slower Gait velocity interpretation: <1.31 ft/sec, indicative of household ambulator General Gait Details:  mildly unsteady, antalgic appearing on the L LE with mild hip drop R.  Stairs            Wheelchair Mobility    Modified Rankin (Stroke Patients Only)       Balance                                             Pertinent Vitals/Pain Pain Assessment: Faces Faces Pain Scale: Hurts little more Pain Location: bottom and back Pain Descriptors / Indicators: Discomfort;Guarding Pain Intervention(s): Monitored during session    Home Living Family/patient expects to be discharged to:: Private residence Living Arrangements: Spouse/significant other Available Help at Discharge: Family;Available 24 hours/day (Wife can be a "gopher", but should not lift) Type of Home: House Home Access: Stairs to enter Entrance Stairs-Rails: Right;Left Entrance Stairs-Number of Steps: several Home Layout: One level;Other (Comment) (steps interior for sunken rooms) Home Equipment: Walker - 4 wheels;Cane - single point      Prior Function Level of Independence: Independent with assistive device(s)         Comments: has used a cane recently     Hand Dominance        Extremity/Trunk Assessment   Upper Extremity Assessment Upper Extremity Assessment: Defer to OT evaluation    Lower Extremity Assessment Lower Extremity Assessment: Overall WFL for tasks assessed;LLE deficits/detail LLE Deficits / Details: L LE with general weakness at hip and knee with mild contralateral hip drop.    Cervical / Trunk Assessment Cervical / Trunk Assessment: Other exceptions (surgical back)  Communication  Communication: No difficulties  Cognition Arousal/Alertness: Awake/alert Behavior During Therapy: WFL for tasks assessed/performed Overall Cognitive Status: Within Functional Limits for tasks assessed                                        General Comments General comments (skin integrity, edema, etc.): pt instructed in back care/prec, log roll/transition from  side, bracing issues, lifting prec, and progression of activity    Exercises     Assessment/Plan    PT Assessment Patient needs continued PT services  PT Problem List Decreased strength;Decreased activity tolerance;Decreased mobility;Decreased knowledge of use of DME;Decreased knowledge of precautions;Pain       PT Treatment Interventions Gait training;DME instruction;Stair training;Functional mobility training;Therapeutic activities;Patient/family education    PT Goals (Current goals can be found in the Care Plan section)  Acute Rehab PT Goals Patient Stated Goal: able to go on my 2 planned trips, but really just get better PT Goal Formulation: With patient Time For Goal Achievement: 09/12/19 Potential to Achieve Goals: Good    Frequency Min 5X/week   Barriers to discharge        Co-evaluation               AM-PAC PT "6 Clicks" Mobility  Outcome Measure Help needed turning from your back to your side while in a flat bed without using bedrails?: A Little Help needed moving from lying on your back to sitting on the side of a flat bed without using bedrails?: A Little Help needed moving to and from a bed to a chair (including a wheelchair)?: A Little Help needed standing up from a chair using your arms (e.g., wheelchair or bedside chair)?: A Little Help needed to walk in hospital room?: A Little Help needed climbing 3-5 steps with a railing? : A Little 6 Click Score: 18    End of Session   Activity Tolerance: Patient tolerated treatment well;Patient limited by pain Patient left: with call bell/phone within reach;Other (comment) (on toilet) Nurse Communication: Mobility status PT Visit Diagnosis: Other abnormalities of gait and mobility (R26.89);Pain Pain - Right/Left: Left Pain - part of body:  (hip and back)    Time: 5465-0354 PT Time Calculation (min) (ACUTE ONLY): 32 min   Charges:   PT Evaluation $PT Eval Moderate Complexity: 1 Mod PT Treatments $Gait  Training: 8-22 mins        09/08/2019  Ginger Carne., PT Acute Rehabilitation Services [phone]  (pager) [phone]  (office)  Tessie Fass Mychelle Kendra 09/08/2019, 9:38 AM

## 2019-09-08 NOTE — Progress Notes (Signed)
Occupational Therapy Evaluation  PTA, pt lived at home with his wife and was independent with ADL and mobility @ cane level. Pt states he had @ 4 falls the week before admission due to leg weakness. Pt currently requires Min A @ RW level for functional mobility and mod A for LB ADL. Pt feeling "woozy", however BP stable sitting 138/82; supine 141/83. (Possibly related to pain meds and not eating - nsg made aware and pt set up with tray.) Requires mod VC to safely use RW during mobility. Began education regarding back precautions and handout provided. Pt will need to use AE to reach modified independent level with ADL tasks to safely DC home with wife, who can only provide S/Set up due to having back problems herself. Will follow acutely to facilitate safe DC home.   09/08/19 0941  OT Visit Information  Last OT Received On 09/08/19  Assistance Needed +1  History of Present Illness pt is a 79 y/o male with h/o HTN, L hip ORIF, THA then revision, L23 fusion 04/2018, admitted with neurogenic claudication and radiculopathy.  Pt s/p lami and decompression of L34 and L45 with PLIF, PLA, spacers and bone grafting.  Precautions  Precautions Back;Fall  Precaution Booklet Issued Yes (comment)  Required Braces or Orthoses Spinal Brace  Spinal Brace Lumbar corset;Applied in sitting position  Home Living  Family/patient expects to be discharged to: Private residence  Living Arrangements Spouse/significant other  Available Help at Discharge Family;Available 24 hours/day (Wife can be a "gopher", but should not lift)  Type of [place] to enter  Entrance Stairs-Number of Steps several  Entrance Stairs-Rails Right;Left  Home Layout One level;Other (Comment) (steps interior for sunken rooms)  Bathroom Shower/Tub Walk-in shower  Bathroom Toilet Handicapped height  Bathroom Accessibility Yes  How Accessible Accessible via walker  [place] - 4 wheels;Cane - single  point;BSC;Walker - 2 wheels;Grab bars - tub/shower  Prior Function  Level of Independence Independent with assistive device(s)  Comments has used a cane recently  Communication  Communication No difficulties  Pain Assessment  Pain Assessment 0-10  Pain Score 3  Pain Location bottom and back  Pain Descriptors / Indicators Discomfort;Guarding;Aching  Pain Intervention(s) Limited activity within patient's tolerance;Repositioned  Cognition  Arousal/Alertness Awake/alert  Behavior During Therapy WFL for tasks assessed/performed  Overall Cognitive Status Within Functional Limits for tasks assessed  Upper Extremity Assessment  Upper Extremity Assessment LUE deficits/detail (hx of L shoulder weakness, pt compensates)  Lower Extremity Assessment  Lower Extremity Assessment Overall WFL for tasks assessed  LLE Deficits / Details L LE with general weakness at hip and knee with mild contralateral hip drop.  Cervical / Trunk Assessment  Cervical / Trunk Assessment Other exceptions (surgical back)  Cervical / Trunk Exceptions back sx  ADL  Overall ADL's  Needs assistance/impaired  Grooming Set up;Sitting  Upper Body Bathing Set up;Sitting  Lower Body Bathing Moderate assistance;Sit to/from stand  Upper Body Dressing  Minimal assistance  Upper Body Dressing Details (indicate cue type and reason) A needed for brace  Lower Body Dressing Moderate assistance;Sit to/from Retail buyer Minimal assistance;Ambulation;Grab bars;RW  Toileting- Clothing Manipulation and Hygiene Moderate assistance;Sitting/lateral lean;Sit to/from stand  Toileting - Clothing Manipulation Details (indicate cue type and reason) unable to reach periarea without twisting  Functional mobility during ADLs Minimal assistance;Rolling walker;Cueing for sequencing;Cueing for safety  General ADL Comments Unable to complete figure four positioing; will need AE to complete LB ADL; increased difficulty  with LLE/L hip; Began  education regarding back precautions; educated on proper wearing time for back brace per orders; Educated regarding need to increase mobility  Vision- History  Baseline Vision/History No visual deficits  Bed Mobility  Overal bed mobility Needs Assistance  Bed Mobility Rolling;Sit to Sidelying  Rolling Min guard  Sit to sidelying Min assist  General bed mobility comments L shoulder weakness making up from L elbow effortful, needing min to moderate assist.  Transfers  Overall transfer level Needs assistance  Transfers Sit to/from Stand  Sit to Stand Min guard;From elevated surface  General transfer comment cues for had placement  Balance  Overall balance assessment Needs assistance;History of Falls  Sitting balance-Leahy Scale Good  Standing balance-Leahy Scale Poor  General Comments  General comments (skin integrity, edema, etc.) Hx of 4 falls/legs giving away PTA  OT - End of Session  Equipment Utilized During Treatment Gait belt;Rolling walker;Back brace  Activity Tolerance Patient tolerated treatment well  Patient left in bed;with call bell/phone within reach;with bed alarm set  Nurse Communication Mobility status;Other (comment) (need to increase mobility)  OT Assessment  OT Recommendation/Assessment Patient needs continued OT Services  OT Visit Diagnosis Unsteadiness on feet (R26.81);Other abnormalities of gait and mobility (R26.89);History of falling (Z91.81);Muscle weakness (generalized) (M62.81);Pain  Pain - part of body  (back)  OT Problem List Decreased strength;Decreased range of motion;Decreased activity tolerance;Impaired balance (sitting and/or standing);Decreased safety awareness;Decreased knowledge of use of DME or AE;Decreased knowledge of precautions;Obesity;Pain  OT Plan  OT Frequency (ACUTE ONLY) Min 2X/week  OT Treatment/Interventions (ACUTE ONLY) Self-care/ADL training;Therapeutic exercise;DME and/or AE instruction;Therapeutic activities;Patient/family education   AM-PAC OT "6 Clicks" Daily Activity Outcome Measure (Version 2)  Help from another person eating meals? 4  Help from another person taking care of personal grooming? 3  Help from another person toileting, which includes using toliet, bedpan, or urinal? 2  Help from another person bathing (including washing, rinsing, drying)? 2  Help from another person to put on and taking off regular upper body clothing? 3  Help from another person to put on and taking off regular lower body clothing? 2  6 Click Score 16  OT Recommendation  Follow Up Recommendations Supervision - Intermittent;Home health OT (pending progress)  OT Equipment None recommended by OT  Individuals Consulted  Consulted and Agree with Results and Recommendations Patient  Acute Rehab OT Goals  Patient Stated Goal able to go on my 2 planned trips, but really just get better  OT Goal Formulation With patient  Time For Goal Achievement 09/22/19  Potential to Achieve Goals Good  OT Time Calculation  OT Start Time (ACUTE ONLY) 0911  OT Stop Time (ACUTE ONLY) 0934  OT Time Calculation (min) 23 min  OT General Charges  $OT Visit 1 Visit  OT Evaluation  $OT Eval Moderate Complexity 1 Mod  OT Treatments  $Self Care/Home Management  8-22 mins  Written Expression  Dominant Hand Right  Maurie Boettcher, OT/L   Acute OT Clinical Specialist [place] Pager [phone] Office [phone]

## 2019-09-08 NOTE — Plan of Care (Signed)
  Problem: Clinical Measurements: Goal: Ability to maintain clinical measurements within normal limits will improve Outcome: Progressing Goal: Will remain free from infection Outcome: Progressing Goal: Respiratory complications will improve Outcome: Progressing Goal: Cardiovascular complication will be avoided Outcome: Progressing   Problem: Activity: Goal: Risk for activity intolerance will decrease Outcome: Progressing   

## 2019-09-08 NOTE — Progress Notes (Signed)
PROGRESS NOTE    [name] [name]  QAS:[id number] DOB: 1940/08/27 DOA: 09/05/2019 PCP: Unk Pinto, MD   Brief Narrative:  79 y/o with PMH of HTN, pre-DM, CKD stage II, obese male, L2-3 posterior lumbar fusion in 2020, left Hip OA s/p ORIF, total hip arthroplasty and revision who presented with worsening of low back pain and B/L weakness. His exam was concerning for cord impingement and he was transferred to [facility] for MRI of his thoracic and lumbar spine. He reports an increase in his lowe back pain over the last three months. Lumbar MRI revealedL3-5 spinal stenosis.  Status post laminectomy/decompression L3-L5, fusion on 7/8.   Assessment & Plan:   Active Problems:   Stricture and stenosis of esophagus   Hyperlipidemia   Hypertension   GERD (gastroesophageal reflux disease)   CKD (chronic kidney disease) stage 3, GFR 30-59 ml/min (HCC)   Spinal stenosis  Essential hypertension, improved -Continue Norvasc 5 mg daily, losartan 100 mg daily, metoprolol 12.5 mg p.o. twice daily  Diabetes mellitus type 2, hemoglobin A1c 6.6 -Continue insulin sliding scale and Accu-Chek  BPH -Flomax  CKD stage II with non-anion gap metabolic acidosis baseline creatinine around 1.4. repeat BMP  Abnormal prostatic hip, show some lucency around prosthetic hip joints Ortho consulted, vitamin D normal limits.  CRP negative.  Spinal stenosis with early signs of spinal cord impingement.   Status post laminectomy/decompression of lumbar spine with fusion on 7/8 Management per primary team.  Incentive spirometer and flutter valve Pain control with bowel regimen   DVT prophylaxis: Per primary team Code Status: Full code   Body mass index is 35.38 kg/m.    Subjective: Some lower back discomfort but no other complaints  Review of Systems Otherwise negative except as per HPI, including: General: Denies fever, chills, night sweats or unintended weight loss. Resp: Denies cough, wheezing,  shortness of breath. Cardiac: Denies chest pain, palpitations, orthopnea, paroxysmal nocturnal dyspnea. GI: Denies abdominal pain, nausea, vomiting, diarrhea or constipation GU: Denies dysuria, frequency, hesitancy or incontinence MS: Denies muscle aches, joint pain or swelling Neuro: Denies headache, neurologic deficits (focal weakness, numbness, tingling), abnormal gait Psych: Denies anxiety, depression, SI/HI/AVH Skin: Denies new rashes or lesions ID: Denies sick contacts, exotic exposures, travel  Examination:  Constitutional: Not in acute distress Respiratory: Clear to auscultation bilaterally Cardiovascular: Normal sinus rhythm, no rubs Abdomen: Nontender nondistended good bowel sounds Musculoskeletal: No edema noted Skin: No rashes seen Neurologic: CN 2-12 grossly intact.  And nonfocal Psychiatric: Normal judgment and insight. Alert and oriented x 3. Normal mood.   Objective: Vitals:   09/07/19 2325 09/07/19 2341 09/08/19 0313 09/08/19 1000  BP:  119/80 105/62 116/72  Pulse: 70 (!) 52 77 89  Resp: 10 19 17 16   Temp: 98.2 F (36.8 C) 99.5 F (37.5 C) 97.7 F (36.5 C) 97.6 F (36.4 C)  TempSrc:  Oral Oral Oral  SpO2: 95% 100% 95% 100%  Weight:      Height:        Intake/Output Summary (Last 24 hours) at 09/08/2019 1137 Last data filed at 09/08/2019 0707 Gross per 24 hour  Intake 2466.33 ml  Output 1345 ml  Net 1121.33 ml   Filed Weights   09/05/19 0245 09/07/19 1644  Weight: 115 kg 115 kg     Data Reviewed:   CBC: Recent Labs  Lab 09/05/19 0402 09/07/19 0411 09/08/19 0505  WBC 7.0 8.3 13.8*  NEUTROABS 4.1  --   --   HGB 12.2* 11.8* 10.7*  HCT 39.6 37.6* 34.9*  MCV 83.7 83.0 86.0  PLT 293 324 427   Basic Metabolic Panel: Recent Labs  Lab 09/05/19 0402 09/06/19 0448 09/07/19 0411 09/08/19 0505  NA 132* 134* 136 133*  K 5.0 4.4 3.7 5.2*  CL 103 101 104 104  CO2 18* 21* 22 19*  GLUCOSE 123* 152* 148* 170*  BUN 22 18 25* 28*  CREATININE  1.58* 1.47* 1.61* 2.07*  CALCIUM 9.0 9.4 9.2 8.2*  MG  --   --  1.8 1.6*   GFR: Estimated Creatinine Clearance: 39.2 mL/min (A) (by C-G formula based on SCr of 2.07 mg/dL (H)). Liver Function Tests: Recent Labs  Lab 09/05/19 0402  AST 46*  ALT 23  ALKPHOS 25*  BILITOT 1.1  PROT 6.9  ALBUMIN 3.8   No results for input(s): LIPASE, AMYLASE in the last 168 hours. No results for input(s): AMMONIA in the last 168 hours. Coagulation Profile: No results for input(s): INR, PROTIME in the last 168 hours. Cardiac Enzymes: No results for input(s): CKTOTAL, CKMB, CKMBINDEX, TROPONINI in the last 168 hours. BNP (last 3 results) No results for input(s): PROBNP in the last 8760 hours. HbA1C: Recent Labs    09/06/19 0448  HGBA1C 6.6*   CBG: Recent Labs  Lab 09/07/19 1613 09/07/19 1928 09/07/19 2145 09/07/19 2233 09/08/19 0826  GLUCAP 99 123* 142* 165* 152*   Lipid Profile: No results for input(s): CHOL, HDL, LDLCALC, TRIG, CHOLHDL, LDLDIRECT in the last 72 hours. Thyroid Function Tests: No results for input(s): TSH, T4TOTAL, FREET4, T3FREE, THYROIDAB in the last 72 hours. Anemia Panel: No results for input(s): VITAMINB12, FOLATE, FERRITIN, TIBC, IRON, RETICCTPCT in the last 72 hours. Sepsis Labs: No results for input(s): PROCALCITON, LATICACIDVEN in the last 168 hours.  Recent Results (from the past 240 hour(s))  SARS Coronavirus 2 by RT PCR (hospital order, performed in [facility] hospital lab) Nasopharyngeal Nasopharyngeal Swab     Status: None   Collection Time: 09/05/19  4:01 AM   Specimen: Nasopharyngeal Swab  Result Value Ref Range Status   SARS Coronavirus 2 NEGATIVE NEGATIVE Final    Comment: (NOTE) SARS-CoV-2 target nucleic acids are NOT DETECTED.  The SARS-CoV-2 RNA is generally detectable in upper and lower respiratory specimens during the acute phase of infection. The lowest concentration of SARS-CoV-2 viral copies this assay can detect is 250 copies / mL. A  negative result does not preclude SARS-CoV-2 infection and should not be used as the sole basis for treatment or other patient management decisions.  A negative result may occur with improper specimen collection / handling, submission of specimen other than nasopharyngeal swab, presence of viral mutation(s) within the areas targeted by this assay, and inadequate number of viral copies (<250 copies / mL). A negative result must be combined with clinical observations, patient history, and epidemiological information.  Fact Sheet for Patients:   StrictlyIdeas.no  Fact Sheet for Healthcare Providers: BankingDealers.co.za  This test is not yet approved or  cleared by the [place] FDA and has been authorized for detection and/or diagnosis of SARS-CoV-2 by FDA under an Emergency Use Authorization (EUA).  This EUA will remain in effect (meaning this test can be used) for the duration of the COVID-19 declaration under Section 564(b)(1) of the Act, 21 U.S.C. section 360bbb-3(b)(1), unless the authorization is terminated or revoked sooner.  Performed at [facility], [place] [address] [place], [place] [address]   Surgical PCR screen     Status: Abnormal  Collection Time: 09/07/19  5:57 AM   Specimen: Nasal Mucosa; Nasal Swab  Result Value Ref Range Status   MRSA, PCR NEGATIVE NEGATIVE Final   Staphylococcus aureus POSITIVE (A) NEGATIVE Final    Comment: (NOTE) The Xpert SA Assay (FDA approved for NASAL specimens in patients 65 years of age and older), is one component of a comprehensive surveillance program. It is not intended to diagnose infection nor to guide or monitor treatment. Performed at [facility], [place] [address] [place], [place] [address]          Radiology Studies: DG Lumbar Spine 2-3 Views  Result Date: 09/07/2019 CLINICAL DATA:  L3-L5 PLIF. EXAM: LUMBAR SPINE - 2-3 VIEW; DG C-ARM 1-60 MIN  COMPARISON:  Per operative imaging. FINDINGS: Five fluoroscopic spot views obtained in the operating room in frontal and lateral projections. Placement of intrapedicular screws and posterior rods at L4-L5 extending from L2-L3. interbody spacers at L2-L3, L3-L4, and L4-L5. Total fluoroscopy time 23.2 seconds. IMPRESSION: Intraoperative fluoroscopy during lumbar fusion. Electronically Signed   By: Keith Rake M.D.   On: 09/07/2019 22:22   DG C-Arm 1-60 Min  Result Date: 09/07/2019 CLINICAL DATA:  L3-L5 PLIF. EXAM: LUMBAR SPINE - 2-3 VIEW; DG C-ARM 1-60 MIN COMPARISON:  Per operative imaging. FINDINGS: Five fluoroscopic spot views obtained in the operating room in frontal and lateral projections. Placement of intrapedicular screws and posterior rods at L4-L5 extending from L2-L3. interbody spacers at L2-L3, L3-L4, and L4-L5. Total fluoroscopy time 23.2 seconds. IMPRESSION: Intraoperative fluoroscopy during lumbar fusion. Electronically Signed   By: Keith Rake M.D.   On: 09/07/2019 22:22        Scheduled Meds: . amLODipine  5 mg Oral Daily  . docusate sodium  100 mg Oral BID  . fenofibrate  160 mg Oral Daily  . finasteride  5 mg Oral Daily  . insulin aspart  0-5 Units Subcutaneous QHS  . insulin aspart  0-9 Units Subcutaneous TID WC  . losartan  100 mg Oral Daily  . magnesium oxide  200 mg Oral BID  . metoprolol tartrate  12.5 mg Oral BID  . mupirocin ointment  1 application Nasal BID  . pantoprazole  40 mg Oral BID  . senna  1 tablet Oral BID  . sodium chloride flush  3 mL Intravenous Q12H  . tamsulosin  0.4 mg Oral QPC breakfast   Continuous Infusions: . sodium chloride 250 mL (09/08/19 0103)  . sodium chloride 75 mL/hr at 09/08/19 1008  . lactated ringers 125 mL/hr at 09/08/19 0210  . methocarbamol (ROBAXIN) IV 500 mg (09/08/19 0205)     LOS: 1 day   Time spent= 35 mins    Farrell Pantaleo Arsenio Loader, MD [facility]  If 7PM-7AM, please contact  night-coverage  09/08/2019, 11:37 AM

## 2019-09-08 NOTE — TOC Initial Note (Signed)
Transition of Care [facility]) - Initial/Assessment Note    Patient Details  Name: [name] [name] MRN: [id number] Date of Birth: 02/18/1941  Transition [facility]) CM/SW Contact:    Pollie Friar, RN Phone Number: 09/08/2019, 4:14 PM  Clinical Narrative:                 Pt is s/p lumbar surgery. He lives home with spouse that can provide supervision but not physical assist.  Pt has all needed DME at home.  Denies transportation or home medication issues.  TOC following for d/c needs.   Expected Discharge Plan: [place] Barriers to Discharge: Continued Medical Work up   Patient Goals and CMS Choice   CMS Medicare.gov Compare Post Acute Care list provided to:: Patient Choice offered to / list presented to : Patient  Expected Discharge Plan and Services Expected Discharge Plan: [place] Acute Care Choice: [place] arrangements for the past 2 months: Single Family Home                                      Prior Living Arrangements/Services Living arrangements for the past 2 months: Single Family Home Lives with:: Spouse Patient language and need for interpreter reviewed:: Yes Do you feel safe going back to the place where you live?: Yes      Need for Family Participation in Patient Care: Yes (Comment) Care giver support system in place?: Yes (comment) Current home services: DME (shower seat/ raised toilets/ walker and cane) Criminal Activity/Legal Involvement Pertinent to Current Situation/Hospitalization: No - Comment as needed  Activities of Daily Living Home Assistive Devices/Equipment: None ADL Screening (condition at time of admission) Patient's cognitive ability adequate to safely complete daily activities?: No Is the patient deaf or have difficulty hearing?: No Does the patient have difficulty seeing, even when wearing glasses/contacts?: No Does the patient have difficulty concentrating, remembering, or  making decisions?: No Patient able to express need for assistance with ADLs?: Yes Does the patient have difficulty dressing or bathing?: No Independently performs ADLs?: Yes (appropriate for developmental age) Does the patient have difficulty walking or climbing stairs?: Yes Weakness of Legs: Both Weakness of Arms/Hands: None  Permission Sought/Granted                  Emotional Assessment Appearance:: Appears stated age Attitude/Demeanor/Rapport: Engaged Affect (typically observed): Accepting Orientation: : Oriented to Self, Oriented to Place, Oriented to  Time, Oriented to Situation   Psych Involvement: No (comment)  Admission diagnosis:  Spinal stenosis [M48.00] Patient Active Problem List   Diagnosis Date Noted  . Spinal stenosis 09/05/2019  . Abnormal glucose 08/11/2019  . Herniated nucleus pulposus, L2-3 left 11/09/2017  . Normocytic anemia 05/02/2017  . Vitamin B12 deficiency 05/02/2017  . OA (osteoarthritis) of hip 06/24/2016  . CKD (chronic kidney disease) stage 3, GFR 30-59 ml/min (HCC) 03/25/2015  . Snoring 03/25/2015  . Encounter for Medicare annual wellness exam 11/27/2014  . Hyperparathyroidism, primary ([place]) 07/09/2014  . Vitamin D deficiency 06/13/2013  . Medication management 06/13/2013  . Hyperlipidemia   . Hypertension   . GERD (gastroesophageal reflux disease)   . Kidney stones   . Morbidly obese ([place])   . Stricture and stenosis of esophagus 10/18/2008   PCP:  Unk Pinto, MD Pharmacy:   [facility] [place]  [place] [address] Phone: [phone] Fax: [phone]     Social Determinants of Health (SDOH) Interventions    Readmission Risk Interventions No flowsheet data found.

## 2019-09-08 NOTE — Anesthesia Postprocedure Evaluation (Signed)
Anesthesia Post Note  Patient: [name] [name]  Procedure(s) Performed: Lumbar three-four Lumbar four-five Posterior lumbar interbody fusion (N/A Back)     Patient location during evaluation: PACU Anesthesia Type: General Level of consciousness: awake Pain management: pain level controlled Vital Signs Assessment: post-procedure vital signs reviewed and stable Respiratory status: spontaneous breathing Cardiovascular status: stable Postop Assessment: no apparent nausea or vomiting Anesthetic complications: no   No complications documented.  Last Vitals:  Vitals:   09/07/19 2325 09/07/19 2341  BP:  119/80  Pulse: 70 (!) 52  Resp: 10 19  Temp: 36.8 C 37.5 C  SpO2: 95% 100%    Last Pain:  Vitals:   09/07/19 2341  TempSrc: Oral  PainSc:                  Vienne Corcoran

## 2019-09-09 DIAGNOSIS — N179 Acute kidney failure, unspecified: Secondary | ICD-10-CM

## 2019-09-09 LAB — CBC
HCT: 32.7 % — ABNORMAL LOW (ref 39.0–52.0)
Hemoglobin: 10 g/dL — ABNORMAL LOW (ref 13.0–17.0)
MCH: 26.2 pg (ref 26.0–34.0)
MCHC: 30.6 g/dL (ref 30.0–36.0)
MCV: 85.8 fL (ref 80.0–100.0)
Platelets: 245 10*3/uL (ref 150–400)
RBC: 3.81 MIL/uL — ABNORMAL LOW (ref 4.22–5.81)
RDW: 15.9 % — ABNORMAL HIGH (ref 11.5–15.5)
WBC: 10.1 10*3/uL (ref 4.0–10.5)
nRBC: 0 % (ref 0.0–0.2)

## 2019-09-09 LAB — BASIC METABOLIC PANEL
Anion gap: 10 (ref 5–15)
BUN: 27 mg/dL — ABNORMAL HIGH (ref 8–23)
CO2: 21 mmol/L — ABNORMAL LOW (ref 22–32)
Calcium: 8.3 mg/dL — ABNORMAL LOW (ref 8.9–10.3)
Chloride: 105 mmol/L (ref 98–111)
Creatinine, Ser: 1.63 mg/dL — ABNORMAL HIGH (ref 0.61–1.24)
GFR calc Af Amer: 47 mL/min — ABNORMAL LOW (ref >60)
GFR calc non Af Amer: 40 mL/min — ABNORMAL LOW (ref >60)
Glucose, Bld: 138 mg/dL — ABNORMAL HIGH (ref 70–99)
Potassium: 4.4 mmol/L (ref 3.5–5.1)
Sodium: 136 mmol/L (ref 135–145)

## 2019-09-09 LAB — GLUCOSE, CAPILLARY
Glucose-Capillary: 131 mg/dL — ABNORMAL HIGH (ref 70–99)
Glucose-Capillary: 144 mg/dL — ABNORMAL HIGH (ref 70–99)
Glucose-Capillary: 127 mg/dL — ABNORMAL HIGH (ref 70–99)
Glucose-Capillary: 157 mg/dL — ABNORMAL HIGH (ref 70–99)
Glucose-Capillary: 176 mg/dL — ABNORMAL HIGH (ref 70–99)

## 2019-09-09 LAB — MAGNESIUM: Magnesium: 1.6 mg/dL — ABNORMAL LOW (ref 1.7–2.4)

## 2019-09-09 MED ORDER — MAGNESIUM SULFATE 4 GM/100ML IV SOLN
4.0000 g | Freq: Once | INTRAVENOUS | Status: AC
  Administered 2019-09-09: 4 g via INTRAVENOUS
  Filled 2019-09-09: qty 100

## 2019-09-09 NOTE — Progress Notes (Signed)
PROGRESS NOTE    [name] [name]  UTM:[id number] DOB: 09-05-1940 DOA: 09/05/2019 PCP: Unk Pinto, MD   Brief Narrative:  79 y/o with PMH of HTN, pre-DM, CKD stage II, obese male, L2-3 posterior lumbar fusion in 2020, left Hip OA s/p ORIF, total hip arthroplasty and revision who presented with worsening of low back pain and B/L weakness. His exam was concerning for cord impingement and he was transferred to [facility] for MRI of his thoracic and lumbar spine. He reports an increase in his lowe back pain over the last three months. Lumbar MRI revealed L3-5 spinal stenosis.  Status post laminectomy/decompression L3-L5, fusion on 7/8.   Assessment & Plan:   Active Problems:   Stricture and stenosis of esophagus   Hyperlipidemia   Hypertension   GERD (gastroesophageal reflux disease)   CKD (chronic kidney disease) stage 3, GFR 30-59 ml/min (HCC)   Spinal stenosis  Acute kidney injury on CKD stage II -Baseline creatinine 1.4.  Peaked at 2.1, improved to 1.6 with IV fluids.  Essential hypertension, improved -Continue Norvasc 5 mg daily, losartan 100 mg daily, metoprolol 12.5 mg p.o. twice daily  Diabetes mellitus type 2, hemoglobin A1c 6.6 -Continue insulin sliding scale and Accu-Chek  Hypomagnesemia -Replete electrolytes  BPH -Flomax  Abnormal prostatic hip, show some lucency around prosthetic hip joints Ortho consulted, vitamin D normal limits.  CRP negative.  Spinal stenosis with early signs of spinal cord impingement.   Status post laminectomy/decompression of lumbar spine with fusion on 7/8 Management per primary team.  Incentive spirometer and flutter valve Pain control with bowel regimen    DVT prophylaxis: Per primary team Code Status: Full code   Body mass index is 35.38 kg/m.    Subjective: No new complaints besides slight pain at the surgical site.  Review of Systems Otherwise negative except as per HPI, including: General: Denies fever, chills, night  sweats or unintended weight loss. Resp: Denies cough, wheezing, shortness of breath. Cardiac: Denies chest pain, palpitations, orthopnea, paroxysmal nocturnal dyspnea. GI: Denies abdominal pain, nausea, vomiting, diarrhea or constipation GU: Denies dysuria, frequency, hesitancy or incontinence MS: Denies muscle aches, joint pain or swelling Neuro: Denies headache, neurologic deficits (focal weakness, numbness, tingling), abnormal gait Psych: Denies anxiety, depression, SI/HI/AVH Skin: Denies new rashes or lesions ID: Denies sick contacts, exotic exposures, travel  Examination:  Constitutional: Not in acute distress Respiratory: Clear to auscultation bilaterally Cardiovascular: Normal sinus rhythm, no rubs Abdomen: Nontender nondistended good bowel sounds Musculoskeletal: No edema noted Skin: No rashes seen Neurologic: CN 2-12 grossly intact.  And nonfocal Psychiatric: Normal judgment and insight. Alert and oriented x 3. Normal mood.   Objective: Vitals:   09/08/19 1932 09/08/19 2310 09/09/19 0328 09/09/19 0823  BP: 109/66 113/62 127/72 104/69  Pulse: 89 77 85 81  Resp: 17 16 17  (!) 24  Temp: 97.8 F (36.6 C) 99 F (37.2 C) 99.1 F (37.3 C) 98.4 F (36.9 C)  TempSrc: Oral Oral Oral Oral  SpO2: 98% 100% 97% 97%  Weight:      Height:        Intake/Output Summary (Last 24 hours) at 09/09/2019 1014 Last data filed at 09/09/2019 0920 Gross per 24 hour  Intake 1781.95 ml  Output --  Net 1781.95 ml   Filed Weights   09/05/19 0245 09/07/19 1644  Weight: 115 kg 115 kg     Data Reviewed:   CBC: Recent Labs  Lab 09/05/19 0402 09/07/19 0411 09/08/19 0505 09/09/19 0429  WBC 7.0 8.3  13.8* 10.1  NEUTROABS 4.1  --   --   --   HGB 12.2* 11.8* 10.7* 10.0*  HCT 39.6 37.6* 34.9* 32.7*  MCV 83.7 83.0 86.0 85.8  PLT 293 324 313 751   Basic Metabolic Panel: Recent Labs  Lab 09/05/19 0402 09/06/19 0448 09/07/19 0411 09/08/19 0505 09/09/19 0429  NA 132* 134* 136 133*  136  K 5.0 4.4 3.7 5.2* 4.4  CL 103 101 104 104 105  CO2 18* 21* 22 19* 21*  GLUCOSE 123* 152* 148* 170* 138*  BUN 22 18 25* 28* 27*  CREATININE 1.58* 1.47* 1.61* 2.07* 1.63*  CALCIUM 9.0 9.4 9.2 8.2* 8.3*  MG  --   --  1.8 1.6* 1.6*   GFR: Estimated Creatinine Clearance: 49.7 mL/min (A) (by C-G formula based on SCr of 1.63 mg/dL (H)). Liver Function Tests: Recent Labs  Lab 09/05/19 0402  AST 46*  ALT 23  ALKPHOS 25*  BILITOT 1.1  PROT 6.9  ALBUMIN 3.8   No results for input(s): LIPASE, AMYLASE in the last 168 hours. No results for input(s): AMMONIA in the last 168 hours. Coagulation Profile: No results for input(s): INR, PROTIME in the last 168 hours. Cardiac Enzymes: No results for input(s): CKTOTAL, CKMB, CKMBINDEX, TROPONINI in the last 168 hours. BNP (last 3 results) No results for input(s): PROBNP in the last 8760 hours. HbA1C: No results for input(s): HGBA1C in the last 72 hours.  CBG: Recent Labs  Lab 09/08/19 1206 09/08/19 1525 09/08/19 2138 09/09/19 0611 09/09/19 0752  GLUCAP 181* 132* 159* 131* 144*   Lipid Profile: No results for input(s): CHOL, HDL, LDLCALC, TRIG, CHOLHDL, LDLDIRECT in the last 72 hours. Thyroid Function Tests: No results for input(s): TSH, T4TOTAL, FREET4, T3FREE, THYROIDAB in the last 72 hours. Anemia Panel: No results for input(s): VITAMINB12, FOLATE, FERRITIN, TIBC, IRON, RETICCTPCT in the last 72 hours. Sepsis Labs: No results for input(s): PROCALCITON, LATICACIDVEN in the last 168 hours.  Recent Results (from the past 240 hour(s))  SARS Coronavirus 2 by RT PCR (hospital order, performed in [facility] hospital lab) Nasopharyngeal Nasopharyngeal Swab     Status: None   Collection Time: 09/05/19  4:01 AM   Specimen: Nasopharyngeal Swab  Result Value Ref Range Status   SARS Coronavirus 2 NEGATIVE NEGATIVE Final    Comment: (NOTE) SARS-CoV-2 target nucleic acids are NOT DETECTED.  The SARS-CoV-2 RNA is generally detectable  in upper and lower respiratory specimens during the acute phase of infection. The lowest concentration of SARS-CoV-2 viral copies this assay can detect is 250 copies / mL. A negative result does not preclude SARS-CoV-2 infection and should not be used as the sole basis for treatment or other patient management decisions.  A negative result may occur with improper specimen collection / handling, submission of specimen other than nasopharyngeal swab, presence of viral mutation(s) within the areas targeted by this assay, and inadequate number of viral copies (<250 copies / mL). A negative result must be combined with clinical observations, patient history, and epidemiological information.  Fact Sheet for Patients:   StrictlyIdeas.no  Fact Sheet for Healthcare Providers: BankingDealers.co.za  This test is not yet approved or  cleared by the [place] FDA and has been authorized for detection and/or diagnosis of SARS-CoV-2 by FDA under an Emergency Use Authorization (EUA).  This EUA will remain in effect (meaning this test can be used) for the duration of the COVID-19 declaration under Section 564(b)(1) of the Act, 21 U.S.C. section 360bbb-3(b)(1), unless  the authorization is terminated or revoked sooner.  Performed at [facility], [place] [address], [place], [place] [address]   Surgical PCR screen     Status: Abnormal   Collection Time: 09/07/19  5:57 AM   Specimen: Nasal Mucosa; Nasal Swab  Result Value Ref Range Status   MRSA, PCR NEGATIVE NEGATIVE Final   Staphylococcus aureus POSITIVE (A) NEGATIVE Final    Comment: (NOTE) The Xpert SA Assay (FDA approved for NASAL specimens in patients 21 years of age and older), is one component of a comprehensive surveillance program. It is not intended to diagnose infection nor to guide or monitor treatment. Performed at [facility], [place] [address] [place],  [place] [address]          Radiology Studies: DG Lumbar Spine 2-3 Views  Result Date: 09/07/2019 CLINICAL DATA:  L3-L5 PLIF. EXAM: LUMBAR SPINE - 2-3 VIEW; DG C-ARM 1-60 MIN COMPARISON:  Per operative imaging. FINDINGS: Five fluoroscopic spot views obtained in the operating room in frontal and lateral projections. Placement of intrapedicular screws and posterior rods at L4-L5 extending from L2-L3. interbody spacers at L2-L3, L3-L4, and L4-L5. Total fluoroscopy time 23.2 seconds. IMPRESSION: Intraoperative fluoroscopy during lumbar fusion. Electronically Signed   By: Keith Rake M.D.   On: 09/07/2019 22:22   DG C-Arm 1-60 Min  Result Date: 09/07/2019 CLINICAL DATA:  L3-L5 PLIF. EXAM: LUMBAR SPINE - 2-3 VIEW; DG C-ARM 1-60 MIN COMPARISON:  Per operative imaging. FINDINGS: Five fluoroscopic spot views obtained in the operating room in frontal and lateral projections. Placement of intrapedicular screws and posterior rods at L4-L5 extending from L2-L3. interbody spacers at L2-L3, L3-L4, and L4-L5. Total fluoroscopy time 23.2 seconds. IMPRESSION: Intraoperative fluoroscopy during lumbar fusion. Electronically Signed   By: Keith Rake M.D.   On: 09/07/2019 22:22         Scheduled Meds:  amLODipine  5 mg Oral Daily   docusate sodium  100 mg Oral BID   fenofibrate  160 mg Oral Daily   finasteride  5 mg Oral Daily   insulin aspart  0-5 Units Subcutaneous QHS   insulin aspart  0-9 Units Subcutaneous TID WC   losartan  100 mg Oral Daily   magnesium oxide  200 mg Oral BID   metoprolol tartrate  12.5 mg Oral BID   mupirocin ointment  1 application Nasal BID   pantoprazole  40 mg Oral BID   senna  1 tablet Oral BID   sodium chloride flush  3 mL Intravenous Q12H   tamsulosin  0.4 mg Oral QPC breakfast   Continuous Infusions:  sodium chloride 250 mL (09/08/19 0103)   lactated ringers 125 mL/hr at 09/08/19 0210   magnesium sulfate bolus IVPB 4 g (09/09/19 0817)   methocarbamol (ROBAXIN) IV  500 mg (09/08/19 0205)     LOS: 2 days   Time spent= 35 mins    Yazen Rosko Arsenio Loader, MD [facility]  If 7PM-7AM, please contact night-coverage  09/09/2019, 10:14 AM

## 2019-09-09 NOTE — Progress Notes (Signed)
Called answering services about Pt's saturated incision dressing. No notes are noted about dressing or if able to change it. Also, let them know Pt asked about Meloxicam for his foot.

## 2019-09-09 NOTE — Progress Notes (Signed)
NP Josh called and stated to change the dsg and if any concerns to call back. The meloxicam will be held until the attending assess in the AM.

## 2019-09-09 NOTE — Progress Notes (Signed)
Patient ID: [name] [name], male   DOB: 1940/07/27, 79 y.o.   MRN: [id number] Vital signs stable Patient notes that he still feels weak particularly in his left lower extremity Pain still is a modest issue but seems to be coming under better control Patient functioned independently at home however since his admission he has been considerably weaker.  I discussed with him the need for possibly acute rehabilitation stay.  I will ask rehabilitation medicine to see him in the hospital if he is appropriate for comprehensive inpatient rehabilitation would be good to transfer him soon Otherwise his incisions appear to be doing well and primary concerns is ambulatory safety and weakness in his lower extremities.  I did ask Dr. Frederik Pear to review his radiographs of the lumbar spine and of the hip particularly he notes that his hip prosthesis and the lucencies noted on the recent x-rays have been stable for the past 4 years time since his last revision.

## 2019-09-09 NOTE — Progress Notes (Signed)
Occupational Therapy Treatment Patient Details Name: [name] [name] MRN: [id number] DOB: 06-13-1940 Today's Date: 09/09/2019    History of present illness pt is a 79 y/o male with h/o HTN, L hip ORIF, THA then revision, L23 fusion 04/2018, admitted with neurogenic claudication and radiculopathy.  Pt s/p lami and decompression of L34 and L45 with PLIF, PLA, spacers and bone grafting.   OT comments  This 79 yo male seen today to go over AE and let him practice with it. He was able to complete more of his LBD on his own. I gave him the handout on AE so he would know the pieces he would need to get if he decides to get them. Pt is progressing slowly from a mobility standpoint due to increased pain and his help at home cannot A him as much as he needs it right now, so D/C recommendation is now changed to SNF  Follow Up Recommendations  SNF;Supervision/Assistance - 24 hour    Equipment Recommendations  3 in 1 bedside commode       Precautions / Restrictions Precautions Precautions: Back;Fall Precaution Booklet Issued: Yes (comment) Required Braces or Orthoses: Spinal Brace Spinal Brace: Lumbar corset;Applied in sitting position Restrictions Weight Bearing Restrictions: No       Mobility Bed Mobility               General bed mobility comments: Pt up in recliner upon my entry  Transfers Overall transfer level: Needs assistance Equipment used: Rolling walker (2 wheeled) Transfers: Sit to/from Stand Sit to Stand: Min assist         General transfer comment: VCs for sit<>stand without bending too much; for ambulation he can only go very short distances due to pain increasing the more he goes    Balance Overall balance assessment: Needs assistance;History of Falls Sitting-balance support: No upper extremity supported;Feet supported Sitting balance-Leahy Scale: Good     Standing balance support: Bilateral upper extremity supported Standing balance-Leahy Scale: Poor Standing  balance comment: heavy reliance on RW                           ADL either performed or assessed with clinical judgement   ADL Overall ADL's : Needs assistance/impaired               Lower Body Bathing Details (indicate cue type and reason): We discussed the use of a long handled sponge     Lower Body Dressing: Minimal assistance;With adaptive equipment;Adhering to back precautions;Sit to/from stand Lower Body Dressing Details (indicate cue type and reason): Educated pt on use of reacher for donning shorts and doffing socks (pt practiced these), sock aid for donning socks (pt practiced these), long handle shoe horn Toilet Transfer: Minimal assistance;RW Toilet Transfer Details (indicate cue type and reason): simulated recliner>door>back to recliner   Toileting - Clothing Manipulation Details (indicate cue type and reason): Educated pt on use of toilet aid for back peri area, he reports he would rather just try to not use this--that he had to wipe eariler today and felt he did find but admitted that he did do some twisting             Vision Baseline Vision/History: No visual deficits            Cognition Arousal/Alertness: Awake/alert Behavior During Therapy: WFL for tasks assessed/performed Overall Cognitive Status: Within Functional Limits for tasks assessed  General Comments has had falls in the past     Pertinent Vitals/ Pain       Pain Assessment: 0-10 Pain Score: 5  Pain Location: back Pain Descriptors / Indicators: Discomfort;Guarding;Aching;Grimacing Pain Intervention(s): Limited activity within patient's tolerance;Monitored during session;Repositioned         Frequency  Min 2X/week        Progress Toward Goals  OT Goals(current goals can now be found in the care plan section)  Progress towards OT goals: Progressing toward goals  Acute Rehab OT Goals Patient Stated  Goal: I think I really need to rehab first before I go home OT Goal Formulation: With patient Time For Goal Achievement: 09/22/19 Potential to Achieve Goals: Good  Plan Discharge plan needs to be updated       AM-PAC OT "6 Clicks" Daily Activity     Outcome Measure   Help from another person eating meals?: None Help from another person taking care of personal grooming?: A Little Help from another person toileting, which includes using toliet, bedpan, or urinal?: A Lot Help from another person bathing (including washing, rinsing, drying)?: A Little (with AE) Help from another person to put on and taking off regular upper body clothing?: A Little Help from another person to put on and taking off regular lower body clothing?: A Little (with AE) 6 Click Score: 18    End of Session Equipment Utilized During Treatment: Gait belt;Rolling walker;Back brace  OT Visit Diagnosis: Unsteadiness on feet (R26.81);Other abnormalities of gait and mobility (R26.89);History of falling (Z91.81);Pain;Muscle weakness (generalized) (M62.81) Pain - part of body:  (back)   Activity Tolerance Patient limited by pain   Patient Left in chair;with call bell/phone within reach   Nurse Communication  (recommendation changing to SNF)        Time: 1443-1540 OT Time Calculation (min): 35 min  Charges: OT General Charges $OT Visit: 1 Visit OT Treatments $Self Care/Home Management : 23-37 mins  Golden Circle, OTR/L Acute [employer] Pager [phone] Office [phone]      Almon Register 09/09/2019, 4:17 PM

## 2019-09-09 NOTE — Evaluation (Signed)
Physical Therapy Evaluation Patient Details Name: [name] [name] MRN: [id number] DOB: April 16, 1940 Today's Date: 09/09/2019   History of Present Illness  pt is a 79 y/o male with h/o HTN, L hip ORIF, THA then revision, L23 fusion 04/2018, admitted with neurogenic claudication and radiculopathy.  Pt s/p lami and decompression of L34 and L45 with PLIF, PLA, spacers and bone grafting.  Clinical Impression  Patient required increased assist with gait and transfers today. He reported increased weakness in right leg with ambulation. He feels at this time he may not be strong enough to go home. He has a high baseline mobility. He may benefit most from skilled rehab at [facility]. Skilled acute therapy will continue to work with the patient.     Follow Up Recommendations CIR;SNF    Equipment Recommendations  Other (comment);None recommended by PT    Recommendations for Other Services       Precautions / Restrictions Precautions Precautions: Back;Fall Precaution Booklet Issued: Yes (comment) Required Braces or Orthoses: Spinal Brace Spinal Brace: Lumbar corset;Applied in sitting position Restrictions Weight Bearing Restrictions: No      Mobility  Bed Mobility               General bed mobility comments: patient found on the commode   Transfers Overall transfer level: Needs assistance Equipment used: Rolling walker (2 wheeled) Transfers: Sit to/from Stand Sit to Stand: Min assist;Min guard         General transfer comment: min gaurd at first but as he became more fastigue min a to sit down safely and to stand from a chair after a rest break.   Ambulation/Gait Ambulation/Gait assistance: Min guard;Min assist Gait Distance (Feet): 40 Feet Assistive device: Rolling walker (2 wheeled) Gait Pattern/deviations: Step-through pattern Gait velocity: slower Gait velocity interpretation: <1.31 ft/sec, indicative of household ambulator General Gait Details: after about 220 feet the patient  started to report fatigue in his left leg. He required in a to make it to a chair in his door. he required a seated rest break break before finishing his walk back to the chair.   Stairs            Wheelchair Mobility    Modified Rankin (Stroke Patients Only)       Balance Overall balance assessment: Needs assistance;History of Falls Sitting-balance support: No upper extremity supported Sitting balance-Leahy Scale: Good     Standing balance support: Bilateral upper extremity supported Standing balance-Leahy Scale: Poor                               Pertinent Vitals/Pain Pain Assessment: 0-10 Pain Score: 5  Pain Location: bottom and back Pain Descriptors / Indicators: Discomfort;Guarding;Aching Pain Intervention(s): Limited activity within patient's tolerance;Monitored during session    Home Living                        Prior Function                 Hand Dominance        Extremity/Trunk Assessment                Communication      Cognition Arousal/Alertness: Awake/alert Behavior During Therapy: WFL for tasks assessed/performed Overall Cognitive Status: Within Functional Limits for tasks assessed  General Comments General comments (skin integrity, edema, etc.): has had falls in the past     Exercises     Assessment/Plan    PT Assessment    PT Problem List         PT Treatment Interventions      PT Goals (Current goals can be found in the Care Plan section)  Acute Rehab PT Goals Patient Stated Goal: able to go on my 2 planned trips, but really just get better PT Goal Formulation: With patient Time For Goal Achievement: 09/12/19 Potential to Achieve Goals: Good    Frequency Min 5X/week   Barriers to discharge        Co-evaluation               AM-PAC PT "6 Clicks" Mobility  Outcome Measure Help needed turning from your back to your side  while in a flat bed without using bedrails?: A Little Help needed moving from lying on your back to sitting on the side of a flat bed without using bedrails?: A Little Help needed moving to and from a bed to a chair (including a wheelchair)?: A Little Help needed standing up from a chair using your arms (e.g., wheelchair or bedside chair)?: A Lot Help needed to walk in hospital room?: A Lot Help needed climbing 3-5 steps with a railing? : A Lot 6 Click Score: 15    End of Session Equipment Utilized During Treatment: Back brace Activity Tolerance: Patient tolerated treatment well;Patient limited by pain Patient left: with call bell/phone within reach;Other (comment) Nurse Communication: Mobility status PT Visit Diagnosis: Other abnormalities of gait and mobility (R26.89);Pain Pain - Right/Left: Left Pain - part of body:  (low back )    Time: 6073-7106 PT Time Calculation (min) (ACUTE ONLY): 23 min   Charges:     PT Treatments $Gait Training: 8-22 mins $Therapeutic Activity: 8-22 mins          Carney Living PT DPT  09/09/2019, 1:41 PM

## 2019-09-10 LAB — CBC
HCT: 30.5 % — ABNORMAL LOW (ref 39.0–52.0)
Hemoglobin: 9.4 g/dL — ABNORMAL LOW (ref 13.0–17.0)
MCH: 26 pg (ref 26.0–34.0)
MCHC: 30.8 g/dL (ref 30.0–36.0)
MCV: 84.3 fL (ref 80.0–100.0)
Platelets: 233 10*3/uL (ref 150–400)
RBC: 3.62 MIL/uL — ABNORMAL LOW (ref 4.22–5.81)
RDW: 15.7 % — ABNORMAL HIGH (ref 11.5–15.5)
WBC: 10.5 10*3/uL (ref 4.0–10.5)
nRBC: 0 % (ref 0.0–0.2)

## 2019-09-10 LAB — BASIC METABOLIC PANEL
Anion gap: 9 (ref 5–15)
BUN: 27 mg/dL — ABNORMAL HIGH (ref 8–23)
CO2: 22 mmol/L (ref 22–32)
Calcium: 8.6 mg/dL — ABNORMAL LOW (ref 8.9–10.3)
Chloride: 101 mmol/L (ref 98–111)
Creatinine, Ser: 1.62 mg/dL — ABNORMAL HIGH (ref 0.61–1.24)
GFR calc Af Amer: 47 mL/min — ABNORMAL LOW (ref >60)
GFR calc non Af Amer: 41 mL/min — ABNORMAL LOW (ref >60)
Glucose, Bld: 140 mg/dL — ABNORMAL HIGH (ref 70–99)
Potassium: 4.3 mmol/L (ref 3.5–5.1)
Sodium: 132 mmol/L — ABNORMAL LOW (ref 135–145)

## 2019-09-10 LAB — GLUCOSE, CAPILLARY
Glucose-Capillary: 127 mg/dL — ABNORMAL HIGH (ref 70–99)
Glucose-Capillary: 105 mg/dL — ABNORMAL HIGH (ref 70–99)
Glucose-Capillary: 155 mg/dL — ABNORMAL HIGH (ref 70–99)
Glucose-Capillary: 158 mg/dL — ABNORMAL HIGH (ref 70–99)

## 2019-09-10 LAB — MAGNESIUM: Magnesium: 2.1 mg/dL (ref 1.7–2.4)

## 2019-09-10 MED ORDER — MELOXICAM 7.5 MG PO TABS
7.5000 mg | ORAL_TABLET | Freq: Every day | ORAL | Status: DC
  Administered 2019-09-10: 15 mg via ORAL
  Administered 2019-09-11: 7.5 mg via ORAL
  Administered 2019-09-12 – 2019-09-15 (×4): 15 mg via ORAL
  Administered 2019-09-16: 7.5 mg via ORAL
  Administered 2019-09-17 – 2019-09-18 (×2): 15 mg via ORAL
  Filled 2019-09-10 (×9): qty 2

## 2019-09-10 NOTE — Progress Notes (Signed)
Subjective: Patient reports LLE weakness and moderate pain that he stated was improved from yesterday.   Objective: Vital signs in last 24 hours: Temp:  [98.2 F (36.8 C)-98.7 F (37.1 C)] 98.5 F (36.9 C) (07/11 1205) Pulse Rate:  [68-91] 86 (07/11 1205) Resp:  [17-18] 18 (07/11 1205) BP: (94-138)/(52-74) 138/72 (07/11 1205) SpO2:  [97 %-100 %] 100 % (07/11 1205)  Intake/Output from previous day: 07/10 0701 - 07/11 0700 In: 300 [P.O.:300] Out: -  Intake/Output this shift: No intake/output data recorded.  Physical Exam: The patient is alert, conversant, and O X3. He is MAEW but has some weakness in his LLE. Dressings are dry and intact with a small amount of marked drainage on his back dressing.   Lab Results: Recent Labs    09/09/19 0429 09/10/19 0413  WBC 10.1 10.5  HGB 10.0* 9.4*  HCT 32.7* 30.5*  PLT 245 233   BMET Recent Labs    09/09/19 0429 09/10/19 0413  NA 136 132*  K 4.4 4.3  CL 105 101  CO2 21* 22  GLUCOSE 138* 140*  BUN 27* 27*  CREATININE 1.63* 1.62*  CALCIUM 8.3* 8.6*    Studies/Results: No results found.  Assessment/Plan: Patient was evaluated for admission to rehab and he appears to be appropriate for CIR. Rehab to continue monitoring patient during inpatient stay. Continue working with therapies.     LOS: 3 days    Peggyann Shoals, MD 09/10/2019, 3:19 PM

## 2019-09-10 NOTE — Progress Notes (Signed)
PROGRESS NOTE    [name] [name]  OVF:[id number] DOB: 1940-11-09 DOA: 09/05/2019 PCP: Unk Pinto, MD   Brief Narrative:  79 y/o with PMH of HTN, pre-DM, CKD stage II, obese male, L2-3 posterior lumbar fusion in 2020, left Hip OA s/p ORIF, total hip arthroplasty and revision who presented with worsening of low back pain and B/L weakness. His exam was concerning for cord impingement and he was transferred to [facility] for MRI of his thoracic and lumbar spine. He reports an increase in his lowe back pain over the last three months. Lumbar MRI revealed L3-5 spinal stenosis.  Status post laminectomy/decompression L3-L5, fusion on 7/8.   Assessment & Plan:   Active Problems:   Stricture and stenosis of esophagus   Hyperlipidemia   Hypertension   GERD (gastroesophageal reflux disease)   CKD (chronic kidney disease) stage 3, GFR 30-59 ml/min (HCC)   Spinal stenosis   Acute kidney injury on CKD stage II -Baseline creatinine 1.4.  Peaked at 2.1, now remained stable at 1.6 Restart Mobic now that his renal function is pretty much back to baseline, this should help and participate in physical therapy.  Essential hypertension, improved -Continue Norvasc 5 mg daily, losartan 100 mg daily, metoprolol 12.5 mg p.o. twice daily  Diabetes mellitus type 2, hemoglobin A1c 6.6 -Continue insulin sliding scale and Accu-Chek  BPH -Flomax  Abnormal prostatic hip, show some lucency around prosthetic hip joints Ortho consulted, vitamin D normal limits.  CRP negative.  Spinal stenosis with early signs of spinal cord impingement.   Status post laminectomy/decompression of lumbar spine with fusion on 7/8 Management per primary team.  Incentive spirometer and flutter valve Pain control with bowel regimen    DVT prophylaxis: Per primary team Code Status: Full code   Body mass index is 35.38 kg/m.    Subjective: Requesting me to restart his Mobic for his foot pain as it has worked very well for  him and will help him participate in therapy.  Review of Systems Otherwise negative except as per HPI, including: General: Denies fever, chills, night sweats or unintended weight loss. Resp: Denies cough, wheezing, shortness of breath. Cardiac: Denies chest pain, palpitations, orthopnea, paroxysmal nocturnal dyspnea. GI: Denies abdominal pain, nausea, vomiting, diarrhea or constipation GU: Denies dysuria, frequency, hesitancy or incontinence MS: Denies muscle aches, joint pain or swelling Neuro: Denies headache, neurologic deficits (focal weakness, numbness, tingling), abnormal gait Psych: Denies anxiety, depression, SI/HI/AVH Skin: Denies new rashes or lesions ID: Denies sick contacts, exotic exposures, travel Examination: Constitutional: Not in acute distress Respiratory: Clear to auscultation bilaterally Cardiovascular: Normal sinus rhythm, no rubs Abdomen: Nontender nondistended good bowel sounds Musculoskeletal: No edema noted Skin: No rashes seen Neurologic: CN 2-12 grossly intact.  And nonfocal Psychiatric: Normal judgment and insight. Alert and oriented x 3. Normal mood. Objective: Vitals:   09/09/19 1550 09/09/19 1922 09/09/19 2343 09/10/19 0448  BP: (!) 94/52 121/67 116/64 121/74  Pulse: 68 91 75 81  Resp: 18 17 17 18   Temp: 98.3 F (36.8 C) 98.2 F (36.8 C) 98.7 F (37.1 C) 98.3 F (36.8 C)  TempSrc: Oral Oral Oral Oral  SpO2: 98% 99% 98% 97%  Weight:      Height:        Intake/Output Summary (Last 24 hours) at 09/10/2019 0956 Last data filed at 09/09/2019 1413 Gross per 24 hour  Intake 150 ml  Output --  Net 150 ml   Filed Weights   09/05/19 0245 09/07/19 1644  Weight: 115  kg 115 kg     Data Reviewed:   CBC: Recent Labs  Lab 09/05/19 0402 09/07/19 0411 09/08/19 0505 09/09/19 0429 09/10/19 0413  WBC 7.0 8.3 13.8* 10.1 10.5  NEUTROABS 4.1  --   --   --   --   HGB 12.2* 11.8* 10.7* 10.0* 9.4*  HCT 39.6 37.6* 34.9* 32.7* 30.5*  MCV 83.7 83.0  86.0 85.8 84.3  PLT 293 324 313 245 622   Basic Metabolic Panel: Recent Labs  Lab 09/06/19 0448 09/07/19 0411 09/08/19 0505 09/09/19 0429 09/10/19 0413  NA 134* 136 133* 136 132*  K 4.4 3.7 5.2* 4.4 4.3  CL 101 104 104 105 101  CO2 21* 22 19* 21* 22  GLUCOSE 152* 148* 170* 138* 140*  BUN 18 25* 28* 27* 27*  CREATININE 1.47* 1.61* 2.07* 1.63* 1.62*  CALCIUM 9.4 9.2 8.2* 8.3* 8.6*  MG  --  1.8 1.6* 1.6* 2.1   GFR: Estimated Creatinine Clearance: 50 mL/min (A) (by C-G formula based on SCr of 1.62 mg/dL (H)). Liver Function Tests: Recent Labs  Lab 09/05/19 0402  AST 46*  ALT 23  ALKPHOS 25*  BILITOT 1.1  PROT 6.9  ALBUMIN 3.8   No results for input(s): LIPASE, AMYLASE in the last 168 hours. No results for input(s): AMMONIA in the last 168 hours. Coagulation Profile: No results for input(s): INR, PROTIME in the last 168 hours. Cardiac Enzymes: No results for input(s): CKTOTAL, CKMB, CKMBINDEX, TROPONINI in the last 168 hours. BNP (last 3 results) No results for input(s): PROBNP in the last 8760 hours. HbA1C: No results for input(s): HGBA1C in the last 72 hours.  CBG: Recent Labs  Lab 09/09/19 0752 09/09/19 1107 09/09/19 1504 09/09/19 2128 09/10/19 0619  GLUCAP 144* 127* 157* 176* 127*   Lipid Profile: No results for input(s): CHOL, HDL, LDLCALC, TRIG, CHOLHDL, LDLDIRECT in the last 72 hours. Thyroid Function Tests: No results for input(s): TSH, T4TOTAL, FREET4, T3FREE, THYROIDAB in the last 72 hours. Anemia Panel: No results for input(s): VITAMINB12, FOLATE, FERRITIN, TIBC, IRON, RETICCTPCT in the last 72 hours. Sepsis Labs: No results for input(s): PROCALCITON, LATICACIDVEN in the last 168 hours.  Recent Results (from the past 240 hour(s))  SARS Coronavirus 2 by RT PCR (hospital order, performed in [facility] hospital lab) Nasopharyngeal Nasopharyngeal Swab     Status: None   Collection Time: 09/05/19  4:01 AM   Specimen: Nasopharyngeal Swab  Result  Value Ref Range Status   SARS Coronavirus 2 NEGATIVE NEGATIVE Final    Comment: (NOTE) SARS-CoV-2 target nucleic acids are NOT DETECTED.  The SARS-CoV-2 RNA is generally detectable in upper and lower respiratory specimens during the acute phase of infection. The lowest concentration of SARS-CoV-2 viral copies this assay can detect is 250 copies / mL. A negative result does not preclude SARS-CoV-2 infection and should not be used as the sole basis for treatment or other patient management decisions.  A negative result may occur with improper specimen collection / handling, submission of specimen other than nasopharyngeal swab, presence of viral mutation(s) within the areas targeted by this assay, and inadequate number of viral copies (<250 copies / mL). A negative result must be combined with clinical observations, patient history, and epidemiological information.  Fact Sheet for Patients:   StrictlyIdeas.no  Fact Sheet for Healthcare Providers: BankingDealers.co.za  This test is not yet approved or  cleared by the [place] FDA and has been authorized for detection and/or diagnosis of SARS-CoV-2 by FDA under  an Emergency Use Authorization (EUA).  This EUA will remain in effect (meaning this test can be used) for the duration of the COVID-19 declaration under Section 564(b)(1) of the Act, 21 U.S.C. section 360bbb-3(b)(1), unless the authorization is terminated or revoked sooner.  Performed at [facility], [place] [address], [place], [place] [address]   Surgical PCR screen     Status: Abnormal   Collection Time: 09/07/19  5:57 AM   Specimen: Nasal Mucosa; Nasal Swab  Result Value Ref Range Status   MRSA, PCR NEGATIVE NEGATIVE Final   Staphylococcus aureus POSITIVE (A) NEGATIVE Final    Comment: (NOTE) The Xpert SA Assay (FDA approved for NASAL specimens in patients 40 years of age and older), is one component  of a comprehensive surveillance program. It is not intended to diagnose infection nor to guide or monitor treatment. Performed at [facility], [place] [address] [place], [place] [address]          Radiology Studies: No results found.       Scheduled Meds: . amLODipine  5 mg Oral Daily  . docusate sodium  100 mg Oral BID  . fenofibrate  160 mg Oral Daily  . finasteride  5 mg Oral Daily  . insulin aspart  0-5 Units Subcutaneous QHS  . insulin aspart  0-9 Units Subcutaneous TID WC  . losartan  100 mg Oral Daily  . magnesium oxide  200 mg Oral BID  . meloxicam  7.5-15 mg Oral Daily  . metoprolol tartrate  12.5 mg Oral BID  . mupirocin ointment  1 application Nasal BID  . pantoprazole  40 mg Oral BID  . senna  1 tablet Oral BID  . sodium chloride flush  3 mL Intravenous Q12H  . tamsulosin  0.4 mg Oral QPC breakfast   Continuous Infusions: . sodium chloride 250 mL (09/08/19 0103)  . lactated ringers 125 mL/hr at 09/08/19 0210  . methocarbamol (ROBAXIN) IV 500 mg (09/08/19 0205)     LOS: 3 days   Time spent= 35 mins    Hjalmar Ballengee Arsenio Loader, MD [facility]  If 7PM-7AM, please contact night-coverage  09/10/2019, 9:56 AM

## 2019-09-10 NOTE — Progress Notes (Signed)
Physical Therapy Treatment Patient Details Name: [name] [name] MRN: [id number] DOB: [birth year] Today's Date: 09/10/2019    History of Present Illness pt is a 79 y/o male with h/o HTN, L hip ORIF, THA then revision, L23 fusion 04/2018, admitted with neurogenic claudication and radiculopathy.  Pt s/p lami and decompression of L34 and L45 with PLIF, PLA, spacers and bone grafting.    PT Comments    Pt progressing towards physical therapy goals. He reports feeling better today after new medication was given, and that LE's feel stronger. Despite this, pt continues to demonstrate antalgic gait pattern consistent with LE weakness (bilateral), and fatigued quickly with gait training. Chair follow utilized this session and pt required urgent stand>sit as he felt his LE's were going to give out on him. Recommend continued rehab at the CIR level to maximize functional independence and safety prior to return home with wife. My anticipation is that pt will progress well with continued rehab follow-up, and that he will be able to achieve a supervision level of mobility by the time he is ready to return home. Education was reinforced for brace application/wearing schedule, precautions, and different rehab options at d/c. Will continue to follow.     Follow Up Recommendations  CIR;Supervision for mobility/OOB     Equipment Recommendations  None recommended by PT    Recommendations for Other Services       Precautions / Restrictions Precautions Precautions: Back;Fall Precaution Booklet Issued: Yes (comment) Precaution Comments: Reviewed precautions during functional mobility Required Braces or Orthoses: Spinal Brace Spinal Brace: Lumbar corset;Applied in sitting position Restrictions Weight Bearing Restrictions: No    Mobility  Bed Mobility               General bed mobility comments: Pt was received in the bathroom upon PT arrival.   Transfers Overall transfer level: Needs  assistance Equipment used: Rolling walker (2 wheeled) Transfers: Sit to/from Stand Sit to Stand: Min guard         General transfer comment: Hands-on guarding for safety to power-up to full stand. Pt appears mildly unsteady but able to complete without assistance. Once standing, pt reports brace needs to be tightened, and not able to let go of the walker to adjust brace in standing - needed to reutrn to sitting to adjust properly.   Ambulation/Gait Ambulation/Gait assistance: Min guard;Min assist Gait Distance (Feet): 100 Feet Assistive device: Rolling walker (2 wheeled) Gait Pattern/deviations: Step-through pattern;Decreased stride length;Trunk flexed Gait velocity: Decreased Gait velocity interpretation: <1.31 ft/sec, indicative of household ambulator General Gait Details: VC's for improved posture, closer walker proximity, and forward gaze. As pt fatigued, he required increased assist from min guard assist to min assist for balance support, and close chair follow was utilized as pt urgently required seated rest break in the hall.    Stairs             Wheelchair Mobility    Modified Rankin (Stroke Patients Only)       Balance Overall balance assessment: Needs assistance;History of Falls Sitting-balance support: No upper extremity supported;Feet supported Sitting balance-Leahy Scale: Good     Standing balance support: Bilateral upper extremity supported Standing balance-Leahy Scale: Poor Standing balance comment: heavy reliance on RW                            Cognition Arousal/Alertness: Awake/alert Behavior During Therapy: WFL for tasks assessed/performed Overall Cognitive Status: Within Functional Limits for tasks assessed  Exercises      General Comments        Pertinent Vitals/Pain Pain Assessment: Faces Faces Pain Scale: Hurts little more Pain Location: back Pain Descriptors /  Indicators: Discomfort;Guarding;Aching;Grimacing Pain Intervention(s): Limited activity within patient's tolerance;Monitored during session;Repositioned    Home Living                      Prior Function            PT Goals (current goals can now be found in the care plan section) Acute Rehab PT Goals Patient Stated Goal: Rehab and then home PT Goal Formulation: With patient Time For Goal Achievement: 09/12/19 Potential to Achieve Goals: Good Progress towards PT goals: Progressing toward goals    Frequency    Min 5X/week      PT Plan Discharge plan needs to be updated    Co-evaluation              AM-PAC PT "6 Clicks" Mobility   Outcome Measure  Help needed turning from your back to your side while in a flat bed without using bedrails?: A Little Help needed moving from lying on your back to sitting on the side of a flat bed without using bedrails?: A Little Help needed moving to and from a bed to a chair (including a wheelchair)?: A Little Help needed standing up from a chair using your arms (e.g., wheelchair or bedside chair)?: A Little Help needed to walk in hospital room?: A Little Help needed climbing 3-5 steps with a railing? : A Lot 6 Click Score: 17    End of Session Equipment Utilized During Treatment: Back brace Activity Tolerance: Patient tolerated treatment well;Patient limited by pain Patient left: in chair;with call bell/phone within reach Nurse Communication: Mobility status PT Visit Diagnosis: Other abnormalities of gait and mobility (R26.89);Pain Pain - Right/Left: Left Pain - part of body:  (low back )     Time: 1020-1046 PT Time Calculation (min) (ACUTE ONLY): 26 min  Charges:  $Gait Training: 23-37 mins                     [name], PT, DPT Acute Rehabilitation Services Pager: [phone] Office: [phone]    Thelma Comp 09/10/2019, 11:52 AM

## 2019-09-10 NOTE — Plan of Care (Signed)
Pt alert and oriented. Pt is on RA. Back incision dsg changed. Drainage from the center of incision.   Problem: Education: Goal: Knowledge of General Education information will improve Description: Including pain rating scale, medication(s)/side effects and non-pharmacologic comfort measures Outcome: Progressing   Problem: Health Behavior/Discharge Planning: Goal: Ability to manage health-related needs will improve Outcome: Progressing   Problem: Clinical Measurements: Goal: Ability to maintain clinical measurements within normal limits will improve Outcome: Progressing Goal: Will remain free from infection Outcome: Progressing Goal: Diagnostic test results will improve Outcome: Progressing Goal: Respiratory complications will improve Outcome: Progressing Goal: Cardiovascular complication will be avoided Outcome: Progressing   Problem: Activity: Goal: Risk for activity intolerance will decrease Outcome: Progressing   Problem: Nutrition: Goal: Adequate nutrition will be maintained Outcome: Progressing   Problem: Coping: Goal: Level of anxiety will decrease Outcome: Progressing   Problem: Elimination: Goal: Will not experience complications related to bowel motility Outcome: Progressing Goal: Will not experience complications related to urinary retention Outcome: Progressing   Problem: Pain Managment: Goal: General experience of comfort will improve Outcome: Progressing   Problem: Safety: Goal: Ability to remain free from injury will improve Outcome: Progressing   Problem: Skin Integrity: Goal: Risk for impaired skin integrity will decrease Outcome: Progressing   Problem: Education: Goal: Ability to verbalize activity precautions or restrictions will improve Outcome: Progressing Goal: Knowledge of the prescribed therapeutic regimen will improve Outcome: Progressing Goal: Understanding of discharge needs will improve Outcome: Progressing   Problem:  Activity: Goal: Ability to avoid complications of mobility impairment will improve Outcome: Progressing Goal: Ability to tolerate increased activity will improve Outcome: Progressing Goal: Will remain free from falls Outcome: Progressing   Problem: Bowel/Gastric: Goal: Gastrointestinal status for postoperative course will improve Outcome: Progressing   Problem: Clinical Measurements: Goal: Ability to maintain clinical measurements within normal limits will improve Outcome: Progressing Goal: Postoperative complications will be avoided or minimized Outcome: Progressing Goal: Diagnostic test results will improve Outcome: Progressing   Problem: Pain Management: Goal: Pain level will decrease Outcome: Progressing   Problem: Skin Integrity: Goal: Will show signs of wound healing Outcome: Progressing   Problem: Health Behavior/Discharge Planning: Goal: Identification of resources available to assist in meeting health care needs will improve Outcome: Progressing   Problem: Bladder/Genitourinary: Goal: Urinary functional status for postoperative course will improve Outcome: Progressing

## 2019-09-10 NOTE — Progress Notes (Signed)
Inpatient Rehab Admissions Coordinator:  Pt's wife, Vaughan Basta returned call. Explained CIR goals and expectations to her.  She acknowledged understanding of goals and expectations.  She verified that although she is available 24/7, she is unable to physically assist pt.  She indicated a daughter who lives close by can provide assistance as needed. Also indicated that another daughter, Caryl Pina, will be coming to town soon.  Vaughan Basta mentioned that their housekeeper, Raymondo Band has offered to be available to assist more days of week after pt discharges from hospital.  Wife gave permission to contact Caren Griffins. Spoke with Caren Griffins on the phone. She acknowledged understanding of CIR goals and expectations.  She indicated that she would be able to assist ~5 days after pt is discharged from hospital.   Will continue to monitor pt's progress with acute therapies.  Gayland Curry, [place], [place] Admissions Coordinator [phone]

## 2019-09-10 NOTE — Progress Notes (Signed)
  Inpatient Rehab Admissions:  Inpatient Rehab Consult received.  I met with pt at the bedside for rehabilitation assessment and to discuss goals and expectations of an inpatient rehab admission.  Pt acknowledged understanding of CIR goals and expectations. Pt appears to be an appropriate CIR candidate. However, pt's caregiver can provide set up/supervision assistance and no physical assistance once pt is discharged from hospital.  Will continue to monitor pt's progress with acute therapies and medical workup.  Signed: Gayland Curry, [place], [place] Admissions Coordinator [phone]

## 2019-09-11 DIAGNOSIS — I509 Heart failure, unspecified: Secondary | ICD-10-CM

## 2019-09-11 DIAGNOSIS — E1169 Type 2 diabetes mellitus with other specified complication: Secondary | ICD-10-CM

## 2019-09-11 DIAGNOSIS — E785 Hyperlipidemia, unspecified: Secondary | ICD-10-CM

## 2019-09-11 DIAGNOSIS — K222 Esophageal obstruction: Secondary | ICD-10-CM

## 2019-09-11 LAB — BASIC METABOLIC PANEL
Anion gap: 8 (ref 5–15)
BUN: 21 mg/dL (ref 8–23)
CO2: 22 mmol/L (ref 22–32)
Calcium: 8.3 mg/dL — ABNORMAL LOW (ref 8.9–10.3)
Chloride: 102 mmol/L (ref 98–111)
Creatinine, Ser: 1.29 mg/dL — ABNORMAL HIGH (ref 0.61–1.24)
GFR calc Af Amer: >60 mL/min (ref >60)
GFR calc non Af Amer: 53 mL/min — ABNORMAL LOW (ref >60)
Glucose, Bld: 174 mg/dL — ABNORMAL HIGH (ref 70–99)
Potassium: 4 mmol/L (ref 3.5–5.1)
Sodium: 132 mmol/L — ABNORMAL LOW (ref 135–145)

## 2019-09-11 LAB — CBC
HCT: 27.8 % — ABNORMAL LOW (ref 39.0–52.0)
Hemoglobin: 8.7 g/dL — ABNORMAL LOW (ref 13.0–17.0)
MCH: 26.4 pg (ref 26.0–34.0)
MCHC: 31.3 g/dL (ref 30.0–36.0)
MCV: 84.5 fL (ref 80.0–100.0)
Platelets: 252 10*3/uL (ref 150–400)
RBC: 3.29 MIL/uL — ABNORMAL LOW (ref 4.22–5.81)
RDW: 15.8 % — ABNORMAL HIGH (ref 11.5–15.5)
WBC: 8.9 10*3/uL (ref 4.0–10.5)
nRBC: 0 % (ref 0.0–0.2)

## 2019-09-11 LAB — MAGNESIUM: Magnesium: 1.8 mg/dL (ref 1.7–2.4)

## 2019-09-11 LAB — GLUCOSE, CAPILLARY
Glucose-Capillary: 126 mg/dL — ABNORMAL HIGH (ref 70–99)
Glucose-Capillary: 121 mg/dL — ABNORMAL HIGH (ref 70–99)
Glucose-Capillary: 147 mg/dL — ABNORMAL HIGH (ref 70–99)
Glucose-Capillary: 164 mg/dL — ABNORMAL HIGH (ref 70–99)

## 2019-09-11 MED FILL — Heparin Sodium (Porcine) Inj 1000 Unit/ML: INTRAMUSCULAR | Qty: 30 | Status: AC

## 2019-09-11 MED FILL — Sodium Chloride IV Soln 0.9%: INTRAVENOUS | Qty: 1000 | Status: AC

## 2019-09-11 MED FILL — Sodium Chloride Irrigation Soln 0.9%: Qty: 3000 | Status: AC

## 2019-09-11 NOTE — Progress Notes (Signed)
Physical Therapy Treatment Patient Details Name: [name] [name] MRN: [id number] DOB: [birth year] Today's Date: 09/11/2019    History of Present Illness pt is a 79 y/o male with h/o HTN, L hip ORIF, THA then revision, L23 fusion 04/2018, admitted with neurogenic claudication and radiculopathy.  Pt s/p lami and decompression of L34 and L45 with PLIF, PLA, spacers and bone grafting.    PT Comments    Pt progressing towards physical therapy goals. Was able to perform transfers and ambulation with up to heavy min assist for safe transitions and occasional LE buckling. Pt required seated rest break in the hallway as legs appeared to be giving out on him. Could manage with +1 but +2 is helpful to be able to keep the chair closer to the patient while still effectively guarding him. Pt continues to be appropriate for CIR level rehab at d/c to maximize functional independence and safety, decrease risk for falls, and improve tolerance for functional activity. Will continue to follow acutely.     Follow Up Recommendations  CIR;Supervision for mobility/OOB     Equipment Recommendations  None recommended by PT    Recommendations for Other Services       Precautions / Restrictions Precautions Precautions: Back;Fall Precaution Booklet Issued: Yes (comment) Precaution Comments: Reviewed precautions during functional mobility Required Braces or Orthoses: Spinal Brace Spinal Brace: Lumbar corset;Applied in sitting position Restrictions Weight Bearing Restrictions: No    Mobility  Bed Mobility Overal bed mobility: Needs Assistance Bed Mobility: Rolling;Sidelying to Sit;Sit to Sidelying Rolling: Supervision Sidelying to sit: Min guard     Sit to sidelying: Mod assist General bed mobility comments: Assist to elevate LE's up into bed at end of session. Pt required increased time to transition from sidelying to sit and appeared very effortful.   Transfers Overall transfer level: Needs  assistance Equipment used: Rolling walker (2 wheeled) Transfers: Sit to/from Stand Sit to Stand: Min assist         General transfer comment: Heavy min assist to power-up to full standing position. Uncoordinated reach from bed to walker to brace himself in standing.   Ambulation/Gait Ambulation/Gait assistance: Min assist Gait Distance (Feet): 150 Feet Assistive device: Rolling walker (2 wheeled) Gait Pattern/deviations: Step-through pattern;Decreased stride length;Trunk flexed Gait velocity: Decreased Gait velocity interpretation: <1.31 ft/sec, indicative of household ambulator General Gait Details: VC's for improved posture, closer walker proximity, and forward gaze. As pt fatigued, he required increased assist from min guard assist to min assist for balance support, and close chair follow was utilized as pt urgently required seated rest break in the hall.    Stairs             Wheelchair Mobility    Modified Rankin (Stroke Patients Only)       Balance Overall balance assessment: Needs assistance;History of Falls Sitting-balance support: No upper extremity supported;Feet supported Sitting balance-Leahy Scale: Good     Standing balance support: Bilateral upper extremity supported Standing balance-Leahy Scale: Poor Standing balance comment: heavy reliance on RW                            Cognition Arousal/Alertness: Awake/alert Behavior During Therapy: WFL for tasks assessed/performed Overall Cognitive Status: Within Functional Limits for tasks assessed  Exercises      General Comments        Pertinent Vitals/Pain Pain Assessment: Faces Faces Pain Scale: Hurts even more Pain Location: back, L hip Pain Descriptors / Indicators: Discomfort;Aching;Grimacing;Operative site guarding Pain Intervention(s): Limited activity within patient's tolerance;Monitored during session;Repositioned     Home Living                      Prior Function            PT Goals (current goals can now be found in the care plan section) Acute Rehab PT Goals Patient Stated Goal: Rehab and then home PT Goal Formulation: With patient Time For Goal Achievement: 09/12/19 Potential to Achieve Goals: Good Progress towards PT goals: Progressing toward goals    Frequency    Min 5X/week      PT Plan Discharge plan needs to be updated    Co-evaluation              AM-PAC PT "6 Clicks" Mobility   Outcome Measure  Help needed turning from your back to your side while in a flat bed without using bedrails?: A Little Help needed moving from lying on your back to sitting on the side of a flat bed without using bedrails?: A Little Help needed moving to and from a bed to a chair (including a wheelchair)?: A Little Help needed standing up from a chair using your arms (e.g., wheelchair or bedside chair)?: A Little Help needed to walk in hospital room?: A Little Help needed climbing 3-5 steps with a railing? : A Lot 6 Click Score: 17    End of Session Equipment Utilized During Treatment: Back brace Activity Tolerance: Patient tolerated treatment well;Patient limited by pain Patient left: in chair;with call bell/phone within reach Nurse Communication: Mobility status PT Visit Diagnosis: Other abnormalities of gait and mobility (R26.89);Pain Pain - Right/Left: Left Pain - part of body:  (low back )     Time: 6945-0388 PT Time Calculation (min) (ACUTE ONLY): 31 min  Charges:  $Gait Training: 23-37 mins                     [name], PT, DPT Acute Rehabilitation Services Pager: [phone] Office: [phone]    Thelma Comp 09/11/2019, 11:14 AM

## 2019-09-11 NOTE — Progress Notes (Signed)
PROGRESS NOTE    [name] [name]  VVO:[id number] DOB: [birth year] DOA: 09/05/2019 PCP: Unk Pinto, MD   Brief Narrative:  79 y/o with PMH of HTN, pre-DM, CKD stage II, obese male, L2-3 posterior lumbar fusion in 2020, left Hip OA s/p ORIF, total hip arthroplasty and revision who presented with worsening of low back pain and B/L weakness. His exam was concerning for cord impingement and he was transferred to [facility] for MRI of his thoracic and lumbar spine. He reports an increase in his lowe back pain over the last three months. Lumbar MRI revealed L3-5 spinal stenosis.  Status post laminectomy/decompression L3-L5, fusion on 7/8.  Tolerated the procedure well, PT recommended CIR.   Assessment & Plan:   Active Problems:   Stricture and stenosis of esophagus   Hyperlipidemia   Hypertension   GERD (gastroesophageal reflux disease)   CKD (chronic kidney disease) stage 3, GFR 30-59 ml/min (HCC)   Spinal stenosis   Chronic congestive heart failure, unspecified heart failure type ([place])   Hyperlipidemia associated with type 2 diabetes mellitus (HCC)   Acute kidney injury on CKD stage II -Baseline creatinine 1.4.  Peaked at 2.1, now remained stable around 1.6 Restart Mobic now that his renal function is pretty much back to baseline, this should help and participate in physical therapy.  Essential hypertension, improved -Continue Norvasc 5 mg daily, losartan 100 mg daily, metoprolol 12.5 mg p.o. twice daily  Diabetes mellitus type 2, hemoglobin A1c 6.6 -Continue insulin sliding scale and Accu-Chek  BPH -Flomax  Abnormal prostatic hip, show some lucency around prosthetic hip joints Ortho consulted, vitamin D normal limits.  CRP negative.  Spinal stenosis with early signs of spinal cord impingement.   Status post laminectomy/decompression of lumbar spine with fusion on 7/8 Management per primary team.  Incentive spirometer and flutter valve Pain control with bowel  regimen  Medically stable from my standpoint to be discharged to rehab    DVT prophylaxis: Per primary team Code Status: Full code   Body mass index is 35.38 kg/m.    Subjective: Sitting in the chair this morning, does not any complaints feels okay  Review of Systems Otherwise negative except as per HPI, including: General: Denies fever, chills, night sweats or unintended weight loss. Resp: Denies cough, wheezing, shortness of breath. Cardiac: Denies chest pain, palpitations, orthopnea, paroxysmal nocturnal dyspnea. GI: Denies abdominal pain, nausea, vomiting, diarrhea or constipation GU: Denies dysuria, frequency, hesitancy or incontinence MS: Denies muscle aches, joint pain or swelling Neuro: Denies headache, neurologic deficits (focal weakness, numbness, tingling), abnormal gait Psych: Denies anxiety, depression, SI/HI/AVH Skin: Denies new rashes or lesions ID: Denies sick contacts, exotic exposures, travel  Examination: Constitutional: Not in acute distress Respiratory: Clear to auscultation bilaterally Cardiovascular: Normal sinus rhythm, no rubs Abdomen: Nontender nondistended good bowel sounds Musculoskeletal: No edema noted Skin: No rashes seen Neurologic: CN 2-12 grossly intact.  And nonfocal Psychiatric: Normal judgment and insight. Alert and oriented x 3. Normal mood. Objective: Vitals:   09/10/19 1917 09/10/19 2332 09/11/19 0355 09/11/19 0813  BP: 109/74 114/69 108/60 133/74  Pulse: (!) 109 81 79 93  Resp: 18 19 19 16   Temp: 98.4 F (36.9 C) 98.9 F (37.2 C) 99 F (37.2 C) 98.2 F (36.8 C)  TempSrc: Oral Oral Oral Oral  SpO2: 100% 95% 93% 99%  Weight:      Height:       No intake or output data in the 24 hours ending 09/11/19 [place]  09/05/19 0245 09/07/19 1644  Weight: 115 kg 115 kg     Data Reviewed:   CBC: Recent Labs  Lab 09/05/19 0402 09/05/19 0402 09/07/19 0411 09/08/19 0505 09/09/19 0429 09/10/19 0413  09/11/19 0424  WBC 7.0   < > 8.3 13.8* 10.1 10.5 8.9  NEUTROABS 4.1  --   --   --   --   --   --   HGB 12.2*   < > 11.8* 10.7* 10.0* 9.4* 8.7*  HCT 39.6   < > 37.6* 34.9* 32.7* 30.5* 27.8*  MCV 83.7   < > 83.0 86.0 85.8 84.3 84.5  PLT 293   < > 324 313 245 233 252   < > = values in this interval not displayed.   Basic Metabolic Panel: Recent Labs  Lab 09/07/19 0411 09/08/19 0505 09/09/19 0429 09/10/19 0413 09/11/19 0424  NA 136 133* 136 132* 132*  K 3.7 5.2* 4.4 4.3 4.0  CL 104 104 105 101 102  CO2 22 19* 21* 22 22  GLUCOSE 148* 170* 138* 140* 174*  BUN 25* 28* 27* 27* 21  CREATININE 1.61* 2.07* 1.63* 1.62* 1.29*  CALCIUM 9.2 8.2* 8.3* 8.6* 8.3*  MG 1.8 1.6* 1.6* 2.1 1.8   GFR: Estimated Creatinine Clearance: 62.8 mL/min (A) (by C-G formula based on SCr of 1.29 mg/dL (H)). Liver Function Tests: Recent Labs  Lab 09/05/19 0402  AST 46*  ALT 23  ALKPHOS 25*  BILITOT 1.1  PROT 6.9  ALBUMIN 3.8   No results for input(s): LIPASE, AMYLASE in the last 168 hours. No results for input(s): AMMONIA in the last 168 hours. Coagulation Profile: No results for input(s): INR, PROTIME in the last 168 hours. Cardiac Enzymes: No results for input(s): CKTOTAL, CKMB, CKMBINDEX, TROPONINI in the last 168 hours. BNP (last 3 results) No results for input(s): PROBNP in the last 8760 hours. HbA1C: No results for input(s): HGBA1C in the last 72 hours.  CBG: Recent Labs  Lab 09/10/19 0619 09/10/19 1201 09/10/19 1511 09/10/19 2103 09/11/19 0604  GLUCAP 127* 105* 155* 158* 126*   Lipid Profile: No results for input(s): CHOL, HDL, LDLCALC, TRIG, CHOLHDL, LDLDIRECT in the last 72 hours. Thyroid Function Tests: No results for input(s): TSH, T4TOTAL, FREET4, T3FREE, THYROIDAB in the last 72 hours. Anemia Panel: No results for input(s): VITAMINB12, FOLATE, FERRITIN, TIBC, IRON, RETICCTPCT in the last 72 hours. Sepsis Labs: No results for input(s): PROCALCITON, LATICACIDVEN in the  last 168 hours.  Recent Results (from the past 240 hour(s))  SARS Coronavirus 2 by RT PCR (hospital order, performed in [facility] hospital lab) Nasopharyngeal Nasopharyngeal Swab     Status: None   Collection Time: 09/05/19  4:01 AM   Specimen: Nasopharyngeal Swab  Result Value Ref Range Status   SARS Coronavirus 2 NEGATIVE NEGATIVE Final    Comment: (NOTE) SARS-CoV-2 target nucleic acids are NOT DETECTED.  The SARS-CoV-2 RNA is generally detectable in upper and lower respiratory specimens during the acute phase of infection. The lowest concentration of SARS-CoV-2 viral copies this assay can detect is 250 copies / mL. A negative result does not preclude SARS-CoV-2 infection and should not be used as the sole basis for treatment or other patient management decisions.  A negative result may occur with improper specimen collection / handling, submission of specimen other than nasopharyngeal swab, presence of viral mutation(s) within the areas targeted by this assay, and inadequate number of viral copies (<250 copies / mL). A negative result must be combined  with clinical observations, patient history, and epidemiological information.  Fact Sheet for Patients:   StrictlyIdeas.no  Fact Sheet for Healthcare Providers: BankingDealers.co.za  This test is not yet approved or  cleared by the [place] FDA and has been authorized for detection and/or diagnosis of SARS-CoV-2 by FDA under an Emergency Use Authorization (EUA).  This EUA will remain in effect (meaning this test can be used) for the duration of the COVID-19 declaration under Section 564(b)(1) of the Act, 21 U.S.C. section 360bbb-3(b)(1), unless the authorization is terminated or revoked sooner.  Performed at [facility], [place] [address], [place], [place] [address]   Surgical PCR screen     Status: Abnormal   Collection Time: 09/07/19  5:57 AM   Specimen:  Nasal Mucosa; Nasal Swab  Result Value Ref Range Status   MRSA, PCR NEGATIVE NEGATIVE Final   Staphylococcus aureus POSITIVE (A) NEGATIVE Final    Comment: (NOTE) The Xpert SA Assay (FDA approved for NASAL specimens in patients 30 years of age and older), is one component of a comprehensive surveillance program. It is not intended to diagnose infection nor to guide or monitor treatment. Performed at [facility], [place] [address], [place], [place] [address]          Radiology Studies: No results found.       Scheduled Meds:  amLODipine  5 mg Oral Daily   docusate sodium  100 mg Oral BID   fenofibrate  160 mg Oral Daily   finasteride  5 mg Oral Daily   insulin aspart  0-5 Units Subcutaneous QHS   insulin aspart  0-9 Units Subcutaneous TID WC   losartan  100 mg Oral Daily   magnesium oxide  200 mg Oral BID   meloxicam  7.5-15 mg Oral Daily   metoprolol tartrate  12.5 mg Oral BID   mupirocin ointment  1 application Nasal BID   pantoprazole  40 mg Oral BID   senna  1 tablet Oral BID   sodium chloride flush  3 mL Intravenous Q12H   tamsulosin  0.4 mg Oral QPC breakfast   Continuous Infusions:  sodium chloride 250 mL (09/08/19 0103)   lactated ringers 125 mL/hr at 09/08/19 0210   methocarbamol (ROBAXIN) IV 500 mg (09/08/19 0205)     LOS: 4 days   Time spent= 25 mins    Thurley Francesconi Arsenio Loader, MD [facility]  If 7PM-7AM, please contact night-coverage  09/11/2019, 10:31 AM

## 2019-09-11 NOTE — Progress Notes (Signed)
Patient ID: [name] [name], male   DOB: 1940/07/15, 79 y.o.   MRN: [id number] Vital signs are stable Patient having some lower extremity weakness still Rehab is evaluating him for comprehensive inpatient rehabilitation Incision has had some drainage Now dressed with gauze Appears clean dry Continue supportive care Possible transfer to rehab if patient acceptable and bed available

## 2019-09-11 NOTE — Progress Notes (Signed)
Occupational Therapy Treatment Patient Details Name: [name] [name] MRN: [id number] DOB: [birth year] Today's Date: 09/11/2019    History of present illness pt is a 79 y/o male with h/o HTN, L hip ORIF, THA then revision, L23 fusion 04/2018, admitted with neurogenic claudication and radiculopathy.  Pt s/p lami and decompression of L34 and L45 with PLIF, PLA, spacers and bone grafting.   OT comments  Upon arrival, pt up in recliner finishing lunch and agreeable to skilled-OT services. Upon arrival, pt sitting with brace donned incorrectly - used max verbal cues to correct patient in donning brace correctly. Pt needing min guard for sit<>stand and ambulation to complete grooming task at sink. At sink, completed hair brushing with min guard for safety. Completed toilet transfer with min guard and verbal cues for safety. Pt able to recall 2/3 back precautions from memory - reviewed precautions during functional activities. Education was administered on brace management, compensatory techniques at sink, and precautions for increased safety with ADLs. Pt accepted and verbalized understanding. Later demonstrated understanding of brace management - donning brace with supervision. Believe pt would benefit from skilled OT services acutely and at the CIR level to progress to Mod I level to return home to PLOF safely. Will follow acutely.    Follow Up Recommendations  CIR    Equipment Recommendations  Other (comment) (TBD at next venue of care)    Recommendations for Other Services Rehab consult    Precautions / Restrictions Precautions Precautions: Back;Fall Precaution Booklet Issued: Yes (comment) Precaution Comments: Reviewed precautions during functional mobility Required Braces or Orthoses: Spinal Brace Spinal Brace: Lumbar corset;Applied in sitting position       Mobility Bed Mobility               General bed mobility comments: Pt sitting in recliner upon arrival  Transfers Overall  transfer level: Needs assistance Equipment used: Rolling walker (2 wheeled) Transfers: Sit to/from Stand Sit to Stand: Min guard         General transfer comment: Min guard with increased time    Balance Overall balance assessment: Needs assistance;History of Falls Sitting-balance support: No upper extremity supported;Feet supported Sitting balance-Leahy Scale: Good     Standing balance support: No upper extremity supported;During functional activity Standing balance-Leahy Scale: Fair Standing balance comment: able to brush hair without support and no LOB                           ADL either performed or assessed with clinical judgement   ADL Overall ADL's : Needs assistance/impaired     Grooming: Standing;Min guard;Brushing hair;Cueing for compensatory techniques;Cueing for safety Grooming Details (indicate cue type and reason): min guard for safety         Upper Body Dressing : Moderate assistance;Sitting;Standing;Cueing for safety;Cueing for sequencing Upper Body Dressing Details (indicate cue type and reason): Pt needing mod A with donning brace with max verbal cues and education on sequencing for donning brace     Toilet Transfer: Min guard;RW;Grab bars;Cueing for safety Toilet Transfer Details (indicate cue type and reason): Min guard for toilet transfer with RW for safety         Functional mobility during ADLs: Min guard;Rolling walker General ADL Comments: Edducated pt on grooming at sink and brace management/donning brace. Verbal cues need for safety awareness and sequencing.               Cognition Arousal/Alertness: Awake/alert Behavior During Therapy: WFL for tasks  assessed/performed Overall Cognitive Status: Impaired/Different from baseline Area of Impairment: Safety/judgement                         Safety/Judgement: Decreased awareness of safety;Decreased awareness of deficits     General Comments: recalled 2/3 back  precautions. Educated on compensatory techniques for grooming and pt has decreased safety awareness with back precautions during functional activities              General Comments Pt educated on compensatory techniques for grooming, safety in bathroom, precautions, and brace management.    Pertinent Vitals/ Pain       Pain Assessment: Faces Faces Pain Scale: Hurts little more Pain Location: back Pain Descriptors / Indicators: Discomfort;Aching;Grimacing;Operative site guarding Pain Intervention(s): Limited activity within patient's tolerance;Monitored during session      Frequency  Min 2X/week        Progress Toward Goals  OT Goals(current goals can now be found in the care plan section)  Progress towards OT goals: Progressing toward goals  Acute Rehab OT Goals Patient Stated Goal: Rehab and then home OT Goal Formulation: With patient Time For Goal Achievement: 09/22/19 Potential to Achieve Goals: Good  Plan Discharge plan needs to be updated       AM-PAC OT "6 Clicks" Daily Activity     Outcome Measure   Help from another person eating meals?: None Help from another person taking care of personal grooming?: A Little Help from another person toileting, which includes using toliet, bedpan, or urinal?: A Little Help from another person bathing (including washing, rinsing, drying)?: A Little (with AE) Help from another person to put on and taking off regular upper body clothing?: A Little Help from another person to put on and taking off regular lower body clothing?: A Little (with AE) 6 Click Score: 19    End of Session Equipment Utilized During Treatment: Gait belt;Rolling walker;Back brace  OT Visit Diagnosis: Unsteadiness on feet (R26.81);Other abnormalities of gait and mobility (R26.89);History of falling (Z91.81);Pain;Muscle weakness (generalized) (M62.81) Pain - part of body:  (back)   Activity Tolerance Patient tolerated treatment well   Patient Left in  chair;with call bell/phone within reach   Nurse Communication Mobility status        Time: 5929-2446 OT Time Calculation (min): 26 min  Charges: OT General Charges $OT Visit: 1 Visit OT Treatments $Self Care/Home Management : 23-37 mins  [name] [name]/OTS   [name] [name] 09/11/2019, 3:43 PM

## 2019-09-12 LAB — GLUCOSE, CAPILLARY
Glucose-Capillary: 118 mg/dL — ABNORMAL HIGH (ref 70–99)
Glucose-Capillary: 173 mg/dL — ABNORMAL HIGH (ref 70–99)
Glucose-Capillary: 127 mg/dL — ABNORMAL HIGH (ref 70–99)
Glucose-Capillary: 152 mg/dL — ABNORMAL HIGH (ref 70–99)

## 2019-09-12 MED ORDER — MAGNESIUM CITRATE PO SOLN
1.0000 | Freq: Every day | ORAL | Status: DC | PRN
  Administered 2019-09-13: 1 via ORAL
  Filled 2019-09-12: qty 296

## 2019-09-12 NOTE — Progress Notes (Addendum)
Dr. Ellene Route gave verbal order for 1 bottle magnesium citrate PRN daily for no bowel movement. MD also gave verbal order for patient to shower and then to cover surgical site with Betadine and ABD gauze.

## 2019-09-12 NOTE — Progress Notes (Signed)
Inpatient Rehabilitation Admissions Coordinator  I met with patient at bedside. I await [facility] Medicare decision on our request for CIR authorization.  Danne Baxter, RN, MSN Rehab Admissions Coordinator [phone] 09/12/2019 2:05 PM

## 2019-09-12 NOTE — Progress Notes (Signed)
PROGRESS NOTE    [name] [name]  HKV:[id number] DOB: [birth year] DOA: 09/05/2019 PCP: Unk Pinto, MD   Brief Narrative:  79 y/o with PMH of HTN, pre-DM, CKD stage II, obese male, L2-3 posterior lumbar fusion in 2020, left Hip OA s/p ORIF, total hip arthroplasty and revision who presented with worsening of low back pain and B/L weakness. His exam was concerning for cord impingement and he was transferred to [facility] for MRI of his thoracic and lumbar spine. He reports an increase in his lowe back pain over the last three months. Lumbar MRI revealed L3-5 spinal stenosis.  Status post laminectomy/decompression L3-L5, fusion on 7/8.  Tolerated the procedure well, PT recommended CIR. Hospital course slightly complicated by AKI but improved with oral hydration and IV fluids   Assessment & Plan:   Active Problems:   Stricture and stenosis of esophagus   Hyperlipidemia   Hypertension   GERD (gastroesophageal reflux disease)   CKD (chronic kidney disease) stage 3, GFR 30-59 ml/min (HCC)   Spinal stenosis   Chronic congestive heart failure, unspecified heart failure type ([place])   Hyperlipidemia associated with type 2 diabetes mellitus (HCC)   Acute kidney injury on CKD stage II -Baseline creatinine 1.4.  Peaked at 2.1, now remained stable, today 1.29 Mobic restarted which has helped him quite a bit with his right ankle pain and helping him participate in therapy.  Essential hypertension, improved -Continue Norvasc 5 mg daily, losartan 100 mg daily, metoprolol 12.5 mg p.o. twice daily  Diabetes mellitus type 2, hemoglobin A1c 6.6 -Continue insulin sliding scale and Accu-Chek  BPH -Flomax.  He thinks he might be having slight urinary retention therefore I will asked the nurses to bladder scan him post void  Abnormal prostatic hip, show some lucency around prosthetic hip joints Ortho consulted, vitamin D normal limits.  CRP negative.  Spinal stenosis with early signs of spinal cord  impingement.   Status post laminectomy/decompression of lumbar spine with fusion on 7/8 Management per primary team.  Incentive spirometer and flutter valve Pain control with bowel regimen  Medically stable from my standpoint to go to rehab    DVT prophylaxis: Per primary team Code Status: Full code   Body mass index is 35.38 kg/m.    Subjective: Sitting in the chair no complaints.  Thinks he might be having some urinary retention  Review of Systems Otherwise negative except as per HPI, including: General: Denies fever, chills, night sweats or unintended weight loss. Resp: Denies cough, wheezing, shortness of breath. Cardiac: Denies chest pain, palpitations, orthopnea, paroxysmal nocturnal dyspnea. GI: Denies abdominal pain, nausea, vomiting, diarrhea or constipation GU: Denies dysuria, frequency, hesitancy or incontinence MS: Denies muscle aches, joint pain or swelling Neuro: Denies headache, neurologic deficits (focal weakness, numbness, tingling), abnormal gait Psych: Denies anxiety, depression, SI/HI/AVH Skin: Denies new rashes or lesions ID: Denies sick contacts, exotic exposures, travel  Examination: Constitutional: Not in acute distress Respiratory: Clear to auscultation bilaterally Cardiovascular: Normal sinus rhythm, no rubs Abdomen: Nontender nondistended good bowel sounds Musculoskeletal: No edema noted Skin: No rashes seen Neurologic: CN 2-12 grossly intact.  And nonfocal Psychiatric: Normal judgment and insight. Alert and oriented x 3. Normal mood. Objective: Vitals:   09/11/19 2201 09/11/19 2313 09/12/19 0400 09/12/19 0832  BP: 129/77 123/71 115/67 (!) 146/71  Pulse: 91 87 68 92  Resp:  16 18 16   Temp:  98.7 F (37.1 C) 98 F (36.7 C) 98.5 F (36.9 C)  TempSrc:  Oral Oral Oral  SpO2: 99% 98% 98% 100%  Weight:      Height:        Intake/Output Summary (Last 24 hours) at 09/12/2019 0901 Last data filed at 09/11/2019 1516 Gross per 24 hour  Intake  120 ml  Output --  Net 120 ml    Filed Weights   09/05/19 0245 09/07/19 1644  Weight: 115 kg 115 kg     Data Reviewed:   CBC: Recent Labs  Lab 09/07/19 0411 09/08/19 0505 09/09/19 0429 09/10/19 0413 09/11/19 0424  WBC 8.3 13.8* 10.1 10.5 8.9  HGB 11.8* 10.7* 10.0* 9.4* 8.7*  HCT 37.6* 34.9* 32.7* 30.5* 27.8*  MCV 83.0 86.0 85.8 84.3 84.5  PLT 324 313 245 233 245   Basic Metabolic Panel: Recent Labs  Lab 09/07/19 0411 09/08/19 0505 09/09/19 0429 09/10/19 0413 09/11/19 0424  NA 136 133* 136 132* 132*  K 3.7 5.2* 4.4 4.3 4.0  CL 104 104 105 101 102  CO2 22 19* 21* 22 22  GLUCOSE 148* 170* 138* 140* 174*  BUN 25* 28* 27* 27* 21  CREATININE 1.61* 2.07* 1.63* 1.62* 1.29*  CALCIUM 9.2 8.2* 8.3* 8.6* 8.3*  MG 1.8 1.6* 1.6* 2.1 1.8   GFR: Estimated Creatinine Clearance: 62.8 mL/min (A) (by C-G formula based on SCr of 1.29 mg/dL (H)). Liver Function Tests: No results for input(s): AST, ALT, ALKPHOS, BILITOT, PROT, ALBUMIN in the last 168 hours.  No results for input(s): LIPASE, AMYLASE in the last 168 hours. No results for input(s): AMMONIA in the last 168 hours. Coagulation Profile: No results for input(s): INR, PROTIME in the last 168 hours. Cardiac Enzymes: No results for input(s): CKTOTAL, CKMB, CKMBINDEX, TROPONINI in the last 168 hours. BNP (last 3 results) No results for input(s): PROBNP in the last 8760 hours. HbA1C: No results for input(s): HGBA1C in the last 72 hours.  CBG: Recent Labs  Lab 09/11/19 0604 09/11/19 1129 09/11/19 1653 09/11/19 2113 09/12/19 0620  GLUCAP 126* 121* 147* 164* 118*   Lipid Profile: No results for input(s): CHOL, HDL, LDLCALC, TRIG, CHOLHDL, LDLDIRECT in the last 72 hours. Thyroid Function Tests: No results for input(s): TSH, T4TOTAL, FREET4, T3FREE, THYROIDAB in the last 72 hours. Anemia Panel: No results for input(s): VITAMINB12, FOLATE, FERRITIN, TIBC, IRON, RETICCTPCT in the last 72 hours. Sepsis Labs: No  results for input(s): PROCALCITON, LATICACIDVEN in the last 168 hours.  Recent Results (from the past 240 hour(s))  SARS Coronavirus 2 by RT PCR (hospital order, performed in [facility] hospital lab) Nasopharyngeal Nasopharyngeal Swab     Status: None   Collection Time: 09/05/19  4:01 AM   Specimen: Nasopharyngeal Swab  Result Value Ref Range Status   SARS Coronavirus 2 NEGATIVE NEGATIVE Final    Comment: (NOTE) SARS-CoV-2 target nucleic acids are NOT DETECTED.  The SARS-CoV-2 RNA is generally detectable in upper and lower respiratory specimens during the acute phase of infection. The lowest concentration of SARS-CoV-2 viral copies this assay can detect is 250 copies / mL. A negative result does not preclude SARS-CoV-2 infection and should not be used as the sole basis for treatment or other patient management decisions.  A negative result may occur with improper specimen collection / handling, submission of specimen other than nasopharyngeal swab, presence of viral mutation(s) within the areas targeted by this assay, and inadequate number of viral copies (<250 copies / mL). A negative result must be combined with clinical observations, patient history, and epidemiological information.  Fact Sheet for Patients:  StrictlyIdeas.no  Fact Sheet for Healthcare Providers: BankingDealers.co.za  This test is not yet approved or  cleared by the [place] FDA and has been authorized for detection and/or diagnosis of SARS-CoV-2 by FDA under an Emergency Use Authorization (EUA).  This EUA will remain in effect (meaning this test can be used) for the duration of the COVID-19 declaration under Section 564(b)(1) of the Act, 21 U.S.C. section 360bbb-3(b)(1), unless the authorization is terminated or revoked sooner.  Performed at [facility], [place] [address], [place], [place] [address]   Surgical PCR screen     Status:  Abnormal   Collection Time: 09/07/19  5:57 AM   Specimen: Nasal Mucosa; Nasal Swab  Result Value Ref Range Status   MRSA, PCR NEGATIVE NEGATIVE Final   Staphylococcus aureus POSITIVE (A) NEGATIVE Final    Comment: (NOTE) The Xpert SA Assay (FDA approved for NASAL specimens in patients 29 years of age and older), is one component of a comprehensive surveillance program. It is not intended to diagnose infection nor to guide or monitor treatment. Performed at [facility], [place] [address] [place], [place] [address]          Radiology Studies: No results found.       Scheduled Meds:  amLODipine  5 mg Oral Daily   docusate sodium  100 mg Oral BID   fenofibrate  160 mg Oral Daily   finasteride  5 mg Oral Daily   insulin aspart  0-5 Units Subcutaneous QHS   insulin aspart  0-9 Units Subcutaneous TID WC   losartan  100 mg Oral Daily   magnesium oxide  200 mg Oral BID   meloxicam  7.5-15 mg Oral Daily   metoprolol tartrate  12.5 mg Oral BID   mupirocin ointment  1 application Nasal BID   pantoprazole  40 mg Oral BID   senna  1 tablet Oral BID   sodium chloride flush  3 mL Intravenous Q12H   tamsulosin  0.4 mg Oral QPC breakfast   Continuous Infusions:  sodium chloride 250 mL (09/08/19 0103)   lactated ringers 125 mL/hr at 09/08/19 0210   methocarbamol (ROBAXIN) IV 500 mg (09/08/19 0205)     LOS: 5 days   Time spent= 25 mins    Shannie Kontos Arsenio Loader, MD [facility]  If 7PM-7AM, please contact night-coverage  09/12/2019, 9:01 AM

## 2019-09-12 NOTE — Progress Notes (Signed)
Patient ID: [name] [name], male   DOB: [birth year], 79 y.o.   MRN: [id number] Vital signs are stable Lower extremity weakness is still problematic for him.  Most of the weakness appears in the dorsiflexors bilaterally.  Iliopsoas have modest weakness also.  His incision is clean there is been some drainage and I would advise continuing dressing changes with Betadine swab stick.  He has not had a bowel movement since surgery and we will add magnesium citrate to his regimen.  If this does not work he may require either an enema or suppository.  Clearly too weak to go home at this point his wife could not take care of him nor is there enough family support to be able to take care of him unless he was more independent  Plan rehab stay would be of great benefit and we are waiting to hear in regards to that.

## 2019-09-12 NOTE — Plan of Care (Signed)

## 2019-09-12 NOTE — Progress Notes (Signed)
Physical Therapy Treatment Patient Details Name: [name] [name] MRN: [id number] DOB: 29-Dec-1940 Today's Date: 09/12/2019    History of Present Illness pt is a 79 y/o male with h/o HTN, L hip ORIF, THA then revision, L23 fusion 04/2018, admitted with neurogenic claudication and radiculopathy.  Pt s/p lami and decompression of L34 and L45 with PLIF, PLA, spacers and bone grafting.    PT Comments    Pt progressing well with post-op mobility. He was able to demonstrate transfers and ambulation with up to min assist for balance support and safety. Daughter present to provide chair follow this session which was helpful as pt still requires urgent seated break during hallway ambulation. Education reinforced on precautions, brace application/wearing schedule, appropriate activity progression, and car transfer. Will continue to follow.      Follow Up Recommendations  CIR;Supervision for mobility/OOB     Equipment Recommendations  None recommended by PT    Recommendations for Other Services       Precautions / Restrictions Precautions Precautions: Back;Fall Precaution Booklet Issued: Yes (comment) Precaution Comments: Reviewed precautions during functional mobility Required Braces or Orthoses: Spinal Brace Spinal Brace: Lumbar corset;Applied in sitting position Restrictions Weight Bearing Restrictions: No    Mobility  Bed Mobility               General bed mobility comments: Pt sitting in recliner upon arrival  Transfers Overall transfer level: Needs assistance Equipment used: Rolling walker (2 wheeled) Transfers: Sit to/from Stand Sit to Stand: Min guard         General transfer comment: Min guard with increased time  Ambulation/Gait Ambulation/Gait assistance: Min assist Gait Distance (Feet): 150 Feet Assistive device: Rolling walker (2 wheeled) Gait Pattern/deviations: Step-through pattern;Decreased stride length;Trunk flexed Gait velocity: Decreased Gait velocity  interpretation: <1.31 ft/sec, indicative of household ambulator General Gait Details: VC's for improved posture, closer walker proximity, and forward gaze. As pt fatigued, he required increased assist from min guard assist to min assist for balance support, and close chair follow was utilized as pt urgently required seated rest break in the hall.    Stairs             Wheelchair Mobility    Modified Rankin (Stroke Patients Only)       Balance Overall balance assessment: Needs assistance;History of Falls Sitting-balance support: No upper extremity supported;Feet supported Sitting balance-Leahy Scale: Good     Standing balance support: No upper extremity supported;During functional activity Standing balance-Leahy Scale: Poor Standing balance comment: Dynamically, required BUE support                            Cognition Arousal/Alertness: Awake/alert Behavior During Therapy: WFL for tasks assessed/performed Overall Cognitive Status: Impaired/Different from baseline Area of Impairment: Safety/judgement                         Safety/Judgement: Decreased awareness of safety            Exercises      General Comments        Pertinent Vitals/Pain Pain Assessment: Faces Faces Pain Scale: Hurts a little bit Pain Location: back Pain Descriptors / Indicators: Aching;Grimacing;Operative site guarding Pain Intervention(s): Limited activity within patient's tolerance;Monitored during session;Repositioned    Home Living                      Prior Function  PT Goals (current goals can now be found in the care plan section) Acute Rehab PT Goals Patient Stated Goal: Rehab and then home PT Goal Formulation: With patient Time For Goal Achievement: 09/26/19 Potential to Achieve Goals: Good Progress towards PT goals: Progressing toward goals    Frequency    Min 5X/week      PT Plan Discharge plan needs to be updated     Co-evaluation              AM-PAC PT "6 Clicks" Mobility   Outcome Measure  Help needed turning from your back to your side while in a flat bed without using bedrails?: A Little Help needed moving from lying on your back to sitting on the side of a flat bed without using bedrails?: A Little Help needed moving to and from a bed to a chair (including a wheelchair)?: A Little Help needed standing up from a chair using your arms (e.g., wheelchair or bedside chair)?: A Little Help needed to walk in hospital room?: A Little Help needed climbing 3-5 steps with a railing? : A Lot 6 Click Score: 17    End of Session Equipment Utilized During Treatment: Back brace Activity Tolerance: Patient tolerated treatment well;Patient limited by pain Patient left: in chair;with call bell/phone within reach Nurse Communication: Mobility status PT Visit Diagnosis: Other abnormalities of gait and mobility (R26.89);Pain Pain - Right/Left: Left Pain - part of body:  (low back )     Time: 2841-3244 PT Time Calculation (min) (ACUTE ONLY): 17 min  Charges:  $Gait Training: 8-22 mins                     [name], PT, DPT Acute Rehabilitation Services Pager: [phone] Office: [phone]    Thelma Comp 09/12/2019, 2:58 PM

## 2019-09-13 DIAGNOSIS — E785 Hyperlipidemia, unspecified: Secondary | ICD-10-CM

## 2019-09-13 DIAGNOSIS — E1169 Type 2 diabetes mellitus with other specified complication: Secondary | ICD-10-CM

## 2019-09-13 LAB — GLUCOSE, CAPILLARY
Glucose-Capillary: 122 mg/dL — ABNORMAL HIGH (ref 70–99)
Glucose-Capillary: 119 mg/dL — ABNORMAL HIGH (ref 70–99)
Glucose-Capillary: 106 mg/dL — ABNORMAL HIGH (ref 70–99)
Glucose-Capillary: 173 mg/dL — ABNORMAL HIGH (ref 70–99)

## 2019-09-13 MED ORDER — POLYETHYLENE GLYCOL 3350 17 G PO PACK
17.0000 g | PACK | Freq: Two times a day (BID) | ORAL | Status: DC
  Filled 2019-09-13 (×6): qty 1

## 2019-09-13 MED ORDER — LACTULOSE 10 GM/15ML PO SOLN: 30.0000 g | Freq: Two times a day (BID) | ORAL | Status: DC | PRN

## 2019-09-13 NOTE — Progress Notes (Signed)
OT Cancellation Note  Patient Details Name: [name] [name] MRN: [id number] DOB: Jan 05, 1941   Cancelled Treatment:    Reason Eval/Treat Not Completed: Patient declined, no reason specified;Other (comment) Pt perseverating on needing to have BM. Offered ambulation or sitting on commode to facilitate movement of bowels with pt declining all options. Will follow up as time allows for OT session.  Lanier Clam., COTA/L Acute Rehabilitation Services [phone]   Ihor Gully 09/13/2019, 2:29 PM

## 2019-09-13 NOTE — Consult Note (Signed)
[name] [name] KKX:[id number] DOB: 07/07/1940 DOA: 09/05/2019 PCP: Unk Pinto, MD  Brief Narrative: 79 year old with a history of HTN, pre-DM, CKD stage II, obesity, L2-3 posterior lumbar fusion 2020, and left hip OA status post ORIF who presented with worsening low back pain and bilateral lower extremity weakness.  MRI of the lumbar spine revealed L3-5 spinal stenosis.  Neurosurgery took patient to the OR and performed a laminectomy and decompression at L3-L5 with a fusion on 09/07/2019.  His postop course has been complicated by acute kidney injury for which the hospitalist team was consulted.  Subjective: Chart reviewed in detail.  No acute medical issues requiring a visit with patient today.  TRH will continue to follow along with you in medical consultation.  Recommendations/Plan:  Acute kidney injury on CKD stage II Baseline creatinine 1.4 -peaked at 2.1 -stable at this time  Essential HTN Continue Norvasc, losartan, metoprolol at current doses  DM 2 A1c 6.6 -CBG presently well controlled  BPH Continue Flomax  Abnormal left prosthetic hip X-ray suggested lucency around the prosthetic hip joint -Ortho consulted and recommended vitamin D with no specific physical therapy limits -CRP negative  Spinal stenosis with early signs of spinal impingement Care as per primary team -status post laminectomy/decompression of lumbar spine with fusion 09/07/2019 -needs CIR placement  Disposition Medically stable for transfer to CIR when bed available/insurance authorization completed  Code Status: FULL Family Communication: no family present at time of exam  DVT prophylaxis: Per primary team  Objective: Blood pressure 128/63, pulse 76, temperature 98.2 F (36.8 C), temperature source Oral, resp. rate 18, height 5' 10.98" (1.803 m), weight 115 kg, SpO2 99 %. No intake or output data in the 24 hours ending 09/13/19 0836   Exam:   Data Reviewed: Basic Metabolic Panel: Recent  Labs  Lab 09/07/19 0411 09/08/19 0505 09/09/19 0429 09/10/19 0413 09/11/19 0424  NA 136 133* 136 132* 132*  K 3.7 5.2* 4.4 4.3 4.0  CL 104 104 105 101 102  CO2 22 19* 21* 22 22  GLUCOSE 148* 170* 138* 140* 174*  BUN 25* 28* 27* 27* 21  CREATININE 1.61* 2.07* 1.63* 1.62* 1.29*  CALCIUM 9.2 8.2* 8.3* 8.6* 8.3*  MG 1.8 1.6* 1.6* 2.1 1.8    CBC: Recent Labs  Lab 09/07/19 0411 09/08/19 0505 09/09/19 0429 09/10/19 0413 09/11/19 0424  WBC 8.3 13.8* 10.1 10.5 8.9  HGB 11.8* 10.7* 10.0* 9.4* 8.7*  HCT 37.6* 34.9* 32.7* 30.5* 27.8*  MCV 83.0 86.0 85.8 84.3 84.5  PLT 324 313 245 233 252    Liver Function Tests: No results for input(s): AST, ALT, ALKPHOS, BILITOT, PROT, ALBUMIN in the last 168 hours. No results for input(s): LIPASE, AMYLASE in the last 168 hours. No results for input(s): AMMONIA in the last 168 hours.  Coags: No results for input(s): INR in the last 168 hours.  Invalid input(s): PT No results for input(s): APTT in the last 168 hours.  Cardiac Enzymes: No results for input(s): CKTOTAL, CKMB, CKMBINDEX, TROPONINI in the last 168 hours.  CBG: Recent Labs  Lab 09/12/19 0620 09/12/19 1128 09/12/19 1604 09/12/19 2116 09/13/19 0626  GLUCAP 118* 173* 127* 152* 122*    Recent Results (from the past 240 hour(s))  SARS Coronavirus 2 by RT PCR (hospital order, performed in [facility] hospital lab) Nasopharyngeal Nasopharyngeal Swab     Status: None   Collection Time: 09/05/19  4:01 AM   Specimen: Nasopharyngeal Swab  Result Value Ref Range Status  SARS Coronavirus 2 NEGATIVE NEGATIVE Final    Comment: (NOTE) SARS-CoV-2 target nucleic acids are NOT DETECTED.  The SARS-CoV-2 RNA is generally detectable in upper and lower respiratory specimens during the acute phase of infection. The lowest concentration of SARS-CoV-2 viral copies this assay can detect is 250 copies / mL. A negative result does not preclude SARS-CoV-2 infection and should not be used as  the sole basis for treatment or other patient management decisions.  A negative result may occur with improper specimen collection / handling, submission of specimen other than nasopharyngeal swab, presence of viral mutation(s) within the areas targeted by this assay, and inadequate number of viral copies (<250 copies / mL). A negative result must be combined with clinical observations, patient history, and epidemiological information.  Fact Sheet for Patients:   StrictlyIdeas.no  Fact Sheet for Healthcare Providers: BankingDealers.co.za  This test is not yet approved or  cleared by the [place] FDA and has been authorized for detection and/or diagnosis of SARS-CoV-2 by FDA under an Emergency Use Authorization (EUA).  This EUA will remain in effect (meaning this test can be used) for the duration of the COVID-19 declaration under Section 564(b)(1) of the Act, 21 U.S.C. section 360bbb-3(b)(1), unless the authorization is terminated or revoked sooner.  Performed at [facility], [place] [address] [place], [place] [address]   Surgical PCR screen     Status: Abnormal   Collection Time: 09/07/19  5:57 AM   Specimen: Nasal Mucosa; Nasal Swab  Result Value Ref Range Status   MRSA, PCR NEGATIVE NEGATIVE Final   Staphylococcus aureus POSITIVE (A) NEGATIVE Final    Comment: (NOTE) The Xpert SA Assay (FDA approved for NASAL specimens in patients 47 years of age and older), is one component of a comprehensive surveillance program. It is not intended to diagnose infection nor to guide or monitor treatment. Performed at [facility], [place] [address] [place], [place] [address]      Studies:   Recent x-ray studies have been reviewed in detail by the Attending Physician  Scheduled Meds:  Scheduled Meds: . amLODipine  5 mg Oral Daily  . docusate sodium  100 mg Oral BID  . fenofibrate  160 mg Oral Daily  .  finasteride  5 mg Oral Daily  . insulin aspart  0-5 Units Subcutaneous QHS  . insulin aspart  0-9 Units Subcutaneous TID WC  . losartan  100 mg Oral Daily  . magnesium oxide  200 mg Oral BID  . meloxicam  7.5-15 mg Oral Daily  . metoprolol tartrate  12.5 mg Oral BID  . pantoprazole  40 mg Oral BID  . senna  1 tablet Oral BID  . sodium chloride flush  3 mL Intravenous Q12H  . tamsulosin  0.4 mg Oral QPC breakfast     Cherene Altes , MD   [facility] Office  [phone]  09/13/2019, 8:36 AM   LOS: 6 days

## 2019-09-13 NOTE — Progress Notes (Signed)
Patient ID: [name] [name], male   DOB: Oct 19, 1940, 79 y.o.   MRN: [id number] Lower extremity strength is unchanged Patient has had a shower and that has made him feel better Bowels are still locked down despite laxatives Encourage ambulation Await decision regarding rehab In the meantime continue his PT and OT in the hospital

## 2019-09-14 DIAGNOSIS — N1832 Chronic kidney disease, stage 3b: Secondary | ICD-10-CM

## 2019-09-14 LAB — GLUCOSE, CAPILLARY
Glucose-Capillary: 124 mg/dL — ABNORMAL HIGH (ref 70–99)
Glucose-Capillary: 139 mg/dL — ABNORMAL HIGH (ref 70–99)
Glucose-Capillary: 120 mg/dL — ABNORMAL HIGH (ref 70–99)
Glucose-Capillary: 142 mg/dL — ABNORMAL HIGH (ref 70–99)

## 2019-09-14 NOTE — TOC Progression Note (Signed)
Transition of Care [facility]) - Progression Note    Patient Details  Name: [name] [name] MRN: [id number] Date of Birth: 1940-03-19  Transition of Care [facility] CM/SW [place], [place] Phone Number: 09/14/2019, 11:37 AM  Clinical Narrative:    CSW met with patient to discuss CIR back-up options, in case [employer] gives a denial. Patient agreeable to SNF, but wants one with good therapy and good food. Patient asked for recommendations, but CSW unable to make recommendations. Patient asked about [facility] as it was close to his home. CSW sent referral and they will review. CSW to follow.    Expected Discharge Plan: [place] Barriers to Discharge: Continued Medical Work up, Ship broker  Expected Discharge Plan and Services Expected Discharge Plan: [place] Choice: [place] arrangements for the past 2 months: Single Family Home                                       Social Determinants of Health (SDOH) Interventions    Readmission Risk Interventions No flowsheet data found.

## 2019-09-14 NOTE — Consult Note (Signed)
[name] [name] VQM:[id number] DOB: [birth year] DOA: 09/05/2019 PCP: Unk Pinto, MD  Brief Narrative: 79 year old with a history of HTN, pre-DM, CKD stage II, obesity, L2-3 posterior lumbar fusion 2020, and left hip OA status post ORIF who presented with worsening low back pain and bilateral lower extremity weakness.  MRI of the lumbar spine revealed L3-5 spinal stenosis.  Neurosurgery took patient to the OR and performed a laminectomy and decompression at L3-L5 with a fusion on 09/07/2019.  His postop course has been complicated by acute kidney injury for which the hospitalist team was consulted.  Subjective: Chart reviewed in detail.  No acute medical issues requiring a visit with patient today.  TRH will continue to follow along with you in medical consultation.  Recommendations/Plan:  Acute kidney injury on CKD stage II Baseline creatinine 1.4 - peaked at 2.1   Essential HTN Continue Norvasc, losartan, metoprolol at current doses  DM 2 A1c 6.6 - CBG presently controlled  BPH Continue Flomax  Abnormal left prosthetic hip X-ray suggested lucency around the prosthetic hip joint - Ortho consulted and recommended vitamin D with no specific physical therapy limits - CRP negative - he has a long complicated history regarding this hip, including a final successful revision per Dr. Mayer Camel  Spinal stenosis with early signs of spinal impingement Care as per primary team -status post laminectomy/decompression of lumbar spine with fusion 09/07/2019 -needs CIR placement  Disposition Medically stable for transfer to CIR when bed available/insurance authorization completed  Code Status: FULL Family Communication:   DVT prophylaxis: Per primary team  Objective: Blood pressure (!) 150/66, pulse 65, temperature 98.7 F (37.1 C), temperature source Oral, resp. rate 15, height 5' 10.98" (1.803 m), weight 115 kg, SpO2 100 %. No intake or output data in the 24 hours ending 09/14/19  0824   Exam:   Data Reviewed: Basic Metabolic Panel: Recent Labs  Lab 09/08/19 0505 09/09/19 0429 09/10/19 0413 09/11/19 0424  NA 133* 136 132* 132*  K 5.2* 4.4 4.3 4.0  CL 104 105 101 102  CO2 19* 21* 22 22  GLUCOSE 170* 138* 140* 174*  BUN 28* 27* 27* 21  CREATININE 2.07* 1.63* 1.62* 1.29*  CALCIUM 8.2* 8.3* 8.6* 8.3*  MG 1.6* 1.6* 2.1 1.8    CBC: Recent Labs  Lab 09/08/19 0505 09/09/19 0429 09/10/19 0413 09/11/19 0424  WBC 13.8* 10.1 10.5 8.9  HGB 10.7* 10.0* 9.4* 8.7*  HCT 34.9* 32.7* 30.5* 27.8*  MCV 86.0 85.8 84.3 84.5  PLT 313 245 233 252    Liver Function Tests: No results for input(s): AST, ALT, ALKPHOS, BILITOT, PROT, ALBUMIN in the last 168 hours. No results for input(s): LIPASE, AMYLASE in the last 168 hours. No results for input(s): AMMONIA in the last 168 hours.  Coags: No results for input(s): INR in the last 168 hours.  Invalid input(s): PT No results for input(s): APTT in the last 168 hours.  Cardiac Enzymes: No results for input(s): CKTOTAL, CKMB, CKMBINDEX, TROPONINI in the last 168 hours.  CBG: Recent Labs  Lab 09/13/19 0626 09/13/19 1152 09/13/19 1623 09/13/19 2124 09/14/19 0621  GLUCAP 122* 119* 106* 173* 124*    Recent Results (from the past 240 hour(s))  SARS Coronavirus 2 by RT PCR (hospital order, performed in [facility] hospital lab) Nasopharyngeal Nasopharyngeal Swab     Status: None   Collection Time: 09/05/19  4:01 AM   Specimen: Nasopharyngeal Swab  Result Value Ref Range Status   SARS Coronavirus 2 NEGATIVE  NEGATIVE Final    Comment: (NOTE) SARS-CoV-2 target nucleic acids are NOT DETECTED.  The SARS-CoV-2 RNA is generally detectable in upper and lower respiratory specimens during the acute phase of infection. The lowest concentration of SARS-CoV-2 viral copies this assay can detect is 250 copies / mL. A negative result does not preclude SARS-CoV-2 infection and should not be used as the sole basis for  treatment or other patient management decisions.  A negative result may occur with improper specimen collection / handling, submission of specimen other than nasopharyngeal swab, presence of viral mutation(s) within the areas targeted by this assay, and inadequate number of viral copies (<250 copies / mL). A negative result must be combined with clinical observations, patient history, and epidemiological information.  Fact Sheet for Patients:   StrictlyIdeas.no  Fact Sheet for Healthcare Providers: BankingDealers.co.za  This test is not yet approved or  cleared by the [place] FDA and has been authorized for detection and/or diagnosis of SARS-CoV-2 by FDA under an Emergency Use Authorization (EUA).  This EUA will remain in effect (meaning this test can be used) for the duration of the COVID-19 declaration under Section 564(b)(1) of the Act, 21 U.S.C. section 360bbb-3(b)(1), unless the authorization is terminated or revoked sooner.  Performed at [facility], [place] [address] [place], [place] [address]   Surgical PCR screen     Status: Abnormal   Collection Time: 09/07/19  5:57 AM   Specimen: Nasal Mucosa; Nasal Swab  Result Value Ref Range Status   MRSA, PCR NEGATIVE NEGATIVE Final   Staphylococcus aureus POSITIVE (A) NEGATIVE Final    Comment: (NOTE) The Xpert SA Assay (FDA approved for NASAL specimens in patients 63 years of age and older), is one component of a comprehensive surveillance program. It is not intended to diagnose infection nor to guide or monitor treatment. Performed at [facility], [place] [address], [place], [place] [address]      Studies:   Recent x-ray studies have been reviewed in detail by the Attending Physician  Scheduled Meds:  Scheduled Meds: . amLODipine  5 mg Oral Daily  . docusate sodium  100 mg Oral BID  . fenofibrate  160 mg Oral Daily  . finasteride  5 mg Oral  Daily  . insulin aspart  0-5 Units Subcutaneous QHS  . insulin aspart  0-9 Units Subcutaneous TID WC  . losartan  100 mg Oral Daily  . magnesium oxide  200 mg Oral BID  . meloxicam  7.5-15 mg Oral Daily  . metoprolol tartrate  12.5 mg Oral BID  . pantoprazole  40 mg Oral BID  . polyethylene glycol  17 g Oral BID  . senna  1 tablet Oral BID  . sodium chloride flush  3 mL Intravenous Q12H  . tamsulosin  0.4 mg Oral QPC breakfast     Cherene Altes , MD   [facility] Office  [phone]  09/14/2019, 8:24 AM   LOS: 7 days

## 2019-09-14 NOTE — Progress Notes (Signed)
Physical Therapy Treatment Patient Details Name: [name] [name] MRN: [id number] DOB: Jun 18, 1940 Today's Date: 09/14/2019    History of Present Illness pt is a 79 y/o male with h/o HTN, L hip ORIF, THA then revision, L23 fusion 04/2018, admitted with neurogenic claudication and radiculopathy.  Pt s/p lami and decompression of L34 and L45 with PLIF, PLA, spacers and bone grafting.    PT Comments    Pt progressing well with post-op mobility. He was able to demonstrate transfers and ambulation with up to min assist and RW for support. Chair follow continues to be required due to sudden need to sit. Noted LLE buckling at ~75' before seated rest break. Education was reinforced for precautions, brace application/wearing schedule, and appropriate activity progression. Will continue to follow.      Follow Up Recommendations  CIR     Equipment Recommendations  None recommended by PT    Recommendations for Other Services       Precautions / Restrictions Precautions Precautions: Back;Fall Precaution Booklet Issued: Yes (comment) Precaution Comments: Reviewed precautions during functional mobility Required Braces or Orthoses: Spinal Brace Spinal Brace: Lumbar corset;Applied in sitting position Restrictions Weight Bearing Restrictions: No    Mobility  Bed Mobility               General bed mobility comments: Pt was sitting in chair when arrived in room  Transfers Overall transfer level: Needs assistance Equipment used: Rolling walker (2 wheeled) Transfers: Sit to/from Stand Sit to Stand: Supervision         General transfer comment: pt was able to transition well, pt hurried into standing before walker was set in place  Ambulation/Gait Ambulation/Gait assistance: Min assist Gait Distance (Feet): 150 Feet Assistive device: Rolling walker (2 wheeled) Gait Pattern/deviations: Step-through pattern;Step-to pattern;Decreased dorsiflexion - left;Decreased dorsiflexion -  right;Decreased stance time - left Gait velocity: decreased Gait velocity interpretation: <1.31 ft/sec, indicative of household ambulator General Gait Details: education on proper ambulation with walker, pt was able to self correct. Pt demonstrated antalgic gait after 75 feet of walking due to left hip pain.    Stairs             Wheelchair Mobility    Modified Rankin (Stroke Patients Only)       Balance Overall balance assessment: Needs assistance;History of Falls Sitting-balance support: No upper extremity supported;Feet supported Sitting balance-Leahy Scale: Good     Standing balance support: No upper extremity supported;During functional activity Standing balance-Leahy Scale: Poor Standing balance comment: Dynamically, required BUE support                            Cognition Arousal/Alertness: Awake/alert Behavior During Therapy: WFL for tasks assessed/performed Overall Cognitive Status: Within Functional Limits for tasks assessed                                        Exercises      General Comments        Pertinent Vitals/Pain Pain Assessment: Faces Faces Pain Scale: Hurts a little bit Pain Location: back Pain Descriptors / Indicators: Aching;Grimacing;Operative site guarding Pain Intervention(s): Limited activity within patient's tolerance;Monitored during session;Repositioned    Home Living                      Prior Function  PT Goals (current goals can now be found in the care plan section) Acute Rehab PT Goals Patient Stated Goal: Get to inpatient rehab PT Goal Formulation: With patient Time For Goal Achievement: 09/26/19 Potential to Achieve Goals: Good Progress towards PT goals: Progressing toward goals    Frequency    Min 5X/week      PT Plan Current plan remains appropriate    Co-evaluation              AM-PAC PT "6 Clicks" Mobility   Outcome Measure  Help needed  turning from your back to your side while in a flat bed without using bedrails?: None Help needed moving from lying on your back to sitting on the side of a flat bed without using bedrails?: A Little Help needed moving to and from a bed to a chair (including a wheelchair)?: A Little Help needed standing up from a chair using your arms (e.g., wheelchair or bedside chair)?: A Little Help needed to walk in hospital room?: A Little Help needed climbing 3-5 steps with a railing? : A Lot 6 Click Score: 18    End of Session Equipment Utilized During Treatment: Back brace Activity Tolerance: Patient tolerated treatment well;Patient limited by pain Patient left: in chair;with call bell/phone within reach Nurse Communication: Mobility status PT Visit Diagnosis: Unsteadiness on feet (R26.81);Difficulty in walking, not elsewhere classified (R26.2)     Time: 5465-6812 PT Time Calculation (min) (ACUTE ONLY): 20 min  Charges:  $Gait Training: 8-22 mins                     [name], PT, DPT Acute Rehabilitation Services Pager: [phone] Office: [phone]    Thelma Comp 09/14/2019, 2:58 PM

## 2019-09-14 NOTE — NC FL2 (Signed)
[place] LEVEL OF CARE SCREENING TOOL     IDENTIFICATION  Patient Name: [name] [name] Birthdate: 01/30/1941 Sex: male Admission Date (Current Location): 09/05/2019  [facility] and [place] Number:  Herbalist and Address:  [facility]. [facility], [place] [address] [place], [place] [address]      Provider Number: [id number]  Attending Physician Name and Address:  Kristeen Miss, MD  Relative Name and Phone Number:       Current Level of Care: Hospital Recommended Level of Care: [place] Prior Approval Number:    Date Approved/Denied:   PASRR Number: [id number]  Discharge Plan: SNF    Current Diagnoses: Patient Active Problem List   Diagnosis Date Noted  . Chronic congestive heart failure, unspecified heart failure type ([place]) 09/11/2019  . Hyperlipidemia associated with type 2 diabetes mellitus ([place]) 09/11/2019  . Spinal stenosis 09/05/2019  . Abnormal glucose 08/11/2019  . Herniated nucleus pulposus, L2-3 left 11/09/2017  . Normocytic anemia 05/02/2017  . Vitamin B12 deficiency 05/02/2017  . OA (osteoarthritis) of hip 06/24/2016  . CKD (chronic kidney disease) stage 3, GFR 30-59 ml/min (HCC) 03/25/2015  . Snoring 03/25/2015  . Encounter for Medicare annual wellness exam 11/27/2014  . Hyperparathyroidism, primary ([place]) 07/09/2014  . Vitamin D deficiency 06/13/2013  . Medication management 06/13/2013  . Hyperlipidemia   . Hypertension   . GERD (gastroesophageal reflux disease)   . Kidney stones   . Morbidly obese ([place])   . Stricture and stenosis of esophagus 10/18/2008    Orientation RESPIRATION BLADDER Height & Weight     Self, Time, Situation, Place  Normal Continent Weight: 253 lb 8.5 oz (115 kg) Height:  5' 10.98" (180.3 cm)  BEHAVIORAL SYMPTOMS/MOOD NEUROLOGICAL BOWEL NUTRITION STATUS      Continent Diet (regular)  AMBULATORY STATUS COMMUNICATION OF NEEDS Skin   Limited Assist Verbally Surgical wounds  (closed back, ABD and gauze dressing)                       Personal Care Assistance Level of Assistance  Bathing, Feeding, Dressing Bathing Assistance: Limited assistance Feeding assistance: Independent Dressing Assistance: Limited assistance     Functional Limitations Info  Sight Sight Info: Impaired (wears glasses)        SPECIAL CARE FACTORS FREQUENCY  PT (By licensed PT), OT (By licensed OT)     PT Frequency: 5x/wk OT Frequency: 5x/wk            Contractures Contractures Info: Not present    Additional Factors Info  Code Status, Allergies, Insulin Sliding Scale Code Status Info: Full Allergies Info: Iodinated Diagnostic Agents, Iohexol   Insulin Sliding Scale Info: 0-9 units 3x/day with meals       Current Medications (09/14/2019):  This is the current hospital active medication list Current Facility-Administered Medications  Medication Dose Route Frequency Provider Last Rate Last Admin  . 0.9 %  sodium chloride infusion  250 mL Intravenous Continuous Kristeen Miss, MD 1 mL/hr at 09/08/19 0103 250 mL at 09/08/19 0103  . acetaminophen (TYLENOL) tablet 650 mg  650 mg Oral Q4H PRN Kristeen Miss, MD      . alum & mag hydroxide-simeth (MAALOX/MYLANTA) 200-200-20 MG/5ML suspension 30 mL  30 mL Oral Q6H PRN Kristeen Miss, MD      . amLODipine (NORVASC) tablet 5 mg  5 mg Oral Daily Kristeen Miss, MD   5 mg at 09/14/19 1006  . bisacodyl (DULCOLAX) suppository 10  mg  10 mg Rectal Daily PRN Kristeen Miss, MD   10 mg at 09/13/19 1324  . docusate sodium (COLACE) capsule 100 mg  100 mg Oral BID Kristeen Miss, MD   100 mg at 09/14/19 1005  . fenofibrate tablet 160 mg  160 mg Oral Daily Kristeen Miss, MD   160 mg at 09/14/19 1005  . finasteride (PROSCAR) tablet 5 mg  5 mg Oral Daily Kristeen Miss, MD   5 mg at 09/14/19 1005  . HYDROmorphone (DILAUDID) injection 0.5-1 mg  0.5-1 mg Intravenous Q2H PRN Kristeen Miss, MD   1 mg at 09/08/19 2059  . hyoscyamine (LEVSIN SL) SL  tablet 0.125 mg  0.125 mg Sublingual Daily PRN Kristeen Miss, MD      . insulin aspart (novoLOG) injection 0-5 Units  0-5 Units Subcutaneous QHS Kristeen Miss, MD      . insulin aspart (novoLOG) injection 0-9 Units  0-9 Units Subcutaneous TID WC Kristeen Miss, MD   1 Units at 09/14/19 0650  . lactated ringers infusion   Intravenous Continuous Kristeen Miss, MD 125 mL/hr at 09/08/19 0210 New Bag at 09/08/19 0210  . lactulose (CHRONULAC) 10 GM/15ML solution 30 g  30 g Oral BID PRN Cherene Altes, MD      . losartan (COZAAR) tablet 100 mg  100 mg Oral Daily Kristeen Miss, MD   100 mg at 09/14/19 1005  . magnesium citrate solution 1 Bottle  1 Bottle Oral Daily PRN Kristeen Miss, MD   1 Bottle at 09/13/19 [phone]  . magnesium oxide (MAG-OX) tablet 200 mg  200 mg Oral BID Kristeen Miss, MD   200 mg at 09/14/19 1005  . meloxicam (MOBIC) tablet 7.5-15 mg  7.5-15 mg Oral Daily Amin, Ankit Chirag, MD   15 mg at 09/14/19 1005  . menthol-cetylpyridinium (CEPACOL) lozenge 3 mg  1 lozenge Oral PRN Kristeen Miss, MD       Or  . phenol (CHLORASEPTIC) mouth spray 1 spray  1 spray Mouth/Throat PRN Kristeen Miss, MD      . methocarbamol (ROBAXIN) tablet 500 mg  500 mg Oral Q6H PRN Kristeen Miss, MD   500 mg at 09/09/19 0350   Or  . methocarbamol (ROBAXIN) 500 mg in dextrose 5 % 50 mL IVPB  500 mg Intravenous Q6H PRN Kristeen Miss, MD 100 mL/hr at 09/08/19 0205 500 mg at 09/08/19 0205  . metoprolol tartrate (LOPRESSOR) tablet 12.5 mg  12.5 mg Oral BID Kristeen Miss, MD   12.5 mg at 09/14/19 1006  . morphine 2 MG/ML injection 2-4 mg  2-4 mg Intravenous Q2H PRN Kristeen Miss, MD      . ondansetron (ZOFRAN) tablet 4 mg  4 mg Oral Q6H PRN Kristeen Miss, MD       Or  . ondansetron (ZOFRAN) injection 4 mg  4 mg Intravenous Q6H PRN Kristeen Miss, MD      . oxyCODONE-acetaminophen (PERCOCET/ROXICET) 5-325 MG per tablet 1-2 tablet  1-2 tablet Oral Q3H PRN Kristeen Miss, MD   2 tablet at 09/14/19 [phone]  . pantoprazole  (PROTONIX) EC tablet 40 mg  40 mg Oral BID Kristeen Miss, MD   40 mg at 09/14/19 1006  . polyethylene glycol (MIRALAX / GLYCOLAX) packet 17 g  17 g Oral BID Joette Catching T, MD      . polyvinyl alcohol (LIQUIFILM TEARS) 1.4 % ophthalmic solution 1 drop  1 drop Both Eyes PRN Kristeen Miss, MD      . senna (SENOKOT) tablet 8.6  mg  1 tablet Oral BID Kristeen Miss, MD   8.6 mg at 09/14/19 1006  . sodium chloride flush (NS) 0.9 % injection 3 mL  3 mL Intravenous Austin Miles, MD   3 mL at 09/13/19 2122  . sodium chloride flush (NS) 0.9 % injection 3 mL  3 mL Intravenous PRN Kristeen Miss, MD      . sodium phosphate (FLEET) 7-19 GM/118ML enema 1 enema  1 enema Rectal Once PRN Kristeen Miss, MD      . tamsulosin (FLOMAX) capsule 0.4 mg  0.4 mg Oral QPC breakfast Kristeen Miss, MD   0.4 mg at 09/14/19 1005     Discharge Medications: Please see discharge summary for a list of discharge medications.  Relevant Imaging Results:  Relevant Lab Results:   Additional Information SS#: [id number]  Geralynn Ochs, LCSW

## 2019-09-15 LAB — BASIC METABOLIC PANEL
Anion gap: 11 (ref 5–15)
BUN: 13 mg/dL (ref 8–23)
CO2: 23 mmol/L (ref 22–32)
Calcium: 9.1 mg/dL (ref 8.9–10.3)
Chloride: 100 mmol/L (ref 98–111)
Creatinine, Ser: 1.35 mg/dL — ABNORMAL HIGH (ref 0.61–1.24)
GFR calc Af Amer: 59 mL/min — ABNORMAL LOW (ref >60)
GFR calc non Af Amer: 51 mL/min — ABNORMAL LOW (ref >60)
Glucose, Bld: 134 mg/dL — ABNORMAL HIGH (ref 70–99)
Potassium: 4.4 mmol/L (ref 3.5–5.1)
Sodium: 134 mmol/L — ABNORMAL LOW (ref 135–145)

## 2019-09-15 LAB — CBC
HCT: 34.5 % — ABNORMAL LOW (ref 39.0–52.0)
Hemoglobin: 10.8 g/dL — ABNORMAL LOW (ref 13.0–17.0)
MCH: 26.7 pg (ref 26.0–34.0)
MCHC: 31.3 g/dL (ref 30.0–36.0)
MCV: 85.4 fL (ref 80.0–100.0)
Platelets: 470 10*3/uL — ABNORMAL HIGH (ref 150–400)
RBC: 4.04 MIL/uL — ABNORMAL LOW (ref 4.22–5.81)
RDW: 15.8 % — ABNORMAL HIGH (ref 11.5–15.5)
WBC: 8.3 10*3/uL (ref 4.0–10.5)
nRBC: 0 % (ref 0.0–0.2)

## 2019-09-15 LAB — GLUCOSE, CAPILLARY
Glucose-Capillary: 138 mg/dL — ABNORMAL HIGH (ref 70–99)
Glucose-Capillary: 120 mg/dL — ABNORMAL HIGH (ref 70–99)
Glucose-Capillary: 112 mg/dL — ABNORMAL HIGH (ref 70–99)
Glucose-Capillary: 147 mg/dL — ABNORMAL HIGH (ref 70–99)

## 2019-09-15 LAB — MAGNESIUM: Magnesium: 1.9 mg/dL (ref 1.7–2.4)

## 2019-09-15 MED ORDER — VITAMIN B-12 1000 MCG PO TABS
1000.0000 ug | ORAL_TABLET | Freq: Every day | ORAL | Status: DC
  Administered 2019-09-15 – 2019-09-18 (×4): 1000 ug via ORAL
  Filled 2019-09-15 (×4): qty 1

## 2019-09-15 NOTE — Consult Note (Signed)
[name] [name] VOZ:[id number] DOB: 08-13-1940 DOA: 09/05/2019 PCP: Unk Pinto, MD  Brief Narrative: 79 year old with a history of HTN, pre-DM, CKD stage II, obesity, L2-3 posterior lumbar fusion 2020, and left hip OA status post ORIF who presented with worsening low back pain and bilateral lower extremity weakness.  MRI of the lumbar spine revealed L3-5 spinal stenosis.  Neurosurgery took patient to the OR and performed a laminectomy and decompression at L3-L5 with a fusion on 09/07/2019.  His postop course has been complicated by acute kidney injury for which the hospitalist team was consulted.  Subjective: F/U BMET, CBC, and Mg odered for today but not available for unclear reasons.   Chart reviewed.  No acute medical issues requiring a visit with patient today.  TR[facility] will continue to follow along with you in medical consultation.  Recommendations/Plan:  Spinal stenosis with early signs of spinal impingement Care as per primary team - status post laminectomy/decompression of lumbar spine with fusion 09/07/2019 - needs CIR placement  Acute kidney injury on CKD stage II Baseline creatinine 1.4 - peaked at 2.1   Essential HTN Continue Norvasc, losartan, metoprolol at current doses  DM 2 A1c 6.6 - CBG presently controlled  BPH Continue Flomax  Abnormal left prosthetic hip X-ray suggested lucency around the prosthetic hip joint - Ortho consulted and recommended vitamin D with no specific physical therapy limits - CRP negative - he has a long complicated history regarding this hip, including a final successful revision per Dr. Mayer Camel  Disposition Medically stable for transfer to CIR when bed available/insurance authorization completed  Code Status: FULL  DVT prophylaxis: Per primary team  Objective: Blood pressure (!) 133/93, pulse 81, temperature 97.8 F (36.6 C), resp. rate 18, height 5' 10.98" (1.803 m), weight 115 kg, SpO2 100 %. No intake or output data in the 24 hours  ending 09/15/19 0755   Exam: No exam today  Data Reviewed: Basic Metabolic Panel: Recent Labs  Lab 09/09/19 0429 09/10/19 0413 09/11/19 0424  NA 136 132* 132*  K 4.4 4.3 4.0  CL 105 101 102  CO2 21* 22 22  GLUCOSE 138* 140* 174*  BUN 27* 27* 21  CREATININE 1.63* 1.62* 1.29*  CALCIUM 8.3* 8.6* 8.3*  MG 1.6* 2.1 1.8    CBC: Recent Labs  Lab 09/09/19 0429 09/10/19 0413 09/11/19 0424  WBC 10.1 10.5 8.9  HGB 10.0* 9.4* 8.7*  HCT 32.7* 30.5* 27.8*  MCV 85.8 84.3 84.5  PLT 245 233 252    Liver Function Tests: No results for input(s): AST, ALT, ALKPHOS, BILITOT, PROT, ALBUMIN in the last 168 hours. No results for input(s): LIPASE, AMYLASE in the last 168 hours. No results for input(s): AMMONIA in the last 168 hours.  Coags: No results for input(s): INR in the last 168 hours.  Invalid input(s): PT No results for input(s): APTT in the last 168 hours.  Cardiac Enzymes: No results for input(s): CKTOTAL, CKMB, CKMBINDEX, TROPONINI in the last 168 hours.  CBG: Recent Labs  Lab 09/14/19 0621 09/14/19 1133 09/14/19 1712 09/14/19 2116 09/15/19 0645  GLUCAP 124* 139* 120* 142* 138*    Recent Results (from the past 240 hour(s))  Surgical PCR screen     Status: Abnormal   Collection Time: 09/07/19  5:57 AM   Specimen: Nasal Mucosa; Nasal Swab  Result Value Ref Range Status   MRSA, PCR NEGATIVE NEGATIVE Final   Staphylococcus aureus POSITIVE (A) NEGATIVE Final    Comment: (NOTE) The Xpert SA Assay (  FDA approved for NASAL specimens in patients 35 years of age and older), is one component of a comprehensive surveillance program. It is not intended to diagnose infection nor to guide or monitor treatment. Performed at [facility], [place] [address] [place],  [address]      Studies:   Recent x-ray studies have been reviewed in detail by the Attending Physician  Scheduled Meds:  Scheduled Meds: . amLODipine  5 mg Oral Daily  . docusate sodium   100 mg Oral BID  . fenofibrate  160 mg Oral Daily  . finasteride  5 mg Oral Daily  . insulin aspart  0-5 Units Subcutaneous QHS  . insulin aspart  0-9 Units Subcutaneous TID WC  . losartan  100 mg Oral Daily  . magnesium oxide  200 mg Oral BID  . meloxicam  7.5-15 mg Oral Daily  . metoprolol tartrate  12.5 mg Oral BID  . pantoprazole  40 mg Oral BID  . polyethylene glycol  17 g Oral BID  . senna  1 tablet Oral BID  . tamsulosin  0.4 mg Oral QPC breakfast    Cherene Altes , MD   [facility] Office  [phone]  09/15/2019, 7:55 AM   LOS: 8 days

## 2019-09-15 NOTE — Progress Notes (Signed)
Patient ID: [name] [name], male   DOB: 1940/03/28, 79 y.o.   MRN: [id number] Vital signs are stable Patient's wound had some recurrent drainage this morning Currently it is dry but he has a slightly bloody gauze and ABD on his back. We will treat this locally with dressing changes and painting with Betadine swab stick. This should not keep him from going to rehab if bed is available We will write tentative discharge summary

## 2019-09-15 NOTE — Progress Notes (Signed)
Inpatient Rehabilitation Admissions Coordinator  [facility] has denied CIR admit tonight. I have spoken with patient by phone and he is aware. He is requesting short term SNF. We will sign off at this time.  Danne Baxter, RN, MSN Rehab Admissions Coordinator [phone] 09/15/2019 9:42 PM

## 2019-09-15 NOTE — Progress Notes (Signed)
At approximately 6:15 Am, pt called out for staff help to get OOB and into his chair; less than 5 minutes later, pt's bathroom alarm began to ring, and pt was found in bathroom with a trail of blood on the floor from the bed to his rest room. Pt was quite agitated,and when approached by charge nurse, pt states, "don't send that bastard in to my room any more!" Referring to the nurse tech. He states that he had been waiting for someone to come to his room for over 15 minutes, and no one came, so he went to the bathroom on his own. Pt informed by charge nurse that it had not been 15 minutes, but actually 5 minutes, and asked why pt was so upset at the nurse tech. Pt responded that the tech was "trying to make me move my own legs" instead of moving them for the pt. Pt was reminded that this was part of his therapy. Pt became more irate, stating, "I have two bad hips"! Pt reminded that he was able to move his hips to go into the bathroom just now. Pt became defensive, saying, "I'm not retarded"; staff assured him that no one was saying this, attempting to clarify situation and de-escalate the situation. Blood cleaned from pt and floor at this time.   In an attempt to diffuse pt's agitation, pt was asked if RN could see his incision; pt agreed and allowed primary RN to see incision; gauze and ABD pad applied to lower portion of incision; currently dressing remains C&D. Primary RN allowed pt to verbalize his frustrations, then reminded pt that getting upset with any staff member was not a good reason to undo all the good his surgery had done. Pt was in agreement, and was much calmer by this time. Pt encouraged to continue to call, as getting up without assistance could result in another fall and potentially far worse outcome; pt again verbalized agreement/understanding. Pt currently sitting in chair, gauze and ABD pad intact. Pt verbalized he would call if he needs assistance getting up.

## 2019-09-15 NOTE — Discharge Summary (Signed)
Physician Discharge Summary  Patient ID: [name] [name] MRN: [id number] DOB/AGE: 1941-02-03 79 y.o.  Admit date: 09/05/2019 Discharge date: 09/18/2019  Admission Diagnoses: Lumbar spinal stenosis L3-4 and L4-5 with paraparesis. History of decompression L2-3. History of left hip arthroplasty with multiple revisions.  Discharge Diagnoses: Lumbar spinal stenosis L3-4 and L4-5 with paraparesis. History of decompression L2-3 history of left hip arthroplasty with multiple revisions. Active Problems:   Stricture and stenosis of esophagus   Hyperlipidemia   Hypertension   GERD (gastroesophageal reflux disease)   CKD (chronic kidney disease) stage 3, GFR 30-59 ml/min (HCC)   Spinal stenosis   Chronic congestive heart failure, unspecified heart failure type ([place])   Hyperlipidemia associated with type 2 diabetes mellitus [facility])   Discharged Condition: fair  Hospital Course: Patient was admitted because he could not walk at home and was having significant weakness in his lower extremities. An MRI demonstrated presence of severe spondylitic stenosis at L3-4 and L4-5. He had had multiple hip arthroplasties on the left side with revisions and was concerned that the arthroplasty may be unstable. He was evaluated by Dr. Frederik Pear who noted that his arthroplasty is stable. He underwent surgery to decompress and stabilize L3-4 and L4-5. He tolerated surgery well. He has had persistent weakness in his lower extremities and is having difficulty with independent ambulation. His incision has had some drainage from the lower part of it but this is being treated locally. He is a candidate for comprehensive inpatient rehabilitation.  Consults: rehabilitation medicine  Significant Diagnostic Studies: None  Treatments: surgery: Posterior decompression L3-4 and L4-5 with segmental fixation L2-L5. Posterior lateral arthrodesis with autograft and allograft.  Discharge Exam: Blood pressure 103/63, pulse (!) 58,  temperature 98.2 F (36.8 C), temperature source Oral, resp. rate 20, height 5' 10.98" (1.803 m), weight 115 kg, SpO2 98 %. Incision demonstrates some slight bloody drainage from the lower portion of the incision but is not erythematous. Dry gauze dressings are being performed.  Disposition: Discharge disposition: 03-Skilled Nursing Facility       Discharge Instructions    Diet - low sodium heart healthy   Complete by: As directed    Discharge wound care:   Complete by: As directed    If there is drainage from the incision.  Place dry gauze dressing over incision.  Paint incision with Betadine.  Change dressing daily.  If dressing stays dry for 3 days may discontinue dressing.  Patient may shower without dressing on.   Incentive spirometry RT   Complete by: As directed    Increase activity slowly   Complete by: As directed      Allergies as of 09/18/2019      Reactions   Iodinated Diagnostic Agents Shortness Of Breath   Iohexol Shortness Of Breath      Medication List    TAKE these medications   aspirin EC 81 MG tablet Take 81 mg by mouth daily.   b complex vitamins tablet Take 1 tablet by mouth daily.   bisoprolol-hydrochlorothiazide 5-6.25 MG tablet Commonly known as: ZIAC Take 1 tablet Daily for BP What changed:   how much to take  how to take this  when to take this   fenofibrate 145 MG tablet Commonly known as: TRICOR Take 1 tablet Daily for Triglycerides (Blood Fats) What changed:   how much to take  how to take this  when to take this  additional instructions   finasteride 5 MG tablet Commonly known as: PROSCAR Take 5  mg by mouth daily.   Fish Oil 1200 MG Caps Take 1,200 mg by mouth 2 (two) times daily.   FLAX SEEDS PO Take 1,000 mg by mouth daily.   losartan 100 MG tablet Commonly known as: COZAAR TAKE 1 TABLET BY MOUTH DAILY FOR BLOOD PRESSURE What changed:   how much to take  how to take this  when to take this  additional  instructions   Magnesium Oxide 250 MG Tabs Take 250 mg by mouth 2 (two) times daily.   meloxicam 15 MG tablet Commonly known as: MOBIC TAKE 1/2 TO 1 TABLET DAILY WITH FOOD FOR PAIN AND INFLAMMATION What changed:   how much to take  how to take this  when to take this  additional instructions   oxyCODONE-acetaminophen 5-325 MG tablet Commonly known as: PERCOCET/ROXICET Take 1-2 tablets by mouth every 3 (three) hours as needed for moderate pain or severe pain.   pantoprazole 40 MG tablet Commonly known as: PROTONIX Take 40 mg by mouth 2 (two) times daily.   sildenafil 100 MG tablet Commonly known as: VIAGRA Take 100 mg by mouth daily as needed for erectile dysfunction.   tamsulosin 0.4 MG Caps capsule Commonly known as: FLOMAX Take 0.4 mg by mouth daily after breakfast.   vitamin B-12 1000 MCG tablet Commonly known as: CYANOCOBALAMIN Take 1 tablet (1,000 mcg total) by mouth daily.   VITAMIN D PO Take 5,000 Units by mouth daily.            Discharge Care Instructions  (From admission, onward)         Start     Ordered   09/18/19 0000  Discharge wound care:       Comments: If there is drainage from the incision.  Place dry gauze dressing over incision.  Paint incision with Betadine.  Change dressing daily.  If dressing stays dry for 3 days may discontinue dressing.  Patient may shower without dressing on.   09/18/19 1411           Signed: Blanchie Dessert Bearl Talarico 09/18/2019, 2:11 PM

## 2019-09-15 NOTE — Progress Notes (Signed)
Physical Therapy Treatment Patient Details Name: [name] [name] MRN: [id number] DOB: 1940/07/21 Today's Date: 09/15/2019    History of Present Illness pt is a 79 y/o male with h/o HTN, L hip ORIF, THA then revision, L23 fusion 04/2018, admitted with neurogenic claudication and radiculopathy.  Pt s/p lami and decompression of L34 and L45 with PLIF, PLA, spacers and bone grafting.    PT Comments    Patient progressing well towards PT goals. Improved ambulation distance today but continues to report LLE weakness and fatigue needing seated rest break during mobility. Able to verbally recall 2/3 precautions but needs reinforcement during functional tasks. Continues to have difficulty with bed mobility and bringing LEs onto bed without assist. Eager to get to rehab. Will follow.    Follow Up Recommendations  CIR     Equipment Recommendations  None recommended by PT    Recommendations for Other Services       Precautions / Restrictions Precautions Precautions: Back;Fall Precaution Booklet Issued: No Precaution Comments: Reviewed precautions during functional mobility Required Braces or Orthoses: Spinal Brace Spinal Brace: Lumbar corset;Applied in sitting position Restrictions Weight Bearing Restrictions: No    Mobility  Bed Mobility Overal bed mobility: Needs Assistance Bed Mobility: Rolling;Sidelying to Sit Rolling: Supervision Sidelying to sit: Min guard;HOB elevated     Sit to sidelying: Mod assist General bed mobility comments: Good demo of log roll technique, use of rail and HOB elevated. Increased effort. ASsist to bring LEs into bed to return to supine.  Transfers Overall transfer level: Needs assistance Equipment used: Rolling walker (2 wheeled) Transfers: Sit to/from Stand Sit to Stand: Supervision         General transfer comment: Supervision for safety. Stood from [employer], from toilet x1, from chair x2 with good demo of hand  placement.  Ambulation/Gait Ambulation/Gait assistance: Min assist Gait Distance (Feet): 100 Feet (x2 bouts) Assistive device: Rolling walker (2 wheeled) Gait Pattern/deviations: Step-through pattern;Step-to pattern;Decreased dorsiflexion - left;Decreased dorsiflexion - right;Decreased stance time - left Gait velocity: decreased Gait velocity interpretation: <1.31 ft/sec, indicative of household ambulator General Gait Details: Slow, antaglic like gait with 1 seated rest break due to fatigue. LLE weakness noted.   Stairs             Wheelchair Mobility    Modified Rankin (Stroke Patients Only)       Balance Overall balance assessment: Needs assistance;History of Falls Sitting-balance support: No upper extremity supported;Feet supported Sitting balance-Leahy Scale: Good Sitting balance - Comments: Able to donn brace with setup.   Standing balance support: During functional activity Standing balance-Leahy Scale: Poor Standing balance comment: Dynamically, required BUE support                            Cognition Arousal/Alertness: Awake/alert Behavior During Therapy: WFL for tasks assessed/performed Overall Cognitive Status: Impaired/Different from baseline Area of Impairment: Memory                     Memory: Decreased recall of precautions         General Comments: Able to recall 2/3 precautions. Upset about an incident with a nurse tech over night. Decreased awareness of precautions during functional mobility.      Exercises      General Comments        Pertinent Vitals/Pain Pain Assessment: Faces Faces Pain Scale: Hurts a little bit Pain Location: back Pain Descriptors / Indicators: Operative site guarding;Sore Pain  Intervention(s): Monitored during session;Repositioned    Home Living                      Prior Function            PT Goals (current goals can now be found in the care plan section) Progress towards  PT goals: Progressing toward goals    Frequency    Min 5X/week      PT Plan Current plan remains appropriate    Co-evaluation              AM-PAC PT "6 Clicks" Mobility   Outcome Measure  Help needed turning from your back to your side while in a flat bed without using bedrails?: None Help needed moving from lying on your back to sitting on the side of a flat bed without using bedrails?: A Little Help needed moving to and from a bed to a chair (including a wheelchair)?: A Little Help needed standing up from a chair using your arms (e.g., wheelchair or bedside chair)?: A Little Help needed to walk in hospital room?: A Little Help needed climbing 3-5 steps with a railing? : A Lot 6 Click Score: 18    End of Session Equipment Utilized During Treatment: Back brace;Gait belt Activity Tolerance: Patient tolerated treatment well Patient left: in bed;with call bell/phone within reach Nurse Communication: Mobility status PT Visit Diagnosis: Unsteadiness on feet (R26.81);Difficulty in walking, not elsewhere classified (R26.2)     Time: 1610-9604 PT Time Calculation (min) (ACUTE ONLY): 30 min  Charges:  $Gait Training: 23-37 mins                     Marisa Severin, PT, DPT Acute Rehabilitation Services Pager [phone] Office [phone]       [name] 09/15/2019, 2:29 PM

## 2019-09-15 NOTE — Progress Notes (Signed)
Occupational Therapy Treatment Patient Details Name: [name] [name] MRN: [id number] DOB: 09/05/1940 Today's Date: 09/15/2019    History of present illness pt is a 79 y/o male with h/o HTN, L hip ORIF, THA then revision, L23 fusion 04/2018, admitted with neurogenic claudication and radiculopathy.  Pt s/p lami and decompression of L34 and L45 with PLIF, PLA, spacers and bone grafting.   OT comments  Patient reports pain at incision site and down RLE but able to participate. Patient min assist for supine to sit, min guard to ambulate with RW, and supervision for toileting and toilet transfer with heavy reliance on grab bar. Patient requiring assistance of upper extremities to maintain balance - unable to use both hands for grooming task at sink. Patient returned to bed at patient's request - with mod assist for lower extremities. Patient pleasant and motivated and a great candidate for rehab.   Follow Up Recommendations  CIR    Equipment Recommendations  Other (comment)    Recommendations for Other Services      Precautions / Restrictions Precautions Precautions: Back;Fall Precaution Booklet Issued: No Precaution Comments: Reviewed precautions during functional mobility Required Braces or Orthoses: Spinal Brace Spinal Brace: Lumbar corset;Applied in sitting position Restrictions Weight Bearing Restrictions: No       Mobility Bed Mobility Overal bed mobility: Needs Assistance Bed Mobility: Rolling;Sidelying to Sit Rolling: Supervision Sidelying to sit: Min assist;HOB elevated     Sit to sidelying: Mod assist General bed mobility comments: Good demo of log roll technique, use of rail and HOB elevated. Min assist for trunk negotiation secondary to patient's reports of pain. Increased effort. Mod assist to bring LEs into bed to return to supine.  Transfers Overall transfer level: Needs assistance Equipment used: Rolling walker (2 wheeled) Transfers: Sit to/from Stand Sit to  Stand: Min guard         General transfer comment: Min guard for standing and ambulating due to reports of patient's reports of pain in RLE.    Balance Overall balance assessment: Needs assistance;History of Falls Sitting-balance support: No upper extremity supported;Feet supported Sitting balance-Leahy Scale: Good Sitting balance - Comments: Able to donn brace with setup.   Standing balance support: During functional activity Standing balance-Leahy Scale: Fair Standing balance comment: Dynamically, required BUE support                           ADL either performed or assessed with clinical judgement   ADL       Grooming: Min guard;Standing;Oral care Grooming Details (indicate cue type and reason): Patient stood at sink to perform oral care with min guard from therapist.                 Toilet Transfer: Supervision/safety;Regular Toilet;RW;Grab bars Toilet Transfer Details (indicate cue type and reason): supervision for toilet transfer. Patient maintaining back precautions. Heavy reliance on grab bars. Toileting- Clothing Manipulation and Hygiene: Sit to/from stand;Supervision/safety Toileting - Clothing Manipulation Details (indicate cue type and reason): Patient able to manage hospital gown with toileting - did not void - so did not wipe.             Vision       Perception     Praxis      Cognition Arousal/Alertness: Awake/alert Behavior During Therapy: WFL for tasks assessed/performed Overall Cognitive Status: Impaired/Different from baseline Area of Impairment: Memory  Memory: Decreased recall of precautions         General Comments: Able to recall 2/3 precautions. Upset about an incident with a nurse tech over night. Decreased awareness of precautions during functional mobility.        Exercises     Shoulder Instructions       General Comments      Pertinent Vitals/ Pain       Pain Assessment:  Faces Pain Score: 5  Faces Pain Scale: Hurts a little bit Pain Location: back and RLE Pain Descriptors / Indicators: Operative site guarding;Sore Pain Intervention(s): Monitored during session;Limited activity within patient's tolerance  Home Living                                          Prior Functioning/Environment              Frequency           Progress Toward Goals  OT Goals(current goals can now be found in the care plan section)  Progress towards OT goals: Progressing toward goals  Acute Rehab OT Goals Patient Stated Goal: Get to inpatient rehab OT Goal Formulation: With patient Time For Goal Achievement: 09/22/19 Potential to Achieve Goals: Good  Plan Discharge plan remains appropriate    Co-evaluation                 AM-PAC OT "6 Clicks" Daily Activity     Outcome Measure   Help from another person eating meals?: None   Help from another person toileting, which includes using toliet, bedpan, or urinal?: A Little Help from another person bathing (including washing, rinsing, drying)?: A Little Help from another person to put on and taking off regular upper body clothing?: A Little Help from another person to put on and taking off regular lower body clothing?: A Little 6 Click Score: 16    End of Session Equipment Utilized During Treatment: Gait belt;Rolling walker;Back brace  OT Visit Diagnosis: Unsteadiness on feet (R26.81);Other abnormalities of gait and mobility (R26.89);History of falling (Z91.81);Pain;Muscle weakness (generalized) (M62.81) Pain - Right/Left: Right Pain - part of body: Leg (and back)   Activity Tolerance Patient tolerated treatment well   Patient Left with family/visitor present;with call bell/phone within reach;with bed alarm set   Nurse Communication Mobility status        Time: 3343-5686 OT Time Calculation (min): 22 min  Charges: OT General Charges $OT Visit: 1 Visit OT Treatments $Self  Care/Home Management : 8-22 mins  Derl Barrow, OTR/L [place]  Office [phone] Pager: [place] 09/15/2019, 5:14 PM

## 2019-09-15 NOTE — Progress Notes (Signed)
Inpatient Rehabilitation Admissions Coordinator  I await [facility] medicare determination if Cir will or will not be approved. I met with patient and he is aware. If approved, CIR bed is available to admit to on Saturday. I will await that determination and let Dr. Ellene Route and acute team be aware.  Danne Baxter, RN, MSN Rehab Admissions Coordinator [phone] 09/15/2019 3:32 PM

## 2019-09-16 LAB — GLUCOSE, CAPILLARY
Glucose-Capillary: 125 mg/dL — ABNORMAL HIGH (ref 70–99)
Glucose-Capillary: 104 mg/dL — ABNORMAL HIGH (ref 70–99)
Glucose-Capillary: 132 mg/dL — ABNORMAL HIGH (ref 70–99)
Glucose-Capillary: 103 mg/dL — ABNORMAL HIGH (ref 70–99)

## 2019-09-16 MED ORDER — DOXYCYCLINE HYCLATE 100 MG PO TABS
100.0000 mg | ORAL_TABLET | Freq: Two times a day (BID) | ORAL | Status: DC
  Administered 2019-09-16 – 2019-09-18 (×5): 100 mg via ORAL
  Filled 2019-09-16 (×5): qty 1

## 2019-09-16 NOTE — TOC Progression Note (Signed)
Transition of Care [facility]) - Progression Note    Patient Details  Name: [name] [name] MRN: [id number] Date of Birth: June 18, 1940  Transition of Care [facility]) CM/SW [place], [place] Phone Number: 09/16/2019, 2:14 PM  Clinical Narrative:    CSW submitted insurance authorization for patient. TOC team will follow-up.  Expected Discharge Plan: [place] Barriers to Discharge: Continued Medical Work up, Ship broker  Expected Discharge Plan and Services Expected Discharge Plan: [place] Choice: [place] arrangements for the past 2 months: Single Family Home                                       Social Determinants of Health (SDOH) Interventions    Readmission Risk Interventions No flowsheet data found.

## 2019-09-16 NOTE — Consult Note (Signed)
HAWTHORNE DAY OBS:[id number] DOB: February 02, 1941 DOA: 09/05/2019 PCP: Unk Pinto, MD  Brief Narrative: 79 year old with a history of HTN, pre-DM, CKD stage II, obesity, L2-3 posterior lumbar fusion 2020, and left hip OA status post ORIF who presented with worsening low back pain and bilateral lower extremity weakness.  MRI of the lumbar spine revealed L3-5 spinal stenosis.  Neurosurgery took patient to the OR and performed a laminectomy and decompression at L3-L5 with a fusion on 09/07/2019.  His postop course has been complicated by acute kidney injury for which the hospitalist team was consulted.  Subjective: Chart reviewed.  Labs for 7/16 now available. No acute medical issues requiring a visit with patient today.  TR[facility] will continue to follow along with you in medical consultation.  Recommendations/Plan:  Spinal stenosis with early signs of spinal impingement Care as per primary team (Neurosurgery) - status post laminectomy/decompression of lumbar spine with fusion 09/07/2019 - needs CIR placement  Acute kidney injury on CKD stage II Baseline creatinine 1.4 - peaked at 2.1 - now at baseline   Essential HTN Continue Norvasc, losartan, metoprolol at current doses  DM 2 A1c 6.6 - CBG controlled  BPH Continue Flomax  Abnormal left prosthetic hip X-ray suggested lucency around the prosthetic hip joint - Ortho consulted and recommended vitamin D with no specific physical therapy limits - CRP negative - he has a long complicated history regarding this hip, including a final successful revision per Dr. Mayer Camel  Disposition Medically stable for transfer to CIR or SNF when bed available/insurance authorization completed  Code Status: FULL  DVT prophylaxis: Per primary team  Objective: Blood pressure (!) 142/90, pulse 86, temperature 98 F (36.7 C), temperature source Oral, resp. rate 18, height 5' 10.98" (1.803 m), weight 115 kg, SpO2 100 %. No intake or output data in the 24 hours  ending 09/16/19 0840   Exam: No exam indicated   Data Reviewed: Basic Metabolic Panel: Recent Labs  Lab 09/10/19 0413 09/11/19 0424 09/15/19 0802  NA 132* 132* 134*  K 4.3 4.0 4.4  CL 101 102 100  CO2 22 22 23   GLUCOSE 140* 174* 134*  BUN 27* 21 13  CREATININE 1.62* 1.29* 1.35*  CALCIUM 8.6* 8.3* 9.1  MG 2.1 1.8 1.9    CBC: Recent Labs  Lab 09/10/19 0413 09/11/19 0424 09/15/19 0802  WBC 10.5 8.9 8.3  HGB 9.4* 8.7* 10.8*  HCT 30.5* 27.8* 34.5*  MCV 84.3 84.5 85.4  PLT 233 252 470*    Liver Function Tests: No results for input(s): AST, ALT, ALKPHOS, BILITOT, PROT, ALBUMIN in the last 168 hours. No results for input(s): LIPASE, AMYLASE in the last 168 hours. No results for input(s): AMMONIA in the last 168 hours.  Coags: No results for input(s): INR in the last 168 hours.  Invalid input(s): PT No results for input(s): APTT in the last 168 hours.  Cardiac Enzymes: No results for input(s): CKTOTAL, CKMB, CKMBINDEX, TROPONINI in the last 168 hours.  CBG: Recent Labs  Lab 09/15/19 0645 09/15/19 1121 09/15/19 1623 09/15/19 2103 09/16/19 0613  GLUCAP 138* 120* 112* 147* 125*    Recent Results (from the past 240 hour(s))  Surgical PCR screen     Status: Abnormal   Collection Time: 09/07/19  5:57 AM   Specimen: Nasal Mucosa; Nasal Swab  Result Value Ref Range Status   MRSA, PCR NEGATIVE NEGATIVE Final   Staphylococcus aureus POSITIVE (A) NEGATIVE Final    Comment: (NOTE) The Xpert SA Assay (FDA  approved for NASAL specimens in patients 3 years of age and older), is one component of a comprehensive surveillance program. It is not intended to diagnose infection nor to guide or monitor treatment. Performed at [facility], [place] [address] [place], [place] [address]      Scheduled Meds:  Scheduled Meds: . amLODipine  5 mg Oral Daily  . docusate sodium  100 mg Oral BID  . fenofibrate  160 mg Oral Daily  . finasteride  5 mg Oral Daily  .  insulin aspart  0-5 Units Subcutaneous QHS  . insulin aspart  0-9 Units Subcutaneous TID WC  . losartan  100 mg Oral Daily  . magnesium oxide  200 mg Oral BID  . meloxicam  7.5-15 mg Oral Daily  . metoprolol tartrate  12.5 mg Oral BID  . pantoprazole  40 mg Oral BID  . polyethylene glycol  17 g Oral BID  . senna  1 tablet Oral BID  . tamsulosin  0.4 mg Oral QPC breakfast  . vitamin B-12  1,000 mcg Oral Daily    Cherene Altes , MD   [facility] Office  [phone]  09/16/2019, 8:40 AM   LOS: 9 days

## 2019-09-16 NOTE — Progress Notes (Signed)
Physical Therapy Treatment Patient Details Name: [name] [name] MRN: [id number] DOB: [birth year] Today's Date: 09/16/2019    History of Present Illness pt is a 79 y/o male with h/o HTN, L hip ORIF, THA then revision, L23 fusion 04/2018, admitted with neurogenic claudication and radiculopathy.  Pt s/p lami and decompression of L34 and L45 with PLIF, PLA, spacers and bone grafting.    PT Comments    Pt in bed upon arrival of PT, agreeable to session with focus on progressing ambulation and functional endurance of LLE. The pt was able to demo good hallway ambulation, but remains limited by rapid fatigue of LLE that results in significant reliance on BUE, trunk flexion, and seated rest break prior to continuing ambulation. The pt was able to complete a series of exercises for his LE both after the first bout of ambulation, and once returned to his room. The pt is mobilizing well without LOB, but expressed concerns about being able to complete ADLs and home management with his spinal precautions and his wife's back pain. This, in addition to his continued endurance and strength deficits in his LLE, makes him a great candidate for short term SNF rehab prior to return home.    Follow Up Recommendations  SNF;Supervision/Assistance - 24 hour     Equipment Recommendations       Recommendations for Other Services       Precautions / Restrictions Precautions Precautions: Back;Fall Precaution Booklet Issued: No Precaution Comments: Reviewed precautions during functional mobility Required Braces or Orthoses: Spinal Brace Spinal Brace: Lumbar corset;Applied in sitting position Restrictions Weight Bearing Restrictions: No    Mobility  Bed Mobility Overal bed mobility: Needs Assistance             General bed mobility comments: pt sitting in recliner upon arrival  Transfers Overall transfer level: Needs assistance Equipment used: Rolling walker (2 wheeled) Transfers: Sit to/from Stand Sit  to Stand: Min guard         General transfer comment: Min guard for standing and ambulating due to reports of patient's reports of pain in RLE.VC for hand placement, very slow rise with trunk flexion  Ambulation/Gait Ambulation/Gait assistance: Min guard Gait Distance (Feet): 100 Feet (x2 with seated rest between) Assistive device: Rolling walker (2 wheeled) Gait Pattern/deviations: Step-through pattern;Step-to pattern;Decreased dorsiflexion - left;Decreased dorsiflexion - right;Decreased stance time - left;Trunk flexed Gait velocity: decreased Gait velocity interpretation: <1.31 ft/sec, indicative of household ambulator General Gait Details: Slow, antaglic like gait with 1 seated rest break due to fatigue. LLE weakness noted. significant reliance on BUE, antalgic gait becomes more severe with fatigue      Balance Overall balance assessment: Needs assistance;History of Falls Sitting-balance support: No upper extremity supported;Feet supported Sitting balance-Leahy Scale: Good Sitting balance - Comments: Able to donn brace with setup.   Standing balance support: During functional activity Standing balance-Leahy Scale: Fair Standing balance comment: Dynamically, required BUE support                            Cognition Arousal/Alertness: Awake/alert Behavior During Therapy: WFL for tasks assessed/performed Overall Cognitive Status: Impaired/Different from baseline                       Memory: Decreased recall of precautions         General Comments: able to recall precautions but poor functional application with mobility      Exercises General Exercises - Lower  Extremity Long Arc Quad: Strengthening;Both;10 reps;Seated (vs manual resistance) Heel Slides: Strengthening;Both;Seated;10 reps (vs manual resistance) Heel Raises: AROM;Both;10 reps;Seated Other Exercises Other Exercises: 5x sit-stand from recliner with use of BUE and RW    General  Comments General comments (skin integrity, edema, etc.): Pt educated on functional use of spinal precautions      Pertinent Vitals/Pain Pain Assessment: No/denies pain Faces Pain Scale: Hurts a little bit Pain Location: surgical site Pain Descriptors / Indicators: Operative site guarding;Sore Pain Intervention(s): Limited activity within patient's tolerance;Monitored during session;Repositioned           PT Goals (current goals can now be found in the care plan section) Acute Rehab PT Goals Patient Stated Goal: get more rehab prior to return home PT Goal Formulation: With patient Time For Goal Achievement: 09/26/19 Potential to Achieve Goals: Good Progress towards PT goals: Progressing toward goals    Frequency    Min 5X/week      PT Plan Discharge plan needs to be updated       AM-PAC PT "6 Clicks" Mobility   Outcome Measure  Help needed turning from your back to your side while in a flat bed without using bedrails?: None Help needed moving from lying on your back to sitting on the side of a flat bed without using bedrails?: A Little Help needed moving to and from a bed to a chair (including a wheelchair)?: A Little Help needed standing up from a chair using your arms (e.g., wheelchair or bedside chair)?: A Little Help needed to walk in hospital room?: A Little Help needed climbing 3-5 steps with a railing? : A Lot 6 Click Score: 18    End of Session Equipment Utilized During Treatment: Back brace;Gait belt Activity Tolerance: Patient tolerated treatment well;Patient limited by fatigue Patient left: in bed;with call bell/phone within reach Nurse Communication: Mobility status PT Visit Diagnosis: Unsteadiness on feet (R26.81);Difficulty in walking, not elsewhere classified (R26.2) Pain - Right/Left: Left Pain - part of body:  (low back)     Time: 1583-0940 PT Time Calculation (min) (ACUTE ONLY): 36 min  Charges:  $Gait Training: 8-22 mins $Therapeutic  Exercise: 8-22 mins                     Karma Ganja, PT, DPT   Acute Rehabilitation Department Pager #: [phone]   Otho Bellows 09/16/2019, 1:25 PM

## 2019-09-16 NOTE — Progress Notes (Signed)
Subjective: Patient reports Doing better pain well controlled  Objective: Vital signs in last 24 hours: Temp:  [97.8 F (36.6 C)-99.4 F (37.4 C)] 98 F (36.7 C) (07/17 0746) Pulse Rate:  [60-86] 86 (07/17 0746) Resp:  [16-18] 18 (07/17 0746) BP: (116-142)/(61-90) 142/90 (07/17 0746) SpO2:  [97 %-100 %] 100 % (07/17 0746)  Intake/Output from previous day: No intake/output data recorded. Intake/Output this shift: No intake/output data recorded.  Awake alert strength 5 out of 5 some slight baseline dorsiflexion weakness  Lab Results: Recent Labs    09/15/19 0802  WBC 8.3  HGB 10.8*  HCT 34.5*  PLT 470*   BMET Recent Labs    09/15/19 0802  NA 134*  K 4.4  CL 100  CO2 23  GLUCOSE 134*  BUN 13  CREATININE 1.35*  CALCIUM 9.1    Studies/Results: No results found.  Assessment/Plan: Continue to wait on placement continue physical Occupational Therapy  LOS: 9 days     Elaina Hoops 09/16/2019, 9:31 AM

## 2019-09-17 DIAGNOSIS — I10 Essential (primary) hypertension: Secondary | ICD-10-CM

## 2019-09-17 LAB — GLUCOSE, CAPILLARY
Glucose-Capillary: 106 mg/dL — ABNORMAL HIGH (ref 70–99)
Glucose-Capillary: 124 mg/dL — ABNORMAL HIGH (ref 70–99)
Glucose-Capillary: 138 mg/dL — ABNORMAL HIGH (ref 70–99)
Glucose-Capillary: 110 mg/dL — ABNORMAL HIGH (ref 70–99)

## 2019-09-17 NOTE — TOC Progression Note (Signed)
Transition of Care [facility]) - Progression Note    Patient Details  Name: [name] [name] MRN: [id number] Date of Birth: 10/06/1940  Transition of [facility] CM/SW [place], [place] Phone Number: 09/17/2019, 7:58 AM  Clinical Narrative:    CSW followed-up with [facility] on the status of insurance authorization. Authorization currently still pending. CSW will continue to follow.   Expected Discharge Plan: [place] Barriers to Discharge: Continued Medical Work up, Ship broker  Expected Discharge Plan and Services Expected Discharge Plan: [place] Choice: [place] arrangements for the past 2 months: Single Family Home                                       Social Determinants of Health (SDOH) Interventions    Readmission Risk Interventions No flowsheet data found.

## 2019-09-17 NOTE — Consult Note (Signed)
[name] [name] WTU:[id number] DOB: 11-May-1940 DOA: 09/05/2019 PCP: Unk Pinto, MD  Brief Narrative: 79 year old with a history of HTN, pre-DM, CKD stage II, obesity, L2-3 posterior lumbar fusion 2020, and left hip OA status post ORIF who presented with worsening low back pain and bilateral lower extremity weakness.  MRI of the lumbar spine revealed L3-5 spinal stenosis.  Neurosurgery took patient to the OR and performed a laminectomy and decompression at L3-L5 with a fusion on 09/07/2019.  His postop course has been complicated by acute kidney injury for which the hospitalist team was consulted.  Subjective: Chart reviewed. No acute medical issues requiring a visit with patient today.  TRH will continue to follow along with you in medical consultation.  Recommendations/Plan:  Spinal stenosis with early signs of spinal impingement Care as per primary team (Neurosurgery) - status post laminectomy/decompression of lumbar spine with fusion 09/07/2019 - needs CIR placement  Acute kidney injury on CKD stage II Baseline creatinine 1.4 - peaked at 2.1 - now at baseline   Essential HTN Continue Norvasc, losartan, metoprolol at current doses  DM 2 A1c 6.6 - CBG controlled  BPH Continue Flomax  Abnormal left prosthetic hip X-ray suggested lucency around the prosthetic hip joint - Ortho consulted and recommended vitamin D with no specific physical therapy limits - CRP negative - he has a long complicated history regarding this hip, including a final successful revision per Dr. Mayer Camel  Disposition Medically stable for transfer to CIR or SNF when bed available/insurance authorization completed  Code Status: FULL  DVT prophylaxis: Per primary team  Objective: Blood pressure 110/64, pulse 71, temperature 98.1 F (36.7 C), temperature source Oral, resp. rate 16, height 5' 10.98" (1.803 m), weight 115 kg, SpO2 95 %. No intake or output data in the 24 hours ending 09/17/19 0745   Exam: No  exam indicated   Data Reviewed: Basic Metabolic Panel: Recent Labs  Lab 09/11/19 0424 09/15/19 0802  NA 132* 134*  K 4.0 4.4  CL 102 100  CO2 22 23  GLUCOSE 174* 134*  BUN 21 13  CREATININE 1.29* 1.35*  CALCIUM 8.3* 9.1  MG 1.8 1.9    CBC: Recent Labs  Lab 09/11/19 0424 09/15/19 0802  WBC 8.9 8.3  HGB 8.7* 10.8*  HCT 27.8* 34.5*  MCV 84.5 85.4  PLT 252 470*    CBG: Recent Labs  Lab 09/16/19 0613 09/16/19 1129 09/16/19 1624 09/16/19 2118 09/17/19 0649  GLUCAP 125* 104* 132* 103* 106*     Scheduled Meds:  Scheduled Meds: . amLODipine  5 mg Oral Daily  . docusate sodium  100 mg Oral BID  . doxycycline  100 mg Oral Q12H  . fenofibrate  160 mg Oral Daily  . finasteride  5 mg Oral Daily  . insulin aspart  0-5 Units Subcutaneous QHS  . insulin aspart  0-9 Units Subcutaneous TID WC  . losartan  100 mg Oral Daily  . magnesium oxide  200 mg Oral BID  . meloxicam  7.5-15 mg Oral Daily  . metoprolol tartrate  12.5 mg Oral BID  . pantoprazole  40 mg Oral BID  . polyethylene glycol  17 g Oral BID  . senna  1 tablet Oral BID  . tamsulosin  0.4 mg Oral QPC breakfast  . vitamin B-12  1,000 mcg Oral Daily    Cherene Altes , MD   [facility] Office  [phone]  09/17/2019, 7:45 AM   LOS: 10 days

## 2019-09-17 NOTE — Progress Notes (Signed)
Subjective: Patient reports doing ok today, concerned about the drainage that's coming from his back for the last couple days. Was placed on doxycycline. Frustrated with the care on the floor.   Objective: Vital signs in last 24 hours: Temp:  [97.9 F (36.6 C)-98.2 F (36.8 C)] 98.1 F (36.7 C) (07/18 0730) Pulse Rate:  [54-75] 71 (07/18 0730) Resp:  [16-18] 16 (07/18 0730) BP: (110-131)/(59-86) 110/64 (07/18 0730) SpO2:  [95 %-100 %] 95 % (07/18 0730)  Intake/Output from previous day: No intake/output data recorded. Intake/Output this shift: No intake/output data recorded.  Neurologic: Grossly normal  Lab Results: Lab Results  Component Value Date   WBC 8.3 09/15/2019   HGB 10.8 (L) 09/15/2019   HCT 34.5 (L) 09/15/2019   MCV 85.4 09/15/2019   PLT 470 (H) 09/15/2019   Lab Results  Component Value Date   INR 1.28 05/03/2017   BMET Lab Results  Component Value Date   NA 134 (L) 09/15/2019   K 4.4 09/15/2019   CL 100 09/15/2019   CO2 23 09/15/2019   GLUCOSE 134 (H) 09/15/2019   BUN 13 09/15/2019   CREATININE 1.35 (H) 09/15/2019   CALCIUM 9.1 09/15/2019    Studies/Results: No results found.  Assessment/Plan: Status post lumbar fusion, awaiting snf placement. He is on doxycycline. Wound actually looks very good today. Dressing is clean and has been on for the last 5 hours. Will continue to monitor.    LOS: 10 days    Ocie Cornfield [facility] 09/17/2019, 9:47 AM

## 2019-09-18 DIAGNOSIS — I509 Heart failure, unspecified: Secondary | ICD-10-CM

## 2019-09-18 LAB — GLUCOSE, CAPILLARY
Glucose-Capillary: 107 mg/dL — ABNORMAL HIGH (ref 70–99)
Glucose-Capillary: 110 mg/dL — ABNORMAL HIGH (ref 70–99)

## 2019-09-18 MED ORDER — OXYCODONE-ACETAMINOPHEN 5-325 MG PO TABS: 1.0000 | ORAL_TABLET | ORAL | 0 refills | Status: DC | PRN

## 2019-09-18 NOTE — Plan of Care (Signed)
  Problem: Education: Goal: Knowledge of General Education information will improve Description: Including pain rating scale, medication(s)/side effects and non-pharmacologic comfort measures Outcome: Progressing   Problem: Activity: Goal: Risk for activity intolerance will decrease Outcome: Progressing   Problem: Nutrition: Goal: Adequate nutrition will be maintained Outcome: Progressing   

## 2019-09-18 NOTE — Social Work (Signed)
Clinical Social Worker facilitated patient discharge including contacting patient family and facility to confirm patient discharge plans.  Clinical information faxed to facility and family agreeable with plan.  CSW confirmed family transport via personal vehicle to [place] RN to call [phone]  with report prior to discharge.  Clinical Social Worker will sign off for now as social work intervention is no longer needed. Please consult [place] again if new need arises.  Westley Hummer, MSW, LCSW Clinical Social Worker

## 2019-09-18 NOTE — Progress Notes (Signed)
Pt's IV removed; site is clean, dry and intact. Discharge instructions reviewed with patient and he voiced understanding. Medication hard script given to pt at d/c. Personal belongings packed and sent with pt. Pt. transported via wheelchair to private vehicle driven by daughter. Report called and given to [facility], RN at [employer].

## 2019-09-18 NOTE — Progress Notes (Signed)
Physical Therapy Treatment Patient Details Name: [name] [name] MRN: [id number] DOB: Jul 18, 1940 Today's Date: 09/18/2019    History of Present Illness pt is a 79 y/o male with h/o HTN, L hip ORIF, THA then revision, L23 fusion 04/2018, admitted with neurogenic claudication and radiculopathy.  Pt s/p lami and decompression of L34 and L45 with PLIF, PLA, spacers and bone grafting.    PT Comments    Pt progressing towards physical therapy goals. Tolerance for functional activity remains decreased, however pt was able to improve gait distance this session. Chair follow again utilized and continue to feel this patient would benefit from follow-up therapies prior to return home with wife. Will continue to follow and progress as able per POC.     Follow Up Recommendations  SNF;Supervision/Assistance - 24 hour     Equipment Recommendations  None recommended by PT    Recommendations for Other Services       Precautions / Restrictions Precautions Precautions: Back;Fall Precaution Booklet Issued: No Precaution Comments: Reviewed precautions during functional mobility Required Braces or Orthoses: Spinal Brace Spinal Brace: Lumbar corset;Applied in sitting position Restrictions Weight Bearing Restrictions: No    Mobility  Bed Mobility Overal bed mobility: Needs Assistance Bed Mobility: Rolling;Sidelying to Sit Rolling: Supervision Sidelying to sit: Supervision;HOB elevated       General bed mobility comments: Increased time but pt able to transition to EOB without assistance.   Transfers Overall transfer level: Needs assistance Equipment used: Rolling walker (2 wheeled) Transfers: Sit to/from Stand Sit to Stand: Min guard         General transfer comment: Hands-on guarding for power-up to full stand. No assist required however appeared unsteady as he fatigued.   Ambulation/Gait Ambulation/Gait assistance: Min guard Gait Distance (Feet): 250 Feet Assistive device: Rolling  walker (2 wheeled) Gait Pattern/deviations: Step-through pattern;Step-to pattern;Decreased dorsiflexion - left;Decreased dorsiflexion - right;Decreased stance time - left;Trunk flexed Gait velocity: decreased Gait velocity interpretation: <1.8 ft/sec, indicate of risk for recurrent falls General Gait Details: Slow, antaglic like gait with 1 seated rest break due to fatigue. LLE weakness noted. significant reliance on BUE, antalgic gait becomes more severe with fatigue   Stairs             Wheelchair Mobility    Modified Rankin (Stroke Patients Only)       Balance Overall balance assessment: Needs assistance;History of Falls Sitting-balance support: No upper extremity supported;Feet supported Sitting balance-Leahy Scale: Good Sitting balance - Comments: Able to donn brace with setup.   Standing balance support: During functional activity Standing balance-Leahy Scale: Fair Standing balance comment: Dynamically, required BUE support                            Cognition Arousal/Alertness: Awake/alert Behavior During Therapy: WFL for tasks assessed/performed Overall Cognitive Status: Within Functional Limits for tasks assessed                                        Exercises      General Comments        Pertinent Vitals/Pain Pain Assessment: Faces Faces Pain Scale: Hurts a little bit Pain Location: surgical site/back Pain Descriptors / Indicators: Operative site guarding;Sore Pain Intervention(s): Limited activity within patient's tolerance;Monitored during session;Repositioned    Home Living Family/patient expects to be discharged to:: Private residence Living Arrangements: Spouse/significant other Available Help at  Discharge: Family;Available 24 hours/day Type of Home: House Home Access: Stairs to enter Entrance Stairs-Rails: Left (one set of steps has a rail) Home Layout: One level;Other (Comment) (1 upstairs room. 2 steps to  bathroom. ) Home Equipment: Gilford Rile - 4 wheels;Cane - single point;Bedside commode;Walker - 2 wheels;Grab bars - tub/shower      Prior Function Level of Independence: Independent with assistive device(s)      Comments: has used a cane recently   PT Goals (current goals can now be found in the care plan section) Acute Rehab PT Goals Patient Stated Goal: get more rehab prior to return home PT Goal Formulation: With patient Time For Goal Achievement: 09/26/19 Potential to Achieve Goals: Good Progress towards PT goals: Progressing toward goals    Frequency    Min 5X/week      PT Plan Discharge plan needs to be updated    Co-evaluation              AM-PAC PT "6 Clicks" Mobility   Outcome Measure  Help needed turning from your back to your side while in a flat bed without using bedrails?: None Help needed moving from lying on your back to sitting on the side of a flat bed without using bedrails?: A Little Help needed moving to and from a bed to a chair (including a wheelchair)?: A Little Help needed standing up from a chair using your arms (e.g., wheelchair or bedside chair)?: A Little Help needed to walk in hospital room?: A Little Help needed climbing 3-5 steps with a railing? : A Lot 6 Click Score: 18    End of Session Equipment Utilized During Treatment: Back brace;Gait belt Activity Tolerance: Patient tolerated treatment well;Patient limited by fatigue Patient left: in bed;with call bell/phone within reach Nurse Communication: Mobility status PT Visit Diagnosis: Unsteadiness on feet (R26.81);Difficulty in walking, not elsewhere classified (R26.2) Pain - Right/Left: Left Pain - part of body:  (low back)     Time: 0940-1003 PT Time Calculation (min) (ACUTE ONLY): 23 min  Charges:  $Gait Training: 23-37 mins                     [name], PT, DPT Acute Rehabilitation Services Pager: [phone] Office: [phone]    Thelma Comp 09/18/2019,  1:00 PM

## 2019-09-18 NOTE — Consult Note (Signed)
[name] [name] DXA:[id number] DOB: 02-Jul-1940 DOA: 09/05/2019 PCP: Unk Pinto, MD  Brief Narrative: 79 year old with a history of HTN, pre-DM, CKD stage II, obesity, L2-3 posterior lumbar fusion 2020, and left hip OA status post ORIF who presented with worsening low back pain and bilateral lower extremity weakness.  MRI of the lumbar spine revealed L3-5 spinal stenosis.  Neurosurgery took patient to the OR and performed a laminectomy and decompression at L3-L5 with a fusion on 09/07/2019.  His postop course has been complicated by acute kidney injury for which the hospitalist team was consulted.  Subjective: Chart reviewed. No acute medical issues requiring a visit with patient today.  TRH will continue to follow along with you in medical consultation.  Recommendations/Plan:  Spinal stenosis with early signs of spinal impingement Care as per primary team (Neurosurgery) - status post laminectomy/decompression of lumbar spine with fusion 09/07/2019 - needs CIR or SNF placement   Acute kidney injury on CKD stage II Baseline creatinine 1.4 - peaked at 2.1 - now at baseline   Essential HTN Continue Norvasc, losartan, metoprolol at current doses - BP well controlled   DM 2 A1c 6.6 - CBG controlled  BPH Continue Flomax  Abnormal left prosthetic hip X-ray suggested lucency around the prosthetic hip joint - Ortho consulted and recommended vitamin D with no specific physical therapy limits - CRP negative - he has a long complicated history regarding this hip, including a final successful revision per Dr. Mayer Camel  Disposition Medically stable for transfer to CIR or SNF when bed available/insurance authorization completed  Code Status: FULL  DVT prophylaxis: Per primary team  Objective: Blood pressure (!) 111/54, pulse 82, temperature 97.7 F (36.5 C), temperature source Oral, resp. rate 20, height 5' 10.98" (1.803 m), weight 115 kg, SpO2 99 %.  Intake/Output Summary (Last 24 hours) at  09/18/2019 0750 Last data filed at 09/18/2019 0738 Gross per 24 hour  Intake 240 ml  Output --  Net 240 ml     Exam: No exam indicated   Data Reviewed: Basic Metabolic Panel: Recent Labs  Lab 09/15/19 0802  NA 134*  K 4.4  CL 100  CO2 23  GLUCOSE 134*  BUN 13  CREATININE 1.35*  CALCIUM 9.1  MG 1.9    CBC: Recent Labs  Lab 09/15/19 0802  WBC 8.3  HGB 10.8*  HCT 34.5*  MCV 85.4  PLT 470*    CBG: Recent Labs  Lab 09/17/19 0649 09/17/19 1122 09/17/19 1623 09/17/19 2133 09/18/19 0614  GLUCAP 106* 124* 138* 110* 107*     Scheduled Meds:  Scheduled Meds: . amLODipine  5 mg Oral Daily  . docusate sodium  100 mg Oral BID  . doxycycline  100 mg Oral Q12H  . fenofibrate  160 mg Oral Daily  . finasteride  5 mg Oral Daily  . insulin aspart  0-5 Units Subcutaneous QHS  . insulin aspart  0-9 Units Subcutaneous TID WC  . losartan  100 mg Oral Daily  . magnesium oxide  200 mg Oral BID  . meloxicam  7.5-15 mg Oral Daily  . metoprolol tartrate  12.5 mg Oral BID  . pantoprazole  40 mg Oral BID  . polyethylene glycol  17 g Oral BID  . senna  1 tablet Oral BID  . tamsulosin  0.4 mg Oral QPC breakfast  . vitamin B-12  1,000 mcg Oral Daily    Cherene Altes , MD   [facility] Office  [phone]  09/18/2019,  7:50 AM   LOS: 11 days

## 2019-09-18 NOTE — Progress Notes (Signed)
Patient ID: [name] [name], male   DOB: [birth year], 79 y.o.   MRN: [id number] Vital signs are stable Patient is feeling better but still weak particularly on the left side Inpatient rehabilitation was turned down on Friday Hopeful to get possibility of transfer to skilled nursing facility Incision remains clean and dry no drainage noted today I would continue to maintain the dressing on his back We will prepare his discharge with a prescription in case availability of a bed comes fast He did complain of some difficulty with swallowing pills and food He has had bougie dilatations in the past of his esophagus Feels he can handle this for the current time

## 2019-09-18 NOTE — TOC Transition Note (Signed)
Transition of Care [facility]) - CM/SW Discharge Note   Patient Details  Name: [name] [name] MRN: [id number] Date of Birth: November 23, 1940  Transition of Care [facility]) CM/SW Contact:  Alexander Mt, LCSW Phone Number: 09/18/2019, 4:04 PM   Clinical Narrative:    [name] received for [facility] 7/19-7/21; ref #[id number]. Confirmed pt script on chart, spoke with pt/pt daughter/bedside RN. All are aware pt daughter will transport pt to SNF in personal vehicle and is aware to take script with her.   Final next level of care: Skilled Nursing Facility Barriers to Discharge: Barriers Resolved   Patient Goals and CMS Choice   CMS Medicare.gov Compare Post Acute Care list provided to:: Patient Choice offered to / list presented to : Patient  Discharge Placement   Existing PASRR number confirmed : 09/14/19          Patient chooses bed at: [place] Patient to be transferred to facility by: pt daughter vehicle Name of family member notified: pt daughter [name] via telephone Patient and family notified of of transfer: 09/18/19  Discharge Plan and Services Post Acute Care Choice: Home Health            Readmission Risk Interventions No flowsheet data found.

## 2019-09-19 ENCOUNTER — Telehealth: Payer: Self-pay | Admitting: *Deleted

## 2019-09-19 NOTE — Telephone Encounter (Signed)
Left messages on home and cell numbers in regard to recent hospital discharge to [place] for rehab. Asked patient or spouse to return my call.

## 2019-09-19 NOTE — Telephone Encounter (Signed)
Called patient on 09/19/2019 , 11:55 AM in an attempt to reach the patient for a hospital follow up.   Admit date: 09/05/19 Discharge: 09/18/19   He does not have any questions or concerns about medications from the hospital admission. The patient's medications were reviewed over the phone, they were counseled to bring in all current medications to the hospital follow up visit. Patient continues to take Oxycodone for moderate pain.  I advised the patient to call if any questions or concerns arise about the hospital admission or medications    Home health was not started in the hospital. Patient was discharged to [facility], but left after he arrived there and went to his home. He has contacted the nursing home to see if they can provide equipment to meet his needs. He has also contacted Dr Clarice Pole office in regard to receiving physical therapy at home. Patient will call back to inform our office about the final decision. All questions were answered and a follow up appointment was made. No appointment was made at this time, pending the patient's return call.  Prior to Admission medications   Medication Sig Start Date End Date Taking? Authorizing Provider  aspirin EC 81 MG tablet Take 81 mg by mouth daily.    [provider]  b complex vitamins tablet Take 1 tablet by mouth daily.    [provider]  bisoprolol-hydrochlorothiazide (ZIAC) 5-6.25 MG tablet Take 1 tablet Daily for BP Patient taking differently: Take 1 tablet by mouth in the morning. Take 1 tablet Daily for BP 11/03/18   Unk Pinto, MD  Cholecalciferol (VITAMIN D PO) Take 5,000 Units by mouth daily.     [provider]  fenofibrate (TRICOR) 145 MG tablet Take 1 tablet Daily for Triglycerides (Blood Fats) Patient taking differently: Take 145 mg by mouth daily. For triglycerides (blood fats) 10/17/18   Unk Pinto, MD  finasteride (PROSCAR) 5 MG tablet Take 5 mg by mouth daily.    [provider]  Flaxseed, Linseed, (FLAX SEEDS PO) Take 1,000 mg by mouth daily.     [provider]  losartan (COZAAR) 100 MG tablet TAKE 1 TABLET BY MOUTH DAILY FOR BLOOD PRESSURE Patient taking differently: Take 100 mg by mouth every evening.  05/03/19   Garnet Sierras, NP  Magnesium Oxide 250 MG TABS Take 250 mg by mouth 2 (two) times daily.     [provider]  meloxicam (MOBIC) 15 MG tablet TAKE 1/2 TO 1 TABLET DAILY WITH FOOD FOR PAIN AND INFLAMMATION Patient taking differently: Take 7.5-15 mg by mouth daily.  08/14/19   Unk Pinto, MD  Omega-3 Fatty Acids (FISH OIL) 1200 MG CAPS Take 1,200 mg by mouth 2 (two) times daily.     [provider]  oxyCODONE-acetaminophen (PERCOCET/ROXICET) 5-325 MG tablet Take 1-2 tablets by mouth every 3 (three) hours as needed for moderate pain or severe pain. 09/18/19   Kristeen Miss, MD  pantoprazole (PROTONIX) 40 MG tablet Take 40 mg by mouth 2 (two) times daily.    [provider]  sildenafil (VIAGRA) 100 MG tablet Take 100 mg by mouth daily as needed for erectile dysfunction. 08/18/19   [provider]  tamsulosin (FLOMAX) 0.4 MG CAPS capsule Take 0.4 mg by mouth daily after breakfast.  11/06/14   [provider]  vitamin B-12 (CYANOCOBALAMIN) 1000 MCG tablet Take 1 tablet (1,000 mcg total) by mouth daily. 05/05/17   Hongalgi, Lenis Dickinson, MD

## 2019-09-21 ENCOUNTER — Telehealth: Payer: Self-pay | Admitting: *Deleted

## 2019-09-21 NOTE — Telephone Encounter (Signed)
Patient called and reported he is taking Meloxicam 15 mg daily for arthritis pain in his right foot, but not relived. Per Dr Melford Aase, since he is not taking Percocet, he can add Tylenol 500 mg 2 tablets 4 times a day for arthritis pain. Patient also complained of being unable to swallow his food, even though he has had his esophagus streched in the past. Per Dr Melford Aase, continue his Protonix and make an appointment with his GI doctor, when he able.

## 2019-09-22 ENCOUNTER — Ambulatory Visit: Payer: Medicare Other | Admitting: Internal Medicine

## 2019-09-22 ENCOUNTER — Other Ambulatory Visit: Payer: Self-pay | Admitting: *Deleted

## 2019-09-22 DIAGNOSIS — M48062 Spinal stenosis, lumbar region with neurogenic claudication: Secondary | ICD-10-CM | POA: Diagnosis not present

## 2019-09-22 NOTE — Patient Outreach (Addendum)
[facility]) Care Management  09/22/2019  [name] [name] [birth year] [id number]   Telephone outreach to answer RED FLAG on EMMI discharge call.  No answer, left message and requested a return call.  [name] returned my call. He has had all his questions answered at this time. His main concern is getting his Home Health PT started as soon as possible. He left [facility] because they were not prepared to provide him with the care he went there for.  He has agreed to work with me for care management services. We agreed I will call next Tuesday to check to see if his services have been arranged and go from there.  Eulah Pont. Myrtie Neither, MSN, [facility] Gerontological Nurse Practitioner [facility] Care Management [phone]

## 2019-09-25 ENCOUNTER — Ambulatory Visit: Payer: Self-pay | Admitting: *Deleted

## 2019-09-26 ENCOUNTER — Other Ambulatory Visit: Payer: Self-pay | Admitting: *Deleted

## 2019-09-26 NOTE — Patient Outreach (Signed)
[facility]) Care Management  09/26/2019  [name] [name] October 26, 1940 [id number]  Telephone outreach.  Updated [name] on outreaches made on his behalf to get requested in home PT for him ordered (Left message and Dr. Clarice Pole office, talked with [facility] representative Levonne Spiller who says they will be getting the order from Dr. Ellene Route.   Pt reports he is getting along pretty well but he want to ensure he gets back on track with his mobility and doing it safely and not put himself at risk for further injury.  We will talk again in one week.  Eulah Pont. Myrtie Neither, MSN, [facility] Gerontological Nurse Practitioner [facility] Care Management [phone]

## 2019-09-28 NOTE — Progress Notes (Signed)
Hospital follow up  Assessment and Plan: Hospital visit follow up for   Chronic congestive heart failure, unspecified heart failure type ([place]) Weight is down, no SOB, no CP Monitor weight  Spinal stenosis of lumbar region, unspecified whether neurogenic claudication present S/p surgery No fever, no chills Wants PT- will follow up with [facility]  Right foot pain -     diclofenac Sodium (VOLTAREN) 1 % GEL; Apply 4 g topically 4 (four) times daily. Has history of hallux ridigus- try voltern gel, wear stiff shoes, do not walk bare foot, follow up ortho/foot doctor if not better  no signs of gout  Dysphasia He would like a second opinion from a GI doctor that Dr. Ellene Route recommends. Will try to find out who and put in referral.  No pain with swallowing, no some trouble with liquids rarely Has had EGD 2020, had barium swallow 2018, has not had manometry On protonix 40mg  BID, on mobic, SBL NTG did not help    All medications were reviewed with patient and family and fully reconciled. All questions answered fully, and patient and family members were encouraged to call the office with any further questions or concerns. Discussed goal to avoid readmission related to this diagnosis.  There are no discontinued medications.  CAN NOT DO FOR BCBS REGULAR OR MEDICARE Over 40 minutes of exam, counseling, chart review, and complex, high/moderate level critical decision making was performed this visit.   Future Appointments  Date Time Provider [place]  10/03/2019 11:00 AM Deloria Lair, NP [facility] None  11/01/2019  9:00 AM Vicie Mutters, PA-C [facility] None  03/04/2020  2:00 PM Unk Pinto, MD [facility] None     HPI 79 y.o.male presents for follow up for transition from recent hospitalization or SNIF stay. Admit date to the hospital was 09/05/19, patient was discharged from the hospital on 09/18/19 and our clinical staff contacted the office the day after discharge to set up a follow  up appointment. The discharge summary, medications, and diagnostic test results were reviewed before meeting with the patient. The patient was admitted for:   Admission Diagnoses: Lumbar spinal stenosis L3-4 and L4-5 with paraparesis. History of decompression L2-3. History of left hip arthroplasty with multiple revisions.  Patient is able to walk with walker, still some weakness, has not had PT yet. No fever, no chills. Will still have left hip pain with walking.   Home health is not involved but he wants to get home health PT from Dr. Clarice Pole office.   He states he has had severe right foot pain- take antiinflammatory that helps some. He has pain in the right large toe/foot, has seen Dr. Lucia Gaskins in the past with Xray. No redness, warmth, swelling.   He has been having GERD for 15 years. He has repeated EGD with dilatations, last one was 11/17/2018.  He is on protonix 40 mg BID.  He had barium swallow 2018 He states he was having issues with trouble with swallowing anything, including water most recently, but has issues with pills, chicken, bread. Undigested, comes up immediately.    FINDINGS: Mild decrease in esophageal peristalsis. Small hiatal hernia without reflux. Negative for stricture or mass. Mild narrowing at the GE junction did not delayed passage of a barium tablet into the stomach. Negative for gastroesophageal reflux  IMPRESSION: Small hiatal hernia.  Negative for stricture or reflux.  Home health is not involved.   BMI is Body mass index is 35.02 kg/m., he is working on diet and exercise. Wt  Readings from Last 3 Encounters:  09/29/19 (!) 251 lb (113.9 kg)  09/07/19 253 lb 8.5 oz (115 kg)  05/25/19 257 lb (116.6 kg)   Images while in the hospital: MR THORACIC SPINE WO CONTRAST  Result Date: 09/05/2019 CLINICAL DATA:  Chronic pain with difficulty ambulating. EXAM: MRI THORACIC SPINE WITHOUT CONTRAST TECHNIQUE: Multiplanar, multisequence MR imaging of the thoracic  spine was performed. No intravenous contrast was administered. COMPARISON:  None. FINDINGS: Alignment:  Exaggerated thoracic kyphosis. Vertebrae: No fracture, evidence of discitis, or bone lesion. Cord:  Normal signal and morphology. Paraspinal and other soft tissues: Partially covered right renal cystic intensity Disc levels: Diffuse disc narrowing and mild to moderate spondylitic spurring. There are central disc protrusions at T5-6, T6-7, T7-8, and T8-9 which contact and deform the ventral cord on axial slices. No complete effacement of CSF. Overall mild facet spurring. IMPRESSION: 1. No acute finding. 2. Disc degeneration mainly in the midthoracic spine where herniations contact cord from T5-6 to T8-9 3. No high-grade canal or foraminal stenosis. Electronically Signed   By: Monte Fantasia M.D.   On: 09/05/2019 08:10   MR LUMBAR SPINE WO CONTRAST  Result Date: 09/05/2019 CLINICAL DATA:  Progressive low back pain with neurologic deficit EXAM: MRI LUMBAR SPINE WITHOUT CONTRAST TECHNIQUE: Multiplanar, multisequence MR imaging of the lumbar spine was performed. No intravenous contrast was administered. COMPARISON:  Lumbar myelogram 03/24/2018 FINDINGS: Segmentation:  Standard lumbar numbering Alignment:  Trace L5-S1 anterolisthesis. Vertebrae: No fracture, evidence of discitis, or bone lesion. Solid arthrodesis at L2-3. Conus medullaris and cauda equina: Conus extends to the T12-L1 level. Conus and cauda equina appear normal. Paraspinal and other soft tissues: No acute finding. Renal cysts and colonic diverticulosis. Disc levels: T12- L1: Spondylosis.  No impingement L1-L2: Spondylosis and posterior element hypertrophy. No neural impingement L2-L3: Interval PLIF with solid arthrodesis. No residual impingement L3-L4: Prominent facet degeneration with spurring and marked ligamentum flavum thickening. The disc is bulging with a broad central protrusion based on sagittal images. Advanced spinal stenosis with complete  effacement of CSF. The foramina are patent L4-L5: Advanced facet osteoarthritis with bulky bony and ligamentous hypertrophy. Moderate spinal stenosis, especially at the subarticular recesses L5-S1:Degenerative facet spurring which is bulky. Negative disc space. No impingement IMPRESSION: 1. L3-4 interval adjacent segment degeneration with advanced spinal stenosis. 2. L4-5 moderate spinal stenosis primarily from posterior element hypertrophy. 3. L2-3 interval PLIF with solid arthrodesis and no residual impingement. Electronically Signed   By: Monte Fantasia M.D.   On: 09/05/2019 07:52   DG Hip Unilat With Pelvis 2-3 Views Left  Result Date: 09/05/2019 CLINICAL DATA:  Hip pain since replacement in 1998, now acutely worsening with difficulty bearing weight EXAM: DG HIP (WITH OR WITHOUT PELVIS) 2-3V LEFT COMPARISON:  Radiograph 05/02/2017, CT 03/23/2017 FINDINGS: Postsurgical changes from prior total left hip arthroplasty secured by a collared femoral stem as well as a side plate and cerclage wire fixation construct. There is extensive periprosthetic lucency about the proximal femoral stem and neck as well as increasing lucency through the inferior aspect of the greater trochanter. Could reflect progressive hardware loosening. Mild lucency along the inferior aspect of the femoral plate and screw construct is also new since 2019. No acute periprosthetic fracture is evident. Remaining bones of the pelvis appear intact and congruent. Advanced arthrosis at the right hip is noted with large periacetabular osteophytes and stable ossifications. Further degenerative changes in the lower lumbar spine, SI joints and symphysis pubis. Soft tissues are unremarkable. IMPRESSION: 1. Extensive  periprosthetic lucency about the proximal left femoral stem and neck as well as increasing lucency through the inferior aspect of the greater trochanter and at the distal extent of the side plate construct. Could reflect progressive hardware  loosening. Infection less favored in the absence of other septic features. 2. No acute periprosthetic fracture or other acute osseous abnormality. Electronically Signed   By: Lovena Le M.D.   On: 09/05/2019 03:26      Current Outpatient Medications (Cardiovascular):  .  bisoprolol-hydrochlorothiazide (ZIAC) 5-6.25 MG tablet, Take 1 tablet Daily for BP (Patient taking differently: Take 1 tablet by mouth in the morning. Take 1 tablet Daily for BP) .  fenofibrate (TRICOR) 145 MG tablet, Take 1 tablet Daily for Triglycerides (Blood Fats) (Patient taking differently: Take 145 mg by mouth daily. For triglycerides (blood fats)) .  losartan (COZAAR) 100 MG tablet, TAKE 1 TABLET BY MOUTH DAILY FOR BLOOD PRESSURE (Patient taking differently: Take 100 mg by mouth every evening. ) .  sildenafil (VIAGRA) 100 MG tablet, Take 100 mg by mouth daily as needed for erectile dysfunction.   Current Outpatient Medications (Analgesics):  .  aspirin EC 81 MG tablet, Take 81 mg by mouth daily. .  meloxicam (MOBIC) 15 MG tablet, TAKE 1/2 TO 1 TABLET DAILY WITH FOOD FOR PAIN AND INFLAMMATION (Patient taking differently: Take 7.5-15 mg by mouth daily. ) .  oxyCODONE-acetaminophen (PERCOCET/ROXICET) 5-325 MG tablet, Take 1-2 tablets by mouth every 3 (three) hours as needed for moderate pain or severe pain.  Current Outpatient Medications (Hematological):  .  vitamin B-12 (CYANOCOBALAMIN) 1000 MCG tablet, Take 1 tablet (1,000 mcg total) by mouth daily.  Current Outpatient Medications (Other):  .  b complex vitamins tablet, Take 1 tablet by mouth daily. .  Cholecalciferol (VITAMIN D PO), Take 5,000 Units by mouth daily.  .  finasteride (PROSCAR) 5 MG tablet, Take 5 mg by mouth daily. .  Flaxseed, Linseed, (FLAX SEEDS PO), Take 1,000 mg by mouth daily.  .  hyoscyamine (LEVSIN SL) 0.125 MG SL tablet, Place 0.125 mg under the tongue every 4 (four) hours as needed. .  Magnesium Oxide 250 MG TABS, Take 250 mg by mouth 2  (two) times daily.  .  Omega-3 Fatty Acids (FISH OIL) 1200 MG CAPS, Take 1,200 mg by mouth 2 (two) times daily.  .  pantoprazole (PROTONIX) 40 MG tablet, Take 40 mg by mouth 2 (two) times daily. .  tamsulosin (FLOMAX) 0.4 MG CAPS capsule, Take 0.4 mg by mouth daily after breakfast.  .  diclofenac Sodium (VOLTAREN) 1 % GEL, Apply 4 g topically 4 (four) times daily.  Past Medical History:  Diagnosis Date  . Arthritis    in left hip  . ED (erectile dysfunction)   . Enlarged prostate with lower urinary tract symptoms (LUTS)   . GERD (gastroesophageal reflux disease)   . History of chronic gastritis   . History of colon polyps    hyperplastic 2006  . History of esophageal stricture    S/P  DILATATION 2009; 2010; 2010 2012  . History of kidney stones    hx. multiple kidney stones  . History of kidney stones   . History of primary hyperparathyroidism    s/p  left superior parathyroidectomy 07-10-2014  . Hyperlipidemia   . Hypertension   . Ileus ([place]) 05/02/2017  . Left ureteral stone   . Pre-diabetes   . Sepsis secondary to UTI ([place]) 05/02/2017     Allergies  Allergen Reactions  . Iodinated Diagnostic  Agents Shortness Of Breath  . Iohexol Shortness Of Breath    ROS: all negative except above.   Physical Exam: Filed Weights   09/29/19 1110  Weight: (!) 251 lb (113.9 kg)   Wt (!) 251 lb (113.9 kg)   BMI 35.02 kg/m  General Appearance:Well sounding, in no apparent distress.  ENT/Mouth: No hoarseness, No cough for duration of visit.  Respiratory: completing full sentences without distress, without audible wheeze Neuro: Awake and oriented X 3,  Psych:  Insight and Judgment appropriate.    Vicie Mutters, PA-C 12:16 PM [facility]

## 2019-09-29 ENCOUNTER — Ambulatory Visit: Payer: Medicare Other | Admitting: Physician Assistant

## 2019-09-29 ENCOUNTER — Encounter: Payer: Self-pay | Admitting: Physician Assistant

## 2019-09-29 ENCOUNTER — Other Ambulatory Visit: Payer: Self-pay

## 2019-09-29 VITALS — Wt 251.0 lb

## 2019-09-29 DIAGNOSIS — M79671 Pain in right foot: Secondary | ICD-10-CM

## 2019-09-29 DIAGNOSIS — R4702 Dysphasia: Secondary | ICD-10-CM

## 2019-09-29 DIAGNOSIS — I509 Heart failure, unspecified: Secondary | ICD-10-CM | POA: Diagnosis not present

## 2019-09-29 DIAGNOSIS — M48061 Spinal stenosis, lumbar region without neurogenic claudication: Secondary | ICD-10-CM | POA: Diagnosis not present

## 2019-09-29 MED ORDER — DICLOFENAC SODIUM 1 % EX GEL: 4.0000 g | Freq: Four times a day (QID) | CUTANEOUS | 3 refills | Status: DC

## 2019-09-29 NOTE — Patient Instructions (Addendum)
Wear very rigid shoe Do not walk bare foot Will send in voltern gel RX but if this is too much cost go get from over the counter Can freeze water bottle and roll foot on it.   Try to avoid taking the mobic  Continue the protonix twice a day and we will try to figure who Dr. Ellene Route sees for GI and can refer   Achalasia  Achalasia is a disorder that affects the part of the body that moves food from your mouth to your stomach (esophagus). This disorder makes it difficult for your esophagus to move liquids and food into your stomach. This makes it harder for you to swallow. Achalasia affects the muscle between your esophagus and your stomach (lower esophageal sphincter, LES). Normally, this muscle relaxes after you swallow. With achalasia, it does not relax, and there is increased pressure in this area. What are the causes? The cause of this condition is not known. What increases the risk? You may be more likely to develop this condition if:  You are 28-92 years old.  You have a family history of achalasia.  You have an autoimmune disease, such as: ? Diabetes. ? Hypothyroidism. ? Sjgren syndrome. ? Systemic lupus erythematosus. What are the signs or symptoms? Symptoms of this condition include:  Difficulty swallowing or pain with swallowing.  Food moving up from your stomach to your mouth (regurgitation).  Weight loss.  Coughing or wheezing.  Heartburn.  Difficulty burping.  Vomiting.  Bad breath (halitosis).  Chest pain. How is this diagnosed? This condition may be diagnosed based on:  Your symptoms, your medical history, and a physical exam.  Tests, such as: ? X-rays. ? Barium swallow study. In this test, X-rays are taken of your esophagus after you swallow a white liquid (barium) that shows up well on X-rays. ? Endoscopy. In this test, your health care provider looks at your esophagus using a small, flexible tube that has a video camera at the end  (endoscope). ? Esophageal manometry. This test checks to see if increased pressure is affecting your LES. How is this treated? There is no cure for this condition, but treatment can help to manage your symptoms. Treatment may include:  Medicines.  Stretching or widening (dilating) your LES. This is done with an endoscopy procedure that involves using a balloon to stretch out the lower esophagus.  Surgery to reduce pressure in your LES. This may be done with an endoscopy procedure that involves cutting or removing thickened muscle in the area.  An injection of a muscle-relaxing medicine (botulinum toxin) into the LES. Follow these instructions at home: Eating and drinking  Do not eat or drink while you are reclined or lying down.  Eat your food slowly.  Cut food into small pieces.  Try to eat soft food that is easier to swallow. General instructions  Take over-the-counter and prescription medicines only as told by your health care provider.  Do not use any products that contain nicotine or tobacco, such as cigarettes and e-cigarettes. If you need help quitting, ask your health care provider.  Keep all follow-up visits as told by your health care provider. This is important. Contact a health care provider if:  Your symptoms do not go away after treatment.  You have a fever.  You have any new symptoms.  Your pain is worse. Get help right away if:  You vomit blood.  You have chest pain.  You have difficulty breathing.  You develop a swelled (distended) abdomen.  Summary  Achalasia is a disorder that affects the part of the body that moves food from your mouth to your stomach (esophagus). It also affects the muscle between your esophagus and your stomach (lower esophageal sphincter, LES).  Achalasia may cause difficulty swallowing or pain with swallowing.  The cause of this condition is not known.  There is no cure for achalasia, but treatment can help to manage your  symptoms. This information is not intended to replace advice given to you by your health care provider. Make sure you discuss any questions you have with your health care provider. Document Revised: 01/29/2017 Document Reviewed: 12/11/2016 Elsevier Patient Education  2020 [employer].

## 2019-10-03 ENCOUNTER — Other Ambulatory Visit: Payer: Self-pay | Admitting: *Deleted

## 2019-10-03 NOTE — Patient Outreach (Signed)
[facility]) Care Management  10/03/2019  [name] [name] 10-09-1940 [id number]  Telephone outreach for care coordination.  Mr. Marrow has still not heard back from Dr. Clarice Pole office regarding his PT referral status. Advised I will call and report back to him.  Called Dr. Clarice Pole office and left a message on Cynthia's voice mail (Dr. Lenore Manner) to verify status of PT referral.  If continues to be unavailable will ask to order outpt PT and will assist pt with transportation to attend.  Eulah Pont. Myrtie Neither, MSN, [facility] Gerontological Nurse Practitioner [facility] Care Management [phone]

## 2019-10-06 ENCOUNTER — Other Ambulatory Visit: Payer: Self-pay | Admitting: Gastroenterology

## 2019-10-06 DIAGNOSIS — I130 Hypertensive heart and chronic kidney disease with heart failure and stage 1 through stage 4 chronic kidney disease, or unspecified chronic kidney disease: Secondary | ICD-10-CM | POA: Diagnosis not present

## 2019-10-06 DIAGNOSIS — R2681 Unsteadiness on feet: Secondary | ICD-10-CM | POA: Diagnosis not present

## 2019-10-06 DIAGNOSIS — R4702 Dysphasia: Secondary | ICD-10-CM

## 2019-10-06 DIAGNOSIS — N183 Chronic kidney disease, stage 3 unspecified: Secondary | ICD-10-CM | POA: Diagnosis not present

## 2019-10-06 DIAGNOSIS — Z4789 Encounter for other orthopedic aftercare: Secondary | ICD-10-CM | POA: Diagnosis not present

## 2019-10-06 DIAGNOSIS — I509 Heart failure, unspecified: Secondary | ICD-10-CM | POA: Diagnosis not present

## 2019-10-10 DIAGNOSIS — Z4789 Encounter for other orthopedic aftercare: Secondary | ICD-10-CM | POA: Diagnosis not present

## 2019-10-10 DIAGNOSIS — N183 Chronic kidney disease, stage 3 unspecified: Secondary | ICD-10-CM | POA: Diagnosis not present

## 2019-10-10 DIAGNOSIS — I130 Hypertensive heart and chronic kidney disease with heart failure and stage 1 through stage 4 chronic kidney disease, or unspecified chronic kidney disease: Secondary | ICD-10-CM | POA: Diagnosis not present

## 2019-10-10 DIAGNOSIS — I509 Heart failure, unspecified: Secondary | ICD-10-CM | POA: Diagnosis not present

## 2019-10-10 DIAGNOSIS — R2681 Unsteadiness on feet: Secondary | ICD-10-CM | POA: Diagnosis not present

## 2019-10-11 ENCOUNTER — Encounter: Payer: Self-pay | Admitting: *Deleted

## 2019-10-11 NOTE — Patient Outreach (Signed)
[facility]) Care Management  10/11/2019  SAYEED WEATHERALL 04/30/1940 [id number]  Received call from Janett Billow at Dr. Clarice Pole office returning my call. She advised that [facility] had been sent a referral for pt's PT. She did not know if they were able to provide the service.  Called [facility] and was able to verify that Mr. Buchheit is active with them for PT and his first face to face home visit was 10/06/19.  Emmi Red Flag concern resolved. Successful outreach letter sent.  Eulah Pont. Myrtie Neither, MSN, [facility] Gerontological Nurse Practitioner [facility] Care Management [phone]

## 2019-10-12 DIAGNOSIS — I509 Heart failure, unspecified: Secondary | ICD-10-CM | POA: Diagnosis not present

## 2019-10-12 DIAGNOSIS — N183 Chronic kidney disease, stage 3 unspecified: Secondary | ICD-10-CM | POA: Diagnosis not present

## 2019-10-12 DIAGNOSIS — I130 Hypertensive heart and chronic kidney disease with heart failure and stage 1 through stage 4 chronic kidney disease, or unspecified chronic kidney disease: Secondary | ICD-10-CM | POA: Diagnosis not present

## 2019-10-12 DIAGNOSIS — R2681 Unsteadiness on feet: Secondary | ICD-10-CM | POA: Diagnosis not present

## 2019-10-12 DIAGNOSIS — Z4789 Encounter for other orthopedic aftercare: Secondary | ICD-10-CM | POA: Diagnosis not present

## 2019-10-17 DIAGNOSIS — I509 Heart failure, unspecified: Secondary | ICD-10-CM | POA: Diagnosis not present

## 2019-10-17 DIAGNOSIS — I130 Hypertensive heart and chronic kidney disease with heart failure and stage 1 through stage 4 chronic kidney disease, or unspecified chronic kidney disease: Secondary | ICD-10-CM | POA: Diagnosis not present

## 2019-10-17 DIAGNOSIS — N183 Chronic kidney disease, stage 3 unspecified: Secondary | ICD-10-CM | POA: Diagnosis not present

## 2019-10-17 DIAGNOSIS — Z4789 Encounter for other orthopedic aftercare: Secondary | ICD-10-CM | POA: Diagnosis not present

## 2019-10-17 DIAGNOSIS — R2681 Unsteadiness on feet: Secondary | ICD-10-CM | POA: Diagnosis not present

## 2019-10-19 DIAGNOSIS — Z4789 Encounter for other orthopedic aftercare: Secondary | ICD-10-CM | POA: Diagnosis not present

## 2019-10-19 DIAGNOSIS — N183 Chronic kidney disease, stage 3 unspecified: Secondary | ICD-10-CM | POA: Diagnosis not present

## 2019-10-19 DIAGNOSIS — I509 Heart failure, unspecified: Secondary | ICD-10-CM | POA: Diagnosis not present

## 2019-10-19 DIAGNOSIS — I130 Hypertensive heart and chronic kidney disease with heart failure and stage 1 through stage 4 chronic kidney disease, or unspecified chronic kidney disease: Secondary | ICD-10-CM | POA: Diagnosis not present

## 2019-10-19 DIAGNOSIS — R2681 Unsteadiness on feet: Secondary | ICD-10-CM | POA: Diagnosis not present

## 2019-10-26 DIAGNOSIS — N183 Chronic kidney disease, stage 3 unspecified: Secondary | ICD-10-CM | POA: Diagnosis not present

## 2019-10-26 DIAGNOSIS — I130 Hypertensive heart and chronic kidney disease with heart failure and stage 1 through stage 4 chronic kidney disease, or unspecified chronic kidney disease: Secondary | ICD-10-CM | POA: Diagnosis not present

## 2019-10-26 DIAGNOSIS — R2681 Unsteadiness on feet: Secondary | ICD-10-CM | POA: Diagnosis not present

## 2019-10-26 DIAGNOSIS — I509 Heart failure, unspecified: Secondary | ICD-10-CM | POA: Diagnosis not present

## 2019-10-26 DIAGNOSIS — Z4789 Encounter for other orthopedic aftercare: Secondary | ICD-10-CM | POA: Diagnosis not present

## 2019-10-27 DIAGNOSIS — M48062 Spinal stenosis, lumbar region with neurogenic claudication: Secondary | ICD-10-CM | POA: Diagnosis not present

## 2019-10-30 NOTE — Progress Notes (Deleted)
MEDICARE ANNUAL WELLNESS VISIT AND FOLLOW UP Assessment:    Medicare annual wellness visit, subsequent   Essential hypertension - continue medications, DASH diet, exercise and monitor at home. Call if greater than 130/80.  - CBC with Differential/Platelet - CMP/GFR - TSH   Morbid obesity, unspecified obesity type ([place]) Long discussion about weight loss, diet, and exercise Discussed final goal weight  Patient didn't tolerate topamax, willing to retry phentermine; continue close follow up. Return in 3 months HAS NOT HAD SLEEP STUDY YET BUT VERY LIKELY HAS OSA GET SLEEP STUDY - declines at this time, has been too busy with hip surgery  Hyperparathyroidism, primary ([place]) - CMP/GFR  Prediabetes/borderline diabetic Working aggressively on weight loss  Discussed disease and risks Discussed diet/exercise, weight management  - Hemoglobin A1c  Hyperlipidemia -continue medications, check lipids, decrease fatty foods, increase activity.  - Lipid panel  Medication management - Magnesium  Vitamin D deficiency - VITAMIN D 25 Hydroxy (Vit-D Deficiency, Fractures)   Kidney stones Lifestyle discussed, push fluids  CMP/GFR   Stricture and stenosis of esophagus Continue PPI  Gastroesophageal reflux disease, esophagitis presence not specified Continue PPI  CKD (chronic kidney disease) stage 3, GFR 30-59 ml/min Increase fluids, avoid NSAIDS, monitor sugars, will monitor - CMP/GFR  Hip osteoarthritis S/p L hip arthroplasty, follow up ortho PRN, continue PT and exercises  Over 30 minutes of exam, counseling, chart review, and critical decision making was performed  Future Appointments  Date Time Provider [place]  11/01/2019  9:00 AM Vicie Mutters, PA-C [facility] None  03/04/2020  2:00 PM Unk Pinto, MD [facility] None     Plan:   During the course of the visit the patient was educated and counseled about appropriate screening and preventive services including:     Pneumococcal vaccine   Influenza vaccine  Prevnar 13  Td vaccine  Screening electrocardiogram  Colorectal cancer screening  Diabetes screening  Glaucoma screening  Nutrition counseling    Subjective:  [name] [name] is a 79 y.o. male who presents for Medicare Annual Wellness Visit.  He has had multiple surgeries on L hip for failed arthroplasty in the past 1-2 years, most recently he had Lumbar spinal stenosis L3-4 and L4-5 with paraparesis.  BMI is There is no height or weight on file to calculate BMI., he is working on diet and exercise. He reports quantity is very limited, but admits to high sugar/starch intake - 1/2 bagel with jam, glass of juice, etc. Has tried and failed phentermine and topamax in the past.  Wt Readings from Last 3 Encounters:  09/29/19 (!) 251 lb (113.9 kg)  09/07/19 253 lb 8.5 oz (115 kg)  05/25/19 257 lb (116.6 kg)   His blood pressure has been controlled at home, today their BP is   He does not workout. He denies chest pain, shortness of breath, dizziness.   Has chronic GERD, on PPI, has had esophagus stretched 3 times. Still having dysphagia with bread, meats, most recently water.  Had cystoscopy for stone/stent placement in Oct 2018 by Dr. Diona Fanti  He is on cholesterol medication and has some myalgias with fenofibrate. His cholesterol is at goal. The cholesterol last visit was:   Lab Results  Component Value Date   CHOL 161 02/06/2019   HDL 46 02/06/2019   LDLCALC 95 02/06/2019   TRIG 104 02/06/2019   CHOLHDL 3.5 02/06/2019   He has been working on diet and exercise for prediabetes/T2DM, and denies paresthesia of the feet, polydipsia, polyuria and visual  disturbances. Last A1C in the office was:  Lab Results  Component Value Date   HGBA1C 6.6 (H) 09/06/2019   Last GFR  Lab Results  Component Value Date   GFRNONAA 51 (L) 09/15/2019   Patient is on Vitamin D supplement.   Lab Results  Component Value Date   VD25OH 89.91  09/05/2019        Medication Review:   Current Outpatient Medications (Cardiovascular):  .  bisoprolol-hydrochlorothiazide (ZIAC) 5-6.25 MG tablet, Take 1 tablet Daily for BP (Patient taking differently: Take 1 tablet by mouth in the morning. Take 1 tablet Daily for BP) .  fenofibrate (TRICOR) 145 MG tablet, Take 1 tablet Daily for Triglycerides (Blood Fats) (Patient taking differently: Take 145 mg by mouth daily. For triglycerides (blood fats)) .  losartan (COZAAR) 100 MG tablet, TAKE 1 TABLET BY MOUTH DAILY FOR BLOOD PRESSURE (Patient taking differently: Take 100 mg by mouth every evening. ) .  sildenafil (VIAGRA) 100 MG tablet, Take 100 mg by mouth daily as needed for erectile dysfunction.   Current Outpatient Medications (Analgesics):  .  aspirin EC 81 MG tablet, Take 81 mg by mouth daily. .  meloxicam (MOBIC) 15 MG tablet, TAKE 1/2 TO 1 TABLET DAILY WITH FOOD FOR PAIN AND INFLAMMATION (Patient taking differently: Take 7.5-15 mg by mouth daily. ) .  oxyCODONE-acetaminophen (PERCOCET/ROXICET) 5-325 MG tablet, Take 1-2 tablets by mouth every 3 (three) hours as needed for moderate pain or severe pain.  Current Outpatient Medications (Hematological):  .  vitamin B-12 (CYANOCOBALAMIN) 1000 MCG tablet, Take 1 tablet (1,000 mcg total) by mouth daily.  Current Outpatient Medications (Other):  .  b complex vitamins tablet, Take 1 tablet by mouth daily. .  Cholecalciferol (VITAMIN D PO), Take 5,000 Units by mouth daily.  .  diclofenac Sodium (VOLTAREN) 1 % GEL, Apply 4 g topically 4 (four) times daily. .  finasteride (PROSCAR) 5 MG tablet, Take 5 mg by mouth daily. .  Flaxseed, Linseed, (FLAX SEEDS PO), Take 1,000 mg by mouth daily.  .  hyoscyamine (LEVSIN SL) 0.125 MG SL tablet, PLACE 1 TABLET UNDER THE TONGUE DAILY AS NEEDED. .  Magnesium Oxide 250 MG TABS, Take 250 mg by mouth 2 (two) times daily.  .  Omega-3 Fatty Acids (FISH OIL) 1200 MG CAPS, Take 1,200 mg by mouth 2 (two) times daily.   .  pantoprazole (PROTONIX) 40 MG tablet, TAKE 1 TABLET BY MOUTH TWICE A DAY. .  tamsulosin (FLOMAX) 0.4 MG CAPS capsule, Take 0.4 mg by mouth daily after breakfast.   Current Problems (verified) Patient Active Problem List   Diagnosis Date Noted  . Chronic congestive heart failure, unspecified heart failure type ([place]) 09/11/2019  . Hyperlipidemia associated with type 2 diabetes mellitus ([place]) 09/11/2019  . Spinal stenosis 09/05/2019  . Abnormal glucose 08/11/2019  . Herniated nucleus pulposus, L2-3 left 11/09/2017  . Normocytic anemia 05/02/2017  . Vitamin B12 deficiency 05/02/2017  . OA (osteoarthritis) of hip 06/24/2016  . CKD (chronic kidney disease) stage 3, GFR 30-59 ml/min ([facility]) 03/25/2015  . Snoring 03/25/2015  . Encounter for Medicare annual wellness exam 11/27/2014  . Hyperparathyroidism, primary ([place]) 07/09/2014  . Vitamin D deficiency 06/13/2013  . Medication management 06/13/2013  . Hyperlipidemia   . Hypertension   . GERD (gastroesophageal reflux disease)   . Kidney stones   . Morbidly obese ([place])   . Stricture and stenosis of esophagus 10/18/2008    Screening Tests Immunization History  Administered Date(s) Administered  . Influenza Split  12/27/2012  . Influenza, High Dose Seasonal PF 01/31/2014, 11/27/2014, 01/13/2018  . Influenza-Unspecified 01/12/2017  . Pneumococcal Conjugate-13 03/25/2015  . Pneumococcal Polysaccharide-23 03/02/2008  . Td 03/02/2005, 09/24/2015  . Zoster 03/02/2006   Preventative care: Last colonoscopy: 01/2017 EGD 2012 MGM 2013  Prior vaccinations: TD or Tdap: 2017 Influenza: 2019  Pneumococcal: 2010 Prevnar13: 2017 Shingles/Zostavax: 2008  Names of Other Physician/Practitioners you currently use: 1. [facility] here for primary care 2. Dr. Katy Fitch, eye doctor, last visit 2019, has upcoming cataract  3. Dr. Altamese [place], dentist, last visit 2019  Patient Care Team: Unk Pinto, MD as PCP -  General (Internal Medicine) Newt Minion, MD as Consulting Physician (Orthopedic Surgery) Inda Castle, MD (Inactive) as Consulting Physician (Gastroenterology) Franchot Gallo, MD as Consulting Physician (Urology) Deloria Lair, NP as [place] Management  Allergies Allergies  Allergen Reactions  . Iodinated Diagnostic Agents Shortness Of Breath  . Iohexol Shortness Of Breath    SURGICAL HISTORY He  has a past surgical history that includes tumor ear (Left, age 23); Cystoscopy w/ ureteroscopy w/ lithotripsy (Left, 05/04/2005); Colonoscopy (02/17/2005); Nephrolithotomy (Left, 02/01/2014); Parathyroidectomy (Left, 07/10/2014); CYSTO/  LEFT RETROGRADE PYELOGRAM/ STENT PLACEMENT (01/26/2005); Esophagogastroduodenoscopy (egd) with esophageal dilation (x4  last one 07-21-2010); Inguinal hernia repair (Left, yrs ago); Cystoscopy/retrograde/ureteroscopy/stone extraction with basket (Left, 12/30/2015); Cystoscopy with stent placement (Left, 12/30/2015); Holmium laser application (Left, 46/50/3546); Cystoscopy/ureteroscopy/holmium laser/stent placement (Left, 03/09/2016); Total hip arthroplasty (Left, 06/24/2016); Total hip revision (Left, 11/18/2016); ORIF hip fracture (Left, 04/16/2017); Lumbar laminectomy/decompression microdiscectomy (Left, 11/09/2017); and Back surgery (04/2018). FAMILY HISTORY His family history includes Alzheimer's disease in his mother; Bladder Cancer in his sister; Heart disease in his father and mother. SOCIAL HISTORY He  reports that he quit smoking about 45 years ago. He quit after 8.00 years of use. He has never used smokeless tobacco. He reports current alcohol use of about 1.0 - 2.0 standard drink of alcohol per week. He reports that he does not use drugs.  MEDICARE WELLNESS OBJECTIVES: Physical activity:   Cardiac risk factors:   Depression/mood screen:   Depression screen [facility] 2/9 02/05/2019  Decreased Interest 0  Down, Depressed, Hopeless 0   PHQ - 2 Score 0    ADLs:  In your present state of health, do you have any difficulty performing the following activities: 09/05/2019 09/05/2019  Hearing? N N  Vision? N N  Difficulty concentrating or making decisions? N N  Walking or climbing stairs? Y Y  Dressing or bathing? N N  Doing errands, shopping? N N  Some recent data might be hidden     Cognitive Testing  Alert? Yes  Normal Appearance?Yes  Oriented to person? Yes  Place? Yes   Time? Yes  Recall of three objects?  Yes  Can perform simple calculations? Yes  Displays appropriate judgment?Yes  Can read the correct time from a watch face?Yes  EOL planning:     Objective:   There were no vitals filed for this visit. There is no height or weight on file to calculate BMI.  General appearance: alert, no distress, WD/WN, male HEENT: normocephalic, sclerae anicteric, TMs pearly, nares patent, no discharge or erythema, pharynx normal Oral cavity: MMM, no lesions, crowded mouth Neck: supple, large neck circumference, no lymphadenopathy, no thyromegaly, no masses Heart: RRR, normal S1, S2, no murmurs Lungs: CTA bilaterally, no wheezes, rhonchi, or rales Abdomen: +bs, soft, obese, non tender, non distended, no masses, no hepatomegaly, no splenomegaly Musculoskeletal: nontender, no swelling, no  obvious deformity, antalgic gait with cane Extremities: no edema, no cyanosis, no clubbing Pulses: 2+ symmetric, upper and lower extremities, normal cap refill Neurological: alert, oriented x 3, CN2-12 intact, strength normal upper extremities, weakness in bilateral lower extremities, sensation diminished in L thigh, no cerebellar signs, gait antalgic Psychiatric: normal affect, behavior normal, pleasant   Medicare Attestation I have personally reviewed: The patient's medical and social history Their use of alcohol, tobacco or illicit drugs Their current medications and supplements The patient's functional ability including ADLs,fall  risks, home safety risks, cognitive, and hearing and visual impairment Diet and physical activities Evidence for depression or mood disorders  The patient's weight, height, BMI, and visual acuity have been recorded in the chart.  I have made referrals, counseling, and provided education to the patient based on review of the above and I have provided the patient with a written personalized care plan for preventive services.     Vicie Mutters, PA-C   10/30/2019

## 2019-10-31 ENCOUNTER — Telehealth: Payer: Self-pay | Admitting: Gastroenterology

## 2019-10-31 DIAGNOSIS — N183 Chronic kidney disease, stage 3 unspecified: Secondary | ICD-10-CM | POA: Diagnosis not present

## 2019-10-31 DIAGNOSIS — Z4789 Encounter for other orthopedic aftercare: Secondary | ICD-10-CM | POA: Diagnosis not present

## 2019-10-31 DIAGNOSIS — I509 Heart failure, unspecified: Secondary | ICD-10-CM | POA: Diagnosis not present

## 2019-10-31 DIAGNOSIS — I130 Hypertensive heart and chronic kidney disease with heart failure and stage 1 through stage 4 chronic kidney disease, or unspecified chronic kidney disease: Secondary | ICD-10-CM | POA: Diagnosis not present

## 2019-10-31 DIAGNOSIS — R2681 Unsteadiness on feet: Secondary | ICD-10-CM | POA: Diagnosis not present

## 2019-10-31 DIAGNOSIS — M25552 Pain in left hip: Secondary | ICD-10-CM | POA: Diagnosis not present

## 2019-10-31 NOTE — Telephone Encounter (Signed)
Hi Dr. Silverio Decamp,  We received a referral from PCP for Dysphagia the patient is requesting to transfer care over to Dr. Fuller Plan.   Please advise on approval for scheduling.  Thank you

## 2019-10-31 NOTE — Telephone Encounter (Signed)
Sure, if Dr. Fuller Plan agrees  He had extensive work-up for evaluation of dysphagia, unrevealing for etiology.  Patient did not want to proceed with esophageal manometry for further evaluation of dysphagia.

## 2019-11-01 ENCOUNTER — Other Ambulatory Visit: Payer: Self-pay | Admitting: Orthopedic Surgery

## 2019-11-01 ENCOUNTER — Ambulatory Visit: Payer: Medicare Other | Admitting: Physician Assistant

## 2019-11-01 DIAGNOSIS — M25552 Pain in left hip: Secondary | ICD-10-CM

## 2019-11-01 NOTE — Telephone Encounter (Signed)
Hi Dr. Fuller Plan,  Patient is requesting a transfer of care please advise on approval for scheduling.  Thank you.

## 2019-11-01 NOTE — Telephone Encounter (Signed)
I am unable to accommodate an elective transfer of care at this time. Perhaps another physician in our group can accommodate.

## 2019-11-02 ENCOUNTER — Ambulatory Visit
Admission: RE | Admit: 2019-11-02 | Discharge: 2019-11-02 | Disposition: A | Discharge status: Home / Self Care | Payer: Medicare Other | Source: Ambulatory Visit | Attending: Orthopedic Surgery | Admitting: Orthopedic Surgery

## 2019-11-02 ENCOUNTER — Other Ambulatory Visit: Payer: Self-pay

## 2019-11-02 DIAGNOSIS — M25552 Pain in left hip: Secondary | ICD-10-CM

## 2019-11-02 MED ORDER — GADOBENATE DIMEGLUMINE 529 MG/ML IV SOLN
20.0000 mL | Freq: Once | INTRAVENOUS | Status: AC | PRN
  Administered 2019-11-02: 20 mL via INTRAVENOUS

## 2019-11-02 NOTE — Telephone Encounter (Signed)
Called patient to advise left voicemail. °

## 2019-11-03 NOTE — Telephone Encounter (Signed)
Pt returned call and was given Dr. Lynne Leader message. He was very understanding of the decision.

## 2019-11-08 DIAGNOSIS — R2681 Unsteadiness on feet: Secondary | ICD-10-CM | POA: Diagnosis not present

## 2019-11-08 DIAGNOSIS — N183 Chronic kidney disease, stage 3 unspecified: Secondary | ICD-10-CM | POA: Diagnosis not present

## 2019-11-08 DIAGNOSIS — I509 Heart failure, unspecified: Secondary | ICD-10-CM | POA: Diagnosis not present

## 2019-11-08 DIAGNOSIS — I130 Hypertensive heart and chronic kidney disease with heart failure and stage 1 through stage 4 chronic kidney disease, or unspecified chronic kidney disease: Secondary | ICD-10-CM | POA: Diagnosis not present

## 2019-11-08 DIAGNOSIS — Z4789 Encounter for other orthopedic aftercare: Secondary | ICD-10-CM | POA: Diagnosis not present

## 2019-11-09 ENCOUNTER — Ambulatory Visit: Payer: Self-pay | Admitting: Orthopedic Surgery

## 2019-11-09 DIAGNOSIS — M25552 Pain in left hip: Secondary | ICD-10-CM | POA: Diagnosis not present

## 2019-11-09 DIAGNOSIS — M25551 Pain in right hip: Secondary | ICD-10-CM | POA: Diagnosis not present

## 2019-11-14 DIAGNOSIS — R2681 Unsteadiness on feet: Secondary | ICD-10-CM | POA: Diagnosis not present

## 2019-11-14 DIAGNOSIS — I130 Hypertensive heart and chronic kidney disease with heart failure and stage 1 through stage 4 chronic kidney disease, or unspecified chronic kidney disease: Secondary | ICD-10-CM | POA: Diagnosis not present

## 2019-11-14 DIAGNOSIS — N183 Chronic kidney disease, stage 3 unspecified: Secondary | ICD-10-CM | POA: Diagnosis not present

## 2019-11-14 DIAGNOSIS — I509 Heart failure, unspecified: Secondary | ICD-10-CM | POA: Diagnosis not present

## 2019-11-14 DIAGNOSIS — Z4789 Encounter for other orthopedic aftercare: Secondary | ICD-10-CM | POA: Diagnosis not present

## 2019-11-15 ENCOUNTER — Other Ambulatory Visit (HOSPITAL_COMMUNITY): Payer: Self-pay | Admitting: Neurological Surgery

## 2019-11-15 DIAGNOSIS — M48062 Spinal stenosis, lumbar region with neurogenic claudication: Secondary | ICD-10-CM

## 2019-11-21 ENCOUNTER — Other Ambulatory Visit: Payer: Self-pay

## 2019-11-21 ENCOUNTER — Ambulatory Visit (HOSPITAL_COMMUNITY)
Admission: RE | Admit: 2019-11-21 | Discharge: 2019-11-21 | Disposition: A | Discharge status: Home / Self Care | Payer: Medicare Other | Source: Ambulatory Visit | Attending: Neurological Surgery | Admitting: Neurological Surgery

## 2019-11-21 DIAGNOSIS — M48062 Spinal stenosis, lumbar region with neurogenic claudication: Secondary | ICD-10-CM | POA: Diagnosis not present

## 2019-11-21 DIAGNOSIS — R2681 Unsteadiness on feet: Secondary | ICD-10-CM | POA: Diagnosis not present

## 2019-11-21 DIAGNOSIS — I130 Hypertensive heart and chronic kidney disease with heart failure and stage 1 through stage 4 chronic kidney disease, or unspecified chronic kidney disease: Secondary | ICD-10-CM | POA: Diagnosis not present

## 2019-11-21 DIAGNOSIS — S32009A Unspecified fracture of unspecified lumbar vertebra, initial encounter for closed fracture: Secondary | ICD-10-CM | POA: Diagnosis not present

## 2019-11-21 DIAGNOSIS — Z4789 Encounter for other orthopedic aftercare: Secondary | ICD-10-CM | POA: Diagnosis not present

## 2019-11-21 DIAGNOSIS — N183 Chronic kidney disease, stage 3 unspecified: Secondary | ICD-10-CM | POA: Diagnosis not present

## 2019-11-21 DIAGNOSIS — I509 Heart failure, unspecified: Secondary | ICD-10-CM | POA: Diagnosis not present

## 2019-11-23 ENCOUNTER — Other Ambulatory Visit: Payer: Medicare Other

## 2019-11-27 DIAGNOSIS — R2681 Unsteadiness on feet: Secondary | ICD-10-CM | POA: Diagnosis not present

## 2019-11-27 DIAGNOSIS — N183 Chronic kidney disease, stage 3 unspecified: Secondary | ICD-10-CM | POA: Diagnosis not present

## 2019-11-27 DIAGNOSIS — Z4789 Encounter for other orthopedic aftercare: Secondary | ICD-10-CM | POA: Diagnosis not present

## 2019-11-27 DIAGNOSIS — I509 Heart failure, unspecified: Secondary | ICD-10-CM | POA: Diagnosis not present

## 2019-11-27 DIAGNOSIS — I130 Hypertensive heart and chronic kidney disease with heart failure and stage 1 through stage 4 chronic kidney disease, or unspecified chronic kidney disease: Secondary | ICD-10-CM | POA: Diagnosis not present

## 2019-11-28 ENCOUNTER — Other Ambulatory Visit: Payer: Self-pay | Admitting: Neurological Surgery

## 2019-11-30 NOTE — Progress Notes (Signed)
Your procedure is scheduled on Tuesday, October 5th.  Report to [facility] Main Entrance "A" at 5:30 A.M., and check in at the Admitting office.  Call this number if you have problems the morning of surgery:  [phone]  Call [phone] if you have any questions prior to your surgery date Monday-Friday 8am-4pm   Remember:  Do not eat or drink after midnight the night before your surgery    Take these medicines the morning of surgery with A SIP OF WATER  bisoprolol-hydrochlorothiazide  fenofibrate (TRICOR) finasteride (PROSCAR) gabapentin (NEURONTIN) hyoscyamine (LEVSIN SL)      pantoprazole (PROTONIX) tamsulosin (FLOMAX)   If needed: oxyCODONE-acetaminophen (PERCOCET/ROXICET)   Follow your surgeon's instructions on when to stop Aspirin.  If no instructions were given by your surgeon then you will need to call the office to get those instructions.    As of today, STOP taking meloxicam (MOBIC),  Aleve, Naproxen, Ibuprofen, Motrin, Advil, Goody's, BC's, all herbal medications, fish oil, and all vitamins.              Do not wear jewelry.            Do not wear lotions, powders, colognes, or deodorant.            Men may shave face and neck.            Do not bring valuables to the hospital.            [facility] is not responsible for any belongings or valuables.  Do NOT Smoke (Tobacco/Vaping) or drink Alcohol 24 hours prior to your procedure If you use a CPAP at night, you may bring all equipment for your overnight stay.   Contacts, glasses, dentures or bridgework may not be worn into surgery.      For patients admitted to the hospital, discharge time will be determined by your treatment team.   Patients discharged the day of surgery will not be allowed to drive home, and someone needs to stay with them for 24 hours.  Special instructions:   [place]- Preparing For Surgery  Before surgery, you can play an important role. Because skin is not sterile, your skin needs  to be as free of germs as possible. You can reduce the number of germs on your skin by washing with CHG (chlorahexidine gluconate) Soap before surgery.  CHG is an antiseptic cleaner which kills germs and bonds with the skin to continue killing germs even after washing.    Oral Hygiene is also important to reduce your risk of infection.  Remember - BRUSH YOUR TEETH THE MORNING OF SURGERY WITH YOUR REGULAR TOOTHPASTE  Please do not use if you have an allergy to CHG or antibacterial soaps. If your skin becomes reddened/irritated stop using the CHG.  Do not shave (including legs and underarms) for at least 48 hours prior to first CHG shower. It is OK to shave your face.  Please follow these instructions carefully.   1. Shower the NIGHT BEFORE SURGERY and the MORNING OF SURGERY with CHG Soap.   2. If you chose to wash your hair, wash your hair first as usual with your normal shampoo.  3. After you shampoo, rinse your hair and body thoroughly to remove the shampoo.  4. Use CHG as you would any other liquid soap. You can apply CHG directly to the skin and wash gently with a scrungie or a clean washcloth.   5. Apply the CHG Soap to your body  ONLY FROM THE NECK DOWN.  Do not use on open wounds or open sores. Avoid contact with your eyes, ears, mouth and genitals (private parts). Wash Face and genitals (private parts)  with your normal soap.   6. Wash thoroughly, paying special attention to the area where your surgery will be performed.  7. Thoroughly rinse your body with warm water from the neck down.  8. DO NOT shower/wash with your normal soap after using and rinsing off the CHG Soap.  9. Pat yourself dry with a CLEAN TOWEL.  10. Wear CLEAN PAJAMAS to bed the night before surgery  11. Place CLEAN SHEETS on your bed the night of your first shower and DO NOT SLEEP WITH PETS.  Day of Surgery: Wear Clean/Comfortable clothing the morning of surgery Do not apply any deodorants/lotions.    Remember to brush your teeth WITH YOUR REGULAR TOOTHPASTE.   Please read over the following fact sheets that you were given.

## 2019-12-01 ENCOUNTER — Encounter (HOSPITAL_COMMUNITY)
Admission: RE | Admit: 2019-12-01 | Discharge: 2019-12-01 | Disposition: A | Discharge status: Home / Self Care | Payer: Medicare Other | Source: Ambulatory Visit | Attending: Neurological Surgery | Admitting: Neurological Surgery

## 2019-12-01 ENCOUNTER — Inpatient Hospital Stay (HOSPITAL_COMMUNITY): Admission: RE | Admit: 2019-12-01 | Payer: Medicare Other | Source: Ambulatory Visit

## 2019-12-01 ENCOUNTER — Other Ambulatory Visit: Payer: Self-pay

## 2019-12-01 ENCOUNTER — Other Ambulatory Visit (HOSPITAL_COMMUNITY)
Admission: RE | Admit: 2019-12-01 | Discharge: 2019-12-01 | Disposition: A | Discharge status: Home / Self Care | Payer: Medicare Other | Source: Ambulatory Visit | Attending: Neurological Surgery | Admitting: Neurological Surgery

## 2019-12-01 ENCOUNTER — Encounter (HOSPITAL_COMMUNITY): Payer: Self-pay

## 2019-12-01 DIAGNOSIS — Z20822 Contact with and (suspected) exposure to covid-19: Secondary | ICD-10-CM | POA: Diagnosis not present

## 2019-12-01 DIAGNOSIS — Z01812 Encounter for preprocedural laboratory examination: Secondary | ICD-10-CM | POA: Insufficient documentation

## 2019-12-01 LAB — SARS CORONAVIRUS 2 (TAT 6-24 HRS): SARS Coronavirus 2: NEGATIVE

## 2019-12-01 LAB — CBC
HCT: 38.4 % — ABNORMAL LOW (ref 39.0–52.0)
Hemoglobin: 11.4 g/dL — ABNORMAL LOW (ref 13.0–17.0)
MCH: 24.7 pg — ABNORMAL LOW (ref 26.0–34.0)
MCHC: 29.7 g/dL — ABNORMAL LOW (ref 30.0–36.0)
MCV: 83.1 fL (ref 80.0–100.0)
Platelets: 423 10*3/uL — ABNORMAL HIGH (ref 150–400)
RBC: 4.62 MIL/uL (ref 4.22–5.81)
RDW: 15.6 % — ABNORMAL HIGH (ref 11.5–15.5)
WBC: 8.4 10*3/uL (ref 4.0–10.5)
nRBC: 0 % (ref 0.0–0.2)

## 2019-12-01 LAB — BASIC METABOLIC PANEL
Anion gap: 12 (ref 5–15)
BUN: 21 mg/dL (ref 8–23)
CO2: 23 mmol/L (ref 22–32)
Calcium: 9.6 mg/dL (ref 8.9–10.3)
Chloride: 103 mmol/L (ref 98–111)
Creatinine, Ser: 1.41 mg/dL — ABNORMAL HIGH (ref 0.61–1.24)
GFR calc Af Amer: 56 mL/min — ABNORMAL LOW (ref >60)
GFR calc non Af Amer: 48 mL/min — ABNORMAL LOW (ref >60)
Glucose, Bld: 106 mg/dL — ABNORMAL HIGH (ref 70–99)
Potassium: 4.3 mmol/L (ref 3.5–5.1)
Sodium: 138 mmol/L (ref 135–145)

## 2019-12-01 LAB — TYPE AND SCREEN
ABO/RH(D): A POS
Antibody Screen: NEGATIVE

## 2019-12-01 LAB — SURGICAL PCR SCREEN
MRSA, PCR: NEGATIVE
Staphylococcus aureus: POSITIVE — AB

## 2019-12-01 LAB — GLUCOSE, CAPILLARY: Glucose-Capillary: 111 mg/dL — ABNORMAL HIGH (ref 70–99)

## 2019-12-01 NOTE — Progress Notes (Signed)
PCP - [place] [place] Cardiologist - na   Chest x-ray - na EKG - 02/06/19 Stress Test - yrs. Ago--normal ECHO - 05/02/17 Cardiac Cath - na  Sleep Study -  Screened positive CPAP - na  Fasting Blood Sugar - 124  (pre-diabetic) Checks Blood Sugar ___1_ times a wek  Blood Thinner Instructions:na Aspirin Instructions: pt. To call Dr. Clarice Pole office when to stop    COVID TEST-  12/01/19   Anesthesia review:   Patient denies shortness of breath, fever, cough and chest pain at PAT appointment   All instructions explained to the patient, with a verbal understanding of the material. Patient agrees to go over the instructions while at home for a better understanding. Patient also instructed to self quarantine after being tested for COVID-19. The opportunity to ask questions was provided.

## 2019-12-01 NOTE — Progress Notes (Signed)
   12/01/19 0920  OBSTRUCTIVE SLEEP APNEA  Have you ever been diagnosed with sleep apnea through a sleep study? No  Do you snore loudly (loud enough to be heard through closed doors)?  0  Do you often feel tired, fatigued, or sleepy during the daytime (such as falling asleep during driving or talking to someone)? 0  Has anyone observed you stop breathing during your sleep? 0  Do you have, or are you being treated for high blood pressure? 1  BMI more than 35 kg/m2? 1  Age > 50 (1-yes) 1  Neck circumference greater than:Male 16 inches or larger, Male 17inches or larger? 1  Male Gender (Yes=1) 1  Obstructive Sleep Apnea Score 5  Score 5 or greater  Results sent to PCP

## 2019-12-04 MED ORDER — DEXTROSE 5 % IV SOLN
3.0000 g | INTRAVENOUS | Status: AC
  Administered 2019-12-05: 3 g via INTRAVENOUS
  Filled 2019-12-04: qty 3

## 2019-12-04 NOTE — Anesthesia Preprocedure Evaluation (Addendum)
Anesthesia Evaluation  Patient identified by MRN, date of birth, ID band Patient awake    Reviewed: Allergy & Precautions, H&P , NPO status , Patient's Chart, lab work & pertinent test results  Airway Mallampati: III  TM Distance: >3 FB Neck ROM: Full    Dental no notable dental hx. (+) Teeth Intact, Dental Advisory Given   Pulmonary neg pulmonary ROS, former smoker,    Pulmonary exam normal breath sounds clear to auscultation       Cardiovascular Exercise Tolerance: Good hypertension, Pt. on medications  Rhythm:Regular Rate:Normal     Neuro/Psych negative neurological ROS  negative psych ROS   GI/Hepatic Neg liver ROS, GERD  ,  Endo/Other  Morbid obesity  Renal/GU Renal InsufficiencyRenal disease  negative genitourinary   Musculoskeletal  (+) Arthritis ,   Abdominal   Peds  Hematology  (+) Blood dyscrasia, anemia ,   Anesthesia Other Findings   Reproductive/Obstetrics negative OB ROS                            Anesthesia Physical Anesthesia Plan  ASA: III  Anesthesia Plan: General   Post-op Pain Management:    Induction: Intravenous  PONV Risk Score and Plan: 3 and Ondansetron, Dexamethasone and Midazolam  Airway Management Planned: Oral ETT  Additional Equipment: Arterial line  Intra-op Plan:   Post-operative Plan: Extubation in OR  Informed Consent: I have reviewed the patients History and Physical, chart, labs and discussed the procedure including the risks, benefits and alternatives for the proposed anesthesia with the patient or authorized representative who has indicated his/her understanding and acceptance.     Dental advisory given  Plan Discussed with: CRNA  Anesthesia Plan Comments:        Anesthesia Quick Evaluation

## 2019-12-05 ENCOUNTER — Encounter (HOSPITAL_COMMUNITY)
Admission: RE | Disposition: A | Discharge status: Home Health | Payer: Self-pay | Source: Home / Self Care | Attending: Neurological Surgery

## 2019-12-05 ENCOUNTER — Inpatient Hospital Stay (HOSPITAL_COMMUNITY): Payer: Medicare Other | Admitting: Anesthesiology

## 2019-12-05 ENCOUNTER — Other Ambulatory Visit: Payer: Self-pay

## 2019-12-05 ENCOUNTER — Inpatient Hospital Stay (HOSPITAL_COMMUNITY): Payer: Medicare Other | Admitting: Vascular Surgery

## 2019-12-05 ENCOUNTER — Inpatient Hospital Stay (HOSPITAL_COMMUNITY): Payer: Medicare Other

## 2019-12-05 ENCOUNTER — Encounter (HOSPITAL_COMMUNITY): Payer: Self-pay | Admitting: Neurological Surgery

## 2019-12-05 ENCOUNTER — Inpatient Hospital Stay (HOSPITAL_COMMUNITY)
Admission: RE | Admit: 2019-12-05 | Discharge: 2019-12-07 | DRG: 460 | Disposition: A | Discharge status: Home Health | Payer: Medicare Other | Attending: Neurological Surgery | Admitting: Neurological Surgery

## 2019-12-05 DIAGNOSIS — T85615A Breakdown (mechanical) of other nervous system device, implant or graft, initial encounter: Secondary | ICD-10-CM | POA: Diagnosis present

## 2019-12-05 DIAGNOSIS — Z87891 Personal history of nicotine dependence: Secondary | ICD-10-CM

## 2019-12-05 DIAGNOSIS — I130 Hypertensive heart and chronic kidney disease with heart failure and stage 1 through stage 4 chronic kidney disease, or unspecified chronic kidney disease: Secondary | ICD-10-CM | POA: Diagnosis not present

## 2019-12-05 DIAGNOSIS — Z791 Long term (current) use of non-steroidal anti-inflammatories (NSAID): Secondary | ICD-10-CM | POA: Diagnosis not present

## 2019-12-05 DIAGNOSIS — Z981 Arthrodesis status: Secondary | ICD-10-CM | POA: Diagnosis not present

## 2019-12-05 DIAGNOSIS — Z8249 Family history of ischemic heart disease and other diseases of the circulatory system: Secondary | ICD-10-CM

## 2019-12-05 DIAGNOSIS — T84226A Displacement of internal fixation device of vertebrae, initial encounter: Secondary | ICD-10-CM | POA: Diagnosis not present

## 2019-12-05 DIAGNOSIS — Z7982 Long term (current) use of aspirin: Secondary | ICD-10-CM | POA: Diagnosis not present

## 2019-12-05 DIAGNOSIS — I10 Essential (primary) hypertension: Secondary | ICD-10-CM | POA: Diagnosis not present

## 2019-12-05 DIAGNOSIS — K219 Gastro-esophageal reflux disease without esophagitis: Secondary | ICD-10-CM | POA: Diagnosis not present

## 2019-12-05 DIAGNOSIS — Y838 Other surgical procedures as the cause of abnormal reaction of the patient, or of later complication, without mention of misadventure at the time of the procedure: Secondary | ICD-10-CM | POA: Diagnosis present

## 2019-12-05 DIAGNOSIS — Z419 Encounter for procedure for purposes other than remedying health state, unspecified: Secondary | ICD-10-CM

## 2019-12-05 DIAGNOSIS — N183 Chronic kidney disease, stage 3 unspecified: Secondary | ICD-10-CM | POA: Diagnosis not present

## 2019-12-05 DIAGNOSIS — N400 Enlarged prostate without lower urinary tract symptoms: Secondary | ICD-10-CM | POA: Diagnosis present

## 2019-12-05 DIAGNOSIS — Z79899 Other long term (current) drug therapy: Secondary | ICD-10-CM

## 2019-12-05 DIAGNOSIS — Y831 Surgical operation with implant of artificial internal device as the cause of abnormal reaction of the patient, or of later complication, without mention of misadventure at the time of the procedure: Secondary | ICD-10-CM | POA: Diagnosis present

## 2019-12-05 DIAGNOSIS — M4800 Spinal stenosis, site unspecified: Secondary | ICD-10-CM

## 2019-12-05 DIAGNOSIS — M48062 Spinal stenosis, lumbar region with neurogenic claudication: Secondary | ICD-10-CM | POA: Diagnosis not present

## 2019-12-05 DIAGNOSIS — M5416 Radiculopathy, lumbar region: Secondary | ICD-10-CM | POA: Diagnosis present

## 2019-12-05 DIAGNOSIS — M4326 Fusion of spine, lumbar region: Secondary | ICD-10-CM | POA: Diagnosis not present

## 2019-12-05 DIAGNOSIS — M79671 Pain in right foot: Secondary | ICD-10-CM

## 2019-12-05 DIAGNOSIS — Z96642 Presence of left artificial hip joint: Secondary | ICD-10-CM | POA: Diagnosis present

## 2019-12-05 DIAGNOSIS — M4316 Spondylolisthesis, lumbar region: Secondary | ICD-10-CM | POA: Diagnosis present

## 2019-12-05 DIAGNOSIS — M5126 Other intervertebral disc displacement, lumbar region: Secondary | ICD-10-CM

## 2019-12-05 DIAGNOSIS — M5116 Intervertebral disc disorders with radiculopathy, lumbar region: Secondary | ICD-10-CM | POA: Diagnosis not present

## 2019-12-05 DIAGNOSIS — Z967 Presence of other bone and tendon implants: Secondary | ICD-10-CM

## 2019-12-05 DIAGNOSIS — I509 Heart failure, unspecified: Secondary | ICD-10-CM | POA: Diagnosis not present

## 2019-12-05 LAB — GLUCOSE, CAPILLARY
Glucose-Capillary: 145 mg/dL — ABNORMAL HIGH (ref 70–99)
Glucose-Capillary: 152 mg/dL — ABNORMAL HIGH (ref 70–99)
Glucose-Capillary: 144 mg/dL — ABNORMAL HIGH (ref 70–99)
Glucose-Capillary: 178 mg/dL — ABNORMAL HIGH (ref 70–99)
Glucose-Capillary: 205 mg/dL — ABNORMAL HIGH (ref 70–99)
Glucose-Capillary: 151 mg/dL — ABNORMAL HIGH (ref 70–99)

## 2019-12-05 LAB — POCT I-STAT 7, (LYTES, BLD GAS, ICA,H+H)
Acid-base deficit: 4 mmol/L — ABNORMAL HIGH (ref 0.0–2.0)
Bicarbonate: 23.1 mmol/L (ref 20.0–28.0)
Calcium, Ion: 1.15 mmol/L (ref 1.15–1.40)
HCT: 26 % — ABNORMAL LOW (ref 39.0–52.0)
Hemoglobin: 8.8 g/dL — ABNORMAL LOW (ref 13.0–17.0)
O2 Saturation: 99 %
Potassium: 3.9 mmol/L (ref 3.5–5.1)
Sodium: 142 mmol/L (ref 135–145)
TCO2: 25 mmol/L (ref 22–32)
pCO2 arterial: 48.9 mmHg — ABNORMAL HIGH (ref 32.0–48.0)
pH, Arterial: 7.282 — ABNORMAL LOW (ref 7.350–7.450)
pO2, Arterial: 165 mmHg — ABNORMAL HIGH (ref 83.0–108.0)

## 2019-12-05 LAB — HEMOGLOBIN A1C
Hgb A1c MFr Bld: 6.5 % — ABNORMAL HIGH (ref 4.8–5.6)
Mean Plasma Glucose: 139.85 mg/dL

## 2019-12-05 SURGERY — POSTERIOR LUMBAR FUSION 2 LEVEL
Anesthesia: General | Site: Back

## 2019-12-05 MED ORDER — PHENYLEPHRINE HCL-NACL 10-0.9 MG/250ML-% IV SOLN
INTRAVENOUS | Status: DC | PRN
  Administered 2019-12-05: 75 ug/min via INTRAVENOUS
  Administered 2019-12-05: 25 ug/min via INTRAVENOUS

## 2019-12-05 MED ORDER — ONDANSETRON HCL 4 MG/2ML IJ SOLN: 4.0000 mg | Freq: Four times a day (QID) | INTRAMUSCULAR | Status: DC | PRN

## 2019-12-05 MED ORDER — OXYCODONE-ACETAMINOPHEN 5-325 MG PO TABS
1.0000 | ORAL_TABLET | ORAL | Status: DC | PRN
  Administered 2019-12-05: 2 via ORAL
  Administered 2019-12-06 – 2019-12-07 (×3): 1 via ORAL
  Filled 2019-12-05 (×2): qty 2
  Filled 2019-12-05 (×2): qty 1

## 2019-12-05 MED ORDER — PHENOL 1.4 % MT LIQD: 1.0000 | OROMUCOSAL | Status: DC | PRN

## 2019-12-05 MED ORDER — GABAPENTIN 300 MG PO CAPS
300.0000 mg | ORAL_CAPSULE | Freq: Three times a day (TID) | ORAL | Status: DC
  Administered 2019-12-05 – 2019-12-07 (×6): 300 mg via ORAL
  Filled 2019-12-05 (×6): qty 1

## 2019-12-05 MED ORDER — PANTOPRAZOLE SODIUM 40 MG PO TBEC
40.0000 mg | DELAYED_RELEASE_TABLET | Freq: Two times a day (BID) | ORAL | Status: DC
  Administered 2019-12-05 – 2019-12-07 (×4): 40 mg via ORAL
  Filled 2019-12-05 (×4): qty 1

## 2019-12-05 MED ORDER — PHENYLEPHRINE 40 MCG/ML (10ML) SYRINGE FOR IV PUSH (FOR BLOOD PRESSURE SUPPORT)
PREFILLED_SYRINGE | INTRAVENOUS | Status: AC
  Filled 2019-12-05: qty 10

## 2019-12-05 MED ORDER — ONDANSETRON HCL 4 MG/2ML IJ SOLN
INTRAMUSCULAR | Status: DC | PRN
  Administered 2019-12-05: 4 mg via INTRAVENOUS

## 2019-12-05 MED ORDER — LACTATED RINGERS IV SOLN: INTRAVENOUS | Status: DC | PRN

## 2019-12-05 MED ORDER — INSULIN ASPART 100 UNIT/ML ~~LOC~~ SOLN: 0.0000 [IU] | Freq: Every day | SUBCUTANEOUS | Status: DC

## 2019-12-05 MED ORDER — FENTANYL CITRATE (PF) 250 MCG/5ML IJ SOLN
INTRAMUSCULAR | Status: AC
  Filled 2019-12-05: qty 5

## 2019-12-05 MED ORDER — DEXAMETHASONE SODIUM PHOSPHATE 10 MG/ML IJ SOLN
INTRAMUSCULAR | Status: DC | PRN
  Administered 2019-12-05: 10 mg via INTRAVENOUS

## 2019-12-05 MED ORDER — ACETAMINOPHEN 10 MG/ML IV SOLN
INTRAVENOUS | Status: DC | PRN
  Administered 2019-12-05: 1000 mg via INTRAVENOUS

## 2019-12-05 MED ORDER — ALBUMIN HUMAN 5 % IV SOLN: INTRAVENOUS | Status: DC | PRN

## 2019-12-05 MED ORDER — LIDOCAINE 2% (20 MG/ML) 5 ML SYRINGE
INTRAMUSCULAR | Status: DC | PRN
  Administered 2019-12-05: 60 mg via INTRAVENOUS

## 2019-12-05 MED ORDER — LIDOCAINE-EPINEPHRINE 1 %-1:100000 IJ SOLN
INTRAMUSCULAR | Status: AC
  Filled 2019-12-05: qty 1

## 2019-12-05 MED ORDER — MENTHOL 3 MG MT LOZG: 1.0000 | LOZENGE | OROMUCOSAL | Status: DC | PRN

## 2019-12-05 MED ORDER — TAMSULOSIN HCL 0.4 MG PO CAPS
0.4000 mg | ORAL_CAPSULE | Freq: Every day | ORAL | Status: DC
  Administered 2019-12-06 – 2019-12-07 (×2): 0.4 mg via ORAL
  Filled 2019-12-05 (×2): qty 1

## 2019-12-05 MED ORDER — FINASTERIDE 5 MG PO TABS
5.0000 mg | ORAL_TABLET | Freq: Every day | ORAL | Status: DC
  Administered 2019-12-06 – 2019-12-07 (×2): 5 mg via ORAL
  Filled 2019-12-05 (×2): qty 1

## 2019-12-05 MED ORDER — ONDANSETRON HCL 4 MG PO TABS: 4.0000 mg | ORAL_TABLET | Freq: Four times a day (QID) | ORAL | Status: DC | PRN

## 2019-12-05 MED ORDER — CHLORHEXIDINE GLUCONATE 0.12 % MT SOLN
15.0000 mL | Freq: Once | OROMUCOSAL | Status: AC
  Administered 2019-12-05: 15 mL via OROMUCOSAL
  Filled 2019-12-05: qty 15

## 2019-12-05 MED ORDER — THROMBIN 20000 UNITS EX SOLR
CUTANEOUS | Status: AC
  Filled 2019-12-05: qty 20000

## 2019-12-05 MED ORDER — CHLORHEXIDINE GLUCONATE CLOTH 2 % EX PADS: 6.0000 | MEDICATED_PAD | Freq: Once | CUTANEOUS | Status: DC

## 2019-12-05 MED ORDER — ACETAMINOPHEN 500 MG PO TABS
1000.0000 mg | ORAL_TABLET | Freq: Once | ORAL | Status: AC
  Administered 2019-12-05: 1000 mg via ORAL
  Filled 2019-12-05: qty 2

## 2019-12-05 MED ORDER — MIDAZOLAM HCL 5 MG/5ML IJ SOLN
INTRAMUSCULAR | Status: DC | PRN
  Administered 2019-12-05: 1 mg via INTRAVENOUS

## 2019-12-05 MED ORDER — SODIUM CHLORIDE 0.9 % IV SOLN: INTRAVENOUS | Status: DC

## 2019-12-05 MED ORDER — MIDAZOLAM HCL 2 MG/2ML IJ SOLN
INTRAMUSCULAR | Status: AC
  Filled 2019-12-05: qty 2

## 2019-12-05 MED ORDER — BUPIVACAINE HCL (PF) 0.5 % IJ SOLN
INTRAMUSCULAR | Status: AC
  Filled 2019-12-05: qty 30

## 2019-12-05 MED ORDER — DEXAMETHASONE SODIUM PHOSPHATE 10 MG/ML IJ SOLN
INTRAMUSCULAR | Status: AC
  Filled 2019-12-05: qty 1

## 2019-12-05 MED ORDER — EPHEDRINE SULFATE 50 MG/ML IJ SOLN
INTRAMUSCULAR | Status: DC | PRN
  Administered 2019-12-05 (×2): 5 mg via INTRAVENOUS

## 2019-12-05 MED ORDER — LACTATED RINGERS IV SOLN: INTRAVENOUS | Status: DC

## 2019-12-05 MED ORDER — LOSARTAN POTASSIUM 50 MG PO TABS
100.0000 mg | ORAL_TABLET | Freq: Every evening | ORAL | Status: DC
  Administered 2019-12-05 – 2019-12-06 (×2): 100 mg via ORAL
  Filled 2019-12-05 (×2): qty 2

## 2019-12-05 MED ORDER — LIDOCAINE 2% (20 MG/ML) 5 ML SYRINGE
INTRAMUSCULAR | Status: AC
  Filled 2019-12-05: qty 5

## 2019-12-05 MED ORDER — KETAMINE HCL 50 MG/5ML IJ SOSY
PREFILLED_SYRINGE | INTRAMUSCULAR | Status: AC
  Filled 2019-12-05: qty 5

## 2019-12-05 MED ORDER — PROPOFOL 10 MG/ML IV BOLUS
INTRAVENOUS | Status: AC
  Filled 2019-12-05: qty 40

## 2019-12-05 MED ORDER — FENTANYL CITRATE (PF) 250 MCG/5ML IJ SOLN
INTRAMUSCULAR | Status: DC | PRN
Start: 2019-12-05 — End: 2019-12-05
  Administered 2019-12-05: 150 ug via INTRAVENOUS
  Administered 2019-12-05 (×2): 50 ug via INTRAVENOUS

## 2019-12-05 MED ORDER — ACETAMINOPHEN 10 MG/ML IV SOLN
INTRAVENOUS | Status: AC
  Filled 2019-12-05: qty 100

## 2019-12-05 MED ORDER — THROMBIN 5000 UNITS EX SOLR
CUTANEOUS | Status: AC
  Filled 2019-12-05: qty 5000

## 2019-12-05 MED ORDER — SODIUM CHLORIDE 0.9% FLUSH
3.0000 mL | INTRAVENOUS | Status: DC | PRN
  Administered 2019-12-05: 3 mL via INTRAVENOUS

## 2019-12-05 MED ORDER — BUPIVACAINE HCL (PF) 0.5 % IJ SOLN
INTRAMUSCULAR | Status: DC | PRN
  Administered 2019-12-05: 5 mL

## 2019-12-05 MED ORDER — ROCURONIUM BROMIDE 10 MG/ML (PF) SYRINGE
PREFILLED_SYRINGE | INTRAVENOUS | Status: AC
  Filled 2019-12-05: qty 20

## 2019-12-05 MED ORDER — SODIUM CHLORIDE 0.9 % IV SOLN: 250.0000 mL | INTRAVENOUS | Status: DC

## 2019-12-05 MED ORDER — MORPHINE SULFATE (PF) 2 MG/ML IV SOLN: 2.0000 mg | INTRAVENOUS | Status: DC | PRN

## 2019-12-05 MED ORDER — LIDOCAINE-EPINEPHRINE 1 %-1:100000 IJ SOLN
INTRAMUSCULAR | Status: DC | PRN
  Administered 2019-12-05: 5 mL

## 2019-12-05 MED ORDER — THROMBIN 5000 UNITS EX SOLR
OROMUCOSAL | Status: DC | PRN
  Administered 2019-12-05: 5 mL via TOPICAL

## 2019-12-05 MED ORDER — ROCURONIUM BROMIDE 10 MG/ML (PF) SYRINGE
PREFILLED_SYRINGE | INTRAVENOUS | Status: DC | PRN
  Administered 2019-12-05 (×2): 20 mg via INTRAVENOUS
  Administered 2019-12-05: 70 mg via INTRAVENOUS
  Administered 2019-12-05: 20 mg via INTRAVENOUS

## 2019-12-05 MED ORDER — ONDANSETRON HCL 4 MG/2ML IJ SOLN
INTRAMUSCULAR | Status: AC
  Filled 2019-12-05: qty 2

## 2019-12-05 MED ORDER — CHLORHEXIDINE GLUCONATE CLOTH 2 % EX PADS
6.0000 | MEDICATED_PAD | Freq: Every day | CUTANEOUS | Status: DC
  Administered 2019-12-06: 6 via TOPICAL

## 2019-12-05 MED ORDER — MAGNESIUM OXIDE 400 (241.3 MG) MG PO TABS
400.0000 mg | ORAL_TABLET | Freq: Two times a day (BID) | ORAL | Status: DC
  Administered 2019-12-05 – 2019-12-07 (×4): 400 mg via ORAL
  Filled 2019-12-05 (×4): qty 1

## 2019-12-05 MED ORDER — SUCCINYLCHOLINE CHLORIDE 20 MG/ML IJ SOLN
INTRAMUSCULAR | Status: DC | PRN
  Administered 2019-12-05: 140 mg via INTRAVENOUS

## 2019-12-05 MED ORDER — HYDROMORPHONE HCL 1 MG/ML IJ SOLN
0.2500 mg | INTRAMUSCULAR | Status: DC | PRN
  Administered 2019-12-05 (×2): 0.5 mg via INTRAVENOUS

## 2019-12-05 MED ORDER — BISOPROLOL-HYDROCHLOROTHIAZIDE 5-6.25 MG PO TABS
1.0000 | ORAL_TABLET | Freq: Every morning | ORAL | Status: DC
  Administered 2019-12-06 – 2019-12-07 (×2): 1 via ORAL
  Filled 2019-12-05 (×2): qty 1

## 2019-12-05 MED ORDER — THROMBIN 20000 UNITS EX SOLR
CUTANEOUS | Status: DC | PRN
  Administered 2019-12-05: 20 mL

## 2019-12-05 MED ORDER — SUGAMMADEX SODIUM 200 MG/2ML IV SOLN
INTRAVENOUS | Status: DC | PRN
  Administered 2019-12-05: 250 mg via INTRAVENOUS

## 2019-12-05 MED ORDER — SODIUM CHLORIDE 0.9% FLUSH
3.0000 mL | Freq: Two times a day (BID) | INTRAVENOUS | Status: DC
  Administered 2019-12-05 – 2019-12-07 (×4): 3 mL via INTRAVENOUS

## 2019-12-05 MED ORDER — HYDROMORPHONE HCL 1 MG/ML IJ SOLN
INTRAMUSCULAR | Status: AC
Start: 2019-12-05 — End: 2019-12-06
  Filled 2019-12-05: qty 1

## 2019-12-05 MED ORDER — KETAMINE HCL 10 MG/ML IJ SOLN
INTRAMUSCULAR | Status: DC | PRN
  Administered 2019-12-05: 50 mg via INTRAVENOUS

## 2019-12-05 MED ORDER — INSULIN ASPART 100 UNIT/ML ~~LOC~~ SOLN
0.0000 [IU] | Freq: Three times a day (TID) | SUBCUTANEOUS | Status: DC
  Administered 2019-12-06 – 2019-12-07 (×3): 3 [IU] via SUBCUTANEOUS

## 2019-12-05 MED ORDER — 0.9 % SODIUM CHLORIDE (POUR BTL) OPTIME
TOPICAL | Status: DC | PRN
  Administered 2019-12-05: 1000 mL

## 2019-12-05 MED ORDER — EPHEDRINE 5 MG/ML INJ
INTRAVENOUS | Status: AC
  Filled 2019-12-05: qty 10

## 2019-12-05 MED ORDER — FENOFIBRATE 54 MG PO TABS
54.0000 mg | ORAL_TABLET | Freq: Every day | ORAL | Status: DC
  Administered 2019-12-06 – 2019-12-07 (×2): 54 mg via ORAL
  Filled 2019-12-05 (×2): qty 1

## 2019-12-05 MED ORDER — ORAL CARE MOUTH RINSE: 15.0000 mL | Freq: Once | OROMUCOSAL | Status: AC

## 2019-12-05 MED ORDER — PROPOFOL 10 MG/ML IV BOLUS
INTRAVENOUS | Status: DC | PRN
  Administered 2019-12-05: 150 mg via INTRAVENOUS

## 2019-12-05 SURGICAL SUPPLY — 77 items
ADH SKN CLS APL DERMABOND .7 (GAUZE/BANDAGES/DRESSINGS) ×1
APL SRG 60D 8 XTD TIP BNDBL (TIP)
BAG DECANTER FOR FLEXI CONT (MISCELLANEOUS) ×2 IMPLANT
BASKET BONE COLLECTION (BASKET) ×2 IMPLANT
BLADE CLIPPER SURG (BLADE) IMPLANT
BONE CANC CHIPS 20CC PCAN1/4 (Bone Implant) ×2 IMPLANT
BUR MATCHSTICK NEURO 3.0 LAGG (BURR) ×2 IMPLANT
CANISTER SUCT 3000ML PPV (MISCELLANEOUS) ×2 IMPLANT
CHIPS CANC BONE 20CC PCAN1/4 (Bone Implant) ×1 IMPLANT
CNTNR URN SCR LID CUP LEK RST (MISCELLANEOUS) ×1 IMPLANT
CONNECTOR RELINE 30MM OPEN OFF (Connector) ×4 IMPLANT
CONT SPEC 4OZ STRL OR WHT (MISCELLANEOUS) ×2
COVER BACK TABLE 60X90IN (DRAPES) ×2 IMPLANT
COVER WAND RF STERILE (DRAPES) ×2 IMPLANT
DECANTER SPIKE VIAL GLASS SM (MISCELLANEOUS) ×2 IMPLANT
DERMABOND ADVANCED (GAUZE/BANDAGES/DRESSINGS) ×1
DERMABOND ADVANCED .7 DNX12 (GAUZE/BANDAGES/DRESSINGS) ×1 IMPLANT
DEVICE DISSECT PLASMABLAD 3.0S (MISCELLANEOUS) ×1 IMPLANT
DRAPE C-ARM 42X72 X-RAY (DRAPES) ×4 IMPLANT
DRAPE HALF SHEET 40X57 (DRAPES) IMPLANT
DRAPE LAPAROTOMY 100X72X124 (DRAPES) ×2 IMPLANT
DRSG OPSITE POSTOP 4X8 (GAUZE/BANDAGES/DRESSINGS) ×2 IMPLANT
DURAPREP 26ML APPLICATOR (WOUND CARE) ×2 IMPLANT
DURASEAL APPLICATOR TIP (TIP) IMPLANT
DURASEAL SPINE SEALANT 3ML (MISCELLANEOUS) IMPLANT
ELECT REM PT RETURN 9FT ADLT (ELECTROSURGICAL) ×2
ELECTRODE REM PT RTRN 9FT ADLT (ELECTROSURGICAL) ×1 IMPLANT
GAUZE 4X4 16PLY RFD (DISPOSABLE) IMPLANT
GAUZE SPONGE 4X4 12PLY STRL (GAUZE/BANDAGES/DRESSINGS) ×2 IMPLANT
GLOVE BIO SURGEON STRL SZ 6.5 (GLOVE) ×2 IMPLANT
GLOVE BIOGEL PI IND STRL 6.5 (GLOVE) ×1 IMPLANT
GLOVE BIOGEL PI IND STRL 8.5 (GLOVE) ×2 IMPLANT
GLOVE BIOGEL PI INDICATOR 6.5 (GLOVE) ×2
GLOVE BIOGEL PI INDICATOR 8.5 (GLOVE) ×2
GLOVE ECLIPSE 8.5 STRL (GLOVE) ×5 IMPLANT
GLOVE SURG SS PI 6.0 STRL IVOR (GLOVE) ×6 IMPLANT
GOWN STRL REUS W/ TWL LRG LVL3 (GOWN DISPOSABLE) IMPLANT
GOWN STRL REUS W/ TWL XL LVL3 (GOWN DISPOSABLE) IMPLANT
GOWN STRL REUS W/TWL 2XL LVL3 (GOWN DISPOSABLE) ×4 IMPLANT
GOWN STRL REUS W/TWL LRG LVL3 (GOWN DISPOSABLE)
GOWN STRL REUS W/TWL XL LVL3 (GOWN DISPOSABLE)
GRAFT BNE CANC CHIPS 1-8 20CC (Bone Implant) IMPLANT
GUIDEWIRE NITINOL BEVEL TIP (WIRE) ×1 IMPLANT
HEMOSTAT POWDER KIT SURGIFOAM (HEMOSTASIS) IMPLANT
KIT BASIN OR (CUSTOM PROCEDURE TRAY) ×2 IMPLANT
KIT INFUSE SMALL (Orthopedic Implant) ×1 IMPLANT
KIT TURNOVER KIT B (KITS) ×2 IMPLANT
MILL MEDIUM DISP (BLADE) ×2 IMPLANT
NDL I-PASS III (NEEDLE) IMPLANT
NDL SPNL 18GX3.5 QUINCKE PK (NEEDLE) IMPLANT
NEEDLE HYPO 22GX1.5 SAFETY (NEEDLE) ×2 IMPLANT
NEEDLE I-PASS III (NEEDLE) ×2 IMPLANT
NEEDLE SPNL 18GX3.5 QUINCKE PK (NEEDLE) IMPLANT
NS IRRIG 1000ML POUR BTL (IV SOLUTION) ×2 IMPLANT
PACK LAMINECTOMY NEURO (CUSTOM PROCEDURE TRAY) ×2 IMPLANT
PAD ARMBOARD 7.5X6 YLW CONV (MISCELLANEOUS) ×6 IMPLANT
PATTIES SURGICAL .5 X1 (DISPOSABLE) ×2 IMPLANT
PLASMABLADE 3.0S (MISCELLANEOUS) ×2
ROD RELINE LORDOTIC 5.5X140 (Rod) ×2 IMPLANT
ROD SPINAL 5.5X150MM TI POST (Rod) ×1 IMPLANT
SCREW LOCK RELINE 5.5 TULIP (Screw) ×12 IMPLANT
SCREW MAS RELINE 6.5X50 POLY (Screw) ×2 IMPLANT
SCREW SPINAL 8.5X80 RELINE (Screw) ×2 IMPLANT
SPONGE LAP 4X18 RFD (DISPOSABLE) IMPLANT
SPONGE SURGIFOAM ABS GEL 100 (HEMOSTASIS) ×2 IMPLANT
SUT PROLENE 6 0 BV (SUTURE) IMPLANT
SUT VIC AB 1 CT1 18XBRD ANBCTR (SUTURE) ×1 IMPLANT
SUT VIC AB 1 CT1 8-18 (SUTURE) ×2
SUT VIC AB 2-0 CP2 18 (SUTURE) ×2 IMPLANT
SUT VIC AB 3-0 SH 8-18 (SUTURE) ×2 IMPLANT
SUT VIC AB 4-0 RB1 18 (SUTURE) ×2 IMPLANT
SYR 3ML LL SCALE MARK (SYRINGE) ×8 IMPLANT
SYR 5ML LL (SYRINGE) IMPLANT
TOWEL GREEN STERILE (TOWEL DISPOSABLE) ×2 IMPLANT
TOWEL GREEN STERILE FF (TOWEL DISPOSABLE) ×2 IMPLANT
TRAY FOLEY MTR SLVR 16FR STAT (SET/KITS/TRAYS/PACK) ×2 IMPLANT
WATER STERILE IRR 1000ML POUR (IV SOLUTION) ×2 IMPLANT

## 2019-12-05 NOTE — H&P (Signed)
[name] [name] is an 79 y.o. male.   Chief Complaint: Back and bilateral leg pain HPI: [name] [name] is a 79 year old individual who back in June presented with severe back pain inability to walk with marked weakness in his proximal lower extremities. [name] [name] had had a previous decompression and fusion at L2-3. He was noted that he had a severe high-grade stenosis at L3-4 and L4-5. He underwent surgical decompression stabilization with pedicle screws from L2-L5. Initially seem to be getting better however over the past month or so has developed progressively worsening back pain. Work-up disclosed that the patient has fractures through both pedicles with loss of fixation of the L5 vertebrae. He also has developed significant vacuum disc phenomenon at L5-S1 with an worsening anterolisthesis of L5-S1. There is lateral recess stenosis particularly for the L5 nerve root. Does have evidence of mild weakness in the tibialis anterior. Is been advised regarding the need for revision and that he will require fixation to the pelvis. He is now admitted for that procedure.  Past Medical History:  Diagnosis Date  . Arthritis    in left hip  . ED (erectile dysfunction)   . Enlarged prostate with lower urinary tract symptoms (LUTS)   . GERD (gastroesophageal reflux disease)   . History of chronic gastritis   . History of colon polyps    hyperplastic 2006  . History of esophageal stricture    S/P  DILATATION 2009; 2010; 2010 2012  . History of kidney stones    hx. multiple kidney stones  . History of kidney stones   . History of primary hyperparathyroidism    s/p  left superior parathyroidectomy 07-10-2014  . Hyperlipidemia   . Hypertension   . Ileus ([place]) 05/02/2017  . Left ureteral stone   . Pre-diabetes   . Sepsis secondary to UTI ([place]) 05/02/2017    Past Surgical History:  Procedure Laterality Date  . BACK SURGERY  04/2018   fusion L2 and L3  . COLONOSCOPY  02/17/2005  . CYSTO/  LEFT RETROGRADE  PYELOGRAM/ STENT PLACEMENT  01/26/2005  . CYSTOSCOPY W/ URETEROSCOPY W/ LITHOTRIPSY Left 05/04/2005  . CYSTOSCOPY WITH STENT PLACEMENT Left 12/30/2015   Procedure: CYSTOSCOPY WITH STENT PLACEMENT;  Surgeon: Franchot Gallo, MD;  Location: [facility];  Service: Urology;  Laterality: Left;  . CYSTOSCOPY/RETROGRADE/URETEROSCOPY/STONE EXTRACTION WITH BASKET Left 12/30/2015   Procedure: CYSTOSCOPY/RETROGRADE/URETEROSCOPY/STONE EXTRACTION WITH BASKET;  Surgeon: Franchot Gallo, MD;  Location: [facility];  Service: Urology;  Laterality: Left;  . CYSTOSCOPY/URETEROSCOPY/HOLMIUM [name]/STENT PLACEMENT Left 03/09/2016   Procedure: CYSTOSCOPY/RETROGRADE PYELOGRAM/URETEROSCOPY/BASKET STONE EXTRACTION/STENT PLACEMENT;  Surgeon: Franchot Gallo, MD;  Location: [facility];  Service: Urology;  Laterality: Left;  . ESOPHAGOGASTRODUODENOSCOPY (EGD) WITH ESOPHAGEAL DILATION  x4  last one 07-21-2010  . HOLMIUM [name] APPLICATION Left 18/29/9371   Procedure: HOLMIUM [name] APPLICATION;  Surgeon: Franchot Gallo, MD;  Location: [facility];  Service: Urology;  Laterality: Left;  . INGUINAL HERNIA REPAIR Left yrs ago  . LUMBAR LAMINECTOMY/DECOMPRESSION MICRODISCECTOMY Left 11/09/2017   Procedure: Left Lumbar Two-Three Microdiscectomy;  Surgeon: Kristeen Miss, MD;  Location: [place];  Service: Neurosurgery;  Laterality: Left;  Left Lumbar Two-Three Microdiscectomy  . NEPHROLITHOTOMY Left 02/01/2014   Procedure: NEPHROLITHOTOMY PERCUTANEOUS;  Surgeon: Jorja Loa, MD;  Location: [facility];  Service: Urology;  Laterality: Left;  . ORIF HIP FRACTURE Left 04/16/2017   Procedure: OPEN REDUCTION INTERNAL FIXATION HIP GREATER TROCHANTER;  Surgeon: Frederik Pear, MD;  Location: [place];  Service: Orthopedics;  Laterality: Left;  . PARATHYROIDECTOMY Left 07/10/2014   Procedure: LEFT SUPERIOR PARATHYROIDECTOMY;  Surgeon: Armandina Gemma, MD;  Location: [place];  Service: General;  Laterality:  Left;  . POSTERIOR LUMBAR FUSION  09/07/2019  . TOTAL HIP ARTHROPLASTY Left 06/24/2016   Procedure: LEFT TOTAL HIP ARTHROPLASTY ANTERIOR APPROACH;  Surgeon: Gaynelle Arabian, MD;  Location: [facility];  Service: Orthopedics;  Laterality: Left;  . TOTAL HIP REVISION Left 11/18/2016   Procedure: Left femoral revision - posterior approach;  Surgeon: Gaynelle Arabian, MD;  Location: [facility];  Service: Orthopedics;  Laterality: Left;  . tumor ear Left age 62    topical growth behind left ear.    Family History  Problem Relation Age of Onset  . Heart disease Mother   . Alzheimer's disease Mother   . Heart disease Father   . Bladder Cancer Sister   . Colon cancer Neg Hx   . Esophageal cancer Neg Hx   . Stomach cancer Neg Hx   . Rectal cancer Neg Hx    Social History:  reports that he quit smoking about 45 years ago. He quit after 8.00 years of use. He has never used smokeless tobacco. He reports previous alcohol use of about 1.0 - 2.0 standard drink of alcohol per week. He reports that he does not use drugs.  Allergies:  Allergies  Allergen Reactions  . Iodinated Diagnostic Agents Shortness Of Breath  . Iohexol Shortness Of Breath    Medications Prior to Admission  Medication Sig Dispense Refill  . aspirin EC 81 MG tablet Take 81 mg by mouth daily.    Marland Kitchen b complex vitamins tablet Take 1 tablet by mouth daily.    . bisoprolol-hydrochlorothiazide (ZIAC) 5-6.25 MG tablet Take 1 tablet Daily for BP (Patient taking differently: Take 1 tablet by mouth in the morning. Take 1 tablet Daily for BP) 90 tablet 3  . Cholecalciferol (VITAMIN D) 125 MCG (5000 UT) CAPS Take 5,000 Units by mouth daily.    . fenofibrate (TRICOR) 145 MG tablet Take 1 tablet Daily for Triglycerides (Blood Fats) (Patient taking differently: Take 145 mg by mouth daily. For triglycerides (blood fats)) 90 tablet 3  . finasteride (PROSCAR) 5 MG tablet Take 5 mg by mouth daily.    . Flaxseed, Linseed, (FLAX SEEDS PO) Take 1,000 mg by mouth  daily.     Marland Kitchen gabapentin (NEURONTIN) 300 MG capsule Take 300 mg by mouth 3 (three) times daily.    . hyoscyamine (LEVSIN SL) 0.125 MG SL tablet PLACE 1 TABLET UNDER THE TONGUE DAILY AS NEEDED. (Patient taking differently: Take 0.125 mg by mouth every 6 (six) hours as needed for cramping. ) 30 tablet 1  . losartan (COZAAR) 100 MG tablet TAKE 1 TABLET BY MOUTH DAILY FOR BLOOD PRESSURE (Patient taking differently: Take 100 mg by mouth every evening. ) 90 tablet 3  . Magnesium Oxide 250 MG TABS Take 250 mg by mouth 2 (two) times daily.     . meloxicam (MOBIC) 15 MG tablet TAKE 1/2 TO 1 TABLET DAILY WITH FOOD FOR PAIN AND INFLAMMATION (Patient taking differently: Take 15 mg by mouth daily. ) 90 tablet 1  . Omega-3 Fatty Acids (FISH OIL) 1200 MG CAPS Take 1,200 mg by mouth 2 (two) times daily.     Marland Kitchen oxyCODONE-acetaminophen (PERCOCET/ROXICET) 5-325 MG tablet Take 1-2 tablets by mouth every 3 (three) hours as needed for moderate pain or severe pain. (Patient taking differently: Take 2 tablets by mouth every 4 (four) hours as needed  for moderate pain or severe pain. ) 30 tablet 0  . pantoprazole (PROTONIX) 40 MG tablet TAKE 1 TABLET BY MOUTH TWICE A DAY. (Patient taking differently: Take 40 mg by mouth 2 (two) times daily. ) 180 tablet 1  . sildenafil (VIAGRA) 100 MG tablet Take 100 mg by mouth daily as needed for erectile dysfunction.    . tamsulosin (FLOMAX) 0.4 MG CAPS capsule Take 0.4 mg by mouth daily after breakfast.   3  . vitamin B-12 (CYANOCOBALAMIN) 1000 MCG tablet Take 1 tablet (1,000 mcg total) by mouth daily. 30 tablet 0  . Cholecalciferol (VITAMIN D PO) Take 5,000 Units by mouth daily.  (Patient not taking: Reported on 11/29/2019)    . diclofenac Sodium (VOLTAREN) 1 % GEL Apply 4 g topically 4 (four) times daily. (Patient not taking: Reported on 11/29/2019) 100 g 3    Results for orders placed or performed during the hospital encounter of 12/05/19 (from the past 48 hour(s))  Glucose, capillary      Status: Abnormal   Collection Time: 12/05/19  5:49 AM  Result Value Ref Range   Glucose-Capillary 145 (H) 70 - 99 mg/dL    Comment: Glucose reference range applies only to samples taken after fasting for at least 8 hours.   No results found.  Review of Systems  Constitutional: Positive for activity change.  HENT: Negative.   Eyes: Negative.   Respiratory: Negative.   Cardiovascular: Negative.   Gastrointestinal: Negative.   Endocrine: Negative.   Genitourinary: Negative.   Musculoskeletal: Positive for back pain and myalgias.  Allergic/Immunologic: Negative.   Neurological: Positive for weakness and numbness.  Hematological: Negative.   Psychiatric/Behavioral: Negative.     Blood pressure (!) 183/96, pulse 73, temperature 97.7 F (36.5 C), temperature source Oral, resp. rate 18, height 5\' 11"  (1.803 m), weight 122.9 kg, SpO2 97 %. Physical Exam Constitutional:      Appearance: Normal appearance. He is obese.  HENT:     Head: Normocephalic and atraumatic.     Right Ear: Tympanic membrane normal.     Left Ear: Tympanic membrane normal.     Nose: Nose normal.     Mouth/Throat:     Mouth: Mucous membranes are moist.  Eyes:     Extraocular Movements: Extraocular movements intact.     Pupils: Pupils are equal, round, and reactive to light.  Cardiovascular:     Rate and Rhythm: Normal rate and regular rhythm.     Pulses: Normal pulses.     Heart sounds: Normal heart sounds.  Pulmonary:     Effort: Pulmonary effort is normal.     Breath sounds: Normal breath sounds.  Abdominal:     General: Abdomen is flat.     Palpations: Abdomen is soft.  Musculoskeletal:     Cervical back: Normal range of motion and neck supple.  Skin:    General: Skin is warm and dry.     Capillary Refill: Capillary refill takes less than 2 seconds.  Neurological:     Mental Status: He is alert.     Comments: Cranial nerve examination is within limits of normal. Upper extremity strength is normal  deep tendon reflexes are diminished in the biceps and triceps. In the lower extremities he has weakness proximally in the iliopsoas and quads at 4 out of 5 he also now has weakness in the tibialis anterior 4 out of 5 worse on the right than on the left. Plantar flexor strength is intact deep tendon reflexes are  absent in the patella and the Achilles. Sensation is diminished in the proximal lower extremities on the anterior surface of the thighs intact in the calves but decreased in the dorsal surface of the feet bilaterally to pin and light touch. Babinski reflex is downgoing.  Psychiatric:        Mood and Affect: Mood normal.        Behavior: Behavior normal.      Assessment/Plan Status post decompression and fusion L2-L5. Loss of fixation at L5 with progressive stenosis and L5 radiculopathy.  Plan: Decompression fusion of L5 and S1 with fixation to the pelvis from L2 to the pelvis.  Earleen Newport, MD 12/05/2019, 7:53 AM

## 2019-12-05 NOTE — Anesthesia Postprocedure Evaluation (Signed)
Anesthesia Post Note  Patient: [name] [name]  Procedure(s) Performed: Revision of Lumbar Two to Lumbar Five Fixation with Lumbar Two to Pelvis fixation (N/A Back)     Patient location during evaluation: PACU Anesthesia Type: General Level of consciousness: awake and alert Pain management: pain level controlled Vital Signs Assessment: post-procedure vital signs reviewed and stable Respiratory status: spontaneous breathing, nonlabored ventilation, respiratory function stable and patient connected to nasal cannula oxygen Cardiovascular status: blood pressure returned to baseline and stable Postop Assessment: no apparent nausea or vomiting Anesthetic complications: no   No complications documented.  Last Vitals:  Vitals:   12/05/19 1315 12/05/19 1318  BP:  (!) 158/89  Pulse: 66 65  Resp: (!) 21 20  Temp:    SpO2: 100% 99%    Last Pain:  Vitals:   12/05/19 1400  TempSrc:   PainSc: 0-No pain                 Deloros Beretta,W. EDMOND

## 2019-12-05 NOTE — Anesthesia Procedure Notes (Signed)
Arterial Line Insertion Start/End10/07/2019 7:05 AM, 12/05/2019 7:10 AM Performed by: Kyung Rudd, CRNA, CRNA  Preanesthetic checklist: patient identified, IV checked, site marked, risks and benefits discussed, surgical consent, monitors and equipment checked, pre-op evaluation, timeout performed and anesthesia consent Lidocaine 1% used for infiltration Left was placed Catheter size: 20 G Hand hygiene performed  and maximum sterile barriers used  Allen's test indicative of satisfactory collateral circulation Attempts: 1 Procedure performed without using ultrasound guided technique. Following insertion, dressing applied and Biopatch. Post procedure assessment: normal  Patient tolerated the procedure well with no immediate complications.

## 2019-12-05 NOTE — Anesthesia Procedure Notes (Signed)
Procedure Name: Intubation Date/Time: 12/05/2019 8:05 AM Performed by: Glynda Jaeger, CRNA Pre-anesthesia Checklist: Patient identified, Patient being monitored, Timeout performed, Emergency Drugs available and Suction available Patient Re-evaluated:Patient Re-evaluated prior to induction Oxygen Delivery Method: Circle System Utilized Preoxygenation: Pre-oxygenation with 100% oxygen Induction Type: IV induction Ventilation: Oral airway inserted - appropriate to patient size Laryngoscope Size: Mac and 4 Grade View: Grade II Tube type: Oral Tube size: 7.5 mm Number of attempts: 1 Airway Equipment and Method: Stylet Placement Confirmation: ETT inserted through vocal cords under direct vision,  positive ETCO2 and breath sounds checked- equal and bilateral Secured at: 23 cm Tube secured with: Tape Dental Injury: Teeth and Oropharynx as per pre-operative assessment

## 2019-12-05 NOTE — Progress Notes (Signed)
Orthopedic Tech Progress Note Patient Details:  [name] [name] 08-Mar-1940 [id number] Called order into [facility] Patient ID: [name] [name], male   DOB: 1940-03-15, 79 y.o.   MRN: [id number]   [name] [name] 12/05/2019, 4:54 PM

## 2019-12-05 NOTE — Transfer of Care (Signed)
Immediate Anesthesia Transfer of Care Note  Patient: [name] [name]  Procedure(s) Performed: Revision of Lumbar Two to Lumbar Five Fixation with Lumbar Two to Pelvis fixation (N/A Back)  Patient Location: PACU  Anesthesia Type:General  Level of Consciousness: drowsy and responds to stimulation  Airway & Oxygen Therapy: Patient Spontanous Breathing and Patient connected to face mask oxygen  Post-op Assessment: Report given to RN and Post -op Vital signs reviewed and stable  Post vital signs: Reviewed and stable  Last Vitals:  Vitals Value Taken Time  BP 160/92 12/05/19 1302  Temp    Pulse 67 12/05/19 1305  Resp 19 12/05/19 1306  SpO2 99 % 12/05/19 1305  Vitals shown include unvalidated device data.  Last Pain:  Vitals:   12/05/19 0629  TempSrc:   PainSc: 5       Patients Stated Pain Goal: 4 (12/07/10 1975)  Complications: No complications documented.

## 2019-12-05 NOTE — Op Note (Signed)
Date of surgery: 12/05/2019 Preoperative diagnosis: Loss of fixation at L5 with bilateral severe L5 radiculopathy status post L3-L5 decompression and fusion 08/2019 subsidence of L4-L5. Postoperative diagnosis: Same Procedure: Revision of decompression and fusion of L5 with fixation to the sacrum and the pelvis of L3-L5 arthrodesis.  Removal of L5 pedicle screws.  Posterior lateral arthrodesis with allograft autograft and infuse L5 to the sacrum. Surgeon: Kristeen Miss First Assistant: Osie Cheeks, NP Anesthesia: General endotracheal Indications: [name] [name] is a 79 year old individual who several months ago underwent decompression and fusion from L3-L5 he had a previous L2-3 decompression fusion.  He had progressively worsening back pain over the past month or so and was noted to have loss of fixation at L5 with subsidence of the L4-L5 interspace.  Patient was noted to have fractures through the pedicles of L5.  Posteriorly he had good stabilization across the disc space and it was felt that ultimately he would require revision of his posterior lateral arthrodesis to include a fusion to the sacrum with stabilization to the pelvis.  He is now admitted to the operating room to undergo that procedure.  Procedure: Patient was brought to the operating room supine on the stretcher.  After the smooth induction of general endotracheal anesthesia he was placed on the operating table in the prone position.  His left arm required to be placed at his side because of shoulder pathology.  After adequately positioning and padding all the bony prominences the back was prepped with alcohol DuraPrep and draped in a sterile fashion the previously made midline incision was reopened and carried down to the lumbodorsal fascia.  Fascia was opened on either side of midline and the dissection was carried inferiorly to expose initially the hardware on the left side.  This was exposed so as to allow loosening of the screw heads and  removal of the rod.  This was then repeated on the right side.  Significant soft tissue material had grown up around the hardware.  There is no to be a substantial abutment of his fixation hardware into the sacrum.  This was carefully removed and the L5 screws were obviously very loose and these were removed on each side.  Next it was decided whether a formal posterior lumbar interbody arthrodesis should be performed at L5-S1 and it was noted that the posterior elements were solidly fused to the sacrum.  Dissection of this would yield significant loosening of the posterior construct.  Is therefore decided to place pedicle screws through the sacrum through the L5 laminar arch and also to place screws into the iliac crest on each side.  The iliacs screws were sounded blindly and 8.5 x 80 mm screws were placed into each iliac crest posteriorly.  The sacrum was then probed and under fluoroscopic guidance and placed pedicle screws through the sacrum each measuring 6.5 x 50 mm.  These were countersunk appropriately.  Then the lateral gutters were decorticated after removal of the screw Abagail Kitchens temporarily and the anterior transverse space from L5 down to the sacral ala was decorticated and packed with a combination of autograft allograft and infuse a total of 15 cc was placed in each lateral gutter.  Once the gutters were packed the screws were reinserted and a SideArm connector was used to connect the pelvic screw to a precontoured 140 mm rod on the left side that had some additional contouring performed between L4 and the sacrum to allow a fit in a neutral position.  Once this was fitted  the system was provisionally tightened down.  The contralateral rod then required 150 mm rod to be contoured in a similar fashion to allow a neutral placement in the saddles from L to down to the pelvis.  Again these were provisionally tightened and then final torquing was performed on all the screw heads.  Once the torquing was performed  final radiographs were obtained in AP and lateral projection and these identified good placement of the hardware.  With this hemostasis in the soft tissues was obtained meticulously and then the lumbodorsal fascia was closed with #1 Vicryl in interrupted fashion 2-0 Vicryl was used in the subcutaneous tissues 3-0 Vicryl in the superficial subcuticular layer and 4-0 Vicryl for the final subcuticular closure.  A honeycomb dressing was placed over the incision.  Blood loss for the procedure was estimated at 400 cc and 150 cc of Cell Saver blood was returned to the patient.

## 2019-12-06 LAB — GLUCOSE, CAPILLARY
Glucose-Capillary: 140 mg/dL — ABNORMAL HIGH (ref 70–99)
Glucose-Capillary: 132 mg/dL — ABNORMAL HIGH (ref 70–99)
Glucose-Capillary: 108 mg/dL — ABNORMAL HIGH (ref 70–99)
Glucose-Capillary: 123 mg/dL — ABNORMAL HIGH (ref 70–99)

## 2019-12-06 LAB — CBC
HCT: 27.5 % — ABNORMAL LOW (ref 39.0–52.0)
Hemoglobin: 8.3 g/dL — ABNORMAL LOW (ref 13.0–17.0)
MCH: 24.9 pg — ABNORMAL LOW (ref 26.0–34.0)
MCHC: 30.2 g/dL (ref 30.0–36.0)
MCV: 82.6 fL (ref 80.0–100.0)
Platelets: 303 10*3/uL (ref 150–400)
RBC: 3.33 MIL/uL — ABNORMAL LOW (ref 4.22–5.81)
RDW: 15.6 % — ABNORMAL HIGH (ref 11.5–15.5)
WBC: 11.6 10*3/uL — ABNORMAL HIGH (ref 4.0–10.5)
nRBC: 0 % (ref 0.0–0.2)

## 2019-12-06 LAB — BASIC METABOLIC PANEL
Anion gap: 9 (ref 5–15)
BUN: 18 mg/dL (ref 8–23)
CO2: 24 mmol/L (ref 22–32)
Calcium: 8.8 mg/dL — ABNORMAL LOW (ref 8.9–10.3)
Chloride: 105 mmol/L (ref 98–111)
Creatinine, Ser: 1.5 mg/dL — ABNORMAL HIGH (ref 0.61–1.24)
GFR calc non Af Amer: 45 mL/min — ABNORMAL LOW (ref >60)
Glucose, Bld: 187 mg/dL — ABNORMAL HIGH (ref 70–99)
Potassium: 5 mmol/L (ref 3.5–5.1)
Sodium: 138 mmol/L (ref 135–145)

## 2019-12-06 MED FILL — Sodium Chloride IV Soln 0.9%: INTRAVENOUS | Qty: 2000 | Status: AC

## 2019-12-06 MED FILL — Heparin Sodium (Porcine) Inj 1000 Unit/ML: INTRAMUSCULAR | Qty: 30 | Status: AC

## 2019-12-06 NOTE — Progress Notes (Signed)
Back brace rep attempted to deliver brace, Pt refused. Pt stated he had a brace already and that his daughter would bring it.

## 2019-12-06 NOTE — Evaluation (Signed)
Physical Therapy Evaluation Patient Details Name: [name] [name] MRN: [id number] DOB: Feb 13, 1941 Today's Date: 12/06/2019   History of Present Illness  pt is a 79 y/o male with h/o HTN, L hip ORIF, THA then revision, L23 fusion 04/2018, admitted with neurogenic claudication and radiculopathy.  Pt s/p lami and decompression of L34 and L45 with PLIF, PLA, spacers and bone grafting.  Clinical Impression  Patient presents with decreased mobility due to pain, L hip weakness with decreased balance/gait and decreased safety awareness/knowledge of precautions.  Currently min A to minguard for mobility with RW on level surfaces.  Reports multiple step ups in the home and wife recently had back surgery as well.  Feel he could benefit from CIR level rehab, but if declined he will need follow up HHPT at d/c.     Follow Up Recommendations CIR    Equipment Recommendations  None recommended by PT    Recommendations for Other Services       Precautions / Restrictions Precautions Precautions: Back;Fall Required Braces or Orthoses: Spinal Brace Spinal Brace: Lumbar corset;Applied in sitting position      Mobility  Bed Mobility Overal bed mobility: Needs Assistance Bed Mobility: Rolling;Sidelying to Sit Rolling: Supervision Sidelying to sit: Min assist       General bed mobility comments: remembering from prior session, but admits may need some help  Transfers Overall transfer level: Needs assistance Equipment used: Rolling walker (2 wheeled) Transfers: Sit to/from Stand Sit to Stand: Supervision;Min guard         General transfer comment: chose the right hand placement without cues, minguard initially for balance  Ambulation/Gait Ambulation/Gait assistance: Min guard Gait Distance (Feet): 150 Feet Assistive device: Rolling walker (2 wheeled) Gait Pattern/deviations: Step-through pattern;Decreased stride length;Trendelenburg;Trunk flexed     General Gait Details: L hip kicks out  with L weight shift and over time posture degrades into flexion with walker out in front, adjusted walker twice during session with improved fit and pt educated to stop and stand when his arms give out since that is when he is more flexed and he can reset his posture  Stairs            Wheelchair Mobility    Modified Rankin (Stroke Patients Only)       Balance Overall balance assessment: Needs assistance Sitting-balance support: Feet supported Sitting balance-Leahy Scale: Good     Standing balance support: No upper extremity supported Standing balance-Leahy Scale: Fair Standing balance comment: can stand without UE support minguard for safety                             Pertinent Vitals/Pain Pain Assessment: Faces Faces Pain Scale: Hurts little more Pain Location: back  Pain Descriptors / Indicators: Operative site guarding Pain Intervention(s): Monitored during session;Repositioned    Home Living Family/patient expects to be discharged to:: Private residence Living Arrangements: Spouse/significant other Available Help at Discharge: Family;Available 24 hours/day Type of Home: House Home Access: Stairs to enter Entrance Stairs-Rails: Left Entrance Stairs-Number of Steps: several Home Layout: One level;Other (Comment) Home Equipment: Walker - 4 wheels;Cane - single point;Bedside commode;Walker - 2 wheels;Grab bars - tub/shower;Adaptive equipment Additional Comments: Pt reports wife had back surgery 3-4 weeks ago.  They have a hired caregiver, and daughter is present on caregiver's short days, but he and wife are alone after 1830 and on most of the weekends.  He reports  he had been renting a lift chair  as he has been unable to get in and out of the bed, but recently returned the chair, but can rent it again if necessary at discharge     Prior Function Level of Independence: Needs assistance   Gait / Transfers Assistance Needed: Pt ambulates with RW   ADL's  / Homemaking Assistance Needed: Pt reports he has been able to perform ADLs with set up assist - mod I using AE intermittently.   He requires assist with IADLs         Hand Dominance   Dominant Hand: Right    Extremity/Trunk Assessment   Upper Extremity Assessment Upper Extremity Assessment: Defer to OT evaluation    Lower Extremity Assessment Lower Extremity Assessment: Generalized weakness    Cervical / Trunk Assessment Cervical / Trunk Assessment: Other exceptions Cervical / Trunk Exceptions: morbidly obese; s/p lumbar fusion   Communication   Communication: No difficulties  Cognition Arousal/Alertness: Awake/alert Behavior During Therapy: Impulsive Overall Cognitive Status: Within Functional Limits for tasks assessed                                 General Comments: remembering the cues given by previous therapist today and reminding himself frequently to stay in the walker and for using brace and back precautions, still needed repeated cues to stop and stand when reported UE's fatiguing      General Comments General comments (skin integrity, edema, etc.): donned brace at EOB with S, cues to adjust once standing    Exercises     Assessment/Plan    PT Assessment Patient needs continued PT services  PT Problem List Decreased strength;Decreased mobility;Decreased balance;Decreased knowledge of use of DME;Pain;Decreased knowledge of precautions;Decreased safety awareness       PT Treatment Interventions DME instruction;Therapeutic activities;Gait training;Therapeutic exercise;Patient/family education;Stair training;Functional mobility training    PT Goals (Current goals can be found in the Care Plan section)  Acute Rehab PT Goals Patient Stated Goal: to be independent as possible PT Goal Formulation: With patient Time For Goal Achievement: 12/20/19 Potential to Achieve Goals: Good    Frequency Min 5X/week   Barriers to discharge         Co-evaluation               AM-PAC PT "6 Clicks" Mobility  Outcome Measure Help needed turning from your back to your side while in a flat bed without using bedrails?: None Help needed moving from lying on your back to sitting on the side of a flat bed without using bedrails?: A Little Help needed moving to and from a bed to a chair (including a wheelchair)?: A Little Help needed standing up from a chair using your arms (e.g., wheelchair or bedside chair)?: A Little Help needed to walk in hospital room?: A Little Help needed climbing 3-5 steps with a railing? : A Little 6 Click Score: 19    End of Session Equipment Utilized During Treatment: Back brace Activity Tolerance: Patient tolerated treatment well Patient left: in chair;with call bell/phone within reach   PT Visit Diagnosis: Other abnormalities of gait and mobility (R26.89);Difficulty in walking, not elsewhere classified (R26.2)    Time: 2297-9892 PT Time Calculation (min) (ACUTE ONLY): 27 min   Charges:   PT Evaluation $PT Eval Moderate Complexity: 1 Mod PT Treatments $Gait Training: 8-22 mins        Magda Kiel, PT Acute Rehabilitation Services [phone] Office:[phone] 12/06/2019   Reginia Naas  12/06/2019, 4:13 PM

## 2019-12-06 NOTE — Plan of Care (Signed)
  Problem: Education: Goal: Ability to verbalize activity precautions or restrictions will improve Outcome: Progressing   Problem: Education: Goal: Knowledge of the prescribed therapeutic regimen will improve Outcome: Progressing   Problem: Education: Goal: Understanding of discharge needs will improve Outcome: Progressing

## 2019-12-06 NOTE — Progress Notes (Signed)
Patient ID: [name] [name], male   DOB: 01/31/1941, 79 y.o.   MRN: [id number] Vital signs are stable Patient's hematocrit is decreased to 27 this morning Renal function demonstrates creatinine up to 1.5 He is taking p.o. as well and I have encouraged him to drink lots of liquid We will ask rehabilitation medicine to see him regarding CIR He notes that his back pain is much improved We will consider obtaining CT of the lumbar spine prior to his discharge however we will see how he does with ambulation today Stable postop

## 2019-12-06 NOTE — Evaluation (Signed)
Occupational Therapy Evaluation Patient Details Name: [name] [name] MRN: [id number] DOB: 1940/10/22 Today's Date: 12/06/2019    History of Present Illness pt is a 79 y/o male with h/o HTN, L hip ORIF, THA then revision, L23 fusion 04/2018, admitted with neurogenic claudication and radiculopathy.  Pt s/p lami and decompression of L34 and L45 with PLIF, PLA, spacers and bone grafting.   Clinical Impression   Pt admitted with above. He demonstrates the below listed deficits and will benefit from continued OT to maximize safety and independence with BADLs.  Pt presents to OT with pain, bil. Shoulder weakness, generalized weakness.  He currently requires min A for ADLs and min guard assist for functional mobility.  He requires up to mod cues for back precautions and walker safety.  He lives with his wife, who recently underwent back surgery, and was mod I for ADLs, but requires assist for IADLs.  He reports they have a hired caregiver, and that their daughter also assists.  He is interested in a short CIR stay to allow him to maximize his independence.  If that is not an option, he prefers to discharge home with [facility] therapies. Will follow acutely.       Follow Up Recommendations  CIR;Supervision/Assistance - 24 hour    Equipment Recommendations  Other (comment) (possibly lift chair)    Recommendations for Other Services Rehab consult     Precautions / Restrictions Precautions Precautions: Back Precaution Booklet Issued: No Precaution Comments: Pt is able to independently state back precautions, but requires mod verbal cues to adhere to them as he is a bit impulsive and tangential often distracting self from task at hand.  Required Braces or Orthoses: Spinal Brace Spinal Brace: Lumbar corset;Applied in sitting position Restrictions Weight Bearing Restrictions: No      Mobility Bed Mobility Overal bed mobility: Needs Assistance Bed Mobility: Rolling;Sidelying to Sit;Sit to  Sidelying Rolling: Min assist Sidelying to sit: Mod assist     Sit to sidelying: Mod assist General bed mobility comments: Pt requires verbal cues for correct technique as he tends to reach for the grab bar before fully flexion knees causing him to twist.  He requires assist to fully roll, and assist to lift trunk from sidelying and lift feet back into bed   Transfers Overall transfer level: Needs assistance Equipment used: Rolling walker (2 wheeled) Transfers: Sit to/from [employer] Sit to Stand: Min guard Stand pivot transfers: Min guard       General transfer comment: requires mod verbal cues for correct hand placement and walker safety as he tends to push walker ahead of him and bend at the waist     Balance Overall balance assessment: Needs assistance Sitting-balance support: Feet supported Sitting balance-Leahy Scale: Good     Standing balance support: No upper extremity supported;During functional activity Standing balance-Leahy Scale: Fair Standing balance comment: able to maintain static standing with min guard assist                            ADL either performed or assessed with clinical judgement   ADL Overall ADL's : Needs assistance/impaired Eating/Feeding: Independent   Grooming: Wash/dry hands;Wash/dry face;Oral care;Brushing hair;Min guard;Standing Grooming Details (indicate cue type and reason): verbal cues for safety  Upper Body Bathing: Supervision/ safety;Sitting   Lower Body Bathing: Minimal assistance;Sit to/from stand Lower Body Bathing Details (indicate cue type and reason): able to perform figure 4 Upper Body Dressing : Set  up;Sitting   Lower Body Dressing: Minimal assistance;Sit to/from stand Lower Body Dressing Details (indicate cue type and reason): able to perform figure 4.  He reports he has elastic laces in his shoes  Toilet Transfer: Minimal assistance;RW Toilet Transfer Details (indicate cue type and reason):  assist for walker management and mod verbal cues for safety  Toileting- Clothing Manipulation and Hygiene: Min guard;Sit to/from stand       Functional mobility during ADLs: Surveyor, minerals     Praxis      Pertinent Vitals/Pain Pain Assessment: Faces Faces Pain Scale: Hurts little more Pain Location: back  Pain Descriptors / Indicators: Aching Pain Intervention(s): Monitored during session;Repositioned;Premedicated before session     Hand Dominance Right   Extremity/Trunk Assessment Upper Extremity Assessment Upper Extremity Assessment: LUE deficits/detail;RUE deficits/detail;Generalized weakness RUE Deficits / Details: Pt report bil. rotator cuff injuries that make it difficult to perform bed mobility  LUE Deficits / Details: Pt report bil. rotator cuff injuries that make it difficult to perform bed mobility   Lower Extremity Assessment Lower Extremity Assessment: Defer to PT evaluation   Cervical / Trunk Assessment Cervical / Trunk Assessment: Other exceptions Cervical / Trunk Exceptions: morbidly obese; s/p lumbar fusion    Communication Communication Communication: No difficulties   Cognition Arousal/Alertness: Awake/alert Behavior During Therapy: Impulsive Overall Cognitive Status: Within Functional Limits for tasks assessed                                 General Comments: Pt is verbose and tangential.  He often self distracts and requires up to mod cues for safety and precautions    General Comments       Exercises     Shoulder Instructions      Home Living Family/patient expects to be discharged to:: Private residence Living Arrangements: Spouse/significant other Available Help at Discharge: Family;Available 24 hours/day Type of Home: House Home Access: Stairs to enter [employer] of Steps: several Entrance Stairs-Rails: Left Home Layout: One level;Other (Comment)     Bathroom  Shower/Tub: Occupational psychologist: Handicapped height Bathroom Accessibility: Yes How Accessible: Accessible via walker Home Equipment: [place] - 4 wheels;Cane - single point;Bedside commode;Walker - 2 wheels;Grab bars - tub/shower;Adaptive equipment Adaptive Equipment: [place]acher Additional Comments: Pt reports wife had back surgery 3-4 weeks ago.  They have a hired caregiver, and daughter is present on caregiver's short days, but he and wife are alone after 1830 and on most of the weekends.  He reports  he had been renting a lift chair as he has been unable to get in and out of the bed, but recently returned the chair, but can rent it again if necessary at discharge       Prior Functioning/Environment Level of Independence: Needs assistance  Gait / Transfers Assistance Needed: Pt ambulates with RW  ADL's / Homemaking Assistance Needed: Pt reports he has been able to perform ADLs with set up assist - mod I using AE intermittently.   He requires assist with IADLs             OT Problem List: Decreased activity tolerance;Impaired balance (sitting and/or standing);Decreased safety awareness;Decreased knowledge of use of DME or AE;Decreased knowledge of precautions;Obesity;Pain      OT Treatment/Interventions: Self-care/ADL training;DME and/or AE instruction;Therapeutic activities;Patient/family education;Balance training    OT Goals(Current  goals can be found in the care plan section) Acute Rehab OT Goals Patient Stated Goal: to not have another surgery  OT Goal Formulation: With patient Time For Goal Achievement: 12/20/19 Potential to Achieve Goals: Good ADL Goals Pt Will Perform Grooming: with supervision;standing Pt Will Perform Lower Body Bathing: with supervision;sit to/from stand;with adaptive equipment Pt Will Perform Lower Body Dressing: with supervision;with adaptive equipment;sit to/from stand Pt Will Transfer to Toilet: with supervision;ambulating;regular height  toilet;grab bars Pt Will Perform Toileting - Clothing Manipulation and hygiene: with supervision;with adaptive equipment;sit to/from stand Pt Will Perform Tub/Shower Transfer: Shower transfer;with supervision;ambulating;shower seat;rolling walker;grab bars Additional ADL Goal #1: Pt will consistently adhere to back precautions during ADLs  OT Frequency: Min 2X/week   Barriers to D/C: Decreased caregiver support          Co-evaluation              AM-PAC OT "6 Clicks" Daily Activity     Outcome Measure Help from another person eating meals?: None Help from another person taking care of personal grooming?: A Little Help from another person toileting, which includes using toliet, bedpan, or urinal?: A Little Help from another person bathing (including washing, rinsing, drying)?: A Little Help from another person to put on and taking off regular upper body clothing?: A Little Help from another person to put on and taking off regular lower body clothing?: A Little 6 Click Score: 19   End of Session Equipment Utilized During Treatment: Gait belt;Rolling walker Nurse Communication: Mobility status;Precautions  Activity Tolerance: Patient tolerated treatment well Patient left: in bed;with call bell/phone within reach;with bed alarm set  OT Visit Diagnosis: Pain;Other abnormalities of gait and mobility (R26.89) Pain - part of body:  (back )                Time: 3500-9381 OT Time Calculation (min): 34 min Charges:  OT General Charges $OT Visit: 1 Visit OT Evaluation $OT Eval Moderate Complexity: 1 Mod OT Treatments $Self Care/Home Management : 8-22 mins  Nilsa Nutting., OTR/L Acute Rehabilitation Services Pager [phone] Office [place], [place] 12/06/2019, 10:37 AM

## 2019-12-07 LAB — GLUCOSE, CAPILLARY
Glucose-Capillary: 115 mg/dL — ABNORMAL HIGH (ref 70–99)
Glucose-Capillary: 140 mg/dL — ABNORMAL HIGH (ref 70–99)

## 2019-12-07 LAB — CBC
HCT: 26.5 % — ABNORMAL LOW (ref 39.0–52.0)
Hemoglobin: 7.9 g/dL — ABNORMAL LOW (ref 13.0–17.0)
MCH: 24.2 pg — ABNORMAL LOW (ref 26.0–34.0)
MCHC: 29.8 g/dL — ABNORMAL LOW (ref 30.0–36.0)
MCV: 81 fL (ref 80.0–100.0)
Platelets: 267 10*3/uL (ref 150–400)
RBC: 3.27 MIL/uL — ABNORMAL LOW (ref 4.22–5.81)
RDW: 15.9 % — ABNORMAL HIGH (ref 11.5–15.5)
WBC: 10.1 10*3/uL (ref 4.0–10.5)
nRBC: 0 % (ref 0.0–0.2)

## 2019-12-07 LAB — BASIC METABOLIC PANEL
Anion gap: 10 (ref 5–15)
BUN: 19 mg/dL (ref 8–23)
CO2: 24 mmol/L (ref 22–32)
Calcium: 8.6 mg/dL — ABNORMAL LOW (ref 8.9–10.3)
Chloride: 102 mmol/L (ref 98–111)
Creatinine, Ser: 1.41 mg/dL — ABNORMAL HIGH (ref 0.61–1.24)
GFR calc non Af Amer: 48 mL/min — ABNORMAL LOW (ref >60)
Glucose, Bld: 116 mg/dL — ABNORMAL HIGH (ref 70–99)
Potassium: 4.1 mmol/L (ref 3.5–5.1)
Sodium: 136 mmol/L (ref 135–145)

## 2019-12-07 MED ORDER — ASPIRIN EC 81 MG PO TBEC: DELAYED_RELEASE_TABLET | ORAL | 0 refills | Status: DC

## 2019-12-07 MED ORDER — OXYCODONE-ACETAMINOPHEN 5-325 MG PO TABS: 1.0000 | ORAL_TABLET | Freq: Four times a day (QID) | ORAL | 0 refills | Status: DC | PRN

## 2019-12-07 MED ORDER — METHOCARBAMOL 500 MG PO TABS: 500.0000 mg | ORAL_TABLET | Freq: Three times a day (TID) | ORAL | 3 refills | Status: DC

## 2019-12-07 MED ORDER — FISH OIL 1200 MG PO CAPS: ORAL_CAPSULE | ORAL | 0 refills | Status: AC

## 2019-12-07 MED ORDER — DICLOFENAC SODIUM 1 % EX GEL: CUTANEOUS | 3 refills | Status: DC

## 2019-12-07 MED ORDER — MELOXICAM 15 MG PO TABS: ORAL_TABLET | ORAL | 1 refills | Status: DC

## 2019-12-07 NOTE — Discharge Summary (Signed)
Physician Discharge Summary  Patient ID: [name] [name] MRN: [id number] DOB/AGE: [birth year] 79 y.o.  Admit date: 12/05/2019 Discharge date: 12/07/2019  Admission Diagnoses:Loss of fixation at L5 with bilateral severe L5 radiculopathy status post L3-L5 decompression and fusion 08/2019 subsidence of L4-L5  Discharge Diagnoses:  Active Problems:   Fixation hardware in spine Loss of fixation at L5 with bilateral severe L5 radiculopathy status post L3-L5 decompression and fusion 08/2019 subsidence of L4-L5. Discharged Condition: good  Hospital Course: the patient was admitted on 12/05/2019 and taken to the operating room for revision of decompression and fusion of L5 with fixation to the sacrum and the pelvis of L3-L5 arthrodesis, removal of L5 pedicle screws.. The patient tolerated the procedure well. The patient taken to the recovery room and then to the floor in stable condition. The hospital course was routine. There was no complications. The wound remains clean, dry and intact. The patient remained afebrile with stable vital signs, and tolerated a regular diet. The pain is well controlled with oral pain medications. He is ambulating with PT using a walker. He is feeling well and ready to go home. He is scheduled for follow up with Dr. Ellene Route in the office in 2-3 weeks. We will schedule CT lumbar scan in the near future.We ordered home health PT/OT and nursing care for patient. Dressing change daily with betadine swabs covered with dry dressing. We are discharging him to home with instructions regarding wound care and activities.  Consults: None  Treatments: Decompression fusion of L5 and S1 with fixation to the pelvis from L2 to the pelvis.  Discharge Exam: Blood pressure 97/60, pulse 68, temperature 98.6 F (37 C), temperature source Oral, resp. rate 16, height 5\' 11"  (1.803 m), weight 122.9 kg, SpO2 93 %. Alert and oriented x 4 PERRLA CN II-XII grossly intact MAE, Strength and sensation  intact, Strength plantarflexion and dorsiflexion bilaterally lower extremities: 5/5 Incision is covered with Honeycomb dressing and Steri Strips; Dressing is clean, dry, and intact  Disposition: Discharge disposition: 01-Home or Self Care       Discharge Instructions    Face-to-face encounter (required for Medicare/Medicaid patients)   Complete by: As directed    I Osie Cheeks certify that this patient is under my care and that I, or a nurse practitioner or physician's assistant working with me, had a face-to-face encounter that meets the physician face-to-face encounter requirements with this patient on 12/07/2019. The encounter with the patient was in whole, or in part for the following medical condition(s) which is the primary reason for home health care (List medical condition): fixation hardware in spine, bilateral severe L5 radiculopathy   The encounter with the patient was in whole, or in part, for the following medical condition, which is the primary reason for home health care: legs pain and leg weakness   I certify that, based on my findings, the following services are medically necessary home health services:  Physical therapy Nursing     Reason for Medically Necessary Home Health Services:  Therapy- Personnel officer, Public librarian Skilled Nursing- Complex Wound Care Skilled Nursing- Change/Decline in Patient Status     My clinical findings support the need for the above services: Pain interferes with ambulation/mobility   Further, I certify that my clinical findings support that this patient is homebound due to:  Pain interferes with ambulation/mobility Unsafe ambulation due to balance issues     Home Health   Complete by: As directed    To provide the  following care/treatments:  PT OT RN     Incentive spirometry RT   Complete by: As directed      Allergies as of 12/07/2019      Reactions   Iodinated Diagnostic Agents Shortness Of Breath   Iohexol  Shortness Of Breath      Medication List    TAKE these medications   aspirin EC 81 MG tablet Restart on 12/11/2019 What changed:   how much to take  how to take this  when to take this  additional instructions   b complex vitamins tablet Take 1 tablet by mouth daily.   bisoprolol-hydrochlorothiazide 5-6.25 MG tablet Commonly known as: ZIAC Take 1 tablet Daily for BP What changed:   how much to take  how to take this  when to take this   diclofenac Sodium 1 % Gel Commonly known as: VOLTAREN Restart 10/11 What changed:   how much to take  how to take this  when to take this  additional instructions   fenofibrate 145 MG tablet Commonly known as: TRICOR Take 1 tablet Daily for Triglycerides (Blood Fats) What changed:   how much to take  how to take this  when to take this  additional instructions   finasteride 5 MG tablet Commonly known as: PROSCAR Take 5 mg by mouth daily.   Fish Oil 1200 MG Caps Restart on 10.11 What changed:   how much to take  how to take this  when to take this  additional instructions   FLAX SEEDS PO Take 1,000 mg by mouth daily.   gabapentin 300 MG capsule Commonly known as: NEURONTIN Take 300 mg by mouth 3 (three) times daily.   hyoscyamine 0.125 MG SL tablet Commonly known as: LEVSIN SL PLACE 1 TABLET UNDER THE TONGUE DAILY AS NEEDED. What changed: See the new instructions.   losartan 100 MG tablet Commonly known as: COZAAR TAKE 1 TABLET BY MOUTH DAILY FOR BLOOD PRESSURE What changed:   how much to take  how to take this  when to take this  additional instructions   Magnesium Oxide 250 MG Tabs Take 250 mg by mouth 2 (two) times daily.   meloxicam 15 MG tablet Commonly known as: MOBIC Restart on 10.11 What changed: additional instructions   methocarbamol 500 MG tablet Commonly known as: Robaxin Take 1 tablet (500 mg total) by mouth 3 (three) times daily.   oxyCODONE-acetaminophen 5-325  MG tablet Commonly known as: Percocet Take 1-2 tablets by mouth every 6 (six) hours as needed for moderate pain or severe pain (as needed). What changed:   when to take this  reasons to take this   pantoprazole 40 MG tablet Commonly known as: PROTONIX TAKE 1 TABLET BY MOUTH TWICE A DAY.   sildenafil 100 MG tablet Commonly known as: VIAGRA Take 100 mg by mouth daily as needed for erectile dysfunction.   tamsulosin 0.4 MG Caps capsule Commonly known as: FLOMAX Take 0.4 mg by mouth daily after breakfast.   vitamin B-12 1000 MCG tablet Commonly known as: CYANOCOBALAMIN Take 1 tablet (1,000 mcg total) by mouth daily.   Vitamin D 125 MCG (5000 UT) Caps Take 5,000 Units by mouth daily.   VITAMIN D PO Take 5,000 Units by mouth daily.        Signed: Osie Cheeks 12/07/2019, 12:14 PM

## 2019-12-07 NOTE — Progress Notes (Signed)
Rehab Admissions Coordinator Note:  Patient was screened by Cleatrice Burke for appropriateness for an Inpatient Acute Rehab Consult per therapy recommendation sand per patient request. If patient fails to progress to d/c home , would recommend rehab conuslt, but unlikley to get approval from  [facility] at this level.  Cleatrice Burke RN MSN 12/07/2019, 9:39 AM  I can be reached at [phone].

## 2019-12-07 NOTE — Progress Notes (Signed)
Physical Therapy Treatment Patient Details Name: [name] [name] MRN: [id number] DOB: 09-Jan-1941 Today's Date: 12/07/2019    History of Present Illness pt is a 79 y/o male with h/o HTN, L hip ORIF, THA then revision, L23 fusion 04/2018, admitted with neurogenic claudication and radiculopathy.  Pt s/p lami and decompression of L34 and L45 with PLIF, PLA, spacers and bone grafting.    PT Comments    Focus of session on stair training and bed mobility even though he plans to sleep in the recliner, discussed importance of stretching out to prevent hip flexion contracture.  Continues to have flexed posture at times with ambulation but improves when he stops to rest his arms.  Feel he is likely able to progress to home with wife support, HHPT and plans to sleep in recliner.  PT to continue to follow acutely.    Follow Up Recommendations  Home health PT     Equipment Recommendations  None recommended by PT    Recommendations for Other Services       Precautions / Restrictions Precautions Precautions: Back;Fall Precaution Comments: min intermittent cues for precautions twice reaching out ahead of him in sitting, but readjusting posture and performing bed mobility appropriately Required Braces or Orthoses: Spinal Brace Spinal Brace: Lumbar corset;Applied in standing position Restrictions Weight Bearing Restrictions: No    Mobility  Bed Mobility Overal bed mobility: Needs Assistance   Rolling: Supervision Sidelying to sit: Min assist     Sit to sidelying: Mod assist General bed mobility comments: used rail to assist, needed help lifting legs into bed, and for trunk to upright cues for momentum lifting trunk as lowering legs  Transfers Overall transfer level: Needs assistance Equipment used: Rolling walker (2 wheeled) Transfers: Sit to/from Stand Sit to Stand: Supervision         General transfer comment: appropriate hand placement on armrests and  bed  Ambulation/Gait Ambulation/Gait assistance: Min guard Gait Distance (Feet): 220 Feet Assistive device: Rolling walker (2 wheeled) Gait Pattern/deviations: Step-through pattern;Decreased stride length;Trunk flexed;Trendelenburg     General Gait Details: cues for stopping to rest arms when they get tired as he can stand up tall when not walking, but flexed and heavy UE support with ambulation   Stairs Stairs: Yes   Stair Management: With walker;One rail Right;Step to pattern;Forwards Number of Stairs: 3 General stair comments: demonstrates his technique with rail on R and moving walker up the steps, to descend wanting to put walker down more than one step, but educated would be flexed at waist if he did so; used one step at a time pt managing walker and holding rail   Wheelchair Mobility    Modified Rankin (Stroke Patients Only)       Balance Overall balance assessment: Needs assistance   Sitting balance-Leahy Scale: Good     Standing balance support: No upper extremity supported Standing balance-Leahy Scale: Fair                              Cognition Arousal/Alertness: Awake/alert Behavior During Therapy: WFL for tasks assessed/performed Overall Cognitive Status: Within Functional Limits for tasks assessed                                        Exercises      General Comments General comments (skin integrity, edema, etc.): donned brace standing  with S; practiced bed mobility and encouraged lying flat on the bed when he can to avoid hip flexion contracture, discussed practicing with PT at home if he plans to sleep in the recliner      Pertinent Vitals/Pain Pain Assessment: Faces Faces Pain Scale: Hurts little more Pain Location: back Pain Descriptors / Indicators: Tightness Pain Intervention(s): Monitored during session;Repositioned    Home Living                      Prior Function            PT Goals (current  goals can now be found in the care plan section) Progress towards PT goals: Progressing toward goals    Frequency    Min 5X/week      PT Plan Discharge plan needs to be updated    Co-evaluation              AM-PAC PT "6 Clicks" Mobility   Outcome Measure  Help needed turning from your back to your side while in a flat bed without using bedrails?: None Help needed moving from lying on your back to sitting on the side of a flat bed without using bedrails?: A Little Help needed moving to and from a bed to a chair (including a wheelchair)?: A Little Help needed standing up from a chair using your arms (e.g., wheelchair or bedside chair)?: A Little Help needed to walk in hospital room?: A Little Help needed climbing 3-5 steps with a railing? : A Little 6 Click Score: 19    End of Session Equipment Utilized During Treatment: Back brace Activity Tolerance: Patient tolerated treatment well Patient left: in chair;with call bell/phone within reach   PT Visit Diagnosis: Other abnormalities of gait and mobility (R26.89);Difficulty in walking, not elsewhere classified (R26.2)     Time: 6979-4801 PT Time Calculation (min) (ACUTE ONLY): 35 min  Charges:  $Gait Training: 8-22 mins $Therapeutic Activity: 8-22 mins                     Magda Kiel, PT Acute Rehabilitation Services [phone] Office:[phone] 12/07/2019    Reginia Naas 12/07/2019, 10:45 AM

## 2019-12-07 NOTE — TOC Transition Note (Signed)
Transition of Care (TOC) - CM/SW Discharge Note Marvetta Gibbons RN,BSN Transitions of Care Unit 4NP (non trauma) - RN Case Manager See Treatment Team for direct Phone #   Patient Details  Name: [name] [name] MRN: [id number] Date of Birth: [birth year]  Transition of Care [facility]) CM/SW Contact:  Dawayne Patricia, RN Phone Number: 12/07/2019, 3:51 PM   Clinical Narrative:    Pt stable for transition home today, orders placed for HHRN/PT/OT- Cm spoke with pt at bedside regarding transition needs- per pt he has all needed DME- daughter to come and transport home this evening.  Choice offered for [facility] agency- Per CMS guidelines from medicare.gov website with star ratings (copy placed in shadow chart)- pt informs this CM that his wife has [place] coming out to home and he would like to use same agency if possible- agency is [facility]-  Call made to Amy with [facility] to see if referral could be accepted- referral has been accepted for RN/PT/OT services- they can do a start of care tomorrow- however nursing can not get out to home until Monday- as per Amy- PT/OT will assess drsg and will call office if they see anything they feel needs to be addressed sooner than Monday- bedside RN to teach daughter drsg change needs prior to discharge and provide supplies.  Pt will be seen by PT/OT tomorrow.  Pt is agreeable to this.   Final next level of care: [place] Barriers to Discharge: No Barriers Identified   Patient Goals and CMS Choice Patient states their goals for this hospitalization and ongoing recovery are:: return home CMS Medicare.gov Compare Post Acute Care list provided to:: Patient Choice offered to / list presented to : Patient  Discharge Placement               Home with [facility]        Discharge Plan and Services   Discharge Planning Services: CM Consult Post Acute Care Choice: Home Health          DME Arranged: N/A DME Agency: NA       HH Arranged:  RN, PT, OT HH Agency: [facility] Date [place]: 12/07/19 Time [place]: 1245 Representative spoke with at [place]: Amy  Social Determinants of Health ([place]) Interventions     Readmission Risk Interventions Readmission Risk Prevention Plan 12/07/2019  Post Dischage Appt Complete  Medication Screening Complete  Transportation Screening Complete  Some recent data might be hidden

## 2019-12-07 NOTE — Progress Notes (Signed)
Pt with discharge orders. Discharge paperwork reviewed with pt and all questions answered. IVs removed. Prescriptions electronically faxed to pharmacy. RN educated daughter on dressing changes that will need to be done throughout the weekend and supplies provided to last. Pt escorted out via wheelchair to private vehicle with all belongings,

## 2019-12-07 NOTE — Progress Notes (Signed)
Occupational Therapy Treatment Patient Details Name: [name] [name] MRN: [id number] DOB: 12-02-1940 Today's Date: 12/07/2019    History of present illness pt is a 79 y/o male with h/o HTN, L hip ORIF, THA then revision, L23 fusion 04/2018, admitted with neurogenic claudication and radiculopathy.  Pt s/p lami and decompression of L34 and L45 with PLIF, PLA, spacers and bone grafting.   OT comments  Pt making steady progress towards OT goals this session. Pt sitting in recliner upon OTA arrival agreeable to intervention. Pt continues to present with pain, decreased activity tolerance, and back precautions impacting pts ability to complete BADLs independently. Overall, pt requires min guard for functional mobility with RW and requires cues to maintain back precautions during ADLs. Pt was able to state 3/3 precautions but seems to get distracted during ADL tasks needing MIN cues to carryover compensatory methods during session. Pt would continue to benefit from skilled occupational therapy while admitted and after d/c to address the below listed limitations in order to improve overall functional mobility and facilitate independence with BADL participation. DC plan remains appropriate, will follow acutely per POC.      Follow Up Recommendations  CIR;Supervision/Assistance - 24 hour    Equipment Recommendations  Other (comment) (possibly lift chair)    Recommendations for Other Services      Precautions / Restrictions Precautions Precautions: Back;Fall Precaution Booklet Issued: No Precaution Comments: pt able to state all back preacautions but required cues to incorporate into ADL routine Required Braces or Orthoses: Spinal Brace Spinal Brace: Lumbar corset;Applied in standing position Restrictions Weight Bearing Restrictions: No       Mobility Bed Mobility General bed mobility comments: pt OOB in recliner upon arrival  Transfers Overall transfer level: Needs assistance Equipment  used: Rolling walker (2 wheeled) Transfers: Sit to/from Stand Sit to Stand: Min guard         General transfer comment: pt able to power into standing from recliner with min guard for safety, good placement of UEs on arms of chair    Balance Overall balance assessment: Needs assistance Sitting-balance support: Feet supported Sitting balance-Leahy Scale: Good     Standing balance support: No upper extremity supported;During functional activity Standing balance-Leahy Scale: Fair Standing balance comment: pt able to stand at sink to complete UB ADLs with close supervision with no LOB                           ADL either performed or assessed with clinical judgement   ADL Overall ADL's : Needs assistance/impaired     Grooming: Oral care;Wash/dry face;Min guard;Standing Grooming Details (indicate cue type and reason): pt required cues to incorporate compensatory methods into ADL routine and cue to stand up straight at sink vs lean on sink bending trunk         Upper Body Dressing : Set up;Sitting Upper Body Dressing Details (indicate cue type and reason): able to don brace with set- up assist from chair   Lower Body Dressing Details (indicate cue type and reason): able to perform figure 4.  He reports he has elastic laces in his shoes  Toilet Transfer: Min Fish farm manager Details (indicate cue type and reason): simulated via functional mobility from recliner>bathroom with RW and min guard assist, min cues for RW mgmt while standing at sink as pt noted to keep RW too far in front of pt   Toileting - Clothing Manipulation Details (indicate cue type and reason): pt  reports being able to laterally lean to complete posterior pericare   Tub/Shower Transfer Details (indicate cue type and reason): pt reports seat in walkin shower Functional mobility during ADLs: Min guard;Rolling walker General ADL Comments: pt continues to present with pain, decreased activity  tolerance and back precautions limiting pts ability to complete BADLS independently, however pt reports multiple back surgeries and seems very well versed in recovery     Vision       Perception     Praxis      Cognition Arousal/Alertness: Awake/alert Behavior During Therapy: WFL for tasks assessed/performed Overall Cognitive Status: Within Functional Limits for tasks assessed                                 General Comments: pt able to state precautions but requires cues to incorporate precautions functionally        Exercises     Shoulder Instructions       General Comments pt able to don brace from sitting in chair with supervision/ set- up assist.    Pertinent Vitals/ Pain       Pain Assessment: Faces Faces Pain Scale: Hurts little more Pain Location: back Pain Descriptors / Indicators: Tightness;Discomfort;Grimacing Pain Intervention(s): Monitored during session;Repositioned  Home Living                                          Prior Functioning/Environment              Frequency  Min 2X/week        Progress Toward Goals  OT Goals(current goals can now be found in the care plan section)  Progress towards OT goals: Progressing toward goals  Acute Rehab OT Goals Patient Stated Goal: to be independent as possible OT Goal Formulation: With patient Time For Goal Achievement: 12/20/19 Potential to Achieve Goals: Good  Plan Discharge plan remains appropriate;Frequency remains appropriate    Co-evaluation                 AM-PAC OT "6 Clicks" Daily Activity     Outcome Measure   Help from another person eating meals?: None Help from another person taking care of personal grooming?: A Little Help from another person toileting, which includes using toliet, bedpan, or urinal?: A Little Help from another person bathing (including washing, rinsing, drying)?: A Little Help from another person to put on and taking  off regular upper body clothing?: A Little Help from another person to put on and taking off regular lower body clothing?: A Little 6 Click Score: 19    End of Session Equipment Utilized During Treatment: Rolling walker;Back brace  OT Visit Diagnosis: Pain;Other abnormalities of gait and mobility (R26.89)   Activity Tolerance Patient tolerated treatment well   Patient Left in chair;with call bell/phone within reach   Nurse Communication Mobility status        Time: 8032-1224 OT Time Calculation (min): 13 min  Charges: OT General Charges $OT Visit: 1 Visit OT Treatments $Self Care/Home Management : 8-22 mins  Lanier Clam., COTA/L Acute Rehabilitation Services [phone]    [name] [name] 12/07/2019, 10:58 AM

## 2019-12-07 NOTE — Discharge Instructions (Signed)
Discharge Instructions  slowly increase your activity back to normal. No bending, no heavy lifting >10 lbs, no twisting. Dressing change by nursing- betadine swabs over the wound with dry dressing daily. Send home with Percoset and methocarbamol.  Your incision is closed with dermabond (purple glue). This will naturally fall off over the next 1-2 weeks.   Okay to shower on the day of discharge. Use regular soap and water and try to be gentle when cleaning your incision.   Follow up with Dr.Elsner in 2-3 weeks after discharge. If you do not already have a discharge appointment, please call his office at [phone] to schedule a follow up appointment. If you have any concerns or questions, please call the office and let us know.

## 2019-12-08 ENCOUNTER — Telehealth: Payer: Self-pay

## 2019-12-08 NOTE — Telephone Encounter (Signed)
Patient states that the hospitalist told him after back surgery not to start back on Meloxicam until October 11th.  His arthritis in his foot is bothering him. Would like to know if its okay to start back now?

## 2019-12-11 ENCOUNTER — Telehealth: Payer: Self-pay | Admitting: *Deleted

## 2019-12-11 DIAGNOSIS — R2681 Unsteadiness on feet: Secondary | ICD-10-CM | POA: Diagnosis not present

## 2019-12-11 DIAGNOSIS — N183 Chronic kidney disease, stage 3 unspecified: Secondary | ICD-10-CM | POA: Diagnosis not present

## 2019-12-11 DIAGNOSIS — I509 Heart failure, unspecified: Secondary | ICD-10-CM | POA: Diagnosis not present

## 2019-12-11 DIAGNOSIS — I130 Hypertensive heart and chronic kidney disease with heart failure and stage 1 through stage 4 chronic kidney disease, or unspecified chronic kidney disease: Secondary | ICD-10-CM | POA: Diagnosis not present

## 2019-12-11 DIAGNOSIS — Z967 Presence of other bone and tendon implants: Secondary | ICD-10-CM | POA: Diagnosis not present

## 2019-12-11 DIAGNOSIS — Z4789 Encounter for other orthopedic aftercare: Secondary | ICD-10-CM | POA: Diagnosis not present

## 2019-12-11 NOTE — Telephone Encounter (Signed)
Per Dr Melford Aase, [place] for OT 1 time a week for 3 weeks, but PT must get orders for Dr Ellene Route, who did his surgery.

## 2019-12-14 DIAGNOSIS — I509 Heart failure, unspecified: Secondary | ICD-10-CM | POA: Diagnosis not present

## 2019-12-14 DIAGNOSIS — I130 Hypertensive heart and chronic kidney disease with heart failure and stage 1 through stage 4 chronic kidney disease, or unspecified chronic kidney disease: Secondary | ICD-10-CM | POA: Diagnosis not present

## 2019-12-14 DIAGNOSIS — Z4789 Encounter for other orthopedic aftercare: Secondary | ICD-10-CM | POA: Diagnosis not present

## 2019-12-14 DIAGNOSIS — Z967 Presence of other bone and tendon implants: Secondary | ICD-10-CM | POA: Diagnosis not present

## 2019-12-14 DIAGNOSIS — N183 Chronic kidney disease, stage 3 unspecified: Secondary | ICD-10-CM | POA: Diagnosis not present

## 2019-12-14 DIAGNOSIS — R2681 Unsteadiness on feet: Secondary | ICD-10-CM | POA: Diagnosis not present

## 2019-12-19 DIAGNOSIS — N183 Chronic kidney disease, stage 3 unspecified: Secondary | ICD-10-CM | POA: Diagnosis not present

## 2019-12-19 DIAGNOSIS — Z967 Presence of other bone and tendon implants: Secondary | ICD-10-CM | POA: Diagnosis not present

## 2019-12-19 DIAGNOSIS — R2681 Unsteadiness on feet: Secondary | ICD-10-CM | POA: Diagnosis not present

## 2019-12-19 DIAGNOSIS — Z4789 Encounter for other orthopedic aftercare: Secondary | ICD-10-CM | POA: Diagnosis not present

## 2019-12-19 DIAGNOSIS — I130 Hypertensive heart and chronic kidney disease with heart failure and stage 1 through stage 4 chronic kidney disease, or unspecified chronic kidney disease: Secondary | ICD-10-CM | POA: Diagnosis not present

## 2019-12-19 DIAGNOSIS — I509 Heart failure, unspecified: Secondary | ICD-10-CM | POA: Diagnosis not present

## 2019-12-21 DIAGNOSIS — I509 Heart failure, unspecified: Secondary | ICD-10-CM | POA: Diagnosis not present

## 2019-12-21 DIAGNOSIS — R2681 Unsteadiness on feet: Secondary | ICD-10-CM | POA: Diagnosis not present

## 2019-12-21 DIAGNOSIS — Z967 Presence of other bone and tendon implants: Secondary | ICD-10-CM | POA: Diagnosis not present

## 2019-12-21 DIAGNOSIS — N183 Chronic kidney disease, stage 3 unspecified: Secondary | ICD-10-CM | POA: Diagnosis not present

## 2019-12-21 DIAGNOSIS — Z4789 Encounter for other orthopedic aftercare: Secondary | ICD-10-CM | POA: Diagnosis not present

## 2019-12-21 DIAGNOSIS — I130 Hypertensive heart and chronic kidney disease with heart failure and stage 1 through stage 4 chronic kidney disease, or unspecified chronic kidney disease: Secondary | ICD-10-CM | POA: Diagnosis not present

## 2019-12-22 DIAGNOSIS — I509 Heart failure, unspecified: Secondary | ICD-10-CM | POA: Diagnosis not present

## 2019-12-22 DIAGNOSIS — N183 Chronic kidney disease, stage 3 unspecified: Secondary | ICD-10-CM | POA: Diagnosis not present

## 2019-12-22 DIAGNOSIS — R2681 Unsteadiness on feet: Secondary | ICD-10-CM | POA: Diagnosis not present

## 2019-12-22 DIAGNOSIS — Z4789 Encounter for other orthopedic aftercare: Secondary | ICD-10-CM | POA: Diagnosis not present

## 2019-12-22 DIAGNOSIS — Z967 Presence of other bone and tendon implants: Secondary | ICD-10-CM | POA: Diagnosis not present

## 2019-12-22 DIAGNOSIS — I130 Hypertensive heart and chronic kidney disease with heart failure and stage 1 through stage 4 chronic kidney disease, or unspecified chronic kidney disease: Secondary | ICD-10-CM | POA: Diagnosis not present

## 2019-12-26 DIAGNOSIS — N183 Chronic kidney disease, stage 3 unspecified: Secondary | ICD-10-CM | POA: Diagnosis not present

## 2019-12-26 DIAGNOSIS — Z967 Presence of other bone and tendon implants: Secondary | ICD-10-CM | POA: Diagnosis not present

## 2019-12-26 DIAGNOSIS — I130 Hypertensive heart and chronic kidney disease with heart failure and stage 1 through stage 4 chronic kidney disease, or unspecified chronic kidney disease: Secondary | ICD-10-CM | POA: Diagnosis not present

## 2019-12-26 DIAGNOSIS — Z4789 Encounter for other orthopedic aftercare: Secondary | ICD-10-CM | POA: Diagnosis not present

## 2019-12-26 DIAGNOSIS — I509 Heart failure, unspecified: Secondary | ICD-10-CM | POA: Diagnosis not present

## 2019-12-26 DIAGNOSIS — R2681 Unsteadiness on feet: Secondary | ICD-10-CM | POA: Diagnosis not present

## 2019-12-27 DIAGNOSIS — M48062 Spinal stenosis, lumbar region with neurogenic claudication: Secondary | ICD-10-CM | POA: Diagnosis not present

## 2019-12-29 ENCOUNTER — Other Ambulatory Visit: Payer: Self-pay | Admitting: Internal Medicine

## 2020-01-02 DIAGNOSIS — I509 Heart failure, unspecified: Secondary | ICD-10-CM | POA: Diagnosis not present

## 2020-01-02 DIAGNOSIS — N183 Chronic kidney disease, stage 3 unspecified: Secondary | ICD-10-CM | POA: Diagnosis not present

## 2020-01-02 DIAGNOSIS — Z4789 Encounter for other orthopedic aftercare: Secondary | ICD-10-CM | POA: Diagnosis not present

## 2020-01-02 DIAGNOSIS — I130 Hypertensive heart and chronic kidney disease with heart failure and stage 1 through stage 4 chronic kidney disease, or unspecified chronic kidney disease: Secondary | ICD-10-CM | POA: Diagnosis not present

## 2020-01-02 DIAGNOSIS — Z967 Presence of other bone and tendon implants: Secondary | ICD-10-CM | POA: Diagnosis not present

## 2020-01-02 DIAGNOSIS — R2681 Unsteadiness on feet: Secondary | ICD-10-CM | POA: Diagnosis not present

## 2020-01-09 DIAGNOSIS — R2681 Unsteadiness on feet: Secondary | ICD-10-CM | POA: Diagnosis not present

## 2020-01-09 DIAGNOSIS — Z4789 Encounter for other orthopedic aftercare: Secondary | ICD-10-CM | POA: Diagnosis not present

## 2020-01-09 DIAGNOSIS — I509 Heart failure, unspecified: Secondary | ICD-10-CM | POA: Diagnosis not present

## 2020-01-09 DIAGNOSIS — N183 Chronic kidney disease, stage 3 unspecified: Secondary | ICD-10-CM | POA: Diagnosis not present

## 2020-01-09 DIAGNOSIS — I130 Hypertensive heart and chronic kidney disease with heart failure and stage 1 through stage 4 chronic kidney disease, or unspecified chronic kidney disease: Secondary | ICD-10-CM | POA: Diagnosis not present

## 2020-01-09 DIAGNOSIS — Z967 Presence of other bone and tendon implants: Secondary | ICD-10-CM | POA: Diagnosis not present

## 2020-01-10 DIAGNOSIS — I130 Hypertensive heart and chronic kidney disease with heart failure and stage 1 through stage 4 chronic kidney disease, or unspecified chronic kidney disease: Secondary | ICD-10-CM | POA: Diagnosis not present

## 2020-01-10 DIAGNOSIS — N183 Chronic kidney disease, stage 3 unspecified: Secondary | ICD-10-CM | POA: Diagnosis not present

## 2020-01-10 DIAGNOSIS — Z967 Presence of other bone and tendon implants: Secondary | ICD-10-CM | POA: Diagnosis not present

## 2020-01-10 DIAGNOSIS — I509 Heart failure, unspecified: Secondary | ICD-10-CM | POA: Diagnosis not present

## 2020-01-10 DIAGNOSIS — Z4789 Encounter for other orthopedic aftercare: Secondary | ICD-10-CM | POA: Diagnosis not present

## 2020-01-10 DIAGNOSIS — R2681 Unsteadiness on feet: Secondary | ICD-10-CM | POA: Diagnosis not present

## 2020-01-11 DIAGNOSIS — M5126 Other intervertebral disc displacement, lumbar region: Secondary | ICD-10-CM | POA: Diagnosis not present

## 2020-01-11 DIAGNOSIS — Z4789 Encounter for other orthopedic aftercare: Secondary | ICD-10-CM | POA: Diagnosis not present

## 2020-01-11 DIAGNOSIS — R531 Weakness: Secondary | ICD-10-CM | POA: Diagnosis not present

## 2020-01-11 DIAGNOSIS — Z96642 Presence of left artificial hip joint: Secondary | ICD-10-CM | POA: Diagnosis not present

## 2020-01-23 DIAGNOSIS — R531 Weakness: Secondary | ICD-10-CM | POA: Diagnosis not present

## 2020-01-23 DIAGNOSIS — M5126 Other intervertebral disc displacement, lumbar region: Secondary | ICD-10-CM | POA: Diagnosis not present

## 2020-01-23 DIAGNOSIS — Z4789 Encounter for other orthopedic aftercare: Secondary | ICD-10-CM | POA: Diagnosis not present

## 2020-01-23 DIAGNOSIS — Z96642 Presence of left artificial hip joint: Secondary | ICD-10-CM | POA: Diagnosis not present

## 2020-01-31 DIAGNOSIS — Z96642 Presence of left artificial hip joint: Secondary | ICD-10-CM | POA: Diagnosis not present

## 2020-01-31 DIAGNOSIS — R531 Weakness: Secondary | ICD-10-CM | POA: Diagnosis not present

## 2020-01-31 DIAGNOSIS — M5126 Other intervertebral disc displacement, lumbar region: Secondary | ICD-10-CM | POA: Diagnosis not present

## 2020-01-31 DIAGNOSIS — Z4789 Encounter for other orthopedic aftercare: Secondary | ICD-10-CM | POA: Diagnosis not present

## 2020-02-02 DIAGNOSIS — Z4789 Encounter for other orthopedic aftercare: Secondary | ICD-10-CM | POA: Diagnosis not present

## 2020-02-02 DIAGNOSIS — R531 Weakness: Secondary | ICD-10-CM | POA: Diagnosis not present

## 2020-02-02 DIAGNOSIS — Z96642 Presence of left artificial hip joint: Secondary | ICD-10-CM | POA: Diagnosis not present

## 2020-02-02 DIAGNOSIS — M5126 Other intervertebral disc displacement, lumbar region: Secondary | ICD-10-CM | POA: Diagnosis not present

## 2020-02-05 DIAGNOSIS — Z4789 Encounter for other orthopedic aftercare: Secondary | ICD-10-CM | POA: Diagnosis not present

## 2020-02-05 DIAGNOSIS — Z96642 Presence of left artificial hip joint: Secondary | ICD-10-CM | POA: Diagnosis not present

## 2020-02-05 DIAGNOSIS — R531 Weakness: Secondary | ICD-10-CM | POA: Diagnosis not present

## 2020-02-05 DIAGNOSIS — M5126 Other intervertebral disc displacement, lumbar region: Secondary | ICD-10-CM | POA: Diagnosis not present

## 2020-02-07 DIAGNOSIS — M48062 Spinal stenosis, lumbar region with neurogenic claudication: Secondary | ICD-10-CM | POA: Diagnosis not present

## 2020-02-07 DIAGNOSIS — Z96642 Presence of left artificial hip joint: Secondary | ICD-10-CM | POA: Diagnosis not present

## 2020-02-07 DIAGNOSIS — Z4789 Encounter for other orthopedic aftercare: Secondary | ICD-10-CM | POA: Diagnosis not present

## 2020-02-07 DIAGNOSIS — R531 Weakness: Secondary | ICD-10-CM | POA: Diagnosis not present

## 2020-02-07 DIAGNOSIS — M5126 Other intervertebral disc displacement, lumbar region: Secondary | ICD-10-CM | POA: Diagnosis not present

## 2020-02-09 ENCOUNTER — Other Ambulatory Visit: Payer: Self-pay | Admitting: Internal Medicine

## 2020-02-09 DIAGNOSIS — Z4789 Encounter for other orthopedic aftercare: Secondary | ICD-10-CM | POA: Diagnosis not present

## 2020-02-09 DIAGNOSIS — R531 Weakness: Secondary | ICD-10-CM | POA: Diagnosis not present

## 2020-02-09 DIAGNOSIS — M5126 Other intervertebral disc displacement, lumbar region: Secondary | ICD-10-CM | POA: Diagnosis not present

## 2020-02-09 DIAGNOSIS — Z96642 Presence of left artificial hip joint: Secondary | ICD-10-CM | POA: Diagnosis not present

## 2020-02-12 DIAGNOSIS — Z4789 Encounter for other orthopedic aftercare: Secondary | ICD-10-CM | POA: Diagnosis not present

## 2020-02-12 DIAGNOSIS — Z96642 Presence of left artificial hip joint: Secondary | ICD-10-CM | POA: Diagnosis not present

## 2020-02-12 DIAGNOSIS — R531 Weakness: Secondary | ICD-10-CM | POA: Diagnosis not present

## 2020-02-12 DIAGNOSIS — M5126 Other intervertebral disc displacement, lumbar region: Secondary | ICD-10-CM | POA: Diagnosis not present

## 2020-02-13 ENCOUNTER — Other Ambulatory Visit: Payer: Self-pay

## 2020-02-13 ENCOUNTER — Ambulatory Visit (INDEPENDENT_AMBULATORY_CARE_PROVIDER_SITE_OTHER): Payer: Medicare Other | Admitting: Adult Health Nurse Practitioner

## 2020-02-13 ENCOUNTER — Encounter: Payer: Self-pay | Admitting: Adult Health Nurse Practitioner

## 2020-02-13 VITALS — BP 126/78 | HR 71 | Temp 97.7°F | Ht 71.0 in | Wt 266.0 lb

## 2020-02-13 DIAGNOSIS — I10 Essential (primary) hypertension: Secondary | ICD-10-CM

## 2020-02-13 DIAGNOSIS — N1831 Chronic kidney disease, stage 3a: Secondary | ICD-10-CM

## 2020-02-13 DIAGNOSIS — E1122 Type 2 diabetes mellitus with diabetic chronic kidney disease: Secondary | ICD-10-CM | POA: Diagnosis not present

## 2020-02-13 DIAGNOSIS — R6889 Other general symptoms and signs: Secondary | ICD-10-CM

## 2020-02-13 DIAGNOSIS — E559 Vitamin D deficiency, unspecified: Secondary | ICD-10-CM | POA: Diagnosis not present

## 2020-02-13 DIAGNOSIS — N200 Calculus of kidney: Secondary | ICD-10-CM

## 2020-02-13 DIAGNOSIS — E785 Hyperlipidemia, unspecified: Secondary | ICD-10-CM

## 2020-02-13 DIAGNOSIS — E538 Deficiency of other specified B group vitamins: Secondary | ICD-10-CM

## 2020-02-13 DIAGNOSIS — E210 Primary hyperparathyroidism: Secondary | ICD-10-CM

## 2020-02-13 DIAGNOSIS — M167 Other unilateral secondary osteoarthritis of hip: Secondary | ICD-10-CM

## 2020-02-13 DIAGNOSIS — T466X5A Adverse effect of antihyperlipidemic and antiarteriosclerotic drugs, initial encounter: Secondary | ICD-10-CM

## 2020-02-13 DIAGNOSIS — M791 Myalgia, unspecified site: Secondary | ICD-10-CM

## 2020-02-13 DIAGNOSIS — Z0001 Encounter for general adult medical examination with abnormal findings: Secondary | ICD-10-CM

## 2020-02-13 DIAGNOSIS — Z0000 Encounter for general adult medical examination without abnormal findings: Secondary | ICD-10-CM

## 2020-02-13 DIAGNOSIS — K219 Gastro-esophageal reflux disease without esophagitis: Secondary | ICD-10-CM

## 2020-02-13 DIAGNOSIS — E1169 Type 2 diabetes mellitus with other specified complication: Secondary | ICD-10-CM | POA: Diagnosis not present

## 2020-02-13 DIAGNOSIS — K222 Esophageal obstruction: Secondary | ICD-10-CM

## 2020-02-13 DIAGNOSIS — N1832 Chronic kidney disease, stage 3b: Secondary | ICD-10-CM

## 2020-02-13 NOTE — Progress Notes (Signed)
MEDICARE ANNUAL WELLNESS VISIT AND FOLLOW UP  Assessment:    Medicare annual wellness visit, subsequent Yearly   Essential hypertension Continue current medications: Losartan 100mg  & bisoprolol 5-6.25mg  Monitor blood pressure at home; call if consistently over 130/80 Continue DASH diet.   Reminder to go to the ER if any CP, SOB, nausea, dizziness, severe HA, changes vision/speech, left arm numbness and tingling and jaw pain. - CBC with Differential/Platelet - CMP/GFR - TSH  Hyperlipidemia associated with type 2 diabetes mellitus (HCC) Myalgias due to statins Continue medications:Fenofibrate 145mg  & fish oil 1200mg  daily Discussed dietary and exercise modifications Low fat diet  Hyperparathyroidism, primary (HCC) Monitor  Type 2 diabetes mellitus with stage 3a chronic kidney disease, without long-term current use of insulin (HCC) Controlled by diet & exercise Continue to monitor  Vitamin D deficiency Continue supplementation to maintain goal of 70-100 Taking Vitamin D 5,000 IU daily Defer vitamin D level  Vitamin B12 deficiency Continue supplementation  Stricture and stenosis of esophagus Doing well Continue to monitor  Kidney stones Continue to monitor  Stage 3b chronic kidney disease (HCC) Increase fluids  Avoid NSAIDS Blood pressure control Monitor sugars  Will continue to monitor  Gastroesophageal reflux disease, unspecified whether esophagitis present Continue pantoprazole 40mg  daily Doing well at this time Diet discussed Monitor for triggers Avoid food with high acid content Avoid excessive cafeine Increase water intake  Other secondary osteoarthritis of left hip Uses meloxicam 15mg  PRN Also has Methocarbamol Follows with Dr Mayer Camel   Morbid obesity, unspecified obesity type ([place]) Long discussion about weight loss, diet, and exercise Discussed final goal weight  Patient didn't tolerate topamax, has also tried phentermine; continue close follow  up.   Medication management - Magnesium  Vitamin D deficiency - VITAMIN D 25 Hydroxy (Vit-D Deficiency, Fractures)  Vitamin B12 deficiency  Continue supplementation    DEFER ALL LABS TODAY AS JUST HAD IN OCTOBER  Over 30 minutes of face to face interview, exam, counseling, chart review, and critical decision making was performed  Future Appointments  Date Time Provider [place]  03/04/2020  2:00 PM Unk Pinto, MD [facility] None  02/12/2021  2:30 PM Garnet Sierras, NP [facility] None     Plan:   During the course of the visit the patient was educated and counseled about appropriate screening and preventive services including:    Pneumococcal vaccine   Influenza vaccine  Prevnar 13  Td vaccine  Screening electrocardiogram  Colorectal cancer screening  Diabetes screening  Glaucoma screening  Nutrition counseling    Subjective:  [name] [name] is a 79 y.o. male who presents for Medicare Annual Wellness Visit.  He has had multiple surgeries on L hip for failed arthroplasty in the past 1-2 years, now working with PT twice a week but still with antalgic gait and walking with cane. Ortho unfortunately suspects this will be permanent. He reports pain is minimal and manageable but cannot walk miles daily as he used to. He followed with Dr Mayer Camel. He had a fall and went to [facility] s/p back surgery and had a second back surgery. He is currently doing physical therapy.  he is prescribed phentermine/topamax for weight loss at last visit. He report he experienced severe sedation and stopped both medications after 2 days. While on the medication they have lost 3 lbs since last visit. They deny palpitations, anxiety, trouble sleeping, elevated BP.   BMI is Body mass index is 37.1 kg/m., he is working on diet and exercise. He reports quantity  is very limited, but admits to high sugar/starch intake - 1/2 bagel with jam, glass of juice, etc.  Wt Readings from  Last 3 Encounters:  02/13/20 266 lb (120.7 kg)  12/05/19 270 lb 14.4 oz (122.9 kg)  12/01/19 270 lb 14.4 oz (122.9 kg)   His blood pressure has been controlled at home, today their BP is BP: 126/78 He does not workout. He denies chest pain, shortness of breath, dizziness.   Has chronic GERD, on PPI, has had esophagus stretched 3 times.  Had cystoscopy for stone/stent placement in Oct 2018 by Dr. Diona Fanti  He is on cholesterol medication and has some myalgias with statins, taking fenofibrate. His cholesterol is at goal. The cholesterol last visit was:   Lab Results  Component Value Date   CHOL 161 02/06/2019   HDL 46 02/06/2019   LDLCALC 95 02/06/2019   TRIG 104 02/06/2019   CHOLHDL 3.5 02/06/2019   He has been working on diet and exercise for prediabetes/T2DM, and denies paresthesia of the feet, polydipsia, polyuria and visual disturbances. Last A1C in the office was:  Lab Results  Component Value Date   HGBA1C 6.5 (H) 12/05/2019   Last GFR  Lab Results  Component Value Date   GFRNONAA 48 (L) 12/07/2019   Patient is on Vitamin D supplement.   Lab Results  Component Value Date   VD25OH 89.91 09/05/2019        Medication Review: Current Outpatient Medications on File Prior to Visit  Medication Sig Dispense Refill  . aspirin EC 81 MG tablet Restart on 12/11/2019 1 tablet 0  . b complex vitamins tablet Take 1 tablet by mouth daily.    . bisoprolol-hydrochlorothiazide (ZIAC) 5-6.25 MG tablet Take 1 tablet by mouth in the morning. Take 1 tablet Daily for BP 90 tablet 1  . Cholecalciferol (VITAMIN D PO) Take 5,000 Units by mouth daily.    . Cholecalciferol (VITAMIN D) 125 MCG (5000 UT) CAPS Take 5,000 Units by mouth daily.    . diclofenac Sodium (VOLTAREN) 1 % GEL Restart 10/11 100 g 3  . fenofibrate (TRICOR) 145 MG tablet TAKE 1 TABLET DAILY FOR TRIGLYCERIDES (BLOOD FATS) 90 tablet 3  . finasteride (PROSCAR) 5 MG tablet Take 5 mg by mouth daily.    . Flaxseed, Linseed,  (FLAX SEEDS PO) Take 1,000 mg by mouth daily.     Marland Kitchen gabapentin (NEURONTIN) 300 MG capsule Take 300 mg by mouth 3 (three) times daily.    Marland Kitchen losartan (COZAAR) 100 MG tablet TAKE 1 TABLET BY MOUTH DAILY FOR BLOOD PRESSURE (Patient taking differently: Take 100 mg by mouth every evening.) 90 tablet 3  . Magnesium Oxide 250 MG TABS Take 250 mg by mouth 2 (two) times daily.     . meloxicam (MOBIC) 15 MG tablet Restart on 10.11 90 tablet 1  . methocarbamol (ROBAXIN) 500 MG tablet Take 1 tablet (500 mg total) by mouth 3 (three) times daily. 40 tablet 3  . Omega-3 Fatty Acids (FISH OIL) 1200 MG CAPS Restart on 10.11 1 capsule 0  . pantoprazole (PROTONIX) 40 MG tablet TAKE 1 TABLET BY MOUTH TWICE A DAY. (Patient taking differently: Take 40 mg by mouth 2 (two) times daily.) 180 tablet 1  . sildenafil (VIAGRA) 100 MG tablet Take 100 mg by mouth daily as needed for erectile dysfunction.    . tamsulosin (FLOMAX) 0.4 MG CAPS capsule Take 0.4 mg by mouth daily after breakfast.   3  . vitamin B-12 (CYANOCOBALAMIN) 1000 MCG tablet  Take 1 tablet (1,000 mcg total) by mouth daily. 30 tablet 0   No current facility-administered medications on file prior to visit.    Current Problems (verified) Patient Active Problem List   Diagnosis Date Noted  . Fixation hardware in spine 12/05/2019  . Chronic congestive heart failure, unspecified heart failure type ([place]) 09/11/2019  . Hyperlipidemia associated with type 2 diabetes mellitus ([place]) 09/11/2019  . Spinal stenosis 09/05/2019  . Abnormal glucose 08/11/2019  . Herniated nucleus pulposus, L2-3 left 11/09/2017  . Normocytic anemia 05/02/2017  . Vitamin B12 deficiency 05/02/2017  . OA (osteoarthritis) of hip 06/24/2016  . CKD (chronic kidney disease) stage 3, GFR 30-59 ml/min (HCC) 03/25/2015  . Snoring 03/25/2015  . Encounter for Medicare annual wellness exam 11/27/2014  . Hyperparathyroidism, primary ([place]) 07/09/2014  . Vitamin D deficiency 06/13/2013  .  Medication management 06/13/2013  . Hyperlipidemia   . Hypertension   . GERD (gastroesophageal reflux disease)   . Kidney stones   . Morbidly obese ([place])   . Stricture and stenosis of esophagus 10/18/2008    Screening Tests Immunization History  Administered Date(s) Administered  . Influenza Split 12/27/2012  . Influenza, High Dose Seasonal PF 01/31/2014, 11/27/2014, 01/12/2017, 01/13/2018  . Influenza-Unspecified 12/16/2016, 01/12/2017  . PFIZER SARS-COV-2 Vaccination 01/31/2020  . Pneumococcal Conjugate-13 03/25/2015  . Pneumococcal Polysaccharide-23 03/02/2008  . Td 03/02/2005, 09/24/2015  . Zoster 03/02/2006   Preventative care: Last colonoscopy: 01/2017 EGD 2012 MGM 2013  Prior vaccinations: TD or Tdap: 2017 Influenza: 2019  Pneumococcal: 2010 Prevnar13: 2017 Shingles/Zostavax: 2008  Names of Other Physician/Practitioners you currently use: 1. [facility] here for primary care 2. Dr. Katy Fitch, eye doctor, last visit 2019, has upcoming cataract  3. Dr. Altamese [place], dentist, last visit 2019  Patient Care Team: Unk Pinto, MD as PCP - General (Internal Medicine) Newt Minion, MD as Consulting Physician (Orthopedic Surgery) Inda Castle, MD (Inactive) as Consulting Physician (Gastroenterology) Franchot Gallo, MD as Consulting Physician (Urology) Deloria Lair, NP as [place] [facility]  Allergies Allergies  Allergen Reactions  . Iodinated Diagnostic Agents Shortness Of Breath  . Iohexol Shortness Of Breath    SURGICAL HISTORY He  has a past surgical history that includes tumor ear (Left, age 26); Cystoscopy w/ ureteroscopy w/ lithotripsy (Left, 05/04/2005); Colonoscopy (02/17/2005); Nephrolithotomy (Left, 02/01/2014); Parathyroidectomy (Left, 07/10/2014); CYSTO/  LEFT RETROGRADE PYELOGRAM/ STENT PLACEMENT (01/26/2005); Esophagogastroduodenoscopy (egd) with esophageal dilation (x4  last one  07-21-2010); Inguinal hernia repair (Left, yrs ago); Cystoscopy/retrograde/ureteroscopy/stone extraction with basket (Left, 12/30/2015); Cystoscopy with stent placement (Left, 12/30/2015); Holmium laser application (Left, 19/50/9326); Cystoscopy/ureteroscopy/holmium laser/stent placement (Left, 03/09/2016); Total hip arthroplasty (Left, 06/24/2016); Total hip revision (Left, 11/18/2016); ORIF hip fracture (Left, 04/16/2017); Lumbar laminectomy/decompression microdiscectomy (Left, 11/09/2017); Back surgery (04/2018); and Posterior lumbar fusion (09/07/2019). FAMILY HISTORY His family history includes Alzheimer's disease in his mother; Bladder Cancer in his sister; Heart disease in his father and mother. SOCIAL HISTORY He  reports that he quit smoking about 45 years ago. He quit after 8.00 years of use. He has never used smokeless tobacco. He reports previous alcohol use of about 1.0 - 2.0 standard drink of alcohol per week. He reports that he does not use drugs.  MEDICARE WELLNESS OBJECTIVES: Physical activity: Current Exercise Habits: The patient does not participate in regular exercise at present Cardiac risk factors: Cardiac Risk Factors include: advanced age (>46men, >55 women);dyslipidemia;diabetes mellitus Depression/mood screen:   Depression screen [facility] 2/9 02/13/2020  Decreased Interest 0  Down,  Depressed, Hopeless 0  PHQ - 2 Score 0  Some recent data might be hidden    ADLs:  In your present state of health, do you have any difficulty performing the following activities: 02/13/2020 12/05/2019  Hearing? N N  Vision? N N  Difficulty concentrating or making decisions? N N  Walking or climbing stairs? N Y  Dressing or bathing? N Y  Doing errands, shopping? N Y  Conservation officer, nature and eating ? N -  Using the Toilet? N -  In the past six months, have you accidently leaked urine? N -  Do you have problems with loss of bowel control? N -  Managing your Medications? N -  Managing your Finances? N -   Housekeeping or managing your Housekeeping? N -  Some recent data might be hidden     Cognitive Testing  Alert? Yes  Normal Appearance?Yes  Oriented to person? Yes  Place? Yes   Time? Yes  Recall of three objects?  Yes  Can perform simple calculations? Yes  Displays appropriate judgment?Yes  Can read the correct time from a watch face?Yes  EOL planning: Does Patient Have a Medical Advance Directive?: Yes Type of Advance Directive: [place] will [place] in Chart?: No - copy requested   Objective:   Today's Vitals   02/13/20 1455  BP: 126/78  Pulse: 71  Temp: 97.7 F (36.5 C)  SpO2: 98%  Weight: 266 lb (120.7 kg)  Height: 5\' 11"  (1.803 m)  PainSc: 0-No pain   Body mass index is 37.1 kg/m.  General appearance: alert, no distress, WD/WN, male HEENT: normocephalic, sclerae anicteric, TMs pearly, nares patent, no discharge or erythema, pharynx normal Oral cavity: MMM, no lesions, crowded mouth Neck: supple, large neck circumference, no lymphadenopathy, no thyromegaly, no masses Heart: RRR, normal S1, S2, no murmurs Lungs: CTA bilaterally, no wheezes, rhonchi, or rales Abdomen: +bs, soft, obese, non tender, non distended, no masses, no hepatomegaly, no splenomegaly Musculoskeletal: nontender, no swelling, no obvious deformity, antalgic gait with cane Extremities: no edema, no cyanosis, no clubbing Pulses: 2+ symmetric, upper and lower extremities, normal cap refill Neurological: alert, oriented x 3, CN2-12 intact, strength normal upper extremities, weakness in bilateral lower extremities, sensation diminished in L thigh, no cerebellar signs, gait antalgic Psychiatric: normal affect, behavior normal, pleasant   Medicare Attestation I have personally reviewed: The patient's medical and social history Their use of alcohol, tobacco or illicit drugs Their current medications and supplements The patient's functional  ability including ADLs,fall risks, home safety risks, cognitive, and hearing and visual impairment Diet and physical activities Evidence for depression or mood disorders  The patient's weight, height, BMI, and visual acuity have been recorded in the chart.  I have made referrals, counseling, and provided education to the patient based on review of the above and I have provided the patient with a written personalized care plan for preventive services.     Garnet Sierras, NP   02/13/2020

## 2020-02-13 NOTE — Patient Instructions (Addendum)
  Check at home to see if you are taking Meloxicam (Mobic) and Methocarbamol (Robaxin).  Let us know about your medication list.   Follow with Dr Gershon Crane regarding cataracts.    [name] [name] , Thank you for taking time to come for your Medicare Wellness Visit. I appreciate your ongoing commitment to your health goals. Please review the following plan we discussed and let me know if I can assist you in the future.   These are the goals we discussed: Goals    . HEMOGLOBIN A1C < 6.0    . Weight (lb) < 240 lb (108.9 kg)       This is a list of the screening recommended for you and due dates:  Health Maintenance  Topic Date Due  .  Hepatitis C: One time screening is recommended by Center for Disease Control  (CDC) for  adults born from [birth year] through 1963.   Never done  . Eye exam for diabetics  02/11/2019  . Flu Shot  10/01/2019  . Colon Cancer Screening  02/13/2020  . Complete foot exam   02/05/2020  . COVID-19 Vaccine (2 - Pfizer 3-dose booster series) 02/21/2020  . Hemoglobin A1C  06/04/2020  . Tetanus Vaccine  09/23/2025  . Pneumonia vaccines  Completed

## 2020-02-14 ENCOUNTER — Other Ambulatory Visit: Payer: Self-pay | Admitting: Adult Health Nurse Practitioner

## 2020-02-14 DIAGNOSIS — M169 Osteoarthritis of hip, unspecified: Secondary | ICD-10-CM

## 2020-02-14 MED ORDER — MELOXICAM 15 MG PO TABS: ORAL_TABLET | ORAL | 1 refills | Status: DC

## 2020-03-02 HISTORY — PX: CATARACT EXTRACTION: SUR2

## 2020-03-04 ENCOUNTER — Encounter: Payer: Medicare Other | Admitting: Internal Medicine

## 2020-03-27 DIAGNOSIS — M5126 Other intervertebral disc displacement, lumbar region: Secondary | ICD-10-CM | POA: Diagnosis not present

## 2020-03-27 DIAGNOSIS — R531 Weakness: Secondary | ICD-10-CM | POA: Diagnosis not present

## 2020-03-27 DIAGNOSIS — Z4789 Encounter for other orthopedic aftercare: Secondary | ICD-10-CM | POA: Diagnosis not present

## 2020-03-27 DIAGNOSIS — Z96642 Presence of left artificial hip joint: Secondary | ICD-10-CM | POA: Diagnosis not present

## 2020-03-29 DIAGNOSIS — M5126 Other intervertebral disc displacement, lumbar region: Secondary | ICD-10-CM | POA: Diagnosis not present

## 2020-03-29 DIAGNOSIS — R531 Weakness: Secondary | ICD-10-CM | POA: Diagnosis not present

## 2020-03-29 DIAGNOSIS — Z4789 Encounter for other orthopedic aftercare: Secondary | ICD-10-CM | POA: Diagnosis not present

## 2020-03-29 DIAGNOSIS — Z96642 Presence of left artificial hip joint: Secondary | ICD-10-CM | POA: Diagnosis not present

## 2020-04-02 DIAGNOSIS — M25552 Pain in left hip: Secondary | ICD-10-CM | POA: Diagnosis not present

## 2020-04-05 DIAGNOSIS — M5126 Other intervertebral disc displacement, lumbar region: Secondary | ICD-10-CM | POA: Diagnosis not present

## 2020-04-05 DIAGNOSIS — R531 Weakness: Secondary | ICD-10-CM | POA: Diagnosis not present

## 2020-04-05 DIAGNOSIS — Z4789 Encounter for other orthopedic aftercare: Secondary | ICD-10-CM | POA: Diagnosis not present

## 2020-04-05 DIAGNOSIS — Z96642 Presence of left artificial hip joint: Secondary | ICD-10-CM | POA: Diagnosis not present

## 2020-04-08 DIAGNOSIS — Z4789 Encounter for other orthopedic aftercare: Secondary | ICD-10-CM | POA: Diagnosis not present

## 2020-04-08 DIAGNOSIS — Z96642 Presence of left artificial hip joint: Secondary | ICD-10-CM | POA: Diagnosis not present

## 2020-04-08 DIAGNOSIS — M5126 Other intervertebral disc displacement, lumbar region: Secondary | ICD-10-CM | POA: Diagnosis not present

## 2020-04-08 DIAGNOSIS — R531 Weakness: Secondary | ICD-10-CM | POA: Diagnosis not present

## 2020-04-10 DIAGNOSIS — Z96642 Presence of left artificial hip joint: Secondary | ICD-10-CM | POA: Diagnosis not present

## 2020-04-10 DIAGNOSIS — M5126 Other intervertebral disc displacement, lumbar region: Secondary | ICD-10-CM | POA: Diagnosis not present

## 2020-04-10 DIAGNOSIS — R531 Weakness: Secondary | ICD-10-CM | POA: Diagnosis not present

## 2020-04-10 DIAGNOSIS — M48062 Spinal stenosis, lumbar region with neurogenic claudication: Secondary | ICD-10-CM | POA: Diagnosis not present

## 2020-04-10 DIAGNOSIS — Z4789 Encounter for other orthopedic aftercare: Secondary | ICD-10-CM | POA: Diagnosis not present

## 2020-04-15 DIAGNOSIS — Z4789 Encounter for other orthopedic aftercare: Secondary | ICD-10-CM | POA: Diagnosis not present

## 2020-04-15 DIAGNOSIS — M5126 Other intervertebral disc displacement, lumbar region: Secondary | ICD-10-CM | POA: Diagnosis not present

## 2020-04-15 DIAGNOSIS — R531 Weakness: Secondary | ICD-10-CM | POA: Diagnosis not present

## 2020-04-15 DIAGNOSIS — Z96642 Presence of left artificial hip joint: Secondary | ICD-10-CM | POA: Diagnosis not present

## 2020-04-16 ENCOUNTER — Other Ambulatory Visit: Payer: Self-pay | Admitting: Internal Medicine

## 2020-04-16 DIAGNOSIS — M169 Osteoarthritis of hip, unspecified: Secondary | ICD-10-CM

## 2020-04-17 DIAGNOSIS — Z96642 Presence of left artificial hip joint: Secondary | ICD-10-CM | POA: Diagnosis not present

## 2020-04-17 DIAGNOSIS — R531 Weakness: Secondary | ICD-10-CM | POA: Diagnosis not present

## 2020-04-17 DIAGNOSIS — M5126 Other intervertebral disc displacement, lumbar region: Secondary | ICD-10-CM | POA: Diagnosis not present

## 2020-04-17 DIAGNOSIS — Z4789 Encounter for other orthopedic aftercare: Secondary | ICD-10-CM | POA: Diagnosis not present

## 2020-04-18 ENCOUNTER — Encounter: Payer: Self-pay | Admitting: Gastroenterology

## 2020-04-22 DIAGNOSIS — Z4789 Encounter for other orthopedic aftercare: Secondary | ICD-10-CM | POA: Diagnosis not present

## 2020-04-22 DIAGNOSIS — Z96642 Presence of left artificial hip joint: Secondary | ICD-10-CM | POA: Diagnosis not present

## 2020-04-22 DIAGNOSIS — R531 Weakness: Secondary | ICD-10-CM | POA: Diagnosis not present

## 2020-04-22 DIAGNOSIS — M5126 Other intervertebral disc displacement, lumbar region: Secondary | ICD-10-CM | POA: Diagnosis not present

## 2020-04-23 ENCOUNTER — Encounter: Payer: Self-pay | Admitting: Internal Medicine

## 2020-04-23 DIAGNOSIS — I700 Atherosclerosis of aorta: Secondary | ICD-10-CM | POA: Insufficient documentation

## 2020-04-23 NOTE — Patient Instructions (Signed)

## 2020-04-23 NOTE — Progress Notes (Signed)
Annual  Screening/Preventative Visit  & Comprehensive Evaluation & Examination      This very nice 80 y.o.  MWM  presents for a Screening /Preventative Visit & comprehensive evaluation and management of multiple medical co-morbidities.  Patient has been followed for HTN, HLD, T2_NIDDM  and Vitamin D Deficiency. Patient does c/o ongoing hacking tickle cough which he attributes to allergies.      HTN predates circa 2002. Patient's BP has been controlled at home.  Today's BP is at goal - 130/84.  Lumbar CT scan found Aortic Atherosclerosis.  In 11/2019.  Patient denies any cardiac symptoms as chest pain, palpitations, shortness of breath, dizziness or ankle swelling.      Patient's hyperlipidemia is controlled with diet and medications. Patient denies myalgias or other medication SE's. Last lipids were at goal:  Lab Results  Component Value Date   CHOL 161 02/06/2019   HDL 46 02/06/2019   LDLCALC 95 02/06/2019   TRIG 104 02/06/2019   CHOLHDL 3.5 02/06/2019        Patient has hx/o  Moderate Obesity with BMI 36 and PreDiabetes (2010) transitioning to T2_DM (A1c 6.8% /Feb 2019)  w/CKD3a (GFR 48) attempting diet control.   Patient denies reactive hypoglycemic symptoms, visual blurring, diabetic polys or paresthesias. Last A1c was not at goal:   Lab Results  Component Value Date   HGBA1C 6.5 (H) 12/05/2019         Finally, patient has history of Vitamin D Deficiency ("36"/2009) and last vitamin D was at goal:   Lab Results  Component Value Date   VD25OH 89.91 09/05/2019    Current Outpatient Medications on File Prior to Visit  Medication Sig  . aspirin EC 81 MG tablet Restart on 12/11/2019  . b complex vitamins tablet Take 1 tablet daily.  . bisoprolol-hctz 5-6.25 MG tablet Take 1 tablet Daily for BP  . VITAMIN D 5,000 Units Take   daily.  . diclofenac  1 % GEL As needed  . fenofibrate  145 MG tablet TAKE 1 TABLET DAILY   . finasteride  5 MG tablet Take  daily.  Marland Kitchen FLAX SEEDS  1,000 mg  Take  daily.   Marland Kitchen gabapentin  300 MG capsule Take 300 mg 3 times daily.  Marland Kitchen losartan100 MG tablet TAKE 1 TABLET  DAILY   . Magnesium Oxide 250 MG TABS Take  2 ( times daily.   . meloxicam 15 MG tablet TAKE 1/2 TO 1 TABLET DAILY   . methocarbamol 500 MG Take 1 tablet 3 times daily.  . Omega-3 FISH OIL 1200 MG CAPS Take daily  . pantoprazole 40 MG tablet TAKE 1 TABLET  TWICE A DAY.   . sildenafil 100 MG tablet Take  daily as needed for erectile dysfunction.  . tamsulosin 0.4 MG CAPS capsule Take daily after breakfast.   . vitamin B-12 1000 MCG tablet Take 1 tablet  daily.    Allergies  Allergen Reactions  . Iodinated Diagnostic Agents Shortness Of Breath  . Iohexol Shortness Of Breath    Past Medical History:  Diagnosis Date  . Arthritis    in left hip  . ED (erectile dysfunction)   . Enlarged prostate with lower urinary tract symptoms (LUTS)   . GERD (gastroesophageal reflux disease)   . History of chronic gastritis   . History of colon polyps    hyperplastic 2006  . History of esophageal stricture    S/P  DILATATION 2009; 2010; 2010 2012  . History of  kidney stones    hx. multiple kidney stones  . History of kidney stones   . History of primary hyperparathyroidism    s/p  left superior parathyroidectomy 07-10-2014  . Hyperlipidemia   . Hypertension   . Ileus ([place]) 05/02/2017  . Left ureteral stone   . Pre-diabetes   . Sepsis secondary to UTI [facility]) 05/02/2017   Health Maintenance  Topic Date Due  . Hepatitis C Screening  Never done  . OPHTHALMOLOGY EXAM  02/11/2019  . INFLUENZA VACCINE  10/01/2019  . FOOT EXAM  02/05/2020  . COLONOSCOPY (Pts 45-1yrs Insurance coverage will need to be confirmed)  02/13/2020  . COVID-19 Vaccine (2 - Pfizer 3-dose series) 02/21/2020  . HEMOGLOBIN A1C  06/04/2020  . TETANUS/TDAP  09/23/2025  . PNA vac Low Risk Adult  Completed   Immunization History  Administered Date(s) Administered  . Influenza Split 12/27/2012  .  Influenza, High Dose Seasonal PF 01/31/2014, 11/27/2014, 01/12/2017, 01/13/2018  . Influenza-Unspecified 12/16/2016, 01/12/2017  . PFIZER(Purple Top)SARS-COV-2 Vaccination 01/31/2020  . Pneumococcal Conjugate-13 03/25/2015  . Pneumococcal Polysaccharide-23 03/02/2008  . Td 03/02/2005, 09/24/2015  . Zoster 03/02/2006   02/12/2017 - Last Colon - Dr Silverio Decamp - Recc 3 year f/u due Jan 2022  Past Surgical History:  Procedure Laterality Date  . BACK SURGERY  04/2018   fusion L2 and L3  . COLONOSCOPY  02/17/2005  . CYSTO/  LEFT RETROGRADE PYELOGRAM/ STENT PLACEMENT  01/26/2005  . CYSTOSCOPY W/ URETEROSCOPY W/ LITHOTRIPSY Left 05/04/2005  . CYSTOSCOPY WITH STENT PLACEMENT Left 12/30/2015   Procedure: CYSTOSCOPY WITH STENT PLACEMENT;  Surgeon: Franchot Gallo, MD;  [facility];  Service: Urology;  Laterality: Left;  . CYSTOSCOPY/RETROGRADE/URETEROSCOPY/STONE EXTRACTION WITH BASKET Left 12/30/2015   Procedure: CYSTOSCOPY/RETROGRADE/URETEROSCOPY/STONE EXTRACTION WITH BASKET;  Surgeon: Franchot Gallo, MD;  Location: [facility];  Service: Urology;  Laterality: Left;  . CYSTOSCOPY/URETEROSCOPY/HOLMIUM LASER/STENT PLACEMENT Left 03/09/2016   Procedure: CYSTOSCOPY/RETROGRADE PYELOGRAM/URETEROSCOPY/BASKET STONE EXTRACTION/STENT PLACEMENT;  Surgeon: Franchot Gallo, MD;  Location: [facility];  Service: Urology;  Laterality: Left;  . ESOPHAGOGASTRODUODENOSCOPY (EGD) WITH ESOPHAGEAL DILATION  x4  last one 07-21-2010  . HOLMIUM LASER APPLICATION Left 71/69/6789   Procedure: HOLMIUM LASER APPLICATION;  Surgeon: Franchot Gallo, MD;  Location[facility];  Service: Urology;  Laterality: Left;  . INGUINAL HERNIA REPAIR Left yrs ago  . LUMBAR LAMINECTOMY/DECOMPRESSION MICRODISCECTOMY Left 11/09/2017   Procedure: Left Lumbar Two-Three Microdiscectomy;  Surgeon: Kristeen Miss, MD;  Location: [place];  Service: Neurosurgery;  Laterality: Left;  Left Lumbar  Two-Three Microdiscectomy  . NEPHROLITHOTOMY Left 02/01/2014   Procedure: NEPHROLITHOTOMY PERCUTANEOUS;  Surgeon: Jorja Loa, MD;  Location: [facility];  Service: Urology;  Laterality: Left;  . ORIF HIP FRACTURE Left 04/16/2017   Procedure: OPEN REDUCTION INTERNAL FIXATION HIP GREATER TROCHANTER;  Surgeon: Frederik Pear, MD;  Location: [place];  Service: Orthopedics;  Laterality: Left;  . PARATHYROIDECTOMY Left 07/10/2014   Procedure: LEFT SUPERIOR PARATHYROIDECTOMY;  Surgeon: Armandina Gemma, MD;  Location: [place];  Service: General;  Laterality: Left;  . POSTERIOR LUMBAR FUSION  09/07/2019  . TOTAL HIP ARTHROPLASTY Left 06/24/2016   Procedure: LEFT TOTAL HIP ARTHROPLASTY ANTERIOR APPROACH;  Surgeon: Gaynelle Arabian, MD;  Location: [facility];  Service: Orthopedics;  Laterality: Left;  . TOTAL HIP REVISION Left 11/18/2016   Procedure: Left femoral revision - posterior approach;  Surgeon: Gaynelle Arabian, MD;  Location: [facility];  Service: Orthopedics;  Laterality: Left;  . tumor ear Left age 40  topical growth behind left ear.   Family History  Problem Relation Age of Onset  . Heart disease Mother   . Alzheimer's disease Mother   . Heart disease Father   . Bladder Cancer Sister   . Colon cancer Neg Hx   . Esophageal cancer Neg Hx   . Stomach cancer Neg Hx   . Rectal cancer Neg Hx    Social History   Socioeconomic History  . Marital status: Married    Spouse name: Vaughan Basta  . Number of children: 2  Occupational History  . Occupation: real estate business  Tobacco Use  . Smoking status: Former Smoker    Years: 8.00    Quit date: 03/02/1974    Years since quitting: 46.1  . Smokeless tobacco: Never Used  Vaping Use  . Vaping Use: Never used  Substance and Sexual Activity  . Alcohol use: Not Currently    Alcohol/week: 1.0 - 2.0 standard drink    Types: 1 - 2 Glasses of wine per week  . Drug use: No  . Sexual activity: Yes   ROS Constitutional: Denies fever, chills, weight loss/gain,  headaches, insomnia,  night sweats or change in appetite. Does c/o fatigue. Eyes: Denies redness, blurred vision, diplopia, discharge, itchy or watery eyes.  ENT: Denies discharge, congestion, post nasal drip, epistaxis, sore throat, earache, hearing loss, dental pain, Tinnitus, Vertigo, Sinus pain or snoring.  Cardio: Denies chest pain, palpitations, irregular heartbeat, syncope, dyspnea, diaphoresis, orthopnea, PND, claudication or edema Respiratory: denies cough, dyspnea, DOE, pleurisy, hoarseness, laryngitis or wheezing.  Gastrointestinal: Denies dysphagia, heartburn, reflux, water brash, pain, cramps, nausea, vomiting, bloating, diarrhea, constipation, hematemesis, melena, hematochezia, jaundice or hemorrhoids Genitourinary: Denies dysuria, frequency, urgency, nocturia, hesitancy, discharge, hematuria or flank pain Musculoskeletal: Denies arthralgia, myalgia, stiffness, Jt. Swelling, pain, limp or strain/sprain. Denies Falls. Skin: Denies puritis, rash, hives, warts, acne, eczema or change in skin lesion Neuro: No weakness, tremor, incoordination, spasms, paresthesia or pain Psychiatric: Denies confusion, memory loss or sensory loss. Denies Depression. Endocrine: Denies change in weight, skin, hair change, nocturia, and paresthesia, diabetic polys, visual blurring or hyper / hypo glycemic episodes.  Heme/Lymph: No excessive bleeding, bruising or enlarged lymph nodes.  Physical Exam  BP 130/84   Pulse (!) 53   Temp (!) 97 F (36.1 C)   Resp 16   Ht 5\' 11"  (1.803 m)   Wt 261 lb 12.8 oz (118.8 kg)   SpO2 97%   BMI 36.51 kg/m   General Appearance: Well nourished and well groomed and in no apparent distress.  Eyes: PERRLA, EOMs, conjunctiva no swelling or erythema, normal fundi and vessels. Sinuses: No frontal/maxillary tenderness ENT/Mouth: EACs patent / TMs  nl. Nares clear without erythema, swelling, mucoid exudates. Oral hygiene is good. No erythema, swelling, or exudate. Tongue  normal, non-obstructing. Tonsils not swollen or erythematous. Hearing normal.  Neck: Supple, thyroid not palpable. No bruits, nodes or JVD. Respiratory: Respiratory effort normal.  BS equal and clear bilateral without rales, rhonci, wheezing or stridor. Cardio: Heart sounds are normal with regular rate and rhythm and no murmurs, rubs or gallops. Peripheral pulses are normal and equal bilaterally without edema. No aortic or femoral bruits. Chest: symmetric with normal excursions and percussion.  Abdomen: Soft, obese with Nl bowel sounds. Nontender, no guarding, rebound, hernias, masses, or organomegaly.  Lymphatics: Non tender without lymphadenopathy.  Musculoskeletal: Full ROM all peripheral extremities, joint stability, 5/5 strength, and normal gait. Skin: Warm and dry without rashes, lesions, cyanosis, clubbing or  ecchymosis.  Neuro: Cranial nerves intact, reflexes equal bilaterally. Normal muscle tone, no cerebellar symptoms. Sensation intact to touch, vibratory and Monofilament to the toes bilaterally. Pysch: Alert and oriented X 3 with normal affect, insight and judgment appropriate.   Assessment and Plan  1. Annual Preventative/Screening Exam    2. Essential hypertension  - EKG 12-Lead - [place], RETROPERITNL ABD,  LTD - Urinalysis, Routine w reflex microscopic - Microalbumin / creatinine urine ratio - CBC with Differential/Platelet - COMPLETE METABOLIC PANEL WITH GFR - Magnesium - TSH  3. Hyperlipidemia associated with type 2 diabetes mellitus ([place])  - EKG 12-Lead - [place], RETROPERITNL ABD,  LTD - TSH - Lipid panel  4. Type 2 diabetes mellitus with stage 3a chronic  kidney disease, without long-term current use of insulin (HCC)  - EKG 12-Lead - [place], RETROPERITNL ABD,  LTD - Urinalysis, Routine w reflex microscopic - Microalbumin / creatinine urine ratio - HM DIABETES FOOT EXAM - LOW EXTREMITY NEUR EXAM DOCUM - PTH, intact and calcium - Hemoglobin A1c - Insulin, random -  Lipid panel  5. Vitamin D deficiency  - VITAMIN D 25 Hydroxy   6. Aortic atherosclerosis ([place]) by Lumbar CT scan on 11/22/2019  - EKG 12-Lead - [place], RETROPERITNL ABD,  LTD - Lipid panel  7. BPH with obstruction/lower urinary tract symptoms  - PSA  8. Screening for colorectal cancer   9. Prostate cancer screening  - PSA  10. Screening for ischemic heart disease  - EKG 12-Lead  11. FHx: heart disease  - EKG 12-Lead - [place], RETROPERITNL ABD,  LTD  12. Former smoker  - EKG 12-Lead - [place], RETROPERITNL ABD,  LTD  13. Screening for AAA (aortic abdominal aneurysm)  - [place], RETROPERITNL ABD,  LTD  14. Medication management  - Microalbumin / creatinine urine ratio - CBC with Differential/Platelet - COMPLETE METABOLIC PANEL WITH GFR - Magnesium - TSH - Hemoglobin A1c - Insulin, random - VITAMIN D 25 Hydroxy - Lipid panel           Patient was counseled in prudent diet, weight control to achieve/maintain BMI less than 25, BP monitoring, regular exercise and medications as discussed.  Discussed med effects and SE's. Routine screening labs and tests as requested with regular follow-up as recommended. Over 40 minutes of exam, counseling, chart review and high complex critical decision making was performed   Kirtland Bouchard, MD

## 2020-04-24 ENCOUNTER — Ambulatory Visit (INDEPENDENT_AMBULATORY_CARE_PROVIDER_SITE_OTHER): Payer: Medicare Other | Admitting: Internal Medicine

## 2020-04-24 ENCOUNTER — Other Ambulatory Visit: Payer: Self-pay

## 2020-04-24 VITALS — BP 130/84 | HR 53 | Temp 97.0°F | Resp 16 | Ht 71.0 in | Wt 261.8 lb

## 2020-04-24 DIAGNOSIS — N1831 Chronic kidney disease, stage 3a: Secondary | ICD-10-CM | POA: Diagnosis not present

## 2020-04-24 DIAGNOSIS — I10 Essential (primary) hypertension: Secondary | ICD-10-CM | POA: Diagnosis not present

## 2020-04-24 DIAGNOSIS — Z125 Encounter for screening for malignant neoplasm of prostate: Secondary | ICD-10-CM

## 2020-04-24 DIAGNOSIS — E1122 Type 2 diabetes mellitus with diabetic chronic kidney disease: Secondary | ICD-10-CM | POA: Diagnosis not present

## 2020-04-24 DIAGNOSIS — I700 Atherosclerosis of aorta: Secondary | ICD-10-CM

## 2020-04-24 DIAGNOSIS — Z136 Encounter for screening for cardiovascular disorders: Secondary | ICD-10-CM | POA: Diagnosis not present

## 2020-04-24 DIAGNOSIS — E1169 Type 2 diabetes mellitus with other specified complication: Secondary | ICD-10-CM | POA: Diagnosis not present

## 2020-04-24 DIAGNOSIS — Z0001 Encounter for general adult medical examination with abnormal findings: Secondary | ICD-10-CM

## 2020-04-24 DIAGNOSIS — Z1211 Encounter for screening for malignant neoplasm of colon: Secondary | ICD-10-CM

## 2020-04-24 DIAGNOSIS — Z79899 Other long term (current) drug therapy: Secondary | ICD-10-CM

## 2020-04-24 DIAGNOSIS — E559 Vitamin D deficiency, unspecified: Secondary | ICD-10-CM

## 2020-04-24 DIAGNOSIS — Z0000 Encounter for general adult medical examination without abnormal findings: Secondary | ICD-10-CM | POA: Diagnosis not present

## 2020-04-24 DIAGNOSIS — Z8249 Family history of ischemic heart disease and other diseases of the circulatory system: Secondary | ICD-10-CM

## 2020-04-24 DIAGNOSIS — Z87891 Personal history of nicotine dependence: Secondary | ICD-10-CM

## 2020-04-24 DIAGNOSIS — N138 Other obstructive and reflux uropathy: Secondary | ICD-10-CM

## 2020-04-24 DIAGNOSIS — R7309 Other abnormal glucose: Secondary | ICD-10-CM

## 2020-04-24 DIAGNOSIS — N401 Enlarged prostate with lower urinary tract symptoms: Secondary | ICD-10-CM

## 2020-04-24 MED ORDER — MONTELUKAST SODIUM 10 MG PO TABS: ORAL_TABLET | ORAL | 3 refills | Status: DC

## 2020-04-24 NOTE — Progress Notes (Signed)
AortaScan < 3 cm. Within normal limits, per Dr [name]. 

## 2020-04-24 NOTE — Addendum Note (Signed)
Addended by: Unk Pinto on: 04/24/2020 10:04 PM   Modules accepted: Orders

## 2020-04-25 LAB — MICROALBUMIN / CREATININE URINE RATIO
Creatinine, Urine: 138 mg/dL (ref 20–320)
Microalb Creat Ratio: 7 mcg/mg creat (ref <30)
Microalb, Ur: 0.9 mg/dL

## 2020-04-25 LAB — CBC WITH DIFFERENTIAL/PLATELET
Absolute Monocytes: 608 cells/uL (ref 200–950)
Basophils Absolute: 51 cells/uL (ref 0–200)
Basophils Relative: 0.8 %
Eosinophils Absolute: 307 cells/uL (ref 15–500)
Eosinophils Relative: 4.8 %
HCT: 31.9 % — ABNORMAL LOW (ref 38.5–50.0)
Hemoglobin: 9.4 g/dL — ABNORMAL LOW (ref 13.2–17.1)
Lymphs Abs: 1619 cells/uL (ref 850–3900)
MCH: 21.2 pg — ABNORMAL LOW (ref 27.0–33.0)
MCHC: 29.5 g/dL — ABNORMAL LOW (ref 32.0–36.0)
MCV: 72 fL — ABNORMAL LOW (ref 80.0–100.0)
MPV: 10.3 fL (ref 7.5–12.5)
Monocytes Relative: 9.5 %
Neutro Abs: 3814 cells/uL (ref 1500–7800)
Neutrophils Relative %: 59.6 %
Platelets: 379 10*3/uL (ref 140–400)
RBC: 4.43 10*6/uL (ref 4.20–5.80)
RDW: 17.3 % — ABNORMAL HIGH (ref 11.0–15.0)
Total Lymphocyte: 25.3 %
WBC: 6.4 10*3/uL (ref 3.8–10.8)

## 2020-04-25 LAB — COMPLETE METABOLIC PANEL WITH GFR
AG Ratio: 1.6 (calc) (ref 1.0–2.5)
ALT: 16 U/L (ref 9–46)
AST: 19 U/L (ref 10–35)
Albumin: 4.4 g/dL (ref 3.6–5.1)
Alkaline phosphatase (APISO): 39 U/L (ref 35–144)
BUN/Creatinine Ratio: 16 (calc) (ref 6–22)
BUN: 28 mg/dL — ABNORMAL HIGH (ref 7–25)
CO2: 27 mmol/L (ref 20–32)
Calcium: 9.7 mg/dL (ref 8.6–10.3)
Chloride: 107 mmol/L (ref 98–110)
Creat: 1.71 mg/dL — ABNORMAL HIGH (ref 0.70–1.18)
GFR, Est African American: 44 mL/min/{1.73_m2} — ABNORMAL LOW (ref >=60)
GFR, Est Non African American: 38 mL/min/{1.73_m2} — ABNORMAL LOW (ref >=60)
Globulin: 2.7 g/dL (calc) (ref 1.9–3.7)
Glucose, Bld: 106 mg/dL — ABNORMAL HIGH (ref 65–99)
Potassium: 4.8 mmol/L (ref 3.5–5.3)
Sodium: 142 mmol/L (ref 135–146)
Total Bilirubin: 0.6 mg/dL (ref 0.2–1.2)
Total Protein: 7.1 g/dL (ref 6.1–8.1)

## 2020-04-25 LAB — PTH, INTACT AND CALCIUM
Calcium: 9.7 mg/dL (ref 8.6–10.3)
PTH: 17 pg/mL (ref 14–64)

## 2020-04-25 LAB — LIPID PANEL
Cholesterol: 186 mg/dL (ref <200)
HDL: 49 mg/dL (ref >=40)
LDL Cholesterol (Calc): 112 mg/dL (calc) — ABNORMAL HIGH
Non-HDL Cholesterol (Calc): 137 mg/dL (calc) — ABNORMAL HIGH (ref <130)
Total CHOL/HDL Ratio: 3.8 (calc) (ref <5.0)
Triglycerides: 135 mg/dL (ref <150)

## 2020-04-25 LAB — HEMOGLOBIN A1C
Hgb A1c MFr Bld: 6.5 % of total Hgb — ABNORMAL HIGH (ref <5.7)
Mean Plasma Glucose: 140 mg/dL
eAG (mmol/L): 7.7 mmol/L

## 2020-04-25 LAB — URINALYSIS, ROUTINE W REFLEX MICROSCOPIC
Bilirubin Urine: NEGATIVE
Glucose, UA: NEGATIVE
Hgb urine dipstick: NEGATIVE
Ketones, ur: NEGATIVE
Leukocytes,Ua: NEGATIVE
Nitrite: NEGATIVE
Protein, ur: NEGATIVE
Specific Gravity, Urine: 1.019 (ref 1.001–1.03)
pH: 6 (ref 5.0–8.0)

## 2020-04-25 LAB — INSULIN, RANDOM: Insulin: 25 u[IU]/mL — ABNORMAL HIGH

## 2020-04-25 LAB — VITAMIN D 25 HYDROXY (VIT D DEFICIENCY, FRACTURES): Vit D, 25-Hydroxy: 79 ng/mL (ref 30–100)

## 2020-04-25 LAB — PSA: PSA: 1.3 ng/mL (ref <=4.0)

## 2020-04-25 LAB — TSH: TSH: 3.05 mIU/L (ref 0.40–4.50)

## 2020-04-25 LAB — MAGNESIUM: Magnesium: 1.9 mg/dL (ref 1.5–2.5)

## 2020-04-26 DIAGNOSIS — Z96642 Presence of left artificial hip joint: Secondary | ICD-10-CM | POA: Diagnosis not present

## 2020-04-26 DIAGNOSIS — M5126 Other intervertebral disc displacement, lumbar region: Secondary | ICD-10-CM | POA: Diagnosis not present

## 2020-04-26 DIAGNOSIS — R531 Weakness: Secondary | ICD-10-CM | POA: Diagnosis not present

## 2020-04-26 DIAGNOSIS — Z4789 Encounter for other orthopedic aftercare: Secondary | ICD-10-CM | POA: Diagnosis not present

## 2020-04-29 DIAGNOSIS — M5126 Other intervertebral disc displacement, lumbar region: Secondary | ICD-10-CM | POA: Diagnosis not present

## 2020-04-29 DIAGNOSIS — Z96642 Presence of left artificial hip joint: Secondary | ICD-10-CM | POA: Diagnosis not present

## 2020-04-29 DIAGNOSIS — Z4789 Encounter for other orthopedic aftercare: Secondary | ICD-10-CM | POA: Diagnosis not present

## 2020-04-29 DIAGNOSIS — R531 Weakness: Secondary | ICD-10-CM | POA: Diagnosis not present

## 2020-05-01 DIAGNOSIS — Z96642 Presence of left artificial hip joint: Secondary | ICD-10-CM | POA: Diagnosis not present

## 2020-05-01 DIAGNOSIS — R531 Weakness: Secondary | ICD-10-CM | POA: Diagnosis not present

## 2020-05-01 DIAGNOSIS — Z4789 Encounter for other orthopedic aftercare: Secondary | ICD-10-CM | POA: Diagnosis not present

## 2020-05-01 DIAGNOSIS — M5126 Other intervertebral disc displacement, lumbar region: Secondary | ICD-10-CM | POA: Diagnosis not present

## 2020-05-13 ENCOUNTER — Other Ambulatory Visit: Payer: Self-pay | Admitting: Gastroenterology

## 2020-05-29 DIAGNOSIS — R531 Weakness: Secondary | ICD-10-CM | POA: Diagnosis not present

## 2020-05-29 DIAGNOSIS — Z96642 Presence of left artificial hip joint: Secondary | ICD-10-CM | POA: Diagnosis not present

## 2020-05-29 DIAGNOSIS — M5126 Other intervertebral disc displacement, lumbar region: Secondary | ICD-10-CM | POA: Diagnosis not present

## 2020-05-29 DIAGNOSIS — Z4789 Encounter for other orthopedic aftercare: Secondary | ICD-10-CM | POA: Diagnosis not present

## 2020-06-07 DIAGNOSIS — M5126 Other intervertebral disc displacement, lumbar region: Secondary | ICD-10-CM | POA: Diagnosis not present

## 2020-06-07 DIAGNOSIS — Z4789 Encounter for other orthopedic aftercare: Secondary | ICD-10-CM | POA: Diagnosis not present

## 2020-06-07 DIAGNOSIS — R531 Weakness: Secondary | ICD-10-CM | POA: Diagnosis not present

## 2020-06-07 DIAGNOSIS — Z96642 Presence of left artificial hip joint: Secondary | ICD-10-CM | POA: Diagnosis not present

## 2020-06-11 DIAGNOSIS — Z96642 Presence of left artificial hip joint: Secondary | ICD-10-CM | POA: Diagnosis not present

## 2020-06-11 DIAGNOSIS — M6281 Muscle weakness (generalized): Secondary | ICD-10-CM | POA: Diagnosis not present

## 2020-06-11 DIAGNOSIS — M25552 Pain in left hip: Secondary | ICD-10-CM | POA: Diagnosis not present

## 2020-06-13 DIAGNOSIS — Z96642 Presence of left artificial hip joint: Secondary | ICD-10-CM | POA: Diagnosis not present

## 2020-06-13 DIAGNOSIS — M6281 Muscle weakness (generalized): Secondary | ICD-10-CM | POA: Diagnosis not present

## 2020-06-13 DIAGNOSIS — M25552 Pain in left hip: Secondary | ICD-10-CM | POA: Diagnosis not present

## 2020-06-18 ENCOUNTER — Other Ambulatory Visit: Payer: Self-pay

## 2020-06-18 DIAGNOSIS — Z1211 Encounter for screening for malignant neoplasm of colon: Secondary | ICD-10-CM

## 2020-06-18 LAB — POC HEMOCCULT BLD/STL (HOME/3-CARD/SCREEN)
Card #2 Fecal Occult Blod, POC: NEGATIVE
Card #3 Fecal Occult Blood, POC: NEGATIVE
Fecal Occult Blood, POC: NEGATIVE

## 2020-06-24 DIAGNOSIS — Z1211 Encounter for screening for malignant neoplasm of colon: Secondary | ICD-10-CM | POA: Diagnosis not present

## 2020-06-24 DIAGNOSIS — Z1212 Encounter for screening for malignant neoplasm of rectum: Secondary | ICD-10-CM | POA: Diagnosis not present

## 2020-07-10 DIAGNOSIS — Z4789 Encounter for other orthopedic aftercare: Secondary | ICD-10-CM | POA: Diagnosis not present

## 2020-07-10 DIAGNOSIS — M5126 Other intervertebral disc displacement, lumbar region: Secondary | ICD-10-CM | POA: Diagnosis not present

## 2020-07-10 DIAGNOSIS — Z96642 Presence of left artificial hip joint: Secondary | ICD-10-CM | POA: Diagnosis not present

## 2020-07-10 DIAGNOSIS — R531 Weakness: Secondary | ICD-10-CM | POA: Diagnosis not present

## 2020-07-15 DIAGNOSIS — H2512 Age-related nuclear cataract, left eye: Secondary | ICD-10-CM | POA: Diagnosis not present

## 2020-07-15 DIAGNOSIS — H353131 Nonexudative age-related macular degeneration, bilateral, early dry stage: Secondary | ICD-10-CM | POA: Diagnosis not present

## 2020-07-15 DIAGNOSIS — H2513 Age-related nuclear cataract, bilateral: Secondary | ICD-10-CM | POA: Diagnosis not present

## 2020-07-15 DIAGNOSIS — H25043 Posterior subcapsular polar age-related cataract, bilateral: Secondary | ICD-10-CM | POA: Diagnosis not present

## 2020-07-15 DIAGNOSIS — H25013 Cortical age-related cataract, bilateral: Secondary | ICD-10-CM | POA: Diagnosis not present

## 2020-07-17 ENCOUNTER — Other Ambulatory Visit: Payer: Self-pay | Admitting: Adult Health Nurse Practitioner

## 2020-07-19 DIAGNOSIS — M5126 Other intervertebral disc displacement, lumbar region: Secondary | ICD-10-CM | POA: Diagnosis not present

## 2020-07-19 DIAGNOSIS — Z96642 Presence of left artificial hip joint: Secondary | ICD-10-CM | POA: Diagnosis not present

## 2020-07-19 DIAGNOSIS — R531 Weakness: Secondary | ICD-10-CM | POA: Diagnosis not present

## 2020-07-19 DIAGNOSIS — Z4789 Encounter for other orthopedic aftercare: Secondary | ICD-10-CM | POA: Diagnosis not present

## 2020-07-23 ENCOUNTER — Other Ambulatory Visit: Payer: Self-pay

## 2020-07-23 ENCOUNTER — Ambulatory Visit (INDEPENDENT_AMBULATORY_CARE_PROVIDER_SITE_OTHER): Payer: Medicare Other | Admitting: Internal Medicine

## 2020-07-23 VITALS — BP 140/78 | HR 57 | Temp 97.3°F | Resp 16 | Ht 71.0 in | Wt 260.4 lb

## 2020-07-23 DIAGNOSIS — S7011XA Contusion of right thigh, initial encounter: Secondary | ICD-10-CM

## 2020-07-23 DIAGNOSIS — I10 Essential (primary) hypertension: Secondary | ICD-10-CM

## 2020-07-24 DIAGNOSIS — M5126 Other intervertebral disc displacement, lumbar region: Secondary | ICD-10-CM | POA: Diagnosis not present

## 2020-07-24 DIAGNOSIS — R531 Weakness: Secondary | ICD-10-CM | POA: Diagnosis not present

## 2020-07-24 DIAGNOSIS — Z4789 Encounter for other orthopedic aftercare: Secondary | ICD-10-CM | POA: Diagnosis not present

## 2020-07-24 DIAGNOSIS — Z96642 Presence of left artificial hip joint: Secondary | ICD-10-CM | POA: Diagnosis not present

## 2020-07-26 DIAGNOSIS — Z4789 Encounter for other orthopedic aftercare: Secondary | ICD-10-CM | POA: Diagnosis not present

## 2020-07-26 DIAGNOSIS — R531 Weakness: Secondary | ICD-10-CM | POA: Diagnosis not present

## 2020-07-26 DIAGNOSIS — M5126 Other intervertebral disc displacement, lumbar region: Secondary | ICD-10-CM | POA: Diagnosis not present

## 2020-07-26 DIAGNOSIS — Z96642 Presence of left artificial hip joint: Secondary | ICD-10-CM | POA: Diagnosis not present

## 2020-07-28 ENCOUNTER — Encounter: Payer: Self-pay | Admitting: Internal Medicine

## 2020-07-28 NOTE — Patient Instructions (Signed)
Contusion A contusion is a deep bruise. Contusions are the result of a blunt injury to tissues and muscle fibers under the skin. The injury causes bleeding under the skin. The skin overlying the contusion may turn blue, purple, or yellow. Minor injuries will give you a painless contusion, but more severe injuries cause contusions that may stay painful and swollen for a few weeks. Follow these instructions at home: Pay attention to any changes in your symptoms. Let your health care provider know about them. Take these actions to relieve your pain. Managing pain, stiffness, and swelling  Use resting, icing, applying pressure (compression), and raising (elevating) the injured area. This is often called the RICE strategy. ? Rest the injured area. Return to your normal activities as told by your health care provider. Ask your health care provider what activities are safe for you. ? If directed, put ice on the injured area:  Put ice in a plastic bag.  Place a towel between your skin and the bag.  Leave the ice on for 20 minutes, 2-3 times per day. ? If directed, apply light compression to the injured area using an elastic bandage. Make sure the bandage is not wrapped too tightly. Remove and reapply the bandage as directed by your health care provider. ? If possible, raise (elevate) the injured area above the level of your heart while you are sitting or lying down.   General instructions  Take over-the-counter and prescription medicines only as told by your health care provider.  Keep all follow-up visits as told by your health care provider. This is important. Contact a health care provider if:  Your symptoms do not improve after several days of treatment.  Your symptoms get worse.  You have difficulty moving the injured area. Get help right away if:  You have severe pain.  You have numbness in a hand or foot.  Your hand or foot turns pale or cold. Summary  A contusion is a deep  bruise.  Contusions are the result of a blunt injury to tissues and muscle fibers under the skin.  It is treated with rest, ice, compression, and elevation. You may be given over-the-counter medicines for pain.  Contact a health care provider if your symptoms do not improve, or get worse.  Get help right away if you have severe pain, have numbness, or the area turns pale or cold. This information is not intended to replace advice given to you by your health care provider. Make sure you discuss any questions you have with your health care provider. Document Revised: 10/07/2017 Document Reviewed: 10/07/2017 Elsevier Patient Education  [place].

## 2020-07-28 NOTE — Progress Notes (Signed)
History of Present Illness:     Patient is a very nice 80 yo MWM presenting for a brief visit to check his RLE after falling off of his riding lawn mower as it rolled over on a steep embankment and actually "pinned' his leg. A neighbor helped free him from the lawnmower & he relates that he did experience discomfort over the distal medial Rt thigh. He denies any difficulty ambulating or bearing weight on the extremity.   Medications  .  bisoprolol-hydrochlorothiazide (ZIAC) 5-6.25 MG tablet,  Take 1 tablet Daily for BP .  fenofibrate (TRICOR) 145 MG tablet, TAKE 1 TABLET DAILY FOR TRIGLYCERIDES (BLOOD FATS) .  losartan (COZAAR) 100 MG tablet, TAKE 1 TABLET BY MOUTH DAILY FOR BLOOD PRESSURE .  sildenafil (VIAGRA) 100 MG tablet, Take 100 mg by mouth daily as needed for erectile dysfunction. .  montelukast (SINGULAIR) 10 MG tablet, Take 1 tablet daily for Allergies & Asthma .  aspirin EC 81 MG tablet, Restart on 12/11/2019 .  meloxicam (MOBIC) 15 MG tablet, TAKE 1/2 TO 1 TABLET DAILY WITH FOOD FOR PAIN  .  vitamin B-12 (CYANOCOBALAMIN) 1000 MCG tablet, Take 1 tablet (1,000 mcg total) by mouth daily. Marland Kitchen  b complex vitamins tablet, Take 1 tablet by mouth daily. .  Cholecalciferol (VITAMIN D PO), Take 5,000 Units by mouth daily. .  Cholecalciferol (VITAMIN D) 125 MCG (5000 UT) CAPS, Take 5,000 Units by mouth daily. .  finasteride (PROSCAR) 5 MG tablet, Take 5 mg by mouth daily. .  Flaxseed, Linseed, (FLAX SEEDS PO), Take 1,000 mg by mouth daily.  Marland Kitchen  gabapentin (NEURONTIN) 300 MG capsule, Take 300 mg by mouth 3 (three) times daily. .  Magnesium Oxide 250 MG TABS, Take 250 mg by mouth 2 (two) times daily.  .  Omega-3 Fatty Acids (FISH OIL) 1200 MG CAPS, Restart on 10.11 .  pantoprazole (PROTONIX) 40 MG tablet, TAKE 1 TABLET BY MOUTH TWICE A DAY. .  tamsulosin (FLOMAX) 0.4 MG CAPS capsule, Take 0.4 mg by mouth daily after breakfast.   Problem list He has Stricture and stenosis of esophagus;  Hyperlipidemia; Hypertension; GERD (gastroesophageal reflux disease); Kidney stones; Morbidly obese ([place]); Vitamin D deficiency; Medication management; Hyperparathyroidism, primary ([place]); Encounter for Medicare annual wellness exam; CKD (chronic kidney disease) stage 3, GFR 30-59 ml/min ([place]); Snoring; OA (osteoarthritis) of hip; Normocytic anemia; Vitamin B12 deficiency; Herniated nucleus pulposus, L2-3 left; Abnormal glucose; Spinal stenosis; Chronic congestive heart failure, unspecified heart failure type ([place]); Hyperlipidemia associated with type 2 diabetes mellitus ([place]); Fixation hardware in spine; and Aortic atherosclerosis (HCC) by Lumbar CT scan on 11/22/2019 on their problem list.   Observations/Objective:  BP 140/78   Pulse (!) 57   Temp (!) 97.3 F (36.3 C)   Resp 16   Ht 5\' 11"  (1.803 m)   Wt 260 lb 6.4 oz (118.1 kg)   SpO2 99%   BMI 36.32 kg/m   HEENT - WNL. Neck - supple.  Chest - Clear equal BS. Cor - Nl HS. RRR w/o sig MGR. PP 1(+). No edema. No Calf tenderness. MS- FROM w/o deformities. Bilat hip & Knee ROM is full & Normal.           Gait Nl. There is tender STS at the distal medial Rt thigh  Neuro -  Nl w/o focal abnormalities.  Assessment and Plan:  1. Contusion of right thigh  - reassured patient & advised ROV if changes   Follow Up Instructions:  I discussed the assessment and treatment plan with the patient. The patient was provided an opportunity to ask questions and all were answered. The patient agreed with the plan and demonstrated an understanding of the instructions.       The patient was advised to call back or seek an in-person evaluation if the symptoms worsen or if the condition fails to improve as anticipated.   Kirtland Bouchard, MD

## 2020-07-30 DIAGNOSIS — H2511 Age-related nuclear cataract, right eye: Secondary | ICD-10-CM | POA: Diagnosis not present

## 2020-07-30 DIAGNOSIS — H2512 Age-related nuclear cataract, left eye: Secondary | ICD-10-CM | POA: Diagnosis not present

## 2020-08-06 DIAGNOSIS — H2511 Age-related nuclear cataract, right eye: Secondary | ICD-10-CM | POA: Diagnosis not present

## 2020-08-12 ENCOUNTER — Ambulatory Visit: Payer: Medicare Other | Admitting: Adult Health

## 2020-09-11 DIAGNOSIS — Z96642 Presence of left artificial hip joint: Secondary | ICD-10-CM | POA: Diagnosis not present

## 2020-09-11 DIAGNOSIS — Z4789 Encounter for other orthopedic aftercare: Secondary | ICD-10-CM | POA: Diagnosis not present

## 2020-09-11 DIAGNOSIS — M5126 Other intervertebral disc displacement, lumbar region: Secondary | ICD-10-CM | POA: Diagnosis not present

## 2020-09-11 DIAGNOSIS — R531 Weakness: Secondary | ICD-10-CM | POA: Diagnosis not present

## 2020-09-12 ENCOUNTER — Other Ambulatory Visit: Payer: Self-pay

## 2020-09-12 MED ORDER — BISOPROLOL-HYDROCHLOROTHIAZIDE 5-6.25 MG PO TABS: 1.0000 | ORAL_TABLET | Freq: Every morning | ORAL | 1 refills | Status: DC

## 2020-09-18 DIAGNOSIS — Z4789 Encounter for other orthopedic aftercare: Secondary | ICD-10-CM | POA: Diagnosis not present

## 2020-09-18 DIAGNOSIS — R531 Weakness: Secondary | ICD-10-CM | POA: Diagnosis not present

## 2020-09-18 DIAGNOSIS — Z96642 Presence of left artificial hip joint: Secondary | ICD-10-CM | POA: Diagnosis not present

## 2020-09-18 DIAGNOSIS — M5126 Other intervertebral disc displacement, lumbar region: Secondary | ICD-10-CM | POA: Diagnosis not present

## 2020-09-20 DIAGNOSIS — Z96642 Presence of left artificial hip joint: Secondary | ICD-10-CM | POA: Diagnosis not present

## 2020-09-20 DIAGNOSIS — Z4789 Encounter for other orthopedic aftercare: Secondary | ICD-10-CM | POA: Diagnosis not present

## 2020-09-20 DIAGNOSIS — R531 Weakness: Secondary | ICD-10-CM | POA: Diagnosis not present

## 2020-09-20 DIAGNOSIS — M5126 Other intervertebral disc displacement, lumbar region: Secondary | ICD-10-CM | POA: Diagnosis not present

## 2020-09-24 DIAGNOSIS — N200 Calculus of kidney: Secondary | ICD-10-CM | POA: Diagnosis not present

## 2020-09-25 DIAGNOSIS — R531 Weakness: Secondary | ICD-10-CM | POA: Diagnosis not present

## 2020-09-25 DIAGNOSIS — M5126 Other intervertebral disc displacement, lumbar region: Secondary | ICD-10-CM | POA: Diagnosis not present

## 2020-09-25 DIAGNOSIS — Z96642 Presence of left artificial hip joint: Secondary | ICD-10-CM | POA: Diagnosis not present

## 2020-09-25 DIAGNOSIS — Z4789 Encounter for other orthopedic aftercare: Secondary | ICD-10-CM | POA: Diagnosis not present

## 2020-09-27 DIAGNOSIS — M5126 Other intervertebral disc displacement, lumbar region: Secondary | ICD-10-CM | POA: Diagnosis not present

## 2020-09-27 DIAGNOSIS — Z96642 Presence of left artificial hip joint: Secondary | ICD-10-CM | POA: Diagnosis not present

## 2020-09-27 DIAGNOSIS — R531 Weakness: Secondary | ICD-10-CM | POA: Diagnosis not present

## 2020-09-27 DIAGNOSIS — Z4789 Encounter for other orthopedic aftercare: Secondary | ICD-10-CM | POA: Diagnosis not present

## 2020-10-02 DIAGNOSIS — R531 Weakness: Secondary | ICD-10-CM | POA: Diagnosis not present

## 2020-10-02 DIAGNOSIS — Z96642 Presence of left artificial hip joint: Secondary | ICD-10-CM | POA: Diagnosis not present

## 2020-10-02 DIAGNOSIS — Z4789 Encounter for other orthopedic aftercare: Secondary | ICD-10-CM | POA: Diagnosis not present

## 2020-10-02 DIAGNOSIS — M5126 Other intervertebral disc displacement, lumbar region: Secondary | ICD-10-CM | POA: Diagnosis not present

## 2020-10-03 ENCOUNTER — Other Ambulatory Visit: Payer: Self-pay

## 2020-10-03 ENCOUNTER — Encounter: Payer: Self-pay | Admitting: Adult Health

## 2020-10-03 ENCOUNTER — Ambulatory Visit (INDEPENDENT_AMBULATORY_CARE_PROVIDER_SITE_OTHER): Payer: Medicare Other | Admitting: Adult Health

## 2020-10-03 VITALS — BP 122/70 | HR 52 | Temp 97.9°F | Wt 250.0 lb

## 2020-10-03 DIAGNOSIS — Z967 Presence of other bone and tendon implants: Secondary | ICD-10-CM | POA: Diagnosis not present

## 2020-10-03 DIAGNOSIS — E559 Vitamin D deficiency, unspecified: Secondary | ICD-10-CM | POA: Diagnosis not present

## 2020-10-03 DIAGNOSIS — Z79899 Other long term (current) drug therapy: Secondary | ICD-10-CM

## 2020-10-03 DIAGNOSIS — Z86010 Personal history of colonic polyps: Secondary | ICD-10-CM

## 2020-10-03 DIAGNOSIS — N183 Chronic kidney disease, stage 3 unspecified: Secondary | ICD-10-CM

## 2020-10-03 DIAGNOSIS — E538 Deficiency of other specified B group vitamins: Secondary | ICD-10-CM

## 2020-10-03 DIAGNOSIS — N1831 Chronic kidney disease, stage 3a: Secondary | ICD-10-CM

## 2020-10-03 DIAGNOSIS — N521 Erectile dysfunction due to diseases classified elsewhere: Secondary | ICD-10-CM

## 2020-10-03 DIAGNOSIS — E669 Obesity, unspecified: Secondary | ICD-10-CM

## 2020-10-03 DIAGNOSIS — I700 Atherosclerosis of aorta: Secondary | ICD-10-CM | POA: Diagnosis not present

## 2020-10-03 DIAGNOSIS — E1122 Type 2 diabetes mellitus with diabetic chronic kidney disease: Secondary | ICD-10-CM

## 2020-10-03 DIAGNOSIS — D649 Anemia, unspecified: Secondary | ICD-10-CM

## 2020-10-03 DIAGNOSIS — Z0001 Encounter for general adult medical examination with abnormal findings: Secondary | ICD-10-CM

## 2020-10-03 DIAGNOSIS — G720 Drug-induced myopathy: Secondary | ICD-10-CM

## 2020-10-03 DIAGNOSIS — E210 Primary hyperparathyroidism: Secondary | ICD-10-CM

## 2020-10-03 DIAGNOSIS — R1310 Dysphagia, unspecified: Secondary | ICD-10-CM

## 2020-10-03 DIAGNOSIS — Z0000 Encounter for general adult medical examination without abnormal findings: Secondary | ICD-10-CM

## 2020-10-03 DIAGNOSIS — T466X5A Adverse effect of antihyperlipidemic and antiarteriosclerotic drugs, initial encounter: Secondary | ICD-10-CM

## 2020-10-03 DIAGNOSIS — I10 Essential (primary) hypertension: Secondary | ICD-10-CM

## 2020-10-03 DIAGNOSIS — E1169 Type 2 diabetes mellitus with other specified complication: Secondary | ICD-10-CM | POA: Diagnosis not present

## 2020-10-03 DIAGNOSIS — E785 Hyperlipidemia, unspecified: Secondary | ICD-10-CM | POA: Diagnosis not present

## 2020-10-03 DIAGNOSIS — R6889 Other general symptoms and signs: Secondary | ICD-10-CM | POA: Diagnosis not present

## 2020-10-03 DIAGNOSIS — K219 Gastro-esophageal reflux disease without esophagitis: Secondary | ICD-10-CM

## 2020-10-03 MED ORDER — CETIRIZINE HCL 10 MG PO TABS: 10.0000 mg | ORAL_TABLET | Freq: Every day | ORAL | 1 refills | Status: DC

## 2020-10-03 NOTE — Patient Instructions (Addendum)
Ask insurance about shingrix - can get at [facility], etc    Try zyrtec/certirizine (allergy pill for cough)  Avoid meloxicam, advil, ibuprofen, aleve - NSAIDs  Tylenol is safer pain option for kidneys Can do 1-2 extra strength 3 times a day as needed  Gabapentin also safer option  Try to limit use of NSAID   Chronic Kidney Disease, Adult Chronic kidney disease (CKD) occurs when the kidneys are slowly and permanently damaged over a long period of time. The kidneys are a pair of organs that do many important jobs in the body, including: Removing waste and extra fluid from the blood to make urine. Making hormones that maintain the amount of fluid in tissues and blood vessels. Maintaining the right amount of fluids and chemicals in the body. A small amount of kidney damage may not cause problems, but a large amount of damage may make it hard or impossible for the kidneys to work right. Steps must be taken to slow kidney damage or to stop it from getting worse. If steps are not taken, the kidneys may stop working permanently (end-stage renal disease, or ESRD). Most of the time, CKD does not go away, but it can often becontrolled. People who have CKD are usually able to live full lives. What are the causes? The most common causes of this condition are diabetes and high blood pressure (hypertension). Other causes include: Cardiovascular diseases. These affect the heart and blood vessels. Kidney diseases. These include: Glomerulonephritis, or inflammation of the tiny filters in the kidneys. Interstitial nephritis. This is swelling of the small tubes of the kidneys and of the surrounding structures. Polycystic kidney disease, in which clusters of fluid-filled sacs form within the kidneys. Renal vascular disease. This includes disorders that affect the arteries and veins of the kidneys. Diseases that affect the body's defense system (immune system). A problem with urine flow. This may be caused  by: Kidney stones. Cancer. An enlarged prostate, in males. A kidney infection or urinary tract infection (UTI) that keeps coming back. Vasculitis. This is swelling or inflammation of the blood vessels. What increases the risk? Your chances of having kidney disease increase with age. The following factors may make you more likely to develop this condition: A family history of kidney disease or kidney failure. Kidney failure means the kidneys can no longer work right. Certain genetic diseases. Taking medicines often that are damaging to the kidneys. Being around or being in contact with toxic substances. Obesity. A history of tobacco use. What are the signs or symptoms? Symptoms of this condition include: Feeling very tired (lethargic) and having less energy. Swelling, or edema, of the face, legs, ankles, or feet. Nausea or vomiting, or loss of appetite. Confusion or trouble concentrating. Muscle twitches and cramps, especially in the legs. Dry, itchy skin. A metallic taste in the mouth. Producing less urine, or producing more urine (especially at night). Shortness of breath. Trouble sleeping. CKD may also result in not having enough red blood cells or hemoglobin in the blood (anemia) or having weak bones (bone disease). Symptoms develop slowly and may not be obvious until the kidney damage becomessevere. It is possible to have kidney disease for years without having symptoms. How is this diagnosed? This condition may be diagnosed based on: Blood tests. Urine tests. Imaging tests, such as an ultrasound or a CT scan. A kidney biopsy. This involves removing a sample of kidney tissue to be looked at under a microscope. Results from these tests will help to determine  how serious the CKD is. How is this treated? There is no cure for most cases of this condition, but treatment usually relieves symptoms and prevents or slows the worsening of the disease. Treatment may include: Diet  changes, which may require you to avoid alcohol and foods that are high in salt, potassium, phosphorous, and protein. Medicines. These may: Lower blood pressure. Control blood sugar (glucose). Relieve anemia. Relieve swelling. Protect your bones. Improve the balance of salts and minerals in your blood (electrolytes). Dialysis, which is a type of treatment that removes toxic waste from the body. It may be needed if you have kidney failure. Managing any other conditions that are causing your CKD or making it worse. Follow these instructions at home: Medicines Take over-the-counter and prescription medicines only as told by your health care provider. The amount of some medicines that you take may need to be changed. Do not take any new medicines unless approved by your health care provider. Many medicines can make kidney damage worse. Do not take any vitamin and mineral supplements unless approved by your health care provider. Many nutritional supplements can make kidney damage worse. Lifestyle  Do not use any products that contain nicotine or tobacco, such as cigarettes, e-cigarettes, and chewing tobacco. If you need help quitting, ask your health care provider. If you drink alcohol: Limit how much you use to: 0-1 drink a day for women who are not pregnant. 0-2 drinks a day for men. Know how much alcohol is in your drink. In the [place] one drink equals one 12 oz bottle of beer (355 mL), one 5 oz glass of wine (148 mL), or one 1 oz glass of hard liquor (44 mL). Maintain a healthy weight. If you need help, ask your health care provider.  General instructions  Follow instructions from your health care provider about eating or drinking restrictions, including any prescribed diet. Track your blood pressure at home. Report changes in your blood pressure as told. If you are being treated for diabetes, track your blood glucose levels as told. Start or continue an exercise plan. Exercise at least  30 minutes a day, 5 days a week. Keep your immunizations up to date as told. Keep all follow-up visits. This is important.  Where to find more information American Association of Kidney Patients: BombTimer.gl National Kidney Foundation: www.kidney.[place]: https://mathis.com/ Life Options: www.lifeoptions.org Kidney School: www.kidneyschool.org Contact a health care provider if: Your symptoms get worse. You develop new symptoms. Get help right away if: You develop symptoms of ESRD. These include: Headaches. Numbness in your hands or feet. Easy bruising. Frequent hiccups. Chest pain. Shortness of breath. Lack of menstrual periods, in women. You have a fever. You are producing less urine than usual. You have pain or bleeding when you urinate or when you have a bowel movement. These symptoms may represent a serious problem that is an emergency. Do not wait to see if the symptoms will go away. Get medical help right away. Call your local emergency services (911 in the [place] Do not drive yourself to the hospital. Summary Chronic kidney disease (CKD) occurs when the kidneys become damaged slowly over a long period of time. The most common causes of this condition are diabetes and high blood pressure (hypertension). There is no cure for most cases of CKD, but treatment usually relieves symptoms and prevents or slows the worsening of the disease. Treatment may include a combination of lifestyle changes, medicines, and dialysis. This information is not intended to  replace advice given to you by your health care provider. Make sure you discuss any questions you have with your healthcare provider. Document Revised: 05/24/2019 Document Reviewed: 05/24/2019 Elsevier Patient Education  [place].

## 2020-10-03 NOTE — Progress Notes (Signed)
MEDICARE ANNUAL WELLNESS VISIT AND FOLLOW UP  Assessment:    Annual Medicare Wellness Visit Due annually  Health maintenance reviewed  Atherosclerosis of aorta ([place]) - Per CT 2021 Control blood pressure, cholesterol, glucose, increase exercise.    Essential hypertension Continue current medications Monitor blood pressure at home; call if consistently over 130/80 Continue DASH diet.   Reminder to go to the ER if any CP, SOB, nausea, dizziness, severe HA, changes vision/speech, left arm numbness and tingling and jaw pain. - CBC with Differential/Platelet - CMP/GFR - TSH  Hyperlipidemia associated with type 2 diabetes mellitus (HCC) Statin myopathy Continue fenofibrate, consider zetia Discussed dietary and exercise modifications Low fat diet - lipid panel   Hyperparathyroidism, primary (HCC) S/p partial resection Monitor, recent normal calcium/ PTH Check phos  Type 2 diabetes mellitus with stage 3a chronic kidney disease, without long-term current use of insulin (HCC) Controlled by diet & exercise Continue to monitor - A1C  Stage 3b chronic kidney disease associated with T2DM (HCC) Increase fluids  Avoid NSAIDS - reviewed in detail today, stop meloxicam, aleve - tylenol is ok Blood pressure control Monitor sugars  Will continue to monitor, refer nephrology if trending down further  ED associated with T2DM ([place]) Uro follows; sildenafil PRN  Other secondary osteoarthritis of left hip S/p failed arthroplasty Follows with Dr Mayer Camel Discussed stop meloxicam due to renal, try tylenol, gabapentin  Obesity - BMI 34 Long discussion about weight loss, diet, and exercise Discussed final goal weight  Patient didn't tolerate topamax, has also tried phentermine;  Currently making good progress with lifestyle continue close follow up.   Kidney stones Continue to monitor, push fluids Has seen [facility]  Gastroesophageal reflux disease, unspecified whether esophagitis  present Hx of stricture s/p dilation, no recurrenct on last EGD Well managed on current medications Discussed diet, avoiding triggers and other lifestyle changes  Medication management - Magnesium  Vitamin D deficiency - VITAMIN D 25 Hydroxy (Vit-D Deficiency, Fractures)  Vitamin B12 deficiency  Continue supplementation  History of adenomatous polyp of colon - Due follow up, GI referral placed   Over 30 minutes of face to face interview, exam, counseling, chart review, and critical decision making was performed  Future Appointments  Date Time Provider [place]  02/12/2021  3:30 PM Magda Bernheim, NP [facility] None  05/01/2021  3:00 PM Unk Pinto, MD [facility] None     Plan:   During the course of the visit the patient was educated and counseled about appropriate screening and preventive services including:   Pneumococcal vaccine  Influenza vaccine Prevnar 13 Td vaccine Screening electrocardiogram Colorectal cancer screening Diabetes screening Glaucoma screening Nutrition counseling    Subjective:  [name] [name] is a 80 y.o. male who presents for Medicare Annual Wellness Visit. He has Hypertension; GERD (gastroesophageal reflux disease); Kidney stones; Obesity (BMI 30.0-34.9); Vitamin D deficiency; Medication management; Hyperparathyroidism, primary ([place]); CKD stage 3 due to type 2 diabetes mellitus ([place]); Snoring; OA (osteoarthritis) of hip; Normocytic anemia; Vitamin B12 deficiency; Herniated nucleus pulposus, L2-3 left; Spinal stenosis; Hyperlipidemia associated with type 2 diabetes mellitus ([place]); Fixation hardware in spine; Aortic atherosclerosis (HCC) by Lumbar CT scan on 11/22/2019; Type 2 diabetes mellitus with stage 3a chronic kidney disease, without long-term current use of insulin ([place]); History of adenomatous polyp of colon; Erectile dysfunction associated with type 2 diabetes mellitus ([place]); and Statin myopathy on their problem list.   He has had  multiple surgeries on L hip for failed arthroplasty, Ortho unfortunately suspects  this will be permanent. Continues with PT twice weekly. He followed with Dr Mayer Camel. He notes pain is somewhat worse last few days. Discussing referral to [facility] specialist. He reports taking CBD oil with benefit. He takes meloxicam 15 mg daily in the morning (doesn't note much  BMI is Body mass index is 34.87 kg/m., he is working on diet and exercise. Working on diet, PT twice a week. Didn't tolerate phentermine/topamax (sedation, likely due to topamax).  Down 10 lb since last OV.  Wt Readings from Last 3 Encounters:  10/03/20 250 lb (113.4 kg)  07/23/20 260 lb 6.4 oz (118.1 kg)  04/24/20 261 lb 12.8 oz (118.8 kg)   His blood pressure has been controlled at home, today their BP is BP: 122/70 He does workout, PT 1 hour twice. He denies chest pain, shortness of breath, dizziness.   He has aortic atherosclerosis per CT 11/2019.   He is on cholesterol medication (fenofibrate, fish oil), hx of statin myalgias reported (remote, can't recall agents, numerous per patient). His cholesterol is not at goal. The cholesterol last visit was:   Lab Results  Component Value Date   CHOL 186 04/24/2020   HDL 49 04/24/2020   LDLCALC 112 (H) 04/24/2020   TRIG 135 04/24/2020   CHOLHDL 3.8 04/24/2020   He has been working on diet and exercise for prediabetes/T2DM with CKD 3, and denies paresthesia of the feet, polydipsia, polyuria and visual disturbances. Last A1C in the office was:  Lab Results  Component Value Date   HGBA1C 6.5 (H) 04/24/2020   He has had CKD III for several years, monitoring. Does take NSAIDS, receptive to stopping. Also hx of recurrent renal stones, follows [facility]. Last GFR  Lab Results  Component Value Date   GFRNONAA 38 (L) 04/24/2020   GFRNONAA 48 (L) 12/07/2019   GFRNONAA 45 (L) 12/06/2019   Patient is on Vitamin D supplement.   Lab Results  Component Value Date   VD25OH 7 04/24/2020      He has hx of primary hyperparathyroid, s/p parathyroidectomy by Dr. Harlow Asa with recent normal calcium/PTH:   Lab Results  Component Value Date   PTH 17 04/24/2020   CALCIUM 9.7 04/24/2020   CALCIUM 9.7 04/24/2020   CAION 1.15 12/05/2019   PHOS 2.0 (L) 04/27/2014     Medication Review: Current Outpatient Medications on File Prior to Visit  Medication Sig Dispense Refill   acetaminophen (TYLENOL) 500 MG tablet Take 500-1,000 mg by mouth 3 (three) times daily as needed.     aspirin EC 81 MG tablet Restart on 12/11/2019 1 tablet 0   b complex vitamins tablet Take 1 tablet by mouth daily.     bisoprolol-hydrochlorothiazide (ZIAC) 5-6.25 MG tablet Take 1 tablet by mouth in the morning. Take 1 tablet Daily for BP 90 tablet 1   Cholecalciferol (VITAMIN D PO) Take 5,000 Units by mouth daily.     fenofibrate (TRICOR) 145 MG tablet TAKE 1 TABLET DAILY FOR TRIGLYCERIDES (BLOOD FATS) 90 tablet 3   finasteride (PROSCAR) 5 MG tablet Take 5 mg by mouth daily.     Flaxseed, Linseed, (FLAX SEEDS PO) Take 1,000 mg by mouth daily.      gabapentin (NEURONTIN) 300 MG capsule Take 300 mg by mouth 3 (three) times daily.     losartan (COZAAR) 100 MG tablet TAKE 1 TABLET BY MOUTH DAILY FOR BLOOD PRESSURE 90 tablet 3   Magnesium Oxide 250 MG TABS Take 250 mg by mouth 2 (two) times daily.  montelukast (SINGULAIR) 10 MG tablet Take 1 tablet daily for Allergies & Asthma 90 tablet 3   Omega-3 Fatty Acids (FISH OIL) 1200 MG CAPS Restart on 10.11 1 capsule 0   pantoprazole (PROTONIX) 40 MG tablet TAKE 1 TABLET BY MOUTH TWICE A DAY. 180 tablet 1   sildenafil (VIAGRA) 100 MG tablet Take 100 mg by mouth daily as needed for erectile dysfunction.     tamsulosin (FLOMAX) 0.4 MG CAPS capsule Take 0.4 mg by mouth daily after breakfast.   3   vitamin B-12 (CYANOCOBALAMIN) 1000 MCG tablet Take 1 tablet (1,000 mcg total) by mouth daily. 30 tablet 0   Cholecalciferol (VITAMIN D) 125 MCG (5000 UT) CAPS Take 5,000 Units by  mouth daily.     No current facility-administered medications on file prior to visit.    Current Problems (verified) Patient Active Problem List   Diagnosis Date Noted   Type 2 diabetes mellitus with stage 3a chronic kidney disease, without long-term current use of insulin ([place]) 10/03/2020   History of adenomatous polyp of colon 10/03/2020   Erectile dysfunction associated with type 2 diabetes mellitus ([place]) 10/03/2020   Statin myopathy 10/03/2020   Aortic atherosclerosis ([place]) by Lumbar CT scan on 11/22/2019 04/23/2020   Fixation hardware in spine 12/05/2019   Hyperlipidemia associated with type 2 diabetes mellitus ([place]) 09/11/2019   Spinal stenosis 09/05/2019   Herniated nucleus pulposus, L2-3 left 11/09/2017   Normocytic anemia 05/02/2017   Vitamin B12 deficiency 05/02/2017   OA (osteoarthritis) of hip 06/24/2016   CKD stage 3 due to type 2 diabetes mellitus ([place]) 03/25/2015   Snoring 03/25/2015   Hyperparathyroidism, primary ([place]) 07/09/2014   Vitamin D deficiency 06/13/2013   Medication management 06/13/2013   Hypertension    GERD (gastroesophageal reflux disease)    Kidney stones    Obesity (BMI 30.0-34.9)     Screening Tests Immunization History  Administered Date(s) Administered   Influenza Split 12/27/2012   Influenza, High Dose Seasonal PF 01/31/2014, 11/27/2014, 01/12/2017, 01/13/2018   Influenza-Unspecified 12/16/2016, 01/12/2017   PFIZER(Purple Top)SARS-COV-2 Vaccination 04/19/2019, 05/10/2019, 01/31/2020, 09/16/2020   Pneumococcal Conjugate-13 03/25/2015   Pneumococcal Polysaccharide-23 03/02/2008   Td 03/02/2005, 09/24/2015   Zoster, Live 03/02/2006    Preventative care: Last colonoscopy: 01/2017, adenomatous polyps, 3 year recall, Dr. Silverio Decamp EGD 11/2018  Prior vaccinations: TD or Tdap: 2017 Influenza: 2019  Pneumococcal: 2010 Prevnar13: 2017 Shingles/Zostavax: 2008 - ask about shingrix  Covid 19: 2/2 + booster x 2   Names of Other  Physician/Practitioners you currently use: 1. [facility] here for primary care 2. Dr. Katy Fitch, eye doctor, last visit 2022 3. Dr. Altamese [place], dentist, last visit 2022, q35m Patient Care Team: MUnk Pinto MD as PCP - General (Internal Medicine) DNewt Minion MD as Consulting Physician (Orthopedic Surgery) KInda Castle MD (Inactive) as Consulting Physician (Gastroenterology) DFranchot Gallo MD as Consulting Physician (Urology)  Allergies Allergies  Allergen Reactions   Iodinated Diagnostic Agents Shortness Of Breath   Iohexol Shortness Of Breath   Statins     Myalgia, with several per patient    SURGICAL HISTORY He  has a past surgical history that includes tumor ear (Left, age 80; Cystoscopy w/ ureteroscopy w/ lithotripsy (Left, 05/04/2005); Colonoscopy (02/17/2005); Nephrolithotomy (Left, 02/01/2014); Parathyroidectomy (Left, 07/10/2014); CYSTO/  LEFT RETROGRADE PYELOGRAM/ STENT PLACEMENT (01/26/2005); Esophagogastroduodenoscopy (egd) with esophageal dilation (x4  last one 07-21-2010); Inguinal hernia repair (Left, yrs ago); Cystoscopy/retrograde/ureteroscopy/stone extraction with basket (Left, 12/30/2015); Cystoscopy with stent placement (Left, 12/30/2015);  Holmium laser application (Left, [id number]); Cystoscopy/ureteroscopy/holmium laser/stent placement (Left, 03/09/2016); Total hip arthroplasty (Left, 06/24/2016); Total hip revision (Left, 11/18/2016); ORIF hip fracture (Left, 04/16/2017); Lumbar laminectomy/decompression microdiscectomy (Left, 11/09/2017); Back surgery (04/2018); Posterior lumbar fusion (09/07/2019); and Cataract extraction (Bilateral, 2022). FAMILY HISTORY His family history includes Alzheimer's disease in his mother; Bladder Cancer in his sister; Heart disease in his father and mother. SOCIAL HISTORY He  reports that he quit smoking about 46 years ago. His smoking use included cigarettes. He has never used smokeless  tobacco. He reports previous alcohol use of about 1.0 - 2.0 standard drink of alcohol per week. He reports that he does not use drugs.  MEDICARE WELLNESS OBJECTIVES: Physical activity: Current Exercise Habits: Home exercise routine, Type of exercise: Other - see comments (PT), Time (Minutes): 60, Frequency (Times/Week): 2, Weekly Exercise (Minutes/Week): 120, Intensity: Mild, Exercise limited by: orthopedic condition(s) Cardiac risk factors: Cardiac Risk Factors include: advanced age (>25mn, >>58women);dyslipidemia;hypertension;male gender;obesity (BMI >30kg/m2);diabetes mellitus;smoking/ tobacco exposure;sedentary lifestyle Depression/mood screen:   Dep[facility] screen [facility] 10/03/2020  Decreased Interest 0  Down, Depressed, Hopeless 0  PHQ - 2 Score 0  Some recent data might be hidden    ADLs:  In your present state of health, do you have any difficulty performing the following activities: 10/03/2020 02/13/2020  Hearing? N N  Vision? N N  Difficulty concentrating or making decisions? N N  Walking or climbing stairs? Y N  Dressing or bathing? N N  Doing errands, shopping? N N  Preparing Food and eating ? - N  Using the Toilet? - N  In the past six months, have you accidently leaked urine? - N  Do you have problems with loss of bowel control? - N  Managing your Medications? - N  Managing your Finances? - N  Housekeeping or managing your Housekeeping? - N  Some recent data might be hidden     Cognitive Testing  Alert? Yes  Normal Appearance?Yes  Oriented to person? Yes  Place? Yes   Time? Yes  Recall of three objects?  Yes  Can perform simple calculations? Yes  Displays appropriate judgment?Yes  Can read the correct time from a watch face?Yes  EOL planning: Does Patient Have a Medical Advance Directive?: Yes Type of Advance Directive: Healthcare Power of Attorney, Living will Does patient want to make changes to medical advance directive?: No - Patient declined Copy of  [place] Chart?: Yes - validated most recent copy scanned in chart (See row information)   Objective:   Today's Vitals   10/03/20 1528  BP: 122/70  Pulse: (!) 52  Temp: 97.9 F (36.6 C)  SpO2: 99%  Weight: 250 lb (113.4 kg)    Body mass index is 34.87 kg/m.  General appearance: alert, no distress, WD/WN, male HEENT: normocephalic, sclerae anicteric, TMs pearly, nares patent, no discharge or erythema, pharynx normal Oral cavity: MMM, no lesions, crowded mouth Neck: supple, large neck circumference, no lymphadenopathy, no thyromegaly, no masses Heart: RRR, normal S1, S2, no murmurs Lungs: CTA bilaterally, no wheezes, rhonchi, or rales Abdomen: +bs, soft, obese, non tender, non distended, no masses, no hepatomegaly, no splenomegaly Musculoskeletal: nontender, no swelling, no obvious deformity, antalgic gait with cane Extremities: no edema, no cyanosis, no clubbing Pulses: 2+ symmetric, upper and lower extremities, normal cap refill Neurological: alert, oriented x 3, CN2-12 intact, strength normal upper extremities, weakness in bilateral lower extremities, sensation diminished in L thigh, no cerebellar signs, gait antalgic with cane Psychiatric: normal  affect, behavior normal, pleasant   Medicare Attestation I have personally reviewed: The patient's medical and social history Their use of alcohol, tobacco or illicit drugs Their current medications and supplements The patient's functional ability including ADLs,fall risks, home safety risks, cognitive, and hearing and visual impairment Diet and physical activities Evidence for depression or mood disorders  The patient's weight, height, BMI, and visual acuity have been recorded in the chart.  I have made referrals, counseling, and provided education to the patient based on review of the above and I have provided the patient with a written personalized care plan for preventive services.     Izora Ribas,  NP   10/03/2020

## 2020-10-04 ENCOUNTER — Other Ambulatory Visit: Payer: Self-pay | Admitting: Adult Health

## 2020-10-04 DIAGNOSIS — N289 Disorder of kidney and ureter, unspecified: Secondary | ICD-10-CM

## 2020-10-04 LAB — CBC WITH DIFFERENTIAL/PLATELET
Absolute Monocytes: 774 cells/uL (ref 200–950)
Basophils Absolute: 52 cells/uL (ref 0–200)
Basophils Relative: 0.6 %
Eosinophils Absolute: 287 cells/uL (ref 15–500)
Eosinophils Relative: 3.3 %
HCT: 36 % — ABNORMAL LOW (ref 38.5–50.0)
Hemoglobin: 10.8 g/dL — ABNORMAL LOW (ref 13.2–17.1)
Lymphs Abs: 2288 cells/uL (ref 850–3900)
MCH: 22.4 pg — ABNORMAL LOW (ref 27.0–33.0)
MCHC: 30 g/dL — ABNORMAL LOW (ref 32.0–36.0)
MCV: 74.5 fL — ABNORMAL LOW (ref 80.0–100.0)
MPV: 10.4 fL (ref 7.5–12.5)
Monocytes Relative: 8.9 %
Neutro Abs: 5298 cells/uL (ref 1500–7800)
Neutrophils Relative %: 60.9 %
Platelets: 369 10*3/uL (ref 140–400)
RBC: 4.83 10*6/uL (ref 4.20–5.80)
RDW: 17.2 % — ABNORMAL HIGH (ref 11.0–15.0)
Total Lymphocyte: 26.3 %
WBC: 8.7 10*3/uL (ref 3.8–10.8)

## 2020-10-04 LAB — COMPLETE METABOLIC PANEL WITH GFR
AG Ratio: 1.4 (calc) (ref 1.0–2.5)
ALT: 14 U/L (ref 9–46)
AST: 22 U/L (ref 10–35)
Albumin: 4.3 g/dL (ref 3.6–5.1)
Alkaline phosphatase (APISO): 34 U/L — ABNORMAL LOW (ref 35–144)
BUN/Creatinine Ratio: 13 (calc) (ref 6–22)
BUN: 25 mg/dL (ref 7–25)
CO2: 26 mmol/L (ref 20–32)
Calcium: 9.9 mg/dL (ref 8.6–10.3)
Chloride: 102 mmol/L (ref 98–110)
Creat: 1.91 mg/dL — ABNORMAL HIGH (ref 0.70–1.28)
Globulin: 3.1 g/dL (calc) (ref 1.9–3.7)
Glucose, Bld: 86 mg/dL (ref 65–99)
Potassium: 4.9 mmol/L (ref 3.5–5.3)
Sodium: 137 mmol/L (ref 135–146)
Total Bilirubin: 0.5 mg/dL (ref 0.2–1.2)
Total Protein: 7.4 g/dL (ref 6.1–8.1)
eGFR: 36 mL/min/{1.73_m2} — ABNORMAL LOW (ref >=60)

## 2020-10-04 LAB — LIPID PANEL
Cholesterol: 195 mg/dL (ref <200)
HDL: 54 mg/dL (ref >=40)
LDL Cholesterol (Calc): 117 mg/dL (calc) — ABNORMAL HIGH
Non-HDL Cholesterol (Calc): 141 mg/dL (calc) — ABNORMAL HIGH (ref <130)
Total CHOL/HDL Ratio: 3.6 (calc) (ref <5.0)
Triglycerides: 126 mg/dL (ref <150)

## 2020-10-04 LAB — HEMOGLOBIN A1C
Hgb A1c MFr Bld: 6.1 % of total Hgb — ABNORMAL HIGH (ref <5.7)
Mean Plasma Glucose: 128 mg/dL
eAG (mmol/L): 7.1 mmol/L

## 2020-10-04 LAB — MAGNESIUM: Magnesium: 1.9 mg/dL (ref 1.5–2.5)

## 2020-10-04 LAB — TSH: TSH: 2.48 mIU/L (ref 0.40–4.50)

## 2020-10-04 LAB — PHOSPHORUS: Phosphorus: 3.8 mg/dL (ref 2.1–4.3)

## 2020-10-04 MED ORDER — EZETIMIBE 10 MG PO TABS: ORAL_TABLET | ORAL | 1 refills | Status: DC

## 2020-10-07 ENCOUNTER — Telehealth: Payer: Self-pay

## 2020-10-07 NOTE — Telephone Encounter (Signed)
Patient's wife states that the patient is "mixed up on Meds". Is told one thing by the doctor and then the NP tells him another thing.

## 2020-10-24 ENCOUNTER — Telehealth: Payer: Self-pay

## 2020-10-24 NOTE — Telephone Encounter (Signed)
I spoke to the patient regarding stopping taking ANY type of NSAIDs as advised prior to my phone call with him.  The patient has an upcoming appointment with Dr. Melford Aase; however, he is asking what made his kidney function change from February until his recent labs. The patient was advised that his labs would be rechecked at his next visit and that Dr. Melford Aase could answer his questions and work with him on his plan of care. The patient was insistent that he wanted the exact reason as to why his kidney function has changed and what his lab numbers are that reflects this change.

## 2020-10-28 ENCOUNTER — Other Ambulatory Visit: Payer: Self-pay | Admitting: Internal Medicine

## 2020-10-28 ENCOUNTER — Telehealth: Payer: Self-pay

## 2020-10-28 DIAGNOSIS — N289 Disorder of kidney and ureter, unspecified: Secondary | ICD-10-CM

## 2020-10-28 DIAGNOSIS — E1122 Type 2 diabetes mellitus with diabetic chronic kidney disease: Secondary | ICD-10-CM

## 2020-10-28 NOTE — Telephone Encounter (Signed)
I spoke to patient today and went over his labs from February 2022 explaining that those labs are in line with his current August 2022 labs and recommendations. The patient was made aware that a Nephrology referral would be placed and that he would be contacted by someone from their office once he was accepted in referral. I did explain to the patient that I could not pinpoint a date when he would be evaluated by a Nephrology practice. The patient repeated the plan back to me and voiced verbal understanding as to the plan of care at this time.

## 2020-10-29 ENCOUNTER — Other Ambulatory Visit: Payer: Self-pay | Admitting: Orthopedic Surgery

## 2020-10-29 ENCOUNTER — Other Ambulatory Visit (HOSPITAL_COMMUNITY): Payer: Self-pay | Admitting: Orthopedic Surgery

## 2020-10-29 DIAGNOSIS — M25551 Pain in right hip: Secondary | ICD-10-CM | POA: Diagnosis not present

## 2020-10-29 DIAGNOSIS — M25552 Pain in left hip: Secondary | ICD-10-CM

## 2020-10-31 DIAGNOSIS — M48062 Spinal stenosis, lumbar region with neurogenic claudication: Secondary | ICD-10-CM | POA: Diagnosis not present

## 2020-11-04 NOTE — Progress Notes (Signed)
   C  A  N  C  E  L  L  E  D                                                                                                      History of Present Illness:     Patient is a very nice 80 yo MWM  who had recent6 labs 1 month ago showing drop in GFR to 36 ( Stage 3 CKD)  with previous GFR's over the last year ranging from a low of 30  (July 2021)   to 40's to 64 over the last year.

## 2020-11-05 ENCOUNTER — Ambulatory Visit: Payer: Medicare Other | Admitting: Internal Medicine

## 2020-11-05 ENCOUNTER — Other Ambulatory Visit: Payer: Self-pay | Admitting: Internal Medicine

## 2020-11-05 DIAGNOSIS — N1832 Chronic kidney disease, stage 3b: Secondary | ICD-10-CM

## 2020-11-05 DIAGNOSIS — I10 Essential (primary) hypertension: Secondary | ICD-10-CM

## 2020-11-05 MED ORDER — PANTOPRAZOLE SODIUM 40 MG PO TBEC: DELAYED_RELEASE_TABLET | ORAL | 3 refills | Status: DC

## 2020-11-06 DIAGNOSIS — M48062 Spinal stenosis, lumbar region with neurogenic claudication: Secondary | ICD-10-CM | POA: Diagnosis not present

## 2020-11-07 ENCOUNTER — Other Ambulatory Visit: Payer: Self-pay | Admitting: Neurological Surgery

## 2020-11-07 ENCOUNTER — Ambulatory Visit (INDEPENDENT_AMBULATORY_CARE_PROVIDER_SITE_OTHER): Payer: Medicare Other

## 2020-11-07 ENCOUNTER — Other Ambulatory Visit: Payer: Self-pay

## 2020-11-07 DIAGNOSIS — Z79899 Other long term (current) drug therapy: Secondary | ICD-10-CM | POA: Diagnosis not present

## 2020-11-07 DIAGNOSIS — N289 Disorder of kidney and ureter, unspecified: Secondary | ICD-10-CM | POA: Diagnosis not present

## 2020-11-07 DIAGNOSIS — M48062 Spinal stenosis, lumbar region with neurogenic claudication: Secondary | ICD-10-CM

## 2020-11-07 LAB — COMPLETE METABOLIC PANEL WITH GFR
AG Ratio: 1.5 (calc) (ref 1.0–2.5)
ALT: 15 U/L (ref 9–46)
AST: 23 U/L (ref 10–35)
Albumin: 4.4 g/dL (ref 3.6–5.1)
Alkaline phosphatase (APISO): 40 U/L (ref 35–144)
BUN/Creatinine Ratio: 21 (calc) (ref 6–22)
BUN: 38 mg/dL — ABNORMAL HIGH (ref 7–25)
CO2: 27 mmol/L (ref 20–32)
Calcium: 10.1 mg/dL (ref 8.6–10.3)
Chloride: 98 mmol/L (ref 98–110)
Creat: 1.77 mg/dL — ABNORMAL HIGH (ref 0.70–1.28)
Globulin: 3 g/dL (calc) (ref 1.9–3.7)
Glucose, Bld: 129 mg/dL — ABNORMAL HIGH (ref 65–99)
Potassium: 5 mmol/L (ref 3.5–5.3)
Sodium: 134 mmol/L — ABNORMAL LOW (ref 135–146)
Total Bilirubin: 0.5 mg/dL (ref 0.2–1.2)
Total Protein: 7.4 g/dL (ref 6.1–8.1)
eGFR: 39 mL/min/{1.73_m2} — ABNORMAL LOW (ref >=60)

## 2020-11-07 NOTE — Progress Notes (Signed)
Patient presents to the office for a nurse visit to have labs done. Patient concerned about renal function and what stage he is in. No other questions or concerns. Vitals taken and recorded.

## 2020-11-08 NOTE — Progress Notes (Signed)
============================================================ -   Test results slightly outside the reference range are not unusual. If there is anything important, I will review this with you,  otherwise it is considered normal test values.  If you have further questions,  please do not hesitate to contact me at the office or via My Chart.  ============================================================ ============================================================  - Kidney functions still look a little dehydrated    Very important to drink adequate amounts of fluids to prevent permanent damage    - Recommend drink at least 6 bottles (16 ounces) of fluids /water /day = 96 Oz ~100 oz  - 100 oz = 3,000 cc or 3 liters / day  - >> That's 1 &1/2 bottles of a 2 liter soda bottle /day !  ============================================================ ============================================================  -  Creatinine is slightly better from 1.91 a month ago to 1.77 Now             Creatinine has been ranging between 1.2 to 1.6+ over the last 5 years !   - GFR - Kidney Flow or Filtering rate = 39 - still in Stage 3b - Chronic Kidney Disease   - So Kidney functions          GFR has ranged from 20 to 40's over the last 5 years   - So not an Emergency to see the Kidney doctors, but suggest follow up with the Kidney Doctor in the next 2-3 months to establish a plan for monitoring ============================================================ ============================================================  -  And AGAIN . . . .  P L E A S E  drink more  fluids                                                                           to prevent permanent damage   ============================================================ ============================================================  -

## 2020-11-11 ENCOUNTER — Encounter (HOSPITAL_COMMUNITY): Payer: Medicare Other

## 2020-11-11 ENCOUNTER — Telehealth: Payer: Self-pay

## 2020-11-11 ENCOUNTER — Ambulatory Visit (HOSPITAL_COMMUNITY): Payer: Medicare Other

## 2020-11-11 NOTE — Telephone Encounter (Signed)
I spoke to the patient's wife and went over lab values and recommendations. The patient's wife wrote down the information given and asked appropriate questions. She was also advised to please encourage the patient to keep any appointment made with the Kidney Specialist as those appointments are hard to come by at this time. The patient's wife voiced verbal understanding to this staff member.

## 2020-11-13 ENCOUNTER — Telehealth: Payer: Self-pay

## 2020-11-13 NOTE — Telephone Encounter (Signed)
Spoke to the patient's wife who informed clinic that the patient was unable to swallow water, food, medication, or or any liquid at this time per her telephone conversation with patient on 09/13/222. She informed clinic that the patient complained about the medication not working, but was unable to give me the medication name. The patient is in [place] on a Fishing/hunting trip at this time, so I was unable to speak with him directly. She reports that he called her at 10:00 am this morning, but she missed his call. She was advised per Dr. Melford Aase to inform the patient that he should go to his nearest Emergency Department for assessment and treatment. She was advised that due to his current kidney function issues and need for hydration he may need IV fluids. She was asked to contact this clinic with any further questions or concerns. The patient's wife voiced verbal understanding to this staff member.

## 2020-11-19 ENCOUNTER — Ambulatory Visit: Payer: Medicare Other | Admitting: Internal Medicine

## 2020-11-20 ENCOUNTER — Encounter (HOSPITAL_COMMUNITY)
Admission: RE | Admit: 2020-11-20 | Discharge: 2020-11-20 | Disposition: A | Discharge status: Home / Self Care | Payer: Medicare Other | Source: Ambulatory Visit | Attending: Orthopedic Surgery | Admitting: Orthopedic Surgery

## 2020-11-20 ENCOUNTER — Other Ambulatory Visit: Payer: Self-pay

## 2020-11-20 ENCOUNTER — Telehealth: Payer: Self-pay

## 2020-11-20 DIAGNOSIS — Z471 Aftercare following joint replacement surgery: Secondary | ICD-10-CM | POA: Diagnosis not present

## 2020-11-20 DIAGNOSIS — M25552 Pain in left hip: Secondary | ICD-10-CM | POA: Insufficient documentation

## 2020-11-20 DIAGNOSIS — R948 Abnormal results of function studies of other organs and systems: Secondary | ICD-10-CM | POA: Diagnosis not present

## 2020-11-20 DIAGNOSIS — Z96642 Presence of left artificial hip joint: Secondary | ICD-10-CM | POA: Diagnosis not present

## 2020-11-20 MED ORDER — TECHNETIUM TC 99M MEDRONATE IV KIT
20.0000 | PACK | Freq: Once | INTRAVENOUS | Status: AC | PRN
  Administered 2020-11-20: 20 via INTRAVENOUS

## 2020-11-20 NOTE — Telephone Encounter (Signed)
Spoke to the patient's wife who is requesting on behalf of the patient for him to be referred to a different GI doctor and to be referred to a doctor for evaluation of his worsening arthritis. The patient's wife was informed that once referrals are made those offices would be in touch for appointments and/or our referrals department if any questions need to be answered. The patient's wife was advised to call this clinic with any other questions or concerns and for the patient to seek emergency medical help for any worsening or concerning symptoms. She voiced verbal understanding to this staff member.

## 2020-11-25 DIAGNOSIS — H2512 Age-related nuclear cataract, left eye: Secondary | ICD-10-CM | POA: Diagnosis not present

## 2020-11-25 DIAGNOSIS — Z961 Presence of intraocular lens: Secondary | ICD-10-CM | POA: Diagnosis not present

## 2020-11-25 DIAGNOSIS — H353131 Nonexudative age-related macular degeneration, bilateral, early dry stage: Secondary | ICD-10-CM | POA: Diagnosis not present

## 2020-11-25 DIAGNOSIS — H26492 Other secondary cataract, left eye: Secondary | ICD-10-CM | POA: Diagnosis not present

## 2020-11-25 DIAGNOSIS — I10 Essential (primary) hypertension: Secondary | ICD-10-CM | POA: Diagnosis not present

## 2020-11-25 DIAGNOSIS — H26493 Other secondary cataract, bilateral: Secondary | ICD-10-CM | POA: Diagnosis not present

## 2020-11-28 NOTE — Addendum Note (Signed)
Addended by: Liane Comber C on: 11/28/2020 01:00 PM   Modules accepted: Orders

## 2020-12-03 ENCOUNTER — Other Ambulatory Visit: Payer: Self-pay

## 2020-12-03 ENCOUNTER — Ambulatory Visit
Admission: RE | Admit: 2020-12-03 | Discharge: 2020-12-03 | Disposition: A | Discharge status: Home / Self Care | Payer: Medicare Other | Source: Ambulatory Visit | Attending: Neurological Surgery | Admitting: Neurological Surgery

## 2020-12-03 DIAGNOSIS — M4319 Spondylolisthesis, multiple sites in spine: Secondary | ICD-10-CM | POA: Diagnosis not present

## 2020-12-03 DIAGNOSIS — M48061 Spinal stenosis, lumbar region without neurogenic claudication: Secondary | ICD-10-CM | POA: Diagnosis not present

## 2020-12-03 DIAGNOSIS — M48062 Spinal stenosis, lumbar region with neurogenic claudication: Secondary | ICD-10-CM

## 2020-12-03 DIAGNOSIS — M5126 Other intervertebral disc displacement, lumbar region: Secondary | ICD-10-CM | POA: Diagnosis not present

## 2020-12-03 DIAGNOSIS — M5136 Other intervertebral disc degeneration, lumbar region: Secondary | ICD-10-CM | POA: Diagnosis not present

## 2020-12-03 MED ORDER — GADOBENATE DIMEGLUMINE 529 MG/ML IV SOLN
20.0000 mL | Freq: Once | INTRAVENOUS | Status: AC | PRN
  Administered 2020-12-03: 20 mL via INTRAVENOUS

## 2020-12-04 DIAGNOSIS — M5126 Other intervertebral disc displacement, lumbar region: Secondary | ICD-10-CM | POA: Diagnosis not present

## 2020-12-05 DIAGNOSIS — I129 Hypertensive chronic kidney disease with stage 1 through stage 4 chronic kidney disease, or unspecified chronic kidney disease: Secondary | ICD-10-CM | POA: Diagnosis not present

## 2020-12-05 DIAGNOSIS — Z87442 Personal history of urinary calculi: Secondary | ICD-10-CM | POA: Diagnosis not present

## 2020-12-05 DIAGNOSIS — N189 Chronic kidney disease, unspecified: Secondary | ICD-10-CM | POA: Diagnosis not present

## 2020-12-05 DIAGNOSIS — N1832 Chronic kidney disease, stage 3b: Secondary | ICD-10-CM | POA: Diagnosis not present

## 2020-12-09 DIAGNOSIS — M5116 Intervertebral disc disorders with radiculopathy, lumbar region: Secondary | ICD-10-CM | POA: Diagnosis not present

## 2020-12-09 DIAGNOSIS — M5416 Radiculopathy, lumbar region: Secondary | ICD-10-CM | POA: Diagnosis not present

## 2020-12-12 ENCOUNTER — Telehealth: Payer: Self-pay | Admitting: Internal Medicine

## 2020-12-12 NOTE — Telephone Encounter (Signed)
Good morning Dr. Hilarie Fredrickson, we received a referral for patient to be seen for dysphagia.  He is a patient of Dr. Silverio Decamp but is requesting to transfer care to another provider.  Last year he did request a transfer as well with Dr. Fuller Plan; please refer to 10/31/2019 telephone note.  Can you please review and advise?  Thank you.

## 2020-12-18 DIAGNOSIS — H26491 Other secondary cataract, right eye: Secondary | ICD-10-CM | POA: Diagnosis not present

## 2020-12-20 NOTE — Telephone Encounter (Signed)
Ok for an appt Please explain that the wait may be lengthy for an appt due to high demand I understand dysphagia is a chronic symptom for him

## 2020-12-23 NOTE — Telephone Encounter (Signed)
Spoke with patient and scheduled appt with an APP for 01/08/21.

## 2020-12-31 DIAGNOSIS — M48062 Spinal stenosis, lumbar region with neurogenic claudication: Secondary | ICD-10-CM | POA: Diagnosis not present

## 2021-01-08 ENCOUNTER — Ambulatory Visit: Payer: Medicare Other | Admitting: Gastroenterology

## 2021-01-08 ENCOUNTER — Encounter: Payer: Self-pay | Admitting: Gastroenterology

## 2021-01-08 VITALS — BP 102/60 | HR 64 | Ht 71.0 in | Wt 238.6 lb

## 2021-01-08 DIAGNOSIS — Z860101 Personal history of adenomatous and serrated colon polyps: Secondary | ICD-10-CM

## 2021-01-08 DIAGNOSIS — R1310 Dysphagia, unspecified: Secondary | ICD-10-CM | POA: Diagnosis not present

## 2021-01-08 DIAGNOSIS — Z86010 Personal history of colonic polyps: Secondary | ICD-10-CM | POA: Diagnosis not present

## 2021-01-08 MED ORDER — SUTAB 1479-225-188 MG PO TABS: 1.0000 | ORAL_TABLET | ORAL | 0 refills | Status: DC

## 2021-01-08 NOTE — Patient Instructions (Signed)
PROCEDURES: You have been scheduled for an EGD and Colonoscopy. Please follow the written instructions given to you at your visit today. Please pick up your prep supplies at the pharmacy within the next 1-3 days. If you use inhalers (even only as needed), please bring them with you on the day of your procedure.  You have been scheduled for an esophageal manometry at [facility] on 04/09/21 at 12:30. Please arrive 30 minutes prior to your procedure for registration. You will need to go to outpatient registration (1st floor of the hospital) first. Make certain to bring your insurance cards as well as a complete list of medications.  Please remember the following:  1) Do not take any muscle relaxants, xanax (alprazolam) or ativan for 1 day prior to your test as well as the day of the test.  2) Nothing to eat or drink for 4 hours before your test.  3) Hold all diabetic medications/insulin the morning of the test. You may eat and take your medications after the test.  It will take at least 2 weeks to receive the results of this test from your physician. ------------------------------------------ ABOUT ESOPHAGEAL MANOMETRY Esophageal manometry (muh-NOM-uh-tree) is a test that gauges how well your esophagus works. Your esophagus is the long, muscular tube that connects your throat to your stomach. Esophageal manometry measures the rhythmic muscle contractions (peristalsis) that occur in your esophagus when you swallow. Esophageal manometry also measures the coordination and force exerted by the muscles of your esophagus.  During esophageal manometry, a thin, flexible tube (catheter) that contains sensors is passed through your nose, down your esophagus and into your stomach. Esophageal manometry can be helpful in diagnosing some mostly uncommon disorders that affect your esophagus.  Why it's done Esophageal manometry is used to evaluate the movement (motility) of food through the esophagus and  into the stomach. The test measures how well the circular bands of muscle (sphincters) at the top and bottom of your esophagus open and close, as well as the pressure, strength and pattern of the wave of esophageal muscle contractions that moves food along.  What you can expect Esophageal manometry is an outpatient procedure done without sedation. Most people tolerate it well. You may be asked to change into a hospital gown before the test starts.  During esophageal manometry  While you are sitting up, a member of your health care team sprays your throat with a numbing medication or puts numbing gel in your nose or both.  A catheter is guided through your nose into your esophagus. The catheter may be sheathed in a water-filled sleeve. It doesn't interfere with your breathing. However, your eyes may water, and you may gag. You may have a slight nosebleed from irritation.  After the catheter is in place, you may be asked to lie on your back on an exam table, or you may be asked to remain seated.  You then swallow small sips of water. As you do, a computer connected to the catheter records the pressure, strength and pattern of your esophageal muscle contractions.  During the test, you'll be asked to breathe slowly and smoothly, remain as still as possible, and swallow only when you're asked to do so.  A member of your health care team may move the catheter down into your stomach while the catheter continues its measurements.  The catheter then is slowly withdrawn. The test usually lasts 20 to 30 minutes.  After esophageal manometry  When your esophageal manometry is complete, you  may return to your normal activities  This test typically takes 30-45 minutes to complete. ________________________________________________________________________________  It was great seeing you today! Thank you for entrusting me with your care and choosing [facility].  Alonza Bogus, PA-C  If you are age 54 or  older, your body mass index should be between 23-30. Your Body mass index is 33.28 kg/m. If this is out of the aforementioned range listed, please consider follow up with your Primary Care Provider.  If you are age 44 or younger, your body mass index should be between 19-25. Your Body mass index is 33.28 kg/m. If this is out of the aformentioned range listed, please consider follow up with your Primary Care Provider.   The [facility] providers would like to encourage you to use [facility] to communicate with providers for non-urgent requests or questions.  Due to long hold times on the telephone, sending your provider a message by [facility] may be faster and more efficient way to get a response. Please allow 48 business hours for a response.  Please remember that this is for non-urgent requests/questions.

## 2021-01-08 NOTE — Progress Notes (Signed)
01/08/2021 [name] [name] [id number] 1940/05/15   HISTORY OF PRESENT ILLNESS: This is a 80 year old male who was remotely patient of Dr. Kelby Fam then when Dr. [name] left the practice he transitioned to Dr. Silverio Decamp.  Now he had requested to switch to Dr. Hilarie Fredrickson and Dr. Hilarie Fredrickson has accepted.  The patient follows here for surveillance colonoscopies and for complaints of dysphagia.  His dysphagia is his main issue.  He has experienced this intermittently over the years.  EGD of late has been really unremarkable for the cause of his dysphagia.  Dilation gives him only temporary improvement in his symptoms.  Dysphagia tends to occur more so if he eats or drinks something very cold.  He denies having any pain when this occurs.  This was thought to be esophageal spasms.  Esophageal manometry was recommended but he has not yet had that performed.  He has tried Levsin in the past, but reports that it gave him hallucinations.  He is asking about another EGD and dilation.  In regards to colonoscopy, he has history of adenomatous colon polyps.  He reports issues with constipation due to taking pain medications, but takes magnesium supplements daily and those help him to move his bowels.   EGD 11/2018 showed the following:  - No endoscopic esophageal abnormality to explain patient's dysphagia. Esophagus dilated. Dilated. - Z-line regular, 40 cm from the incisors. - Normal esophagus. Biopsied. - Gastritis. Biopsied. - Normal examined duodenum.  Colonoscopy 01/2017 showed the following:  - Two 8 to 14 mm polyps in the ascending colon and in the cecum, removed piecemeal using a cold snare. Resected and retrieved. - One 2 mm polyp in the transverse colon, removed with a cold biopsy forceps. Resected and retrieved. - One 6 mm polyp in the sigmoid colon, removed with a cold snare. Resected and retrieved. - Diverticulosis in the sigmoid colon and in the descending colon. - Non-bleeding internal  hemorrhoids.  1. Surgical [P], ascending and cecum, polyp (2) - TUBULAR ADENOMA (FOUR FRAGMENTS). - NO HIGH GRADE DYSPLASIA OR MALIGNANCY. 2. Surgical [P], descending and sigmoid, polyp (2) - HYPERPLASTIC POLYP (ONE FRAGMENT). - BENIGN LYMPHOID POLYP (ONE FRAGMENT). - NO ADENOMATOUS CHANGE OR MALIGNANCY.   Past Medical History:  Diagnosis Date   Arthritis    in left hip   ED (erectile dysfunction)    Enlarged prostate with lower urinary tract symptoms (LUTS)    GERD (gastroesophageal reflux disease)    History of chronic gastritis    History of colon polyps    hyperplastic 2006   History of esophageal stricture    S/P  DILATATION 2009; 2010; 2010 2012   History of kidney stones    hx. multiple kidney stones   History of kidney stones    History of primary hyperparathyroidism    s/p  left superior parathyroidectomy 07-10-2014   Hyperlipidemia    Hypertension    Ileus ([place]) 05/02/2017   Left ureteral stone    Pre-diabetes    Sepsis secondary to UTI ([place]) 05/02/2017   Stricture and stenosis of esophagus 10/18/2008   Past Surgical History:  Procedure Laterality Date   BACK SURGERY  04/2018   fusion L2 and L3   CATARACT EXTRACTION Bilateral 2022   Dr. Almira Coaster   COLONOSCOPY  02/17/2005   CYSTO/  LEFT RETROGRADE PYELOGRAM/ STENT PLACEMENT  01/26/2005   CYSTOSCOPY W/ URETEROSCOPY W/ LITHOTRIPSY Left 05/04/2005   CYSTOSCOPY WITH STENT PLACEMENT Left 12/30/2015   Procedure: CYSTOSCOPY WITH STENT PLACEMENT;  Surgeon: Franchot Gallo, MD;  Location: [facility];  Service: Urology;  Laterality: Left;   CYSTOSCOPY/RETROGRADE/URETEROSCOPY/STONE EXTRACTION WITH BASKET Left 12/30/2015   Procedure: CYSTOSCOPY/RETROGRADE/URETEROSCOPY/STONE EXTRACTION WITH BASKET;  Surgeon: Franchot Gallo, MD;  Location[facility];  Service: Urology;  Laterality: Left;   CYSTOSCOPY/URETEROSCOPY/HOLMIUM LASER/STENT PLACEMENT Left 03/09/2016   Procedure:  CYSTOSCOPY/RETROGRADE PYELOGRAM/URETEROSCOPY/BASKET STONE EXTRACTION/STENT PLACEMENT;  Surgeon: Franchot Gallo, MD;  Location: [facility];  Service: Urology;  Laterality: Left;   ESOPHAGOGASTRODUODENOSCOPY (EGD) WITH ESOPHAGEAL DILATION  x4  last one 07-21-2010   HOLMIUM LASER APPLICATION Left 62/95/2841   Procedure: HOLMIUM LASER APPLICATION;  Surgeon: Franchot Gallo, MD;  Location: [facility];  Service: Urology;  Laterality: Left;   INGUINAL HERNIA REPAIR Left yrs ago   LUMBAR LAMINECTOMY/DECOMPRESSION MICRODISCECTOMY Left 11/09/2017   Procedure: Left Lumbar Two-Three Microdiscectomy;  Surgeon: Kristeen Miss, MD;  Location: [place];  Service: Neurosurgery;  Laterality: Left;  Left Lumbar Two-Three Microdiscectomy   NEPHROLITHOTOMY Left 02/01/2014   Procedure: NEPHROLITHOTOMY PERCUTANEOUS;  Surgeon: Jorja Loa, MD;  Location: [facility];  Service: Urology;  Laterality: Left;   ORIF HIP FRACTURE Left 04/16/2017   Procedure: OPEN REDUCTION INTERNAL FIXATION HIP GREATER TROCHANTER;  Surgeon: Frederik Pear, MD;  Location: [place];  Service: Orthopedics;  Laterality: Left;   PARATHYROIDECTOMY Left 07/10/2014   Procedure: LEFT SUPERIOR PARATHYROIDECTOMY;  Surgeon: Armandina Gemma, MD;  Location: [place];  Service: General;  Laterality: Left;   POSTERIOR LUMBAR FUSION  09/07/2019   TOTAL HIP ARTHROPLASTY Left 06/24/2016   Procedure: LEFT TOTAL HIP ARTHROPLASTY ANTERIOR APPROACH;  Surgeon: Gaynelle Arabian, MD;  Location: [facility];  Service: Orthopedics;  Laterality: Left;   TOTAL HIP REVISION Left 11/18/2016   Procedure: Left femoral revision - posterior approach;  Surgeon: Gaynelle Arabian, MD;  Location: [facility];  Service: Orthopedics;  Laterality: Left;   tumor ear Left age 69    topical growth behind left ear.    reports that he quit smoking about 46 years ago. His smoking use included cigarettes. He has never used smokeless tobacco. He reports current alcohol use of about 1.0 - 2.0 standard  drink per week. He reports that he does not use drugs. family history includes Alzheimer's disease in his mother; Bladder Cancer in his sister; Diabetes in his sister; Heart disease in his father and mother. Allergies  Allergen Reactions   Iodinated Diagnostic Agents Shortness Of Breath   Iohexol Shortness Of Breath   Statins     Myalgia, with several per patient      Outpatient Encounter Medications as of 01/08/2021  Medication Sig   aspirin EC 81 MG tablet Restart on 12/11/2019   b complex vitamins tablet Take 1 tablet by mouth daily.   bisoprolol-hydrochlorothiazide (ZIAC) 5-6.25 MG tablet Take 1 tablet by mouth in the morning. Take 1 tablet Daily for BP   cholecalciferol (VITAMIN D3) 25 MCG (1000 UNIT) tablet Take 1,000 Units by mouth daily.   fenofibrate (TRICOR) 145 MG tablet TAKE 1 TABLET DAILY FOR TRIGLYCERIDES (BLOOD FATS)   finasteride (PROSCAR) 5 MG tablet Take 5 mg by mouth daily.   Flaxseed, Linseed, (FLAXSEED OIL) 1200 MG CAPS Take 1 capsule by mouth daily.   gabapentin (NEURONTIN) 300 MG capsule Take 300 mg by mouth 2 (two) times daily.   losartan (COZAAR) 100 MG tablet TAKE 1 TABLET BY MOUTH DAILY FOR BLOOD PRESSURE   magnesium oxide (MAG-OX) 400 MG tablet Take 400 mg by mouth daily.   Omega-3 Fatty Acids (FISH  OIL) 1200 MG CAPS Restart on 10.11   pantoprazole (PROTONIX) 40 MG tablet Take 1 tablet Daily for Indigestion & Acid Reflux   tamsulosin (FLOMAX) 0.4 MG CAPS capsule Take 0.4 mg by mouth daily after breakfast.    sildenafil (VIAGRA) 100 MG tablet Take 100 mg by mouth daily as needed for erectile dysfunction. (Patient not taking: Reported on 01/08/2021)   [DISCONTINUED] acetaminophen (TYLENOL) 500 MG tablet Take 500-1,000 mg by mouth 3 (three) times daily as needed.   [DISCONTINUED] cetirizine (ZYRTEC ALLERGY) 10 MG tablet Take 1 tablet (10 mg total) by mouth daily.   [DISCONTINUED] Cholecalciferol (VITAMIN D) 125 MCG (5000 UT) CAPS Take 5,000 Units by mouth daily.    [DISCONTINUED] ezetimibe (ZETIA) 10 MG tablet Take 1 tab daily for cholesterol.   [DISCONTINUED] Flaxseed, Linseed, (FLAX SEEDS PO) Take 1,000 mg by mouth daily.    [DISCONTINUED] Magnesium Oxide 250 MG TABS Take 250 mg by mouth 2 (two) times daily.    [DISCONTINUED] montelukast (SINGULAIR) 10 MG tablet Take 1 tablet daily for Allergies & Asthma   [DISCONTINUED] vitamin B-12 (CYANOCOBALAMIN) 1000 MCG tablet Take 1 tablet (1,000 mcg total) by mouth daily.   No facility-administered encounter medications on file as of 01/08/2021.     REVIEW OF SYSTEMS  : All other systems reviewed and negative except where noted in the History of Present Illness.   PHYSICAL EXAM: BP 102/60   Pulse 64   Ht 5\' 11"  (1.803 m)   Wt 238 lb 9.6 oz (108.2 kg)   BMI 33.28 kg/m  General: Well developed white male in no acute distress; ambulating with a cane Head: Normocephalic and atraumatic Eyes:  Sclerae anicteric, conjunctiva pink. Ears: Normal auditory acuity Lungs: Clear throughout to auscultation; no W/R/R. Heart: Regular rate and rhythm; no M/R/G. Abdomen: Soft, non-distended.  BS present.  Non-tender. Rectal:  Will be done at the time of colonoscopy. Musculoskeletal: Symmetrical with no gross deformities  Skin: No lesions on visible extremities Extremities: No edema  Neurological: Alert oriented x 4, grossly non-focal Psychological:  Alert and cooperative. Normal mood and affect  ASSESSMENT AND PLAN: *Personal history of adenomatous polyps removed from the ascending colon and cecum that were 8 to 14 mm in size and were tubular adenomas on pathology.  Repeat colonoscopy was recommended a 3-year interval.  We will schedule this with Dr. Hilarie Fredrickson. *Dysphagia: Has been experiencing dysphagia intermittently over the years.  This occurs particularly if he eats or drinks something very cold.  Thought to be esophageal spasms.  EGD with dilation provides only very temporary relief.  Has been recommended  previously that have esophageal manometry, but that has never been performed.  Tried Levsin in the past, but reports it gave him hallucinations.  Since he is due for colonoscopy we will go ahead and repeat EGD with dilation 1 more time.  I also am going to schedule him for esophageal manometry as well as that is scheduling out into February currently.  I think we should go ahead and get that on the books.  **The risks, benefits, and alternatives to EGD with dilation and colonoscopy were discussed with the patient and he consents to proceed.   CC:  Unk Pinto, MD

## 2021-01-13 DIAGNOSIS — D539 Nutritional anemia, unspecified: Secondary | ICD-10-CM | POA: Diagnosis not present

## 2021-01-13 NOTE — Progress Notes (Signed)
Addendum: Reviewed and agree with assessment and management plan. Garren Greenman M, MD  

## 2021-02-12 ENCOUNTER — Ambulatory Visit: Payer: Medicare Other | Admitting: Nurse Practitioner

## 2021-02-13 ENCOUNTER — Other Ambulatory Visit: Payer: Self-pay | Admitting: Internal Medicine

## 2021-03-06 ENCOUNTER — Ambulatory Visit (AMBULATORY_SURGERY_CENTER): Payer: Medicare Other | Admitting: Internal Medicine

## 2021-03-06 ENCOUNTER — Encounter: Payer: Self-pay | Admitting: Internal Medicine

## 2021-03-06 ENCOUNTER — Other Ambulatory Visit: Payer: Self-pay

## 2021-03-06 VITALS — BP 140/87 | HR 60 | Temp 98.6°F | Resp 21 | Ht 71.0 in | Wt 238.0 lb

## 2021-03-06 DIAGNOSIS — K219 Gastro-esophageal reflux disease without esophagitis: Secondary | ICD-10-CM

## 2021-03-06 DIAGNOSIS — K2970 Gastritis, unspecified, without bleeding: Secondary | ICD-10-CM | POA: Diagnosis not present

## 2021-03-06 DIAGNOSIS — K31819 Angiodysplasia of stomach and duodenum without bleeding: Secondary | ICD-10-CM

## 2021-03-06 DIAGNOSIS — R7303 Prediabetes: Secondary | ICD-10-CM | POA: Diagnosis not present

## 2021-03-06 DIAGNOSIS — D124 Benign neoplasm of descending colon: Secondary | ICD-10-CM | POA: Diagnosis not present

## 2021-03-06 DIAGNOSIS — R1310 Dysphagia, unspecified: Secondary | ICD-10-CM

## 2021-03-06 DIAGNOSIS — D122 Benign neoplasm of ascending colon: Secondary | ICD-10-CM | POA: Diagnosis not present

## 2021-03-06 DIAGNOSIS — D123 Benign neoplasm of transverse colon: Secondary | ICD-10-CM

## 2021-03-06 DIAGNOSIS — K319 Disease of stomach and duodenum, unspecified: Secondary | ICD-10-CM | POA: Diagnosis not present

## 2021-03-06 DIAGNOSIS — Z86010 Personal history of colonic polyps: Secondary | ICD-10-CM | POA: Diagnosis not present

## 2021-03-06 DIAGNOSIS — I10 Essential (primary) hypertension: Secondary | ICD-10-CM | POA: Diagnosis not present

## 2021-03-06 MED ORDER — SODIUM CHLORIDE 0.9 % IV SOLN: 500.0000 mL | INTRAVENOUS | Status: DC

## 2021-03-06 NOTE — Progress Notes (Signed)
No problems noted in the recovery room. maw 

## 2021-03-06 NOTE — Progress Notes (Signed)
Pt's states no medical or surgical changes since previsit or office visit. 

## 2021-03-06 NOTE — Progress Notes (Signed)
Pt non-responsive, VVS, Report to RN  °

## 2021-03-06 NOTE — Op Note (Signed)
[place] Patient Name: [name] [name] Procedure Date: 03/06/2021 2:56 PM MRN: [id number] Endoscopist: Jerene Bears , MD Age: 81 Referring MD:  Date of Birth: [birth year] Gender: Male Account #: [id number] Procedure:                Upper GI endoscopy Indications:              Dysphagia (solid and liquid), previous esophageal                            dilations not always helpful to control this                            intermittent symptom Medicines:                Monitored Anesthesia Care Procedure:                Pre-Anesthesia Assessment:                           - Prior to the procedure, a History and Physical                            was performed, and patient medications and                            allergies were reviewed. The patient's tolerance of                            previous anesthesia was also reviewed. The risks                            and benefits of the procedure and the sedation                            options and risks were discussed with the patient.                            All questions were answered, and informed consent                            was obtained. Prior Anticoagulants: The patient has                            taken no previous anticoagulant or antiplatelet                            agents. ASA Grade Assessment: III - A patient with                            severe systemic disease. After reviewing the risks                            and benefits, the patient was deemed in  satisfactory condition to undergo the procedure.                           After obtaining informed consent, the endoscope was                            passed under direct vision. Throughout the                            procedure, the patient's blood pressure, pulse, and                            oxygen saturations were monitored continuously. The                            Endoscope was introduced through the mouth,  and                            advanced to the second part of duodenum. The upper                            GI endoscopy was accomplished without difficulty.                            The patient tolerated the procedure well. Scope In: Scope Out: Findings:                 Scattered white nummular lesions were noted in the                            upper third of the esophagus and in the middle                            third of the esophagus. Biopsies were taken with a                            cold forceps for histology to exclude Candida                            esophagitis.                           No endoscopic abnormality was evident in the                            esophagus to explain the patient's complaint of                            dysphagia. It was decided, however, to proceed with                            dilation of the entire esophagus. The scope was  withdrawn. Dilation was performed with a Maloney                            dilator with mild resistance at 18 Fr.                           Moderate inflammation characterized by congestion                            (edema), erosions and erythema was found in the                            gastric antrum. Biopsies were taken with a cold                            forceps for histology and Helicobacter pylori                            testing.                           Two small angioectasias with typical arborization                            were found in the duodenal bulb and in the second                            portion of the duodenum.                           The exam of the duodenum was otherwise normal. Complications:            No immediate complications. Estimated Blood Loss:     Estimated blood loss was minimal. Impression:               - White nummular lesions in esophageal mucosa.                            Biopsied to exclude Candida esophagitis.                            - No endoscopic esophageal abnormality to explain                            patient's dysphagia. Esophagus dilated with 54 Fr.                            Maloney.                           - Gastritis. Biopsied.                           - Two angioectasias in the duodenum. Recommendation:           - Patient has a contact number available for  emergencies. The signs and symptoms of potential                            delayed complications were discussed with the                            patient. Return to normal activities tomorrow.                            Written discharge instructions were provided to the                            patient.                           - Post-dilation diet and then resume previous diet.                           - Continue present medications.                           - Await pathology results.                           - Keep esophageal manometry as scheduled (however                            if symptoms resolves after dilation and possible                            future treatment for yeast esophagitis this can be                            canceled). Moderate suspicion for esophageal                            dysmotility/spasm. Jerene Bears, MD 03/06/2021 3:41:42 PM This report has been signed electronically.

## 2021-03-06 NOTE — Progress Notes (Signed)
GASTROENTEROLOGY PROCEDURE H&P NOTE   Primary Care Physician: Unk Pinto, MD    Reason for Procedure:  Dysphagia, history of colon polyps  Plan:    EGD with probable dilation and colonoscopy  Patient is appropriate for endoscopic procedure(s) in the ambulatory ([facility]) setting.  The nature of the procedure, as well as the risks, benefits, and alternatives were carefully and thoroughly reviewed with the patient. Ample time for discussion and questions allowed. The patient understood, was satisfied, and agreed to proceed.     HPI: [name] [name] is a 81 y.o. male who presents for upper endoscopy with possible dilation, colonoscopy.  Tolerated the prep.  No recent chest pain or shortness of breath.  Medical history as below.  See note dated 01/08/2021 for details regarding recent symptoms.  No change in medical history since that time.  Past Medical History:  Diagnosis Date   Arthritis    in left hip   ED (erectile dysfunction)    Enlarged prostate with lower urinary tract symptoms (LUTS)    GERD (gastroesophageal reflux disease)    History of chronic gastritis    History of colon polyps    hyperplastic 2006   History of esophageal stricture    S/P  DILATATION 2009; 2010; 2010 2012   History of kidney stones    hx. multiple kidney stones   History of kidney stones    History of primary hyperparathyroidism    s/p  left superior parathyroidectomy 07-10-2014   Hyperlipidemia    Hypertension    Ileus ([place]) 05/02/2017   Left ureteral stone    Pre-diabetes    Sepsis secondary to UTI ([place]) 05/02/2017   Stricture and stenosis of esophagus 10/18/2008    Past Surgical History:  Procedure Laterality Date   BACK SURGERY  04/2018   fusion L2 and L3   CATARACT EXTRACTION Bilateral 2022   Dr. Almira Coaster   COLONOSCOPY  02/17/2005   CYSTO/  LEFT RETROGRADE PYELOGRAM/ STENT PLACEMENT  01/26/2005   CYSTOSCOPY W/ URETEROSCOPY W/ LITHOTRIPSY Left 05/04/2005   CYSTOSCOPY WITH STENT  PLACEMENT Left 12/30/2015   Procedure: CYSTOSCOPY WITH STENT PLACEMENT;  Surgeon: Franchot Gallo, MD;  Location[facility];  Service: Urology;  Laterality: Left;   CYSTOSCOPY/RETROGRADE/URETEROSCOPY/STONE EXTRACTION WITH BASKET Left 12/30/2015   Procedure: CYSTOSCOPY/RETROGRADE/URETEROSCOPY/STONE EXTRACTION WITH BASKET;  Surgeon: Franchot Gallo, MD;  Location: [facility];  Service: Urology;  Laterality: Left;   CYSTOSCOPY/URETEROSCOPY/HOLMIUM LASER/STENT PLACEMENT Left 03/09/2016   Procedure: CYSTOSCOPY/RETROGRADE PYELOGRAM/URETEROSCOPY/BASKET STONE EXTRACTION/STENT PLACEMENT;  Surgeon: Franchot Gallo, MD;  Location: [facility];  Service: Urology;  Laterality: Left;   ESOPHAGOGASTRODUODENOSCOPY (EGD) WITH ESOPHAGEAL DILATION  x4  last one 07-21-2010   HOLMIUM LASER APPLICATION Left 36/14/4315   Procedure: HOLMIUM LASER APPLICATION;  Surgeon: Franchot Gallo, MD;  Location: [facility];  Service: Urology;  Laterality: Left;   INGUINAL HERNIA REPAIR Left yrs ago   LUMBAR LAMINECTOMY/DECOMPRESSION MICRODISCECTOMY Left 11/09/2017   Procedure: Left Lumbar Two-Three Microdiscectomy;  Surgeon: Kristeen Miss, MD;  Location: [place];  Service: Neurosurgery;  Laterality: Left;  Left Lumbar Two-Three Microdiscectomy   NEPHROLITHOTOMY Left 02/01/2014   Procedure: NEPHROLITHOTOMY PERCUTANEOUS;  Surgeon: Jorja Loa, MD;  Location: [facility];  Service: Urology;  Laterality: Left;   ORIF HIP FRACTURE Left 04/16/2017   Procedure: OPEN REDUCTION INTERNAL FIXATION HIP GREATER TROCHANTER;  Surgeon: Frederik Pear, MD;  Location: [place];  Service: Orthopedics;  Laterality: Left;   PARATHYROIDECTOMY Left 07/10/2014   Procedure: LEFT SUPERIOR PARATHYROIDECTOMY;  Surgeon: Armandina Gemma, MD;  Location: [place];  Service: General;  Laterality: Left;   POSTERIOR LUMBAR FUSION  09/07/2019   TOTAL HIP ARTHROPLASTY Left 06/24/2016   Procedure: LEFT TOTAL HIP ARTHROPLASTY  ANTERIOR APPROACH;  Surgeon: Gaynelle Arabian, MD;  Location: [facility];  Service: Orthopedics;  Laterality: Left;   TOTAL HIP REVISION Left 11/18/2016   Procedure: Left femoral revision - posterior approach;  Surgeon: Gaynelle Arabian, MD;  Location: [facility];  Service: Orthopedics;  Laterality: Left;   tumor ear Left age 86    topical growth behind left ear.    Prior to Admission medications   Medication Sig Start Date End Date Taking? Authorizing Provider  aspirin EC 81 MG tablet Restart on 12/11/2019 12/07/19  Yes Osie Cheeks, NP  b complex vitamins tablet Take 1 tablet by mouth daily.   Yes [provider]  bisoprolol-hydrochlorothiazide (ZIAC) 5-6.25 MG tablet Take 1 tablet by mouth in the morning. Take 1 tablet Daily for BP 09/12/20  Yes Corbett, Caryl Pina, NP  cholecalciferol (VITAMIN D3) 25 MCG (1000 UNIT) tablet Take 1,000 Units by mouth daily.   Yes [provider]  fenofibrate (TRICOR) 145 MG tablet TAKE 1 TABLET DAILY FOR TRIGLYCERIDES (BLOOD FATS) 02/13/21  Yes Unk Pinto, MD  gabapentin (NEURONTIN) 300 MG capsule Take 300 mg by mouth 2 (two) times daily. 11/28/19  Yes [provider]  losartan (COZAAR) 100 MG tablet TAKE 1 TABLET BY MOUTH DAILY FOR BLOOD PRESSURE 07/17/20  Yes Liane Comber, NP  magnesium oxide (MAG-OX) 400 MG tablet Take 400 mg by mouth daily.   Yes [provider]  Omega-3 Fatty Acids (FISH OIL) 1200 MG CAPS Restart on 10.11 12/07/19  Yes Osie Cheeks, NP  pantoprazole (PROTONIX) 40 MG tablet Take 1 tablet Daily for Indigestion & Acid Reflux 11/05/20  Yes Unk Pinto, MD  tamsulosin [facility]) 0.4 MG CAPS capsule Take 0.4 mg by mouth daily after breakfast.  11/06/14  Yes [provider]  finasteride (PROSCAR) 5 MG tablet Take 5 mg by mouth daily.    [provider]  Flaxseed, Linseed, (FLAXSEED OIL) 1200 MG CAPS Take 1 capsule by mouth daily.    [provider]  sildenafil (VIAGRA) 100 MG tablet Take 100 mg by  mouth daily as needed for erectile dysfunction. Patient not taking: Reported on 01/08/2021 08/18/19   [provider]    Current Outpatient Medications  Medication Sig Dispense Refill   aspirin EC 81 MG tablet Restart on 12/11/2019 1 tablet 0   b complex vitamins tablet Take 1 tablet by mouth daily.     bisoprolol-hydrochlorothiazide (ZIAC) 5-6.25 MG tablet Take 1 tablet by mouth in the morning. Take 1 tablet Daily for BP 90 tablet 1   cholecalciferol (VITAMIN D3) 25 MCG (1000 UNIT) tablet Take 1,000 Units by mouth daily.     fenofibrate (TRICOR) 145 MG tablet TAKE 1 TABLET DAILY FOR TRIGLYCERIDES (BLOOD FATS) 90 tablet 3   gabapentin (NEURONTIN) 300 MG capsule Take 300 mg by mouth 2 (two) times daily.     losartan (COZAAR) 100 MG tablet TAKE 1 TABLET BY MOUTH DAILY FOR BLOOD PRESSURE 90 tablet 3   magnesium oxide (MAG-OX) 400 MG tablet Take 400 mg by mouth daily.     Omega-3 Fatty Acids (FISH OIL) 1200 MG CAPS Restart on 10.11 1 capsule 0   pantoprazole (PROTONIX) 40 MG tablet Take 1 tablet Daily for Indigestion & Acid Reflux 90 tablet 3   tamsulosin (FLOMAX) 0.4 MG CAPS capsule  Take 0.4 mg by mouth daily after breakfast.   3   finasteride (PROSCAR) 5 MG tablet Take 5 mg by mouth daily.     Flaxseed, Linseed, (FLAXSEED OIL) 1200 MG CAPS Take 1 capsule by mouth daily.     sildenafil (VIAGRA) 100 MG tablet Take 100 mg by mouth daily as needed for erectile dysfunction. (Patient not taking: Reported on 01/08/2021)     Current Facility-Administered Medications  Medication Dose Route Frequency Provider Last Rate Last Admin   0.9 %  sodium chloride infusion  500 mL Intravenous Continuous Daiveon Markman, Lajuan Lines, MD        Allergies as of 03/06/2021 - Review Complete 03/06/2021  Allergen Reaction Noted   Iodinated contrast media Shortness Of Breath    Iohexol Shortness Of Breath 07/11/2010   Statins  10/03/2020    Family History  Problem Relation Age of Onset   Heart disease Mother     Alzheimer's disease Mother    Heart disease Father    Bladder Cancer Sister        ??   Diabetes Sister    Colon cancer Neg Hx    Esophageal cancer Neg Hx    Stomach cancer Neg Hx    Rectal cancer Neg Hx     Social History   Socioeconomic History   Marital status: Married    Spouse name: Not on file   Number of children: 2   Years of education: Not on file   Highest education level: Not on file  Occupational History   Occupation: real estate business  Tobacco Use   Smoking status: Former    Years: 8.00    Types: Cigarettes    Quit date: 03/02/1974    Years since quitting: 47.0   Smokeless tobacco: Never  Vaping Use   Vaping Use: Never used  Substance and Sexual Activity   Alcohol use: Yes    Alcohol/week: 1.0 - 2.0 standard drink    Types: 1 - 2 Glasses of wine per week    Comment: occasional   Drug use: No   Sexual activity: Yes  Other Topics Concern   Not on file  Social History Narrative   Not on file   Social Determinants of Health   Financial Resource Strain: Not on file  Food Insecurity: Not on file  Transportation Needs: Not on file  Physical Activity: Not on file  Stress: Not on file  Social Connections: Not on file  Intimate Partner Violence: Not on file    Physical Exam: Vital signs in last 24 hours: @BP  (!) 182/94    Pulse 61    Temp 98.6 F (37 C) (Temporal)    Ht 5\' 11"  (1.803 m)    Wt 238 lb (108 kg)    SpO2 99%    BMI 33.19 kg/m  GEN: NAD EYE: Sclerae anicteric ENT: MMM CV: Non-tachycardic Pulm: CTA b/l GI: Soft, NT/ND NEURO:  Alert & Oriented x 3   Zenovia Jarred, MD [facility]  03/06/2021 2:51 PM

## 2021-03-06 NOTE — Op Note (Signed)
[place] Patient Name: [name] [name] Procedure Date: 03/06/2021 2:55 PM MRN: [id number] Endoscopist: Jerene Bears , MD Age: 81 Referring MD:  Date of Birth: Jul 14, 1940 Gender: Male Account #: [id number] Procedure:                Colonoscopy Indications:              High risk colon cancer surveillance: Personal                            history of multiple (3 or more) adenomas, Last                            colonoscopy: December 2018 Medicines:                Monitored Anesthesia Care Procedure:                Pre-Anesthesia Assessment:                           - Prior to the procedure, a History and Physical                            was performed, and patient medications and                            allergies were reviewed. The patient's tolerance of                            previous anesthesia was also reviewed. The risks                            and benefits of the procedure and the sedation                            options and risks were discussed with the patient.                            All questions were answered, and informed consent                            was obtained. Prior Anticoagulants: The patient has                            taken no previous anticoagulant or antiplatelet                            agents. ASA Grade Assessment: III - A patient with                            severe systemic disease. After reviewing the risks                            and benefits, the patient was deemed in  satisfactory condition to undergo the procedure.                           After obtaining informed consent, the colonoscope                            was passed under direct vision. Throughout the                            procedure, the patient's blood pressure, pulse, and                            oxygen saturations were monitored continuously. The                            CF [id number] #[id number] was introduced through  the anus                            and advanced to the cecum, identified by                            appendiceal orifice and ileocecal valve. The                            colonoscopy was performed without difficulty. The                            patient tolerated the procedure well. The quality                            of the bowel preparation was good. The ileocecal                            valve, appendiceal orifice, and rectum were                            photographed. Scope In: 3:12:15 PM Scope Out: 3:34:54 PM Scope Withdrawal Time: 0 hours 19 minutes 48 seconds  Total Procedure Duration: 0 hours 22 minutes 39 seconds  Findings:                 The digital rectal exam was normal.                           A 7 mm polyp was found in the ascending colon. The                            polyp was sessile. The polyp was removed with a                            cold snare. Resection and retrieval were complete.                           Two sessile polyps were found in the transverse  colon. The polyps were 5 to 6 mm in size. These                            polyps were removed with a cold snare. Resection                            and retrieval were complete.                           Five sessile polyps were found in the descending                            colon. The polyps were 3 to 6 mm in size. These                            polyps were removed with a cold snare. Resection                            and retrieval were complete.                           Multiple small and large-mouthed diverticula were                            found in the sigmoid colon and distal descending                            colon.                           The retroflexed view of the distal rectum and anal                            verge was normal and showed no anal or rectal                            abnormalities. Complications:            No immediate  complications. Estimated Blood Loss:     Estimated blood loss was minimal. Impression:               - One 7 mm polyp in the ascending colon, removed                            with a cold snare. Resected and retrieved.                           - Two 5 to 6 mm polyps in the transverse colon,                            removed with a cold snare. Resected and retrieved.                           - Five 3 to 6 mm  polyps in the descending colon,                            removed with a cold snare. Resected and retrieved.                           - Mild diverticulosis in the sigmoid colon and in                            the distal descending colon.                           - The distal rectum and anal verge are normal on                            retroflexion view. Recommendation:           - Patient has a contact number available for                            emergencies. The signs and symptoms of potential                            delayed complications were discussed with the                            patient. Return to normal activities tomorrow.                            Written discharge instructions were provided to the                            patient.                           - Resume previous diet.                           - Continue present medications.                           - Await pathology results.                           - No recommendation at this time regarding repeat                            colonoscopy due to age. Jerene Bears, MD 03/06/2021 3:44:27 PM This report has been signed electronically.

## 2021-03-06 NOTE — Progress Notes (Signed)
Called to room to assist during endoscopic procedure.  Patient ID and intended procedure confirmed with present staff. Received instructions for my participation in the procedure from the performing physician.  

## 2021-03-06 NOTE — Patient Instructions (Addendum)
Handouts were given to your care partner on polyps, diverticulosis, esophageal dilatation diet to follow the rest of today due to esophageal dilatation and Gastritis. You may resume your current medications today. Keep Esophageal Manometry as scheduled.  However, if symptoms resolve after dilatation and possible future treatment for yeast esophagitis, this can be canceled. Await biopsy results.  May take 1-3 weeks to receive pathology results. Please call if any questions or concerns.      YOU HAD AN ENDOSCOPIC PROCEDURE TODAY AT [facility]:   Refer to the procedure report that was given to you for any specific questions about what was found during the examination.  If the procedure report does not answer your questions, please call your gastroenterologist to clarify.  If you requested that your care partner not be given the details of your procedure findings, then the procedure report has been included in a sealed envelope for you to review at your convenience later.  YOU SHOULD EXPECT: Some feelings of bloating in the abdomen. Passage of more gas than usual.  Walking can help get rid of the air that was put into your GI tract during the procedure and reduce the bloating. If you had a lower endoscopy (such as a colonoscopy or flexible sigmoidoscopy) you may notice spotting of blood in your stool or on the toilet paper. If you underwent a bowel prep for your procedure, you may not have a normal bowel movement for a few days.  Please Note:  You might notice some irritation and congestion in your nose or some drainage.  This is from the oxygen used during your procedure.  There is no need for concern and it should clear up in a day or so.  SYMPTOMS TO REPORT IMMEDIATELY:  Following lower endoscopy (colonoscopy or flexible sigmoidoscopy):  Excessive amounts of blood in the stool  Significant tenderness or worsening of abdominal pains  Swelling of the abdomen that is new,  acute  Fever of 100F or higher  Following upper endoscopy (EGD)  Vomiting of blood or coffee ground material  New chest pain or pain under the shoulder blades  Painful or persistently difficult swallowing  New shortness of breath  Fever of 100F or higher  Black, tarry-looking stools  For urgent or emergent issues, a gastroenterologist can be reached at any hour by calling [phone]. Do not use MyChart messaging for urgent concerns.    DIET:  Please follow the esophageal dilatation diet the rest of today.  Handout was given to your daughter.  Drink plenty of fluids but you should avoid alcoholic beverages for 24 hours.  ACTIVITY:  You should plan to take it easy for the rest of today and you should NOT DRIVE or use heavy machinery until tomorrow (because of the sedation medicines used during the test).    FOLLOW UP: Our staff will call the number listed on your records 48-72 hours following your procedure to check on you and address any questions or concerns that you may have regarding the information given to you following your procedure. If we do not reach you, we will leave a message.  We will attempt to reach you two times.  During this call, we will ask if you have developed any symptoms of COVID 19. If you develop any symptoms (ie: fever, flu-like symptoms, shortness of breath, cough etc.) before then, please call [phone].  If you test positive for Covid 19 in the 2 weeks post procedure, please call and report this  information to [place].    If any biopsies were taken you will be contacted by phone or by letter within the next 1-3 weeks.  Please call us at [phone] if you have not heard about the biopsies in 3 weeks.    SIGNATURES/CONFIDENTIALITY: You and/or your care partner have signed paperwork which will be entered into your electronic medical record.  These signatures attest to the fact that that the information above on your After Visit Summary has been reviewed and  is understood.  Full responsibility of the confidentiality of this discharge information lies with you and/or your care-partner.

## 2021-03-07 ENCOUNTER — Ambulatory Visit: Payer: Medicare Other | Admitting: Nurse Practitioner

## 2021-03-10 ENCOUNTER — Telehealth: Payer: Self-pay

## 2021-03-10 NOTE — Telephone Encounter (Signed)
°  Follow up Call-  Call back number 03/06/2021 11/17/2018  Post procedure Call Back phone  # [phone]  Permission to leave phone message Yes Yes  Some recent data might be hidden     Patient questions:  Do you have a fever, pain , or abdominal swelling? No. Pain Score  0 *  Have you tolerated food without any problems? Yes.    Have you been able to return to your normal activities? Yes.    Do you have any questions about your discharge instructions: Diet   No. Medications  No. Follow up visit  No.  Do you have questions or concerns about your Care? No.  Actions: * If pain score is 4 or above: No action needed, pain <4.  Have you developed a fever since your procedure? no  2.   Have you had an respiratory symptoms (SOB or cough) since your procedure? no  3.   Have you tested positive for COVID 19 since your procedure no  4.   Have you had any family members/close contacts diagnosed with the COVID 19 since your procedure?  no   If yes to any of these questions please route to Joylene Taesean, RN and Joella Prince, RN

## 2021-03-12 ENCOUNTER — Ambulatory Visit: Payer: Medicare Other | Admitting: Nurse Practitioner

## 2021-03-14 ENCOUNTER — Encounter: Payer: Self-pay | Admitting: Internal Medicine

## 2021-03-17 ENCOUNTER — Telehealth: Payer: Self-pay | Admitting: Internal Medicine

## 2021-03-17 NOTE — Telephone Encounter (Signed)
Called and spoke with pt. Pt states dilation helped some but he is still experiencing symptoms. Read pt pathology letter from Dr. Hilarie Fredrickson and let pt know that since he is still experiencing symptoms he should keep esophageal manometry appt. Pt verbalized understanding and had no other concerns at end of call.

## 2021-03-17 NOTE — Telephone Encounter (Signed)
Patient called states he has not heard anything about still needing to proceed with the upcoming procedure at the hospital on 04/09/21. Requested a a call back from nurse to advise.

## 2021-03-18 NOTE — Progress Notes (Addendum)
MEDICARE ANNUAL WELLNESS VISIT AND FOLLOW UP  Assessment:    Atherosclerosis of aorta ([place]) - Per CT 2021 Control blood pressure, cholesterol, glucose, increase exercise.    Essential hypertension Continue current medications Monitor blood pressure at home; call if consistently over 130/80 Continue DASH diet.   Reminder to go to the ER if any CP, SOB, nausea, dizziness, severe HA, changes vision/speech, left arm numbness and tingling and jaw pain. - CBC with Differential/Platelet - CMP/GFR - TSH  Hyperlipidemia associated with type 2 diabetes mellitus (HCC) Statin myopathy Continue fenofibrate 145 mg daily, if elevated consider Zetia Discussed dietary and exercise modifications Low fat diet - lipid panel    Type 2 diabetes mellitus with stage 3a chronic kidney disease, without long-term current use of insulin (HCC) Controlled by diet & exercise Continue to monitor - A1C  Stage 3b chronic kidney disease associated with T2DM (HCC) Increase fluids  Avoid NSAIDS - reviewed in detail today, stop meloxicam, aleve - tylenol is ok Blood pressure control Monitor sugars  Will continue to monitor, refer nephrology if trending down further   Obesity - BMI 34 Long discussion about weight loss, diet, and exercise Discussed final goal weight  Patient didn't tolerate topamax, has also tried phentermine;  Currently making good progress with lifestyle continue close follow up.   Gastroesophageal reflux disease, unspecified whether esophagitis present Hx of stricture s/p dilation, no recurrenct on last EGD Well managed on current medications, Protonix 40 mg BID Discussed diet, avoiding triggers and other lifestyle changes  Medication management - Magnesium  Vitamin D deficiency - VITAMIN D 25 Hydroxy (Vit-D Deficiency, Fractures)  Edema of right lower extremity Vascular ultrasound ordered stat today to r/o DVT Encouraged increased water intake Encouraged use of compression  socks Advised to go to the er if he develops chest pain, shortness of breath, dizziness, increased swelling/redness/pain in right lower extremity Addend: result is negative for DVT and Bakers cyst.- continue to monitor and wear compression socks.  Increase fluids and decrease salt.   Over 30 minutes of face to face interview, exam, counseling, chart review, and critical decision making was performed  Future Appointments  Date Time Provider [place]  05/01/2021  3:00 PM Unk Pinto, MD [id number] None       Subjective:  [name] [name] is a 81 y.o. male who presents for Medicare Annual Wellness Visit. He has Hypertension; GERD (gastroesophageal reflux disease); Kidney stones; Obesity (BMI 30.0-34.9); Vitamin D deficiency; Medication management; Hyperparathyroidism, primary ([place]); CKD stage 3 due to type 2 diabetes mellitus ([place]); OA (osteoarthritis) of hip; Normocytic anemia; Vitamin B12 deficiency; Herniated nucleus pulposus, L2-3 left; Spinal stenosis; Hyperlipidemia associated with type 2 diabetes mellitus ([place]); Fixation hardware in spine; Aortic atherosclerosis (HCC) by Lumbar CT scan on 11/22/2019; Type 2 diabetes mellitus with stage 3a chronic kidney disease, without long-term current use of insulin ([place]); History of adenomatous polyp of colon; Erectile dysfunction associated with type 2 diabetes mellitus ([place]); Statin myopathy; and Dysphagia on their problem list.   He has had multiple surgeries on L hip for failed arthroplasty, Ortho unfortunately suspects this will be permanent. Continues with Dr. Ellene Route for back pain. Pain medication  He has noticed swelling of right lower leg which has been present x 1 month and appears to be increasing in size. Does have some paresthesias in that leg. He does not wear compression socks. Does not get enough fluid intake during the day.  Does not add salt to food but otherwise is not limiting salt  intake.  Denies chest pain, shortness of  breath, dizziness.  Continues to have issues with GERD, currently taking Protonix BID.  BMI is Body mass index is 36.15 kg/m., he is working on diet and exercise. Working on diet, PT twice a week. Didn't tolerate phentermine/topamax (sedation, likely due to topamax).  Colonoscopy weight was down to 238 has gained 21 pounds, was 250 09/2020.   Wt Readings from Last 3 Encounters:  03/20/21 259 lb 3.2 oz (117.6 kg)  03/06/21 238 lb (108 kg)  01/08/21 238 lb 9.6 oz (108.2 kg)   His blood pressure has been controlled at home, today their BP is BP: 140/80 BP Readings from Last 3 Encounters:  03/20/21 140/80  03/06/21 140/87  01/08/21 102/60    He does workout, PT 1 hour twice. He denies chest pain, shortness of breath, dizziness.   He has aortic atherosclerosis per CT 11/2019.   He is on cholesterol medication (fenofibrate, fish oil), hx of statin myalgias reported (remote, can't recall agents, numerous per patient). His cholesterol is not at goal. The cholesterol last visit was:   Lab Results  Component Value Date   CHOL 195 10/03/2020   HDL 54 10/03/2020   LDLCALC 117 (H) 10/03/2020   TRIG 126 10/03/2020   CHOLHDL 3.6 10/03/2020   He has been working on diet and exercise for prediabetes/T2DM with CKD 3, and denies paresthesia of the feet, polydipsia, polyuria and visual disturbances. Last A1C in the office was:  Lab Results  Component Value Date   HGBA1C 6.1 (H) 10/03/2020   He has had CKD III for several years, monitoring. Does take NSAIDS, receptive to stopping. Also hx of recurrent renal stones, follows [facility]. Last GFR  Lab Results  Component Value Date   GFRNONAA 38 (L) 04/24/2020   GFRNONAA 48 (L) 12/07/2019   GFRNONAA 45 (L) 12/06/2019   Patient is on Vitamin D supplement.   Lab Results  Component Value Date   VD25OH 47 04/24/2020     He has hx of primary hyperparathyroid, s/p parathyroidectomy by Dr. Harlow Asa with recent normal calcium/PTH:   Lab Results   Component Value Date   PTH 17 04/24/2020   CALCIUM 10.1 11/07/2020   CAION 1.15 12/05/2019   PHOS 3.8 10/03/2020     Medication Review: Current Outpatient Medications on File Prior to Visit  Medication Sig Dispense Refill   aspirin EC 81 MG tablet Restart on 12/11/2019 1 tablet 0   b complex vitamins tablet Take 1 tablet by mouth daily.     bisoprolol-hydrochlorothiazide (ZIAC) 5-6.25 MG tablet Take 1 tablet by mouth in the morning. Take 1 tablet Daily for BP 90 tablet 1   cholecalciferol (VITAMIN D3) 25 MCG (1000 UNIT) tablet Take 1,000 Units by mouth daily.     fenofibrate (TRICOR) 145 MG tablet TAKE 1 TABLET DAILY FOR TRIGLYCERIDES (BLOOD FATS) 90 tablet 3   finasteride (PROSCAR) 5 MG tablet Take 5 mg by mouth daily.     Flaxseed, Linseed, (FLAXSEED OIL) 1200 MG CAPS Take 1 capsule by mouth daily.     gabapentin (NEURONTIN) 300 MG capsule Take 300 mg by mouth 2 (two) times daily.     losartan (COZAAR) 100 MG tablet TAKE 1 TABLET BY MOUTH DAILY FOR BLOOD PRESSURE 90 tablet 3   magnesium oxide (MAG-OX) 400 MG tablet Take 400 mg by mouth daily.     Omega-3 Fatty Acids (FISH OIL) 1200 MG CAPS Restart on 10.11 1 capsule 0   pantoprazole (PROTONIX) 40  MG tablet Take 1 tablet Daily for Indigestion & Acid Reflux 90 tablet 3   tamsulosin (FLOMAX) 0.4 MG CAPS capsule Take 0.4 mg by mouth daily after breakfast.   3   sildenafil (VIAGRA) 100 MG tablet Take 100 mg by mouth daily as needed for erectile dysfunction. (Patient not taking: Reported on 01/08/2021)     No current facility-administered medications on file prior to visit.    Current Problems (verified) Patient Active Problem List   Diagnosis Date Noted   Dysphagia 01/08/2021   Type 2 diabetes mellitus with stage 3a chronic kidney disease, without long-term current use of insulin ([place]) 10/03/2020   History of adenomatous polyp of colon 10/03/2020   Erectile dysfunction associated with type 2 diabetes mellitus ([place]) 10/03/2020    Statin myopathy 10/03/2020   Aortic atherosclerosis ([place]) by Lumbar CT scan on 11/22/2019 04/23/2020   Fixation hardware in spine 12/05/2019   Hyperlipidemia associated with type 2 diabetes mellitus ([place]) 09/11/2019   Spinal stenosis 09/05/2019   Herniated nucleus pulposus, L2-3 left 11/09/2017   Normocytic anemia 05/02/2017   Vitamin B12 deficiency 05/02/2017   OA (osteoarthritis) of hip 06/24/2016   CKD stage 3 due to type 2 diabetes mellitus ([place]) 03/25/2015   Hyperparathyroidism, primary ([place]) 07/09/2014   Vitamin D deficiency 06/13/2013   Medication management 06/13/2013   Hypertension    GERD (gastroesophageal reflux disease)    Kidney stones    Obesity (BMI 30.0-34.9)     Screening Tests Immunization History  Administered Date(s) Administered   Influenza Split 12/27/2012   Influenza, High Dose Seasonal PF 01/31/2014, 11/27/2014, 01/12/2017, 01/13/2018   Influenza-Unspecified 12/16/2016, 01/12/2017   PFIZER(Purple Top)SARS-COV-2 Vaccination 04/19/2019, 05/10/2019, 01/31/2020, 09/16/2020   Pneumococcal Conjugate-13 03/25/2015   Pneumococcal Polysaccharide-23 03/02/2008   Td 03/02/2005, 09/24/2015   Zoster, Live 03/02/2006    Preventative care: Last colonoscopy: 01/2017, adenomatous polyps, 3 year recall, Dr. Silverio Decamp EGD 11/2018  Prior vaccinations: TD or Tdap: 2017 Influenza: 2019  Pneumococcal: 2010 Prevnar13: 2017 Shingles/Zostavax: 2008 - ask about shingrix  Covid 19: 2/2 + booster x 2   Names of Other Physician/Practitioners you currently use: 1. [facility] here for primary care 2. Dr. Katy Fitch, eye doctor, last visit 2022 3. Dr. Altamese [place], dentist, last visit 2022, q73m  Patient Care Team: Unk Pinto, MD as PCP - General (Internal Medicine) Newt Minion, MD as Consulting Physician (Orthopedic Surgery) Inda Castle, MD (Inactive) as Consulting Physician (Gastroenterology) Franchot Gallo, MD as Consulting  Physician (Urology)  Allergies Allergies  Allergen Reactions   Iodinated Contrast Media Shortness Of Breath   Iohexol Shortness Of Breath   Statins     Myalgia, with several per patient    SURGICAL HISTORY He  has a past surgical history that includes tumor ear (Left, age 63); Cystoscopy w/ ureteroscopy w/ lithotripsy (Left, 05/04/2005); Colonoscopy (02/17/2005); Nephrolithotomy (Left, 02/01/2014); Parathyroidectomy (Left, 07/10/2014); CYSTO/  LEFT RETROGRADE PYELOGRAM/ STENT PLACEMENT (01/26/2005); Esophagogastroduodenoscopy (egd) with esophageal dilation (x4  last one 07-21-2010); Inguinal hernia repair (Left, yrs ago); Cystoscopy/retrograde/ureteroscopy/stone extraction with basket (Left, 12/30/2015); Cystoscopy with stent placement (Left, 12/30/2015); Holmium laser application (Left, 34/19/3790); Cystoscopy/ureteroscopy/holmium laser/stent placement (Left, 03/09/2016); Total hip arthroplasty (Left, 06/24/2016); Total hip revision (Left, 11/18/2016); ORIF hip fracture (Left, 04/16/2017); Lumbar laminectomy/decompression microdiscectomy (Left, 11/09/2017); Back surgery (04/2018); Posterior lumbar fusion (09/07/2019); and Cataract extraction (Bilateral, 2022). FAMILY HISTORY His family history includes Alzheimer's disease in his mother; Bladder Cancer in his sister; Diabetes in his sister; Heart disease in his father and  mother. SOCIAL HISTORY He  reports that he quit smoking about 47 years ago. His smoking use included cigarettes. He has never used smokeless tobacco. He reports current alcohol use of about 1.0 - 2.0 standard drink per week. He reports that he does not use drugs.    Objective:   Today's Vitals   03/20/21 1102  BP: 140/80  Pulse: (!) 58  Temp: 97.7 F (36.5 C)  SpO2: 96%  Weight: 259 lb 3.2 oz (117.6 kg)     Body mass index is 36.15 kg/m.  General appearance: alert, no distress, WD/WN, male HEENT: normocephalic, sclerae anicteric, TMs pearly, nares patent, no  discharge or erythema, pharynx normal Oral cavity: MMM, no lesions, crowded mouth Neck: supple, large neck circumference, no lymphadenopathy, no thyromegaly, no masses Heart: RRR, normal S1, S2, no murmurs Lungs: CTA bilaterally, no wheezes, rhonchi, or rales Abdomen: +bs, soft, obese, non tender, non distended, no masses, no hepatomegaly, no splenomegaly Musculoskeletal: nontender, no swelling, no obvious deformity, antalgic gait with cane Extremities: 2+ pitting edema of right lower leg. no cyanosis, no clubbing. Right calf 15 3/4 inches, left calf 15 Right ankle 10 inches, left ankle 9.5 inches Right lower leg slightly erythematous, not warm to the touch. Pulses: 2+ symmetric, upper and lower extremities, normal cap refill Neurological: alert, oriented x 3, CN2-12 intact, strength normal upper extremities, weakness in bilateral lower extremities, sensation diminished in right foot, no cerebellar signs, gait antalgic with cane Psychiatric: normal affect, behavior normal, pleasant     Magda Bernheim, NP   03/20/2021

## 2021-03-19 ENCOUNTER — Encounter: Payer: Medicare Other | Admitting: Internal Medicine

## 2021-03-19 DIAGNOSIS — M48062 Spinal stenosis, lumbar region with neurogenic claudication: Secondary | ICD-10-CM | POA: Diagnosis not present

## 2021-03-20 ENCOUNTER — Other Ambulatory Visit: Payer: Self-pay

## 2021-03-20 ENCOUNTER — Ambulatory Visit (INDEPENDENT_AMBULATORY_CARE_PROVIDER_SITE_OTHER): Payer: Medicare Other | Admitting: Nurse Practitioner

## 2021-03-20 ENCOUNTER — Encounter: Payer: Self-pay | Admitting: Nurse Practitioner

## 2021-03-20 ENCOUNTER — Ambulatory Visit (HOSPITAL_COMMUNITY)
Admission: RE | Admit: 2021-03-20 | Discharge: 2021-03-20 | Disposition: A | Discharge status: Home / Self Care | Payer: Medicare Other | Source: Ambulatory Visit | Attending: Internal Medicine | Admitting: Internal Medicine

## 2021-03-20 VITALS — BP 140/80 | HR 58 | Temp 97.7°F | Wt 259.2 lb

## 2021-03-20 DIAGNOSIS — I10 Essential (primary) hypertension: Secondary | ICD-10-CM | POA: Diagnosis not present

## 2021-03-20 DIAGNOSIS — N1831 Chronic kidney disease, stage 3a: Secondary | ICD-10-CM | POA: Diagnosis not present

## 2021-03-20 DIAGNOSIS — E1122 Type 2 diabetes mellitus with diabetic chronic kidney disease: Secondary | ICD-10-CM | POA: Diagnosis not present

## 2021-03-20 DIAGNOSIS — E1169 Type 2 diabetes mellitus with other specified complication: Secondary | ICD-10-CM

## 2021-03-20 DIAGNOSIS — R600 Localized edema: Secondary | ICD-10-CM

## 2021-03-20 DIAGNOSIS — E669 Obesity, unspecified: Secondary | ICD-10-CM

## 2021-03-20 DIAGNOSIS — E785 Hyperlipidemia, unspecified: Secondary | ICD-10-CM | POA: Diagnosis not present

## 2021-03-20 DIAGNOSIS — I700 Atherosclerosis of aorta: Secondary | ICD-10-CM | POA: Diagnosis not present

## 2021-03-20 DIAGNOSIS — Z79899 Other long term (current) drug therapy: Secondary | ICD-10-CM

## 2021-03-20 DIAGNOSIS — K219 Gastro-esophageal reflux disease without esophagitis: Secondary | ICD-10-CM | POA: Diagnosis not present

## 2021-03-20 DIAGNOSIS — E559 Vitamin D deficiency, unspecified: Secondary | ICD-10-CM

## 2021-03-20 DIAGNOSIS — N183 Chronic kidney disease, stage 3 unspecified: Secondary | ICD-10-CM | POA: Diagnosis not present

## 2021-03-20 MED ORDER — PANTOPRAZOLE SODIUM 40 MG PO TBEC: DELAYED_RELEASE_TABLET | ORAL | 3 refills | Status: DC

## 2021-03-20 NOTE — Patient Instructions (Signed)
Venous Thromboembolism Prevention °Venous thromboembolism (VTE) is a condition that includes deep vein thrombosis (DVT) and pulmonary embolism (PE). A DVT is a blood clot, also called a thrombus, that occurs in a deep vein. It usually occurs in the leg but can also occur in the pelvis, arm, or neck. °Sometimes, pieces of a blood clot can break off and travel through the bloodstream to other parts of the body. When that happens, the blood clot is called an embolus. An embolus that travels to the arteries of the lungs is called a pulmonary embolism, and it can severely impair the function of the lungs, heart, and blood vessels. An embolism can block the blood flow in the blood vessels of other organs as well. °How can this condition affect me? °VTE is a serious health condition that can cause disability or death. It is very important to get help right away. Do not ignore your symptoms. °What can increase my risk? °You are more likely to develop this condition if you: °Have had recent major surgery. °Have had recent major trauma, such as a broken bone (fracture) or an injury to an organ. °Have certain health conditions, such as cancer or a blood disorder that increases the risk for blood clots. °Are hospitalized for an illness. °Are wearing a splint or cast for a bone fracture and are unable to move that extremity for long periods of time. °Have a personal or family history of VTE. °Are 79 years of age or older. °Other risks include: °Taking certain medicines, such as birth control pills or hormone replacement therapy. °Being pregnant or recently giving birth. °Being overweight. °Using products that contain nicotine and tobacco. °Not moving for a long period of time. This may include being on bed rest or long-distance travel in an airplane or car. °What actions can I take to prevent this? °If you are in the hospital: °A VTE may be prevented by taking medicines that are prescribed to prevent blood clots  (anticoagulants). You can also help to prevent VTE while in the hospital by taking these actions: °Get out of bed and walk. This keeps blood moving through the veins, which decreases the risk of developing VTE. Ask your health care provider if this is safe for you to do. °Ask your health care provider if you should use a sequential compression device (SCD). This is a machine that pumps air into compression sleeves that are wrapped around your legs. °Ask your health care provider if you should wear tight, elastic stockings that apply pressure to the lower legs (compression stockings). Compression stockings are sometimes used with SCDs. °At home after a surgery °If you had surgery, understand that you have an increased risk for VTE for the first 4-6 weeks after the surgery. During this time: °Avoid traveling for more than 4 hours. If you must travel after surgery, ask your health care provider about additional preventive actions that you can take, such as frequent breaks to move around and walk. °Avoid sitting or lying still for too long. If possible, get up and walk around one time every hour. Ask your health care provider when this is safe for you to do. °Stay active as directed by your health care provider. °While traveling °Travel that takes more than 4 hours can increase the risk of a VTE. To prevent VTE when traveling: °Exercise your arms and legs every hour. You can do this by standing, stretching, and bending and straightening your arms and legs. If you are traveling by airplane, train,   or bus, walk up and down the aisle as often as possible to get your blood moving. If you are traveling by car, stop and get out of the car every hour to walk, stretch, and exercise your arms and legs. Other types of exercise might include: °Keeping your feet flat on the ground and raising your toes. °Switching from tightening the muscles in your calves and thighs to relaxing those same muscles while you are sitting. °Pointing  and flexing your feet at the ankle joints while you are sitting. °Drink enough water to keep your urine pale yellow. °Wear compression stockings during long travel periods. °Avoid drinking alcohol during long travel. °Generally, it is not recommended that you take medicines to prevent DVT during routine travel. °Other tips °Some other actions you can take in your daily life to help prevent blood clots include: °  °Stay active. °Avoid sitting or lying in bed for long periods of time without moving your arms and legs. °Exercise by moving your arms and legs for 30 minutes or more every day. °Avoid crossing your legs when you are sitting. °Wear compression stockings as told by your health care provider. These stockings help to prevent blood clots and reduce swelling in your legs. Do not let them bunch up when you are wearing them. Avoid wearing other tight clothing around your legs or waist. °Do not use any products that contain nicotine or tobacco. These products include cigarettes, chewing tobacco, and vaping devices, such as e-cigarettes. °This is especially important if you take estrogen medicines. °If you need help quitting, ask your health care provider. °Avoid using medicines that contain estrogen if you do not need them. These include birth control pills and hormone replacement therapy. °Maintain a healthy weight. °Where to find more information °Centers for Disease Control and Prevention: www.cdc.gov °American Heart Association: www.heart.org °National Heart, Lung, and Blood Institute: www.nhlbi.nih.gov °Get help right away if: °You have chest pain or shortness of breath. °You cough up blood. °You have a rapid or irregular heartbeat. °You feel light-headed or dizzy. °You have new or increased pain, swelling, or redness in an arm or leg. °You have numbness or tingling in an arm or leg. °You are on blood thinning medicines and have a sudden severe headache or blood in your vomit, stool, or urine. °These symptoms  may be an emergency. Get help right away. Call 911. °Do not wait to see if the symptoms will go away. °Do not drive yourself to the hospital. °Summary °Venous thromboembolism (VTE) is a condition that includes deep vein thrombosis (DVT) and pulmonary embolism (PE). °VTE is a serious health condition that can cause disability or death. It is very important to get help right away. Do not ignore your symptoms. °Risk factors include immobility, trauma to the veins, major surgeries, and certain medical conditions that increase the risk of forming blood clots. °Avoid sitting or lying in bed for long periods of time without moving your arms and legs. °Get help right away if you are on blood thinning medicines and have a sudden severe headache or blood in your vomit, stool, or urine. °This information is not intended to replace advice given to you by your health care provider. Make sure you discuss any questions you have with your health care provider. °Document Revised: 10/01/2020 Document Reviewed: 09/09/2020 °Elsevier Patient Education © 2022 [employer] ° °

## 2021-03-21 LAB — COMPLETE METABOLIC PANEL WITH GFR
AG Ratio: 1.4 (calc) (ref 1.0–2.5)
ALT: 14 U/L (ref 9–46)
AST: 18 U/L (ref 10–35)
Albumin: 4 g/dL (ref 3.6–5.1)
Alkaline phosphatase (APISO): 35 U/L (ref 35–144)
BUN/Creatinine Ratio: 11 (calc) (ref 6–22)
BUN: 17 mg/dL (ref 7–25)
CO2: 30 mmol/L (ref 20–32)
Calcium: 9.3 mg/dL (ref 8.6–10.3)
Chloride: 104 mmol/L (ref 98–110)
Creat: 1.48 mg/dL — ABNORMAL HIGH (ref 0.70–1.28)
Globulin: 2.8 g/dL (calc) (ref 1.9–3.7)
Glucose, Bld: 96 mg/dL (ref 65–99)
Potassium: 4.7 mmol/L (ref 3.5–5.3)
Sodium: 139 mmol/L (ref 135–146)
Total Bilirubin: 0.6 mg/dL (ref 0.2–1.2)
Total Protein: 6.8 g/dL (ref 6.1–8.1)
eGFR: 48 mL/min/{1.73_m2} — ABNORMAL LOW (ref >=60)

## 2021-03-21 LAB — HEMOGLOBIN A1C
Hgb A1c MFr Bld: 6.1 % of total Hgb — ABNORMAL HIGH (ref <5.7)
Mean Plasma Glucose: 128 mg/dL
eAG (mmol/L): 7.1 mmol/L

## 2021-03-21 LAB — CBC WITH DIFFERENTIAL/PLATELET
Absolute Monocytes: 730 cells/uL (ref 200–950)
Basophils Absolute: 58 cells/uL (ref 0–200)
Basophils Relative: 0.8 %
Eosinophils Absolute: 438 cells/uL (ref 15–500)
Eosinophils Relative: 6 %
HCT: 38.6 % (ref 38.5–50.0)
Hemoglobin: 12.7 g/dL — ABNORMAL LOW (ref 13.2–17.1)
Lymphs Abs: 1482 cells/uL (ref 850–3900)
MCH: 29.5 pg (ref 27.0–33.0)
MCHC: 32.9 g/dL (ref 32.0–36.0)
MCV: 89.8 fL (ref 80.0–100.0)
MPV: 10.7 fL (ref 7.5–12.5)
Monocytes Relative: 10 %
Neutro Abs: 4592 cells/uL (ref 1500–7800)
Neutrophils Relative %: 62.9 %
Platelets: 304 10*3/uL (ref 140–400)
RBC: 4.3 10*6/uL (ref 4.20–5.80)
RDW: 14.3 % (ref 11.0–15.0)
Total Lymphocyte: 20.3 %
WBC: 7.3 10*3/uL (ref 3.8–10.8)

## 2021-03-21 LAB — LIPID PANEL
Cholesterol: 186 mg/dL (ref <200)
HDL: 55 mg/dL (ref >=40)
LDL Cholesterol (Calc): 110 mg/dL (calc) — ABNORMAL HIGH
Non-HDL Cholesterol (Calc): 131 mg/dL (calc) — ABNORMAL HIGH (ref <130)
Total CHOL/HDL Ratio: 3.4 (calc) (ref <5.0)
Triglycerides: 106 mg/dL (ref <150)

## 2021-03-31 ENCOUNTER — Other Ambulatory Visit: Payer: Self-pay | Admitting: Adult Health

## 2021-04-01 ENCOUNTER — Encounter (HOSPITAL_COMMUNITY): Payer: Self-pay | Admitting: Internal Medicine

## 2021-04-07 DIAGNOSIS — M79672 Pain in left foot: Secondary | ICD-10-CM | POA: Diagnosis not present

## 2021-04-07 DIAGNOSIS — M79642 Pain in left hand: Secondary | ICD-10-CM | POA: Diagnosis not present

## 2021-04-07 DIAGNOSIS — M7660 Achilles tendinitis, unspecified leg: Secondary | ICD-10-CM | POA: Diagnosis not present

## 2021-04-07 DIAGNOSIS — M2550 Pain in unspecified joint: Secondary | ICD-10-CM | POA: Diagnosis not present

## 2021-04-07 DIAGNOSIS — M549 Dorsalgia, unspecified: Secondary | ICD-10-CM | POA: Diagnosis not present

## 2021-04-07 DIAGNOSIS — M79671 Pain in right foot: Secondary | ICD-10-CM | POA: Diagnosis not present

## 2021-04-07 DIAGNOSIS — M25571 Pain in right ankle and joints of right foot: Secondary | ICD-10-CM | POA: Diagnosis not present

## 2021-04-07 DIAGNOSIS — M1990 Unspecified osteoarthritis, unspecified site: Secondary | ICD-10-CM | POA: Diagnosis not present

## 2021-04-07 DIAGNOSIS — M25471 Effusion, right ankle: Secondary | ICD-10-CM | POA: Diagnosis not present

## 2021-04-07 DIAGNOSIS — M79641 Pain in right hand: Secondary | ICD-10-CM | POA: Diagnosis not present

## 2021-04-07 DIAGNOSIS — M7989 Other specified soft tissue disorders: Secondary | ICD-10-CM | POA: Diagnosis not present

## 2021-04-09 ENCOUNTER — Encounter (HOSPITAL_COMMUNITY): Payer: Self-pay | Admitting: Internal Medicine

## 2021-04-09 ENCOUNTER — Encounter (HOSPITAL_COMMUNITY)
Admission: RE | Disposition: A | Discharge status: Home / Self Care | Payer: Self-pay | Source: Home / Self Care | Attending: Internal Medicine

## 2021-04-09 ENCOUNTER — Ambulatory Visit (HOSPITAL_COMMUNITY)
Admission: RE | Admit: 2021-04-09 | Discharge: 2021-04-09 | Disposition: A | Discharge status: Home / Self Care | Payer: Medicare Other | Attending: Internal Medicine | Admitting: Internal Medicine

## 2021-04-09 DIAGNOSIS — R1310 Dysphagia, unspecified: Secondary | ICD-10-CM | POA: Insufficient documentation

## 2021-04-09 DIAGNOSIS — R1319 Other dysphagia: Secondary | ICD-10-CM

## 2021-04-09 HISTORY — PX: ESOPHAGEAL MANOMETRY: SHX5429

## 2021-04-09 SURGERY — MANOMETRY, ESOPHAGUS

## 2021-04-09 MED ORDER — LIDOCAINE VISCOUS HCL 2 % MT SOLN
OROMUCOSAL | Status: AC
  Filled 2021-04-09: qty 15

## 2021-04-09 SURGICAL SUPPLY — 2 items
FACESHIELD LNG OPTICON STERILE (SAFETY) IMPLANT
GLOVE BIO SURGEON STRL SZ8 (GLOVE) ×4 IMPLANT

## 2021-04-09 NOTE — Progress Notes (Signed)
Esophageal Manometry done per protocol. Patient tolerated well without distress or complication.  

## 2021-04-10 DIAGNOSIS — D3132 Benign neoplasm of left choroid: Secondary | ICD-10-CM | POA: Diagnosis not present

## 2021-04-10 DIAGNOSIS — Z961 Presence of intraocular lens: Secondary | ICD-10-CM | POA: Diagnosis not present

## 2021-05-01 ENCOUNTER — Encounter: Payer: Medicare Other | Admitting: Internal Medicine

## 2021-05-01 ENCOUNTER — Other Ambulatory Visit: Payer: Self-pay

## 2021-05-01 DIAGNOSIS — R1310 Dysphagia, unspecified: Secondary | ICD-10-CM

## 2021-05-05 DIAGNOSIS — N189 Chronic kidney disease, unspecified: Secondary | ICD-10-CM | POA: Diagnosis not present

## 2021-05-05 DIAGNOSIS — M25571 Pain in right ankle and joints of right foot: Secondary | ICD-10-CM | POA: Diagnosis not present

## 2021-05-05 DIAGNOSIS — M7660 Achilles tendinitis, unspecified leg: Secondary | ICD-10-CM | POA: Diagnosis not present

## 2021-05-05 DIAGNOSIS — M4690 Unspecified inflammatory spondylopathy, site unspecified: Secondary | ICD-10-CM | POA: Diagnosis not present

## 2021-05-05 DIAGNOSIS — M25471 Effusion, right ankle: Secondary | ICD-10-CM | POA: Diagnosis not present

## 2021-05-05 DIAGNOSIS — M1990 Unspecified osteoarthritis, unspecified site: Secondary | ICD-10-CM | POA: Diagnosis not present

## 2021-05-05 DIAGNOSIS — M79671 Pain in right foot: Secondary | ICD-10-CM | POA: Diagnosis not present

## 2021-05-05 DIAGNOSIS — M2550 Pain in unspecified joint: Secondary | ICD-10-CM | POA: Diagnosis not present

## 2021-05-05 DIAGNOSIS — M549 Dorsalgia, unspecified: Secondary | ICD-10-CM | POA: Diagnosis not present

## 2021-05-12 ENCOUNTER — Telehealth: Payer: Self-pay | Admitting: Internal Medicine

## 2021-05-12 NOTE — Telephone Encounter (Signed)
Inbound call from patient's wife requesting to schedule CT scan.  Please advise. ?

## 2021-05-12 NOTE — Telephone Encounter (Signed)
Spoke with wife and gave pt's wife number to radiology scheduling to get CT scan scheduled. Pt's wife verbalized understanding and had no other concerns at end of call.  ?

## 2021-05-13 DIAGNOSIS — M25552 Pain in left hip: Secondary | ICD-10-CM | POA: Diagnosis not present

## 2021-05-21 ENCOUNTER — Other Ambulatory Visit: Payer: Self-pay

## 2021-05-21 ENCOUNTER — Ambulatory Visit (HOSPITAL_COMMUNITY)
Admission: RE | Admit: 2021-05-21 | Discharge: 2021-05-21 | Disposition: A | Discharge status: Home / Self Care | Payer: Medicare Other | Source: Ambulatory Visit | Attending: Internal Medicine | Admitting: Internal Medicine

## 2021-05-21 DIAGNOSIS — R1310 Dysphagia, unspecified: Secondary | ICD-10-CM | POA: Diagnosis not present

## 2021-05-21 DIAGNOSIS — I700 Atherosclerosis of aorta: Secondary | ICD-10-CM | POA: Insufficient documentation

## 2021-05-21 DIAGNOSIS — J984 Other disorders of lung: Secondary | ICD-10-CM | POA: Diagnosis not present

## 2021-07-04 ENCOUNTER — Other Ambulatory Visit: Payer: Self-pay | Admitting: Rheumatology

## 2021-07-04 DIAGNOSIS — M79671 Pain in right foot: Secondary | ICD-10-CM | POA: Diagnosis not present

## 2021-07-04 DIAGNOSIS — M2550 Pain in unspecified joint: Secondary | ICD-10-CM | POA: Diagnosis not present

## 2021-07-04 DIAGNOSIS — M25471 Effusion, right ankle: Secondary | ICD-10-CM | POA: Diagnosis not present

## 2021-07-04 DIAGNOSIS — M4690 Unspecified inflammatory spondylopathy, site unspecified: Secondary | ICD-10-CM | POA: Diagnosis not present

## 2021-07-04 DIAGNOSIS — N189 Chronic kidney disease, unspecified: Secondary | ICD-10-CM | POA: Diagnosis not present

## 2021-07-04 DIAGNOSIS — M1990 Unspecified osteoarthritis, unspecified site: Secondary | ICD-10-CM | POA: Diagnosis not present

## 2021-07-04 DIAGNOSIS — M549 Dorsalgia, unspecified: Secondary | ICD-10-CM | POA: Diagnosis not present

## 2021-07-04 DIAGNOSIS — M7660 Achilles tendinitis, unspecified leg: Secondary | ICD-10-CM | POA: Diagnosis not present

## 2021-07-04 DIAGNOSIS — M25571 Pain in right ankle and joints of right foot: Secondary | ICD-10-CM | POA: Diagnosis not present

## 2021-07-04 DIAGNOSIS — M25579 Pain in unspecified ankle and joints of unspecified foot: Secondary | ICD-10-CM

## 2021-07-08 ENCOUNTER — Telehealth: Payer: Self-pay | Admitting: Internal Medicine

## 2021-07-08 DIAGNOSIS — Z87442 Personal history of urinary calculi: Secondary | ICD-10-CM | POA: Diagnosis not present

## 2021-07-08 DIAGNOSIS — I129 Hypertensive chronic kidney disease with stage 1 through stage 4 chronic kidney disease, or unspecified chronic kidney disease: Secondary | ICD-10-CM | POA: Diagnosis not present

## 2021-07-08 DIAGNOSIS — N1832 Chronic kidney disease, stage 3b: Secondary | ICD-10-CM | POA: Diagnosis not present

## 2021-07-08 NOTE — Telephone Encounter (Signed)
Please see esophageal manometry for details; patient has EGJ outflow obstruction ?I spoke to him earlier this year and he did not want additional therapy. ? ?Botox to the LES performed in the outpatient hospital setting may significantly improve swallowing dysfunction. ?I would be willing to perform this if he is agreeable. ? ?Please let me know ?

## 2021-07-08 NOTE — Telephone Encounter (Signed)
Patient's wife called and stated patient is still having difficulty swallowing and wants to know what can be done to help him.  Please call patient and advise.  Thank you. ?

## 2021-07-08 NOTE — Telephone Encounter (Signed)
Pts wife calling, states pt is having more issues swallowing and he chokes on almost everything. States it does not make a difference what he is eating. She wanted to know if there is something he can take/be given to help with this. Please advise. ?

## 2021-07-10 ENCOUNTER — Encounter: Payer: Medicare Other | Admitting: Internal Medicine

## 2021-07-10 ENCOUNTER — Ambulatory Visit
Admission: RE | Admit: 2021-07-10 | Discharge: 2021-07-10 | Disposition: A | Discharge status: Home / Self Care | Payer: Medicare Other | Source: Ambulatory Visit | Attending: Rheumatology | Admitting: Rheumatology

## 2021-07-10 DIAGNOSIS — S93421A Sprain of deltoid ligament of right ankle, initial encounter: Secondary | ICD-10-CM | POA: Diagnosis not present

## 2021-07-10 DIAGNOSIS — M19071 Primary osteoarthritis, right ankle and foot: Secondary | ICD-10-CM | POA: Diagnosis not present

## 2021-07-10 DIAGNOSIS — M7731 Calcaneal spur, right foot: Secondary | ICD-10-CM | POA: Diagnosis not present

## 2021-07-10 DIAGNOSIS — M79671 Pain in right foot: Secondary | ICD-10-CM

## 2021-07-10 DIAGNOSIS — M7989 Other specified soft tissue disorders: Secondary | ICD-10-CM | POA: Diagnosis not present

## 2021-07-10 DIAGNOSIS — M25579 Pain in unspecified ankle and joints of unspecified foot: Secondary | ICD-10-CM

## 2021-07-10 DIAGNOSIS — R600 Localized edema: Secondary | ICD-10-CM | POA: Diagnosis not present

## 2021-07-10 DIAGNOSIS — S86311A Strain of muscle(s) and tendon(s) of peroneal muscle group at lower leg level, right leg, initial encounter: Secondary | ICD-10-CM | POA: Diagnosis not present

## 2021-07-15 ENCOUNTER — Telehealth: Payer: Self-pay

## 2021-07-15 NOTE — Telephone Encounter (Signed)
Spoke with pts wife about possibly having procedure with Dr. Havery Moros on Thursday. She stated the pt was not home but she would discuss it with him and they would call back.  ?

## 2021-07-15 NOTE — Telephone Encounter (Signed)
Patients wife called back, stated that damondre wanted to discuss appointment with you. Please advise.  ? ?[phone]  ?

## 2021-07-15 NOTE — Telephone Encounter (Signed)
Patient is questioning if he can take Advil with the medications that he is currently taking.  ?Per Dr. Melford Aase, No- It's bad for the kidneys. ? ?States also, since taking aspirin daily, he is bruising a lot. Per Dr. Melford Aase, cut back to taking it three days a week. ? ?Patient advised.  ?

## 2021-07-16 NOTE — Telephone Encounter (Signed)
Spoke with pt and he states he does not want to do the EGD with botox at this time. States he may just be eating to fast, he wants to hold off on procedure at this time. ?

## 2021-07-29 ENCOUNTER — Telehealth: Payer: Self-pay | Admitting: Internal Medicine

## 2021-07-29 NOTE — Chronic Care Management (AMB) (Signed)
  Chronic Care Management   Outreach Note  07/29/2021 Name: [name] [name] MRN: [id number] DOB: May 24, 1940  Referred by: Unk Pinto, MD Reason for referral : No chief complaint on file.   An unsuccessful telephone outreach was attempted today. The patient was referred to the pharmacist for assistance with care management and care coordination.   Follow Up Plan:   Tatjana Dellinger Upstream Scheduler

## 2021-08-01 DIAGNOSIS — M48062 Spinal stenosis, lumbar region with neurogenic claudication: Secondary | ICD-10-CM | POA: Diagnosis not present

## 2021-08-06 DIAGNOSIS — H04123 Dry eye syndrome of bilateral lacrimal glands: Secondary | ICD-10-CM | POA: Diagnosis not present

## 2021-08-06 DIAGNOSIS — H353131 Nonexudative age-related macular degeneration, bilateral, early dry stage: Secondary | ICD-10-CM | POA: Diagnosis not present

## 2021-08-06 DIAGNOSIS — Z961 Presence of intraocular lens: Secondary | ICD-10-CM | POA: Diagnosis not present

## 2021-08-06 DIAGNOSIS — I10 Essential (primary) hypertension: Secondary | ICD-10-CM | POA: Diagnosis not present

## 2021-08-08 DIAGNOSIS — M7732 Calcaneal spur, left foot: Secondary | ICD-10-CM | POA: Diagnosis not present

## 2021-08-08 DIAGNOSIS — M7671 Peroneal tendinitis, right leg: Secondary | ICD-10-CM | POA: Diagnosis not present

## 2021-08-08 DIAGNOSIS — E1142 Type 2 diabetes mellitus with diabetic polyneuropathy: Secondary | ICD-10-CM | POA: Diagnosis not present

## 2021-08-08 DIAGNOSIS — M792 Neuralgia and neuritis, unspecified: Secondary | ICD-10-CM | POA: Diagnosis not present

## 2021-08-08 DIAGNOSIS — M7731 Calcaneal spur, right foot: Secondary | ICD-10-CM | POA: Diagnosis not present

## 2021-08-11 ENCOUNTER — Telehealth: Payer: Self-pay | Admitting: Internal Medicine

## 2021-08-11 NOTE — Progress Notes (Signed)
  Chronic Care Management   Note  08/11/2021 Name: SIMRAN MANNIS MRN: [id number] DOB: October 21, 1940  Quin Hoop Mccorkle is a 81 y.o. year old male who is a primary care patient of Unk Pinto, MD. I reached out to Laurey Arrow by phone today in response to a referral sent by Mr. Lyrick Lagrand Villard's PCP, Unk Pinto, MD.   Mr. Tober was given information about Chronic Care Management services today including:  CCM service includes personalized support from designated clinical staff supervised by his physician, including individualized plan of care and coordination with other care providers 24/7 contact phone numbers for assistance for urgent and routine care needs. Service will only be billed when office clinical staff spend 20 minutes or more in a month to coordinate care. Only one practitioner may furnish and bill the service in a calendar month. The patient may stop CCM services at any time (effective at the end of the month) by phone call to the office staff.   Patient agreed to services and verbal consent obtained.   Follow up [place]

## 2021-08-14 ENCOUNTER — Encounter: Payer: Self-pay | Admitting: Internal Medicine

## 2021-08-21 ENCOUNTER — Telehealth: Payer: Self-pay

## 2021-08-21 NOTE — Telephone Encounter (Signed)
LM-08/21/21-Calling pt. To confirm CP initial visit for 6/27 at 11:00AM. Spoke to pts. Wife and she requested to r/s because they are going out of town and won't be available. Pt. Will call when he returns from his trip to r/s.  Total time spent: 4 min.

## 2021-08-26 ENCOUNTER — Telehealth: Payer: Medicare Other | Admitting: Pharmacy Technician

## 2021-09-05 ENCOUNTER — Telehealth: Payer: Self-pay

## 2021-09-05 NOTE — Telephone Encounter (Signed)
LM-09/05/21-Called pt. To r/s initial CP visit. Unable to reach. [place].  Total time spent: 2 min.

## 2021-09-08 NOTE — Telephone Encounter (Signed)
LM-09/08/21-Called pt. To schedule CP initial visit. Unable to reach pt. [place] X2.  Total time spent: 2 min.

## 2021-09-11 NOTE — Telephone Encounter (Signed)
LM-09/11/21-Calling pt. To schedule CP initial visit. Unable to reach pt. [place] X3.  Total time spent: 2 min.

## 2021-09-29 DIAGNOSIS — R3914 Feeling of incomplete bladder emptying: Secondary | ICD-10-CM | POA: Diagnosis not present

## 2021-09-29 DIAGNOSIS — Z87442 Personal history of urinary calculi: Secondary | ICD-10-CM | POA: Diagnosis not present

## 2021-10-01 ENCOUNTER — Other Ambulatory Visit: Payer: Self-pay | Admitting: Nurse Practitioner

## 2021-10-01 ENCOUNTER — Encounter: Payer: Self-pay | Admitting: Internal Medicine

## 2021-10-01 ENCOUNTER — Other Ambulatory Visit: Payer: Self-pay | Admitting: Internal Medicine

## 2021-10-01 MED ORDER — LOSARTAN POTASSIUM 100 MG PO TABS: ORAL_TABLET | ORAL | 3 refills | Status: DC

## 2021-10-01 NOTE — Patient Instructions (Signed)

## 2021-10-01 NOTE — Progress Notes (Unsigned)
Annual  Screening/Preventative Visit  & Comprehensive Evaluation & Examination  Future Appointments  Date Time Provider Department  10/02/2021                    cpe 11:00 AM Unk Pinto, MD [facility]        This very nice 81 y.o.  MWM  presents for a Screening /Preventative Visit & comprehensive evaluation and management of multiple medical co-morbidities.  Patient has been followed for HTN, HLD, T2_NIDDM  and Vitamin D Deficiency. Patient has moderate Obesity 36.15.      HTN predates circa 2002. Patient's BP has been controlled at home.  Today's BP is at goal - 137/85 .  Lumbar CT scan found Aortic Atherosclerosis.  In 11/2019.  Patient denies any cardiac symptoms as chest pain, palpitations, shortness of breath, dizziness or ankle swelling.      Patient's hyperlipidemia is controlled with diet and medications. Patient denies myalgias or other medication SE's. Last lipids were at goal:  Lab Results  Component Value Date   CHOL 161 02/06/2019   HDL 46 02/06/2019   LDLCALC 95 02/06/2019   TRIG 104 02/06/2019   CHOLHDL 3.5 02/06/2019        Patient has hx/o  Moderate Obesity with BMI 36.15  and PreDiabetes (2010) transitioning to T2_DM (A1c 6.8% /Feb 2019)  w/CKD3a (GFR 48) attempting diet control.   Patient denies reactive hypoglycemic symptoms, visual blurring, diabetic polys or paresthesias. Last A1c was not at goal:   Lab Results  Component Value Date   HGBA1C 6.5 (H) 12/05/2019         Finally, patient has history of Vitamin D Deficiency ("36" /2009) and last vitamin D was at goal:   Lab Results  Component Value Date   VD25OH 89.91 09/05/2019    Current Outpatient Medications on File Prior to Visit  Medication Sig   aspirin EC 81 MG tablet Restart on 12/11/2019   b complex vitamins tablet Take 1 tablet daily.   bisoprolol-hctz 5-6.25 MG tablet Take 1 tablet Daily for BP   VITAMIN D 5,000 Units Take   daily.   diclofenac  1 % GEL As needed   fenofibrate  145  MG tablet TAKE 1 TABLET DAILY    finasteride  5 MG tablet Take  daily.   FLAX SEEDS 1,000 mg  Take  daily.    gabapentin  300 MG capsule Take 300 mg 3 times daily.   losartan100 MG tablet TAKE 1 TABLET  DAILY    Magnesium Oxide 250 MG TABS Take  2 ( times daily.    meloxicam 15 MG tablet TAKE 1/2 TO 1 TABLET DAILY    methocarbamol 500 MG Take 1 tablet 3 times daily.   Omega-3 FISH OIL 1200 MG CAPS Take daily   pantoprazole 40 MG tablet TAKE 1 TABLET  TWICE A DAY.    sildenafil 100 MG tablet Take  daily as needed for erectile dysfunction.   tamsulosin 0.4 MG CAPS capsule Take daily after breakfast.    vitamin B-12 1000 MCG tablet Take 1 tablet  daily.    Allergies  Allergen Reactions   Iodinated Diagnostic Agents Shortness Of Breath   Iohexol Shortness Of Breath    Past Medical History:  Diagnosis Date   Arthritis    in left hip   ED (erectile dysfunction)    Enlarged prostate with lower urinary tract symptoms (LUTS)    GERD (gastroesophageal reflux disease)    History of  chronic gastritis    History of colon polyps    hyperplastic 2006   History of esophageal stricture    S/P  DILATATION 2009; 2010; 2010 2012   History of kidney stones    hx. multiple kidney stones   History of kidney stones    History of primary hyperparathyroidism    s/p  left superior parathyroidectomy 07-10-2014   Hyperlipidemia    Hypertension    Ileus ([place]) 05/02/2017   Left ureteral stone    Pre-diabetes    Sepsis secondary to UTI ([place]) 05/02/2017   Health Maintenance  Topic Date Due   Hepatitis C Screening  Never done   OPHTHALMOLOGY EXAM  02/11/2019   INFLUENZA VACCINE  10/01/2019   FOOT EXAM  02/05/2020   COLONOSCOPY (Pts 45-66yr Insurance coverage will need to be confirmed)  02/13/2020   COVID-19 Vaccine (2 - Pfizer 3-dose series) 02/21/2020   HEMOGLOBIN A1C  06/04/2020   TETANUS/TDAP  09/23/2025   PNA vac Low Risk Adult  Completed   Immunization History  Administered Date(s)  Administered   Influenza Split 12/27/2012   Influenza, High Dose  01/12/2017, 01/13/2018   Influenza 12/16/2016, 01/12/2017   PFIZER-SARS-COV-2 Vacc 01/31/2020   Pneumococcal -13 03/25/2015   Pneumococcal -23 03/02/2008   Td 03/02/2005, 09/24/2015   Zoster 03/02/2006   02/12/2017 - Last Colon - Dr NSilverio Decamp- Recc 3 year f/u due Jan 2022  Past Surgical History:  Procedure Laterality Date   BACK SURGERY  04/2018   fusion L2 and L3   COLONOSCOPY  02/17/2005   CYSTO/  LEFT RETROGRADE PYELOGRAM/ STENT PLACEMENT  01/26/2005   CYSTOSCOPY W/ URETEROSCOPY W/ LITHOTRIPSY Left 05/04/2005   CYSTOSCOPY WITH STENT PLACEMENT Left 12/30/2015   Procedure: CYSTOSCOPY WITH STENT PLACEMENT;  Surgeon: SFranchot Gallo MD;  [facility]  Service: Urology;  Laterality: Left;   CYSTOSCOPY/RETROGRADE/URETEROSCOPY/STONE EXTRACTION WITH BASKET Left 12/30/2015   Procedure: CYSTOSCOPY/RETROGRADE/URETEROSCOPY/STONE EXTRACTION WITH BASKET;  Surgeon: SFranchot Gallo MD;  [facility]  Service: Urology;  Laterality: Left;   CYSTOSCOPY/URETEROSCOPY/HOLMIUM LASER/STENT PLACEMENT Left 03/09/2016   Procedure: CYSTOSCOPY/RETROGRADE PYELOGRAM/URETEROSCOPY/BASKET STONE EXTRACTION/STENT PLACEMENT;  Surgeon: SFranchot Gallo MD;  [facility];  Service: Urology;  Laterality: Left;   ESOPHAGOGASTRODUODENOSCOPY (EGD) WITH ESOPHAGEAL DILATION  x4  last one 07-21-2010   HOLMIUM LASER APPLICATION Left 153/61/4431  Procedure: HOLMIUM LASER APPLICATION;  Surgeon: SFranchot Gallo MD;  [facility]  Service: Urology;  Laterality: Left;   INGUINAL HERNIA REPAIR Left yrs ago   LUMBAR LAMINECTOMY/DECOMPRESSION MICRODISCECTOMY Left 11/09/2017   Procedure: Left Lumbar Two-Three Microdiscectomy;  Surgeon: EKristeen Miss MD;  [facility]  Service: Neurosurgery;  Laterality: Left;  Left Lumbar Two-Three Microdiscectomy   NEPHROLITHOTOMY Left 02/01/2014    Procedure: NEPHROLITHOTOMY PERCUTANEOUS;  Surgeon: SJorja Loa MD;  [facility];  Service: Urology;  Laterality: Left;   ORIF HIP FRACTURE Left 04/16/2017   Procedure: OPEN REDUCTION INTERNAL FIXATION HIP GREATER TROCHANTER;  Surgeon: RFrederik Pear MD;  [facility]  Service: Orthopedics;  Laterality: Left;   PARATHYROIDECTOMY Left 07/10/2014   Procedure: LEFT SUPERIOR PARATHYROIDECTOMY;  Surgeon: TArmandina Gemma MD;  [facility]  Service: General;  Laterality: Left;   POSTERIOR LUMBAR FUSION  09/07/2019   TOTAL HIP ARTHROPLASTY Left 06/24/2016   Procedure: LEFT TOTAL HIP ARTHROPLASTY ANTERIOR APPROACH;  Surgeon: FGaynelle Arabian MD;  [facility];  Service: Orthopedics;  Laterality: Left;   TOTAL HIP REVISION Left 11/18/2016   Procedure: Left  femoral revision - posterior approach;  Surgeon: Gaynelle Arabian, MD;  [facility];  Service: Orthopedics;  Laterality: Left;   tumor ear Left age 53    topical growth behind left ear.   Family History  Problem Relation Age of Onset   Heart disease Mother    Alzheimer's disease Mother    Heart disease Father    Bladder Cancer Sister    Colon cancer Neg Hx    Esophageal cancer Neg Hx    Stomach cancer Neg Hx    Rectal cancer Neg Hx    Social History   Socioeconomic History   Marital status: Married    Spouse name: Vaughan Basta   Number of children: 2  Occupational History   Occupation: real estate business  Tobacco Use   Smoking status: Former Smoker    Years: 8.00    Quit date: 03/02/1974    Years since quitting: 46.1   Smokeless tobacco: Never Used  Vaping Use   Vaping Use: Never used  Substance and Sexual Activity   Alcohol use: Not Currently    Alcohol/week: 1.0 - 2.0 standard drink    Types: 1 - 2 Glasses of wine per week   Drug use: No   Sexual activity: Yes   ROS Constitutional: Denies fever, chills, weight loss/gain, headaches, insomnia,  night sweats or change in appetite. Does c/o fatigue. Eyes: Denies  redness, blurred vision, diplopia, discharge, itchy or watery eyes.  ENT: Denies discharge, congestion, post nasal drip, epistaxis, sore throat, earache, hearing loss, dental pain, Tinnitus, Vertigo, Sinus pain or snoring.  Cardio: Denies chest pain, palpitations, irregular heartbeat, syncope, dyspnea, diaphoresis, orthopnea, PND, claudication or edema Respiratory: denies cough, dyspnea, DOE, pleurisy, hoarseness, laryngitis or wheezing.  Gastrointestinal: Denies dysphagia, heartburn, reflux, water brash, pain, cramps, nausea, vomiting, bloating, diarrhea, constipation, hematemesis, melena, hematochezia, jaundice or hemorrhoids Genitourinary: Denies dysuria, frequency, discharge, hematuria or flank pain. Has urgency, nocturia x 2-3 & occasional hesitancy. Musculoskeletal: Denies arthralgia, myalgia, stiffness, Jt. Swelling, pain, limp or strain/sprain. Denies Falls. Skin: Denies puritis, rash, hives, warts, acne, eczema or change in skin lesion Neuro: No weakness, tremor, incoordination, spasms, paresthesia or pain Psychiatric: Denies confusion, memory loss or sensory loss. Denies Depression. Endocrine: Denies change in weight, skin, hair change, nocturia, and paresthesia, diabetic polys, visual blurring or hyper / hypo glycemic episodes.  Heme/Lymph: No excessive bleeding, bruising or enlarged lymph nodes.  Physical Exam  BP 137/85   Pulse 65   Temp (!) 96.9 F (36.1 C)   Resp 12   Ht '5\' 10"'$  (1.778 m)   Wt 249 lb 12.8 oz (113.3 kg)   SpO2 99%   BMI 35.84 kg/m   General Appearance: Over  nourished and and in no apparent distress.  Eyes: PERRLA, EOMs, conjunctiva no swelling or erythema, normal fundi and vessels. Sinuses: No frontal/maxillary tenderness ENT/Mouth: EACs patent / TMs  nl. Nares clear without erythema, swelling, mucoid exudates. Oral hygiene is good. No erythema, swelling, or exudate. Tongue normal, non-obstructing. Tonsils not swollen or erythematous. Hearing normal.   Neck: Supple, thyroid not palpable. No bruits, nodes or JVD. Respiratory: Respiratory effort normal.  BS equal and clear bilateral without rales, rhonci, wheezing or stridor. Cardio: Heart sounds are normal with regular rate and rhythm and no murmurs, rubs or gallops. Peripheral pulses are normal and equal bilaterally without edema. No aortic or femoral bruits. Chest: symmetric with normal excursions and percussion.  Abdomen: Soft, obese with Nl bowel sounds. Nontender, no guarding, rebound, hernias, masses, or  organomegaly.  Lymphatics: Non tender without lymphadenopathy.  Musculoskeletal: Full ROM all peripheral extremities, joint stability, 5/5 strength, and normal gait. Skin: Warm and dry without rashes, lesions, cyanosis, clubbing or  ecchymosis.  Neuro: Cranial nerves intact, reflexes equal bilaterally. Normal muscle tone, no cerebellar symptoms. Sensation intact to touch, vibratory and Monofilament to the toes bilaterally. Pysch: Alert and oriented X 3 with normal affect, insight and judgment appropriate.   Assessment and Plan  1. Annual Preventative/Screening Exam    2. Class 2 severe obesity due to excess calories with serious  comorbidity and body mass index (BMI) of 36.0 to 36.9 in adult [facility])  - EKG 12-Lead - [place], RETROPERITNL ABD,  LTD  3. Essential hypertension  - EKG 12-Lead - [place], RETROPERITNL ABD,  LTD - Urinalysis, Routine w reflex microscopic - Microalbumin / creatinine urine ratio  4. Hyperlipidemia associated with type 2 diabetes mellitus ([place])  - EKG 12-Lead - [place], RETROPERITNL ABD,  LTD - Lipid panel  5. Type 2 diabetes mellitus with stage 3a chronic kidney  disease, without long-term current use of insulin ([facility])  - EKG 12-Lead - [place], RETROPERITNL ABD,  LTD - Urinalysis, Routine w reflex microscopic - Microalbumin / creatinine urine ratio - HM DIABETES FOOT EXAM - PR LOW EXTEMITY NEUR EXAM DOCUM  6. Aortic atherosclerosis ([facility]) by Lumbar CT scan on  11/22/2019  - EKG 12-Lead - Lipid panel  7. Vitamin D deficiency  - VITAMIN D 25 Hydroxy  8. Hyperparathyroidism, primary ([place])  - PTH, intact and calcium - COMPLETE METABOLIC PANEL WITH GFR  9. BPH with obstruction/lower urinary tract symptoms  - PSA  10. Prostate cancer screening  - PSA  11. Screening for heart disease  - EKG 12-Lead  12. FHx: heart disease  - EKG 12-Lead - [place], RETROPERITNL ABD,  LTD  13. Former smoker  - EKG 12-Lead - [place], RETROPERITNL ABD,  LTD  14. Screening for AAA (aortic abdominal aneurysm)  - [place], RETROPERITNL ABD,  LTD  15. Medication management  - Urinalysis, Routine w reflex microscopic - Microalbumin / creatinine urine ratio - CBC with Differential/Platelet - COMPLETE METABOLIC PANEL WITH GFR - Magnesium - Lipid panel - TSH - Hemoglobin A1c - Insulin, random - VITAMIN D 25 Hydroxy   16. Screening for colorectal cancer  - POC Hemoccult Bld/Stl              Patient was counseled in prudent diet, weight control to achieve/maintain BMI less than 25, BP monitoring, regular exercise and medications as discussed.  Discussed med effects and SE's. Routine screening labs and tests as requested with regular follow-up as recommended. Over 40 minutes of exam, counseling, chart review and high complex critical decision making was performed   Kirtland Bouchard, MD

## 2021-10-02 ENCOUNTER — Encounter: Payer: Self-pay | Admitting: Internal Medicine

## 2021-10-02 ENCOUNTER — Ambulatory Visit (INDEPENDENT_AMBULATORY_CARE_PROVIDER_SITE_OTHER): Payer: Medicare Other | Admitting: Internal Medicine

## 2021-10-02 VITALS — BP 137/85 | HR 65 | Temp 96.9°F | Resp 12 | Ht 70.0 in | Wt 249.8 lb

## 2021-10-02 DIAGNOSIS — N1831 Chronic kidney disease, stage 3a: Secondary | ICD-10-CM | POA: Diagnosis not present

## 2021-10-02 DIAGNOSIS — Z87891 Personal history of nicotine dependence: Secondary | ICD-10-CM | POA: Diagnosis not present

## 2021-10-02 DIAGNOSIS — Z136 Encounter for screening for cardiovascular disorders: Secondary | ICD-10-CM | POA: Diagnosis not present

## 2021-10-02 DIAGNOSIS — Z79899 Other long term (current) drug therapy: Secondary | ICD-10-CM | POA: Diagnosis not present

## 2021-10-02 DIAGNOSIS — E210 Primary hyperparathyroidism: Secondary | ICD-10-CM

## 2021-10-02 DIAGNOSIS — Z8249 Family history of ischemic heart disease and other diseases of the circulatory system: Secondary | ICD-10-CM

## 2021-10-02 DIAGNOSIS — I700 Atherosclerosis of aorta: Secondary | ICD-10-CM | POA: Diagnosis not present

## 2021-10-02 DIAGNOSIS — Z125 Encounter for screening for malignant neoplasm of prostate: Secondary | ICD-10-CM

## 2021-10-02 DIAGNOSIS — Z0000 Encounter for general adult medical examination without abnormal findings: Secondary | ICD-10-CM | POA: Diagnosis not present

## 2021-10-02 DIAGNOSIS — I10 Essential (primary) hypertension: Secondary | ICD-10-CM | POA: Diagnosis not present

## 2021-10-02 DIAGNOSIS — N138 Other obstructive and reflux uropathy: Secondary | ICD-10-CM

## 2021-10-02 DIAGNOSIS — E1122 Type 2 diabetes mellitus with diabetic chronic kidney disease: Secondary | ICD-10-CM | POA: Diagnosis not present

## 2021-10-02 DIAGNOSIS — E559 Vitamin D deficiency, unspecified: Secondary | ICD-10-CM

## 2021-10-02 DIAGNOSIS — Z1211 Encounter for screening for malignant neoplasm of colon: Secondary | ICD-10-CM

## 2021-10-02 DIAGNOSIS — Z0001 Encounter for general adult medical examination with abnormal findings: Secondary | ICD-10-CM

## 2021-10-02 DIAGNOSIS — E1169 Type 2 diabetes mellitus with other specified complication: Secondary | ICD-10-CM

## 2021-10-03 LAB — COMPLETE METABOLIC PANEL WITH GFR
AG Ratio: 1.4 (calc) (ref 1.0–2.5)
ALT: 15 U/L (ref 9–46)
AST: 19 U/L (ref 10–35)
Albumin: 4.3 g/dL (ref 3.6–5.1)
Alkaline phosphatase (APISO): 39 U/L (ref 35–144)
BUN/Creatinine Ratio: 14 (calc) (ref 6–22)
BUN: 25 mg/dL (ref 7–25)
CO2: 24 mmol/L (ref 20–32)
Calcium: 9.8 mg/dL (ref 8.6–10.3)
Chloride: 101 mmol/L (ref 98–110)
Creat: 1.74 mg/dL — ABNORMAL HIGH (ref 0.70–1.28)
Globulin: 3 g/dL (calc) (ref 1.9–3.7)
Glucose, Bld: 114 mg/dL — ABNORMAL HIGH (ref 65–99)
Potassium: 4.6 mmol/L (ref 3.5–5.3)
Sodium: 138 mmol/L (ref 135–146)
Total Bilirubin: 0.6 mg/dL (ref 0.2–1.2)
Total Protein: 7.3 g/dL (ref 6.1–8.1)
eGFR: 40 mL/min/{1.73_m2} — ABNORMAL LOW (ref >=60)

## 2021-10-03 LAB — HEMOGLOBIN A1C
Hgb A1c MFr Bld: 5.9 % of total Hgb — ABNORMAL HIGH (ref <5.7)
Mean Plasma Glucose: 123 mg/dL
eAG (mmol/L): 6.8 mmol/L

## 2021-10-03 LAB — MAGNESIUM: Magnesium: 1.8 mg/dL (ref 1.5–2.5)

## 2021-10-03 LAB — URINALYSIS, ROUTINE W REFLEX MICROSCOPIC
Bilirubin Urine: NEGATIVE
Glucose, UA: NEGATIVE
Hgb urine dipstick: NEGATIVE
Ketones, ur: NEGATIVE
Leukocytes,Ua: NEGATIVE
Nitrite: NEGATIVE
Protein, ur: NEGATIVE
Specific Gravity, Urine: 1.014 (ref 1.001–1.035)
pH: 5.5 (ref 5.0–8.0)

## 2021-10-03 LAB — CBC WITH DIFFERENTIAL/PLATELET
Absolute Monocytes: 456 cells/uL (ref 200–950)
Basophils Absolute: 48 cells/uL (ref 0–200)
Basophils Relative: 0.9 %
Eosinophils Absolute: 339 cells/uL (ref 15–500)
Eosinophils Relative: 6.4 %
HCT: 43.6 % (ref 38.5–50.0)
Hemoglobin: 14.6 g/dL (ref 13.2–17.1)
Lymphs Abs: 1357 cells/uL (ref 850–3900)
MCH: 30.2 pg (ref 27.0–33.0)
MCHC: 33.5 g/dL (ref 32.0–36.0)
MCV: 90.1 fL (ref 80.0–100.0)
MPV: 10.9 fL (ref 7.5–12.5)
Monocytes Relative: 8.6 %
Neutro Abs: 3101 cells/uL (ref 1500–7800)
Neutrophils Relative %: 58.5 %
Platelets: 285 10*3/uL (ref 140–400)
RBC: 4.84 10*6/uL (ref 4.20–5.80)
RDW: 12.5 % (ref 11.0–15.0)
Total Lymphocyte: 25.6 %
WBC: 5.3 10*3/uL (ref 3.8–10.8)

## 2021-10-03 LAB — MICROALBUMIN / CREATININE URINE RATIO
Creatinine, Urine: 95 mg/dL (ref 20–320)
Microalb Creat Ratio: 4 mcg/mg creat (ref <30)
Microalb, Ur: 0.4 mg/dL

## 2021-10-03 LAB — VITAMIN D 25 HYDROXY (VIT D DEFICIENCY, FRACTURES): Vit D, 25-Hydroxy: 67 ng/mL (ref 30–100)

## 2021-10-03 LAB — LIPID PANEL
Cholesterol: 190 mg/dL (ref <200)
HDL: 60 mg/dL (ref >=40)
LDL Cholesterol (Calc): 107 mg/dL (calc) — ABNORMAL HIGH
Non-HDL Cholesterol (Calc): 130 mg/dL (calc) — ABNORMAL HIGH (ref <130)
Total CHOL/HDL Ratio: 3.2 (calc) (ref <5.0)
Triglycerides: 123 mg/dL (ref <150)

## 2021-10-03 LAB — PTH, INTACT AND CALCIUM
Calcium: 9.8 mg/dL (ref 8.6–10.3)
PTH: 20 pg/mL (ref 16–77)

## 2021-10-03 LAB — INSULIN, RANDOM: Insulin: 43.2 u[IU]/mL — ABNORMAL HIGH

## 2021-10-03 LAB — TSH: TSH: 2.11 mIU/L (ref 0.40–4.50)

## 2021-10-03 LAB — PSA: PSA: 0.87 ng/mL (ref <=4.00)

## 2021-10-03 NOTE — Progress Notes (Signed)
<><><><><><><><><><><><><><><><><><><><><><><><><><><><><><><><><> <><><><><><><><><><><><><><><><><><><><><><><><><><><><><><><><><>  -  Kidney Functions still Stage 3b - &  Stable  - Great !  <><><><><><><><><><><><><><><><><><><><><><><><><><><><><><><><><> <><><><><><><><><><><><><><><><><><><><><><><><><><><><><><><><><>  -  Total  Chol =  190     - is   Elevated             (  Ideal  or  Goal is less than 180  !  )  & -  Bad / Dangerous LDL  Chol =    107  - also very Elevated   !              (  Ideal  or  Goal is less than 70  !  )   - Cholesterol is too high                    - Recommend a stricter low cholesterol diet & WEIGHT LOSS  - Cholesterol only comes from animal sources  - ie. meat, dairy, egg yolks  - Eat all the vegetables you want.  - Avoid Meat, Avoid Meat,  Avoid Meat - especially Red Meat - Beef AND Pork .  - Avoid cheese & dairy - milk & ice cream.     - Cheese is the most concentrated form of trans-fats which                                                                is the worst thing to clog up our arteries.   - Veggie cheese is OK which can be found in the fresh                              produce section at [employer] or [employer] or [employer] <><><><><><><><><><><><><><><><><><><><><><><><><><><><><><><><><> <><><><><><><><><><><><><><><><><><><><><><><><><><><><><><><><><>  -  A1c is a little better                     - Down from 6.5% & 6.1% to now 5.9%                                                          - slowly making progress                                                                           - goal is less than 5.7%  <><><><><><><><><><><><><><><><><><><><><><><><><><><><><><><><><> <><><><><><><><><><><><><><><><><><><><><><><><><><><><><><><><><>  - PSA is Low - Great   !  <><><><><><><><><><><><><><><><><><><><><><><><><><><><><><><><><> <><><><><><><><><><><><><><><><><><><><><><><><><><><><><><><><><>  -  PTH  is hormone that regulates calcium balance & is a sign of bad Kidneys                                                                          -  Fortunately yours is Normal  - Great   !  <><><><><><><><><><><><><><><><><><><><><><><><><><><><><><><><><> <><><><><><><><><><><><><><><><><><><><><><><><><><><><><><><><><>  -   Magnesium  -     -  very  low- goal is betw 2.0 - 2.5,   - So..............Marland Kitchen  Recommend that you take  2 tablets of  Magnesium 500 mg / Daily   - also important to eat lots of  leafy green vegetables   - spinach - Kale - collards - greens - okra - asparagus  - broccoli - quinoa - squash - almonds   - black, red, white beans,   peas - green beans <><><><><><><><><><><><><><><><><><><><><><><><><><><><><><><><><> <><><><><><><><><><><><><><><><><><><><><><><><><><><><><><><><><>  -  Vitamin DE = 67 -  Excellent - Please keep dose same  <><><><><><><><><><><><><><><><><><><><><><><><><><><><><><><><><> <><><><><><><><><><><><><><><><><><><><><><><><><><><><><><><><><>  -  All Else - CBC - Kidneys - Electrolytes - Liver - Magnesium & Thyroid    - all  Normal / OK <><><><><><><><><><><><><><><><><><><><><><><><><><><><><><><><><> <><><><><><><><><><><><><><><><><><><><><><><><><><><><><><><><><>  -  Work harder on diet & weight loss & keep up the good work !   -  May the Rest of your Life, be the Best of your Life  !  <><><><><><><><><><><><><><><><><><><><><><><><><><><><><><><><><> <><><><><><><><><><><><><><><><><><><><><><><><><><><><><><><><><>

## 2021-10-08 DIAGNOSIS — N189 Chronic kidney disease, unspecified: Secondary | ICD-10-CM | POA: Diagnosis not present

## 2021-10-08 DIAGNOSIS — M7660 Achilles tendinitis, unspecified leg: Secondary | ICD-10-CM | POA: Diagnosis not present

## 2021-10-08 DIAGNOSIS — M25471 Effusion, right ankle: Secondary | ICD-10-CM | POA: Diagnosis not present

## 2021-10-08 DIAGNOSIS — M1990 Unspecified osteoarthritis, unspecified site: Secondary | ICD-10-CM | POA: Diagnosis not present

## 2021-10-08 DIAGNOSIS — M549 Dorsalgia, unspecified: Secondary | ICD-10-CM | POA: Diagnosis not present

## 2021-10-08 DIAGNOSIS — M79671 Pain in right foot: Secondary | ICD-10-CM | POA: Diagnosis not present

## 2021-10-08 DIAGNOSIS — M25571 Pain in right ankle and joints of right foot: Secondary | ICD-10-CM | POA: Diagnosis not present

## 2021-10-08 DIAGNOSIS — M2550 Pain in unspecified joint: Secondary | ICD-10-CM | POA: Diagnosis not present

## 2021-10-16 ENCOUNTER — Telehealth: Payer: Self-pay
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
## 2021-11-10 DIAGNOSIS — L820 Inflamed seborrheic keratosis: Secondary | ICD-10-CM | POA: Diagnosis not present

## 2021-12-22 ENCOUNTER — Encounter: Payer: Self-pay | Admitting: Internal Medicine

## 2022-01-15 NOTE — Progress Notes (Signed)
MEDICARE ANNUAL WELLNESS VISIT AND FOLLOW UP  Assessment:    Annual Medicare Wellness Visit Due annually  Health maintenance reviewed  Atherosclerosis of aorta ([place]) - Per CT 2021 Control blood pressure, cholesterol, glucose, increase exercise.    Essential hypertension Continue current medications Monitor blood pressure at home; call if consistently over 130/80 Continue DASH diet.   Reminder to go to the ER if any CP, SOB, nausea, dizziness, severe HA, changes vision/speech, left arm numbness and tingling and jaw pain. - CBC with Differential/Platelet - CMP/GFR - TSH  Hyperlipidemia associated with type 2 diabetes mellitus (HCC) Statin myopathy Continue fenofibrate, consider zetia Discussed dietary and exercise modifications Low fat diet - lipid panel   Hyperparathyroidism, primary (HCC) S/p partial resection Monitor, recent normal calcium/ PTH on 10/02/21 - TSH  Type 2 diabetes mellitus with stage 3b chronic kidney disease, without long-term current use of insulin (HCC) Controlled by diet & exercise Continue to monitor Check skin and feet regularly - A1C  Stage 3b chronic kidney disease associated with T2DM (HCC) Increase fluids  Avoid NSAIDS - reviewed in detail today, stop meloxicam, aleve - tylenol is ok Blood pressure control Monitor sugars  Will continue to monitor, refer nephrology if trending down further -CMP, CBC  ED associated with T2DM ([place]) Uro follows; sildenafil PRN  Other secondary osteoarthritis of left hip S/p failed arthroplasty Follows with Dr Mayer Camel Discussed stop meloxicam due to renal, try tylenol, gabapentin  Obesity - BMI 34 Long discussion about weight loss, diet, and exercise Discussed final goal weight  Patient didn't tolerate topamax, has also tried phentermine;  Currently making good progress with lifestyle continue close follow up.   Kidney stones Continue to monitor, push fluids Has seen [facility]  Gastroesophageal  reflux disease, unspecified whether esophagitis present Hx of stricture s/p dilation, no recurrenct on last EGD Well managed on current medications: Protonix 40 mg QD Discussed diet, avoiding triggers and other lifestyle changes  Medication management Continued  Vitamin D deficiency Continue Vit D supplementation to maintain value in therapeutic level of 60-100  Spinal Stenosis lumbar Continue to follow with Dr Ellene Route  Osteoarthritis of left hip Continue medications Follow with Dr. Mayer Camel  Vitamin B12 deficiency  Continue supplementation  History of adenomatous polyp of colon - Colonoscopy UTD done 03/06/21- no further required   Over 30 minutes of face to face interview, exam, counseling, chart review, and critical decision making was performed  Future Appointments  Date Time Provider [place]  04/21/2022 10:30 AM Unk Pinto, MD [facility] None  10/07/2022 11:00 AM Unk Pinto, MD [facility] None  01/20/2023 11:00 AM Alycia Rossetti, NP [facility] None     Plan:   During the course of the visit the patient was educated and counseled about appropriate screening and preventive services including:   Pneumococcal vaccine  Influenza vaccine Prevnar 13 Td vaccine Screening electrocardiogram Colorectal cancer screening Diabetes screening Glaucoma screening Nutrition counseling    Subjective:  [name] [name] is a 81 y.o. male who presents for Medicare Annual Wellness Visit. He has Hypertension; GERD (gastroesophageal reflux disease); Kidney stones; Obesity (BMI 30.0-34.9); Vitamin D deficiency; Medication management; Hyperparathyroidism, primary ([place]); CKD stage 3 due to type 2 diabetes mellitus ([place]); OA (osteoarthritis) of hip; Normocytic anemia; Vitamin B12 deficiency; Herniated nucleus pulposus, L2-3 left; Spinal stenosis; Hyperlipidemia associated with type 2 diabetes mellitus ([place]); Fixation hardware in spine; Aortic atherosclerosis (HCC) by Lumbar CT  scan on 11/22/2019; Type 2 diabetes mellitus with stage 3a chronic kidney disease, without  long-term current use of insulin ([place]); History of adenomatous polyp of colon; Erectile dysfunction associated with type 2 diabetes mellitus ([place]); Statin myopathy; and Dysphagia on their problem list.   He has had multiple surgeries on L hip for failed arthroplasty, Ortho unfortunately suspects this will be permanent. Follow with Dr. Mayer Camel. He will do some exercises at the [facility]  He is followed by Dr. Ellene Route for Spinal stenosis of lumbar region and Dr Kathlene November for multiple joint pain.He is on Gabapentin 300 mg BID and Percocet 5/325 mg 1 daily and Celebrex 1 QD for pain.  BMI is Body mass index is 36.19 kg/m., he is not working on diet and exercise.  Didn't tolerate phentermine/topamax (sedation, likely due to topamax).  Wt Readings from Last 3 Encounters:  01/19/22 252 lb 3.2 oz (114.4 kg)  10/02/21 249 lb 12.8 oz (113.3 kg)  03/20/21 259 lb 3.2 oz (117.6 kg)   His blood pressure has been controlled at home with Losartan 100 mg QD and Ziac 6/6.25mg QD, today their BP is BP: 118/72  BP Readings from Last 3 Encounters:  01/19/22 118/72  10/02/21 137/85  03/20/21 140/80  He does workout. He denies chest pain, shortness of breath, dizziness.   He has aortic atherosclerosis per CT 11/2019.   He is on cholesterol medication (fenofibrate, fish oil), hx of statin myalgias reported (remote, can't recall agents, numerous per patient). His cholesterol is not at goal. The cholesterol last visit was:   Lab Results  Component Value Date   CHOL 190 10/02/2021   HDL 60 10/02/2021   LDLCALC 107 (H) 10/02/2021   TRIG 123 10/02/2021   CHOLHDL 3.2 10/02/2021   He has been working on diet and exercise for T2DM with CKD 3, and denies paresthesia of the feet, polydipsia, polyuria and visual disturbances.Has not been checking his blood sugars. Not on any current medication. Last A1C in the office was:  Lab Results   Component Value Date   HGBA1C 5.9 (H) 10/02/2021   He has had CKD III for several years, monitoring. Does take NSAIDS, was unable to stop Celebrex for pain.  He is drinking 64 ounces of water. Also hx of recurrent renal stones, follows [facility]. Last GFR  Lab Results  Component Value Date   EGFR 40 (L) 10/02/2021   Lab Results  Component Value Date   GFRNONAA 38 (L) 04/24/2020   GFRNONAA 48 (L) 12/07/2019   GFRNONAA 45 (L) 12/06/2019   Patient is on Vitamin D supplement.   Lab Results  Component Value Date   VD25OH 52 10/02/2021     He has hx of primary hyperparathyroid, s/p parathyroidectomy by Dr. Harlow Asa with recent normal calcium/PTH:   Lab Results  Component Value Date   PTH 20 10/02/2021   CALCIUM 9.8 10/02/2021   CALCIUM 9.8 10/02/2021   CAION 1.15 12/05/2019   PHOS 3.8 10/03/2020     Medication Review: Current Outpatient Medications on File Prior to Visit  Medication Sig Dispense Refill   aspirin EC 81 MG tablet Restart on 12/11/2019 1 tablet 0   b complex vitamins tablet Take 1 tablet by mouth daily.     bisoprolol-hydrochlorothiazide (ZIAC) 5-6.25 MG tablet TAKE 1 TABLET BY MOUTH ONCE DAILY IN THE MORNING FOR BLOOD PRESSURE 90 tablet 1   CELEBREX 200 MG capsule TAKE 1 CAPSULE BY MOUTH EVERY DAY AS NEEDED     cholecalciferol (VITAMIN D3) 25 MCG (1000 UNIT) tablet Take 1,000 Units by mouth daily.  fenofibrate (TRICOR) 145 MG tablet TAKE 1 TABLET DAILY FOR TRIGLYCERIDES (BLOOD FATS) 90 tablet 3   finasteride (PROSCAR) 5 MG tablet Take 5 mg by mouth daily.     gabapentin (NEURONTIN) 300 MG capsule Take 300 mg by mouth 2 (two) times daily.     hydrocortisone 2.5 % cream Apply topically.     losartan (COZAAR) 100 MG tablet Take 1 tablet  Daily for BP                                             /                              TAKE                                  BY                                 MOUTH 90 tablet 3   magnesium oxide (MAG-OX) 400 MG tablet Take  400 mg by mouth daily.     Omega-3 Fatty Acids (FISH OIL) 1200 MG CAPS Restart on 10.11 1 capsule 0   pantoprazole (PROTONIX) 40 MG tablet Take 1 tablet BID for Indigestion & Acid Reflux 180 tablet 3   tamsulosin (FLOMAX) 0.4 MG CAPS capsule Take 0.4 mg by mouth daily after breakfast.   3   Flaxseed, Linseed, (FLAXSEED OIL) 1200 MG CAPS Take 1 capsule by mouth daily. (Patient not taking: Reported on 01/19/2022)     oxyCODONE-acetaminophen (PERCOCET/ROXICET) 5-325 MG tablet Take 1 tablet by mouth every 6 (six) hours as needed.     No current facility-administered medications on file prior to visit.    Current Problems (verified) Patient Active Problem List   Diagnosis Date Noted   Dysphagia 01/08/2021   Type 2 diabetes mellitus with stage 3a chronic kidney disease, without long-term current use of insulin ([place]) 10/03/2020   History of adenomatous polyp of colon 10/03/2020   Erectile dysfunction associated with type 2 diabetes mellitus ([place]) 10/03/2020   Statin myopathy 10/03/2020   Aortic atherosclerosis ([place]) by Lumbar CT scan on 11/22/2019 04/23/2020   Fixation hardware in spine 12/05/2019   Hyperlipidemia associated with type 2 diabetes mellitus ([place]) 09/11/2019   Spinal stenosis 09/05/2019   Herniated nucleus pulposus, L2-3 left 11/09/2017   Normocytic anemia 05/02/2017   Vitamin B12 deficiency 05/02/2017   OA (osteoarthritis) of hip 06/24/2016   CKD stage 3 due to type 2 diabetes mellitus ([place]) 03/25/2015   Hyperparathyroidism, primary ([place]) 07/09/2014   Vitamin D deficiency 06/13/2013   Medication management 06/13/2013   Hypertension    GERD (gastroesophageal reflux disease)    Kidney stones    Obesity (BMI 30.0-34.9)     Screening Tests Immunization History  Administered Date(s) Administered   Influenza Split 12/27/2012   Influenza, High Dose Seasonal PF 01/31/2014, 11/27/2014, 01/12/2017, 01/13/2018, 12/02/2021   Influenza-Unspecified 12/16/2016, 01/12/2017    PFIZER(Purple Top)SARS-COV-2 Vaccination 04/19/2019, 05/10/2019, 01/31/2020, 09/16/2020   Pneumococcal Conjugate-13 03/25/2015   Pneumococcal Polysaccharide-23 03/02/2008   Td 03/02/2005, 09/24/2015   Zoster, Live 03/02/2006   Health Maintenance  Topic Date Due   COVID-19 Vaccine (5 - Pfizer risk  series) 02/04/2022 (Originally 11/11/2020)   Zoster Vaccines- Shingrix (1 of 2) 04/21/2022 (Originally 02/07/1960)   HEMOGLOBIN A1C  04/04/2022   OPHTHALMOLOGY EXAM  07/10/2022   FOOT EXAM  10/02/2022   Diabetic kidney evaluation - GFR measurement  10/03/2022   Diabetic kidney evaluation - Urine ACR  10/03/2022   Medicare Annual Wellness (AWV)  01/20/2023   Pneumonia Vaccine 69+ Years old  Completed   INFLUENZA VACCINE  Completed   HPV VACCINES  Aged Out   COLONOSCOPY (Pts 45-81yr Insurance coverage will need to be confirmed)  Discontinued   Hepatitis C Screening  Discontinued     Names of Other Physician/Practitioners you currently use: 1. [facility] here for primary care 2. Dr. GKaty Fitch eye doctor, last visit 06/2021 3. Dr. PAltamese Jackpot dentist, last visit 09/2021  Patient Care Team: MUnk Pinto MD as PCP - General (Internal Medicine) DNewt Minion MD as Consulting Physician (Orthopedic Surgery) KInda Castle MD (Inactive) as Consulting Physician (Gastroenterology) DFranchot Gallo MD as Consulting Physician (Urology) BCarleene Mains [facility] Pharmacist (Pharmacist)  Allergies Allergies  Allergen Reactions   Iodinated Contrast Media Shortness Of Breath   Iohexol Shortness Of Breath   Statins     Myalgia, with several per patient    SURGICAL HISTORY He  has a past surgical history that includes tumor ear (Left, age 81; Cystoscopy w/ ureteroscopy w/ lithotripsy (Left, 05/04/2005); Colonoscopy (02/17/2005); Nephrolithotomy (Left, 02/01/2014); Parathyroidectomy (Left, 07/10/2014); CYSTO/  LEFT RETROGRADE PYELOGRAM/ STENT PLACEMENT  (01/26/2005); Esophagogastroduodenoscopy (egd) with esophageal dilation (x4  last one 07-21-2010); Inguinal hernia repair (Left, yrs ago); Cystoscopy/retrograde/ureteroscopy/stone extraction with basket (Left, 12/30/2015); Cystoscopy with stent placement (Left, 12/30/2015); Holmium laser application (Left, 192/01/9416; Cystoscopy/ureteroscopy/holmium laser/stent placement (Left, 03/09/2016); Total hip arthroplasty (Left, 06/24/2016); Total hip revision (Left, 11/18/2016); ORIF hip fracture (Left, 04/16/2017); Lumbar laminectomy/decompression microdiscectomy (Left, 11/09/2017); Back surgery (04/2018); Posterior lumbar fusion (09/07/2019); Cataract extraction (Bilateral, 2022); and Esophageal manometry (N/A, 04/09/2021). FAMILY HISTORY His family history includes Alzheimer's disease in his mother; Bladder Cancer in his sister; Diabetes in his sister; Heart disease in his father and mother. SOCIAL HISTORY He  reports that he quit smoking about 47 years ago. His smoking use included cigarettes. He has never used smokeless tobacco. He reports current alcohol use of about 1.0 - 2.0 standard drink of alcohol per week. He reports that he does not use drugs.  MEDICARE WELLNESS OBJECTIVES: Physical activity: Current Exercise Habits: Home exercise routine, Type of exercise: walking;strength training/weights, Time (Minutes): 40, Frequency (Times/Week): 3, Weekly Exercise (Minutes/Week): 120, Intensity: Mild, Exercise limited by: orthopedic condition(s) Cardiac risk factors: Cardiac Risk Factors include: advanced age (>557m, >6>64omen);hypertension;dyslipidemia;smoking/ tobacco exposure;sedentary lifestyle;obesity (BMI >30kg/m2);male gender;diabetes mellitus Depression/mood screen:      01/19/2022   11:53 AM  Depression screen PHQ 2/9  Decreased Interest 0  Down, Depressed, Hopeless 0  PHQ - 2 Score 0    ADLs:     01/19/2022   11:53 AM 10/01/2021   10:52 PM  In your present state of health, do you have any  difficulty performing the following activities:  Hearing? 0 0  Vision? 0 0  Difficulty concentrating or making decisions? 0 0  Walking or climbing stairs? 1 0  Dressing or bathing? 0 0  Doing errands, shopping? 0 0     Cognitive Testing  Alert? Yes  Normal Appearance?Yes  Oriented to person? Yes  Place? Yes   Time? Yes  Recall of three objects?  Yes  Can perform simple  calculations? Yes  Displays appropriate judgment?Yes  Can read the correct time from a watch face?Yes  EOL planning: Does Patient Have a Medical Advance Directive?: Yes Type of Advance Directive: Healthcare Power of Attorney, Living will Does patient want to make changes to medical advance directive?: No - Patient declined Copy of [place] in Chart?: No - copy requested   Objective:   Today's Vitals   01/19/22 1128  BP: 118/72  Pulse: 62  Temp: 97.9 F (36.6 C)  SpO2: 97%  Weight: 252 lb 3.2 oz (114.4 kg)  Height: 5' 10" (1.778 m)     Body mass index is 36.19 kg/m.  General appearance: alert, no distress, WD/WN, male HEENT: normocephalic, sclerae anicteric, TMs pearly, nares patent, no discharge or erythema, pharynx normal Oral cavity: MMM, no lesions, crowded mouth Neck: supple, large neck circumference, no lymphadenopathy, no thyromegaly, no masses Heart: RRR, normal S1, S2, no murmurs Lungs: CTA bilaterally, no wheezes, rhonchi, or rales Abdomen: +bs, soft, obese, non tender, non distended, no masses, no hepatomegaly, no splenomegaly Musculoskeletal: nontender, no swelling, no obvious deformity, antalgic gait with cane Extremities: no edema, no cyanosis, no clubbing Pulses: 2+ symmetric, upper and lower extremities, normal cap refill Neurological: alert, oriented x 3, CN2-12 intact, strength normal upper extremities, weakness in bilateral lower extremities, sensation diminished in L thigh, no cerebellar signs, gait antalgic with cane Psychiatric: normal affect, behavior normal,  pleasant   Medicare Attestation I have personally reviewed: The patient's medical and social history Their use of alcohol, tobacco or illicit drugs Their current medications and supplements The patient's functional ability including ADLs,fall risks, home safety risks, cognitive, and hearing and visual impairment Diet and physical activities Evidence for depression or mood disorders  The patient's weight, height, BMI, and visual acuity have been recorded in the chart.  I have made referrals, counseling, and provided education to the patient based on review of the above and I have provided the patient with a written personalized care plan for preventive services.     Alycia Rossetti, NP   01/19/2022

## 2022-01-19 ENCOUNTER — Encounter: Payer: Self-pay | Admitting: Nurse Practitioner

## 2022-01-19 ENCOUNTER — Ambulatory Visit (INDEPENDENT_AMBULATORY_CARE_PROVIDER_SITE_OTHER): Payer: Medicare Other | Admitting: Nurse Practitioner

## 2022-01-19 VITALS — BP 118/72 | HR 62 | Temp 97.9°F | Ht 70.0 in | Wt 252.2 lb

## 2022-01-19 DIAGNOSIS — E559 Vitamin D deficiency, unspecified: Secondary | ICD-10-CM | POA: Diagnosis not present

## 2022-01-19 DIAGNOSIS — N1832 Chronic kidney disease, stage 3b: Secondary | ICD-10-CM | POA: Diagnosis not present

## 2022-01-19 DIAGNOSIS — N200 Calculus of kidney: Secondary | ICD-10-CM | POA: Diagnosis not present

## 2022-01-19 DIAGNOSIS — Z0001 Encounter for general adult medical examination with abnormal findings: Secondary | ICD-10-CM

## 2022-01-19 DIAGNOSIS — N1831 Chronic kidney disease, stage 3a: Secondary | ICD-10-CM

## 2022-01-19 DIAGNOSIS — E1169 Type 2 diabetes mellitus with other specified complication: Secondary | ICD-10-CM

## 2022-01-19 DIAGNOSIS — M1612 Unilateral primary osteoarthritis, left hip: Secondary | ICD-10-CM | POA: Diagnosis not present

## 2022-01-19 DIAGNOSIS — E210 Primary hyperparathyroidism: Secondary | ICD-10-CM

## 2022-01-19 DIAGNOSIS — N521 Erectile dysfunction due to diseases classified elsewhere: Secondary | ICD-10-CM

## 2022-01-19 DIAGNOSIS — E538 Deficiency of other specified B group vitamins: Secondary | ICD-10-CM | POA: Diagnosis not present

## 2022-01-19 DIAGNOSIS — M48062 Spinal stenosis, lumbar region with neurogenic claudication: Secondary | ICD-10-CM

## 2022-01-19 DIAGNOSIS — I10 Essential (primary) hypertension: Secondary | ICD-10-CM

## 2022-01-19 DIAGNOSIS — Z79899 Other long term (current) drug therapy: Secondary | ICD-10-CM

## 2022-01-19 DIAGNOSIS — N183 Chronic kidney disease, stage 3 unspecified: Secondary | ICD-10-CM

## 2022-01-19 DIAGNOSIS — I700 Atherosclerosis of aorta: Secondary | ICD-10-CM

## 2022-01-19 DIAGNOSIS — N401 Enlarged prostate with lower urinary tract symptoms: Secondary | ICD-10-CM

## 2022-01-19 DIAGNOSIS — Z86010 Personal history of colonic polyps: Secondary | ICD-10-CM

## 2022-01-19 DIAGNOSIS — Z0000 Encounter for general adult medical examination without abnormal findings: Secondary | ICD-10-CM

## 2022-01-19 DIAGNOSIS — N138 Other obstructive and reflux uropathy: Secondary | ICD-10-CM

## 2022-01-19 DIAGNOSIS — E1122 Type 2 diabetes mellitus with diabetic chronic kidney disease: Secondary | ICD-10-CM | POA: Diagnosis not present

## 2022-01-19 DIAGNOSIS — E785 Hyperlipidemia, unspecified: Secondary | ICD-10-CM

## 2022-01-19 DIAGNOSIS — R6889 Other general symptoms and signs: Secondary | ICD-10-CM | POA: Diagnosis not present

## 2022-01-19 NOTE — Patient Instructions (Signed)

## 2022-01-20 LAB — CBC WITH DIFFERENTIAL/PLATELET
Absolute Monocytes: 777 cells/uL (ref 200–950)
Basophils Absolute: 67 cells/uL (ref 0–200)
Basophils Relative: 0.9 %
Eosinophils Absolute: 259 cells/uL (ref 15–500)
Eosinophils Relative: 3.5 %
HCT: 42.3 % (ref 38.5–50.0)
Hemoglobin: 14.3 g/dL (ref 13.2–17.1)
Lymphs Abs: 1554 cells/uL (ref 850–3900)
MCH: 29.9 pg (ref 27.0–33.0)
MCHC: 33.8 g/dL (ref 32.0–36.0)
MCV: 88.3 fL (ref 80.0–100.0)
MPV: 10.7 fL (ref 7.5–12.5)
Monocytes Relative: 10.5 %
Neutro Abs: 4743 cells/uL (ref 1500–7800)
Neutrophils Relative %: 64.1 %
Platelets: 284 10*3/uL (ref 140–400)
RBC: 4.79 10*6/uL (ref 4.20–5.80)
RDW: 12.7 % (ref 11.0–15.0)
Total Lymphocyte: 21 %
WBC: 7.4 10*3/uL (ref 3.8–10.8)

## 2022-01-20 LAB — COMPLETE METABOLIC PANEL WITH GFR
AG Ratio: 1.4 (calc) (ref 1.0–2.5)
ALT: 20 U/L (ref 9–46)
AST: 25 U/L (ref 10–35)
Albumin: 4.4 g/dL (ref 3.6–5.1)
Alkaline phosphatase (APISO): 35 U/L (ref 35–144)
BUN/Creatinine Ratio: 14 (calc) (ref 6–22)
BUN: 26 mg/dL — ABNORMAL HIGH (ref 7–25)
CO2: 26 mmol/L (ref 20–32)
Calcium: 10 mg/dL (ref 8.6–10.3)
Chloride: 101 mmol/L (ref 98–110)
Creat: 1.84 mg/dL — ABNORMAL HIGH (ref 0.70–1.28)
Globulin: 3.2 g/dL (calc) (ref 1.9–3.7)
Glucose, Bld: 90 mg/dL (ref 65–99)
Potassium: 4.9 mmol/L (ref 3.5–5.3)
Sodium: 137 mmol/L (ref 135–146)
Total Bilirubin: 0.7 mg/dL (ref 0.2–1.2)
Total Protein: 7.6 g/dL (ref 6.1–8.1)
eGFR: 37 mL/min/{1.73_m2} — ABNORMAL LOW (ref >=60)

## 2022-01-20 LAB — LIPID PANEL
Cholesterol: 188 mg/dL (ref <200)
HDL: 55 mg/dL (ref >=40)
LDL Cholesterol (Calc): 107 mg/dL (calc) — ABNORMAL HIGH
Non-HDL Cholesterol (Calc): 133 mg/dL (calc) — ABNORMAL HIGH (ref <130)
Total CHOL/HDL Ratio: 3.4 (calc) (ref <5.0)
Triglycerides: 138 mg/dL (ref <150)

## 2022-01-20 LAB — HEMOGLOBIN A1C
Hgb A1c MFr Bld: 6.3 % of total Hgb — ABNORMAL HIGH (ref <5.7)
Mean Plasma Glucose: 134 mg/dL
eAG (mmol/L): 7.4 mmol/L

## 2022-01-20 LAB — TSH: TSH: 3.19 mIU/L (ref 0.40–4.50)

## 2022-01-30 DIAGNOSIS — M48062 Spinal stenosis, lumbar region with neurogenic claudication: Secondary | ICD-10-CM | POA: Diagnosis not present

## 2022-02-10 ENCOUNTER — Encounter: Payer: Self-pay | Admitting: Internal Medicine

## 2022-02-19 DIAGNOSIS — M5126 Other intervertebral disc displacement, lumbar region: Secondary | ICD-10-CM | POA: Diagnosis not present

## 2022-02-19 DIAGNOSIS — R531 Weakness: Secondary | ICD-10-CM | POA: Diagnosis not present

## 2022-02-19 DIAGNOSIS — Z96642 Presence of left artificial hip joint: Secondary | ICD-10-CM | POA: Diagnosis not present

## 2022-02-19 DIAGNOSIS — Z4789 Encounter for other orthopedic aftercare: Secondary | ICD-10-CM | POA: Diagnosis not present

## 2022-03-03 DIAGNOSIS — M5126 Other intervertebral disc displacement, lumbar region: Secondary | ICD-10-CM | POA: Diagnosis not present

## 2022-03-03 DIAGNOSIS — R531 Weakness: Secondary | ICD-10-CM | POA: Diagnosis not present

## 2022-03-03 DIAGNOSIS — Z96642 Presence of left artificial hip joint: Secondary | ICD-10-CM | POA: Diagnosis not present

## 2022-03-03 DIAGNOSIS — Z4789 Encounter for other orthopedic aftercare: Secondary | ICD-10-CM | POA: Diagnosis not present

## 2022-03-06 DIAGNOSIS — Z96642 Presence of left artificial hip joint: Secondary | ICD-10-CM | POA: Diagnosis not present

## 2022-03-06 DIAGNOSIS — M5126 Other intervertebral disc displacement, lumbar region: Secondary | ICD-10-CM | POA: Diagnosis not present

## 2022-03-06 DIAGNOSIS — R531 Weakness: Secondary | ICD-10-CM | POA: Diagnosis not present

## 2022-03-06 DIAGNOSIS — Z4789 Encounter for other orthopedic aftercare: Secondary | ICD-10-CM | POA: Diagnosis not present

## 2022-03-09 ENCOUNTER — Other Ambulatory Visit: Payer: Self-pay | Admitting: Internal Medicine

## 2022-03-10 ENCOUNTER — Telehealth: Payer: Self-pay

## 2022-03-10 NOTE — Telephone Encounter (Signed)
LM-03/10/22-Called pt. And scheduled CP initial visit for 1/23 at 9:00AM for a phone visit. (6 min.)

## 2022-03-13 DIAGNOSIS — Z4789 Encounter for other orthopedic aftercare: Secondary | ICD-10-CM | POA: Diagnosis not present

## 2022-03-13 DIAGNOSIS — R531 Weakness: Secondary | ICD-10-CM | POA: Diagnosis not present

## 2022-03-13 DIAGNOSIS — M5126 Other intervertebral disc displacement, lumbar region: Secondary | ICD-10-CM | POA: Diagnosis not present

## 2022-03-13 DIAGNOSIS — Z96642 Presence of left artificial hip joint: Secondary | ICD-10-CM | POA: Diagnosis not present

## 2022-03-24 ENCOUNTER — Ambulatory Visit: Payer: Medicare Other | Admitting: Pharmacy Technician

## 2022-03-24 DIAGNOSIS — G720 Drug-induced myopathy: Secondary | ICD-10-CM

## 2022-03-24 DIAGNOSIS — M160 Bilateral primary osteoarthritis of hip: Secondary | ICD-10-CM

## 2022-03-24 DIAGNOSIS — Z79899 Other long term (current) drug therapy: Secondary | ICD-10-CM

## 2022-03-24 DIAGNOSIS — E1169 Type 2 diabetes mellitus with other specified complication: Secondary | ICD-10-CM

## 2022-03-24 DIAGNOSIS — E669 Obesity, unspecified: Secondary | ICD-10-CM

## 2022-03-24 DIAGNOSIS — N1831 Chronic kidney disease, stage 3a: Secondary | ICD-10-CM

## 2022-03-26 NOTE — Progress Notes (Signed)
Initial Pharmacist Visit (CCM)  [name] [name]  6 years, Male  DOB: 16-Feb-1941  M:  . Chronic Conditions Patient's Chronic Conditions: Hypertension (HTN), Gastroesophageal Reflux Disease (GERD), Chronic Kidney Disease (CKD), Hyperlipidemia/Dyslipidemia (HLD), Diabetes (DM), Osteoarthritis, Vitamins/Supplements . Summary for PCP:  1. Limit Celebrex use due to kidney function. 2. Try Tylenol every 6 hours as needed for pain. Only taking 1 Percocet daily. 3. Increase water intake. 4. Work on weight loss and exercise. . Doctor and Hospital Visits Were there PCP Visits in last 6 months?: Yes PCP Visits details: 01/19/22-Wilkinson, Raeford Razor, NP-Patient presented for [facility] Annual Wellness Visit. No medication changes noted.  08/03/23Unk Pinto, MD-Patient presented for Annual Exam. Labs ordered. No medication changes.  . Were there Specialist Visits in last 6 months?: Yes Specialist Visits details: 01/30/22-[name])- Patient presented for office visit due to back pain. No known medication changes.  11/10/21-[name] [name](Dermatology)-Patient presented for Inflamed Seborrheic Keratosis. No Known medication changes noted per EPIC(External Claim)  10/08/21-[name] [name]-Patient presented for GA 3 month follow up due to right ankle swelling. No medication changes noted.   09/29/21-[name] [name](Urology)-Patient presented for Feeling of incomplete bladder emptying. No Known medication changes noted per EPIC(External Claim) . Was there a Hospital Visit in last 30 days?: No Were there other Hospital Visits in last 6 months?: No . Visit Completed on: 03/24/2022 Subjective Information Did patient bring medications to appointment?: No What source (s) was used to reconcile medications this visit?: EMR list, Verbal recall from patient/caregiver Subjective: Very nice gentleman presenting via phone for CCM initial visit. He lives at home with  his wife. They also have a beach house. He is still working some. Limited by pain most days. Walks with a cane for stability. No falls recently. Modified call today due to patient having another appt. Lifestyle habits: Diet: No formal diet, little appetite, eats out dinner Exercise: [employer] stationary bike 2 days a week Tobacco: None Alcohol: None Caffeine: Coffee Recreational drugs: None . SDOH questions were documented and reviewed within the past 12 months or since hospitalization?: No What is your living situation today? I have a steady place to live Think about the place you live. Do you have problems with any of the following? None of the above Within the past 12 months, you worried that your food would run out before you got money to buy more? Never true Within the past 12 months, the food you bought just didn't last and you didn't have money to get more? Never true In the past 12 months, has lack of reliable transportation kept you from medical appointments, meetings, work or from getting things needed for daily living? No In the past 12 months, has the electric, gas, oil, or water company threatened to shut off services in your home? No How often does anyone, including family and friends, physically hurt you?  Never  How often does anyone, including family and friends, insult or talk down to you?  Never  How often does anyone, including friends and family, threaten you with harm?  Never  How often does anyone, including family and friends, scream or curse at you? Never . Medication Adherence Does the HC have access to medication refill history?: Yes Medication adherence rates for STAR metric medications: Losartan '100mg'$ -LF:01/19/22-90DS, bisoprolol-hydrochlorothiazide (ZIAC) 5-6.25 MG tablet-LF:01/19/22-90DS Medication adherence rates for non-STAR metric medications: Unknown 5 day fill Gap. Factors that may affect medication adherence?: Pill burden Name and location of Current pharmacy:  [facility] -  [address], [address], [place] [address] Phone: [phone]  Fax: [phone]  Current Rx insurance plan: [employer] Are meds synced by current pharmacy?: No Are meds delivered by current pharmacy?: No - delivery not available Would patient benefit from direct intervention of clinical lead in dispensing process to optimize clinical outcomes?: No Are [facility] pharmacy services available where patient lives?: Yes Is patient disadvantaged to use [facility]?: Yes Does patient experience delays in picking up medications due to transportation concerns (getting to pharmacy)?: No Medication organization: Pill Box Assessment:: Adherent . Hypertension (HTN) Most Recent BP: 122/74 ([place]) Most Recent HR: 62 taken on: 01/30/2022 Care Gap: Need BP documented or last BP 140/90 or higher: Addressed Assessed today?: No Drug: losartan (COZAAR) 100 MG tablet Pharmacist Assessment: Appropriate, Effective, Safe, Accessible Drug: bisoprolol-hydrochlorothiazide (ZIAC) 5-6.25 MG tablet Pharmacist Assessment: Appropriate, Effective, Safe, Accessible . Hyperlipidemia/Dyslipidemia (HLD) Last Lipid panel on: 01/19/2022 TC (Goal<200): 188 LDL: 107 HDL (Goal>40): 55 TG (Goal<150): 138 ASCVD 10-year risk?is:: N/A due to Age > 57 Assessed today?: Yes Additional Risk Factors: Obesity LDL Goal: <70 Does patient have any of the following diagnosis codes that will exclude them from statin therapy?: Myopathy, unspecified - G72.9 Verified ICD-10 code G72.9 is submitted to EMR: Yes We discussed: Complications of hyperlipidemia and prevention methods with patient for 5-10 minutes, Increasing exercise (walking, biking, swimming) to a goal of 30 minutes per day, as able based on current activity level and health or as directed by your healthcare provider, How a diet high in fruits/vegetables/nuts/whole grains/beans may help  to reduce your cholesterol. Increasing soluble fiber intake.  Avoiding sugary foods and trans fat, limiting carbohydrates, and reducing portion sizes. Recommended increasing intake of healthy fats into their diet, Weight reduction- We discussed losing 5-10% of body weight Assessment:: Uncontrolled Drug: fenofibrate (TRICOR) 145 MG tablet Pharmacist Assessment: Query Appropriateness Drug: Omega 3 fatty acids Pharmacist Assessment: Query Appropriateness Plan/Follow up: Patient prefers to keep working on diet and lifestyle modifications. . Diabetes (DM) Most recent A1C: 6.3 taken on: 01/19/2022 Previous A1C: 5.9 taken on: 10/02/2021 Most Recent GFR: 37 taken on: 01/19/2022 Type: 2 Most recent microalbumin ratio: 4 tested on: 10/02/2021 Care Gap: Statin therapy needed: Patient excluded from population (Age > 16, exclusion code present) Care Gap: Need A1c documented or last A1c > 9 %: Addressed Care Gap: Need eye exam documented in EMR or by claim: Patient excluded from population (Age > 2) Care Gap: Need eGFR and uACR for kidney health evaluation: Addressed Assessed today?: Yes Goal A1C: < 6.5 % Type: 2 Is Patient taking statin medication?: No 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 Does patient have any of the following diagnosis codes that will exclude them from statin therapy?: Myopathy, unspecified - G72.9 Verified ICD-10 code G72.9 is submitted to EMR: Yes Is patient taking ACEi / ARB?: Yes Blood glucose monitoring: Not monitoring Has patient experienced any hypoglycemic episodes?: No We discussed: Low carbohydrate eating plan with an emphasis on whole grains, legumes, nuts, fruits, and vegetables and minimal refined and processed foods., Proper diabetic foot care including daily foot exams and wearing closed toe shoes., Healthy eating habits for DM with patient for 15-20 minutes Assessment:: Controlled . GERD Assessed today?: No Drug: pantoprazole (PROTONIX) 40  MG tablet twice daily Pharmacist Assessment: Appropriate, Effective, Safe, Accessible . 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 Osteoarthritis Assessed today?: No Drug: Celebrex '200mg'$  daily Pharmacist Assessment: Query Appropriateness Drug: Gabapentin '300mg'$  twice daily Pharmacist Assessment: Query Appropriateness Drug: Percocet 5/'325mg'$  every 6 hours as needed Pharmacist Assessment: Appropriate, Effective, Query Safety Plan/Follow up: Limit Celebrex use due to kidney function. Try supplementing Tylenol every 6 hours as needed since only taking 1 Percocet daily. Work on weight loss and strengthening exercises. . Vitamins / Supplements We discussed: Confirming with pharmacist the safety of new vitamins / supplements with current medications before starting / adding to medication regimen Drug: b complex vitamins tablet Pharmacist Assessment: Query Appropriateness Drug: cholecalciferol (VITAMIN D3) 25 MCG (1000 UNIT) Pharmacist Assessment: Appropriate, Effective, Safe,  Accessible Drug: Flaxseed, Linseed, (FLAXSEED OIL) 1200 MG CAPS Pharmacist Assessment: Query Appropriateness . Preventative Health Care Gap: Colorectal cancer screening: Addressed Care Gap: Annual Wellness Visit (AWV): Addressed Additional exercise counseling points. We discussed: aiming to lose 5 to 10% of body weight through lifestyle modifications, incorporating flexibility, balance, and strength training exercises Additional diet counseling points. We discussed: key components of the DASH diet, key components of a low-carb eating plan, aiming to consume at least 8 cups of water day . Clinical Summary Next Pharmacist Follow Up: 6 months Next AWV: 01/20/2023 Next PCP Visit: 04/21/2022 . Recommended CRN Follow-Up in: Not indicated at this time Physician Referral Statement:: I reaffirm my previous referral of patient for CCM services Attestation Statement:: CCM Services:  This encounter meets complex CCM services and moderate to high medical decision making.  Prior to outreach and patient consent for Chronic Care Management, I referred this patient for services after reviewing the nominated patient list or from a personal encounter with the patient.  I have personally reviewed this encounter including the documentation in this note and have collaborated with the care management provider regarding care management and care coordination activities to include development and update of the comprehensive care plan I am certifying that I agree with the content of this note and encounter as supervising physician. Marland Kitchen Pharmacist Interventions Pharmacist Interventions discussed: Yes Modified Therapy: Ineffective dose Monitoring: Routine monitoring Education: Lifestyle modifications . CPP Prep: 54mn CPP Televisit: 259m CPP Doc: [id number] Care Plan: 5mi25m AverMarda StalkerarmD Clinical Pharmacist AverNaida Sleightton'@[email or web address]'$ .care [phone] . CP Care Plan  [name] [name]  81 y58rs, Male  DOB:  [birth year]   __________________________________________________ General Information Details Chronic Conditions: Hypertension (HTN), Hyperlipidemia/Dyslipidemia (HLD), Gastroesophageal Reflux Disease (GERD), Chronic Kidney Disease (CKD), Osteoarthritis, Pre-Diabetes Review and act on the below actions as discussed with your clinical pharmacist.: 1. Increase water intake to 64oz as tolerated. 2. Continue weight loss to goal 230 pounds. Next Follow Up with the Pharmacist:: 6 months- phone Contact your clinical pharmacist and care team with any questions or concerns. Team Phone #:: [phone] Pharmacist:: AverManatiistered  Nurse: [place]:: [place] Plan High Blood Pressure GOAL: Maintain Blood Pressure less than: 130/80 Your most recent BP is: 122/74 [facility]) taken on: 01/30/2022 Patient education:: Recommend following the DASH diet, which emphasizes fruits and vegetables and low-fat dairy products along with whole grains, fish, poultry, and nuts. Reduce red meats and sugars., Recommend limiting the daily amount of salt intake to less than '1500mg'$ /per day or 2/3 teaspoonful a day, Educated patient to call PCP office if you develop episodes of low blood pressure, dizziness, falls or increased headaches, and/or swelling of legs and feet. Discussed checking and recording home blood pressure readings: 3 times per week A weight loss of 10% can reduce blood pressure readings by 20/10 mmHg. Discussed setting a weight loss goal of: 10% Your current blood pressure medications are:: losartan (COZAAR) 100 MG tablet bisoprolol-hydrochlorothiazide (ZIAC) 5-6.25 MG tablet High Cholesterol GOAL:: Maintain triglycerides less than 150, Maintain LDL (bad) cholesterol less than 70 Your most recent LDL level is: 107 Your most recent triglycerides level is: 138 taken on: 01/19/2022 Patient education:: Discussed  increasing exercise (walking, biking, swimming) to a goal of 30 minutes per day, as able based on current activity level and health and/or as directed by your healthcare provider., Discussed how a diet high in fruits/vegetables/nuts/whole grains/beans may help to reduce your cholesterol. Increasing soluble fiber intake (whole grains, fruits, vegetables, beans, nuts and seeds). Recommended avoiding sugary foods and trans fat, limiting carbohydrates, and reducing portion sizes. Recommended Patient to incorporate more healthy fats (salmon, cold-water fish, almonds, walnuts) into their diet. Your current cholesterol medications are:: fenofibrate (TRICOR) 145 MG tablet Omega 3 Fatty Acid '1200mg'$  twice daily Additional Information:: Allergic to Statins Gastroesophageal Reflux Disease (GERD) GOAL:: Reduce and relieve severity and frequency of Gastroesophageal Reflux Disease (GERD) symptoms and prevent long-term complications Patient education:: Educated patient to not abruptly stop medication due to the risk of temporarily increased symptoms. Your current GERD medications are:: pantoprazole (PROTONIX) 40 MG tablet twice daily Chronic kidney disease (CKD) GOAL:: Reduce the burden of chronic kidney disease and related complications Patient education:: Educated Patient on importance of blood pressure and blood sugar control to help stop kidney disease from worsening., Recommend avoiding medications that can harm the kidneys, such as a group of medications called NSAIDs, which include ibuprofen (Advil/Motrin) and naproxen (Aleve)., Recommend limiting dietary sodium intake  to less than 1 teaspoonful or 2000 mg per day. Additional Information:: Water intake at least 64oz daily. Limit Celebrex use. Arthritis GOAL: Decrease pain level to (on a 0-10 scale): 4 Patient education:: Discussed with patient the benefit of weight loss with helping improve pain by reducing the amount of pressure on the joints., Discussed with  patient the benefit of strengthening exercises to help improve pain control. Your current arthritis medications are:: Celebrex '200mg'$  daily Gabapentin '300mg'$  twice daily Percocet 5/'325mg'$  daily Additional Information:: Can try additional Tylenol for pain control. Pre-Diabetes GOAL:: Prevent progression to diabetes - maintain A1c under 6.5 % Your current A1C is: 6.3% taken on: 01/19/2022 Patient education:: Recommend avoiding both table sugar (ex: regular sodas, juice and sweet tea) and artificially (aspartame, saccharin, sucralose, etc.) sweetened beverages (ex: diet sodas) and increasing water intake., Discussed increasing exercise (walking, biking, swimming) to a goal of 30 minutes per day, as able based on current activity level and health and/or as directed by your healthcare provider., Education provided on importance of regular mealtimes, carbohydrates goals for each meal and snacks, food label reading, portion sizes,  and increasing intake of dietary fiber (whole grains, fruits, vegetables, beans, nuts and seeds). Other Needs No Task Details

## 2022-03-27 DIAGNOSIS — Z4789 Encounter for other orthopedic aftercare: Secondary | ICD-10-CM | POA: Diagnosis not present

## 2022-03-27 DIAGNOSIS — Z96642 Presence of left artificial hip joint: Secondary | ICD-10-CM | POA: Diagnosis not present

## 2022-03-27 DIAGNOSIS — R531 Weakness: Secondary | ICD-10-CM | POA: Diagnosis not present

## 2022-03-27 DIAGNOSIS — M5126 Other intervertebral disc displacement, lumbar region: Secondary | ICD-10-CM | POA: Diagnosis not present

## 2022-03-31 DIAGNOSIS — R531 Weakness: Secondary | ICD-10-CM | POA: Diagnosis not present

## 2022-03-31 DIAGNOSIS — M5126 Other intervertebral disc displacement, lumbar region: Secondary | ICD-10-CM | POA: Diagnosis not present

## 2022-03-31 DIAGNOSIS — Z96642 Presence of left artificial hip joint: Secondary | ICD-10-CM | POA: Diagnosis not present

## 2022-03-31 DIAGNOSIS — Z4789 Encounter for other orthopedic aftercare: Secondary | ICD-10-CM | POA: Diagnosis not present

## 2022-04-01 DIAGNOSIS — E1169 Type 2 diabetes mellitus with other specified complication: Secondary | ICD-10-CM

## 2022-04-01 DIAGNOSIS — E785 Hyperlipidemia, unspecified: Secondary | ICD-10-CM

## 2022-04-01 DIAGNOSIS — N1831 Chronic kidney disease, stage 3a: Secondary | ICD-10-CM

## 2022-04-01 DIAGNOSIS — E1122 Type 2 diabetes mellitus with diabetic chronic kidney disease: Secondary | ICD-10-CM

## 2022-04-03 ENCOUNTER — Other Ambulatory Visit: Payer: Self-pay | Admitting: Nurse Practitioner

## 2022-04-03 DIAGNOSIS — K219 Gastro-esophageal reflux disease without esophagitis: Secondary | ICD-10-CM

## 2022-04-07 DIAGNOSIS — R531 Weakness: Secondary | ICD-10-CM | POA: Diagnosis not present

## 2022-04-07 DIAGNOSIS — Z4789 Encounter for other orthopedic aftercare: Secondary | ICD-10-CM | POA: Diagnosis not present

## 2022-04-07 DIAGNOSIS — M5126 Other intervertebral disc displacement, lumbar region: Secondary | ICD-10-CM | POA: Diagnosis not present

## 2022-04-07 DIAGNOSIS — Z96642 Presence of left artificial hip joint: Secondary | ICD-10-CM | POA: Diagnosis not present

## 2022-04-13 DIAGNOSIS — Z961 Presence of intraocular lens: Secondary | ICD-10-CM | POA: Diagnosis not present

## 2022-04-14 ENCOUNTER — Other Ambulatory Visit: Payer: Self-pay | Admitting: Nurse Practitioner

## 2022-04-17 DIAGNOSIS — M5126 Other intervertebral disc displacement, lumbar region: Secondary | ICD-10-CM | POA: Diagnosis not present

## 2022-04-17 DIAGNOSIS — Z96642 Presence of left artificial hip joint: Secondary | ICD-10-CM | POA: Diagnosis not present

## 2022-04-17 DIAGNOSIS — Z4789 Encounter for other orthopedic aftercare: Secondary | ICD-10-CM | POA: Diagnosis not present

## 2022-04-17 DIAGNOSIS — R531 Weakness: Secondary | ICD-10-CM | POA: Diagnosis not present

## 2022-04-21 ENCOUNTER — Ambulatory Visit: Payer: Medicare Other | Admitting: Internal Medicine

## 2022-04-21 DIAGNOSIS — R531 Weakness: Secondary | ICD-10-CM | POA: Diagnosis not present

## 2022-04-21 DIAGNOSIS — Z96642 Presence of left artificial hip joint: Secondary | ICD-10-CM | POA: Diagnosis not present

## 2022-04-21 DIAGNOSIS — M5126 Other intervertebral disc displacement, lumbar region: Secondary | ICD-10-CM | POA: Diagnosis not present

## 2022-04-21 DIAGNOSIS — Z4789 Encounter for other orthopedic aftercare: Secondary | ICD-10-CM | POA: Diagnosis not present

## 2022-04-28 DIAGNOSIS — Z4789 Encounter for other orthopedic aftercare: Secondary | ICD-10-CM | POA: Diagnosis not present

## 2022-04-28 DIAGNOSIS — R531 Weakness: Secondary | ICD-10-CM | POA: Diagnosis not present

## 2022-04-28 DIAGNOSIS — Z96642 Presence of left artificial hip joint: Secondary | ICD-10-CM | POA: Diagnosis not present

## 2022-04-28 DIAGNOSIS — M5126 Other intervertebral disc displacement, lumbar region: Secondary | ICD-10-CM | POA: Diagnosis not present

## 2022-05-01 DIAGNOSIS — Z96642 Presence of left artificial hip joint: Secondary | ICD-10-CM | POA: Diagnosis not present

## 2022-05-01 DIAGNOSIS — Z4789 Encounter for other orthopedic aftercare: Secondary | ICD-10-CM | POA: Diagnosis not present

## 2022-05-01 DIAGNOSIS — M5126 Other intervertebral disc displacement, lumbar region: Secondary | ICD-10-CM | POA: Diagnosis not present

## 2022-05-01 DIAGNOSIS — R531 Weakness: Secondary | ICD-10-CM | POA: Diagnosis not present

## 2022-05-04 DIAGNOSIS — L280 Lichen simplex chronicus: Secondary | ICD-10-CM | POA: Diagnosis not present

## 2022-05-04 DIAGNOSIS — L738 Other specified follicular disorders: Secondary | ICD-10-CM | POA: Diagnosis not present

## 2022-05-04 DIAGNOSIS — Z85828 Personal history of other malignant neoplasm of skin: Secondary | ICD-10-CM | POA: Diagnosis not present

## 2022-05-05 DIAGNOSIS — Z96642 Presence of left artificial hip joint: Secondary | ICD-10-CM | POA: Diagnosis not present

## 2022-05-05 DIAGNOSIS — Z4789 Encounter for other orthopedic aftercare: Secondary | ICD-10-CM | POA: Diagnosis not present

## 2022-05-05 DIAGNOSIS — R531 Weakness: Secondary | ICD-10-CM | POA: Diagnosis not present

## 2022-05-05 DIAGNOSIS — M5126 Other intervertebral disc displacement, lumbar region: Secondary | ICD-10-CM | POA: Diagnosis not present

## 2022-05-07 ENCOUNTER — Ambulatory Visit: Payer: Medicare Other | Admitting: Internal Medicine

## 2022-05-07 ENCOUNTER — Ambulatory Visit: Payer: Medicare Other | Admitting: Nurse Practitioner

## 2022-05-07 NOTE — Progress Notes (Deleted)
MEDICARE ANNUAL WELLNESS VISIT AND FOLLOW UP  Assessment:    Atherosclerosis of aorta ([place]) - Per CT 2021 Control blood pressure, cholesterol, glucose, increase exercise.    Essential hypertension Continue current medications Monitor blood pressure at home; call if consistently over 130/80 Continue DASH diet.   Reminder to go to the ER if any CP, SOB, nausea, dizziness, severe HA, changes vision/speech, left arm numbness and tingling and jaw pain. - CBC with Differential/Platelet - CMP/GFR - TSH  Hyperlipidemia associated with type 2 diabetes mellitus (HCC) Statin myopathy Continue fenofibrate 145 mg daily, if elevated consider Zetia Discussed dietary and exercise modifications Low fat diet - lipid panel    Type 2 diabetes mellitus with stage 3b chronic kidney disease, without long-term current use of insulin (HCC) Controlled by diet & exercise Continue to monitor - A1C  Stage 3b chronic kidney disease associated with T2DM (HCC) Increase fluids  Avoid NSAIDS - reviewed in detail today, stop meloxicam, aleve - tylenol is ok Blood pressure control Monitor sugars  Will continue to monitor, refer nephrology if trending down further   Obesity - BMI 34 Long discussion about weight loss, diet, and exercise Discussed final goal weight  Patient didn't tolerate topamax, has also tried phentermine;  Currently making good progress with lifestyle continue close follow up.   Gastroesophageal reflux disease, unspecified whether esophagitis present Hx of stricture s/p dilation, no recurrenct on last EGD Well managed on current medications, Protonix 40 mg BID Discussed diet, avoiding triggers and other lifestyle changes  Medication management - Magnesium  Vitamin D deficiency - VITAMIN D 25 Hydroxy (Vit-D Deficiency, Fractures)   Over 30 minutes of face to face interview, exam, counseling, chart review, and critical decision making was performed  Future Appointments  Date  Time Provider [place]  05/07/2022  2:00 PM Alycia Rossetti, NP [facility] None  10/07/2022 11:00 AM Unk Pinto, MD [facility] None  01/20/2023 11:00 AM Alycia Rossetti, NP [facility] None       Subjective:  [name] [name] is a 82 y.o. male who presents for Medicare Annual Wellness Visit. He has Hypertension; GERD (gastroesophageal reflux disease); Kidney stones; Obesity (BMI 30.0-34.9); Vitamin D deficiency; Medication management; Hyperparathyroidism, primary ([place]); CKD stage 3 due to type 2 diabetes mellitus ([place]); OA (osteoarthritis) of hip; Normocytic anemia; Vitamin B12 deficiency; Herniated nucleus pulposus, L2-3 left; Spinal stenosis; Hyperlipidemia associated with type 2 diabetes mellitus ([place]); Fixation hardware in spine; Aortic atherosclerosis (HCC) by Lumbar CT scan on 11/22/2019; Type 2 diabetes mellitus with stage 3a chronic kidney disease, without long-term current use of insulin ([place]); History of adenomatous polyp of colon; Erectile dysfunction associated with type 2 diabetes mellitus ([place]); Statin myopathy; and Dysphagia on their problem list.   He has had multiple surgeries on L hip for failed arthroplasty, Ortho unfortunately suspects this will be permanent. Continues with Dr. Ellene Route for back pain. Pain medication  He has noticed swelling of right lower leg which has been present x 1 month and appears to be increasing in size. Does have some paresthesias in that leg. He does not wear compression socks. Does not get enough fluid intake during the day.  Does not add salt to food but otherwise is not limiting salt intake.  Denies chest pain, shortness of breath, dizziness.  Continues to have issues with GERD, currently taking Protonix BID.  BMI is There is no height or weight on file to calculate BMI., he is working on diet and exercise. Working on diet, PT twice  a week. Didn't tolerate phentermine/topamax (sedation, likely due to topamax).  Colonoscopy weight was  down to 238 has gained 21 pounds, was 250 09/2020.   Wt Readings from Last 3 Encounters:  01/19/22 252 lb 3.2 oz (114.4 kg)  10/02/21 249 lb 12.8 oz (113.3 kg)  03/20/21 259 lb 3.2 oz (117.6 kg)   His blood pressure has been controlled at home, today their BP is   BP Readings from Last 3 Encounters:  01/19/22 118/72  10/02/21 137/85  03/20/21 140/80    He does workout, PT 1 hour twice. He denies chest pain, shortness of breath, dizziness.   He has aortic atherosclerosis per CT 11/2019.   He is on cholesterol medication (fenofibrate, fish oil), hx of statin myalgias reported (remote, can't recall agents, numerous per patient). His cholesterol is not at goal. The cholesterol last visit was:   Lab Results  Component Value Date   CHOL 188 01/19/2022   HDL 55 01/19/2022   LDLCALC 107 (H) 01/19/2022   TRIG 138 01/19/2022   CHOLHDL 3.4 01/19/2022   He has been working on diet and exercise for prediabetes/T2DM with CKD 3, and denies paresthesia of the feet, polydipsia, polyuria and visual disturbances. Last A1C in the office was:  Lab Results  Component Value Date   HGBA1C 6.3 (H) 01/19/2022   He has had CKD III for several years, monitoring. Does take NSAIDS, receptive to stopping. Also hx of recurrent renal stones, follows [facility]. Last GFR  Lab Results  Component Value Date   GFRNONAA 38 (L) 04/24/2020   GFRNONAA 48 (L) 12/07/2019   GFRNONAA 45 (L) 12/06/2019   Patient is on Vitamin D supplement.   Lab Results  Component Value Date   VD25OH 70 10/02/2021     He has hx of primary hyperparathyroid, s/p parathyroidectomy by Dr. Harlow Asa with recent normal calcium/PTH:   Lab Results  Component Value Date   PTH 20 10/02/2021   CALCIUM 10.0 01/19/2022   CAION 1.15 12/05/2019   PHOS 3.8 10/03/2020     Medication Review: Current Outpatient Medications on File Prior to Visit  Medication Sig Dispense Refill   aspirin EC 81 MG tablet Restart on 12/11/2019 1 tablet 0   b  complex vitamins tablet Take 1 tablet by mouth daily.     bisoprolol-hydrochlorothiazide (ZIAC) 5-6.25 MG tablet TAKE 1 TABLET BY MOUTH ONCE DAILY IN THE MORNING FOR BLOOD PRESSURE 90 tablet 1   CELEBREX 200 MG capsule TAKE 1 CAPSULE BY MOUTH EVERY DAY AS NEEDED     cholecalciferol (VITAMIN D3) 25 MCG (1000 UNIT) tablet Take 1,000 Units by mouth daily.     fenofibrate (TRICOR) 145 MG tablet TAKE 1 TABLET BY MOUTH EVERY DAY FOR TRIGLYCERIDES 90 tablet 0   finasteride (PROSCAR) 5 MG tablet Take 5 mg by mouth daily.     Flaxseed, Linseed, (FLAXSEED OIL) 1200 MG CAPS Take 1 capsule by mouth daily. (Patient not taking: Reported on 01/19/2022)     gabapentin (NEURONTIN) 300 MG capsule Take 300 mg by mouth 2 (two) times daily.     hydrocortisone 2.5 % cream Apply topically.     losartan (COZAAR) 100 MG tablet Take 1 tablet  Daily for BP                                             /  TAKE                                  BY                                 MOUTH 90 tablet 3   magnesium oxide (MAG-OX) 400 MG tablet Take 400 mg by mouth daily.     Omega-3 Fatty Acids (FISH OIL) 1200 MG CAPS Restart on 10.11 1 capsule 0   oxyCODONE-acetaminophen (PERCOCET/ROXICET) 5-325 MG tablet Take 1 tablet by mouth every 6 (six) hours as needed.     pantoprazole (PROTONIX) 40 MG tablet TAKE 1 TABLET BY MOUTH TWICE A DAY FOR INDIGESTION AND ACID REFLUX 180 tablet 1   tamsulosin (FLOMAX) 0.4 MG CAPS capsule Take 0.4 mg by mouth daily after breakfast.   3   No current facility-administered medications on file prior to visit.    Current Problems (verified) Patient Active Problem List   Diagnosis Date Noted   Dysphagia 01/08/2021   Type 2 diabetes mellitus with stage 3a chronic kidney disease, without long-term current use of insulin ([place]) 10/03/2020   History of adenomatous polyp of colon 10/03/2020   Erectile dysfunction associated with type 2 diabetes mellitus ([place]) 10/03/2020   Statin  myopathy 10/03/2020   Aortic atherosclerosis ([place]) by Lumbar CT scan on 11/22/2019 04/23/2020   Fixation hardware in spine 12/05/2019   Hyperlipidemia associated with type 2 diabetes mellitus ([place]) 09/11/2019   Spinal stenosis 09/05/2019   Herniated nucleus pulposus, L2-3 left 11/09/2017   Normocytic anemia 05/02/2017   Vitamin B12 deficiency 05/02/2017   OA (osteoarthritis) of hip 06/24/2016   CKD stage 3 due to type 2 diabetes mellitus ([place]) 03/25/2015   Hyperparathyroidism, primary ([place]) 07/09/2014   Vitamin D deficiency 06/13/2013   Medication management 06/13/2013   Hypertension    GERD (gastroesophageal reflux disease)    Kidney stones    Obesity (BMI 30.0-34.9)     Screening Tests Immunization History  Administered Date(s) Administered   Influenza Split 12/27/2012   Influenza, High Dose Seasonal PF 01/31/2014, 11/27/2014, 01/12/2017, 01/13/2018, 12/02/2021   Influenza-Unspecified 12/16/2016, 01/12/2017   PFIZER(Purple Top)SARS-COV-2 Vaccination 04/19/2019, 05/10/2019, 01/31/2020, 09/16/2020   Pneumococcal Conjugate-13 03/25/2015   Pneumococcal Polysaccharide-23 03/02/2008   Td 03/02/2005, 09/24/2015   Zoster, Live 03/02/2006    Preventative care: Last colonoscopy: 01/2017, adenomatous polyps, 3 year recall, Dr. Silverio Decamp EGD 11/2018  Prior vaccinations: TD or Tdap: 2017 Influenza: 2019  Pneumococcal: 2010 Prevnar13: 2017 Shingles/Zostavax: 2008 - ask about shingrix  Covid 19: 2/2 + booster x 2   Names of Other Physician/Practitioners you currently use: 1. [facility] here for primary care 2. Dr. Katy Fitch, eye doctor, last visit 2022 3. Dr. Altamese Ivor, dentist, last visit 2022, q72m Patient Care Team: MUnk Pinto MD as PCP - General (Internal Medicine) DNewt Minion MD as Consulting Physician (Orthopedic Surgery) KInda Castle MD (Inactive) as Consulting Physician (Gastroenterology) DFranchot Gallo MD as Consulting  Physician (Urology) [facility]leene Mains [facility] (Pharmacist)  Allergies Allergies  Allergen Reactions   Iodinated Contrast Media Shortness Of Breath   Iohexol Shortness Of Breath   Statins     Myalgia, with several per patient    SURGICAL HISTORY He  has a past surgical history that includes tumor ear (Left, age 82; Cystoscopy w/ ureteroscopy w/ lithotripsy (  Left, 05/04/2005); Colonoscopy (02/17/2005); Nephrolithotomy (Left, 02/01/2014); Parathyroidectomy (Left, 07/10/2014); CYSTO/  LEFT RETROGRADE PYELOGRAM/ STENT PLACEMENT (01/26/2005); Esophagogastroduodenoscopy (egd) with esophageal dilation (x4  last one 07-21-2010); Inguinal hernia repair (Left, yrs ago); Cystoscopy/retrograde/ureteroscopy/stone extraction with basket (Left, 12/30/2015); Cystoscopy with stent placement (Left, 12/30/2015); Holmium laser application (Left, [id number]); Cystoscopy/ureteroscopy/holmium laser/stent placement (Left, 03/09/2016); Total hip arthroplasty (Left, 06/24/2016); Total hip revision (Left, 11/18/2016); ORIF hip fracture (Left, 04/16/2017); Lumbar laminectomy/decompression microdiscectomy (Left, 11/09/2017); Back surgery (04/2018); Posterior lumbar fusion (09/07/2019); Cataract extraction (Bilateral, 2022); and Esophageal manometry (N/A, 04/09/2021). FAMILY HISTORY His family history includes Alzheimer's disease in his mother; Bladder Cancer in his sister; Diabetes in his sister; Heart disease in his father and mother. SOCIAL HISTORY He  reports that he quit smoking about 48 years ago. His smoking use included cigarettes. He has never used smokeless tobacco. He reports current alcohol use of about 1.0 - 2.0 standard drink of alcohol per week. He reports that he does not use drugs.    Objective:   There were no vitals filed for this visit.    There is no height or weight on file to calculate BMI.  General appearance: alert, no distress, WD/WN, male HEENT: normocephalic, sclerae anicteric, TMs  pearly, nares patent, no discharge or erythema, pharynx normal Oral cavity: MMM, no lesions, crowded mouth Neck: supple, large neck circumference, no lymphadenopathy, no thyromegaly, no masses Heart: RRR, normal S1, S2, no murmurs Lungs: CTA bilaterally, no wheezes, rhonchi, or rales Abdomen: +bs, soft, obese, non tender, non distended, no masses, no hepatomegaly, no splenomegaly Musculoskeletal: nontender, no swelling, no obvious deformity, antalgic gait with cane Extremities: 2+ pitting edema of right lower leg. no cyanosis, no clubbing. Right calf 15 3/4 inches, left calf 15 Right ankle 10 inches, left ankle 9.5 inches Right lower leg slightly erythematous, not warm to the touch. Pulses: 2+ symmetric, upper and lower extremities, normal cap refill Neurological: alert, oriented x 3, CN2-12 intact, strength normal upper extremities, weakness in bilateral lower extremities, sensation diminished in right foot, no cerebellar signs, gait antalgic with cane Psychiatric: normal affect, behavior normal, pleasant     Alycia Rossetti, NP   05/07/2022

## 2022-05-08 DIAGNOSIS — Z4789 Encounter for other orthopedic aftercare: Secondary | ICD-10-CM | POA: Diagnosis not present

## 2022-05-08 DIAGNOSIS — R531 Weakness: Secondary | ICD-10-CM | POA: Diagnosis not present

## 2022-05-08 DIAGNOSIS — M5126 Other intervertebral disc displacement, lumbar region: Secondary | ICD-10-CM | POA: Diagnosis not present

## 2022-05-08 DIAGNOSIS — Z96642 Presence of left artificial hip joint: Secondary | ICD-10-CM | POA: Diagnosis not present

## 2022-05-12 DIAGNOSIS — R531 Weakness: Secondary | ICD-10-CM | POA: Diagnosis not present

## 2022-05-12 DIAGNOSIS — Z4789 Encounter for other orthopedic aftercare: Secondary | ICD-10-CM | POA: Diagnosis not present

## 2022-05-12 DIAGNOSIS — M5126 Other intervertebral disc displacement, lumbar region: Secondary | ICD-10-CM | POA: Diagnosis not present

## 2022-05-12 DIAGNOSIS — Z96642 Presence of left artificial hip joint: Secondary | ICD-10-CM | POA: Diagnosis not present

## 2022-05-19 DIAGNOSIS — Z4789 Encounter for other orthopedic aftercare: Secondary | ICD-10-CM | POA: Diagnosis not present

## 2022-05-19 DIAGNOSIS — M5126 Other intervertebral disc displacement, lumbar region: Secondary | ICD-10-CM | POA: Diagnosis not present

## 2022-05-19 DIAGNOSIS — R531 Weakness: Secondary | ICD-10-CM | POA: Diagnosis not present

## 2022-05-19 DIAGNOSIS — Z96642 Presence of left artificial hip joint: Secondary | ICD-10-CM | POA: Diagnosis not present

## 2022-05-26 DIAGNOSIS — Z4789 Encounter for other orthopedic aftercare: Secondary | ICD-10-CM | POA: Diagnosis not present

## 2022-05-26 DIAGNOSIS — R531 Weakness: Secondary | ICD-10-CM | POA: Diagnosis not present

## 2022-05-26 DIAGNOSIS — M5126 Other intervertebral disc displacement, lumbar region: Secondary | ICD-10-CM | POA: Diagnosis not present

## 2022-05-26 DIAGNOSIS — Z96642 Presence of left artificial hip joint: Secondary | ICD-10-CM | POA: Diagnosis not present

## 2022-06-01 DIAGNOSIS — Z96642 Presence of left artificial hip joint: Secondary | ICD-10-CM | POA: Diagnosis not present

## 2022-06-01 DIAGNOSIS — R531 Weakness: Secondary | ICD-10-CM | POA: Diagnosis not present

## 2022-06-01 DIAGNOSIS — Z4789 Encounter for other orthopedic aftercare: Secondary | ICD-10-CM | POA: Diagnosis not present

## 2022-06-01 DIAGNOSIS — M5126 Other intervertebral disc displacement, lumbar region: Secondary | ICD-10-CM | POA: Diagnosis not present

## 2022-06-16 ENCOUNTER — Other Ambulatory Visit: Payer: Self-pay | Admitting: Nurse Practitioner

## 2022-06-30 DIAGNOSIS — R531 Weakness: Secondary | ICD-10-CM | POA: Diagnosis not present

## 2022-06-30 DIAGNOSIS — Z4789 Encounter for other orthopedic aftercare: Secondary | ICD-10-CM | POA: Diagnosis not present

## 2022-06-30 DIAGNOSIS — M5126 Other intervertebral disc displacement, lumbar region: Secondary | ICD-10-CM | POA: Diagnosis not present

## 2022-06-30 DIAGNOSIS — Z96642 Presence of left artificial hip joint: Secondary | ICD-10-CM | POA: Diagnosis not present

## 2022-07-03 DIAGNOSIS — Z4789 Encounter for other orthopedic aftercare: Secondary | ICD-10-CM | POA: Diagnosis not present

## 2022-07-03 DIAGNOSIS — R531 Weakness: Secondary | ICD-10-CM | POA: Diagnosis not present

## 2022-07-03 DIAGNOSIS — M5126 Other intervertebral disc displacement, lumbar region: Secondary | ICD-10-CM | POA: Diagnosis not present

## 2022-07-03 DIAGNOSIS — Z96642 Presence of left artificial hip joint: Secondary | ICD-10-CM | POA: Diagnosis not present

## 2022-07-08 DIAGNOSIS — R531 Weakness: Secondary | ICD-10-CM | POA: Diagnosis not present

## 2022-07-08 DIAGNOSIS — M5126 Other intervertebral disc displacement, lumbar region: Secondary | ICD-10-CM | POA: Diagnosis not present

## 2022-07-08 DIAGNOSIS — Z4789 Encounter for other orthopedic aftercare: Secondary | ICD-10-CM | POA: Diagnosis not present

## 2022-07-08 DIAGNOSIS — Z96642 Presence of left artificial hip joint: Secondary | ICD-10-CM | POA: Diagnosis not present

## 2022-09-21 ENCOUNTER — Other Ambulatory Visit: Payer: Self-pay | Admitting: Nurse Practitioner

## 2022-09-29 ENCOUNTER — Other Ambulatory Visit: Payer: Self-pay | Admitting: Nurse Practitioner

## 2022-09-29 DIAGNOSIS — K219 Gastro-esophageal reflux disease without esophagitis: Secondary | ICD-10-CM

## 2022-10-07 ENCOUNTER — Ambulatory Visit (INDEPENDENT_AMBULATORY_CARE_PROVIDER_SITE_OTHER): Payer: Medicare Other | Admitting: Internal Medicine

## 2022-10-07 ENCOUNTER — Encounter: Payer: Self-pay | Admitting: Internal Medicine

## 2022-10-07 VITALS — BP 134/82 | HR 59 | Temp 97.6°F | Resp 16 | Ht 70.0 in | Wt 249.4 lb

## 2022-10-07 DIAGNOSIS — Z136 Encounter for screening for cardiovascular disorders: Secondary | ICD-10-CM

## 2022-10-07 DIAGNOSIS — N401 Enlarged prostate with lower urinary tract symptoms: Secondary | ICD-10-CM

## 2022-10-07 DIAGNOSIS — Z1211 Encounter for screening for malignant neoplasm of colon: Secondary | ICD-10-CM

## 2022-10-07 DIAGNOSIS — Z79899 Other long term (current) drug therapy: Secondary | ICD-10-CM

## 2022-10-07 DIAGNOSIS — Z0000 Encounter for general adult medical examination without abnormal findings: Secondary | ICD-10-CM | POA: Diagnosis not present

## 2022-10-07 DIAGNOSIS — E210 Primary hyperparathyroidism: Secondary | ICD-10-CM | POA: Diagnosis not present

## 2022-10-07 DIAGNOSIS — N1831 Chronic kidney disease, stage 3a: Secondary | ICD-10-CM

## 2022-10-07 DIAGNOSIS — I700 Atherosclerosis of aorta: Secondary | ICD-10-CM

## 2022-10-07 DIAGNOSIS — Z125 Encounter for screening for malignant neoplasm of prostate: Secondary | ICD-10-CM

## 2022-10-07 DIAGNOSIS — Z87891 Personal history of nicotine dependence: Secondary | ICD-10-CM

## 2022-10-07 DIAGNOSIS — I10 Essential (primary) hypertension: Secondary | ICD-10-CM | POA: Diagnosis not present

## 2022-10-07 DIAGNOSIS — Z8249 Family history of ischemic heart disease and other diseases of the circulatory system: Secondary | ICD-10-CM

## 2022-10-07 DIAGNOSIS — E1169 Type 2 diabetes mellitus with other specified complication: Secondary | ICD-10-CM

## 2022-10-07 DIAGNOSIS — Z0001 Encounter for general adult medical examination with abnormal findings: Secondary | ICD-10-CM

## 2022-10-07 DIAGNOSIS — E559 Vitamin D deficiency, unspecified: Secondary | ICD-10-CM

## 2022-10-07 LAB — CBC WITH DIFFERENTIAL/PLATELET
Absolute Monocytes: 509 cells/uL (ref 200–950)
Basophils Absolute: 27 cells/uL (ref 0–200)
Basophils Relative: 0.4 %
Eosinophils Absolute: 221 cells/uL (ref 15–500)
Eosinophils Relative: 3.3 %
HCT: 43.8 % (ref 38.5–50.0)
Hemoglobin: 14.8 g/dL (ref 13.2–17.1)
Lymphs Abs: 1514 cells/uL (ref 850–3900)
MCH: 30.1 pg (ref 27.0–33.0)
MCHC: 33.8 g/dL (ref 32.0–36.0)
MCV: 89 fL (ref 80.0–100.0)
MPV: 11 fL (ref 7.5–12.5)
Monocytes Relative: 7.6 %
Neutro Abs: 4429 cells/uL (ref 1500–7800)
Neutrophils Relative %: 66.1 %
Platelets: 282 10*3/uL (ref 140–400)
RBC: 4.92 10*6/uL (ref 4.20–5.80)
RDW: 13 % (ref 11.0–15.0)
Total Lymphocyte: 22.6 %
WBC: 6.7 10*3/uL (ref 3.8–10.8)

## 2022-10-07 MED ORDER — TIRZEPATIDE 2.5 MG/0.5ML ~~LOC~~ SOAJ: SUBCUTANEOUS | 0 refills | Status: DC

## 2022-10-07 NOTE — Addendum Note (Signed)
Addended by: Lucky Cowboy on: 10/07/2022 01:05 PM   Modules accepted: Orders

## 2022-10-07 NOTE — Patient Instructions (Signed)

## 2022-10-07 NOTE — Addendum Note (Signed)
Addended by: Lucky Cowboy on: 10/07/2022 04:21 PM   Modules accepted: Orders

## 2022-10-07 NOTE — Progress Notes (Signed)
Annual  Screening/Preventative Visit  & Comprehensive Evaluation & Examination   Future Appointments  Date Time Provider Department  10/07/2022                         cpe 11:00 AM Lucky Cowboy, MD [facility]  01/20/2023                      wellness 11:00 AM Raynelle Dick, NP [facility]  10/20/2023                        cpe 11:00 AM Lucky Cowboy, MD [facility]       This very nice 82 y.o.  MWM  presents for a Screening /Preventative Visit & comprehensive evaluation and management of multiple medical co-morbidities.  Patient has been followed for HTN, HLD, T2_NIDDM  and Vitamin D Deficiency. Patient has moderate Obesity  ( BMI 36.15 ).        HTN predates circa 2002. Patient's BP has been controlled at home.  Today's BP is at goal -  134/82 .  Lumbar CT  scan  in 2021   found Aortic Atherosclerosis.   Patient denies any cardiac symptoms as chest pain, palpitations, shortness of breath, dizziness or ankle swelling.       Patient's hyperlipidemia is controlled with diet and medications. Patient denies myalgias or other medication SE's. Last lipids were  not at goal:  Lab Results  Component Value Date   CHOL 188 01/19/2022   HDL 55 01/19/2022   LDLCALC 107 (H) 01/19/2022   TRIG 138 01/19/2022   CHOLHDL 3.4 01/19/2022        Patient has hx/o  Moderate Obesity with BMI 36.15  and PreDiabetes (2010) transitioning to T2_DM (A1c 6.8% /Feb 2019)  w/CKD3a (GFR 48) attempting diet control.   Patient denies reactive hypoglycemic symptoms, visual blurring, diabetic polys or paresthesias. Last A1c was not at goal:   Lab Results  Component Value Date   HGBA1C 6.3 (H) 01/19/2022        Finally, patient has history of Vitamin D Deficiency ("36" /2009) and last vitamin D was at goal:   Lab Results  Component Value Date   VD25OH 67 10/02/2021       Current Outpatient Medications  Medication Instructions   aspirin EC 81 MG tablet Daily   b complex vitamins tablet 1  tablet, Oral, Daily   bisoprolol-hydrochlorothiazide (ZIAC) 5-6.25 MG tablet TAKE 1 TABLET DAILY IN THE MORNING FOR BLOOD PRESSURE   CELEBREX 200 MG capsule TAKE 1 CAPSULE  EVERY DAY AS NEEDED   cholecalciferol (VITAMIN D3) 1,000 Units, Oral, Daily   fenofibrate (TRICOR) 145 MG tablet TAKE 1 TABLET BY MOUTH EVERY DAY FOR TRIGLYCERIDES   finasteride (PROSCAR) 5 mg, Oral, Daily   FLAXSEED OIL 1200 MG CAPS 1 capsule, Daily   Gabapentin    300 mg 2 times daily   hydrocortisone 2.5 % cream Topical   losartan (COZAAR) 100 MG tablet Take 1 tablet  Daily    Magnesium  400 mg Daily   Omega-3 Fatty Acids (FISH OIL) 1200 MG CAPS Restart on 10.11   oxyCODONE-acetaminophen (PERCOCET/ROXICET) 5-325 MG tablet 1 tablet, Oral, Every 6 hours PRN   pantoprazole (PROTONIX) 40 MG tablet TAKE 1 TABLET BY MOUTH TWICE A DAY FOR INDIGESTION AND ACID REFLUX   tamsulosin (FLOMAX) 0.4 mg, Oral, Daily after breakfast     Allergies  Allergen Reactions   Iodinated Diagnostic Agents Shortness Of Breath   Iohexol Shortness Of Breath     Past Medical History:  Diagnosis Date   Arthritis    in left hip   ED (erectile dysfunction)    Enlarged prostate with lower urinary tract symptoms (LUTS)    GERD (gastroesophageal reflux disease)    History of chronic gastritis    History of colon polyps    hyperplastic 2006   History of esophageal stricture    S/P  DILATATION 2009; 2010; 2010 2012   History of kidney stones    hx. multiple kidney stones   History of kidney stones    History of primary hyperparathyroidism    s/p  left superior parathyroidectomy 07-10-2014   Hyperlipidemia    Hypertension    Ileus (HCC) 05/02/2017   Left ureteral stone    Pre-diabetes    Sepsis secondary to UTI (HCC) 05/02/2017     Health Maintenance  Topic Date Due   Hepatitis C Screening  Never done   OPHTHALMOLOGY EXAM  02/11/2019   INFLUENZA VACCINE  10/01/2019   FOOT EXAM  02/05/2020   COLONOSCOPY (Pts 45-74yrs Insurance  coverage will need to be confirmed)  02/13/2020   COVID-19 Vaccine (2 - Pfizer 3-dose series) 02/21/2020   HEMOGLOBIN A1C  06/04/2020   TETANUS/TDAP  09/23/2025   PNA vac Low Risk Adult  Completed     Immunization History  Administered Date(s) Administered   Influenza Split 12/27/2012   Influenza, High Dose  01/12/2017, 01/13/2018   Influenza 12/16/2016, 01/12/2017   PFIZER-SARS-COV-2 Vacc 01/31/2020   Pneumococcal -13 03/25/2015   Pneumococcal -23 03/02/2008   Td 03/02/2005, 09/24/2015   Zoster 03/02/2006    02/12/2017 - Colon - Dr Lavon Paganini   03/06/2021  Last Colon - Dr Rhea Belton - No f/u due to age   Past Surgical History:  Procedure Laterality Date   BACK SURGERY  04/2018   fusion L2 and L3   COLONOSCOPY  02/17/2005   CYSTO/  LEFT RETROGRADE PYELOGRAM/ STENT PLACEMENT  01/26/2005   CYSTOSCOPY W/ URETEROSCOPY W/ LITHOTRIPSY Left 05/04/2005   CYSTOSCOPY WITH STENT PLACEMENT Left 12/30/2015   Procedure: CYSTOSCOPY WITH STENT PLACEMENT;  Surgeon: Marcine Matar, MD;  Location: [facility];  Service: Urology;  Laterality: Left;   CYSTOSCOPY/RETROGRADE/URETEROSCOPY/STONE EXTRACTION WITH BASKET Left 12/30/2015   Procedure: CYSTOSCOPY/RETROGRADE/URETEROSCOPY/STONE EXTRACTION WITH BASKET;  Surgeon: Marcine Matar, MD;  Location: [facility];  Service: Urology;  Laterality: Left;   CYSTOSCOPY/URETEROSCOPY/HOLMIUM LASER/STENT PLACEMENT Left 03/09/2016   Procedure: CYSTOSCOPY/RETROGRADE PYELOGRAM/URETEROSCOPY/BASKET STONE EXTRACTION/STENT PLACEMENT;  Surgeon: Marcine Matar, MD;  Location: [facility];  Service: Urology;  Laterality: Left;   ESOPHAGOGASTRODUODENOSCOPY (EGD) WITH ESOPHAGEAL DILATION  x4  last one 07-21-2010   HOLMIUM LASER APPLICATION Left 12/30/2015   Procedure: HOLMIUM LASER APPLICATION;  Surgeon: Marcine Matar, MD;  Location: [facility];  Service: Urology;  Laterality: Left;   INGUINAL HERNIA REPAIR Left yrs ago    LUMBAR LAMINECTOMY/DECOMPRESSION MICRODISCECTOMY Left 11/09/2017   Procedure: Left Lumbar Two-Three Microdiscectomy;  Surgeon: Barnett Abu, MD;  Location: [facility];  Service: Neurosurgery;  Laterality: Left;  Left Lumbar Two-Three Microdiscectomy   NEPHROLITHOTOMY Left 02/01/2014   Procedure: NEPHROLITHOTOMY PERCUTANEOUS;  Surgeon: Chelsea Aus, MD;  Location: [facility];  Service: Urology;  Laterality: Left;   ORIF HIP FRACTURE Left 04/16/2017   Procedure: OPEN REDUCTION INTERNAL FIXATION HIP GREATER TROCHANTER;  Surgeon: Gean Birchwood, MD;  Location: [facility];  Service: Orthopedics;  Laterality: Left;  PARATHYROIDECTOMY Left 07/10/2014   Procedure: LEFT SUPERIOR PARATHYROIDECTOMY;  Surgeon: Darnell Level, MD;  Location: [facility];  Service: General;  Laterality: Left;   POSTERIOR LUMBAR FUSION  09/07/2019   TOTAL HIP ARTHROPLASTY Left 06/24/2016   Procedure: LEFT TOTAL HIP ARTHROPLASTY ANTERIOR APPROACH;  Surgeon: Ollen Gross, MD;  Location: [facility];  Service: Orthopedics;  Laterality: Left;   TOTAL HIP REVISION Left 11/18/2016   Procedure: Left femoral revision - posterior approach;  Surgeon: Ollen Gross, MD;  Location: [facility];  Service: Orthopedics;  Laterality: Left;   tumor ear Left age 38    topical growth behind left ear.   Family History  Problem Relation Age of Onset   Heart disease Mother    Alzheimer's disease Mother    Heart disease Father    Bladder Cancer Sister    Colon cancer Neg Hx    Esophageal cancer Neg Hx    Stomach cancer Neg Hx    Rectal cancer Neg Hx    Social History   Socioeconomic History   Marital status: Married    Spouse name: Bonita Quin   Number of children: 2  Occupational History   Occupation: real estate business  Tobacco Use   Smoking status: Former Smoker    Years: 8.00    Quit date: 03/02/1974    Years since quitting: 46.1   Smokeless tobacco: Never Used  Vaping Use   Vaping Use: Never used  Substance and Sexual Activity   Alcohol use: Not Currently     Alcohol/week: 1.0 - 2.0 standard drink    Types: 1 - 2 Glasses of wine per week   Drug use: No   Sexual activity: Yes   ROS Constitutional: Denies fever, chills, weight loss/gain, headaches, insomnia,  night sweats or change in appetite. Does c/o fatigue. Eyes: Denies redness, blurred vision, diplopia, discharge, itchy or watery eyes.  ENT: Denies discharge, congestion, post nasal drip, epistaxis, sore throat, earache, hearing loss, dental pain, Tinnitus, Vertigo, Sinus pain or snoring.  Cardio: Denies chest pain, palpitations, irregular heartbeat, syncope, dyspnea, diaphoresis, orthopnea, PND, claudication or edema Respiratory: denies cough, dyspnea, DOE, pleurisy, hoarseness, laryngitis or wheezing.  Gastrointestinal: Denies dysphagia, heartburn, reflux, water brash, pain, cramps, nausea, vomiting, bloating, diarrhea, constipation, hematemesis, melena, hematochezia, jaundice or hemorrhoids Genitourinary: Denies dysuria, frequency, discharge, hematuria or flank pain. Has urgency, nocturia x 2-3 & occasional hesitancy. Musculoskeletal: Denies arthralgia, myalgia, stiffness, Jt. Swelling, pain, limp or strain/sprain. Denies Falls. Skin: Denies puritis, rash, hives, warts, acne, eczema or change in skin lesion Neuro: No weakness, tremor, incoordination, spasms, paresthesia or pain Psychiatric: Denies confusion, memory loss or sensory loss. Denies Depression. Endocrine: Denies change in weight, skin, hair change, nocturia, and paresthesia, diabetic polys, visual blurring or hyper / hypo glycemic episodes.  Heme/Lymph: No excessive bleeding, bruising or enlarged lymph nodes.  Physical Exam  BP 134/82   Pulse (!) 59   Temp 97.6 F (36.4 C)   Resp 16   Ht 5\' 10"  (1.778 m)   Wt 249 lb 6.4 oz (113.1 kg)   SpO2 97%   BMI 35.79 kg/m   General Appearance: Over  nourished and and in no apparent distress.  Eyes: PERRLA, EOMs, conjunctiva no swelling or erythema, normal fundi and  vessels. Sinuses: No frontal/maxillary tenderness ENT/Mouth: EACs patent / TMs  nl. Nares clear without erythema, swelling, mucoid exudates. Oral hygiene is good. No erythema, swelling, or exudate. Tongue normal, non-obstructing. Tonsils not swollen or erythematous. Hearing normal.  Neck: Supple, thyroid not palpable.  No bruits, nodes or JVD. Respiratory: Respiratory effort normal.  BS equal and clear bilateral without rales, rhonci, wheezing or stridor. Cardio: Heart sounds are normal with regular rate and rhythm and no murmurs, rubs or gallops. Peripheral pulses are normal and equal bilaterally without edema. No aortic or femoral bruits. Chest: symmetric with normal excursions and percussion.  Abdomen: Soft, obese with Nl bowel sounds. Nontender, no guarding, rebound, hernias, masses, or organomegaly.  Lymphatics: Non tender without lymphadenopathy.  Musculoskeletal: Full ROM all peripheral extremities, joint stability, 5/5 strength, and normal gait. Skin: Warm and dry without rashes, lesions, cyanosis, clubbing or  ecchymosis.  Neuro: Cranial nerves intact, reflexes equal bilaterally. Normal muscle tone, no cerebellar symptoms. Sensation intact to touch, vibratory and Monofilament to the toes bilaterally. Pysch: Alert and oriented X 3 with normal affect, insight and judgment appropriate.   Assessment and Plan  1. Annual Preventative/Screening Exam    2. Class 2 severe obesity due to excess calories with serious  comorbidity and body mass index (BMI) of 36.0 to 36.9 in adult [facility])  - EKG 12-Lead - [place], RETROPERITNL ABD,  LTD  3. Essential hypertension  - EKG 12-Lead - [place], RETROPERITNL ABD,  LTD - Urinalysis, Routine w reflex microscopic - Microalbumin / creatinine urine ratio  4. Hyperlipidemia associated with type 2 diabetes mellitus (HCC)  - EKG 12-Lead - [place], RETROPERITNL ABD,  LTD - Lipid panel  5. Type 2 diabetes mellitus with stage 3a chronic kidney  disease, without  long-term current use of insulin (HCC)  - EKG 12-Lead - [place], RETROPERITNL ABD,  LTD - Urinalysis, Routine w reflex microscopic - Microalbumin / creatinine urine ratio - HM DIABETES FOOT EXAM - PR LOW EXTEMITY NEUR EXAM DOCUM  6. Aortic atherosclerosis (HCC) by Lumbar CT scan on 11/22/2019  - EKG 12-Lead - Lipid panel  7. Vitamin D deficiency  - VITAMIN D 25 Hydroxy  8. Hyperparathyroidism, primary (HCC)  - PTH, intact and calcium - COMPLETE METABOLIC PANEL WITH GFR  9. BPH with obstruction/lower urinary tract symptoms  - PSA  10. Prostate cancer screening  - PSA  11. Screening for heart disease  - EKG 12-Lead  12. FHx: heart disease  - EKG 12-Lead - [place], RETROPERITNL ABD,  LTD  13. Former smoker  - EKG 12-Lead - [place], RETROPERITNL ABD,  LTD  14. Screening for AAA (aortic abdominal aneurysm)  - [place], RETROPERITNL ABD,  LTD  15. Medication management  - Urinalysis, Routine w reflex microscopic - Microalbumin / creatinine urine ratio - CBC with Differential/Platelet - COMPLETE METABOLIC PANEL WITH GFR - Magnesium - Lipid panel - TSH - Hemoglobin A1c - Insulin, random - VITAMIN D 25 Hydroxy   16. Screening for colorectal cancer  - POC Hemoccult Bld/Stl              Patient was counseled in prudent diet, weight control to achieve/maintain BMI less than 25, BP monitoring, regular exercise and medications as discussed.  Discussed med effects and SE's. Routine screening labs and tests as requested with regular follow-up as recommended. Over 40 minutes of exam, counseling, chart review and high complex critical decision making was performed   [name] Maw, MD.

## 2022-10-08 NOTE — Progress Notes (Signed)
^<^<^<^<^<^<^<^<^<^<^<^<^<^<^<^<^<^<^<^<^<^<^<^<^<^<^<^<^<^<^<^<^<^<^<^<^ ^>^>^>^>^>^>^>^>^>^>^>>^>^>^>^>^>^>^>^>^>^>^>^>^>^>^>^>^>^>^>^>^>^>^>^>^>  -  If stopped low dose Aspirin 81 mg  & still having Bruising   - Suggest take Vitamin C 250 mg  / day   ( Vitamin C Deficiency is called " Scurvy " which is associated with                               - poor wound healing                                - increased bruising, bleeding gums                                  & a variety of other symptoms.   - Celebrex that you are taking is also associated with Increased bruising !   ^<^<^<^<^<^<^<^<^<^<^<^<^<^<^<^<^<^<^<^<^<^<^<^<^<^<^<^<^<^<^<^<^<^<^<^<^ ^>^>^>^>^>^>^>^>^>^>^>^>^>^>^>^>^>^>^>^>^>^>^>^>^>^>^>^>^>^>^>^>^>^>^>^>^  -  Kidney Functions  ( GFR = 39  ) is still low in Stage 3b   - Kidney functions still look a little dehydrated    Very important to drink adequate amounts of fluids to prevent permanent damage    - Recommend drink at least 6 bottles (16 ounces) of fluids /water /day = 96 Oz ~100 oz  - 100 oz = 3,000 cc or 3 liters / day  - >> That's 1 &1/2 bottles of a 2 liter soda bottle /day !   - Also medicines like Celebrex are "Bad" for your Kidneys - So suggest that you stop it   ^<^<^<^<^<^<^<^<^<^<^<^<^<^<^<^<^<^<^<^<^<^<^<^<^<^<^<^<^<^<^<^<^<^<^<^<^ ^>^>^>^>^>^>^>^>^>^>^>^>^>^>^>^>^>^>^>^>^>^>^>^>^>^>^>^>^>^>^>^>^>^>^>^>^  -  ^<^<^<^<^<^<^<^<^<^<^<^<^<^<^<^<^<^<^<^<^<^<^<^<^<^<^<^<^<^<^<^<^<^<^<^<^ ^>^>^>^>^>^>^>^>^>^>^>^>^>^>^>^>^>^>^>^>^>^>^>^>^>^>^>^>^>^>^>^>^>^>^>^>^  - Random glucose = - elevated ( Normal is less than 100) &  - A1c = 6.4%  - So very important to work on a better diet  Also suggest taking Cinnamon which is known to  increase Insulin sensitivity &  therefore decrease Insulin resistance of Diabetes   - Recommend "naturebell" on [employer] Cinnamon                                                          2 capsules = 9,000 mg               240 capsules for $16.95                If take 1 capsule Daily, that's an 8 month supply  - Also recommend take Vinegar - 1 ounce in water or other drink 2 x /day                                  is also known to lower blood sugar & A1c !  ^<^<^<^<^<^<^<^<^<^<^<^<^<^<^<^<^<^<^<^<^<^<^<^<^<^<^<^<^<^<^<^<^<^<^<^<^ ^>^>^>^>^>^>^>^>^>^>^>^>^>^>^>^>^>^>^>^>^>^>^>^>^>^>^>^>^>^>^>^>^>^>^>^>^  -  Hopefully will lose weight on the [facility] & "cure" your Diabetes   ^<^<^<^<^<^<^<^<^<^<^<^<^<^<^<^<^<^<^<^<^<^<^<^<^<^<^<^<^<^<^<^<^<^<^<^<^ ^>^>^>^>^>^>^>^>^>^>^>^>^>^>^>^>^>^>^>^>^>^>^>^>^>^>^>^>^>^>^>^>^>^>^>^>^  -  PSA - Very low - No Prostate Cancer - Great !   ^<^<^<^<^<^<^<^<^<^<^<^<^<^<^<^<^<^<^<^<^<^<^<^<^<^<^<^<^<^<^<^<^<^<^<^<^ ^>^>^>^>^>^>^>^>^>^>^>^>^>^>^>^>^>^>^>^>^>^>^>^>^>^>^>^>^>^>^>^>^>^>^>^>^  -   Magnesium = 1.8  is Very Very  low - goal is betw 2.0 - 2.5,   - So..............Marland Kitchen  Recommend that you INCREASE your  Magnesium  to  500 mg tablet  2 to 3 x /day !   - also important to eat lots of  leafy green vegetables   - spinach - Kale - collards - greens - okra - asparagus - broccoli   - quinoa - squash - almonds - black, red, white beans  -  peas - green beans  ^<^<^<^<^<^<^<^<^<^<^<^<^<^<^<^<^<^<^<^<^<^<^<^<^<^<^<^<^<^<^<^<^<^<^<^<^ ^>^>^>^>^>^>^>^>^>^>^>^>^>^>^>^>^>^>^>^>^>^>^>^>^>^>^>^>^>^>^>^>^>^>^>^>^  -  Vitamin D = 52 - is "OK"  ^<^<^<^<^<^<^<^<^<^<^<^<^<^<^<^<^<^<^<^<^<^<^<^<^<^<^<^<^<^<^<^<^<^<^<^<^ ^>^>^>^>^>^>^>^>^>^>^>^>^>^>^>^>^>^>^>^>^>^>^>^>^>^>^>^>^>^>^>^>^>^>^>^>^  -  All Else - CBC -  Electrolytes - Liver - Magnesium & Thyroid    - all  Normal /  OK  ^<^<^<^<^<^<^<^<^<^<^<^<^<^<^<^<^<^<^<^<^<^<^<^<^<^<^<^<^<^<^<^<^<^<^<^<^ ^>^>^>^>^>^>^>^>^>^>^>^>^>^>^>^>^>^>^>^>^>^>^>^>^>^>^>^>^>^>^>^>^>^>^>^>^                    ^<^<^<^<^<^<^<^<^<^<^<^<^<^<^<^<^<^<^<^<^<^<^<^<^<^<^<^<^<^<^<^<^<^<^<^<^ ^>^>^>^>^>^>^>^>^>^>^>^>^>^>^>^>^>^>^>^>^>^>^>^>^>^>^>^>^>^>^>^>^>^>^>^>^  -

## 2022-10-19 DIAGNOSIS — R3914 Feeling of incomplete bladder emptying: Secondary | ICD-10-CM | POA: Diagnosis not present

## 2022-10-26 ENCOUNTER — Other Ambulatory Visit: Payer: Self-pay | Admitting: Nurse Practitioner

## 2022-11-03 ENCOUNTER — Other Ambulatory Visit: Payer: Self-pay

## 2022-11-03 DIAGNOSIS — N1832 Chronic kidney disease, stage 3b: Secondary | ICD-10-CM | POA: Diagnosis not present

## 2022-11-03 DIAGNOSIS — Z1211 Encounter for screening for malignant neoplasm of colon: Secondary | ICD-10-CM

## 2022-11-03 DIAGNOSIS — Z1212 Encounter for screening for malignant neoplasm of rectum: Secondary | ICD-10-CM | POA: Diagnosis not present

## 2022-11-03 DIAGNOSIS — I129 Hypertensive chronic kidney disease with stage 1 through stage 4 chronic kidney disease, or unspecified chronic kidney disease: Secondary | ICD-10-CM | POA: Diagnosis not present

## 2022-11-03 DIAGNOSIS — Z87442 Personal history of urinary calculi: Secondary | ICD-10-CM | POA: Diagnosis not present

## 2022-11-03 LAB — POC HEMOCCULT BLD/STL (HOME/3-CARD/SCREEN)
Card #2 Fecal Occult Blod, POC: NEGATIVE
Card #3 Fecal Occult Blood, POC: NEGATIVE
Fecal Occult Blood, POC: NEGATIVE

## 2022-11-05 ENCOUNTER — Other Ambulatory Visit: Payer: Self-pay | Admitting: Nurse Practitioner

## 2022-11-05 ENCOUNTER — Other Ambulatory Visit: Payer: Self-pay | Admitting: Internal Medicine

## 2022-11-05 MED ORDER — TIRZEPATIDE 5 MG/0.5ML ~~LOC~~ SOAJ: SUBCUTANEOUS | 0 refills | Status: DC

## 2022-12-04 DIAGNOSIS — M4316 Spondylolisthesis, lumbar region: Secondary | ICD-10-CM | POA: Diagnosis not present

## 2022-12-04 DIAGNOSIS — M48062 Spinal stenosis, lumbar region with neurogenic claudication: Secondary | ICD-10-CM | POA: Diagnosis not present

## 2022-12-15 ENCOUNTER — Other Ambulatory Visit: Payer: Self-pay | Admitting: Nurse Practitioner

## 2022-12-30 DIAGNOSIS — Z9889 Other specified postprocedural states: Secondary | ICD-10-CM | POA: Diagnosis not present

## 2022-12-30 DIAGNOSIS — M2170 Unequal limb length (acquired), unspecified site: Secondary | ICD-10-CM | POA: Diagnosis not present

## 2022-12-30 DIAGNOSIS — M5416 Radiculopathy, lumbar region: Secondary | ICD-10-CM | POA: Diagnosis not present

## 2022-12-30 DIAGNOSIS — R29898 Other symptoms and signs involving the musculoskeletal system: Secondary | ICD-10-CM | POA: Diagnosis not present

## 2023-01-01 ENCOUNTER — Other Ambulatory Visit: Payer: Self-pay | Admitting: Internal Medicine

## 2023-01-19 NOTE — Progress Notes (Unsigned)
MEDICARE ANNUAL WELLNESS VISIT AND FOLLOW UP  Assessment:    Annual Medicare Wellness Visit Due annually  Health maintenance reviewed  Atherosclerosis of aorta (HCC) - Per CT 2021 Control blood pressure, cholesterol, glucose, increase exercise.    Essential hypertension Continue current medications: Ziac 5/6.25 mg every day , losartan 100 mg every day  Monitor blood pressure at home; call if consistently over 130/80 Continue DASH diet.   Reminder to go to the ER if any CP, SOB, nausea, dizziness, severe HA, changes vision/speech, left arm numbness and tingling and jaw pain. - CBC with Differential/Platelet - CMP/GFR - TSH  Hyperlipidemia associated with type 2 diabetes mellitus (HCC) Statin myopathy Continue fenofibrate Discussed dietary and exercise modifications Low fat diet - lipid panel   Hyperparathyroidism, primary (HCC) S/p partial resection Monitor, recent normal calcium/ PTH on 10/07/22  Type 2 diabetes mellitus with stage 3b chronic kidney disease, without long-term current use of insulin (HCC) Controlled by diet & exercise Continue to monitor Check skin and feet regularly - A1C  Stage 3b chronic kidney disease associated with T2DM (HCC) Increase fluids  Avoid NSAIDS - reviewed in detail today, stop meloxicam, aleve - tylenol is ok Blood pressure control Monitor sugars  Will continue to monitor, refer nephrology if trending down further -CMP, CBC  ED associated with T2DM (HCC) Uro follows; sildenafil PRN  Other secondary osteoarthritis of left hip S/p failed arthroplasty Follows with Dr Turner Daniels Discussed stop meloxicam due to renal, try tylenol, gabapentin  Type 2 Diabetes Mellitus with Obesity - BMI 34(HCC) Long discussion about weight loss, diet, and exercise Discussed final goal weight  Patient didn't tolerate topamax, has also tried phentermine;  Currently making good progress with lifestyle continue close follow up.   Kidney stones Continue to  monitor, push fluids Has seen [facility]  Gastroesophageal reflux disease, unspecified whether esophagitis present Hx of stricture s/p dilation, no recurrenct on last EGD Well managed on current medications: Protonix 40 mg QD Discussed diet, avoiding triggers and other lifestyle changes  Vitamin D deficiency Continue Vit D supplementation to maintain value in therapeutic level of 60-100  Spinal Stenosis lumbar Continue to follow with Dr Danielle Dess  Vitamin B12 deficiency  Continue supplementation  History of adenomatous polyp of colon - Colonoscopy UTD done 03/06/21- no further required   BPH with obstruction/lower urinary tract symptoms Continue finasteride and Tamsulosin  Medication management -     CBC with Differential/Platelet -     COMPLETE METABOLIC PANEL WITH GFR -     Lipid panel -     Hemoglobin A1C w/out eAG     Over 30 minutes of face to face interview, exam, counseling, chart review, and critical decision making was performed  Future Appointments  Date Time Provider Department Center  04/26/2023 10:30 AM Lucky Cowboy, MD [facility] None  10/20/2023 11:00 AM Lucky Cowboy, MD [facility] None  01/21/2024 10:30 AM Raynelle Dick, NP [facility] None     Plan:   During the course of the visit the patient was educated and counseled about appropriate screening and preventive services including:   Pneumococcal vaccine  Influenza vaccine Prevnar 13 Td vaccine Screening electrocardiogram Colorectal cancer screening Diabetes screening Glaucoma screening Nutrition counseling    Subjective:  NOE GLOD is a 82 y.o. male who presents for Medicare Annual Wellness Visit. He has Hypertension; GERD (gastroesophageal reflux disease); Kidney stones; Obesity (BMI 30.0-34.9); Vitamin D deficiency; Medication management; Hyperparathyroidism, primary (HCC); CKD stage 3 due to type 2 diabetes mellitus (HCC);  OA (osteoarthritis) of hip; Normocytic anemia;  Vitamin B12 deficiency; Herniated nucleus pulposus, L2-3 left; Spinal stenosis; Hyperlipidemia associated with type 2 diabetes mellitus (HCC); Fixation hardware in spine; Aortic atherosclerosis (HCC) by Lumbar CT scan on 11/22/2019; Type 2 diabetes mellitus with stage 3a chronic kidney disease, without long-term current use of insulin (HCC); History of adenomatous polyp of colon; Erectile dysfunction associated with type 2 diabetes mellitus (HCC); Statin myopathy; and Dysphagia on their problem list.   He has had multiple surgeries on L hip for failed arthroplasty, Ortho unfortunately suspects this will be permanent. Follow with Dr. Turner Daniels.He will do some walking, nothing regular.  Had both cataracts done 2 years ago, still some blurred vision.   He is followed by Dr. Danielle Dess for Spinal stenosis of lumbar region and Dr Deanne Coffer for multiple joint pain.He is on Gabapentin 300 mg BID and Percocet 5/325 mg 1 q 6 hours as needed for pain.  BMI is Body mass index is 34.24 kg/m., he is not working on diet and exercise.  Didn't tolerate phentermine/topamax (sedation, likely due to topamax). Currently on Mounjaro 5 mg QW , stopped for 3 weeks and then just restarted.  Wt Readings from Last 3 Encounters:  01/20/23 238 lb 9.6 oz (108.2 kg)  10/07/22 249 lb 6.4 oz (113.1 kg)  01/19/22 252 lb 3.2 oz (114.4 kg)   His blood pressure has been controlled at home with Losartan 100 mg QD and Ziac 5/6.25mg  QD, today their BP is BP: 120/64  BP Readings from Last 3 Encounters:  01/20/23 120/64  10/07/22 134/82  01/19/22 118/72  He does workout. He denies chest pain, shortness of breath, dizziness.   He has aortic atherosclerosis per CT 11/2019.   He is on cholesterol medication (fenofibrate, fish oil), hx of statin myalgias reported (remote, can't recall agents, numerous per patient). His cholesterol is not at goal. The cholesterol last visit was:   Lab Results  Component Value Date   CHOL 185 10/07/2022   HDL 57  10/07/2022   LDLCALC 103 (H) 10/07/2022   TRIG 157 (H) 10/07/2022   CHOLHDL 3.2 10/07/2022   He has been working on diet and exercise for T2DM with CKD 3, and denies paresthesia of the feet, polydipsia, polyuria and visual disturbances.Has not been checking his blood sugars. Currently on Mounjaro 5 mg SQ QW  Last A1C in the office was:  Lab Results  Component Value Date   HGBA1C 6.4 (H) 10/07/2022   He has had CKD III for several years, monitoring. Does take NSAIDS, was unable to stop Celebrex for pain.  He is drinking 64 ounces of water. Also hx of recurrent renal stones, follows [facility]. Last GFR  Lab Results  Component Value Date   EGFR 39 (L) 10/07/2022    Patient is on Vitamin D supplement.   Lab Results  Component Value Date   VD25OH 62 10/07/2022     He has hx of primary hyperparathyroid, s/p parathyroidectomy by Dr. Gerrit Friends with recent normal calcium/PTH:   Lab Results  Component Value Date   PTH 24 10/07/2022   CALCIUM 9.7 10/07/2022   CAION 1.15 12/05/2019   PHOS 3.8 10/03/2020   Continues on finasteride and tamsulosin for BPH symptoms. Still gets up 2-4 times a night. Has occasionally used straight cath to make sure emptying bladder completely.   Medication Review: Current Outpatient Medications on File Prior to Visit  Medication Sig Dispense Refill   aspirin 81 MG chewable tablet Every other day  bisoprolol-hydrochlorothiazide (ZIAC) 5-6.25 MG tablet Take  1 tablet  every Morning for BP                                                    /                                                                   TAKE                                         BY                                                 MOUTH                         ONCE ? ? ? DAILY 90 tablet 3   cholecalciferol (VITAMIN D3) 25 MCG (1000 UNIT) tablet Take 1,000 Units by mouth daily.     fenofibrate (TRICOR) 145 MG tablet TAKE 1 TABLET BY MOUTH EVERY DAY FOR TRIGLYCERIDES 90 tablet 0    finasteride (PROSCAR) 5 MG tablet Take 5 mg by mouth daily.     gabapentin (NEURONTIN) 300 MG capsule Take 300 mg by mouth 2 (two) times daily.     hydrocortisone 2.5 % cream Apply topically.     losartan (COZAAR) 100 MG tablet TAKE 1 TABLET BY MOUTH DAILY FOR BLOOD PRESSURE 90 tablet 3   magnesium oxide (MAG-OX) 400 MG tablet Take 400 mg by mouth daily.     MOUNJARO 5 MG/0.5ML Pen INJECT 1 PEN (5 MG) INTO SKIN EVERY 7 DAYS FOR DIABETES 6 mL 0   Omega-3 Fatty Acids (FISH OIL) 1200 MG CAPS Restart on 10.11 1 capsule 0   oxyCODONE-acetaminophen (PERCOCET/ROXICET) 5-325 MG tablet Take 1 tablet by mouth every 6 (six) hours as needed.     pantoprazole (PROTONIX) 40 MG tablet TAKE 1 TABLET BY MOUTH TWICE A DAY FOR INDIGESTION AND ACID REFLUX 180 tablet 1   tamsulosin (FLOMAX) 0.4 MG CAPS capsule Take 0.4 mg by mouth daily after breakfast.   3   b complex vitamins tablet Take 1 tablet by mouth daily. (Patient not taking: Reported on 01/20/2023)     No current facility-administered medications on file prior to visit.    Current Problems (verified) Patient Active Problem List   Diagnosis Date Noted   Dysphagia 01/08/2021   Type 2 diabetes mellitus with stage 3a chronic kidney disease, without long-term current use of insulin (HCC) 10/03/2020   History of adenomatous polyp of colon 10/03/2020   Erectile dysfunction associated with type 2 diabetes mellitus (HCC) 10/03/2020   Statin myopathy 10/03/2020   Aortic atherosclerosis (HCC) by Lumbar CT scan on 11/22/2019 04/23/2020   Fixation hardware in spine 12/05/2019   Hyperlipidemia associated with type 2 diabetes mellitus (HCC) 09/11/2019   Spinal stenosis  09/05/2019   Herniated nucleus pulposus, L2-3 left 11/09/2017   Normocytic anemia 05/02/2017   Vitamin B12 deficiency 05/02/2017   OA (osteoarthritis) of hip 06/24/2016   CKD stage 3 due to type 2 diabetes mellitus (HCC) 03/25/2015   Hyperparathyroidism, primary (HCC) 07/09/2014   Vitamin D  deficiency 06/13/2013   Medication management 06/13/2013   Hypertension    GERD (gastroesophageal reflux disease)    Kidney stones    Obesity (BMI 30.0-34.9)     Screening Tests Immunization History  Administered Date(s) Administered   Influenza Split 12/27/2012   Influenza, High Dose Seasonal PF 01/31/2014, 11/27/2014, 01/12/2017, 01/13/2018, 12/02/2021, 12/10/2022   Influenza-Unspecified 12/16/2016, 01/12/2017   PFIZER(Purple Top)SARS-COV-2 Vaccination 04/19/2019, 05/10/2019, 01/31/2020, 09/16/2020   Pneumococcal Conjugate-13 03/25/2015   Pneumococcal Polysaccharide-23 03/02/2008   Td 03/02/2005, 09/24/2015   Unspecified SARS-COV-2 Vaccination 01/11/2023   Zoster, Live 03/02/2006   Health Maintenance  Topic Date Due   Zoster Vaccines- Shingrix (1 of 2) 04/22/2023 (Originally 02/07/1960)   COVID-19 Vaccine (6 - 2023-24 season) 03/08/2023   HEMOGLOBIN A1C  04/09/2023   OPHTHALMOLOGY EXAM  07/22/2023   Diabetic kidney evaluation - eGFR measurement  10/07/2023   Diabetic kidney evaluation - Urine ACR  10/07/2023   FOOT EXAM  10/07/2023   Medicare Annual Wellness (AWV)  01/20/2024   DTaP/Tdap/Td (3 - Tdap) 09/23/2025   Pneumonia Vaccine 67+ Years old  Completed   INFLUENZA VACCINE  Completed   HPV VACCINES  Aged Out   Colonoscopy  Discontinued   Hepatitis C Screening  Discontinued     Names of Other Physician/Practitioners you currently use: 1. [facility] here for primary care 2. Dr. Dione Booze, eye doctor, last visit 07/2022 3. Dr. Lennice Sites, dentist, last visit 11/2022  Patient Care Team: Lucky Cowboy, MD as PCP - General (Internal Medicine) Nadara Mustard, MD as Consulting Physician (Orthopedic Surgery) Louis Meckel, MD (Inactive) as Consulting Physician (Gastroenterology) Marcine Matar, MD as Consulting Physician (Urology) Sundra Aland, [facility] (Inactive) as Pharmacist (Pharmacist)  Allergies Allergies  Allergen Reactions    Iodinated Contrast Media Shortness Of Breath   Iohexol Shortness Of Breath   Statins     Myalgia, with several per patient    SURGICAL HISTORY He  has a past surgical history that includes tumor ear (Left, age 59); Cystoscopy w/ ureteroscopy w/ lithotripsy (Left, 05/04/2005); Colonoscopy (02/17/2005); Nephrolithotomy (Left, 02/01/2014); Parathyroidectomy (Left, 07/10/2014); CYSTO/  LEFT RETROGRADE PYELOGRAM/ STENT PLACEMENT (01/26/2005); Esophagogastroduodenoscopy (egd) with esophageal dilation (x4  last one 07-21-2010); Inguinal hernia repair (Left, yrs ago); Cystoscopy/retrograde/ureteroscopy/stone extraction with basket (Left, 12/30/2015); Cystoscopy with stent placement (Left, 12/30/2015); Holmium laser application (Left, 12/30/2015); Cystoscopy/ureteroscopy/holmium laser/stent placement (Left, 03/09/2016); Total hip arthroplasty (Left, 06/24/2016); Total hip revision (Left, 11/18/2016); ORIF hip fracture (Left, 04/16/2017); Lumbar laminectomy/decompression microdiscectomy (Left, 11/09/2017); Back surgery (04/2018); Posterior lumbar fusion (09/07/2019); Cataract extraction (Bilateral, 2022); and Esophageal manometry (N/A, 04/09/2021). FAMILY HISTORY His family history includes Alzheimer's disease in his mother; Bladder Cancer in his sister; Diabetes in his sister; Heart disease in his father and mother. SOCIAL HISTORY He  reports that he quit smoking about 48 years ago. His smoking use included cigarettes. He started smoking about 56 years ago. He has never used smokeless tobacco. He reports current alcohol use of about 1.0 - 2.0 standard drink of alcohol per week. He reports that he does not use drugs.  MEDICARE WELLNESS OBJECTIVES: Physical activity:  Occasional walking Cardiac risk factors: Cardiac Risk Factors include: advanced age (>28men, >43 women);diabetes mellitus;dyslipidemia;hypertension;male  gender;obesity (BMI >30kg/m2);sedentary lifestyle;smoking/ tobacco exposure Depression/mood  screen:      01/20/2023   11:25 AM  Depression screen PHQ 2/9  Decreased Interest 0  Down, Depressed, Hopeless 0  PHQ - 2 Score 0    ADLs:     01/20/2023   11:24 AM  In your present state of health, do you have any difficulty performing the following activities:  Hearing? 0  Vision? 0  Difficulty concentrating or making decisions? 0  Walking or climbing stairs? 1  Dressing or bathing? 0  Doing errands, shopping? 0  Preparing Food and eating ? N  Using the Toilet? N  In the past six months, have you accidently leaked urine? N  Do you have problems with loss of bowel control? N  Managing your Medications? N  Managing your Finances? N  Housekeeping or managing your Housekeeping? N     Cognitive Testing  Alert? Yes  Normal Appearance?Yes  Oriented to person? Yes  Place? Yes   Time? Yes  Recall of three objects?  Yes  Can perform simple calculations? Yes  Displays appropriate judgment?Yes  Can read the correct time from a watch face?Yes  EOL planning: Does Patient Have a Medical Advance Directive?: Yes Type of Advance Directive: Healthcare Power of Attorney, Living will Does patient want to make changes to medical advance directive?: No - Patient declined Copy of Healthcare Power of Attorney in Chart?: No - copy requested   Objective:   Today's Vitals   01/20/23 1051  BP: 120/64  Pulse: 74  Temp: 97.7 F (36.5 C)  SpO2: 95%  Weight: 238 lb 9.6 oz (108.2 kg)  Height: 5\' 10"  (1.778 m)      Body mass index is 34.24 kg/m.  General appearance: morbidly obese male who is alert and in no apparent distress HEENT: normocephalic, sclerae anicteric, TMs pearly, nares patent, no discharge or erythema, pharynx normal Oral cavity: MMM, no lesions, crowded mouth Neck: supple, large neck circumference, no lymphadenopathy, no thyromegaly, no masses Heart: RRR, normal S1, S2, no murmurs Lungs: CTA bilaterally, no wheezes, rhonchi, or rales Abdomen: +bs, soft, obese, non  tender Musculoskeletal: nontender, no swelling, no obvious deformity, antalgic gait with cane Extremities: no edema, no cyanosis, no clubbing Pulses: 2+ symmetric, upper and lower extremities, normal cap refill Neurological: alert, oriented x 3, CN2-12 intact, strength normal upper extremities, weakness in bilateral lower extremities no cerebellar signs, gait antalgic with cane. Normal sensation to extremities bilaterally Psychiatric: normal affect, behavior normal, pleasant   Medicare Attestation I have personally reviewed: The patient's medical and social history Their use of alcohol, tobacco or illicit drugs Their current medications and supplements The patient's functional ability including ADLs,fall risks, home safety risks, cognitive, and hearing and visual impairment Diet and physical activities Evidence for depression or mood disorders  The patient's weight, height, BMI, and visual acuity have been recorded in the chart.  I have made referrals, counseling, and provided education to the patient based on review of the above and I have provided the patient with a written personalized care plan for preventive services.     Raynelle Dick, NP   01/20/2023

## 2023-01-20 ENCOUNTER — Encounter: Payer: Self-pay | Admitting: Nurse Practitioner

## 2023-01-20 ENCOUNTER — Ambulatory Visit (INDEPENDENT_AMBULATORY_CARE_PROVIDER_SITE_OTHER): Payer: Medicare Other | Admitting: Nurse Practitioner

## 2023-01-20 VITALS — BP 120/64 | HR 74 | Temp 97.7°F | Ht 70.0 in | Wt 238.6 lb

## 2023-01-20 DIAGNOSIS — N521 Erectile dysfunction due to diseases classified elsewhere: Secondary | ICD-10-CM

## 2023-01-20 DIAGNOSIS — I10 Essential (primary) hypertension: Secondary | ICD-10-CM

## 2023-01-20 DIAGNOSIS — E1169 Type 2 diabetes mellitus with other specified complication: Secondary | ICD-10-CM

## 2023-01-20 DIAGNOSIS — N200 Calculus of kidney: Secondary | ICD-10-CM | POA: Diagnosis not present

## 2023-01-20 DIAGNOSIS — Z0001 Encounter for general adult medical examination with abnormal findings: Secondary | ICD-10-CM | POA: Diagnosis not present

## 2023-01-20 DIAGNOSIS — N401 Enlarged prostate with lower urinary tract symptoms: Secondary | ICD-10-CM

## 2023-01-20 DIAGNOSIS — E785 Hyperlipidemia, unspecified: Secondary | ICD-10-CM | POA: Diagnosis not present

## 2023-01-20 DIAGNOSIS — E559 Vitamin D deficiency, unspecified: Secondary | ICD-10-CM | POA: Diagnosis not present

## 2023-01-20 DIAGNOSIS — Z79899 Other long term (current) drug therapy: Secondary | ICD-10-CM

## 2023-01-20 DIAGNOSIS — E538 Deficiency of other specified B group vitamins: Secondary | ICD-10-CM

## 2023-01-20 DIAGNOSIS — N183 Chronic kidney disease, stage 3 unspecified: Secondary | ICD-10-CM | POA: Diagnosis not present

## 2023-01-20 DIAGNOSIS — M48062 Spinal stenosis, lumbar region with neurogenic claudication: Secondary | ICD-10-CM | POA: Diagnosis not present

## 2023-01-20 DIAGNOSIS — E119 Type 2 diabetes mellitus without complications: Secondary | ICD-10-CM

## 2023-01-20 DIAGNOSIS — E210 Primary hyperparathyroidism: Secondary | ICD-10-CM

## 2023-01-20 DIAGNOSIS — Z860101 Personal history of adenomatous and serrated colon polyps: Secondary | ICD-10-CM

## 2023-01-20 DIAGNOSIS — E1122 Type 2 diabetes mellitus with diabetic chronic kidney disease: Secondary | ICD-10-CM | POA: Diagnosis not present

## 2023-01-20 DIAGNOSIS — I700 Atherosclerosis of aorta: Secondary | ICD-10-CM

## 2023-01-20 DIAGNOSIS — N1832 Chronic kidney disease, stage 3b: Secondary | ICD-10-CM

## 2023-01-20 DIAGNOSIS — R6889 Other general symptoms and signs: Secondary | ICD-10-CM | POA: Diagnosis not present

## 2023-01-20 DIAGNOSIS — M1612 Unilateral primary osteoarthritis, left hip: Secondary | ICD-10-CM | POA: Diagnosis not present

## 2023-01-20 DIAGNOSIS — N138 Other obstructive and reflux uropathy: Secondary | ICD-10-CM

## 2023-01-20 DIAGNOSIS — K219 Gastro-esophageal reflux disease without esophagitis: Secondary | ICD-10-CM

## 2023-01-20 DIAGNOSIS — Z0000 Encounter for general adult medical examination without abnormal findings: Secondary | ICD-10-CM

## 2023-01-20 DIAGNOSIS — E669 Obesity, unspecified: Secondary | ICD-10-CM

## 2023-01-20 NOTE — Patient Instructions (Signed)

## 2023-01-21 LAB — LIPID PANEL
Cholesterol: 189 mg/dL (ref <200)
HDL: 56 mg/dL (ref >=40)
LDL Cholesterol (Calc): 110 mg/dL — ABNORMAL HIGH
Non-HDL Cholesterol (Calc): 133 mg/dL — ABNORMAL HIGH (ref <130)
Total CHOL/HDL Ratio: 3.4 (calc) (ref <5.0)
Triglycerides: 119 mg/dL (ref <150)

## 2023-01-21 LAB — CBC WITH DIFFERENTIAL/PLATELET
Absolute Lymphocytes: 1329 {cells}/uL (ref 850–3900)
Absolute Monocytes: 533 {cells}/uL (ref 200–950)
Basophils Absolute: 7 {cells}/uL (ref 0–200)
Basophils Relative: 0.1 %
Eosinophils Absolute: 183 {cells}/uL (ref 15–500)
Eosinophils Relative: 2.5 %
HCT: 44.5 % (ref 38.5–50.0)
Hemoglobin: 14.7 g/dL (ref 13.2–17.1)
MCH: 30.8 pg (ref 27.0–33.0)
MCHC: 33 g/dL (ref 32.0–36.0)
MCV: 93.3 fL (ref 80.0–100.0)
MPV: 10.1 fL (ref 7.5–12.5)
Monocytes Relative: 7.3 %
Neutro Abs: 5249 {cells}/uL (ref 1500–7800)
Neutrophils Relative %: 71.9 %
Platelets: 348 10*3/uL (ref 140–400)
RBC: 4.77 10*6/uL (ref 4.20–5.80)
RDW: 12.7 % (ref 11.0–15.0)
Total Lymphocyte: 18.2 %
WBC: 7.3 10*3/uL (ref 3.8–10.8)

## 2023-01-21 LAB — COMPLETE METABOLIC PANEL WITH GFR
AG Ratio: 1.6 (calc) (ref 1.0–2.5)
ALT: 19 U/L (ref 9–46)
AST: 28 U/L (ref 10–35)
Albumin: 4.3 g/dL (ref 3.6–5.1)
Alkaline phosphatase (APISO): 43 U/L (ref 35–144)
BUN/Creatinine Ratio: 12 (calc) (ref 6–22)
BUN: 18 mg/dL (ref 7–25)
CO2: 25 mmol/L (ref 20–32)
Calcium: 9.5 mg/dL (ref 8.6–10.3)
Chloride: 101 mmol/L (ref 98–110)
Creat: 1.49 mg/dL — ABNORMAL HIGH (ref 0.70–1.28)
Globulin: 2.7 g/dL (ref 1.9–3.7)
Glucose, Bld: 125 mg/dL — ABNORMAL HIGH (ref 65–99)
Potassium: 4.5 mmol/L (ref 3.5–5.3)
Sodium: 136 mmol/L (ref 135–146)
Total Bilirubin: 0.7 mg/dL (ref 0.2–1.2)
Total Protein: 7 g/dL (ref 6.1–8.1)
eGFR: 47 mL/min/{1.73_m2} — ABNORMAL LOW (ref >=60)

## 2023-01-21 LAB — HEMOGLOBIN A1C W/OUT EAG: Hgb A1c MFr Bld: 6 %{Hb} — ABNORMAL HIGH (ref <5.7)

## 2023-02-04 DIAGNOSIS — L280 Lichen simplex chronicus: Secondary | ICD-10-CM | POA: Diagnosis not present

## 2023-03-17 ENCOUNTER — Ambulatory Visit: Payer: Medicare Other | Admitting: Internal Medicine

## 2023-03-30 ENCOUNTER — Other Ambulatory Visit: Payer: Self-pay | Admitting: Nurse Practitioner

## 2023-03-31 DIAGNOSIS — M5416 Radiculopathy, lumbar region: Secondary | ICD-10-CM | POA: Diagnosis not present

## 2023-03-31 DIAGNOSIS — Z9889 Other specified postprocedural states: Secondary | ICD-10-CM | POA: Diagnosis not present

## 2023-03-31 DIAGNOSIS — M2170 Unequal limb length (acquired), unspecified site: Secondary | ICD-10-CM | POA: Diagnosis not present

## 2023-03-31 DIAGNOSIS — R29898 Other symptoms and signs involving the musculoskeletal system: Secondary | ICD-10-CM | POA: Diagnosis not present

## 2023-03-31 DIAGNOSIS — M48062 Spinal stenosis, lumbar region with neurogenic claudication: Secondary | ICD-10-CM | POA: Diagnosis not present

## 2023-04-19 DIAGNOSIS — Z961 Presence of intraocular lens: Secondary | ICD-10-CM | POA: Diagnosis not present

## 2023-04-19 DIAGNOSIS — E119 Type 2 diabetes mellitus without complications: Secondary | ICD-10-CM | POA: Diagnosis not present

## 2023-04-19 DIAGNOSIS — D3132 Benign neoplasm of left choroid: Secondary | ICD-10-CM | POA: Diagnosis not present

## 2023-04-26 ENCOUNTER — Ambulatory Visit: Payer: Medicare Other | Admitting: Internal Medicine

## 2023-04-30 ENCOUNTER — Other Ambulatory Visit: Payer: Self-pay

## 2023-04-30 DIAGNOSIS — K219 Gastro-esophageal reflux disease without esophagitis: Secondary | ICD-10-CM

## 2023-04-30 MED ORDER — PANTOPRAZOLE SODIUM 40 MG PO TBEC: DELAYED_RELEASE_TABLET | ORAL | 0 refills | Status: DC

## 2023-05-06 ENCOUNTER — Encounter: Payer: Self-pay | Admitting: *Deleted

## 2023-06-22 ENCOUNTER — Other Ambulatory Visit: Payer: Self-pay | Admitting: Family Medicine

## 2023-06-22 ENCOUNTER — Ambulatory Visit (INDEPENDENT_AMBULATORY_CARE_PROVIDER_SITE_OTHER): Payer: Medicare Other | Admitting: Family Medicine

## 2023-06-22 ENCOUNTER — Encounter: Payer: Self-pay | Admitting: Family Medicine

## 2023-06-22 VITALS — BP 136/86 | HR 53 | Ht 70.0 in | Wt 222.1 lb

## 2023-06-22 DIAGNOSIS — N1831 Chronic kidney disease, stage 3a: Secondary | ICD-10-CM | POA: Diagnosis not present

## 2023-06-22 DIAGNOSIS — Z7689 Persons encountering health services in other specified circumstances: Secondary | ICD-10-CM | POA: Diagnosis not present

## 2023-06-22 DIAGNOSIS — E1122 Type 2 diabetes mellitus with diabetic chronic kidney disease: Secondary | ICD-10-CM

## 2023-06-22 DIAGNOSIS — N183 Chronic kidney disease, stage 3 unspecified: Secondary | ICD-10-CM

## 2023-06-22 MED ORDER — MOUNJARO 5 MG/0.5ML ~~LOC~~ SOAJ: 5.0000 mg | SUBCUTANEOUS | 2 refills | Status: AC

## 2023-06-22 NOTE — Progress Notes (Unsigned)
  Subjective:  - Chronic pain since 05/2016. Reports pain in foot, hip, back, rotator cuff. Pain daily, tolerable with meds.  - Foot: Arthritis. Dx'd as bunion initially. Wears deck shoes. Declined surgery.  - Hip: Hip replacement. Requires x-rays q4-6 months.  - Back: 4 back surgeries. Scheduled for 5th but postponing.  - Rotator cuff: Torn. Trying to exercise it.  - Bruising: Easy bruising with sharp pain when bumping into things.  - Dysphagia: Reports difficulty swallowing. Had esophageal stretching "number of times to no avail."  - Oral lesion: New growth in mouth.  - Neck: Reports lump.  - Leg: Boil that "got a little bigger."    Past Medical History:  - DM2: Last A1c 6.0% (01/2023). Reports recent home glucose readings ~200.  - CKD: Previously attributed to NSAID use. Reports "clean bill of health" from nephrology.  - Hyperlipidemia  - HTN  - Nephrolithiasis  - Hyperparathyroidism  - BPH: On finasteride   - Multiple orthopedic issues: Hip replacement, multiple back surgeries, foot arthritis, rotator cuff tear    Objective:  - VS: Not obtained  - Physical exam: Lungs clear. Oral exam performed. Neck exam performed.    Assessment:  - Chronic pain syndrome  - DM2, controlled  - CKD, stable per patient report  - Hyperlipidemia  - HTN  - BPH  - Oral lesion - possibly fibroma rather than cyst  - Dysphagia    Plan:  - Mounjaro : New Rx. Currently using q3-4 weeks (reduced from weekly per previous provider).  - Labs: Check in 3 months  - Aspirin : Continue to hold if bruising improves; restart if no improvement  - Pain management: Continue current regimen. Suggested trying TENS unit again.  - Follow-up: Schedule 27-month follow-up with morning lab draw  - Dermatology: Continuing with established care for skin lesions  - Transfer care: Will transfer all medications from previous provider

## 2023-06-22 NOTE — Progress Notes (Unsigned)
 New Patient Office Visit  Subjective   Patient ID: [name] [name], male    DOB: [birth year]  Age: 83 y.o. MRN: [id number]  CC:  Chief Complaint  Patient presents with   New Patient (Initial Visit)    HPI [name] [name] presents to establish care Dr. Cassondra Cliff pt  Subjective: - Chronic pain since 05/2016. Reports pain in foot, hip, back, rotator cuff. Pain daily, tolerable with meds. - Foot: Arthritis. Dx'd as bunion initially. Wears deck shoes. Declined surgery. - Hip: Hip replacement. Requires x-rays q4-6 months. - Back: 4 back surgeries. Scheduled for 5th but postponing. - Rotator cuff: Torn. Trying to exercise it. - Bruising: Easy bruising with sharp pain when bumping into things. - Dysphagia: Reports difficulty swallowing. Had esophageal stretching "number of times to no avail." - Oral lesion: New growth in mouth. - Neck: Reports lump. - Leg: Boil that "got a little bigger."  Past Medical History: - DM2: Last A1c 6.0% (01/2023). Reports recent home glucose readings ~200. - CKD: Previously attributed to NSAID use. Reports "clean bill of health" from nephrology. - Hyperlipidemia - HTN - Nephrolithiasis - Hyperparathyroidism - BPH: On finasteride  - Multiple orthopedic issues: Hip replacement, multiple back surgeries, foot arthritis, rotator cuff tear  PMH: CKD 3, DM 2, chronic back pain, hyperparathyroidism, HLD, HTN, kidney stones, statin intolerance,  PSH: THA left, lumbar fusion, parathyroidectomy, laminectomy, hernia repair, cystoscopy, colonoscopy, cataracts  FH: Diabetes-sister, Alzheimer's-mother, heart disease-mother and father, bladder cancer-sister   Outpatient Encounter Medications as of 06/22/2023  Medication Sig   bisoprolol -hydrochlorothiazide  (ZIAC ) 5-6.25 MG tablet Take  1 tablet  every Morning for BP                                                    /                                                                   TAKE                                          BY                                                 MOUTH                         ONCE ? ? ? DAILY   cholecalciferol (VITAMIN D3) 25 MCG (1000 UNIT) tablet Take 1,000 Units by mouth daily.   fenofibrate  (TRICOR ) 145 MG tablet TAKE 1 TABLET BY MOUTH EVERY DAY FOR TRIGLYCERIDES   finasteride  (PROSCAR ) 5 MG tablet Take 5 mg by mouth daily.   gabapentin  (NEURONTIN ) 300 MG capsule Take 300 mg by mouth 2 (two) times daily.   hydrocortisone  2.5 % cream Apply topically.   losartan  (COZAAR ) 100 MG tablet TAKE 1 TABLET BY MOUTH DAILY  FOR BLOOD PRESSURE   magnesium  oxide (MAG-OX) 400 MG tablet Take 400 mg by mouth daily.   Omega-3 Fatty Acids (FISH OIL ) 1200 MG CAPS Restart on 10.11   oxyCODONE -acetaminophen  (PERCOCET/ROXICET) 5-325 MG tablet Take 1 tablet by mouth every 6 (six) hours as needed.   pantoprazole  (PROTONIX ) 40 MG tablet TAKE 1 TABLET BY MOUTH TWICE A DAY FOR INDIGESTION AND ACID REFLUX   tamsulosin  (FLOMAX ) 0.4 MG CAPS capsule Take 0.4 mg by mouth daily after breakfast.    tirzepatide  (MOUNJARO ) 5 MG/0.5ML Pen Inject 5 mg into the skin once a week.   [DISCONTINUED] aspirin  81 MG chewable tablet Every other day   [DISCONTINUED] b complex vitamins tablet Take 1 tablet by mouth daily. (Patient not taking: Reported on 01/20/2023)   [DISCONTINUED] MOUNJARO  5 MG/0.5ML Pen INJECT 1 PEN (5 MG) INTO SKIN EVERY 7 DAYS FOR DIABETES   No facility-administered encounter medications on file as of 06/22/2023.    Past Medical History:  Diagnosis Date   Arthritis    in left hip   ED (erectile dysfunction)    Enlarged prostate with lower urinary tract symptoms (LUTS)    GERD (gastroesophageal reflux disease)    History of chronic gastritis    History of colon polyps    hyperplastic 2006   History of esophageal stricture    S/P  DILATATION 2009; 2010; 2010 2012   History of kidney stones    hx. multiple kidney stones   History of kidney stones    History of primary hyperparathyroidism    s/p   left superior parathyroidectomy 07-10-2014   Hyperlipidemia    Hypertension    Ileus (HCC) 05/02/2017   Left ureteral stone    Pre-diabetes    Sepsis secondary to UTI (HCC) 05/02/2017   Stricture and stenosis of esophagus 10/18/2008    Past Surgical History:  Procedure Laterality Date   BACK SURGERY  04/2018   fusion L2 and L3   CATARACT EXTRACTION Bilateral 2022   Dr. Eulogio Hidalgo   COLONOSCOPY  02/17/2005   CYSTO/  LEFT RETROGRADE PYELOGRAM/ STENT PLACEMENT  01/26/2005   CYSTOSCOPY W/ URETEROSCOPY W/ LITHOTRIPSY Left 05/04/2005   CYSTOSCOPY WITH STENT PLACEMENT Left 12/30/2015   Procedure: CYSTOSCOPY WITH STENT PLACEMENT;  Surgeon: Trent Frizzle, MD;  [facility];  Service: Urology;  Laterality: Left;   CYSTOSCOPY/RETROGRADE/URETEROSCOPY/STONE EXTRACTION WITH BASKET Left 12/30/2015   Procedure: CYSTOSCOPY/RETROGRADE/URETEROSCOPY/STONE EXTRACTION WITH BASKET;  Surgeon: Trent Frizzle, MD;  Location: [facility];  Service: Urology;  Laterality: Left;   CYSTOSCOPY/URETEROSCOPY/HOLMIUM LASER/STENT PLACEMENT Left 03/09/2016   Procedure: CYSTOSCOPY/RETROGRADE PYELOGRAM/URETEROSCOPY/BASKET STONE EXTRACTION/STENT PLACEMENT;  Surgeon: Trent Frizzle, MD;  Location: [facility];  Service: Urology;  Laterality: Left;   ESOPHAGEAL MANOMETRY N/A 04/09/2021   Procedure: ESOPHAGEAL MANOMETRY (EM);  Surgeon: Nannette Babe, MD;  Location: [facility];  Service: Gastroenterology;  Laterality: N/A;   ESOPHAGOGASTRODUODENOSCOPY (EGD) WITH ESOPHAGEAL DILATION  x4  last one 07-21-2010   HOLMIUM LASER APPLICATION Left 12/30/2015   Procedure: HOLMIUM LASER APPLICATION;  Surgeon: Trent Frizzle, MD;  Location: [facility];  Service: Urology;  Laterality: Left;   INGUINAL HERNIA REPAIR Left yrs ago   LUMBAR LAMINECTOMY/DECOMPRESSION MICRODISCECTOMY Left 11/09/2017   Procedure: Left Lumbar Two-Three Microdiscectomy;  Surgeon: Elna Haggis, MD;  Location:  [facility];  Service: Neurosurgery;  Laterality: Left;  Left Lumbar Two-Three Microdiscectomy   NEPHROLITHOTOMY Left 02/01/2014   Procedure: NEPHROLITHOTOMY PERCUTANEOUS;  Surgeon: Roque Collar, MD;  Location: [facility];  Service: Urology;  Laterality: Left;   ORIF HIP FRACTURE Left 04/16/2017   Procedure: OPEN REDUCTION INTERNAL FIXATION HIP GREATER TROCHANTER;  Surgeon: Wendolyn Hamburger, MD;  Location: [facility];  Service: Orthopedics;  Laterality: Left;   PARATHYROIDECTOMY Left 07/10/2014   Procedure: LEFT SUPERIOR PARATHYROIDECTOMY;  Surgeon: Oralee Billow, MD;  Location: [facility] OR;  Service: General;  Laterality: Left;   POSTERIOR LUMBAR FUSION  09/07/2019   TOTAL HIP ARTHROPLASTY Left 06/24/2016   Procedure: LEFT TOTAL HIP ARTHROPLASTY ANTERIOR APPROACH;  Surgeon: Liliane Rei, MD;  Location: [facility];  Service: Orthopedics;  Laterality: Left;   TOTAL HIP REVISION Left 11/18/2016   Procedure: Left femoral revision - posterior approach;  Surgeon: Liliane Rei, MD;  Location: [facility];  Service: Orthopedics;  Laterality: Left;   tumor ear Left age 74    topical growth behind left ear.    Family History  Problem Relation Age of Onset   Heart disease Mother    Alzheimer's disease Mother    Heart disease Father    Bladder Cancer Sister        ??   Diabetes Sister    Colon cancer Neg Hx    Esophageal cancer Neg Hx    Stomach cancer Neg Hx    Rectal cancer Neg Hx     Social History   Socioeconomic History   Marital status: Married    Spouse name: Not on file   Number of children: 2   Years of education: Not on file   Highest education level: Not on file  Occupational History   Occupation: real estate business  Tobacco Use   Smoking status: Former    Current packs/day: 0.00    Types: Cigarettes    Start date: 03/02/1966    Quit date: 03/02/1974    Years since quitting: 49.3   Smokeless tobacco: Never  Vaping Use   Vaping status: Never Used  Substance and Sexual Activity   Alcohol  use: Yes     Alcohol /week: 1.0 - 2.0 standard drink of alcohol     Types: 1 - 2 Glasses of wine per week    Comment: occasional   Drug use: No   Sexual activity: Yes  Other Topics Concern   Not on file  Social History Narrative   Not on file   Social Drivers of Health   Financial Resource Strain: Not on file  Food Insecurity: No Food Insecurity (09/22/2019)   Hunger Vital Sign    Worried About Running Out of Food in the Last Year: Never true    Ran Out of Food in the Last Year: Never true  Transportation Needs: No Transportation Needs (09/22/2019)   PRAPARE - Administrator, Civil Service (Medical): No    Lack of Transportation (Non-Medical): No  Physical Activity: Not on file  Stress: Not on file  Social Connections: Not on file  Intimate Partner Violence: Not on file    ROS     Objective   BP 136/86   Pulse (!) 53   Ht 5\' 10"  (1.778 m)   Wt 222 lb 1.9 oz (100.8 kg)   SpO2 97%   BMI 31.87 kg/m   Physical Exam General: Alert, oriented HEENT: PERRLA, EOMI, moist mucosa CV: Regular Pulmonary pulm clear bilaterally GI: Soft, normal bowel sounds MSK: Strength equal bilaterally Skin: Warm and dry.  Nonfluctuant nodule 1.5 cm in diameter on the left dorsal wrist.     Assessment & Plan:   Encounter to establish care  CKD stage 3  due to type 2 diabetes mellitus (HCC) Assessment & Plan: Recheck creatinine prior to next visit   Type 2 diabetes mellitus with stage 3a chronic kidney disease, without long-term current use of insulin  (HCC) Assessment & Plan: Managed with Mounjaro .  5 mg.  A1c, UACR, foot exam, BMP at next visit.   Other orders -     Mounjaro ; Inject 5 mg into the skin once a week.  Dispense: 6 mL; Refill: 2    Return in about 3 months (around 09/21/2023) for DM, hld.   Laneta Pintos, MD

## 2023-06-22 NOTE — Assessment & Plan Note (Signed)
 Managed with Mounjaro .  5 mg.  A1c, UACR, foot exam, BMP at next visit.

## 2023-06-22 NOTE — Patient Instructions (Signed)
 It was nice to see you today,  We addressed the following topics today: -I am sending in a new prescription for your Mounjaro  - When you see the pharmacist at [facility] you can tell them you would like Dr. Melene Sportsman prescriptions to be transferred to me the next time they are refilled  Have a great day,  Etha Henle, MD

## 2023-06-22 NOTE — Assessment & Plan Note (Signed)
 Recheck creatinine prior to next visit

## 2023-07-21 ENCOUNTER — Other Ambulatory Visit: Payer: Self-pay | Admitting: Family

## 2023-07-21 DIAGNOSIS — K219 Gastro-esophageal reflux disease without esophagitis: Secondary | ICD-10-CM

## 2023-07-28 ENCOUNTER — Other Ambulatory Visit: Payer: Self-pay | Admitting: Family Medicine

## 2023-07-28 DIAGNOSIS — K219 Gastro-esophageal reflux disease without esophagitis: Secondary | ICD-10-CM

## 2023-07-28 MED ORDER — PANTOPRAZOLE SODIUM 40 MG PO TBEC: DELAYED_RELEASE_TABLET | ORAL | 1 refills | Status: DC

## 2023-08-12 ENCOUNTER — Other Ambulatory Visit: Payer: Self-pay

## 2023-08-12 MED ORDER — BISOPROLOL-HYDROCHLOROTHIAZIDE 5-6.25 MG PO TABS: 1.0000 | ORAL_TABLET | Freq: Every day | ORAL | 3 refills | Status: AC

## 2023-09-14 ENCOUNTER — Other Ambulatory Visit

## 2023-09-21 ENCOUNTER — Ambulatory Visit: Admitting: Family Medicine

## 2023-09-29 ENCOUNTER — Other Ambulatory Visit

## 2023-10-01 ENCOUNTER — Other Ambulatory Visit: Payer: Self-pay | Admitting: Family Medicine

## 2023-10-14 ENCOUNTER — Other Ambulatory Visit

## 2023-10-20 ENCOUNTER — Other Ambulatory Visit

## 2023-10-20 ENCOUNTER — Encounter: Payer: Medicare Other | Admitting: Internal Medicine

## 2023-10-21 ENCOUNTER — Ambulatory Visit: Admitting: Family Medicine

## 2023-11-08 ENCOUNTER — Other Ambulatory Visit: Payer: Self-pay | Admitting: *Deleted

## 2023-11-08 DIAGNOSIS — E1122 Type 2 diabetes mellitus with diabetic chronic kidney disease: Secondary | ICD-10-CM

## 2023-11-08 DIAGNOSIS — E210 Primary hyperparathyroidism: Secondary | ICD-10-CM

## 2023-11-08 DIAGNOSIS — I10 Essential (primary) hypertension: Secondary | ICD-10-CM

## 2023-11-08 DIAGNOSIS — E1169 Type 2 diabetes mellitus with other specified complication: Secondary | ICD-10-CM

## 2023-11-09 ENCOUNTER — Other Ambulatory Visit

## 2023-11-09 DIAGNOSIS — I10 Essential (primary) hypertension: Secondary | ICD-10-CM

## 2023-11-09 DIAGNOSIS — E210 Primary hyperparathyroidism: Secondary | ICD-10-CM

## 2023-11-09 DIAGNOSIS — E1169 Type 2 diabetes mellitus with other specified complication: Secondary | ICD-10-CM

## 2023-11-09 DIAGNOSIS — E1122 Type 2 diabetes mellitus with diabetic chronic kidney disease: Secondary | ICD-10-CM

## 2023-11-10 ENCOUNTER — Ambulatory Visit: Payer: Self-pay | Admitting: Family Medicine

## 2023-11-10 LAB — HEMOGLOBIN A1C
Est. average glucose Bld gHb Est-mCnc: 117 mg/dL
Hgb A1c MFr Bld: 5.7 % — ABNORMAL HIGH (ref 4.8–5.6)

## 2023-11-10 LAB — COMPREHENSIVE METABOLIC PANEL WITH GFR
ALT: 18 IU/L (ref 0–44)
AST: 27 IU/L (ref 0–40)
Albumin: 4.3 g/dL (ref 3.8–4.8)
Alkaline Phosphatase: 46 IU/L (ref 44–121)
BUN/Creatinine Ratio: 14 (ref 10–24)
BUN: 24 mg/dL (ref 8–27)
Bilirubin Total: 0.9 mg/dL (ref 0.0–1.2)
CO2: 25 mmol/L (ref 20–29)
Calcium: 9.8 mg/dL (ref 8.6–10.2)
Chloride: 98 mmol/L (ref 96–106)
Creatinine, Ser: 1.77 mg/dL — ABNORMAL HIGH (ref 0.76–1.27)
Globulin, Total: 2.8 g/dL (ref 1.5–4.5)
Glucose: 123 mg/dL — ABNORMAL HIGH (ref 70–99)
Potassium: 5 mmol/L (ref 3.5–5.2)
Sodium: 134 mmol/L (ref 134–144)
Total Protein: 7.1 g/dL (ref 6.0–8.5)
eGFR: 38 mL/min/1.73 — ABNORMAL LOW (ref >59)

## 2023-11-10 LAB — LIPID PANEL
Chol/HDL Ratio: 3.2 ratio (ref 0.0–5.0)
Cholesterol, Total: 177 mg/dL (ref 100–199)
HDL: 56 mg/dL (ref >39)
LDL Chol Calc (NIH): 108 mg/dL — ABNORMAL HIGH (ref 0–99)
Triglycerides: 69 mg/dL (ref 0–149)
VLDL Cholesterol Cal: 13 mg/dL (ref 5–40)

## 2023-11-10 LAB — TSH: TSH: 2.16 u[IU]/mL (ref 0.450–4.500)

## 2023-11-18 ENCOUNTER — Ambulatory Visit (INDEPENDENT_AMBULATORY_CARE_PROVIDER_SITE_OTHER): Admitting: Family Medicine

## 2023-11-18 ENCOUNTER — Ambulatory Visit: Admitting: Family Medicine

## 2023-11-18 ENCOUNTER — Encounter: Payer: Self-pay | Admitting: Family Medicine

## 2023-11-18 VITALS — BP 98/65 | HR 70 | Ht 70.0 in | Wt 215.0 lb

## 2023-11-18 DIAGNOSIS — E785 Hyperlipidemia, unspecified: Secondary | ICD-10-CM

## 2023-11-18 DIAGNOSIS — E1169 Type 2 diabetes mellitus with other specified complication: Secondary | ICD-10-CM | POA: Diagnosis not present

## 2023-11-18 DIAGNOSIS — M19071 Primary osteoarthritis, right ankle and foot: Secondary | ICD-10-CM | POA: Insufficient documentation

## 2023-11-18 DIAGNOSIS — N1831 Chronic kidney disease, stage 3a: Secondary | ICD-10-CM

## 2023-11-18 DIAGNOSIS — E1122 Type 2 diabetes mellitus with diabetic chronic kidney disease: Secondary | ICD-10-CM | POA: Diagnosis not present

## 2023-11-18 NOTE — Progress Notes (Unsigned)
   Established Patient Office Visit  Subjective   Patient ID: [name] [name], male    DOB: [birth year]  Age: 83 y.o. MRN: [id number]  Chief Complaint  Patient presents with   Medical Management of Chronic Issues    HPI  Subjective - Follow up for medication and lab review. Also here to discuss vaccinations and foot pain. - Reports concern for dehydration, previously told by phlebotomist that veins were difficult due to dehydration. Reports drinking four 16-ounce bottles of water  daily. - Reports right great toe pain, described as arthritis or a bunion. Pain is irritated by leather shoes. Has tried topical pain creams. Reports a history of neuropathy with pins and needles sensation years ago, but denies current numbness. - Had complicated cataract surgery in the past with subsequent laser adjustments, drops, creams, and masks, resulting in ongoing minor eye issues. Plans to follow up with Dr. Octavia for annual eye exam.  Medications Current medications include Tricor , finasteride , gabapentin , losartan , magnesium , fish oil , Percocet as needed (has not taken recently), Protonix , tamsulosin , bisoprolol /hydrochlorothiazide  (Ziac ), and Mounjaro . Reports spacing Mounjaro  injections to every two weeks, with no side effects.  PMH, PSH, FH, Social Hx PMHx: Hyperlipidemia, hypertension, type 2 diabetes mellitus, history of neuropathy, history of cataracts. PSHx: Complicated cataract surgery. Hip surgery. FH: Great-grandfather was a physician.  ROS Constitutional: Reports slow, steady weight loss. Eyes: Reports minor ongoing issues post-cataract surgery. Cardiovascular: Denies problems. Endocrine: Reports good blood sugar control. Musculoskeletal: Reports right great toe pain. Neurological: Denies current numbness or paresthesias in feet.    The ASCVD Risk score (Arnett DK, et al., 2019) failed to calculate for the following reasons:   The 2019 ASCVD risk score is only valid for ages 14 to  63  Health Maintenance Due  Topic Date Due   Zoster Vaccines- Shingrix (1 of 2) 02/[birth year]/1962   OPHTHALMOLOGY EXAM  07/22/2023   Influenza Vaccine  10/01/2023   Diabetic kidney evaluation - Urine ACR  10/07/2023   COVID-19 Vaccine (6 - Pfizer risk 2024-25 season) 11/01/2023      Objective:     BP 98/65   Pulse 70   Ht 5' 10 (1.778 m)   Wt 215 lb (97.5 kg)   SpO2 96%   BMI 30.85 kg/m    Physical Exam General: No acute distress. Lungs: Clear to auscultation bilaterally. MSK: Bunion noted on right great toe. Area is tender to palpation. Neuro: Sensation intact to monofilament testing on bilateral feet.   No results found for any visits on 11/18/23.      Assessment & Plan:   Type 2 diabetes mellitus with stage 3a chronic kidney disease, without long-term current use of insulin  (HCC) Assessment & Plan: A1c at goal. Foot exam performed today. Will come back for UACR.  Goes to [facility] for eye exams.   Orders: -     Microalbumin / creatinine urine ratio  Hyperlipidemia associated with type 2 diabetes mellitus (HCC) -     Microalbumin / creatinine urine ratio  Osteoarthritis of joint of toe of right foot Assessment & Plan: Pain is secondary to a bunion, consistent with arthritis. Examination reveals intact sensation. - Advised on conservative management including wearing wider shoes or shoes with a softer toe box. - May continue using topical analgesic creams. - May use bunion pads or cushions to prevent shoe friction.      Return in about 6 months (around 05/17/2024) for HTN, DM, hld.    [name] MARLA Slain, MD

## 2023-11-18 NOTE — Assessment & Plan Note (Addendum)
 Pain is secondary to a bunion, consistent with arthritis. Examination reveals intact sensation. - Advised on conservative management including wearing wider shoes or shoes with a softer toe box. - May continue using topical analgesic creams. - May use bunion pads or cushions to prevent shoe friction.

## 2023-11-18 NOTE — Assessment & Plan Note (Signed)
 A1c at goal. Foot exam performed today. Will come back for UACR.  Goes to [facility] for eye exams.

## 2023-11-18 NOTE — Patient Instructions (Addendum)
 It was nice to see you today,  We addressed the following topics today: -For your foot pain you could try putting cushions over the affected area or continue using topical creams. No changes to your medications today. - You can get the COVID and flu vaccine at any of the major pharmacies like [facility] or [facility].  You do not need the RSV vaccine again since you had 1 recently.  Have a great day,  Rolan Slain, MD

## 2023-11-23 LAB — MICROALBUMIN / CREATININE URINE RATIO
Creatinine, Urine: 149.6 mg/dL
Microalb/Creat Ratio: 6 mg/g{creat} (ref 0–29)
Microalbumin, Urine: 8.9 ug/mL

## 2023-12-07 ENCOUNTER — Other Ambulatory Visit: Payer: Self-pay | Admitting: Family Medicine

## 2024-01-21 ENCOUNTER — Ambulatory Visit: Payer: Medicare Other | Admitting: Nurse Practitioner

## 2024-01-22 ENCOUNTER — Other Ambulatory Visit: Payer: Self-pay | Admitting: Family Medicine

## 2024-01-22 DIAGNOSIS — K219 Gastro-esophageal reflux disease without esophagitis: Secondary | ICD-10-CM

## 2024-05-09 ENCOUNTER — Other Ambulatory Visit

## 2024-05-18 ENCOUNTER — Ambulatory Visit: Admitting: Family Medicine
